# Patient Record
Sex: Male | Born: 1935
Health system: Southern US, Community
[De-identification: ages and names within clinical notes are randomized; demographics above are authoritative.]

## PROBLEM LIST (undated history)

## (undated) ENCOUNTER — Emergency Department (HOSPITAL_COMMUNITY): Payer: Medicare Other

## (undated) DIAGNOSIS — S0990XA Unspecified injury of head, initial encounter: Secondary | ICD-10-CM

## (undated) DIAGNOSIS — F32A Depression, unspecified: Secondary | ICD-10-CM

## (undated) DIAGNOSIS — J301 Allergic rhinitis due to pollen: Secondary | ICD-10-CM

## (undated) DIAGNOSIS — I639 Cerebral infarction, unspecified: Secondary | ICD-10-CM

## (undated) DIAGNOSIS — M8580 Other specified disorders of bone density and structure, unspecified site: Secondary | ICD-10-CM

## (undated) DIAGNOSIS — L309 Dermatitis, unspecified: Secondary | ICD-10-CM

## (undated) DIAGNOSIS — R040 Epistaxis: Secondary | ICD-10-CM

## (undated) DIAGNOSIS — E039 Hypothyroidism, unspecified: Secondary | ICD-10-CM

## (undated) DIAGNOSIS — N529 Male erectile dysfunction, unspecified: Secondary | ICD-10-CM

## (undated) DIAGNOSIS — C801 Malignant (primary) neoplasm, unspecified: Secondary | ICD-10-CM

## (undated) DIAGNOSIS — G4700 Insomnia, unspecified: Secondary | ICD-10-CM

## (undated) DIAGNOSIS — M069 Rheumatoid arthritis, unspecified: Secondary | ICD-10-CM

## (undated) DIAGNOSIS — I10 Essential (primary) hypertension: Secondary | ICD-10-CM

## (undated) DIAGNOSIS — F329 Major depressive disorder, single episode, unspecified: Secondary | ICD-10-CM

## (undated) DIAGNOSIS — E785 Hyperlipidemia, unspecified: Secondary | ICD-10-CM

## (undated) HISTORY — PX: INGUINAL HERNIA REPAIR: SHX194

## (undated) HISTORY — DX: Essential (primary) hypertension: I10

## (undated) HISTORY — PX: COLONOSCOPY: SHX174

## (undated) HISTORY — DX: Epistaxis: R04.0

## (undated) HISTORY — DX: Allergic rhinitis due to pollen: J30.1

## (undated) HISTORY — DX: Hypothyroidism, unspecified: E03.9

## (undated) HISTORY — DX: Insomnia, unspecified: G47.00

## (undated) HISTORY — PX: COLON SURGERY: SHX602

## (undated) HISTORY — DX: Other specified disorders of bone density and structure, unspecified site: M85.80

## (undated) HISTORY — DX: Rheumatoid arthritis, unspecified: M06.9

## (undated) HISTORY — DX: Male erectile dysfunction, unspecified: N52.9

## (undated) HISTORY — DX: Malignant (primary) neoplasm, unspecified: C80.1

## (undated) HISTORY — DX: Dermatitis, unspecified: L30.9

---

## 1983-05-23 DIAGNOSIS — C801 Malignant (primary) neoplasm, unspecified: Secondary | ICD-10-CM

## 1983-05-23 HISTORY — DX: Malignant (primary) neoplasm, unspecified: C80.1

## 2001-10-20 ENCOUNTER — Encounter (INDEPENDENT_AMBULATORY_CARE_PROVIDER_SITE_OTHER): Payer: Self-pay | Admitting: *Deleted

## 2002-05-27 ENCOUNTER — Ambulatory Visit (HOSPITAL_COMMUNITY): Admission: RE | Admit: 2002-05-27 | Discharge: 2002-05-27 | Payer: Self-pay | Admitting: Surgery

## 2002-05-28 ENCOUNTER — Encounter: Payer: Self-pay | Admitting: Surgery

## 2002-05-28 ENCOUNTER — Ambulatory Visit (HOSPITAL_COMMUNITY): Admission: RE | Admit: 2002-05-28 | Discharge: 2002-05-28 | Payer: Self-pay | Admitting: Surgery

## 2002-06-05 ENCOUNTER — Encounter (INDEPENDENT_AMBULATORY_CARE_PROVIDER_SITE_OTHER): Payer: Self-pay | Admitting: *Deleted

## 2002-06-05 ENCOUNTER — Encounter (INDEPENDENT_AMBULATORY_CARE_PROVIDER_SITE_OTHER): Payer: Self-pay | Admitting: Specialist

## 2002-06-05 ENCOUNTER — Inpatient Hospital Stay (HOSPITAL_COMMUNITY): Admission: RE | Admit: 2002-06-05 | Discharge: 2002-06-11 | Payer: Self-pay | Admitting: Surgery

## 2002-06-08 ENCOUNTER — Encounter: Payer: Self-pay | Admitting: Surgery

## 2002-06-11 ENCOUNTER — Encounter (INDEPENDENT_AMBULATORY_CARE_PROVIDER_SITE_OTHER): Payer: Self-pay | Admitting: *Deleted

## 2002-07-21 ENCOUNTER — Encounter: Payer: Self-pay | Admitting: Surgery

## 2002-07-21 ENCOUNTER — Encounter (INDEPENDENT_AMBULATORY_CARE_PROVIDER_SITE_OTHER): Payer: Self-pay | Admitting: *Deleted

## 2002-07-21 ENCOUNTER — Ambulatory Visit (HOSPITAL_BASED_OUTPATIENT_CLINIC_OR_DEPARTMENT_OTHER): Admission: RE | Admit: 2002-07-21 | Discharge: 2002-07-21 | Payer: Self-pay | Admitting: Surgery

## 2002-08-18 ENCOUNTER — Encounter (INDEPENDENT_AMBULATORY_CARE_PROVIDER_SITE_OTHER): Payer: Self-pay | Admitting: *Deleted

## 2002-08-18 ENCOUNTER — Observation Stay (HOSPITAL_COMMUNITY): Admission: RE | Admit: 2002-08-18 | Discharge: 2002-08-19 | Payer: Self-pay | Admitting: Surgery

## 2003-02-17 ENCOUNTER — Ambulatory Visit (HOSPITAL_COMMUNITY): Admission: RE | Admit: 2003-02-17 | Discharge: 2003-02-17 | Payer: Self-pay | Admitting: Oncology

## 2003-02-17 ENCOUNTER — Encounter: Payer: Self-pay | Admitting: Oncology

## 2003-05-29 ENCOUNTER — Ambulatory Visit (HOSPITAL_COMMUNITY): Admission: RE | Admit: 2003-05-29 | Discharge: 2003-05-29 | Payer: Self-pay | Admitting: Oncology

## 2003-07-17 ENCOUNTER — Ambulatory Visit (HOSPITAL_COMMUNITY): Admission: RE | Admit: 2003-07-17 | Discharge: 2003-07-17 | Payer: Self-pay | Admitting: Surgery

## 2003-07-17 ENCOUNTER — Encounter (INDEPENDENT_AMBULATORY_CARE_PROVIDER_SITE_OTHER): Payer: Self-pay | Admitting: *Deleted

## 2003-11-19 ENCOUNTER — Ambulatory Visit (HOSPITAL_COMMUNITY): Admission: RE | Admit: 2003-11-19 | Discharge: 2003-11-19 | Payer: Self-pay | Admitting: Oncology

## 2003-12-22 ENCOUNTER — Ambulatory Visit (HOSPITAL_BASED_OUTPATIENT_CLINIC_OR_DEPARTMENT_OTHER): Admission: RE | Admit: 2003-12-22 | Discharge: 2003-12-22 | Payer: Self-pay | Admitting: Surgery

## 2004-01-09 ENCOUNTER — Encounter (INDEPENDENT_AMBULATORY_CARE_PROVIDER_SITE_OTHER): Payer: Self-pay | Admitting: *Deleted

## 2004-05-18 ENCOUNTER — Ambulatory Visit: Payer: Self-pay | Admitting: Oncology

## 2004-05-19 ENCOUNTER — Ambulatory Visit (HOSPITAL_COMMUNITY): Admission: RE | Admit: 2004-05-19 | Discharge: 2004-05-19 | Payer: Self-pay | Admitting: Oncology

## 2004-11-28 ENCOUNTER — Ambulatory Visit: Payer: Self-pay | Admitting: Oncology

## 2005-05-26 ENCOUNTER — Ambulatory Visit: Payer: Self-pay | Admitting: Oncology

## 2005-08-01 ENCOUNTER — Ambulatory Visit: Payer: Self-pay | Admitting: Internal Medicine

## 2005-09-08 ENCOUNTER — Ambulatory Visit: Payer: Self-pay | Admitting: Internal Medicine

## 2005-11-06 ENCOUNTER — Ambulatory Visit: Payer: Self-pay | Admitting: Internal Medicine

## 2005-11-10 ENCOUNTER — Ambulatory Visit: Payer: Self-pay | Admitting: Internal Medicine

## 2005-11-15 ENCOUNTER — Ambulatory Visit: Payer: Self-pay

## 2005-11-20 ENCOUNTER — Ambulatory Visit: Payer: Self-pay | Admitting: Oncology

## 2005-11-27 LAB — CEA: CEA: 2 ng/mL (ref 0.0–5.0)

## 2005-12-29 ENCOUNTER — Ambulatory Visit: Payer: Self-pay | Admitting: Internal Medicine

## 2006-01-08 ENCOUNTER — Ambulatory Visit: Payer: Self-pay

## 2006-01-08 ENCOUNTER — Encounter: Payer: Self-pay | Admitting: Cardiovascular Disease

## 2006-05-23 ENCOUNTER — Ambulatory Visit: Payer: Self-pay | Admitting: Oncology

## 2006-05-28 LAB — CEA: CEA: 1.9 ng/mL (ref 0.0–5.0)

## 2006-06-06 ENCOUNTER — Ambulatory Visit: Payer: Self-pay | Admitting: Internal Medicine

## 2006-06-08 ENCOUNTER — Ambulatory Visit: Payer: Self-pay | Admitting: Cardiovascular Disease

## 2006-06-21 ENCOUNTER — Ambulatory Visit: Payer: Self-pay | Admitting: Internal Medicine

## 2006-08-28 ENCOUNTER — Encounter: Payer: Self-pay | Admitting: Internal Medicine

## 2006-08-28 ENCOUNTER — Ambulatory Visit: Payer: Self-pay | Admitting: Internal Medicine

## 2006-08-28 DIAGNOSIS — J301 Allergic rhinitis due to pollen: Secondary | ICD-10-CM | POA: Insufficient documentation

## 2006-08-28 DIAGNOSIS — E039 Hypothyroidism, unspecified: Secondary | ICD-10-CM | POA: Insufficient documentation

## 2006-08-28 DIAGNOSIS — G4700 Insomnia, unspecified: Secondary | ICD-10-CM | POA: Insufficient documentation

## 2006-08-28 DIAGNOSIS — I10 Essential (primary) hypertension: Secondary | ICD-10-CM | POA: Insufficient documentation

## 2006-08-28 LAB — CONVERTED CEMR LAB
Cholesterol: 180 mg/dL (ref 0–200)
Direct LDL: 67.8 mg/dL
HDL: 36.2 mg/dL — ABNORMAL LOW (ref >39.0)
PSA: 2.7 ng/mL (ref 0.10–4.00)
TSH: 1.15 microintl units/mL (ref 0.35–5.50)
Total CHOL/HDL Ratio: 5
Triglycerides: 437 mg/dL (ref 0–149)
VLDL: 87 mg/dL — ABNORMAL HIGH (ref 0–40)

## 2006-10-08 ENCOUNTER — Ambulatory Visit (HOSPITAL_COMMUNITY): Admission: RE | Admit: 2006-10-08 | Discharge: 2006-10-08 | Payer: Self-pay | Admitting: Surgery

## 2006-10-08 ENCOUNTER — Encounter (INDEPENDENT_AMBULATORY_CARE_PROVIDER_SITE_OTHER): Payer: Self-pay | Admitting: *Deleted

## 2006-10-08 LAB — HM COLONOSCOPY: HM Colonoscopy: NORMAL

## 2006-10-09 ENCOUNTER — Encounter: Payer: Self-pay | Admitting: Internal Medicine

## 2006-10-09 ENCOUNTER — Ambulatory Visit: Payer: Self-pay | Admitting: Internal Medicine

## 2006-10-09 DIAGNOSIS — R252 Cramp and spasm: Secondary | ICD-10-CM | POA: Insufficient documentation

## 2006-10-18 ENCOUNTER — Telehealth (INDEPENDENT_AMBULATORY_CARE_PROVIDER_SITE_OTHER): Payer: Self-pay | Admitting: *Deleted

## 2006-11-21 ENCOUNTER — Ambulatory Visit: Payer: Self-pay | Admitting: Oncology

## 2006-11-26 LAB — CEA: CEA: 1.6 ng/mL (ref 0.0–5.0)

## 2006-11-27 ENCOUNTER — Encounter: Payer: Self-pay | Admitting: Internal Medicine

## 2006-12-07 ENCOUNTER — Ambulatory Visit: Payer: Self-pay | Admitting: Internal Medicine

## 2006-12-07 DIAGNOSIS — L259 Unspecified contact dermatitis, unspecified cause: Secondary | ICD-10-CM | POA: Insufficient documentation

## 2007-01-02 ENCOUNTER — Telehealth (INDEPENDENT_AMBULATORY_CARE_PROVIDER_SITE_OTHER): Payer: Self-pay | Admitting: *Deleted

## 2007-01-07 ENCOUNTER — Telehealth: Payer: Self-pay | Admitting: Internal Medicine

## 2007-01-08 ENCOUNTER — Telehealth (INDEPENDENT_AMBULATORY_CARE_PROVIDER_SITE_OTHER): Payer: Self-pay | Admitting: *Deleted

## 2007-02-12 ENCOUNTER — Encounter: Payer: Self-pay | Admitting: Internal Medicine

## 2007-02-19 ENCOUNTER — Encounter: Payer: Self-pay | Admitting: Internal Medicine

## 2007-02-22 ENCOUNTER — Ambulatory Visit: Payer: Self-pay | Admitting: Internal Medicine

## 2007-02-22 DIAGNOSIS — F528 Other sexual dysfunction not due to a substance or known physiological condition: Secondary | ICD-10-CM | POA: Insufficient documentation

## 2007-04-03 ENCOUNTER — Ambulatory Visit: Payer: Self-pay | Admitting: Internal Medicine

## 2007-04-08 ENCOUNTER — Ambulatory Visit: Payer: Self-pay | Admitting: Internal Medicine

## 2007-04-15 ENCOUNTER — Encounter: Payer: Self-pay | Admitting: Family Medicine

## 2007-04-23 ENCOUNTER — Ambulatory Visit: Payer: Self-pay | Admitting: Internal Medicine

## 2007-04-25 LAB — CONVERTED CEMR LAB
BUN: 9 mg/dL (ref 6–23)
CO2: 29 meq/L (ref 19–32)
Calcium: 9.4 mg/dL (ref 8.4–10.5)
Chloride: 93 meq/L — ABNORMAL LOW (ref 96–112)
Creatinine, Ser: 1.3 mg/dL (ref 0.4–1.5)
GFR calc Af Amer: 69 mL/min
GFR calc non Af Amer: 57 mL/min
Glucose, Bld: 133 mg/dL — ABNORMAL HIGH (ref 70–99)
Potassium: 4 meq/L (ref 3.5–5.1)
Sodium: 133 meq/L — ABNORMAL LOW (ref 135–145)

## 2007-05-28 ENCOUNTER — Ambulatory Visit: Payer: Self-pay | Admitting: Oncology

## 2007-05-30 LAB — CEA: CEA: 1.9 ng/mL (ref 0.0–5.0)

## 2007-06-05 ENCOUNTER — Encounter: Payer: Self-pay | Admitting: Internal Medicine

## 2007-06-27 ENCOUNTER — Ambulatory Visit: Payer: Self-pay | Admitting: Internal Medicine

## 2007-06-28 LAB — CONVERTED CEMR LAB
Cholesterol: 181 mg/dL (ref 0–200)
Direct LDL: 74 mg/dL
HDL: 39 mg/dL (ref >39.0)
TSH: 3.53 microintl units/mL (ref 0.35–5.50)
Total CHOL/HDL Ratio: 4.6
Triglycerides: 294 mg/dL (ref 0–149)
VLDL: 59 mg/dL — ABNORMAL HIGH (ref 0–40)

## 2007-07-26 ENCOUNTER — Telehealth (INDEPENDENT_AMBULATORY_CARE_PROVIDER_SITE_OTHER): Payer: Self-pay | Admitting: *Deleted

## 2007-07-26 ENCOUNTER — Ambulatory Visit: Payer: Self-pay | Admitting: Family Medicine

## 2007-07-26 DIAGNOSIS — R040 Epistaxis: Secondary | ICD-10-CM | POA: Insufficient documentation

## 2007-07-30 ENCOUNTER — Encounter: Payer: Self-pay | Admitting: Internal Medicine

## 2007-08-15 ENCOUNTER — Encounter: Payer: Self-pay | Admitting: Internal Medicine

## 2007-09-10 ENCOUNTER — Encounter: Payer: Self-pay | Admitting: Internal Medicine

## 2007-09-26 ENCOUNTER — Encounter: Payer: Self-pay | Admitting: Internal Medicine

## 2007-10-23 ENCOUNTER — Telehealth (INDEPENDENT_AMBULATORY_CARE_PROVIDER_SITE_OTHER): Payer: Self-pay | Admitting: *Deleted

## 2007-10-24 ENCOUNTER — Encounter (INDEPENDENT_AMBULATORY_CARE_PROVIDER_SITE_OTHER): Payer: Self-pay | Admitting: *Deleted

## 2007-10-30 ENCOUNTER — Ambulatory Visit: Payer: Self-pay | Admitting: Internal Medicine

## 2007-10-31 LAB — CONVERTED CEMR LAB
BUN: 13 mg/dL (ref 6–23)
CO2: 31 meq/L (ref 19–32)
Calcium: 9.5 mg/dL (ref 8.4–10.5)
Chloride: 101 meq/L (ref 96–112)
Creatinine, Ser: 1.1 mg/dL (ref 0.4–1.5)
GFR calc Af Amer: 83 mL/min
GFR calc non Af Amer: 69 mL/min
Glucose, Bld: 89 mg/dL (ref 70–99)
Potassium: 4.5 meq/L (ref 3.5–5.1)
Sodium: 139 meq/L (ref 135–145)
TSH: 0.95 microintl units/mL (ref 0.35–5.50)

## 2007-11-15 ENCOUNTER — Telehealth: Payer: Self-pay | Admitting: Internal Medicine

## 2007-11-25 ENCOUNTER — Ambulatory Visit: Payer: Self-pay | Admitting: Oncology

## 2007-12-05 LAB — CEA: CEA: 1.7 ng/mL (ref 0.0–5.0)

## 2008-01-07 ENCOUNTER — Telehealth (INDEPENDENT_AMBULATORY_CARE_PROVIDER_SITE_OTHER): Payer: Self-pay | Admitting: *Deleted

## 2008-01-10 ENCOUNTER — Ambulatory Visit: Payer: Self-pay | Admitting: Internal Medicine

## 2008-02-26 ENCOUNTER — Ambulatory Visit: Payer: Self-pay | Admitting: Internal Medicine

## 2008-02-28 ENCOUNTER — Ambulatory Visit: Payer: Self-pay | Admitting: Internal Medicine

## 2008-03-02 ENCOUNTER — Ambulatory Visit: Payer: Self-pay | Admitting: Internal Medicine

## 2008-03-03 ENCOUNTER — Telehealth (INDEPENDENT_AMBULATORY_CARE_PROVIDER_SITE_OTHER): Payer: Self-pay | Admitting: *Deleted

## 2008-05-20 ENCOUNTER — Ambulatory Visit: Payer: Self-pay | Admitting: Internal Medicine

## 2008-05-27 ENCOUNTER — Ambulatory Visit: Payer: Self-pay | Admitting: Oncology

## 2008-05-29 ENCOUNTER — Encounter: Payer: Self-pay | Admitting: Internal Medicine

## 2008-05-29 LAB — CEA: CEA: 2 ng/mL (ref 0.0–5.0)

## 2008-07-17 ENCOUNTER — Ambulatory Visit: Payer: Self-pay | Admitting: Internal Medicine

## 2008-07-23 ENCOUNTER — Ambulatory Visit: Payer: Self-pay | Admitting: Internal Medicine

## 2008-07-23 DIAGNOSIS — E781 Pure hyperglyceridemia: Secondary | ICD-10-CM | POA: Insufficient documentation

## 2008-07-28 ENCOUNTER — Telehealth (INDEPENDENT_AMBULATORY_CARE_PROVIDER_SITE_OTHER): Payer: Self-pay | Admitting: *Deleted

## 2008-07-28 ENCOUNTER — Ambulatory Visit: Payer: Self-pay | Admitting: Internal Medicine

## 2008-07-28 ENCOUNTER — Encounter: Payer: Self-pay | Admitting: Internal Medicine

## 2008-07-28 LAB — CONVERTED CEMR LAB
ALT: 68 units/L — ABNORMAL HIGH (ref 0–53)
AST: 46 units/L — ABNORMAL HIGH (ref 0–37)
BUN: 16 mg/dL (ref 6–23)
Basophils Absolute: 0.1 10*3/uL (ref 0.0–0.1)
Basophils Relative: 0.7 % (ref 0.0–3.0)
CO2: 32 meq/L (ref 19–32)
Calcium: 9.5 mg/dL (ref 8.4–10.5)
Chloride: 103 meq/L (ref 96–112)
Cholesterol: 210 mg/dL (ref 0–200)
Creatinine, Ser: 1.2 mg/dL (ref 0.4–1.5)
Direct LDL: 96.6 mg/dL
Eosinophils Absolute: 0.2 10*3/uL (ref 0.0–0.7)
Eosinophils Relative: 2.3 % (ref 0.0–5.0)
GFR calc Af Amer: 75 mL/min
GFR calc non Af Amer: 62 mL/min
Glucose, Bld: 89 mg/dL (ref 70–99)
HCT: 44.8 % (ref 39.0–52.0)
HDL: 36.7 mg/dL — ABNORMAL LOW (ref >39.0)
Hemoglobin: 15.5 g/dL (ref 13.0–17.0)
Lymphocytes Relative: 23.9 % (ref 12.0–46.0)
MCHC: 34.6 g/dL (ref 30.0–36.0)
MCV: 89.3 fL (ref 78.0–100.0)
Monocytes Absolute: 0.8 10*3/uL (ref 0.1–1.0)
Monocytes Relative: 8.8 % (ref 3.0–12.0)
Neutro Abs: 5.7 10*3/uL (ref 1.4–7.7)
Neutrophils Relative %: 64.3 % (ref 43.0–77.0)
PSA: 2.61 ng/mL (ref 0.10–4.00)
Platelets: 217 10*3/uL (ref 150–400)
Potassium: 4.4 meq/L (ref 3.5–5.1)
RBC: 5.02 M/uL (ref 4.22–5.81)
RDW: 11.9 % (ref 11.5–14.6)
Sodium: 141 meq/L (ref 135–145)
TSH: 0.9 microintl units/mL (ref 0.35–5.50)
Total CHOL/HDL Ratio: 5.7
Triglycerides: 334 mg/dL (ref 0–149)
VLDL: 67 mg/dL — ABNORMAL HIGH (ref 0–40)
WBC: 8.9 10*3/uL (ref 4.5–10.5)

## 2008-07-29 ENCOUNTER — Ambulatory Visit: Payer: Self-pay

## 2008-07-29 ENCOUNTER — Encounter: Payer: Self-pay | Admitting: Internal Medicine

## 2008-08-19 ENCOUNTER — Telehealth: Payer: Self-pay | Admitting: Internal Medicine

## 2008-08-19 ENCOUNTER — Ambulatory Visit: Payer: Self-pay | Admitting: Internal Medicine

## 2008-08-25 LAB — CONVERTED CEMR LAB
ALT: 21 units/L (ref 0–53)
AST: 22 units/L (ref 0–37)
Albumin: 3.6 g/dL (ref 3.5–5.2)
Alkaline Phosphatase: 68 units/L (ref 39–117)
Bilirubin, Direct: 0.1 mg/dL (ref 0.0–0.3)
Total Bilirubin: 0.7 mg/dL (ref 0.3–1.2)
Total Protein: 6.9 g/dL (ref 6.0–8.3)

## 2008-08-26 ENCOUNTER — Telehealth (INDEPENDENT_AMBULATORY_CARE_PROVIDER_SITE_OTHER): Payer: Self-pay | Admitting: *Deleted

## 2008-08-26 ENCOUNTER — Ambulatory Visit: Payer: Self-pay | Admitting: Internal Medicine

## 2008-08-26 DIAGNOSIS — M949 Disorder of cartilage, unspecified: Secondary | ICD-10-CM

## 2008-08-26 DIAGNOSIS — M899 Disorder of bone, unspecified: Secondary | ICD-10-CM | POA: Insufficient documentation

## 2008-11-19 ENCOUNTER — Ambulatory Visit (HOSPITAL_COMMUNITY): Admission: RE | Admit: 2008-11-19 | Discharge: 2008-11-19 | Payer: Self-pay | Admitting: Orthopedic Surgery

## 2008-12-04 ENCOUNTER — Ambulatory Visit: Payer: Self-pay | Admitting: Internal Medicine

## 2008-12-04 DIAGNOSIS — R498 Other voice and resonance disorders: Secondary | ICD-10-CM | POA: Insufficient documentation

## 2009-01-04 ENCOUNTER — Ambulatory Visit: Payer: Self-pay | Admitting: Internal Medicine

## 2009-01-04 DIAGNOSIS — R5381 Other malaise: Secondary | ICD-10-CM | POA: Insufficient documentation

## 2009-01-04 DIAGNOSIS — R221 Localized swelling, mass and lump, neck: Secondary | ICD-10-CM

## 2009-01-04 DIAGNOSIS — R220 Localized swelling, mass and lump, head: Secondary | ICD-10-CM | POA: Insufficient documentation

## 2009-01-04 DIAGNOSIS — R5383 Other fatigue: Secondary | ICD-10-CM

## 2009-01-06 ENCOUNTER — Encounter: Payer: Self-pay | Admitting: Internal Medicine

## 2009-01-07 LAB — CONVERTED CEMR LAB
BUN: 13 mg/dL (ref 6–23)
Basophils Absolute: 0 10*3/uL (ref 0.0–0.1)
Basophils Relative: 0.1 % (ref 0.0–3.0)
CO2: 28 meq/L (ref 19–32)
Calcium: 9 mg/dL (ref 8.4–10.5)
Chloride: 105 meq/L (ref 96–112)
Creatinine, Ser: 0.9 mg/dL (ref 0.4–1.5)
Eosinophils Absolute: 0.1 10*3/uL (ref 0.0–0.7)
Eosinophils Relative: 0.9 % (ref 0.0–5.0)
Folate: 14.9 ng/mL
GFR calc non Af Amer: 86.61 mL/min (ref >60)
Glucose, Bld: 79 mg/dL (ref 70–99)
HCT: 42.9 % (ref 39.0–52.0)
Hemoglobin: 14.1 g/dL (ref 13.0–17.0)
Lymphocytes Relative: 25.5 % (ref 12.0–46.0)
Lymphs Abs: 2.2 10*3/uL (ref 0.7–4.0)
MCHC: 32.9 g/dL (ref 30.0–36.0)
MCV: 90.4 fL (ref 78.0–100.0)
Monocytes Absolute: 0.8 10*3/uL (ref 0.1–1.0)
Monocytes Relative: 8.6 % (ref 3.0–12.0)
Neutro Abs: 5.7 10*3/uL (ref 1.4–7.7)
Neutrophils Relative %: 64.9 % (ref 43.0–77.0)
Platelets: 192 10*3/uL (ref 150.0–400.0)
Potassium: 3.9 meq/L (ref 3.5–5.1)
RBC: 4.75 M/uL (ref 4.22–5.81)
RDW: 12.3 % (ref 11.5–14.6)
Sodium: 139 meq/L (ref 135–145)
Vitamin B-12: 364 pg/mL (ref 211–911)
WBC: 8.8 10*3/uL (ref 4.5–10.5)

## 2009-01-18 ENCOUNTER — Telehealth (INDEPENDENT_AMBULATORY_CARE_PROVIDER_SITE_OTHER): Payer: Self-pay | Admitting: *Deleted

## 2009-01-19 ENCOUNTER — Telehealth (INDEPENDENT_AMBULATORY_CARE_PROVIDER_SITE_OTHER): Payer: Self-pay | Admitting: *Deleted

## 2009-01-26 ENCOUNTER — Telehealth (INDEPENDENT_AMBULATORY_CARE_PROVIDER_SITE_OTHER): Payer: Self-pay | Admitting: *Deleted

## 2009-02-03 ENCOUNTER — Encounter (INDEPENDENT_AMBULATORY_CARE_PROVIDER_SITE_OTHER): Payer: Self-pay | Admitting: *Deleted

## 2009-02-15 ENCOUNTER — Ambulatory Visit: Payer: Self-pay | Admitting: Internal Medicine

## 2009-04-05 ENCOUNTER — Ambulatory Visit: Payer: Self-pay | Admitting: Internal Medicine

## 2009-04-05 DIAGNOSIS — I059 Rheumatic mitral valve disease, unspecified: Secondary | ICD-10-CM | POA: Insufficient documentation

## 2009-05-28 ENCOUNTER — Ambulatory Visit: Payer: Self-pay | Admitting: Oncology

## 2009-05-28 ENCOUNTER — Encounter: Payer: Self-pay | Admitting: Internal Medicine

## 2009-05-28 LAB — CEA: CEA: 2.3 ng/mL (ref 0.0–5.0)

## 2009-08-19 ENCOUNTER — Telehealth (INDEPENDENT_AMBULATORY_CARE_PROVIDER_SITE_OTHER): Payer: Self-pay | Admitting: *Deleted

## 2009-09-01 ENCOUNTER — Encounter: Payer: Self-pay | Admitting: Internal Medicine

## 2009-09-13 ENCOUNTER — Ambulatory Visit (HOSPITAL_COMMUNITY): Admission: RE | Admit: 2009-09-13 | Discharge: 2009-09-13 | Payer: Self-pay | Admitting: Surgery

## 2009-10-22 ENCOUNTER — Telehealth (INDEPENDENT_AMBULATORY_CARE_PROVIDER_SITE_OTHER): Payer: Self-pay | Admitting: *Deleted

## 2009-11-15 ENCOUNTER — Ambulatory Visit: Payer: Self-pay | Admitting: Internal Medicine

## 2009-11-15 DIAGNOSIS — R05 Cough: Secondary | ICD-10-CM | POA: Insufficient documentation

## 2009-11-15 DIAGNOSIS — R059 Cough, unspecified: Secondary | ICD-10-CM | POA: Insufficient documentation

## 2009-11-15 LAB — CONVERTED CEMR LAB: Vit D, 25-Hydroxy: 43 ng/mL (ref 30–89)

## 2009-11-19 LAB — CONVERTED CEMR LAB
Folate: 12.6 ng/mL
PSA: 2.59 ng/mL (ref 0.10–4.00)
TSH: 1 microintl units/mL (ref 0.35–5.50)
Vitamin B-12: 273 pg/mL (ref 211–911)

## 2009-12-06 ENCOUNTER — Ambulatory Visit: Payer: Self-pay | Admitting: Internal Medicine

## 2009-12-14 LAB — CONVERTED CEMR LAB
BUN: 17 mg/dL (ref 6–23)
CO2: 31 meq/L (ref 19–32)
Calcium: 9.2 mg/dL (ref 8.4–10.5)
Chloride: 107 meq/L (ref 96–112)
Creatinine, Ser: 1 mg/dL (ref 0.4–1.5)
GFR calc non Af Amer: 76.51 mL/min (ref >60)
Glucose, Bld: 104 mg/dL — ABNORMAL HIGH (ref 70–99)
Potassium: 4.2 meq/L (ref 3.5–5.1)
Sodium: 140 meq/L (ref 135–145)

## 2010-02-01 ENCOUNTER — Ambulatory Visit: Payer: Self-pay | Admitting: Family Medicine

## 2010-02-14 ENCOUNTER — Telehealth (INDEPENDENT_AMBULATORY_CARE_PROVIDER_SITE_OTHER): Payer: Self-pay | Admitting: *Deleted

## 2010-02-17 ENCOUNTER — Telehealth: Payer: Self-pay | Admitting: Internal Medicine

## 2010-03-17 ENCOUNTER — Ambulatory Visit: Payer: Self-pay | Admitting: Internal Medicine

## 2010-03-17 LAB — CONVERTED CEMR LAB
Bilirubin Urine: NEGATIVE
Blood in Urine, dipstick: NEGATIVE
Glucose, Urine, Semiquant: NEGATIVE
Ketones, urine, test strip: NEGATIVE
Nitrite: NEGATIVE
Protein, U semiquant: NEGATIVE
Specific Gravity, Urine: 1.01
Urobilinogen, UA: 0.2
WBC Urine, dipstick: NEGATIVE
pH: 7.5

## 2010-03-23 LAB — CONVERTED CEMR LAB
ALT: 20 units/L (ref 0–53)
AST: 26 units/L (ref 0–37)
Albumin: 3.9 g/dL (ref 3.5–5.2)
Alkaline Phosphatase: 68 units/L (ref 39–117)
Amylase: 168 units/L — ABNORMAL HIGH (ref 27–131)
Basophils Absolute: 0 10*3/uL (ref 0.0–0.1)
Basophils Relative: 0.4 % (ref 0.0–3.0)
Bilirubin, Direct: 0.1 mg/dL (ref 0.0–0.3)
Eosinophils Absolute: 0.1 10*3/uL (ref 0.0–0.7)
Eosinophils Relative: 1.4 % (ref 0.0–5.0)
HCT: 43.5 % (ref 39.0–52.0)
Hemoglobin: 14.9 g/dL (ref 13.0–17.0)
Lipase: 38 units/L (ref 11.0–59.0)
Lymphocytes Relative: 23.9 % (ref 12.0–46.0)
Lymphs Abs: 2 10*3/uL (ref 0.7–4.0)
MCHC: 34.3 g/dL (ref 30.0–36.0)
MCV: 91 fL (ref 78.0–100.0)
Monocytes Absolute: 0.6 10*3/uL (ref 0.1–1.0)
Monocytes Relative: 7.5 % (ref 3.0–12.0)
Neutro Abs: 5.7 10*3/uL (ref 1.4–7.7)
Neutrophils Relative %: 66.8 % (ref 43.0–77.0)
Platelets: 210 10*3/uL (ref 150.0–400.0)
RBC: 4.78 M/uL (ref 4.22–5.81)
RDW: 12.6 % (ref 11.5–14.6)
TSH: 0.68 microintl units/mL (ref 0.35–5.50)
Total Bilirubin: 0.5 mg/dL (ref 0.3–1.2)
Total Protein: 6.8 g/dL (ref 6.0–8.3)
WBC: 8.5 10*3/uL (ref 4.5–10.5)

## 2010-04-01 ENCOUNTER — Encounter: Payer: Self-pay | Admitting: Internal Medicine

## 2010-04-01 ENCOUNTER — Telehealth: Payer: Self-pay | Admitting: Cardiovascular Disease

## 2010-04-04 ENCOUNTER — Ambulatory Visit: Payer: Self-pay

## 2010-04-04 ENCOUNTER — Encounter: Payer: Self-pay | Admitting: Internal Medicine

## 2010-04-08 ENCOUNTER — Ambulatory Visit: Payer: Self-pay | Admitting: Internal Medicine

## 2010-04-11 ENCOUNTER — Telehealth (INDEPENDENT_AMBULATORY_CARE_PROVIDER_SITE_OTHER): Payer: Self-pay | Admitting: *Deleted

## 2010-04-21 ENCOUNTER — Encounter: Payer: Self-pay | Admitting: Internal Medicine

## 2010-04-21 ENCOUNTER — Encounter (INDEPENDENT_AMBULATORY_CARE_PROVIDER_SITE_OTHER): Payer: Self-pay | Admitting: *Deleted

## 2010-05-18 ENCOUNTER — Ambulatory Visit: Payer: Self-pay | Admitting: Internal Medicine

## 2010-05-18 DIAGNOSIS — Z85038 Personal history of other malignant neoplasm of large intestine: Secondary | ICD-10-CM | POA: Insufficient documentation

## 2010-05-19 ENCOUNTER — Ambulatory Visit
Admission: RE | Admit: 2010-05-19 | Discharge: 2010-05-19 | Payer: Self-pay | Source: Home / Self Care | Attending: Internal Medicine | Admitting: Internal Medicine

## 2010-05-19 ENCOUNTER — Encounter: Payer: Self-pay | Admitting: Internal Medicine

## 2010-05-20 ENCOUNTER — Ambulatory Visit: Payer: Self-pay | Admitting: Oncology

## 2010-05-20 ENCOUNTER — Telehealth: Payer: Self-pay | Admitting: Internal Medicine

## 2010-05-24 ENCOUNTER — Telehealth: Payer: Self-pay | Admitting: Gastroenterology

## 2010-05-25 ENCOUNTER — Telehealth: Payer: Self-pay | Admitting: Internal Medicine

## 2010-06-11 ENCOUNTER — Encounter: Payer: Self-pay | Admitting: Oncology

## 2010-06-12 ENCOUNTER — Encounter: Payer: Self-pay | Admitting: Internal Medicine

## 2010-06-13 ENCOUNTER — Encounter: Payer: Self-pay | Admitting: Orthopedic Surgery

## 2010-06-19 LAB — CONVERTED CEMR LAB
ALT: 17 units/L (ref 0–40)
AST: 27 units/L (ref 0–37)
AST: 31 units/L (ref 0–37)
Albumin: 3.8 g/dL (ref 3.5–5.2)
Alkaline Phosphatase: 73 units/L (ref 39–117)
BUN: 11 mg/dL (ref 6–23)
BUN: 14 mg/dL (ref 6–23)
Basophils Absolute: 0.1 10*3/uL (ref 0.0–0.1)
Basophils Relative: 0.7 % (ref 0.0–3.0)
CO2: 29 meq/L (ref 19–32)
CO2: 31 meq/L (ref 19–32)
Calcium: 8.5 mg/dL (ref 8.4–10.5)
Calcium: 9.7 mg/dL (ref 8.4–10.5)
Chloride: 101 meq/L (ref 96–112)
Chloride: 102 meq/L (ref 96–112)
Cholesterol: 204 mg/dL — ABNORMAL HIGH (ref 0–200)
Creatinine, Ser: 1 mg/dL (ref 0.4–1.5)
Creatinine, Ser: 1.1 mg/dL (ref 0.4–1.5)
Direct LDL: 78.4 mg/dL
Eosinophils Absolute: 0.2 10*3/uL (ref 0.0–0.7)
Eosinophils Relative: 2.3 % (ref 0.0–5.0)
GFR calc Af Amer: 84 mL/min
GFR calc non Af Amer: 69 mL/min
GFR calc non Af Amer: 76.64 mL/min (ref >60)
Glucose, Bld: 91 mg/dL (ref 70–99)
Glucose, Bld: 95 mg/dL (ref 70–99)
HCT: 44.3 % (ref 39.0–52.0)
HCT: 46 % (ref 39.0–52.0)
HDL: 38.9 mg/dL — ABNORMAL LOW (ref >39.00)
Hemoglobin: 15.1 g/dL (ref 13.0–17.0)
Hemoglobin: 15.3 g/dL (ref 13.0–17.0)
Lymphocytes Relative: 21.8 % (ref 12.0–46.0)
Lymphs Abs: 1.8 10*3/uL (ref 0.7–4.0)
MCHC: 33.3 g/dL (ref 30.0–36.0)
MCHC: 34.1 g/dL (ref 30.0–36.0)
MCV: 89.4 fL (ref 78.0–100.0)
MCV: 91.4 fL (ref 78.0–100.0)
Monocytes Absolute: 0.6 10*3/uL (ref 0.1–1.0)
Monocytes Relative: 7.5 % (ref 3.0–12.0)
Neutro Abs: 5.4 10*3/uL (ref 1.4–7.7)
Neutrophils Relative %: 67.7 % (ref 43.0–77.0)
Platelets: 191 10*3/uL (ref 150.0–400.0)
Platelets: 252 10*3/uL (ref 150–400)
Potassium: 4 meq/L (ref 3.5–5.1)
Potassium: 4 meq/L (ref 3.5–5.1)
RBC: 4.85 M/uL (ref 4.22–5.81)
RBC: 5.15 M/uL (ref 4.22–5.81)
RDW: 12.2 % (ref 11.5–14.6)
RDW: 12.4 % (ref 11.5–14.6)
Sodium: 138 meq/L (ref 135–145)
Sodium: 140 meq/L (ref 135–145)
TSH: 0.89 microintl units/mL (ref 0.35–5.50)
TSH: 6.81 microintl units/mL — ABNORMAL HIGH (ref 0.35–5.50)
Total Bilirubin: 0.5 mg/dL (ref 0.3–1.2)
Total CHOL/HDL Ratio: 5
Total Protein: 6.7 g/dL (ref 6.0–8.3)
Triglycerides: 461 mg/dL — ABNORMAL HIGH (ref 0.0–149.0)
VLDL: 92.2 mg/dL — ABNORMAL HIGH (ref 0.0–40.0)
WBC: 8.1 10*3/uL (ref 4.5–10.5)
WBC: 9.7 10*3/uL (ref 4.5–10.5)

## 2010-06-21 NOTE — Assessment & Plan Note (Signed)
Summary: paperwork//tl   Vital Signs:  Patient Profile:   77 Years Old Male Height:     62 inches Weight:      13.6 pounds Pulse rate:   80 / minute BP sitting:   142 / 74  Vitals Entered By: Shary Decamp (February 22, 2007 9:15 AM)                 Chief Complaint:  ED; rf on mometasone cream.  History of Present Illness: Here for eval of ED long h/o ED Used Viagra: didn't like s/e Contacted by a company, offered a Vaccum device, he is willing to try  Current Allergies: No known allergies  Updated/Current Medications (including changes made in today's visit):  DIOVAN 160 MG TABS (VALSARTAN) Take 1 tablet by mouth once a day SYNTHROID 100 MCG TABS (LEVOTHYROXINE SODIUM) Take 1 tablet by mouth once a day NEXIUM 40 MG  CPDR (ESOMEPRAZOLE MAGNESIUM) per ent ZYRTEC ALLERGY 10 MG  TABS (CETIRIZINE HCL) per ent MULTIVITAMINS   TABS (MULTIPLE VITAMIN)  * ALLERCLEAR  * GLUCOSAMINE  * OSTO-BI-FLEX  * OSCAL/D  FLONASE 50 MCG/ACT  SUSP (FLUTICASONE PROPIONATE) 2 sprays each nostil once daily prn CLOBETASOL PROPIONATE 0.05 %  CREA (CLOBETASOL PROPIONATE) apply for 10 days to both hands      Review of Systems       no cp no claudication   Physical Exam  General:     alert and well-developed.   Abdomen:     no bruit Pulses:     nl. femoral pulses Extremities:     no edema Skin:     hand eczema improved    Impression & Recommendations:  Problem # 1:  ERECTILE DYSFUNCTION (ICD-302.72) ok to Rx device  Problem # 2:  ECZEMA (ICD-692.9) rf His updated medication list for this problem includes:    Zyrtec Allergy 10 Mg Tabs (Cetirizine hcl) .Marland Kitchen... Per ent    Clobetasol Propionate 0.05 % Crea (Clobetasol propionate) .Marland Kitchen... Apply for 10 days to both hands   Problem # 3:  HYPERTENSION (ICD-401.9)  His updated medication list for this problem includes:    Diovan 160 Mg Tabs (Valsartan) .Marland Kitchen... Take 1 tablet by mouth once a day  BP today: 142/74 Prior BP: 150/90  (12/07/2006)  Labs Reviewed: Creat: 1.1 (06/06/2006) Chol: 180 (08/28/2006)   HDL: 36.2 (08/28/2006)   LDL: DEL (08/28/2006)   TG: 437 (08/28/2006)   Problem # 4:  HYPOTHYROIDISM (ICD-244.9)  His updated medication list for this problem includes:    Synthroid 100 Mcg Tabs (Levothyroxine sodium) .Marland Kitchen... Take 1 tablet by mouth once a day  Labs Reviewed: TSH: 1.15 (08/28/2006)    Chol: 180 (08/28/2006)   HDL: 36.2 (08/28/2006)   LDL: DEL (08/28/2006)   TG: 437 (08/28/2006)   Complete Medication List: 1)  Diovan 160 Mg Tabs (Valsartan) .... Take 1 tablet by mouth once a day 2)  Synthroid 100 Mcg Tabs (Levothyroxine sodium) .... Take 1 tablet by mouth once a day 3)  Nexium 40 Mg Cpdr (Esomeprazole magnesium) .... Per ent 4)  Zyrtec Allergy 10 Mg Tabs (Cetirizine hcl) .... Per ent 5)  Multivitamins Tabs (Multiple vitamin) 6)  Allerclear  7)  Glucosamine  8)  Osto-bi-flex  9)  Oscal/d  10)  Flonase 50 Mcg/act Susp (Fluticasone propionate) .... 2 sprays each nostil once daily prn 11)  Clobetasol Propionate 0.05 % Crea (Clobetasol propionate) .... Apply for 10 days to both hands   Patient Instructions: 1)  ask the company to fax another form for the vaccum 2)  use the cream for 2 more weeks, if the eczema came back we will send you to a dermatologist 3)  you are due for a routine office visit    Prescriptions: CLOBETASOL PROPIONATE 0.05 %  CREA (CLOBETASOL PROPIONATE) apply for 10 days to both hands  #1 x 0   Entered and Authorized by:   Nolon Rod. Tyee Vandevoorde MD   Signed by:   Nolon Rod. Benney Sommerville MD on 02/22/2007   Method used:   Print then Give to Patient   RxID:   [id number]  ]

## 2010-06-21 NOTE — Assessment & Plan Note (Signed)
Summary: a crack in both hands//tl  Medications Added DIOVAN 160 MG TABS (VALSARTAN) Take 1 tablet by mouth once a day SYNTHROID 100 MCG TABS (LEVOTHYROXINE SODIUM) Take 1 tablet by mouth once a day NEXIUM 40 MG  CPDR (ESOMEPRAZOLE MAGNESIUM) per ent ZYRTEC ALLERGY 10 MG  TABS (CETIRIZINE HCL) per ent MULTIVITAMINS   TABS (MULTIPLE VITAMIN)  * ALLERCLEAR  * GLUCOSAMINE  * OSTO-BI-FLEX  * OSCAL/D  FLONASE 50 MCG/ACT  SUSP (FLUTICASONE PROPIONATE) 2 sprays each nostil once daily prn CLOBETASOL PROPIONATE 0.05 %  CREA (CLOBETASOL PROPIONATE) apply for 10 days to both hands      Allergies Added: NKDA  Vital Signs:  Patient Profile:   77 Years Old Male Height:     62 inches Weight:      130 pounds Temp:     98.4 degrees F BP sitting:   150 / 90  (left arm) Cuff size:   regular  Vitals Entered By: Shary Decamp (December 07, 2006 11:52 AM)               Chief Complaint:  hands peeling.  History of Present Illness: rash on both hands for four weeks.  Denies any itching, some pain.  also complained all left groin and suprapubic pain, pain this started after he was examined at the cancer Center and they "push " that area  Current Allergies (reviewed today): No known allergies  Updated/Current Medications (including changes made in today's visit):  DIOVAN 160 MG TABS (VALSARTAN) Take 1 tablet by mouth once a day SYNTHROID 100 MCG TABS (LEVOTHYROXINE SODIUM) Take 1 tablet by mouth once a day NEXIUM 40 MG  CPDR (ESOMEPRAZOLE MAGNESIUM) per ent ZYRTEC ALLERGY 10 MG  TABS (CETIRIZINE HCL) per ent MULTIVITAMINS   TABS (MULTIPLE VITAMIN)  * ALLERCLEAR  * GLUCOSAMINE  * OSTO-BI-FLEX  * OSCAL/D  FLONASE 50 MCG/ACT  SUSP (FLUTICASONE PROPIONATE) 2 sprays each nostil once daily prn CLOBETASOL PROPIONATE 0.05 %  CREA (CLOBETASOL PROPIONATE) apply for 10 days to both hands      Review of Systems       do a lot of yard work sometimes uses gloves   Physical Exam  General:  alert and well-developed.   Abdomen:     groins are free of masses, no hernias detected.  No tenderness. Skin:     symmetric rash both hands affecting mostly the thumb and the third and fourth digit.  The skin is dry, peeling.  No redness no swelling no blisters.    Impression & Recommendations:  Problem # 1:  ECZEMA (ICD-692.9) on no new medicines, he did use Singulair several weeks ago.  he is not currently taking Singulair. Trial with topical creams. His updated medication list for this problem includes:    Zyrtec Allergy 10 Mg Tabs (Cetirizine hcl) .Marland Kitchen... Per ent    Clobetasol Propionate 0.05 % Crea (Clobetasol propionate) .Marland Kitchen... Apply for 10 days to both hands   Medications Added to Medication List This Visit: 1)  Diovan 160 Mg Tabs (Valsartan) .... Take 1 tablet by mouth once a day 2)  Synthroid 100 Mcg Tabs (Levothyroxine sodium) .... Take 1 tablet by mouth once a day 3)  Nexium 40 Mg Cpdr (Esomeprazole magnesium) .... Per ent 4)  Zyrtec Allergy 10 Mg Tabs (Cetirizine hcl) .... Per ent 5)  Multivitamins Tabs (Multiple vitamin) 6)  Allerclear  7)  Glucosamine  8)  Osto-bi-flex  9)  Oscal/d  10)  Flonase 50 Mcg/act Susp (Fluticasone propionate) .Marland KitchenMarland KitchenMarland Kitchen  2 sprays each nostil once daily prn 11)  Clobetasol Propionate 0.05 % Crea (Clobetasol propionate) .... Apply for 10 days to both hands   Patient Instructions: 1)  use the cream as prescribed for 10 days. 2)  If you are not getting better, please call us for dermatology referral.    Prescriptions: CLOBETASOL PROPIONATE 0.05 %  CREA (CLOBETASOL PROPIONATE) apply for 10 days to both hands  #1 x 0   Entered and Authorized by:   Nolon Rod. Makynlee Kressin MD   Signed by:   Nolon Rod. Devann Cribb MD on 12/07/2006   Method used:   Print then Give to Patient   RxID:   [id number]

## 2010-06-21 NOTE — Letter (Signed)
Summary: [facility]   Imported By: Lanelle Bal 12/21/2009 12:43:15  _____________________________________________________________________  External Attachment:    Type:   Image     Comment:   External Document

## 2010-06-21 NOTE — Assessment & Plan Note (Signed)
   Vital Signs:  Patient Profile:   77 Years Old Male Height:     62 inches Weight:      129.8 pounds Pulse rate:   58 / minute BP sitting:   120 / 80  (left arm) Cuff size:   regular  Vitals Entered By: Shary Decamp (Oct 09, 2006 8:12 AM)               Chief Complaint:  ROV; FASTING; C/O MUSCLE CRAMPS IN THE MORNING AND EVENING.  History of Present Illness: here to follow-up on his blood pressure. He also complained of allergies despite the use of Flonase and Zyrtec. He also complains of leg cramps mostly at night, at both calves       Review of Systems        no fever or green nasal discharge. He has tried stretching to help the cramps: no help he saw allergies are caractherize by sneezing and severe nasal itching   Physical Exam  General:     alert, well-developed, and well-nourished.   Head:     no pain to palpation of the sinuses Nose:     slightly congested Mouth:     no red or discharge    Impression & Recommendations:  Problem # 1:  HYPERTENSION (ICD-401.9)  well controlled, no change  Problem # 2:  HAY FEVER (ICD-477.0) see below  Problem # 3:  LEG CRAMPS (ICD-729.82) check a G6PD level if he is okay, will prescribe quinine as needed  Problem # 4:  follow-up in 6 months   Patient Instructions: 1)  use your Flonase every day. 2)  Continue with Zyrtec over-the-counter everyday. 3)  Please start Singulair  1 tablet a day.  This is also an allergy medication that should help you a lot.   4)  If you feel that Singulair helped, please fill the  prescription 5)  we are checking the  blood today to see if you can take  a leg cramp medicine.  Will call you soon with the results 6)  follow-up here in 6 months  Appended Document:   G6PD level is normal. Will call quinine 325 mg nightly p.r.n. #30 and 3 RF

## 2010-06-21 NOTE — Assessment & Plan Note (Signed)
Summary: FOLLOWUP ON MED/KN   Vital Signs:  Patient profile:   77 year old male Height:      62 inches Weight:      125.25 pounds BMI:     22.99 Pulse rate:   65 / minute Pulse rhythm:   regular BP sitting:   128 / 80  (left arm) Cuff size:   regular  Vitals Entered By: Army Fossa CMA (March 17, 2010 10:43 AM) CC: follow up visit-not fsating  Comments Pain in LLQ of abdomen x 2 months  Uses mail order  samples of nexium.     History of Present Illness:  follow up visit-not fasting  2 months history of left-sided abdominal pain, he points left from the umbilicus. The pain has been consistently postprandial, last about 30 minutes. No associated symptoms.  As far as his high blood pressure, his BP has  been below 100 sometimes, he has been taking only half of his medication for few days. BP today is within normal. Cough is resolved   Needs samples  of  Nexium  ROS Denies nausea, vomiting, or heartburn. bowel movements are regular, no blood in the stools Appetite is normal, some weight loss noticed  today no dysuria or gross hematuria       Allergies: No Known Drug Allergies  Past History:  Past Medical History: Hypertension Hypothyroidism EPISTAXIS-- f/u per ENT h/o ADENOCARCINOMA, COLON status post right hemicolectomy and  chemotherapy.  F/U with the cancer center and Dr. Ezzard Standing routinely. Had a Cscope Ezzard Standing) 2005 and 2008  INSOMNIA  ECZEMA  LEG CRAMPS  HAY FEVER   ERECTILE DYSFUNCTION  Osteopenia-- per DEXA 07-2008 (Rx fosamax)  Past Surgical History: Reviewed history from 08/28/2006 and no changes required. Inguinal herniorrhaphy  Social History: Reviewed history from 11/15/2009 and no changes required. original from [place] Married veggetarian two children tobacco--never ETOH--no  Review of Systems      See HPI  Physical Exam  General:  alert, well-developed, and well-nourished.   Neck:  normal bilateral carotid pulses Lungs:   normal respiratory effort, no intercostal retractions, no accessory muscle use, and normal breath sounds.   Heart:  normal rate, regular rhythm, no murmur, and no gallop.   Abdomen:  soft, normal bowel sounds, and no distention. he has a palpable aorta in the upper abdomen, no bruit; specifically the left side of the abdomen is soft and nontender.  No organomegaly.  Pulses:  normal femur pulses   Impression & Recommendations:  Problem # 1:  ABDOMINAL PAIN (ICD-789.00) Assessment New 2 months history of postprandial abdominal pain on exam he has a palpable aorta, he also has a history of colon cancer We'll start her workup with an aorta and a Mesenteric duplex ultrasonography  intestinal angina? if workup negative, will proceed with a CT or GI consult. reassess in  3 weeks, patient will call if symptoms rsevere  Orders: Venipuncture (04540) TLB-Hepatic/Liver Function Pnl (80076-HEPATIC) TLB-CBC Platelet - w/Differential (85025-CBCD) TLB-Amylase (82150-AMYL) TLB-Lipase (83690-LIPASE) Radiology Referral (Radiology)  Problem # 2:  HYPERTENSION (ICD-401.9) patient reports low blood pressure with 1 losartan, he self decreased to half tablet and apparently doing well also ,cough is resolved , likely was related to ACE inhibitors plan: Decrease losartan to half tablet  His updated medication list for this problem includes:    Losartan Potassium-hctz 50-12.5 Mg Tabs (Losartan potassium-hctz) ..... Half tablet daily  Problem # 3:  HYPOTHYROIDISM (ICD-244.9) labs  His updated medication list for this problem includes:  Synthroid 100 Mcg Tabs (Levothyroxine sodium) .Marland Kitchen... Take 1 tablet by mouth once a day  Orders: TLB-TSH (Thyroid Stimulating Hormone) (84443-TSH)  Labs Reviewed: TSH: 1.00 (11/15/2009)    Chol: 204 (04/05/2009)   HDL: 38.90 (04/05/2009)   LDL: DEL (07/23/2008)   TG: 461.0 (04/05/2009)  Complete Medication List: 1)  Losartan Potassium-hctz 50-12.5 Mg Tabs (Losartan  potassium-hctz) .... Half tablet daily 2)  Synthroid 100 Mcg Tabs (Levothyroxine sodium) .... Take 1 tablet by mouth once a day 3)  Nexium 40 Mg Cpdr (Esomeprazole magnesium) .... Per ent 4)  Kls Aller-tec 10 Mg Tabs (Cetirizine hcl) .... As needed 5)  Flonase 50 Mcg/act Susp (Fluticasone propionate) .... 2 sprays each nostil once daily prn 6)  Fosamax 70 Mg Tabs (Alendronate sodium) .Marland Kitchen.. 1 tablet a week 7)  Multivitamins Tabs (Multiple vitamin) 8)  Oscal 500/200 D-3 500-200 Mg-unit Tabs (Calcium-vitamin d) .Marland Kitchen.. 1 by mouth daily 9)  Omega-3  .... Take once daily 10)  Viagra 100 Mg Tabs (Sildenafil citrate) .... 1/2 or 1 tab no more often than once a day  Patient Instructions: 1)  the only half tablet of losartan daily, call  if your BP is consistently more than 140-85 2)  Please schedule a follow-up appointment in 3  weeks.    Orders Added: 1)  Venipuncture [36415] 2)  TLB-Hepatic/Liver Function Pnl [80076-HEPATIC] 3)  TLB-CBC Platelet - w/Differential [85025-CBCD] 4)  TLB-Amylase [82150-AMYL] 5)  TLB-Lipase [83690-LIPASE] 6)  TLB-TSH (Thyroid Stimulating Hormone) [84443-TSH] 7)  Radiology Referral [Radiology] 8)  Est. Patient Level IV [41324]    Laboratory Results   Urine Tests    Routine Urinalysis   Color: yellow Appearance: Clear Glucose: negative   (Normal Range: Negative) Bilirubin: negative   (Normal Range: Negative) Ketone: negative   (Normal Range: Negative) Spec. Gravity: 1.010   (Normal Range: 1.003-1.035) Blood: negative   (Normal Range: Negative) pH: 7.5   (Normal Range: 5.0-8.0) Protein: negative   (Normal Range: Negative) Urobilinogen: 0.2   (Normal Range: 0-1) Nitrite: negative   (Normal Range: Negative) Leukocyte Esterace: negative   (Normal Range: Negative)    Comments: Army Fossa CMA  March 17, 2010 10:56 AM

## 2010-06-21 NOTE — Progress Notes (Signed)
Summary: chest pain - dr Drue Novel   Phone Note Call from Patient Call back at [facility] Phone [phone] Call back at [phone]   Caller: Patient Complaint: Chest Pain Summary of Call: patient is having chest pain x's 5 days - little pain right arm  Initial call taken by: Okey Regal [place],  November 15, 2007 10:53 AM  Follow-up for Phone Call        Spoke with patient -- he is having chest pain, SOB, sweating.  Advised patient to go to the ED.  Patient states he wants to come here to see Dr. Drue Novel.  Advised patient that he really needs to go to ED.  Patient hung up on me.............Marland KitchenShary Decamp  November 15, 2007 11:01 AM  called [phone] -- spoke with translator [phone] -- she contacted patient and advised him that he needs to go to ED.  Patient was upset that we contacted translator -- he states he is not having chest pain but is having chest pressure.....advised patient (via translator) that it could still be cardiac....needs eval at ED..........Marland Kitchenpatient states he will not go to the ED and is upset because we refuse to see him...Marland KitchenMarland KitchenMarland Kitchenhe is upset that we contacted translator...Marland KitchenMarland KitchenMarland Kitchenhe states "I am not having a heart attack".....advised patient that we are not refusing to see him but if he is having chest pain/pressure our protocool is that he needs eval @ ED.  Pt said thank you & hung up .......................Marland KitchenShary Decamp  November 15, 2007 11:20 AM agree w/ the advise, the right place to eval him is , unfortunately, the ED. We called the transalator to be sure he got our recommendation correctly.Marland KitchenMarland KitchenJose E. Daquane Aguilar MD  November 15, 2007 11:29 AM

## 2010-06-21 NOTE — Assessment & Plan Note (Signed)
Summary: FLU SHOT/KN  Flu Vaccine Consent Questions     Do you have a history of severe allergic reactions to this vaccine? no    Any prior history of allergic reactions to egg and/or gelatin? no    Do you have a sensitivity to the preservative Thimersol? no    Do you have a past history of Guillan-Barre Syndrome? no    Do you currently have an acute febrile illness? no    Have you ever had a severe reaction to latex? no    Vaccine information given and explained to patient? yes    Are you currently pregnant? no    Lot Number:[id number]   Exp Date:11/19/2010   Site Given  Left Deltoid IM  Nurse Visit   Allergies: No Known Drug Allergies  Orders Added: 1)  Flu Vaccine 21yrs + MEDICARE PATIENTS [Q2039] 2)  Administration Flu vaccine - MCR [G0008]

## 2010-06-21 NOTE — Miscellaneous (Signed)
Summary: Orders Update  Clinical Lists Changes  Orders: Added new Test order of Mesenteric (Mesenteric) - Signed 

## 2010-06-21 NOTE — Assessment & Plan Note (Signed)
Summary: yearly check/lab/cbs   Vital Signs:  Patient profile:   77 year old male Height:      62 inches Weight:      127 pounds Temp:     98.2 degrees F oral Pulse rate:   72 / minute Resp:     20 per minute BP sitting:   110 / 52  (left arm)  Vitals Entered By: Jeremy Johann CMA (November 15, 2009 9:00 AM) CC: yearly  Comments --discuss changing med --fasting REVIEWED MED LIST, PATIENT AGREED DOSE AND INSTRUCTION CORRECT    History of Present Illness: Here for Medicare Physical exam  1 Risk factors based on Past M, S, F history: REVIEWED 2.Physical Activities: remains active, able to go upstairs w/o problems  3. Depression/mood: admits to some depression mild, misses his children , they live in [place] and [place] 4.Hearing: normal to whispered voice, no complaints form patient  5.ADL's: totally independent  6.Fall Risk:  low risk, independent, no recent falls  7. Home Safety:  discussed need to check his smoke detectors, med-alert? 8.Height, weight, &visual acuity: see VS, vision corrected  9. Counseling:  counseling about depression 1 Labs ordered based on risk factors: yes  11  Referral Coordination:  referred her for a chest x-ray 12. Care Plan : See below...  assessment and plan 13. Cognitive Assessment: memory normal, Alertness and  attention span normal on exam , Motor skill appropiate for age    in addition to his physical, we also assess the following: Hypertension-- ambulatory SBPs 110-130;  c/o noturnal , mostly dry cough x months, not able to sleep complained of fatigue,ill-defined, denies orthopnea, able to do all his activities of daily living without disruption. See review of systems Hypothyroidism-- good medication compliance  Osteopenia-- - good medication compliance w/ fosamax  w/o apparent problems.   Allergies (verified): No Known Drug Allergies  Past History:  Past Medical History: Hypertension Hypothyroidism EPISTAXIS-- f/u per ENT h/o  ADENOCARCINOMA, COLON status post right hemicolectomy and  chemotherapy.  F/U with the cancer center and Dr. Ezzard Standing routinely. Had a Cscope Ezzard Standing) 2005 and 2008  INSOMNIA  ECZEMA  LEG CRAMPS  HAY FEVER   ERECTILE DYSFUNCTION  Osteopenia-- per DEXA 07-2008 (Rx fosamax)  Past Surgical History: Reviewed history from 08/28/2006 and no changes required. Inguinal herniorrhaphy  Family History: Reviewed history from 07/23/2008 and no changes required. DM--no MI--no Colon ca--no Prostate ca--no  Social History: original from [place] Married veggetarian two children tobacco--never ETOH--no  Review of Systems CV:  Denies chest pain or discomfort, palpitations, and swelling of feet. GI:  Denies diarrhea, nausea, and vomiting. GU:  Denies hematuria, urinary frequency, and urinary hesitancy.  Physical Exam  General:  alert, well-developed, and well-nourished.   Neck:  no masses, no thyromegaly, and normal carotid upstroke.   Lungs:  normal respiratory effort, no intercostal retractions, no accessory muscle use, and normal breath sounds.   Heart:  normal rate, regular rhythm, no murmur, and no gallop.   Abdomen:  soft, normal bowel sounds, no distention, no masses, no guarding, and no rigidity.   Rectal:  No external abnormalities noted. Normal sphincter tone. No rectal masses or tenderness. Prostate:  Prostate gland firm and smooth, no enlargement, nodularity, tenderness, mass, asymmetry or induration. Extremities:  no edema Psych:  normally interactive, good eye contact, not anxious appearing, and not depressed appearing.     Impression & Recommendations:  Problem # 1:  HEALTH SCREENING (ICD-V70.0)  had TD patient not sure when---  gave him one today  pneumonia shot, 2010 reports had a shingles shot @ [facility]  parking permit signed Colonoscopy:   Date:  10/08/2006   Next Due:  10/2011   Results:  normal   check a PSA  Orders: First annual wellness visit with prevention plan   (H4742)  Problem # 2:  FATIGUE (ICD-780.79)  review of systems essentially negative He seems to be doing well Mild depression?. Counseled  Orders: TLB-B12 + Folate Pnl (59563_87564-P32/RJJ)  Problem # 3:  COUGH (ICD-786.2)  cough for several months, mostly nocturnal and dry Mild GERD symptoms Plan: Chest x-ray Change ACE inhibitors  Orders: T-2 View CXR (71020TC)  Problem # 4:  OSTEOPENIA (ICD-733.90)  on Fosamax and vitamin D Labs His updated medication list for this problem includes:    Fosamax 70 Mg Tabs (Alendronate sodium) .Marland Kitchen... 1 tablet a week    Oscal 500/200 D-3 500-200 Mg-unit Tabs (Calcium-vitamin d) .Marland Kitchen... 1 by mouth daily  Orders: Venipuncture (88416) T-Vitamin D (25-Hydroxy) (60630-16010)  Problem # 5:  HYPERTRIGLYCERIDEMIA (ICD-272.1) on diet only, reassess and return to the office Labs Reviewed: SGOT: 31 (04/05/2009)   SGPT: 21 (08/19/2008)   HDL:38.90 (04/05/2009), 36.7 (07/23/2008)  LDL:DEL (07/23/2008), DEL (06/27/2007)  Chol:204 (04/05/2009), 210 (07/23/2008)  Trig:461.0 (04/05/2009), 334 (07/23/2008)  Problem # 6:  HYPOTHYROIDISM (ICD-244.9)  last TSH elevated, today states he has good compliance Labs His updated medication list for this problem includes:    Synthroid 100 Mcg Tabs (Levothyroxine sodium) .Marland Kitchen... Take 1 tablet by mouth once a day  Labs Reviewed: TSH: 6.81 (04/05/2009)    Chol: 204 (04/05/2009)   HDL: 38.90 (04/05/2009)   LDL: DEL (07/23/2008)   TG: 461.0 (04/05/2009)  Orders: TLB-TSH (Thyroid Stimulating Hormone) (84443-TSH)  Problem # 7:  HYPERTENSION (ICD-401.9) chronic dry cough Change ACE inhibitors to losartan His updated medication list for this problem includes:    Losartan Potassium-hctz 50-12.5 Mg Tabs (Losartan potassium-hctz) ..... One by mouth daily  Complete Medication List: 1)  Losartan Potassium-hctz 50-12.5 Mg Tabs (Losartan potassium-hctz) .... One by mouth daily 2)  Synthroid 100 Mcg Tabs (Levothyroxine  sodium) .... Take 1 tablet by mouth once a day 3)  Nexium 40 Mg Cpdr (Esomeprazole magnesium) .... Per ent 4)  Kls Aller-tec 10 Mg Tabs (Cetirizine hcl) .... As needed 5)  Flonase 50 Mcg/act Susp (Fluticasone propionate) .... 2 sprays each nostil once daily prn 6)  Fosamax 70 Mg Tabs (Alendronate sodium) .Marland Kitchen.. 1 tablet a week 7)  Multivitamins Tabs (Multiple vitamin) 8)  Oscal 500/200 D-3 500-200 Mg-unit Tabs (Calcium-vitamin d) .Marland Kitchen.. 1 by mouth daily 9)  Omega-3  .... Take once daily  Other Orders: TLB-PSA (Prostate Specific Antigen) (84153-PSA) Tdap => 23yrs IM (93235) Admin 1st Vaccine (57322) Admin 1st Vaccine [facility]) [phone])  Patient Instructions: 1)  stop benazepril , start losartan  2)  get a chest x-ray 3)  Please schedule a follow-up appointment in 3 weeks.  Will do all your 90 day refills when you come back Prescriptions: LOSARTAN POTASSIUM-HCTZ 50-12.5 MG TABS (LOSARTAN POTASSIUM-HCTZ) one by mouth daily  #30 x 6   Entered and Authorized by:   Elita Quick E. Purl Claytor MD   Signed by:   Nolon Rod. Sumiya Mamaril MD on 11/15/2009   Method used:   Print then Mail to Patient   RxID:   [id number]    Tetanus/Td Vaccine    Vaccine Type: Tdap    Site: right deltoid    Mfr: GlaxoSmithKline    Dose: 0.5 ml  Route: IM    Given by: Jeremy Johann CMA    Exp. Date: 08/14/2011    Lot #: [id number]    VIS given: 04/09/07 version given November 15, 2009.

## 2010-06-21 NOTE — Progress Notes (Signed)
Summary: 90 DAY PRESCRIPTION FOR LEVOXYL  Phone Note Call from Patient Call back at Home Phone [phone]   Caller: Patient Summary of Call: PATIENT BROUGHT IN PRESCRIPTIION FORM FOR "[facility]" FOR HIS MEDICATION = LEVOXYL TAB 0.1 MG---REQUESTS THAT WE FAX THIS FOR HIM SINCE HE ONLY HAS SEVEN DAYS LEFT OF THIS MEDICATION  NEEDS 90 DAYS PRESCRIPTION PLUS REFILLS  WILL GIVE PAPERWORK TO ALIDA Initial call taken by: Jerolyn Shin,  January 18, 2009 12:02 PM    Prescriptions: SYNTHROID 100 MCG TABS (LEVOTHYROXINE SODIUM) Take 1 tablet by mouth once a day  #90 x 2   Entered by:   Kandice Hams   Authorized by:   Nolon Rod. Paz MD   Signed by:   Kandice Hams on 01/18/2009   Method used:   Printed then faxed to ...       [facility]order[facility]             , [place]         Ph:        Fax: [phone]   RxID:   [id number]   Appended Document: 31 DAY PRESCRIPTION FOR LEVOXYL RX FAXED  WITH FORM TO [phone]

## 2010-06-21 NOTE — Progress Notes (Signed)
Summary: RX  Phone Note Call from Patient Call back at Home Phone [phone]   Caller: Patient Reason for Call: Refill Medication Summary of Call: REFILL ON LEVOTHRYOXINE SODIUM,,ALENDRONATE SODIUM FOR 90 DAY  SUPPLY X3 WILL PICK UP SCRIPT WHEN READY Initial call taken by: Freddy Jaksch,  February 14, 2010 3:30 PM  Follow-up for Phone Call        I spoke with pt he is switching pharmacy and needs a new rx for his levothyroxine. Army Fossa CMA  February 14, 2010 3:40 PM     Prescriptions: FOSAMAX 70 MG TABS (ALENDRONATE SODIUM) 1 tablet a week  #12 Tablet x 3   Entered by:   Army Fossa CMA   Authorized by:   Nolon Rod. Paz MD   Signed by:   Army Fossa CMA on 02/14/2010   Method used:   Print then Give to Patient   RxID:   [id number] SYNTHROID 100 MCG TABS (LEVOTHYROXINE SODIUM) Take 1 tablet by mouth once a day  #90 x 3   Entered by:   Army Fossa CMA   Authorized by:   Nolon Rod. Paz MD   Signed by:   Army Fossa CMA on 02/14/2010   Method used:   Print then Give to Patient   RxID:   [id number]

## 2010-06-21 NOTE — Assessment & Plan Note (Signed)
Summary: yearly.sl  Medications Added * ECASA 81 mg 1x per day.      Allergies Added: NKDA  Visit Type:  1 year follow up  CC:  Chest tightness.  History of Present Illness: Patient is a 77 year old with a history of hypertension, hypothyroidism, dyslipidemia and chest pains  Normal myoview in 2010t.  I last saw him in 2010. Since seen he continues to have intermiet patin.  Tightness.  Occurs without activity.  He notes no change in quality or frequency since 2010.  His breathing is OK.  He remains agtive.  Current Medications (verified): 1)  Losartan Potassium-Hctz 50-12.5 Mg Tabs (Losartan Potassium-Hctz) .... Half Tablet Daily 2)  Synthroid 100 Mcg Tabs (Levothyroxine Sodium) .... Take 1 Tablet By Mouth Once A Day 3)  Nexium 40 Mg  Cpdr (Esomeprazole Magnesium) .... Per Ent 4)  Kls Aller-Tec 10 Mg Tabs (Cetirizine Hcl) .... As Needed 5)  Flonase 50 Mcg/act  Susp (Fluticasone Propionate) .... 2 Sprays Each Nostil Once Daily Prn 6)  Fosamax 70 Mg Tabs (Alendronate Sodium) .Marland Kitchen.. 1 Tablet A Week 7)  Multivitamins   Tabs (Multiple Vitamin) 8)  Oscal 500/200 D-3 500-200 Mg-Unit Tabs (Calcium-Vitamin D) .Marland Kitchen.. 1 By Mouth Daily 9)  Omega-3 .... Take Once Daily 10)  Viagra 100 Mg Tabs (Sildenafil Citrate) .... 1/2 or 1 Tab No More Often Than Once A Day  Allergies (verified): No Known Drug Allergies  Past History:  Past medical, surgical, family and social histories (including risk factors) reviewed, and no changes noted (except as noted below).  Past Medical History: Reviewed history from 04/07/2010 and no changes required.  Aortic insufficiency Hypertension Hypothyroidism EPISTAXIS-- f/u per ENT h/o ADENOCARCINOMA, COLON status post right hemicolectomy and  chemotherapy.  F/U with the cancer center and Dr. Ezzard Standing routinely. Had a Cscope Ezzard Standing) 2005 and 2008  INSOMNIA  ECZEMA  LEG CRAMPS  HAY FEVER   ERECTILE DYSFUNCTION  Osteopenia-- per DEXA 07-2008 (Rx fosamax)  Past  Surgical History: Reviewed history from 04/07/2010 and no changes required. Inguinal herniorrhaphy  Family History: Reviewed history from 04/07/2010 and no changes required. DM--no MI--no Colon ca--no Prostate ca--no  Social History: Reviewed history from 04/07/2010 and no changes required. original from [place] Married veggetarian two children tobacco--never ETOH--no  Review of Systems       ALl systems reivewed.  Neg to the above problem.  Vital Signs:  Patient profile:   77 year old male Height:      62 inches Weight:      127 pounds BMI:     23.31 Pulse rate:   67 / minute Pulse rhythm:   regular Resp:     18 per minute BP sitting:   140 / 64  (left arm) Cuff size:   large  Vitals Entered By: Vikki Ports (April 08, 2010 11:52 AM)  Physical Exam  Additional Exam:  patient is in NAD HEENT:  Normocephalic, atraumatic. EOMI, PERRLA.  Neck: JVP is normal. No thyromegaly. No bruits.  Lungs: clear to auscultation. No rales no wheezes.  Heart: Regular rate and rhythm. Normal S1, S2. No S3.   No significant murmurs. PMI not displaced.  Abdomen:  Supple, nontender. Normal bowel sounds. No masses. No hepatomegaly.  Extremities:   Good distal pulses throughout. No lower extremity edema.  Musculoskeletal :moving all extremities.  Neuro:   alert and oriented x3.    EKG  Procedure date:  04/08/2010  Findings:      NSR.  67 bpm.  Impression & Recommendations:  Problem # 1:  HYPERTRIGLYCERIDEMIA (ICD-272.1) Will need to be followed.  Last trig was 461.  LDL was 78  Problem # 2:  HYPERTENSION (ICD-401.9) Adequate control. His updated medication list for this problem includes:    Losartan Potassium-hctz 50-12.5 Mg Tabs (Losartan potassium-hctz) ..... Half tablet daily  Problem # 3:  CHEST PAIN-UNSPECIFIED (ICD-786.50) I am not conviinced the patient's symptoms are angina.  Atyp  Have not changed in 1 year.  Nl myoview in past.  Patient Instructions: 1)   Your physician wants you to follow-up in:1 year 2)     You will receive a reminder letter in the mail two months in advance. If you don't receive a letter, please call our office to schedule the follow-up appointment.

## 2010-06-21 NOTE — Progress Notes (Signed)
Summary: copy of report from 11/14  Phone Note Call from Patient   Caller: Patient Summary of Call: Pt came in an requested a copy of his report for 11/14. Report printed and given to pt Initial call taken by: Lavell Islam,  April 11, 2010 11:47 AM

## 2010-06-21 NOTE — Letter (Signed)
Summary: External Correspondence  External Correspondence   Imported By: Vanessa Swaziland 02/27/2007 11:37:49  _____________________________________________________________________  External Attachment:    Type:   Image     Comment:   External Document

## 2010-06-21 NOTE — Assessment & Plan Note (Signed)
Summary: [name]   Vital Signs:  Patient Profile:   77 Years Old Male Height:     62 inches Weight:      131.8 pounds Pulse rate:   78 / minute BP sitting:   124 / 70  Vitals Entered By: Shary Decamp (June 27, 2007 8:12 AM)                 Chief Complaint:  rov - fasting; rf on bp med.  History of Present Illness: ROV  Current Allergies (reviewed today): No known allergies      Review of Systems       doing well c/o RN "all the time" no pruritus at nose or eyes no cough Amb BP "ok" but brought no readings needs RFs   Physical Exam  General:     alert, well-developed, and well-nourished.   Ears:     R ear normal and L ear normal.   Nose:     not congested Mouth:     pharynx pink and moist.   Lungs:     normal respiratory effort, no intercostal retractions, no accessory muscle use, and normal breath sounds.   Heart:     normal rate, regular rhythm, and no murmur.   Extremities:     no edema    Impression & Recommendations:  Problem # 1:  HYPOTHYROIDISM (ICD-244.9)  His updated medication list for this problem includes:    Synthroid 100 Mcg Tabs (Levothyroxine sodium) .Marland Kitchen... Take 1 tablet by mouth once a day  Labs Reviewed: TSH: 1.15 (08/28/2006)    Chol: 180 (08/28/2006)   HDL: 36.2 (08/28/2006)   LDL: DEL (08/28/2006)   TG: 437 (08/28/2006)  Orders: Venipuncture ([address]) TLB-TSH (Thyroid Stimulating Hormone) (84443-TSH)   Problem # 2:  HYPERTENSION (ICD-401.9)  His updated medication list for this problem includes:    Benazepril-hydrochlorothiazide 20-12.5 Mg Tabs (Benazepril-hydrochlorothiazide) .Marland Kitchen... 2 by mouth once daily in themorning  BP today: 124/70 Prior BP: 140/80 (04/23/2007)  Labs Reviewed: Creat: 1.3 (04/23/2007) Chol: 180 (08/28/2006)   HDL: 36.2 (08/28/2006)   LDL: DEL (08/28/2006)   TG: 437 (08/28/2006)   Problem # 3:  HEALTH SCREENING (ICD-V70.0) last yearly aprox 4-08 PSA  4-07 1.3, 4-08  2.7 UTD in Cscopes  will check a FLP RTC in 3 or 4 months for yearly  Orders: TLB-Lipid Panel (80061-LIPID)   Complete Medication List: 1)  Synthroid 100 Mcg Tabs (Levothyroxine sodium) .... Take 1 tablet by mouth once a day 2)  Nexium 40 Mg Cpdr (Esomeprazole magnesium) .... Per ent 3)  Zyrtec Allergy 10 Mg Tabs (Cetirizine hcl) .... Per ent 4)  Multivitamins Tabs (Multiple vitamin) 5)  Allerclear  6)  Glucosamine  7)  Osto-bi-flex  8)  Oscal/d  9)  Flonase 50 Mcg/act Susp (Fluticasone propionate) .... 2 sprays each nostil once daily prn 10)  Clobetasol Propionate 0.05 % Crea (Clobetasol propionate) .... Apply for 10 days to both hands 11)  Benazepril-hydrochlorothiazide 20-12.5 Mg Tabs (Benazepril-hydrochlorothiazide) .... 2 by mouth once daily in themorning   Patient Instructions: 1)  Please schedule a follow-up appointment in 3 to 4  months.    Prescriptions: SYNTHROID 100 MCG TABS (LEVOTHYROXINE SODIUM) Take 1 tablet by mouth once a day  #90 x 1   Entered by:   Shary Decamp   Authorized by:   Nolon Rod. Bernon Arviso MD   Signed by:   Shary Decamp on 06/27/2007   Method used:   Reprint   RxID:  [id number] BENAZEPRIL-HYDROCHLOROTHIAZIDE 20-12.5 MG  TABS (BENAZEPRIL-HYDROCHLOROTHIAZIDE) 2 by mouth once daily in themorning  #90 x 0   Entered by:   Shary Decamp   Authorized by:   Nolon Rod. Honest Safranek MD   Signed by:   Shary Decamp on 06/27/2007   Method used:   Reprint   RxID:   [id number] SYNTHROID 100 MCG TABS (LEVOTHYROXINE SODIUM) Take 1 tablet by mouth once a day  #90 x 1   Entered by:   Shary Decamp   Authorized by:   Nolon Rod. Tharun Cappella MD   Signed by:   Shary Decamp on 06/27/2007   Method used:   Reprint   RxID:   [id number]  ]

## 2010-06-21 NOTE — Progress Notes (Signed)
Summary: REFILL ON MED  Phone Note Call from Patient Call back at Home Phone [phone]   Caller: Patient Reason for Call: Refill Medication Summary of Call: PT WALKED IN AND HE NEEDS A REFIIL ON MOMETASONE FUROATE 0.1% CREPER.  PT  WANT TO PICK THIS RX UP.   CALL BACK NUMBER IS [phone]. Initial call taken by: Vanessa Swaziland,  January 08, 2007 11:43 AM  Follow-up for Phone Call        pt aware needs to get from derm ..................................................................Marland KitchenShary Decamp  January 08, 2007 12:48 PM

## 2010-06-21 NOTE — Letter (Signed)
Summary: [facility]  [facility]   Imported By: Freddy Jaksch 10/11/2007 15:35:45  _____________________________________________________________________  External Attachment:    Type:   Image     Comment:   External Document

## 2010-06-21 NOTE — Assessment & Plan Note (Signed)
   Vital Signs:  Patient Profile:   77 Years Old Male Height:     62 inches Weight:      133 pounds Temp:     97.8 degrees F oral Pulse rate:   60 / minute Pulse rhythm:   regular Resp:     12 per minute BP sitting:   170 / 82  (left arm)  Vitals Entered By: Shary Decamp (August 28, 2006 8:29 AM)               Chief Complaint:  yearly; fasting; c/o allergy sxs - ithchy eyes; needs rf on dovonex/clobestasol.  History of Present Illness: here for a checkup. He has noted his blood pressure to be elevated lately. Need refill on a cream for a rash his legs. He was seen by dermatology last year and prescribed Dovonex and clobetasol. occasional bilateral knee pain without any swelling or redness    Past Medical History:    Hypertension    Hypothyroidism  Past Surgical History:    Inguinal herniorrhaphy   Social History:    Married    two children   Risk Factors:  Tobacco use:  never Alcohol use:  no   Review of Systems  CV      had chest pain a few weeks ago but this is largely resolve.  Resp      Denies cough and shortness of breath.  GI      Denies bloody stools, diarrhea, and nausea.  Neuro      Denies headaches.   Physical Exam  General:     alert and well-developed.   Neck:     supple, no thyromegaly, no thyroid nodules or tenderness, and normal carotid upstroke.   Lungs:     normal respiratory effort, no intercostal retractions, no accessory muscle use, and normal breath sounds.   Heart:     normal rate, regular rhythm, and no gallop. ?soft systolic murmur   Abdomen:     soft, non-tender, no hepatomegaly, and no splenomegaly.   Rectal:     known stools were found Prostate:     no gland enlargement, no nodules, and no asymmetry.   Skin:     patches of  erythematous and scaly rash mostly of the legs.    Impression & Recommendations:  Problem # 1:  HYPERTENSION (ICD-401.9) blood pressure to the gaze is slightly unabated. In  previously OV his blood pressure has always been stable. Will advise on low salt diet and recheck in 6 weeks.  Problem # 2:  HYPOTHYROIDISM (ICD-244.9) his last TSH was in January and it was normal.  No change.check TSH  Problem # 3:  HAY FEVER (ICD-477.0) Zyrtec over-the-counter. Also prescribed alocril eyedrops for p.r.n. use  Problem # 4:  DERMATITIS (ICD-692.9) RFclobetasol . see them apology if the rash continue.  Problem # 5:  primary care issues up-to-date on his shots. Colon cancer follow-up by oncology and surgery. Check PSA today. EKG on January was normal. last CBC and LFTs were in January as well and they were normal.  Problem # 6:  office visit in 6 weeks to follow-up on his blood pressure.  Problem # 7:  INSOMNIA (ICD-780.52) at the end of ithe OV he said he also complained of trouble sleeping and would like a mild medication to help his sleep. Prescribed Xanax 0.25 one or two p.o. nightly p.r.n.Marland Kitchen  Prescription for 60 no refills

## 2010-06-21 NOTE — Letter (Signed)
Summary: Generic Letter  [facility] [place]  [address] [place], [place] [address]   Phone: [phone]  Fax: [phone]    10/24/2007  [facility] [address] [place], [place]  [address]  Dear Mr. Gatchel,   Please have the shingles shot at the local health department.  This may be used as a referral for the shingles (zostavax) vaccine.        Sincerely,   Loreen Freud, DO [place]

## 2010-06-21 NOTE — Assessment & Plan Note (Signed)
Summary: flu  Nurse Visit    Prior Medications: BENAZEPRIL-HYDROCHLOROTHIAZIDE 20-12.5 MG  TABS (BENAZEPRIL-HYDROCHLOROTHIAZIDE) 2 by mouth once daily in themorning SYNTHROID 100 MCG TABS (LEVOTHYROXINE SODIUM) Take 1 tablet by mouth once a day NEXIUM 40 MG  CPDR (ESOMEPRAZOLE MAGNESIUM) per ent ZYRTEC ALLERGY 10 MG  TABS (CETIRIZINE HCL) per ent FLONASE 50 MCG/ACT  SUSP (FLUTICASONE PROPIONATE) 2 sprays each nostil once daily prn CLOBETASOL PROPIONATE 0.05 %  CREA (CLOBETASOL PROPIONATE) apply for 10 days to both hands MULTIVITAMINS   TABS (MULTIPLE VITAMIN)  ALLERCLEAR ()  GLUCOSAMINE ()  OSTO-BI-FLEX ()  OSCAL/D ()  Current Allergies: No known allergies   Flu Vaccine Consent Questions     Do you have a history of severe allergic reactions to this vaccine? no    Any prior history of allergic reactions to egg and/or gelatin? no    Do you have a sensitivity to the preservative Thimersol? no    Do you have a past history of Guillan-Barre Syndrome? no    Do you currently have an acute febrile illness? no    Have you ever had a severe reaction to latex? no    Vaccine information given and explained to patient? yes    Are you currently pregnant? no    Lot Number:[id number]   Site Given  Left Deltoid IM .medflu   Orders Added: 1)  Flu Vaccine 84yrs + [[address]] 2)  Administration Flu vaccine [G0008]    ]

## 2010-06-21 NOTE — Progress Notes (Signed)
Summary: rf request for cream  Phone Note From Pharmacy   [facility] [address] Reason for Call: Needs renewal Summary of Call: refill request for: Clobetasol Cream 0.05% apply to both had s for 10days Disp 30gms last filled 12/07/06 Initial call taken by: Shary Decamp,  January 07, 2007 12:12 PM  Follow-up for Phone Call        he will se the dermatologist soon. Ask pt. to wait until then, if we RX more steroids, the dermatologist won't be able to figure out what is the problem....................................................................Miyah Hampshire E. Siona Coulston MD  January 07, 2007 8:34 PM  pt aware ..................................................................Marland KitchenShary Decamp  January 08, 2007 12:49 PM

## 2010-06-21 NOTE — Letter (Signed)
Summary: nosebleed eval  [facility]  [facility]   Imported By: Freddy Jaksch 10/11/2007 15:44:31  _____________________________________________________________________  External Attachment:    Type:   Image     Comment:   External Document

## 2010-06-21 NOTE — Progress Notes (Signed)
Summary: xray results  Phone Note Outgoing Call   Details for Reason: XR RESULTS: advise patient , XR neg, Tylenol call if no better in 2 weeks Signed by [facility]: Lambert Rhonda W E. Paz MD on 03/02/2008 at 3:22 PM Summary of Call: discussed with patient .Marland KitchenMarland KitchenShary Lindsey  March 03, 2008 11:21 AM

## 2010-06-21 NOTE — Progress Notes (Signed)
Summary: SYNTHROID   Phone Note Call from Patient   Caller: Patient Summary of Call: PT WALKED IN [facility] NEVER GOT RX FOR HIS SYNTHROID NOW NEEDS 30 DAY RX AS WELL AS 90 DAY RX WILL TAKE WITH HIM. DR Laury Axon SIGNED RX IN THE ABSENCE OF DR PAZ Initial call taken by: Kandice Hams,  January 26, 2009 12:03 PM    Prescriptions: SYNTHROID 100 MCG TABS (LEVOTHYROXINE SODIUM) Take 1 tablet by mouth once a day  #30 x 0   Entered by:   Kandice Hams   Authorized by:   Loreen Freud DO   Signed by:   Kandice Hams on 01/26/2009   Method used:   Print then Give to Patient   RxID:   [id number] SYNTHROID 100 MCG TABS (LEVOTHYROXINE SODIUM) Take 1 tablet by mouth once a day  #90 x 2   Entered by:   Kandice Hams   Authorized by:   Loreen Freud DO   Signed by:   Kandice Hams on 01/26/2009   Method used:   Print then Give to Patient   RxID:   [id number]

## 2010-06-21 NOTE — Progress Notes (Signed)
Summary: rx  Phone Note Outgoing Call Call back at Home Phone [phone]   Call placed by: Shary Decamp,  Oct 18, 2006 1:38 PM Call placed to: Patient Details for Reason: calling re:rx Summary of Call: need to call in qualaquin per last office visit - called pt Left message on machine to return call need pharmacy information Initial call taken by: Shary Decamp,  Oct 18, 2006 1:39 PM  Follow-up for Phone Call        [facility] [address] [facility] - pt aware Follow-up by: Shary Decamp,  Oct 18, 2006 2:52 PM

## 2010-06-21 NOTE — Progress Notes (Signed)
Summary: NEEDS REFERRAL FOR SHINGLES SHOT  Phone Note Call from Patient Call back at Home Phone [phone]   Caller: Patient Summary of Call: PATIENT AND HIS WIFE BOTH  [facility]OT--MR Dahlen SAYS PAYMENT  IS BEING DENIED BECAUSE THEY SHOULD HAVE HAD A REFERRAL FROM DR LOWNE TO GET THIS SHOT--HE SAYS THE [employer] COMPANY SAYS TO GET A REFERRAL FROM DR LOWNE EVEN IF IT IS AFTER GETTING THE SHOT  PAPERWORK REGARDING MR AND MRS Burges IS IN A PLASTIC SLEEVE AND WAS TAKEN TO [facility] DESK Initial call taken by: Jerolyn Shin,  October 23, 2007 4:07 PM  Follow-up for Phone Call        Dr. Laury Axon, Please see Ardyth Man  October 24, 2007 8:19 AM  Follow-up by: Ardyth Man,  October 24, 2007 8:19 AM  Additional Follow-up for Phone Call Additional follow up Details #1::        OK to give order for shingles Additional Follow-up by: Loreen Freud DO,  October 24, 2007 1:42 PM

## 2010-06-21 NOTE — Progress Notes (Signed)
Summary: refill  Phone Note Refill Request Message from:  Fax from Pharmacy on August 19, 2009 2:41 PM  Refills Requested: Medication #1:  BENAZEPRIL-HYDROCHLOROTHIAZIDE 20-12.5 MG  TABS 2 by mouth once daily in [facility] fax [phone]   Method Requested: Fax to Local Pharmacy Next Appointment Scheduled: no appt Initial call taken by: Barb Merino,  August 19, 2009 2:42 PM    Prescriptions: BENAZEPRIL-HYDROCHLOROTHIAZIDE 20-12.5 MG  TABS (BENAZEPRIL-HYDROCHLOROTHIAZIDE) 2 by mouth once daily in themorning  #90 x 0   Entered by:   Kandice Hams   Authorized by:   Nolon Rod. Paz MD   Signed by:   Kandice Hams on 08/19/2009   Method used:   Faxed to ...       [facility] (mail-order)       [address]       [place], [place]  [address]       Ph: [phone]       Fax: [phone]   RxID:   [id number]

## 2010-06-21 NOTE — Letter (Signed)
Summary: Appointment - Reminder 2  [facility], Main Office  [address]   [place], [place] [address]   Phone: [phone]  Fax: [phone]     February 03, 2009 MRN: [id number]   [facility] [address] [place], [place]  [address]   Dear Mr. Touch,  Our records indicate that it is time to schedule a follow-up appointment with Dr. Tenny Craw. It is very important that we reach you to schedule this appointment. We look forward to participating in your health care needs.   Please contact us at the number listed above at your earliest convenience to schedule your appointment.  If you are unable to make an appointment at this time, give [place] a call so we can update our records.  Sincerely,   [facility] Scheduling Team

## 2010-06-21 NOTE — Assessment & Plan Note (Signed)
Summary: 3 week f/u//fd   Vital Signs:  Patient profile:   77 year old male Height:      62 inches Weight:      123.4 pounds BMI:     22.65 Temp:     97.4 degrees F BP sitting:   110 / 60  (left arm) Cuff size:   regular  Vitals Entered By: Shary Decamp (January 04, 2009 11:52 AM) CC: f/u from 7/16 visit Comments  - pt still not feeling well -- c/o of problems with his voice Shary Decamp  January 04, 2009 11:53 AM    History of Present Illness: follow-up her last office visit. continual wit hoarseness. Continue with a  ill-defined fatigue  Allergies: No Known Drug Allergies  Review of Systems       denies chest pain or shortness of breath. No cough. No nausea, vomiting, or diarrhea.  No blood  in the stools  Physical Exam  General:  alert and well-developed.   Mouth:  no obvious  lesions in the gums or tongue.  He has dentures Neck:  no thyromegaly and no thyroid nodules or tenderness.   persistent LAD @ lateral L neck ; the area of concern is about 1.5 times 1 cm, not very mobile supraclavicular area w/o mass  Lungs:  normal respiratory effort, no intercostal retractions, no accessory muscle use, and normal breath sounds.   Heart:  normal rate, regular rhythm, no murmur, and no gallop.     Impression & Recommendations:  Problem # 1:  HOARSENESS (ICD-784.49) Assessment Unchanged refer to ENT Orders: T-2 View CXR (71020TC) ENT Referral (ENT)  Problem # 2:  SWELLING MASS OR LUMP IN HEAD AND NECK (ICD-784.2)  question of a mass in the left lateral neck. Refer to ENT  Orders: ENT Referral (ENT)  Problem # 3:  FATIGUE (ICD-780.79)  labs   Orders: TLB-B12 + Folate Pnl (04540_98119-J47/WGN) TLB-CBC Platelet - w/Differential (85025-CBCD) TLB-BMP (Basic Metabolic Panel-BMET) (80048-METABOL)  Complete Medication List: 1)  Benazepril-hydrochlorothiazide 20-12.5 Mg Tabs (Benazepril-hydrochlorothiazide) .... 2 by mouth once daily in [place]orning 2)  Synthroid  100 Mcg Tabs (Levothyroxine sodium) .... Take 1 tablet by mouth once a day 3)  Nexium 40 Mg Cpdr (Esomeprazole magnesium) .... Per ent 4)  Zyrtec Allergy 10 Mg Tabs (Cetirizine hcl) .... Per ent 5)  Flonase 50 Mcg/act Susp (Fluticasone propionate) .... 2 sprays each nostil once daily prn 6)  Clobetasol Propionate 0.05 % Crea (Clobetasol propionate) .... Apply for 10 days to both hands 7)  Fosamax 70 Mg Tabs (Alendronate sodium) .Marland Kitchen.. 1 tablet a week 8)  Oscal/d  9)  Multivitamins Tabs (Multiple vitamin) 10)  Cialis 10 Mg Tabs (Tadalafil) .Marland Kitchen.. 1 by mouth every other day as needed 11)  Temazepam 7.5 Mg Caps (Temazepam) .Marland Kitchen.. 1 or 2 by mouth at bedtime as needed

## 2010-06-21 NOTE — Assessment & Plan Note (Signed)
Summary: 3 WEEK FOLLOWUP; 90 DAY MED REFILL///SPH   Vital Signs:  Patient profile:   77 year old male Weight:      128.50 pounds Pulse rate:   74 / minute Pulse rhythm:   regular BP sitting:   128 / 70  (left arm) Cuff size:   regular  Vitals Entered By: Army Fossa CMA (December 06, 2009 2:55 PM) CC: Pt here for 3 week follow up   History of Present Illness: followup from last office visit ACE inhibitors were changed to ARB's Cough has decreased, not completely gone.  when he has the cough is usually after eating. Denies any "choking feeling"  Current Medications (verified): 1)  Losartan Potassium-Hctz 50-12.5 Mg Tabs (Losartan Potassium-Hctz) .... One By Mouth Daily 2)  Synthroid 100 Mcg Tabs (Levothyroxine Sodium) .... Take 1 Tablet By Mouth Once A Day 3)  Nexium 40 Mg  Cpdr (Esomeprazole Magnesium) .... Per Ent 4)  Kls Aller-Tec 10 Mg Tabs (Cetirizine Hcl) .... As Needed 5)  Flonase 50 Mcg/act  Susp (Fluticasone Propionate) .... 2 Sprays Each Nostil Once Daily Prn 6)  Fosamax 70 Mg Tabs (Alendronate Sodium) .Marland Kitchen.. 1 Tablet A Week 7)  Multivitamins   Tabs (Multiple Vitamin) 8)  Oscal 500/200 D-3 500-200 Mg-Unit Tabs (Calcium-Vitamin D) .Marland Kitchen.. 1 By Mouth Daily 9)  Omega-3 .... Take Once Daily  Allergies (verified): No Known Drug Allergies  Past History:  Past Medical History: Reviewed history from 11/15/2009 and no changes required. Hypertension Hypothyroidism EPISTAXIS-- f/u per ENT h/o ADENOCARCINOMA, COLON status post right hemicolectomy and  chemotherapy.  F/U with the cancer center and Dr. Ezzard Standing routinely. Had a Cscope Ezzard Standing) 2005 and 2008  INSOMNIA  ECZEMA  LEG CRAMPS  HAY FEVER   ERECTILE DYSFUNCTION  Osteopenia-- per DEXA 07-2008 (Rx fosamax)  Past Surgical History: Reviewed history from 08/28/2006 and no changes required. Inguinal herniorrhaphy  Social History: Reviewed history from 11/15/2009 and no changes required. original from  [place] Married veggetarian two children tobacco--never ETOH--no  Review of Systems       no chest pain, shortness of breath No fevers Ambulatory BPs around 120/75  Physical Exam  General:  alert, well-developed, and well-nourished.   Lungs:  normal respiratory effort, no intercostal retractions, no accessory muscle use, and normal breath sounds.   Heart:  normal rate, regular rhythm, no murmur, and no gallop.     Impression & Recommendations:  Problem # 1:  COUGH (ICD-786.2) slightly better will let me know if not completely gone when he comes back  Problem # 2:  HYPERTENSION (ICD-401.9)  doing very well on losartan, cough decreasing  labs His updated medication list for this problem includes:    Losartan Potassium-hctz 50-12.5 Mg Tabs (Losartan potassium-hctz) ..... One by mouth daily  BP today: 128/70 Prior BP: 110/52 (11/15/2009)  Labs Reviewed: K+: 4.0 (04/05/2009) Creat: : 1.0 (04/05/2009)   Chol: 204 (04/05/2009)   HDL: 38.90 (04/05/2009)   LDL: DEL (07/23/2008)   TG: 461.0 (04/05/2009)  Orders: Venipuncture (27062) TLB-BMP (Basic Metabolic Panel-BMET) (80048-METABOL) Specimen Handling (37628)  Problem # 3:  HYPOTHYROIDISM (ICD-244.9) last TSH normal, needs a refill His updated medication list for this problem includes:    Synthroid 100 Mcg Tabs (Levothyroxine sodium) .Marland Kitchen... Take 1 tablet by mouth once a day  Labs Reviewed: TSH: 1.00 (11/15/2009)    Chol: 204 (04/05/2009)   HDL: 38.90 (04/05/2009)   LDL: DEL (07/23/2008)   TG: 461.0 (04/05/2009)  Complete Medication List: 1)  Losartan Potassium-hctz 50-12.5 Mg  Tabs (Losartan potassium-hctz) .... One by mouth daily 2)  Synthroid 100 Mcg Tabs (Levothyroxine sodium) .... Take 1 tablet by mouth once a day 3)  Nexium 40 Mg Cpdr (Esomeprazole magnesium) .... Per ent 4)  Kls Aller-tec 10 Mg Tabs (Cetirizine hcl) .... As needed 5)  Flonase 50 Mcg/act Susp (Fluticasone propionate) .... 2 sprays each nostil once  daily prn 6)  Fosamax 70 Mg Tabs (Alendronate sodium) .Marland Kitchen.. 1 tablet a week 7)  Multivitamins Tabs (Multiple vitamin) 8)  Oscal 500/200 D-3 500-200 Mg-unit Tabs (Calcium-vitamin d) .Marland Kitchen.. 1 by mouth daily 9)  Omega-3  .... Take once daily 10)  Viagra 100 Mg Tabs (Sildenafil citrate) .... 1/2 or 1 tab no more often than once a day  Patient Instructions: 1)  Please schedule a follow-up appointment in 4 to 6 months .  Prescriptions: VIAGRA 100 MG TABS (SILDENAFIL CITRATE) 1/2 or 1 tab no more often than once a day  #10 x 3   Entered and Authorized by:   Elita Quick E. Briceida Rasberry MD   Signed by:   Nolon Rod. Biran Mayberry MD on 12/06/2009   Method used:   Print then Give to Patient   RxID:   [id number] SYNTHROID 100 MCG TABS (LEVOTHYROXINE SODIUM) Take 1 tablet by mouth once a day  #90 x 3   Entered and Authorized by:   Nolon Rod. Kylan Veach MD   Signed by:   Nolon Rod. Alize Acy MD on 12/06/2009   Method used:   Print then Mail to Patient   RxID:   [id number] LOSARTAN POTASSIUM-HCTZ 50-12.5 MG TABS (LOSARTAN POTASSIUM-HCTZ) one by mouth daily  #90 x 3   Entered and Authorized by:   Nolon Rod. Yocelin Vanlue MD   Signed by:   Nolon Rod. Romero Letizia MD on 12/06/2009   Method used:   Print then Mail to Patient   RxID:   [id number]   Laboratory Results

## 2010-06-21 NOTE — Assessment & Plan Note (Signed)
Summary: 8 mo f/u/cy  Medications Added KLS ALLER-TEC 10 MG TABS (CETIRIZINE HCL) as needed OSCAL 500/200 D-3 500-200 MG-UNIT TABS (CALCIUM-VITAMIN D) 1 by mouth daily      Allergies Added: NKDA  History of Present Illness: Patient is a 77 year old with a history of hypertension, hypothyroidism.  I last saw him in March.  At that time he had some complaints of chest pain.  They were atypical but with his age and language issues I ordered a myoview.  It was normal. Since seen, he has done well.  He denies chest pains. He is active with walking.  He uses a treadmill daily for 15 min.  He denies shortness of breath.  Current Medications (verified): 1)  Benazepril-Hydrochlorothiazide 20-12.5 Mg  Tabs (Benazepril-Hydrochlorothiazide) .... 2 By Mouth Once Daily in Themorning 2)  Synthroid 100 Mcg Tabs (Levothyroxine Sodium) .... Take 1 Tablet By Mouth Once A Day 3)  Nexium 40 Mg  Cpdr (Esomeprazole Magnesium) .... Per Ent 4)  Kls Aller-Tec 10 Mg Tabs (Cetirizine Hcl) .... As Needed 5)  Flonase 50 Mcg/act  Susp (Fluticasone Propionate) .... 2 Sprays Each Nostil Once Daily Prn 6)  Fosamax 70 Mg Tabs (Alendronate Sodium) .Marland Kitchen.. 1 Tablet A Week 7)  Multivitamins   Tabs (Multiple Vitamin) 8)  Cialis 10 Mg Tabs (Tadalafil) .Marland Kitchen.. 1 By Mouth Every Other Day As Needed 9)  Temazepam 7.5 Mg Caps (Temazepam) .Marland Kitchen.. 1 or 2 By Mouth At Bedtime As Needed 10)  Oscal 500/200 D-3 500-200 Mg-Unit Tabs (Calcium-Vitamin D) .Marland Kitchen.. 1 By Mouth Daily  Allergies (verified): No Known Drug Allergies  Past History:  Past Medical History: Last updated: 12/04/2008 Hypertension Hypothyroidism EPISTAXIS-- f/u per ENT h/o ADENOCARCINOMA, COLON status post right hemicolectomy and  chemotherapy.  F/U with the cancer center and Dr. Ezzard Standing routinely. Had a Cscope Ezzard Standing) 2005 and 2008) INSOMNIA  ECZEMA  LEG CRAMPS (ICD-729.82) HAY FEVER (ICD-477.0) ERECTILE DYSFUNCTION (ICD-302.72) Osteopenia-- per DEXA 07-2008 (Rx fosamax)   Past Surgical History: Last updated: 08/28/2006 Inguinal herniorrhaphy  Family History: Last updated: 07/23/2008 DM--no MI--no Colon ca--no Prostate ca--no  Social History: Last updated: 07/23/2008 Married two children  Review of Systems       All systems reviewed.  Negative to the above problem except as noted above.  Vital Signs:  Patient profile:   77 year old male Height:      62 inches Weight:      127 pounds BMI:     23.31 Pulse rate:   58 / minute Resp:     16 per minute BP sitting:   120 / 66  (left arm)  Vitals Entered By: Marrion Coy, CNA (April 05, 2009 8:43 AM)  Physical Exam  Additional Exam:  HEENT:  Normocephalic, atraumatic. EOMI, PERRLA.  Neck: JVP is normal. No thyromegaly. No bruits.  Lungs: clear to auscultation. No rales no wheezes.  Heart: Regular rate and rhythm. Normal S1, S2. No S3.  Gr 1/6 systolic murmur. PMI not displaced.  Abdomen:  Supple, nontender. Normal bowel sounds. No masses. No hepatomegaly.  Extremities:   Good distal pulses throughout. No lower extremity edema.  Musculoskeletal :moving all extremities.  Neuro:   alert and oriented x3.    EKG  Procedure date:  04/05/2009  Findings:      Sinus bradycardia.  58 bpm.  Impression & Recommendations:  Problem # 1:  MITRAL VALVE DISORDERS (ICD-424.0)  Mild by echo.  Will follow clinically.  His updated medication list for this problem includes:  Benazepril-hydrochlorothiazide 20-12.5 Mg Tabs (Benazepril-hydrochlorothiazide) .Marland Kitchen... 2 by mouth once daily in [place]orning  Orders: EKG w/ Interpretation (93000) TLB-BMP (Basic Metabolic Panel-BMET) (80048-METABOL)  Problem # 2:  HYPERTRIGLYCERIDEMIA (ICD-272.1) Will repeat lipid panel Orders: TLB-Lipid Panel (80061-LIPID) TLB-AST (SGOT) (84450-SGOT)  Problem # 3:  HYPERTENSION (ICD-401.9) BP is under good control. His updated medication list for this problem includes:    Benazepril-hydrochlorothiazide 20-12.5 Mg  Tabs (Benazepril-hydrochlorothiazide) .Marland Kitchen... 2 by mouth once daily in themorning  Other Orders: TLB-CBC Platelet - w/Differential (85025-CBCD) TLB-TSH (Thyroid Stimulating Hormone) (45409-WJX)  Patient Instructions: 1)  Your physician recommends that you return for lab work in: lab work today .Marland Kitchenwe will call you with results 2)  Your physician wants you to follow-up in: 12 months  You will receive a reminder letter in the mail two months in advance. If you don't receive a letter, please call our office to schedule the follow-up appointment.

## 2010-06-21 NOTE — Assessment & Plan Note (Signed)
Summary: acute only loss voice//fd   Vital Signs:  Patient profile:   77 year old male Height:      62 inches Weight:      123.8 pounds Temp:     97.8 degrees F oral BP sitting:   102 / 60  Vitals Entered By: Shary Decamp (December 04, 2008 8:03 AM) CC: acute only Comments  - hoarse x 1 week Shary Decamp  December 04, 2008 8:15 AM    History of Present Illness: hoarseness x 1 week denies URI type of symptoms, fever, GERD symptoms  slightly  tired x 2 weeks denies CP-SOB, no LE edema  likes thyroid check   Allergies: No Known Drug Allergies  Past History:  Past Medical History: Hypertension Hypothyroidism EPISTAXIS-- f/u per ENT h/o ADENOCARCINOMA, COLON status post right hemicolectomy and  chemotherapy.  F/U with the cancer center and Dr. Ezzard Standing routinely. Had a Cscope Ezzard Standing) 2005 and 2008) INSOMNIA  ECZEMA  LEG CRAMPS (ICD-729.82) HAY FEVER (ICD-477.0) ERECTILE DYSFUNCTION (ICD-302.72) Osteopenia-- per DEXA 07-2008 (Rx fosamax)  Past Surgical History: Reviewed history from 08/28/2006 and no changes required. Inguinal herniorrhaphy  Review of Systems      See HPI  Physical Exam  General:  alert and well-developed.   Head:  normocephalic and atraumatic.   Ears:  R ear normal and L ear normal.   Nose:  no nasal discharge.   Mouth:  no gingival abnormalities and pharynx pink and moist.   Neck:  no thyromegaly and no thyroid nodules or tenderness.   question of LAD @ lateral L neck  supraclavicular area w/o mass  Lungs:  normal respiratory effort, no intercostal retractions, no accessory muscle use, and normal breath sounds.   Heart:  normal rate, regular rhythm, no murmur, and no gallop.     Impression & Recommendations:  Problem # 1:  HOARSENESS (ICD-784.49) new, x 1 week ROS neg question of LAD L neck plan : observe, re asses in 2 -3 weeks ?CXR, ?LAB  ENT referal if question of LAD persists  Problem # 2:  INSOMNIA (ICD-780.52) also request temazepam  for sleep,states it helped in the past, request 15 mg but I'll Rx 7.5 and encouraged to use as little as poss. His updated medication list for this problem includes:    Temazepam 7.5 Mg Caps (Temazepam) .Marland Kitchen... 1 or 2 by mouth at bedtime as needed  Complete Medication List: 1)  Benazepril-hydrochlorothiazide 20-12.5 Mg Tabs (Benazepril-hydrochlorothiazide) .... 2 by mouth once daily in themorning 2)  Synthroid 100 Mcg Tabs (Levothyroxine sodium) .... Take 1 tablet by mouth once a day 3)  Nexium 40 Mg Cpdr (Esomeprazole magnesium) .... Per ent 4)  Zyrtec Allergy 10 Mg Tabs (Cetirizine hcl) .... Per ent 5)  Flonase 50 Mcg/act Susp (Fluticasone propionate) .... 2 sprays each nostil once daily prn 6)  Clobetasol Propionate 0.05 % Crea (Clobetasol propionate) .... Apply for 10 days to both hands 7)  Fosamax 70 Mg Tabs (Alendronate sodium) .Marland Kitchen.. 1 tablet a week 8)  Oscal/d  9)  Multivitamins Tabs (Multiple vitamin) 10)  Cialis 10 Mg Tabs (Tadalafil) .Marland Kitchen.. 1 by mouth every other day as needed 11)  Temazepam 7.5 Mg Caps (Temazepam) .Marland Kitchen.. 1 or 2 by mouth at bedtime as needed  Patient Instructions: 1)  Please schedule a follow-up appointment in 2 to 3  weeks.  Prescriptions: TEMAZEPAM 7.5 MG CAPS (TEMAZEPAM) 1 or 2 by mouth at bedtime as needed  #45 x 0   Entered and Authorized by:  Juliocesar Blasius E. Anival Pasha MD   Signed by:   Nolon Rod. Tekeyah Santiago MD on 12/04/2008   Method used:   Print then Give to Patient   RxID:   [id number]

## 2010-06-21 NOTE — Letter (Signed)
Summary: [facility]  [facility]   Imported By: Charolette Child 09/06/2007 16:11:58  _____________________________________________________________________  External Attachment:    Type:   Image     Comment:   External Document

## 2010-06-21 NOTE — Assessment & Plan Note (Signed)
Summary: GET THYROID CHECK//VGJ   Vital Signs:  Patient Profile:   77 Years Old Male Height:     62 inches Weight:      128 pounds Pulse rate:   76 / minute BP sitting:   132 / 80  Vitals Entered By: Shary Decamp (October 30, 2007 11:23 AM)                 Chief Complaint:  rov - due tsh.  History of Present Illness: TSH check for few weeks he noted something in his penis no pain or d/c    Prior Medications Reviewed Using: Patient Recall  Prior Medication List:  BENAZEPRIL-HYDROCHLOROTHIAZIDE 20-12.5 MG  TABS (BENAZEPRIL-HYDROCHLOROTHIAZIDE) 2 by mouth once daily in themorning SYNTHROID 100 MCG TABS (LEVOTHYROXINE SODIUM) Take 1 tablet by mouth once a day NEXIUM 40 MG  CPDR (ESOMEPRAZOLE MAGNESIUM) per ent ZYRTEC ALLERGY 10 MG  TABS (CETIRIZINE HCL) per ent FLONASE 50 MCG/ACT  SUSP (FLUTICASONE PROPIONATE) 2 sprays each nostil once daily prn CLOBETASOL PROPIONATE 0.05 %  CREA (CLOBETASOL PROPIONATE) apply for 10 days to both hands MULTIVITAMINS   TABS (MULTIPLE VITAMIN)  * ALLERCLEAR  * GLUCOSAMINE  * OSTO-BI-FLEX  * OSCAL/D    Current Allergies (reviewed today): No known allergies   Past Medical History:    Reviewed history from 08/28/2006 and no changes required:       Hypertension       Hypothyroidism       EPISTAXIS-- f/u per ENT       h/o ADENOCARCINOMA, COLON status post right hemicolectomy.  Status post chemotherapy.  F/U with the cancer center and Dr. Ezzard Standing routinely.       INSOMNIA        ECZEMA (ICD-692.9)       LEG CRAMPS (ICD-729.82)       HAY FEVER (ICD-477.0)       ERECTILE DYSFUNCTION (ICD-302.72)            Review of Systems  CV      Denies chest pain or discomfort and swelling of feet.  Resp      Denies shortness of breath.   Physical Exam  General:     alert and well-developed.   Lungs:     normal respiratory effort, no intercostal retractions, no accessory muscle use, and normal breath sounds.   Heart:     normal rate,  regular rhythm, and no murmur.   Genitalia:     at glans has a 3 mm round , dry hard structure, it was pulled. Skin at that area was normal. Under the microscope we realized that the thing is a  dry tick    Impression & Recommendations:  Problem # 1:  HYPOTHYROIDISM (ICD-244.9) Assessment: Unchanged  His updated medication list for this problem includes:    Synthroid 100 Mcg Tabs (Levothyroxine sodium) .Marland Kitchen... Take 1 tablet by mouth once a day  Orders: TLB-TSH (Thyroid Stimulating Hormone) (84443-TSH) Venipuncture (95621)   Problem # 2:  HYPERTENSION (ICD-401.9) Assessment: Unchanged  His updated medication list for this problem includes:    Benazepril-hydrochlorothiazide 20-12.5 Mg Tabs (Benazepril-hydrochlorothiazide) .Marland Kitchen... 2 by mouth once daily in themorning  Orders: TLB-BMP (Basic Metabolic Panel-BMET) (80048-METABOL)   Problem # 3:  tick bite no rash advised to call if he develops a rash at penis  Complete Medication List: 1)  Benazepril-hydrochlorothiazide 20-12.5 Mg Tabs (Benazepril-hydrochlorothiazide) .... 2 by mouth once daily in themorning 2)  Synthroid 100 Mcg Tabs (Levothyroxine sodium) .... Take 1  tablet by mouth once a day 3)  Nexium 40 Mg Cpdr (Esomeprazole magnesium) .... Per ent 4)  Zyrtec Allergy 10 Mg Tabs (Cetirizine hcl) .... Per ent 5)  Flonase 50 Mcg/act Susp (Fluticasone propionate) .... 2 sprays each nostil once daily prn 6)  Clobetasol Propionate 0.05 % Crea (Clobetasol propionate) .... Apply for 10 days to both hands 7)  Multivitamins Tabs (Multiple vitamin) 8)  Allerclear  9)  Glucosamine  10)  Osto-bi-flex  11)  Oscal/d    Patient Instructions: 1)  Please schedule a follow-up appointment in 4 to 6 months.   ]

## 2010-06-21 NOTE — Progress Notes (Signed)
Summary: Refill Request  Phone Note Refill Request Call back at [phone] Message from:  Pharmacy on October 22, 2009 10:25 AM  Refills Requested: Medication #1:  BENAZEPRIL-HYDROCHLOROTHIAZIDE 20-12.5 MG  TABS 2 by mouth once daily in themorning   Dosage confirmed as above?Dosage Confirmed   Supply Requested: 3 months [facility]  Next Appointment Scheduled: 6.27.11 Initial call taken by: Harold Barban,  October 22, 2009 10:25 AM    New/Updated Medications: BENAZEPRIL-HYDROCHLOROTHIAZIDE 20-12.5 MG  TABS (BENAZEPRIL-HYDROCHLOROTHIAZIDE) 2 by mouth once daily in themorning*OFFICE VISIT DUE NOW 88 Prescriptions: BENAZEPRIL-HYDROCHLOROTHIAZIDE 20-12.5 MG  TABS (BENAZEPRIL-HYDROCHLOROTHIAZIDE) 2 by mouth once daily in themorning*OFFICE VISIT DUE NOW 88  #90 x 0   Entered by:   Jeremy Johann CMA   Authorized by:   Nolon Rod. Paz MD   Signed by:   Jeremy Johann CMA on 10/22/2009   Method used:   Faxed to ...       [facility]* (mail-order)       [address]       [place], [place]  [address]       Ph: [phone]       Fax: [phone]   RxID:   [id number]

## 2010-06-21 NOTE — Progress Notes (Signed)
Summary: lab/cxr results  Phone Note Outgoing Call Call back at [facility] Phone [phone]   Summary of Call: Discussed with pt the following lab results:  - LFTs elevated -- advised pt to stop all non-rx medications/supplements  - recheck LFTs in 3 weeks  - CXR - ok .Shary Decamp  July 28, 2008 2:38 PM

## 2010-06-21 NOTE — Assessment & Plan Note (Signed)
Summary: ro4--back pain//ph   Vital Signs:  Patient Profile:   77 Years Old Male Height:     62 inches Weight:      127.6 pounds Temp:     98.1 degrees F BP sitting:   140 / 80  Vitals Entered By: Shary Decamp (May 20, 2008 11:07 AM)                 Chief Complaint:  still having lower back pain; increased pain after sitting for an extended time.  History of Present Illness: still having lower back pain; increased pain after sitting for an extended time no radiation    Prior Medication List:  BENAZEPRIL-HYDROCHLOROTHIAZIDE 20-12.5 MG  TABS (BENAZEPRIL-HYDROCHLOROTHIAZIDE) 2 by mouth once daily in themorning SYNTHROID 100 MCG TABS (LEVOTHYROXINE SODIUM) Take 1 tablet by mouth once a day NEXIUM 40 MG  CPDR (ESOMEPRAZOLE MAGNESIUM) per ent ZYRTEC ALLERGY 10 MG  TABS (CETIRIZINE HCL) per ent FLONASE 50 MCG/ACT  SUSP (FLUTICASONE PROPIONATE) 2 sprays each nostil once daily prn CLOBETASOL PROPIONATE 0.05 %  CREA (CLOBETASOL PROPIONATE) apply for 10 days to both hands MULTIVITAMINS   TABS (MULTIPLE VITAMIN)  * ALLERCLEAR  * GLUCOSAMINE  * OSTO-BI-FLEX  * OSCAL/D    Current Allergies (reviewed today): No known allergies   Past Medical History:    Reviewed history from 10/30/2007 and no changes required:       Hypertension       Hypothyroidism       EPISTAXIS-- f/u per ENT       h/o ADENOCARCINOMA, COLON status post right hemicolectomy.  Status post chemotherapy.  F/U with the cancer center and Dr. Ezzard Standing routinely.       INSOMNIA        ECZEMA (ICD-692.9)       LEG CRAMPS (ICD-729.82)       HAY FEVER (ICD-477.0)       ERECTILE DYSFUNCTION (ICD-302.72)         Past Surgical History:    Reviewed history from 08/28/2006 and no changes required:       Inguinal herniorrhaphy     Review of Systems       BMs normal, no blood in stools no urinary symptoms, dysuria or diff. urinating   Physical Exam  General:     alert and well-developed.   Rectal:  No external abnormalities noted. Normal sphincter tone. No rectal masses or tenderness. Prostate:     no gland enlargement, no nodules, and no asymmetry.   Neurologic:     alert & oriented X3, strength normal in all extremities, gait normal, and DTRs symmetrical and normal.   Skin:     skin around the coccyx normal    Impression & Recommendations:  Problem # 1:  COCCYGEAL PAIN (ICD-724.79) aleve 220mg s  OTC , see instructions  refer to ortho Orders: Orthopedic Referral (Ortho)   Complete Medication List: 1)  Benazepril-hydrochlorothiazide 20-12.5 Mg Tabs (Benazepril-hydrochlorothiazide) .... 2 by mouth once daily in themorning 2)  Synthroid 100 Mcg Tabs (Levothyroxine sodium) .... Take 1 tablet by mouth once a day 3)  Nexium 40 Mg Cpdr (Esomeprazole magnesium) .... Per ent 4)  Zyrtec Allergy 10 Mg Tabs (Cetirizine hcl) .... Per ent 5)  Flonase 50 Mcg/act Susp (Fluticasone propionate) .... 2 sprays each nostil once daily prn 6)  Clobetasol Propionate 0.05 % Crea (Clobetasol propionate) .... Apply for 10 days to both hands 7)  Multivitamins Tabs (Multiple vitamin) 8)  Allerclear  9)  Glucosamine  10)  Osto-bi-flex  11)  Oscal/d    Patient Instructions: 1)  Aleve 220 OTC one tablet two times a day , stop if you have stomach pain, nausea  2)  Please schedule a  YEARLY in 2 months.Came back fasting    ]

## 2010-06-21 NOTE — Progress Notes (Signed)
Summary: fosamax rx ready at [facility] [address]  Phone Note Call from Patient Call back at [facility] Phone [phone]   Caller: Patient Summary of Call: PATIENT BROUGHT IN PRESCRIPTION FOR FOSAMAX GIVEN TO HIM TODAY----HE SAYS HE WANTS A NEW PRESCRIPTION WRITTEN FOR SAME MEDICATION THAT HIS WIFE IS TAKING---GENERIC FOR FOSAMAX 70 MG = ALENDRONATE SODIUM 70MG   PLEASE CALL HIM WHEN NEW PRESCRIPTION IS READY FOR PICKUP  WILL SEND PAPERWORK TO [facility] Initial call taken by: Jerolyn Shin,  August 26, 2008 12:50 PM  Follow-up for Phone Call        script that was written could have been for generic Dr Drue Novel wrote in product selection permitted which mean generic, pt left script her, Rx called into pharmacypharmacist says pt gets 3 mos supply so will fill for #12 with 1 refill.  Left msg for pt rx ready at [facility] on [address] Follow-up by: Kandice Hams,  August 26, 2008 2:44 PM

## 2010-06-21 NOTE — Assessment & Plan Note (Signed)
Summary: flu like symptoms,cbs   Vital Signs:  Patient Profile:   77 Years Old Male Height:     62 inches Weight:      132.8 pounds Temp:     97.8 degrees F oral BP sitting:   140 / 80  Vitals Entered By: Shary Decamp (April 23, 2007 1:55 PM)                 Chief Complaint:  PRODUCTIVE OCUGH (YELLOW); FEVER @ NIGHT X 4-5 DAYS.  History of Present Illness: as above , concer about pneumonia  Current Allergies (reviewed today): No known allergies      Review of Systems  Resp      no CP, just "tight" + chest congestion  GI      Denies diarrhea and vomiting.      some nausea   Physical Exam  General:     alert and well-developed.   Ears:     nl Nose:     nl Mouth:     no red Lungs:     normal respiratory effort, no intercostal retractions, no accessory muscle use, no crackles, and no wheezes.   Heart:     normal rate, regular rhythm, and no murmur.      Impression & Recommendations:  Problem # 1:  FEVER (ICD-780.6) likely viral illness check CXR OTCs , see instructions Orders: T-2 View CXR (71020TC)   Problem # 2:  HYPERTENSION (ICD-401.9) recently switch to ACEi due to cost now w/ cough, lilkely related to febrile illnes  His updated medication list for this problem includes:    Benazepril-hydrochlorothiazide 20-12.5 Mg Tabs (Benazepril-hydrochlorothiazide) .Marland Kitchen... 2 by mouth once daily in themorning  Orders: TLB-BMP (Basic Metabolic Panel-BMET) (80048-METABOL) Venipuncture (98119)   Complete Medication List: 1)  Synthroid 100 Mcg Tabs (Levothyroxine sodium) .... Take 1 tablet by mouth once a day 2)  Nexium 40 Mg Cpdr (Esomeprazole magnesium) .... Per ent 3)  Zyrtec Allergy 10 Mg Tabs (Cetirizine hcl) .... Per ent 4)  Multivitamins Tabs (Multiple vitamin) 5)  Allerclear  6)  Glucosamine  7)  Osto-bi-flex  8)  Oscal/d  9)  Flonase 50 Mcg/act Susp (Fluticasone propionate) .... 2 sprays each nostil once daily prn 10)  Clobetasol  Propionate 0.05 % Crea (Clobetasol propionate) .... Apply for 10 days to both hands 11)  Benazepril-hydrochlorothiazide 20-12.5 Mg Tabs (Benazepril-hydrochlorothiazide) .... 2 by mouth once daily in themorning   Patient Instructions: 1)  Tylenol as needed for fever 2)  lots of fluids 3)  for cough: robitussin DM over the counter 4)  get your chext XR 5)  Please schedule a follow-up appointment in 3 months. 6)  call if no better next week or if you get worse    Prescriptions: BENAZEPRIL-HYDROCHLOROTHIAZIDE 20-12.5 MG  TABS (BENAZEPRIL-HYDROCHLOROTHIAZIDE) 2 by mouth once daily in themorning  #90 x 3   Entered and Authorized by:   Elita Quick E. Merril Isakson MD   Signed by:   Nolon Rod. Marzell Allemand MD on 04/23/2007   Method used:   Print then Give to Patient   RxID:   [id number]  ]

## 2010-06-21 NOTE — Progress Notes (Signed)
Summary: talk to nurse   Phone Note Call from Patient Call back at Home Phone [phone]   Caller: Patient Reason for Call: Talk to Nurse Summary of Call: pt states that she had bloodwork done and is wondering due to the labs if she still needed to have the stress test done on wednesday.  Initial call taken by: Edman Circle,  April 01, 2010 2:55 PM  Follow-up for Phone Call        04/01/10--1530--called number listed and spoke to mr Cozzens, who stated he did not call our office--when asked if his wife was home he stated "no" --his english is not understandable at this time so i could not continue as he seemed upset--nt Follow-up by: Ledon Snare, RN,  April 01, 2010 3:37 PM

## 2010-06-21 NOTE — Letter (Signed)
Summary: colon ca, f/u 1 year  [facility]   Imported By: Lanelle Bal 06/23/2009 09:49:44  _____________________________________________________________________  External Attachment:    Type:   Image     Comment:   External Document

## 2010-06-21 NOTE — Miscellaneous (Signed)
Summary: Orders Update   Clinical Lists Changes  Orders: Added new Referral order of Gastroenterology Referral (GI) - Signed 

## 2010-06-21 NOTE — Progress Notes (Signed)
Summary: REFILL  Phone Note Call from Patient Call back at Home Phone [phone]   Caller: Patient Summary of Call: PT WALKS IN FOR A REFILL ON BENAZAPRIL. SPOKE WITH DANIELLE AND PT IS NOT SUPPOSED TO BE TAKING THIS MEDICATION. PLEASE ADVISE. PT STATES THIS MEDICATION IS WORKING FOR HIM AND HE HASNT STARTED TAKING THE LOSSARTAN. [phone]. Initial call taken by: Lavell Islam,  February 17, 2010 11:50 AM  Follow-up for Phone Call        according to our notes, he was switched to losartan. If he didn't, then go ahead and switch him. Office visit in 2 weeks Follow-up by: Raea Magallon E. Avabella Wailes MD,  February 17, 2010 3:08 PM  Additional Follow-up for Phone Call Additional follow up Details #1::        Pt is aware, will pick up and make appt. Army Fossa CMA  February 17, 2010 3:25 PM     Prescriptions: LOSARTAN POTASSIUM-HCTZ 50-12.5 MG TABS (LOSARTAN POTASSIUM-HCTZ) one by mouth daily  #90 x 3   Entered by:   Army Fossa CMA   Authorized by:   Nolon Rod. Daivon Rayos MD   Signed by:   Army Fossa CMA on 02/17/2010   Method used:   Print then Give to Patient   RxID:   [id number]

## 2010-06-21 NOTE — Letter (Signed)
Summary: [facility]   Imported By: Freddy Jaksch 11/29/2006 10:38:28  _____________________________________________________________________  External Attachment:    Type:   Image     Comment:   [facility]

## 2010-06-21 NOTE — Assessment & Plan Note (Signed)
Summary: 6MONTHS///TL   Vital Signs:  Patient Profile:   77 Years Old Male Height:     62 inches Weight:      132.2 pounds Pulse rate:   64 / minute Pulse rhythm:   regular BP sitting:   160 / 92  (left arm) Cuff size:   regular  Vitals Entered By: Shary Decamp (April 08, 2007 8:04 AM)                 Chief Complaint:  rov.  History of Present Illness: occ high BP callous at the  L 5th toe  Current Allergies (reviewed today): No known allergies      Review of Systems       + low salt diet  CV      Denies chest pain or discomfort and swelling of feet.  Resp      Denies chest pain with inspiration and cough.   Physical Exam  General:     alert and well-developed.   Lungs:     normal respiratory effort, no intercostal retractions, and no accessory muscle use.   Heart:     normal rate, regular rhythm, and no murmur.   Extremities:     5th Left toe: tip has a hard callous    Impression & Recommendations:  Problem # 1:  HYPERTENSION (ICD-401.9) BP well control? change to benazepril HCT (for better control and cost)  The following medications were removed from the medication list:    Diovan 160 Mg Tabs (Valsartan) .Marland Kitchen... Take 1 tablet by mouth once a day  His updated medication list for this problem includes:    Benazepril-hydrochlorothiazide 20-12.5 Mg Tabs (Benazepril-hydrochlorothiazide) .Marland Kitchen... 2 by mouth once daily in themorning  BP today: 160/92 Prior BP: 142/74 (02/22/2007)  Labs Reviewed: Creat: 1.1 (06/06/2006) Chol: 180 (08/28/2006)   HDL: 36.2 (08/28/2006)   LDL: DEL (08/28/2006)   TG: 437 (08/28/2006)   Problem # 2:  HYPOTHYROIDISM (ICD-244.9)  His updated medication list for this problem includes:    Synthroid 100 Mcg Tabs (Levothyroxine sodium) .Marland Kitchen... Take 1 tablet by mouth once a day  Labs Reviewed: TSH: 1.15 (08/28/2006)    Chol: 180 (08/28/2006)   HDL: 36.2 (08/28/2006)   LDL: DEL (08/28/2006)   TG: 437 (08/28/2006)    Problem # 3:  contact person Pt's son is Toni Amend MD Chief, Division of Hematology [facility] [place] [facility] [phone] Fax [phone]  Problem # 4:  callous: advice to see a podiatrist  Complete Medication List: 1)  Synthroid 100 Mcg Tabs (Levothyroxine sodium) .... Take 1 tablet by mouth once a day 2)  Nexium 40 Mg Cpdr (Esomeprazole magnesium) .... Per ent 3)  Zyrtec Allergy 10 Mg Tabs (Cetirizine hcl) .... Per ent 4)  Multivitamins Tabs (Multiple vitamin) 5)  Allerclear  6)  Glucosamine  7)  Osto-bi-flex  8)  Oscal/d  9)  Flonase 50 Mcg/act Susp (Fluticasone propionate) .... 2 sprays each nostil once daily prn 10)  Clobetasol Propionate 0.05 % Crea (Clobetasol propionate) .... Apply for 10 days to both hands 11)  Benazepril-hydrochlorothiazide 20-12.5 Mg Tabs (Benazepril-hydrochlorothiazide) .... 2 by mouth once daily in themorning   Patient Instructions: 1)  stop Diovan 2)  Start benazepril 2 tablets  a day  in the morning 3)  came back in 3 weeks for a nurse visit: bloop pressure check and blood work (non fasting) 4)  BMP TSH  Dx HTN hypothyroid    Prescriptions: BENAZEPRIL-HYDROCHLOROTHIAZIDE 20-12.5  MG  TABS (BENAZEPRIL-HYDROCHLOROTHIAZIDE) 2 by mouth once daily in themorning  #60 x 1   Entered and Authorized by:   Elita Quick E. Jamond Neels MD   Signed by:   Nolon Rod. Yazmyne Sara MD on 04/08/2007   Method used:   Print then Give to Patient   RxID:   [id number]  ]

## 2010-06-21 NOTE — Assessment & Plan Note (Signed)
Summary: flu shot//tl  Nurse Visit    Prior Medications: DIOVAN 160 MG TABS (VALSARTAN) Take 1 tablet by mouth once a day SYNTHROID 100 MCG TABS (LEVOTHYROXINE SODIUM) Take 1 tablet by mouth once a day NEXIUM 40 MG  CPDR (ESOMEPRAZOLE MAGNESIUM) per ent ZYRTEC ALLERGY 10 MG  TABS (CETIRIZINE HCL) per ent MULTIVITAMINS   TABS (MULTIPLE VITAMIN)  ALLERCLEAR ()  GLUCOSAMINE ()  OSTO-BI-FLEX ()  OSCAL/D ()  FLONASE 50 MCG/ACT  SUSP (FLUTICASONE PROPIONATE) 2 sprays each nostil once daily prn CLOBETASOL PROPIONATE 0.05 %  CREA (CLOBETASOL PROPIONATE) apply for 10 days to both hands Current Allergies: No known allergies    Influenza Vaccine    Vaccine Type: Fluvax MCR    Site: right deltoid    Mfr: Sanofi Pasteur    Dose: 0.5 ml    Route: IM    Given by: Doristine Devoid    Exp. Date: 11/19/2007    Lot #: [id number]  Flu Vaccine Consent Questions    Do you have a history of severe allergic reactions to this vaccine? no    Any prior history of allergic reactions to egg and/or gelatin? no    Do you have a sensitivity to the preservative Thimersol? no    Do you have a past history of Guillan-Barre Syndrome? no    Do you currently have an acute febrile illness? no    Have you ever had a severe reaction to latex? no    Vaccine information given and explained to patient? yes   Orders Added: 1)  Influenza Vaccine MCR [00025]    ]

## 2010-06-21 NOTE — Letter (Signed)
Summary: [facility]   Imported By: Freddy Jaksch 04/25/2007 10:04:47  _____________________________________________________________________  External Attachment:    Type:   Image     Comment:   External Document

## 2010-06-21 NOTE — Letter (Signed)
Summary: New Patient letter  [facility]  [address] [place], [place] [address]   Phone: [phone]  Fax: [phone]       04/21/2010 MRN: [id number]  [facility] [address] [place], [place]  [address]  Dear [name],  Welcome to the Gastroenterology Division at [facility].    You are scheduled to see Dr. Leone Payor on 05/18/2010 at 10:00AM  on the 3rd floor at [facility], [address]  We ask that you try to arrive at our office 15 minutes prior to your appointment time to allow for check-in.  We would like you to complete the enclosed self-administered evaluation form prior to your visit and bring it with you on the day of your appointment.  We will review it with you.  Also, please bring a complete list of all your medications or, if you prefer, bring the medication bottles and we will list them.  Please bring your insurance card so that we may make a copy of it.  If your insurance requires a referral to see a specialist, please bring your referral form from your primary care physician.  Co-payments are due at the time of your visit and may be paid by cash, check or credit card.     Your office visit will consist of a consult with your physician (includes a physical exam), any laboratory testing he/she may order, scheduling of any necessary diagnostic testing (e.g. x-ray, ultrasound, CT-scan), and scheduling of a procedure (e.g. Endoscopy, Colonoscopy) if required.  Please allow enough time on your schedule to allow for any/all of these possibilities.    If you cannot keep your appointment, please call [phone] to cancel or reschedule prior to your appointment date.  This allows [place] the opportunity to schedule an appointment for another patient in need of care.  If you do not cancel or reschedule by 5 p.m. the business day prior to your appointment date, you will be charged a $50.00 late cancellation/no-show fee.    Thank you for choosing  [facility] for your medical needs.  We appreciate the opportunity to care for you.  Please visit [place] at our website  to learn more about our practice.                     Sincerely,                                                             The Gastroenterology Division

## 2010-06-21 NOTE — Progress Notes (Signed)
Summary: paz--refill  Phone Note Refill Request   Refills Requested: Medication #1:  SYNTHROID 100 MCG TABS Take 1 tablet by mouth once a day  Medication #2:  BENAZEPRIL-HYDROCHLOROTHIAZIDE 20-12.5 MG  TABS 2 by mouth once daily in themorning [facility]--[phone]  Initial call taken by: Freddy Jaksch,  January 07, 2008 8:33 AM      Prescriptions: SYNTHROID 100 MCG TABS (LEVOTHYROXINE SODIUM) Take 1 tablet by mouth once a day  #90 x 3   Entered by:   Ardyth Man   Authorized by:   Nolon Rod. Paz MD   Signed by:   Ardyth Man on 01/07/2008   Method used:   Faxed to ...       [facility]             , [place]         Ph: [phone]       Fax: [phone]   RxID:   [id number] BENAZEPRIL-HYDROCHLOROTHIAZIDE 20-12.5 MG  TABS (BENAZEPRIL-HYDROCHLOROTHIAZIDE) 2 by mouth once daily in themorning  #90 x 3   Entered by:   Ardyth Man   Authorized by:   Nolon Rod. Paz MD   Signed by:   Ardyth Man on 01/07/2008   Method used:   Faxed to ...       [facility]             ,          Ph: [phone]       Fax: [phone]   RxID:   [id number]

## 2010-06-23 NOTE — Assessment & Plan Note (Signed)
Summary: abd pain   History of Present Illness Visit Type: Initial Consult Primary GI MD: Stan Head MD [facility] Primary Provider: Willow Ora, MD Requesting Provider: Willow Ora, MD Chief Complaint: Left Side abdominal pain History of Present Illness:   77 yo [place] man with LUQ pain after eating  New problem. No nausea or vomiting. Has been using Nexium some, at times. It is costl and working off of samples..  Had Doppler US of mesenteric vessels that ws negative. No weight loss. Review of chart shows that he told Dr. Drue Novel he had x 2 months in October. He took Nexium x 1 month and improved or resolved then has had it again for 4 weeks. It is a daily occurrence and multiple times/day. Lasts mins-hours it seems.    GI Review of Systems    Reports abdominal pain, acid reflux, and  bloating.     Location of  Abdominal pain: left side.    Denies belching, chest pain, dysphagia with liquids, dysphagia with solids, heartburn, loss of appetite, nausea, vomiting, vomiting blood, weight loss, and  weight gain.        Denies anal fissure, black tarry stools, change in bowel habit, constipation, diarrhea, diverticulosis, fecal incontinence, heme positive stool, hemorrhoids, irritable bowel syndrome, jaundice, light color stool, liver problems, rectal bleeding, and  rectal pain.    Current Medications (verified): 1)  Losartan Potassium-Hctz 50-12.5 Mg Tabs (Losartan Potassium-Hctz) .... Half Tablet Daily 2)  Synthroid 100 Mcg Tabs (Levothyroxine Sodium) .... Take 1 Tablet By Mouth Once A Day 3)  Nexium 40 Mg  Cpdr (Esomeprazole Magnesium) .... Take One By Mouth As Needed 4)  Kls Aller-Tec 10 Mg Tabs (Cetirizine Hcl) .... As Needed 5)  Flonase 50 Mcg/act  Susp (Fluticasone Propionate) .... 2 Sprays Each Nostil Once Daily Prn 6)  Fosamax 70 Mg Tabs (Alendronate Sodium) .Marland Kitchen.. 1 Tablet A Week 7)  Multivitamins   Tabs (Multiple Vitamin) 8)  Oscal 500/200 D-3 500-200 Mg-Unit Tabs (Calcium-Vitamin D)  .Marland Kitchen.. 1 By Mouth Daily 9)  Omega-3 .... Take Once Daily 10)  Viagra 100 Mg Tabs (Sildenafil Citrate) .... 1/2 or 1 Tab No More Often Than Once A Day 11)  Ecasa .Marland Kitchen.. 81 Mg 1x Per Day.  Allergies (verified): No Known Drug Allergies  Past History:  Past Medical History: Reviewed history from 04/07/2010 and no changes required.  Aortic insufficiency Hypertension Hypothyroidism EPISTAXIS-- f/u per ENT h/o ADENOCARCINOMA, COLON status post right hemicolectomy and  chemotherapy.  F/U with the cancer center and Dr. Ezzard Standing routinely. Had a Cscope Ezzard Standing) 2005 and 2008  INSOMNIA  ECZEMA  LEG CRAMPS  HAY FEVER   ERECTILE DYSFUNCTION  Osteopenia-- per DEXA 07-2008 (Rx fosamax)  Past Surgical History: Inguinal herniorrhaphy Right hemicolectomy with side-to-side terminal ileum to left transverse colon anastomosis.  Family History: Reviewed history from 04/07/2010 and no changes required. DM--no MI--no Colon ca--no Prostate ca--no  Social History: original from [place] Married vegetarian two children tobacco--never ETOH--no Daily Caffeine Use one per day  Review of Systems       The patient complains of back pain, cough, and sleeping problems.         All other ROS negative except as per HPI.   Vital Signs:  Patient profile:   77 year old male Height:      62 inches Weight:      131.8 pounds BMI:     24.19 Pulse rate:   68 / minute Pulse rhythm:   regular BP sitting:  138 / 64  (left arm) Cuff size:   regular  Vitals Entered By: Harlow Mares CMA Duncan Dull) (May 18, 2010 10:06 AM)  Physical Exam  General:  Well developed, well nourished, no acute distress. Eyes:  anicteric Mouth:  No deformity or lesions,  Neck:  Supple; no masses or thyromegaly. Lungs:  Clear throughout to auscultation. Heart:  Regular rate and rhythm; no murmurs, rubs,  or bruits. Abdomen:  Soft, nontender and nondistended. No masses, hepatosplenomegaly or hernias noted. Normal bowel  sounds. Extremities:  No clubbing, cyanosis, edema or deformities noted. Cervical Nodes:  No significant cervical or supraclavicular adenopathy.  Psych:  Alert and cooperative. Normal mood and affect.   Impression & Recommendations:  Problem # 1:  LUQ PAIN (ICD-789.02) Assessment New He has had for several months with period of improvement or resolution with Nexium. Given his age and the severity of the symptoms (he says intense and frequent) will investigate with an EGD. ? if Fosamax implicated, PUD, other. Risks, benefits,and indications of endoscopic procedure(s) were reviewed with the patient and all questions answered.  Orders: EGD (EGD)  Problem # 2:  SERUM ENZYME LEVELS, ABNORMAL (ICD-790.5) Assessment: New Amylase mildly elevated - lipase was ok could indicate an ulcer await EGD  Patient Instructions: 1)  You have been scheduled for an endoscopy. Please follow written prep instructions that were given to you today at your visit.  2)  Copy sent to : Dr Willow Ora 3)  The medication list was reviewed and reconciled.  All changed / newly prescribed medications were explained.  A complete medication list was provided to the patient / caregiver.

## 2010-06-23 NOTE — Procedures (Signed)
Summary: Flexible colonoscopy/Dr. Ezzard Standing   Flexible Sigmoidoscopy  Procedure date:  10/08/2006  Comments:      Location: [facility].  [name], [name] NO.:  [id number]      MEDICAL RECORD NO.:  [id number]          PATIENT TYPE:  AMB      LOCATION:  ENDO                         FACILITY:  [facility]      PHYSICIAN:  Sandria Bales. Ezzard Standing, M.D.  DATE OF BIRTH:  1928-10-11      DATE OF PROCEDURE:  10/08/2006   DATE OF DISCHARGE:                                  OPERATIVE REPORT      PREOPERATIVE DIAGNOSIS:  History of colon cancer.      POSTOPERATIVE DIAGNOSIS:  History of right colon cancer.      PROCEDURE:  Flexible colonoscopy.      SURGEON:  Sandria Bales. Ezzard Standing, M.D.      ANESTHESIA:  50 mg of Demerol, 3 mg of Versed.      INDICATIONS FOR PROCEDURE:  [name] is a 77 year old [name] who   had a right hemicolectomy January 2004 by Dr. Ovidio Kin.  He had a   T3, N1 carcinoma with 2 of 9 nodes positive.  He underwent adjuvant   chemotherapy supervised by Dr. Truett Perna and has done well since that   time.      He now comes for interval colonoscopy for followup of his colon cancer.   The indications and potential complications were explained to the   patient and the patient had some prior colonoscopies and understands the   procedure.      PROCEDURAL NOTE:  The patient was in a left lateral decubitus position.   He completed a mechanical bowel prep at home using HalfLytely.  He was   given 50 mg of Demerol, 3 mg of Versed at the initiation of the   procedure.      A flexible adult Pentax colonoscope was passed transanally without   difficulty.  The scope was advanced to 110 cm to his tranverse colon.   The ileocolonic anastomosis was visualized.  The transverse colon, right   colon, sigmoid colon and rectum were all within normal limits.  I saw no   polyps, no growths, no suspicious masses at all.  He did have some stool   stuck to the colon  wall, but I was able to irrigate these areas out   without any difficulty.      This is now his second colonoscopy since his surgery.  I would think his   next colonoscopy can be in 5 years at this time unless he should have   some interval problems or symptoms.               Sandria Bales. Ezzard Standing, M.D.   Electronically Signed         DHN/MEDQ  D:  10/08/2006  T:  10/08/2006  Job:  [id number]      cc:   Leighton Roach. Truett Perna, M.D.   Willow Ora, MD

## 2010-06-23 NOTE — Op Note (Signed)
Summary:  Laparoscopic bilateral inguinal herniorrhaphy/Dr. Ezzard Standing    NAMENAAMAN, CURRO NO.:  [id number]   MEDICAL RECORD NO.:  [id number]                   PATIENT TYPE:  OBV   LOCATION:  0478                                 [facility]AC[facility]   PHYSICIAN:  Sandria Bales. Ezzard Standing, M.D.               DATE OF BIRTH:  1928-08-26   DATE OF PROCEDURE:  08/18/2002  DATE OF DISCHARGE:                                 OPERATIVE REPORT   PREOPERATIVE DIAGNOSIS:  Bilateral inguinal hernias.   POSTOPERATIVE DIAGNOSIS:  Bilateral medium to large indirect inguinal  hernias.   PROCEDURE:  Laparoscopic bilateral inguinal herniorrhaphy with precut Atrium  mesh.   SURGEON:  Sandria Bales. Ezzard Standing, MD   FIRST ASSISTANT:  None.   ANESTHESIA:  General endotracheal.   ESTIMATED BLOOD LOSS:  Minimal.   INDICATIONS FOR PROCEDURE:  Mr. Mcnelly is a 77 year old [place] male, who had a  colon cancer resected about two months ago.  He had known bilateral inguinal  hernias.  Unfortunately, in the office one day, he came in with a hernia  incarcerated, but I was able to reduce the hernia on his left side and  despite getting chemotherapy, I thought he would be best served by  proceeding with repair of both of these hernias.  I talked with him about  both laparoscopic and open hernias,  discussed both the indications and  potential complications including but not limited to infection, bleeding,  nerve injury, and recurrent hernias.   DESCRIPTION OF OPERATION:  The patient placed in a supine position, given a  general anesthetic.  He had 1 g of Ancef at the initiation of the procedure,  a Foley catheter in place.  His lower abdomen was shave and prepped with  Betadine solution and sterilely draped.   An infraumbilical incision was made with sharp dissection carried to the  left of midline since this was the side where the hernia tried on  incarcerate.  I went through his anterior  rectus fascia, retracted his  muscle anteriorly but did not go through his posterior fascia and passed the  PDB balloon into the preperitoneal space.   I insufflated a ballon under direct laparoscopic visualization,  demonstrating the muscle anteriorly, the fat posteriorly.   I then placed two additional 5 mm trocars in both lower quadrants and  visualized his preperitoneal space.  On the right side, I was able to find  and isolate an indirect inguinal hernia which went up into the ring.  I  actually pulled the hernia down, placed an Endoloop around the hernia sac.  On the left side, I did the same thing, although this hernia sac was more  scarred in, I think in part because the incarcerating part was somewhat  larger.  Again, I pulled this down from the internal ring and put an  Endoloop around  this.   At this time, I started my inguinal hernia repairs using the precut Atrium  mesh, placed this into the preperitoneal space, tacked it medially to the  pubic symphysis inferiorly to Cooper's ligament, superiorly to transversalis  fascia.  I wrapped this mesh around the cord structures and tacked the tail  down to the inguinal floor, and then I put some tacks out laterally.   I then repeat measured on the right side, began tacking to the pubic  symphysis medially, Cooper's ligament inferiorly, transversalis fascia  superiorly.  On both sides, I avoided placing tacks lateral to the cord  structures and inferior to the ileopubic tract.  I used a total of 12 tacks  on the left side, put 15 tacks on the right side.   At this point, the mesh lay flat.  There was no bleeding of any consequence.  I removed both trocars without trouble.  I then removed the Hasson trocar,  closed the defect in the fascia with 0 Vicryl suture and closed the skin at  each site with a 5-0 Monocryl followed by Vicryl, painted the wounds with  tincture of Benzoin and Steri-Strips.   The patient tolerated the  procedure well and was transported to the recovery  room in good condition.  Because he has bilateral inguinal hernias and was a  recent colon cancer patient, a little trouble with the [place] language, I  will plan to keep him overnight for observation with plans to discharge  tomorrow.                                               Sandria Bales. Ezzard Standing, M.D.    DHN/MEDQ  D:  08/18/2002  T:  08/18/2002  Job:  [id number]   cc:   Coty P. Garlock, M.D.  [facility]   Leighton Roach. Truett Perna, M.D.  [address] [facility]  [place]  [place]  [address]  Fax: [phone]

## 2010-06-23 NOTE — Progress Notes (Signed)
Summary: Diffrent Rx  Phone Note Call from Patient Call back at Home Phone [phone]   Call For: Dr Leone Payor Summary of Call: Patient calling to advice [place] [facility] [address] [phone]  and since it has been two days and no medicine is ready for him to pick up we must obviously have the wrong pharmacy. Initial call taken by: Leanor Kail [facility],  May 25, 2010 1:42 PM    New/Updated Medications: BIAXIN 500 MG TABS (CLARITHROMYCIN) one by mouth two times a day AMOXICILLIN-POT CLAVULANATE 1000-62.5 MG XR12H-TAB (AMOXICILLIN-POT CLAVULANATE) one tablet by mouth two times a day Prescriptions: AMOXICILLIN-POT CLAVULANATE 1000-62.5 MG XR12H-TAB (AMOXICILLIN-POT CLAVULANATE) one tablet by mouth two times a day  #20 x 0   Entered by:   Ok Anis CMA   Authorized by:   Iva Boop MD, [facility]   Signed by:   Ok Anis CMA on 05/25/2010   Method used:   [facility]        [facility] [phone]* (retail)       [address]       [place], [place]  [address]       Ph: [phone]       Fax: [phone]   RxID:   [id number] BIAXIN 500 MG TABS (CLARITHROMYCIN) one by mouth two times a day  #20 x 0   Entered by:   Ok Anis CMA   Authorized by:   Iva Boop MD, [facility]   Signed by:   Ok Anis CMA on 05/25/2010   Method used:   Electronically to        [facility] [phone]* (retail)       [address]       [place], [place]  [address]       Ph: [phone]       Fax: [phone]   RxID:   [id number] NEXIUM 40 MG  CPDR (ESOMEPRAZOLE MAGNESIUM) take one by mouth once daily  #30 x 3   Entered by:   Ok Anis CMA   Authorized by:   Iva Boop MD, [facility]   Signed by:   Ok Anis CMA on 05/25/2010   Method used:   Electronically to        [facility] [phone]* (retail)       [address]       [place], [place]  [address]       Ph: [phone]       Fax: [phone]   RxID:   [id number]

## 2010-06-23 NOTE — Discharge Summary (Signed)
NAME:  [name], [name]                              ACCOUNT NO.:  [id number]   MEDICAL RECORD NO.:  [id number]                   PATIENT TYPE:  INP   LOCATION:  0354                                 FACILITY:  [facility]   PHYSICIAN:  Sandria Bales. Ezzard Standing, M.D.               DATE OF BIRTH:  [birth year]   DATE OF ADMISSION:  06/05/2002  DATE OF DISCHARGE:  06/11/2002                                 DISCHARGE SUMMARY   DISCHARGE DIAGNOSES:  1. 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  2. Bilateral inguinal hernias.  3. Hypothyroidism corrected on Synthroid.  4. Hypertension on Diovan.   OPERATION PERFORMED:  Right hemicolectomy on June 05, 2002.   HISTORY OF PRESENT ILLNESS:  Mr. Sadowsky is a pleasant 77 year old [place] male  who has bilateral inguinal hernias. He is a patient of Dr. Paulino Door. On rectal  exam, he had guaiac positive stools. I did a colonoscopy and identified a  tumor of his right transverse colon. He completed a workup including a CT  scan of his chest which showed a nodular infiltrate in the posterior aspect  of his lingula. CT of his abdomen which showed a lucency with an L3 and CT  of his pelvis which showed a left inguinal hernia but all of these were felt  to be probably not related to his tumor.   He completed an antibiotic and mechanical bowel prep and then came to the  hospital on the 15th of January 2004 where he underwent a right  hemicolectomy. At the time of laparotomy, I saw no evidence of metastatic  disease.   Postoperatively, he did very well. His first postoperative day his  hemoglobin was 11, hematocrit 35, white blood count 13,200. His sodium was  131, potassium 3.8, chloride 104, CO2 of 24, glucose of 137 and BUN of 6. He  was kept n.p.o. for about 4 days and then started on clear liquids. He had  been rapidly advanced to a full liquid diet. We have not gone any further  but he is actually  vegetarian and will let him kind of advance his own diets  at home.   His final pathology did show an invasive adenocarcinoma which was a T3 tumor  with 2/9 nodes N1. I made copies of his path report that he could present to  his son.   DISCHARGE INSTRUCTIONS:  1. Vicodin for pain.  2. He should take an over the counter vitamin.  3. He is to do no heavy lifting or driving for about a week.  4. His diet has no restrictions.  5. He can shower and will take his staples out on admission.  6. His follow up is to see me in 7-10 days.  Sandria Bales. Ezzard Standing, M.D.    DHN/MEDQ  D:  06/11/2002  T:  06/11/2002  Job:  [id number]   cc:   Dr. A_______________  Audley Hose, M.D.  [facility]  [facility] [place], [place] [address]

## 2010-06-23 NOTE — Miscellaneous (Signed)
Summary: Samples Of Nexium  Clinical Lists Changes  Medications: Changed medication from NEXIUM 40 MG  CPDR (ESOMEPRAZOLE MAGNESIUM) take one by mouth as needed to NEXIUM 40 MG  CPDR (ESOMEPRAZOLE MAGNESIUM) take one by mouth once daily

## 2010-06-23 NOTE — Op Note (Signed)
Summary: Removal of Port-A-Cath/Dr. Ezzard Standing  [name], LEGE NO.:  [id number]   MEDICAL RECORD NO.:  [id number]                   PATIENT TYPE:  AMB   LOCATION:  DSC                                  FACILITY:  [facility]   PHYSICIAN:  Sandria Bales. Ezzard Standing, M.D.               DATE OF BIRTH:  07-May-1929   DATE OF PROCEDURE:  12/22/2003  DATE OF DISCHARGE:                                 OPERATIVE REPORT   PREOPERATIVE DIAGNOSIS:  History of colon cancer with a Port-A-Cath  placement.   POSTOPERATIVE DIAGNOSIS:  History of colon cancer with a Port-A-Cath  placement.   PROCEDURE:  Removal of Port-A-Cath.   SURGEON:  Sandria Bales. Ezzard Standing, M.D.   ANESTHESIA:  Xylocaine 1%, 8 mL.   COMPLICATIONS:  None at the end of the procedure.   INDICATIONS FOR PROCEDURE:  The patient is a 77 year old [name] who  had a right colon cancer resected by me in January  2004.  He has undergone  chemotherapy by Dr. Jillyn Hidden B. Sherrill and has done well with that.  He now  comes for a Port-A-Cath removal.   DESCRIPTION OF PROCEDURE:  The patient is placed in a supine position.  His  left shoulder is prepped with Betadine solution and sterilely draped.  I  used about 8 mL of 1% Xylocaine to infiltrate the skin.  I then cut down on  the Port-A-Cath and removed it intact.  The patient did not want to take  this home, so we threw it in the trash.  The wound was then closed with  running #5-0 Vicryl suture and painted with tincture of Benzoin and Steri-  Strips.   DISPOSITION:  I will see him back in two weeks for a wound check.                                               Sandria Bales. Ezzard Standing, M.D.    DHN/MEDQ  D:  12/22/2003  T:  12/22/2003  Job:  [id number]   cc:   Jillyn Hidden B. Truett Perna, M.D.  [address] [facility]  [place]  [place] [address]  Fax: [phone]   Gates Rigg, M.D.

## 2010-06-23 NOTE — Op Note (Signed)
Summary:  Left subclavian ex-port, (Bard)/Dr. Ezzard Standing    NAMEREA, RESER NO.:  [id number]   MEDICAL RECORD NO.:  [id number]                   PATIENT TYPE:  AMB   LOCATION:  DSC                                  FACILITY:  [facility]   PHYSICIAN:  Sandria Bales. Ezzard Standing, M.D.               DATE OF BIRTH:  10-Dec-1928   DATE OF PROCEDURE:  07/21/2002  DATE OF DISCHARGE:                                 OPERATIVE REPORT   PREOPERATIVE DIAGNOSIS:  Colon cancer receiving chemotherapy.   POSTOPERATIVE DIAGNOSIS:  Colon cancer receiving chemotherapy, need of IV  access.   OPERATION/PROCEDURE:  Left subclavian ex-port, (Bard).   ASSISTANT:  None.   ANESTHESIA:  MAC with 15 cc of 1% Xylocaine.   COMPLICATIONS:  None.   INDICATIONS FOR PROCEDURE:  The patient is a 76 year old white male who has  T3 N1 colon carcinoma receiving chemotherapy by Dr. Mancel Bale.  He now  comes for placement of a Port-A-Cath.  The indications, potential  complications were explained to the patient.  The potential complications  including but not limited to bleeding, infection, pneumothorax and venous  thrombosis.   DESCRIPTION OF PROCEDURE:  The patient was placed in a supine position with  a roll under his back.  His upper chest was prepped and draped with Betadine  solution and given 1 g of Ancef initially at the procedure.  I used the Bard  ex-port for placement.  I accessed the left subclavian vein with the 14-  gauge needle, threaded the guide wire into the superior vena cava.  I  checked this with fluoroscopy.   I then developed a pocket in the upper inner aspect of his left chest,  threaded the silastic tubing from the pocket to the left subclavian sixth  site and then inserted into the subclavian vein using an 8.5 French  introducer and checked this with fluoroscopy, placing the position of the  tip at the junction of the superior vena cava, right atrium.   I then  attached the reservoir, flushed the entire unit with dilute and  concentrated heparin and the concentrated heparin being 100 units per cc.  I  then checked again the position of the reservoir, the silastic tubing.  All  were in good position.  The reservoir was sewn in place with a 3-0 Vicryl  suture.  The subcutaneous tissues were closed with 3-0 Vicryl suture and the  skin was closed with a 5-0 Vicryl suture, painted with Benzoin and Steri-  Strips.   The patient tolerated the procedure well.  Chest x-ray is pending at the  time of this dictation.  He is actually scheduled for his chemotherapy  through Dr. Mancel Bale.  Sandria Bales. Ezzard Standing, M.D.    DHN/MEDQ  D:  07/21/2002  T:  07/21/2002  Job:  [id number]   cc:   Riley Nearing, M.D.  [place], [place]   Jillyn Hidden B. Truett Perna, M.D.  [address] [facility]  [place]  [place]  [address]  Fax: [phone]   Toni Amend, M.D.  [facility], [place], [place]

## 2010-06-23 NOTE — Progress Notes (Signed)
Summary: RX for Biaxin   Phone Note Other Incoming   [facility]  Summary of Call: Per Morrie Sheldon at [facility] they are out of Biaxin and Clarithromycin but  they do have extended release Biaxin.  Please respond  Initial call taken by: Ok Anis CMA,  May 25, 2010 3:26 PM  Follow-up for Phone Call        Due to cost and that extended release is not part of the standard tx regimen I recommend he wait til they get the standard clarithromycin or we try another pharmacy the treatment of H. pylori is not urgent Follow-up by: Iva Boop MD, Clementeen Graham,  May 26, 2010 9:26 AM  Additional Follow-up for Phone Call Additional follow up Details #1::        [facility] and per Tresa Endo the pharmacist they do have Biaxin and she did take RX over the phone.  Called the patient, no answer left message for him to call me back  Additional Follow-up by: Ok Anis CMA,  May 26, 2010 11:28 AM    New/Updated Medications: BIAXIN 500 MG TABS (CLARITHROMYCIN) one by mouth two times a day

## 2010-06-23 NOTE — Letter (Signed)
Summary: EGD Instructions  [facility]  [address] [place], [place] [address]   Phone: [phone]  Fax: [phone]       [name]    1928/08/01    MRN: [id number]       Procedure Day /Date: 05/19/10 Thursday     Arrival Time: 9:00 am     Procedure Time: 10:00 am     Location of Procedure:                    _ x _ [facility] (4th Floor)  PREPARATION FOR ENDOSCOPY   On 05/19/10 THE DAY OF THE PROCEDURE:  1.   No solid foods, milk or milk products are allowed after midnight the night before your procedure.  2.   Do not drink anything colored red or purple.  Avoid juices with pulp.  No orange juice.  3.  You may drink clear liquids until 8:00 am, which is 2 hours before your procedure.                                                                                                CLEAR LIQUIDS INCLUDE: Water Jello Ice Popsicles Tea (sugar ok, no milk/cream) Powdered fruit flavored drinks Coffee (sugar ok, no milk/cream) Gatorade Juice: apple, white grape, white cranberry  Lemonade Clear bullion, consomm, broth Carbonated beverages (any kind) Strained chicken noodle soup Hard Candy   MEDICATION INSTRUCTIONS  Unless otherwise instructed, you should take regular prescription medications with a small sip of water as early as possible the morning of your procedure.  ]                  OTHER INSTRUCTIONS  You will need a responsible adult at least 75 years of age to accompany you and drive you home.   This person must remain in the waiting room during your procedure.  Wear loose fitting clothing that is easily removed.  Leave jewelry and other valuables at home.  However, you may wish to bring a book to read or an iPod/MP3 player to listen to music as you wait for your procedure to start.  Remove all body piercing jewelry and leave at home.  Total time from sign-in until discharge is approximately 2-3 hours.  You  should go home directly after your procedure and rest.  You can resume normal activities the day after your procedure.  The day of your procedure you should not:   Drive   Make legal decisions   Operate machinery   Drink alcohol   Return to work  You will receive specific instructions about eating, activities and medications before you leave.    The above instructions have been reviewed and explained to me by   Lamona Curl CMA Duncan Dull)  May 18, 2010 10:27 AM    I fully understand and can verbalize these instructions _____________________________ Date 05/18/10

## 2010-06-23 NOTE — Progress Notes (Signed)
Summary: ulcer bx results and plans  Phone Note Outgoing Call   Summary of Call: Let him know that 1) Ulcer is benign 2) Has H. pylori infection 3) needs amoxicillin 100 mg two times a day and Biaxin 500 mg two times a day x 10 days along with Nexium 40 mg once daily 4) Nexium is costly for him and he is on samples, so Rx with generic PPI (omeprazole 40 or lansoprazole 30) every day when he is out 5) F/u me in 6-8 weeks 6) restart ASA Feb 1 7) Stay off Fosamax  if this is too difficult to handle over phone due to communication barrier then do an RN visit or extender visit to carry this out week of 1/3 Iva Boop MD, [facility]  May 20, 2010 6:36 PM   Follow-up for Phone Call        REV scheduled for 07/12/10 8:30.  Patient verblaized understanding of all instructions.  He asked that I mail him a copy of this and his pathology report.  I have done so. Follow-up by: Darcey Nora RN, CGRN,  May 24, 2010 9:27 AM    New/Updated Medications: * ECASA 81 mg 1x per day. AMOXICILLIN 500 MG CAPS (AMOXICILLIN) 2 by mouth two times a day for 10 days CLARITHROMYCIN 500 MG TABS (CLARITHROMYCIN) 1 by mouth two times a day for 10 days OMEPRAZOLE 40 MG CPDR (OMEPRAZOLE) 1 by mouth once daily .  Start once the Nexium samples are gone Prescriptions: OMEPRAZOLE 40 MG CPDR (OMEPRAZOLE) 1 by mouth once daily .  Start once the Nexium samples are gone  #30 x 3   Entered by:   Darcey Nora RN, CGRN   Authorized by:   Iva Boop MD, [facility]   Signed by:   Darcey Nora RN, CGRN on 05/24/2010   Method used:   Electronically to        [facility] [phone]* (retail)       [address] [place], [place]  [address]       Ph: [phone]       Fax: [phone]   RxID:   [id number] CLARITHROMYCIN 500 MG TABS (CLARITHROMYCIN) 1 by mouth two times a day for 10 days  #20 x 0   Entered by:   Darcey Nora RN, CGRN   Authorized by:   Iva Boop MD, [facility]   Signed by:   Darcey Nora RN, CGRN  on 05/24/2010   Method used:   Electronically to        [facility] [phone]* (retail)       [address] [place], [place]  [address]       Ph: [phone]       Fax: [phone]   RxID:   [id number] AMOXICILLIN 500 MG CAPS (AMOXICILLIN) 2 by mouth two times a day for 10 days  #40 x 0   Entered by:   Darcey Nora RN, CGRN   Authorized by:   Iva Boop MD, [facility]   Signed by:   Darcey Nora RN, CGRN on 05/24/2010   Method used:   Electronically to        [facility] # [phone]* (retail)       [address] [place], [place]  [address]       Ph: [phone]  Fax: [phone]   RxID:   [id number]

## 2010-06-23 NOTE — Progress Notes (Signed)
  Phone Note Other Incoming   Caller: pharmacy Summary of Call: there is a national back order for biaxin.  I asked them to remove the amoxicillin order and will call in pylera pack. Initial call taken by: Rachael Fee MD,  May 24, 2010 5:57 PM    New/Updated Medications: PYLERA 140-125-125 MG  CAPS (BIS SUBCIT-METRONID-TETRACYC) As directed Prescriptions: PYLERA 140-125-125 MG  CAPS (BIS SUBCIT-METRONID-TETRACYC) As directed  #1 pack x 0   Entered and Authorized by:   Rachael Fee MD   Signed by:   Rachael Fee MD on 05/24/2010   Method used:   Electronically to        [facility] [phone]* (retail)       [address] [place], [place]  [address]       Ph: [phone]       Fax: [phone]   RxID:   [id number]

## 2010-06-23 NOTE — Op Note (Signed)
Summary: Right hemicolectomy & Pathology/Dr. Ezzard Standing    NAMETHURMAN, [name] NO.:  [id number]   MEDICAL RECORD NO.:  [id number]                   PATIENT TYPE:  INP   LOCATION:  0002                                 FACILITY:  [facility]   PHYSICIAN:  Sandria Bales. Ezzard Standing, M.D.               DATE OF BIRTH:  Jun 05, 1928   DATE OF PROCEDURE:  06/05/2002  DATE OF DISCHARGE:                                 OPERATIVE REPORT   SYSTEM NUMBER:  [id number]   PREOPERATIVE DIAGNOSIS:  Right transverse colon carcinoma.   POSTOPERATIVE DIAGNOSIS:  Colon cancer, located right at the hepatic  flexure/right transverse colon.  Approximately 3.5 cm in size.   DESCRIPTION OF PROCEDURE:  Right hemicolectomy with side-to-side terminal  ileum to left transverse colon anastomosis.   SURGEON:  Sandria Bales. Ezzard Standing, M.D.   ASSISTANT:  Rose Phi. Maple Hudson, M.D.   ANESTHESIA:  General endotracheal.   ESTIMATED BLOOD LOSS:  Minimal.   INDICATIONS FOR PROCEDURE:  The patient is a 77 year old male who I  originally saw in December for bilateral inguinal hernias.  He had guaiac-  positive stool on my examination, and therefore I did a colonoscopy on him.  This revealed a tumor of his right transverse colon.  A CT scan showed no  obvious metastatic disease, though it does suggest a change in the lingula  of his left lung.  His CEA is 1.1.  I discussed with him proceeding with  surgery.  I discussed the indications, potential complications included, but  not limited to:  infection, bleeding, bowel leak.  The patient now comes in  after completing a mechanical and antibiotic bowl prep for colon resection.   DESCRIPTION OF PROCEDURE:  He is placed in the supine position.  Anesthesia  is supervised by Dr. Okey Dupre.  His abdomen was prepped with Betadine solution  and sterilely draped.  He had an orogastric tube in place and a Foley  catheter in place.  PA stockings were in place.  He is given 1 gm of  Cefotetan at the initiation of the procedure.   A midline incision was made with sharp dissection and carried down to  abdominal cavity.  The abdomen was then explored.  His right and left lobes  of liver were palpated and unremarkable.  His gallbladder was palpated, had  no palpable stones.  The stomach was unremarkable with an NG tube in place.  His retroperitoneum was unremarkable.  His small bowel was run from the  ligament of Treitz to the terminal ileum.  He did have some scarring down  toward the end of the terminal ileum, but this appeared to be benign.  He  has probably had this long term and certainly we saw no evidence of  metastatic disease; saw no evidence of Meckel's diverticula.  His tumor was  palpable right beyond the hepatic flexure  in his right transverse colon; it  did not grossly go through the wall of the bowel.  I felt no  lymphadenopathy; he had no retroperitoneal tumors or masses.  At this time I  did dissection of right colon, first identifying about 5-8 cm beyond the  tumor in the right transverse colon (about mid transverse colon).  Divided  this with a GIA-75 stapler.  I then went into the terminal ileum and divided  this with a GIA-75 stapler.  I entered the mesentery down.  I found the main  branch of the middle colic, which fed this right colon tumor, and took this  down to the base of the mesentery.  I identified the duodenum and reflected  this laterally and posteriorly.  I identified the ureter, and this was kept  more posterior to the dissection.  The mesentery was divided and ligated  using 2-0 silk sutures.  After the specimen was completely removed, I opened  it on the side table just to evaluate and make sure I had adequate margins  (which it appeared that I did).  This specimen was sent for permanent  pathology.   I then closed the mesentery with a running 2-0 chromic suture.  I then lined  up the two ends of stapled bowel and did a stapled  side-to-side jejunal to  transverse colon anastomosis, with a GIA-75 stapler.  I inspected the staple  line; there was minimal bleeding.  I did suture one spot.  I then closed the  enterotomy with interrupted 3-0 silk sutures.   I then laid the anastomosis to the right side of the bowel.  I then  irrigated out the abdomen.  I found no evidence of any bleeding at any site  along where we had taken the bowel down.  I used about 3 L of irrigation.   I then closed the abdomen with two running #1 PDS sutures, stapled the skin.  Because of his right colon anastomosis and it went fairly smoothly, I had NG  tube removed at the end of the procedure.  Will remove the NG tube out and  will leave the Foley catheter in place for at least overnight.   Final pathology is pending at time of this dictation.  Sponge and needle  count were correct.                                               Sandria Bales. Ezzard Standing, M.D.    DHN/MEDQ  D:  06/05/2002  T:  06/05/2002  Job:  [id number]   cc:   Dr. Riley Nearing, M.D.  [place], [place]   Dr. Kathleene Hazel, M.D.  [facility]  [place], [place]    [facility] - STATUS: Final             By: Threasa Beards  ,        Perform Date: [id number] 00:00  Ordered By: Ernst Spell,          Ordered Date:  Facility: [facility]                              Department: CPATH  Service Report Text  [facility]   [address] [place], [place] [address]   [phone]    REPORT OF SURGICAL PATHOLOGY    Case #: [id number]   Patient Name: [name], [name]   PID: [id number]   Pathologist: Havery Moros, MD   DOB/Age [birth year] (Age: 39) Gender: M   Date Taken: 06/05/2002   Date Received: 06/05/2002    FINAL DIAGNOSIS    ***MICROSCOPIC EXAMINATION AND DIAGNOSIS***    RIGHT COLON AND A PORTION OF TERMINAL ILEUM, RESECTION:   - INVASIVE COLONIC ADENOCARCINOMA WITH MUCINOUS FEATURES.   - NEGATIVE SURGICAL MARGINS OF RESECTION   -  METASTATIC CARCINOMA IN TWO OF NINE PERICOLONIC LYMPH   NODES.   - SMALL ADENOMATOUS POLYPS   - BENIGN APPENDIX.    COMMENT   ONCOLOGY TABLE- COLON AND RECTUM    1. Maximum tumor size (cm): 4cm   2. Histology: Invasive adenocarcinoma with mucinous   features.   3. Grade: Moderately differentiated.   4. Margins: proximal- negative; distal- negative;   radial- negative   Distance of invasive carcinoma from nearest margin: 4cm   (distal)   5. Perforation of visceral peritoneum: No   6. Depth of invasion: The tumor invades through the   muscularis propria into the pericolonic fat.   7. Vascular/lymphatic invasion: Yes   8. Lymph nodes: # examined - 9; # positive- 2   9. TNM code: pT3, pN1, pMX    gdt   Date Reported: 06/09/2002 Havery Moros, MD   *** Electronically Signed Out By BNS ***    Clinical information   Colon cancer. (ac)    specimen(s) obtained   Colon, segmental resection, right    Gross Description   Specimen: Right colon, received in formalin.   Length: Specimen includes 4 cm of terminal ileum and 25 cm of   colon.   Serosa: Smooth tan-pink.   Tumor location: The tumor is located along the mesenteric border   in the ascending colon.   Tumor size (cm): The tumor consists of a 4 x 3.8 x 1.7 cm   sessile, polypoid tan-red mass which is focally ulcerated.   % Circumference involved by tumor: 70%.   Depth of invasion: The tumor focally extends into the underlying   fat to a maximum depth of approximately 0.2 cm.   Margins: proximal 18 cm; distal 4 cm; radial 5 cm.   Other mucosal lesions: The uninvolved mucosa is glistening and   tan. Proximal to the tumor there are three sessile tan mucosal   polyps measuring 0.5 to 1.2 cm in greatest dimension. The   appendix is present and measures 4.5 cm in length x 0.7 cm in   diameter.   Lymph nodes: There are nine firm tan-red ovoid nodules   tentatively identified as lymph nodes measuring 0.3 to 0.8 cm in   greatest  dimension.   Block summary: Eight blocks submitted   A = proximal margin   B = distal margin   C, D, E = tumor   F = separate polyps   G, H = lymph nodes   I = appendix (GP:caf 06/05/02)    cf/

## 2010-06-23 NOTE — Procedures (Signed)
Summary: Upper Endoscopy  Patient: [name] Note: All result statuses are Final unless otherwise noted.  Tests: (1) Upper Endoscopy (EGD)   EGD Upper Endoscopy       DONE     [facility]     [address]     [place], [place]  [address]           ENDOSCOPY PROCEDURE REPORT           PATIENT:  [name], [name]  MR#:  [id number]     BIRTHDATE:  Feb 14, 1929, 81 yrs. old  GENDER:  male           ENDOSCOPIST:  Iva Boop, MD, [facility]     Referred by:  [name], M.D.           PROCEDURE DATE:  05/19/2010     PROCEDURE:  EGD with biopsy, [id number]     ASA CLASS:  Class II     INDICATIONS:  abdominal pain, left upper quadrant           MEDICATIONS:   Fentanyl 50 mcg IV, Versed 5 mg     TOPICAL ANESTHETIC:  Exactacain Spray           DESCRIPTION OF PROCEDURE:   After the risks benefits and     alternatives of the procedure were thoroughly explained, informed     consent was obtained.  [facility] GIF-H180 [id number] endoscope was     introduced through the mouth and advanced to the second portion of     the duodenum, without limitations.  The instrument was slowly     withdrawn as the mucosa was fully examined.     <<PROCEDUREIMAGES>>           An ulcer was found in the antrum. Clean-based, 1 cm ulcer in     pre-pyloric antrum. Surrounded by mild erythema and edema and a     few erosions. Multiple biopsies were obtained and sent to     pathology.  A hiatal hernia was found. It was 10 cm in size. 35-45     cm.    Retroflexed views revealed a hiatal hernia.    The scope     was then withdrawn from the patient and the procedure completed.           COMPLICATIONS:  None           ENDOSCOPIC IMPRESSION:     1) Ulcer in the antrum - 1 cm and clean-based. Surrounded by     some edema, erythema and a few erosions.     2) 10 cm hiatal hernia     RECOMMENDATIONS:     1) take Nexium 40 mg daily - samples provided through office     2) Stop Fosamax and hold aspirin at this time.     3) Await  pathology for further plans.           REPEAT EXAM:  In for EGD, pending biopsy results.           Iva Boop, MD, [facility]           CC:  Christian Ora, MD     Christian Kin, MD     The Patient           n.     eSIGNED:   Iva Boop at 05/19/2010 11:10 AM           Wylene Simmer, [id number]  Note: An  exclamation mark (!) indicates a result that was not dispersed into the flowsheet. Document Creation Date: 05/19/2010 11:11 AM _______________________________________________________________________  (1) Order result status: Final Collection or observation date-time: 05/19/2010 11:01 Requested date-time:  Receipt date-time:  Reported date-time:  Referring Physician:   Ordering Physician: Stan Head [phone]) Specimen Source:  Source: Launa Grill Order Number: [id number] Lab site:   Appended Document: Upper Endoscopy patient called and would like [place] to dc nexium and make sure omeprazole is called in. I have done this and advised his Biaxin is at [place].   Clinical Lists Changes  Medications: Removed medication of NEXIUM 40 MG  CPDR (ESOMEPRAZOLE MAGNESIUM) take one by mouth once daily

## 2010-06-23 NOTE — Progress Notes (Signed)
Summary: Medication  Phone Note From Pharmacy   Caller: Matt @ [facility] [phone] Call For: Dr. Christella Hartigan  Summary of Call: Pylera is not covered by Ins. Would like to discuss some options Initial call taken by: Karna Christmas,  May 25, 2010 10:07 AM  Follow-up for Phone Call        Dr Christella Hartigan what else would you like to send? Chales Abrahams CMA Duncan Dull)  May 25, 2010 10:12 AM   Additional Follow-up for Phone Call Additional follow up Details #1::        I will forward to Dr. Leone Payor (pylera not covered, clarithromycin not available) Additional Follow-up by: Rachael Fee MD,  May 25, 2010 11:06 AM    New/Updated Medications: AMOXICILLIN 500 MG TABS (AMOXICILLIN) two tablets by mouth two times a day for 10 days Prescriptions: AMOXICILLIN 500 MG TABS (AMOXICILLIN) two tablets by mouth two times a day for 10 days  #40 x 0   Entered by:   Ok Anis CMA   Authorized by:   Iva Boop MD, [facility]   Signed by:   Ok Anis CMA on 05/25/2010   Method used:   [facility]ron[facility] to        [facility] [phone]* (retail)       [address]       [place], [place]  [address]       Ph: [phone]       Fax: [phone]   RxID:   [id number]

## 2010-06-23 NOTE — Procedures (Signed)
Summary: EGD and flexible colonoscopy/Dr. Ezzard Standing   Colonoscopy  Procedure date:  10/20/2001  Findings:      Location:  [facility].     NAMEJAVORIS, [name] NO.:  [id number]   MEDICAL RECORD NO.:  [id number]                   PATIENT TYPE:  AMB   LOCATION:  ENDO                                 FACILITY:  [facility]   PHYSICIAN:  Sandria Bales. Ezzard Standing, M.D.               DATE OF BIRTH:  09-May-1929   DATE OF PROCEDURE:  10/20/2001  DATE OF DISCHARGE:                                 OPERATIVE REPORT   PREOPERATIVE DIAGNOSIS:  Guaiac positive stools and change in bowel habits.   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   PROCEDURE:  Upper esophagogastroduodenoscopy and flexible colonoscopy.   SURGEON:  Sandria Bales. Ezzard Standing, M.D.   ANESTHESIA:  50 mg Demerol, 3 mg Versed.   COMPLICATIONS:  None.   INDICATIONS FOR PROCEDURE:  The patient is a 77 year old [name] who  is a patient of Dr. Chana Bode who came to me for bilateral inguinal hernias.  On my physical examination he had grossly Guaiac positive stool. I suggested  the patient have an upper and lower endoscopy to evaluate the cause of this.  He has completed a GoLYTELY bowel prep at home and now comes for attempted  upper endoscopy and colonoscopy.   DESCRIPTION OF PROCEDURE:  He is on nasal oxygen with an IV in his right  wrist. He is on telemetry, pulse oxymetry and has a blood pressure cuff on.  He is given 50 mg of Demerol and 3 mg of Versed at the initiation of the  procedure and the back of his throat is anesthetized with Cetacaine.   I did the upper endoscopy first, passing an Olympus upper endoscope into his  stomach without difficulty. I cannulated the second and third portions of  the duodenum, identified the pylorus, identified the stomach and  retroflexing I identified the cardia. His esophagogastric junction was at  about 36  cm from his incisors. His esophagus was normal and his upper  endoscopy examination was entirely normal with no evidence of ulcer, lesion  or mass.   We then turned the bed around. I then started the colonoscopy with the  flexible Olympus colonoscope. This was passed around to the cecum.   The ileocecal valve was identified as was the cecum. The patient had 1 or 2  polyps in his proximal right colon, however, the most remarkable lesion was  right at the hepatic flexure, probably a little bit into the transverse  colon area of the hepatic flexure. The patient had a large irregular lesion  consistent with a primary colon carcinoma that occupied 30% to 40% of the  lumina of the bowel. Because it was this was obviously an abnormal lesion  that had to be  resected, I did not biopsy it.   The remainder of his transverse colon, left colon and sigmoid colon were  unremarkable except for some scattered diverticula of the sigmoid colon. The  scope was withdrawn into his rectum. The scope was retroflexed within the  rectum and unremarkable. There was no evidence of any hemorrhoids or rectal  mass.   The patient tolerated the procedure well. His wife was at his bedside at the  procedure. I discussed with him the findings of my colonoscopy. I also drew  a diagram of the location of the tumor and gave them photographs of the  tumor.   I also spoke to his son, [name], who is an Geophysicist/field seismologist  professor  of  hematology/oncology at [employer] about the findings, and my suggesting for  further diagnostic workup and surgery.  Dr. Anselm Jungling is going to talk with his  father, [name], whether he wants to have this done in [place] or  wants to travel to [place] for further evaluation. I told him I would be  happy to help in either way.                                               Sandria Bales. Ezzard Standing, M.D.    DHN/MEDQ  D:  05/27/2002  T:  05/27/2002  Job:  [id number]   cc:   Dr. Estill Batten, M.D.  [facility] [address] [place], [place] [address]

## 2010-06-23 NOTE — Procedures (Signed)
Summary: Colonoscopy/Dr. Ezzard Standing   Colonoscopy  Procedure date:  07/17/2003  Findings:      Location:  [facility].   NAMEJHOVANNY, [name] NO.:  [id number]   MEDICAL RECORD NO.:  [id number]                   PATIENT TYPE:  AMB   LOCATION:  ENDO                                 FACILITY:  [facility]   PHYSICIAN:  Sandria Bales. Ezzard Standing, M.D.               DATE OF BIRTH:  01-29-1929   DATE OF PROCEDURE:  07/17/2003  DATE OF DISCHARGE:                                 OPERATIVE REPORT   PREOPERATIVE DIAGNOSES:  History of 3B carcinoma of the right colon.   POSTOPERATIVE DIAGNOSES:  Normal anastomosis with rare sigmoid colon  diverticula.   PROCEDURE:  Colonoscopy.   SURGEON:  Sandria Bales. Ezzard Standing, M.D.   ANESTHESIA:  50 mg Demerol, 3 mg Versed.   COMPLICATIONS:  None.   INDICATIONS FOR PROCEDURE:  [name] is a 77 year old male who had a right  hemicolectomy in January of 2004.  He had a T3 carcinoma with 2 of 9 nodes  positive.  He underwent chemotherapy by Dr. Mancel Bale which he has done  very well with.  He had a recent CT scan by Dr. Truett Perna dated May 29, 2003 which shows no evidence of residual disease. He now comes for followup  colonoscopy.  The patient completed a bowel prep at home. He had a pulse  oximeter and EKG, blood pressure cuff and was placed on nasal O2 and had his  Port-A-Cath accessed.   He was given 50 mg Demerol and 3 mg of Versed and a flexible Olympus  colonoscope was passed around to his right transverse colon.  The  anastomosis was visualized, there was no polyp or mucosal lesion of the  transverse colon or left colon.  He did have some scattered sigmoid  diverticula but had no mucosal lesion and within the rectum, there was no  mucosal lesion, the scope was retroflexed.   This was felt to be a normal exam except for the few sigmoid diverticula.  His next colonoscopy should be probably in three years unless he  should have  any symptoms or other intervening problems.                                               Sandria Bales. Ezzard Standing, M.D.    DHN/MEDQ  D:  07/17/2003  T:  07/17/2003  Job:  [id number]   cc:   Dr. Salli Quarry B. Truett Perna, M.D.  [address]  [place]  [place] [address]  Fax: [phone]   Dr. Minette Headland [employer]

## 2010-07-12 ENCOUNTER — Encounter (INDEPENDENT_AMBULATORY_CARE_PROVIDER_SITE_OTHER): Payer: Self-pay | Admitting: *Deleted

## 2010-07-12 ENCOUNTER — Encounter: Payer: Self-pay | Admitting: Internal Medicine

## 2010-07-12 ENCOUNTER — Ambulatory Visit (INDEPENDENT_AMBULATORY_CARE_PROVIDER_SITE_OTHER): Payer: Medicare Other | Admitting: Internal Medicine

## 2010-07-12 DIAGNOSIS — K279 Peptic ulcer, site unspecified, unspecified as acute or chronic, without hemorrhage or perforation: Secondary | ICD-10-CM

## 2010-07-12 DIAGNOSIS — K253 Acute gastric ulcer without hemorrhage or perforation: Secondary | ICD-10-CM | POA: Insufficient documentation

## 2010-07-19 NOTE — Letter (Signed)
Summary: EGD Instructions  [facility]  [address] [place], [place] [address]   Phone: [phone]  Fax: [phone]       [name]    10-30-1928    MRN: [id number]       Procedure Day /Date:Thurs 08/11/10     Arrival Time:130 pm     Procedure Time: 230 pm    Location of Procedure:                    [facility] (4th Floor)  PREPARATION FOR ENDOSCOPY   On 08/11/10  THE DAY OF THE PROCEDURE:  1.   No solid foods, milk or milk products are allowed after midnight the night before your procedure.  2.   Do not drink anything colored red or purple.  Avoid juices with pulp.  No orange juice.  3.  You may drink clear liquids until 1230 pm, which is 2 hours before your procedure.                                                                                                CLEAR LIQUIDS INCLUDE: Water Jello Ice Popsicles Tea (sugar ok, no milk/cream) Powdered fruit flavored drinks Coffee (sugar ok, no milk/cream) Gatorade Juice: apple, white grape, white cranberry  Lemonade Clear bullion, consomm, broth Carbonated beverages (any kind) Strained chicken noodle soup Hard Candy   MEDICATION INSTRUCTIONS  Unless otherwise instructed, you should take regular prescription medications with a small sip of water as early as possible the morning of your procedure.               OTHER INSTRUCTIONS  You will need a responsible adult at least 75 years of age to accompany you and drive you home.   This person must remain in the waiting room during your procedure.  Wear loose fitting clothing that is easily removed.  Leave jewelry and other valuables at home.  However, you may wish to bring a book to read or an iPod/MP3 player to listen to music as you wait for your procedure to start.  Remove all body piercing jewelry and leave at home.  Total time from sign-in until discharge is approximately 2-3 hours.  You should go home directly after  your procedure and rest.  You can resume normal activities the day after your procedure.  The day of your procedure you should not:   Drive   Make legal decisions   Operate machinery   Drink alcohol   Return to work  You will receive specific instructions about eating, activities and medications before you leave.    The above instructions have been reviewed and explained to me by   _______________________    I fully understand and can verbalize these instructions _____________________________ Date _________

## 2010-07-19 NOTE — Assessment & Plan Note (Signed)
Summary: EGD follow up    History of Present Illness Visit Type: Follow-up Visit Primary GI MD: Stan Head MD [facility] Primary Provider: Willow Ora, MD Requesting Provider: na Chief Complaint: abd pain  History of Present Illness:   77 yo [place] man  with gastric ulcer diagnosed 1229. He was H. pylori positive and on Fosamax. The patient had been using intermittent Nexium at that time. He was complaining of left upper quadrant pain and some chest discomfort.    he was prescribed Biaxin and amoxicillin along with PPI for his H. pylori. review today shows that he took about half of the amoxicillin. He had 18 500 mg capsules remaining in his bottle. He feels much better with only mild very self-limited postprandial tightness in the left upper quadrant at times. There is no weight loss. There is no sign of bleeding.      GI Review of Systems      Denies abdominal pain, acid reflux, belching, bloating, chest pain, dysphagia with liquids, dysphagia with solids, heartburn, loss of appetite, nausea, vomiting, vomiting blood, weight loss, and  weight gain.        Denies anal fissure, black tarry stools, change in bowel habit, constipation, diarrhea, diverticulosis, fecal incontinence, heme positive stool, hemorrhoids, irritable bowel syndrome, jaundice, light color stool, liver problems, rectal bleeding, and  rectal pain.    Current Medications (verified): 1)  Losartan Potassium-Hctz 50-12.5 Mg Tabs (Losartan Potassium-Hctz) .... 1/2 Tablet By Mouth in The Morning and 1/2  Tablet By Mouth At Night 2)  Synthroid 100 Mcg Tabs (Levothyroxine Sodium) .... Take 1 Tablet By Mouth Once A Day 3)  Kls Aller-Tec 10 Mg Tabs (Cetirizine Hcl) .... As Needed 4)  Flonase 50 Mcg/act  Susp (Fluticasone Propionate) .... 2 Sprays Each Nostil Once Daily Prn 5)  Multivitamins   Tabs (Multiple Vitamin) 6)  Oscal 500/200 D-3 500-200 Mg-Unit Tabs (Calcium-Vitamin D) .Marland Kitchen.. 1 By Mouth Daily 7)  Omega-3 .... Take Once  Daily 8)  Viagra 100 Mg Tabs (Sildenafil Citrate) .... 1/2 or 1 Tab No More Often Than Once A Day 9)  Ecasa .Marland Kitchen.. 81 Mg 1x Per Day. 10)  Omeprazole 40 Mg Cpdr (Omeprazole) .Marland Kitchen.. 1 By Mouth Once Daily .  Start Once The Nexium Samples Are Gone  Allergies (verified): No Known Drug Allergies  Past History:  Past Medical History:  Aortic insufficincy Hypertension Hypothyroidism EPISTAXIS-- f/u per ENT h/o ADENOCARCINOMA, COLON status post right hemicolectomy and  chemotherapy.  F/U with the cancer center and Dr. Ezzard Standing routinely. Had a Cscope Ezzard Standing) 2005 and 2008  INSOMNIA  ECZEMA  LEG CRAMPS  HAY FEVER   ERECTILE DYSFUNCTION  Osteopenia-- per DEXA 07-2008 (Rx fosamax) Hiatal Hernia  Gastric Ulcer 12/11  Past Surgical History: Reviewed history from 05/18/2010 and no changes required. Inguinal herniorrhaphy Right hemicolectomy with side-to-side terminal ileum to left transverse colon anastomosis.  Family History: Reviewed history from 04/07/2010 and no changes required. DM--no MI--no Colon ca--no Prostate ca--no  Social History: Reviewed history from 05/18/2010 and no changes required. original from [place] Married vegetarian two children tobacco--never ETOH--no Daily Caffeine Use one per day  Vital Signs:  Patient profile:   77 year old male Height:      62 inches Weight:      132 pounds BMI:     24.23 BSA:     1.60 Pulse rate:   64 / minute Pulse rhythm:   regular BP sitting:   136 / 80  (left arm) Cuff size:  regular  Vitals Entered By: Ok Anis CMA (July 12, 2010 8:23 AM)  Physical Exam  General:  Well developed, well nourished, no acute distress. Eyes:  anicteric Lungs:  Clear throughout to auscultation. Heart:  Regular rate and rhythm; no murmurs, rubs,  or bruits. Abdomen:  Soft, nontender and nondistended. No masses, hepatosplenomegaly or hernias noted. Normal bowel sounds.   Impression & Recommendations:  Problem # 1:  GASTRIC ULCER,  ACUTE (ICD-531.30) Assessment Improved His symptoms that go along with this are improved.The etiology is likely a combination of the Fosamax and H. pylori. He is under treated for the H. pylori but it's possible it is eradicated. plan to repeat endoscopy with having him hold omeprazole 14 days before to check for eradication of the H. pylori. he is still off the Fosamax which was for ostial osteopenia. At some point can followup with Dr. Windle Guard about that the risk benefit ratio may not favor continuation in the overall situation. Orders: EGD (EGD)  Problem # 2:  PEPTIC ULCER DISEASE, HELICOBACTER PYLORI POSITIVE (ICD-533.90) Assessment: New He has taken Biaxin, amoxicillin and omeprazole for this but only took half of the amoxicillin. Will reassess for eradication at upper GI endoscopy in March.  Orders: EGD (EGD)  Patient Instructions: 1)  You have been scheduled for a repeat Endoscopy on 08/11/10. 2)  We have given you written instructions. 3)  Please stop your omeprazole 14 days prior to your procedure. 4)  Copy sent to : Willow Ora MD 5)  Upper Endoscopy brochure given.  6)  The medication list was reviewed and reconciled.  All changed / newly prescribed medications were explained.  A complete medication list was provided to the patient / caregiver.

## 2010-08-11 ENCOUNTER — Other Ambulatory Visit: Payer: Medicare Other | Admitting: Internal Medicine

## 2010-08-11 ENCOUNTER — Telehealth: Payer: Self-pay | Admitting: Internal Medicine

## 2010-08-11 NOTE — Telephone Encounter (Signed)
Patient has been rescheduled with Dr Leone Payor for 08/19/10

## 2010-08-12 ENCOUNTER — Encounter (HOSPITAL_BASED_OUTPATIENT_CLINIC_OR_DEPARTMENT_OTHER): Payer: Medicare Other | Admitting: Oncology

## 2010-08-12 ENCOUNTER — Other Ambulatory Visit: Payer: Self-pay | Admitting: Oncology

## 2010-08-12 DIAGNOSIS — C184 Malignant neoplasm of transverse colon: Secondary | ICD-10-CM

## 2010-08-12 LAB — CEA: CEA: 2.3 ng/mL (ref 0.0–5.0)

## 2010-08-18 ENCOUNTER — Encounter: Payer: Self-pay | Admitting: Internal Medicine

## 2010-08-19 ENCOUNTER — Encounter: Payer: Self-pay | Admitting: Internal Medicine

## 2010-08-19 ENCOUNTER — Ambulatory Visit (AMBULATORY_SURGERY_CENTER): Payer: Medicare Other | Admitting: Internal Medicine

## 2010-08-19 VITALS — BP 124/68 | HR 63 | Temp 97.2°F | Resp 19 | Ht 63.0 in | Wt 130.0 lb

## 2010-08-19 DIAGNOSIS — K2990 Gastroduodenitis, unspecified, without bleeding: Secondary | ICD-10-CM

## 2010-08-19 DIAGNOSIS — K449 Diaphragmatic hernia without obstruction or gangrene: Secondary | ICD-10-CM

## 2010-08-19 DIAGNOSIS — Z8711 Personal history of peptic ulcer disease: Secondary | ICD-10-CM

## 2010-08-19 DIAGNOSIS — K2970 Gastritis, unspecified, without bleeding: Secondary | ICD-10-CM

## 2010-08-19 NOTE — Patient Instructions (Signed)
The ulcer is gone. We checked to see if the Helicobacter pylori infection is gone also and will let you know with a letter or phone call. Please start taking your omeprazole again, and your aspirin along with the other medications on the list.

## 2010-08-22 ENCOUNTER — Telehealth: Payer: Self-pay | Admitting: *Deleted

## 2010-08-22 ENCOUNTER — Encounter: Payer: Self-pay | Admitting: Internal Medicine

## 2010-08-22 DIAGNOSIS — K2970 Gastritis, unspecified, without bleeding: Secondary | ICD-10-CM

## 2010-08-22 DIAGNOSIS — K2990 Gastroduodenitis, unspecified, without bleeding: Secondary | ICD-10-CM

## 2010-08-22 LAB — HELICOBACTER PYLORI SCREEN-BIOPSY: UREASE: NEGATIVE

## 2010-08-22 NOTE — Progress Notes (Signed)
Quick Note:  I will create a letter - H. Pylori is gone. ______

## 2010-08-22 NOTE — Telephone Encounter (Signed)
Follow up Call- Patient questions:  Do you have a fever, pain , or abdominal swelling? no Pain Score  0 *  Have you tolerated food without any problems? yes  Have you been able to return to your normal activities? yes  Do you have any questions about your discharge instructions: Diet   no Medications  no Follow up visit  no  Do you have questions or concerns about your Care? yes  Actions: * If pain score is 4 or above: No action needed, pain <4.  Pt. Had questions about findings,explained to pt. What physician findings were and that when he get results of tissue back that he  will be contacted with results. Pt. Stated he understood.

## 2010-08-22 NOTE — Progress Notes (Signed)
Quick Note:  No egd recall needed ______

## 2010-08-23 NOTE — Procedures (Signed)
Summary: Upper Endoscopy  Patient: [name] Note: All result statuses are Final unless otherwise noted.  Tests: (1) Upper Endoscopy (EGD)   EGD Upper Endoscopy       DONE     [facility]     [address]     [address], [place]  [address]          ENDOSCOPY PROCEDURE REPORT          PATIENT:  [name], [name]  MR#:  [id number]     BIRTHDATE:  07-30-1928, 82 yrs. old  GENDER:  male          ENDOSCOPIST:  Iva Boop, MD, [facility]          PROCEDURE DATE:  08/19/2010     PROCEDURE:  EGD with biopsy for H. pylori 24401     ASA CLASS:  Class II     INDICATIONS:  follow-up of gastric ulcer - prior gastric ulcer,     s/p partial H. pylori Tx (did not take all amoxicillin)          MEDICATIONS:   Fentanyl 25 mcg IV, Versed 2 mg     TOPICAL ANESTHETIC:  Exactacain Spray          DESCRIPTION OF PROCEDURE:   After the risks benefits and     alternatives of the procedure were thoroughly explained, informed     consent was obtained.  The LB GIF-H180 [id number] endoscope was     introduced through the mouth and advanced to the second portion of     the duodenum, without limitations.  The instrument was slowly     withdrawn as the mucosa was fully examined.     <<PROCEDUREIMAGES>>          Small area of scarring in pre-pyloric antrum where ulcer was seen     in December.  Mild gastritis was found in the antrum. Mild     erythema and mottling. A biopsy for H. pylori was taken. (CLO)  A     hiatal hernia was found. It was 2 cm in size.  Otherwise the     examination was normal.    Retroflexed views revealed a hiatal     hernia.    The scope was then withdrawn from the patient and the     procedure completed.          COMPLICATIONS:  None          ENDOSCOPIC IMPRESSION:     1) Scar in the antrum - ulcer is healed     2) Mild gastritis in the antrum - CLO test for H. pylori taken     3) 2 cm hiatal hernia     4) Otherwise normal examination     RECOMMENDATIONS:     Resume  all medications on his list, including omeprazole - will     notify re: H. pylori status when biopsies return.          REPEAT EXAM:  In for as needed.          Iva Boop, MD, [facility]          CC:  The Patient          n.     eSIGNED:   Iva Boop at 08/19/2010 08:33 AM          Wylene Simmer, [id number]  Note: An exclamation mark (!) indicates a result that was not dispersed into the  flowsheet. Document Creation Date: 08/19/2010 8:34 AM _______________________________________________________________________  (1) Order result status: Final Collection or observation date-time: 08/19/2010 08:18 Requested date-time:  Receipt date-time:  Reported date-time:  Referring Physician:   Ordering Physician: Stan Head [id number]) Specimen Source:  Source: Launa Grill Order Number: [id number] Lab site:

## 2010-10-04 NOTE — Assessment & Plan Note (Signed)
[facility]                            CARDIOLOGY OFFICE NOTE   NAME:[name], [name]                           MRN:          [id number]  DATE:07/17/2008                            DOB:          1928-06-30    IDENTIFICATION:  [name] is a 77 year old gentleman I last saw him in  August.   He comes in today earlier than expected complaining of chest tightness.  He says for the past 5 weeks, he has had tightness.  Before that he was  doing okay.  It is very difficult to get a history of what this is.  It  sounds like it is almost continuous, though question of activity makes  it some worse, so he has cut back on activity.  He denies any episodes  of waking from sleep.  Denies dizziness.   CURRENT MEDICINES:  1. Benazepril HCTZ 20/12.5 daily, question 2 tablets.  2. Levoxyl 0.1.  3. Aspirin 81.  4. Glucosamine daily.  5. Multivitamin.  6. Nexium.   PHYSICAL EXAMINATION:  GENERAL:  The patient is in no distress.  VITAL SIGNS:  Blood pressure is 122/62, pulse is 69 and regular, weight  129, up 6 pounds from August.  Lungs are clear.  No rales or wheezes.  CARDIAC:  Regular rate and rhythm, grade 2-3/6 systolic murmur heard  best at the left sternal border radiating to the base.  ABDOMEN:  Supple, nontender.  EXTREMITIES:  Good distal pulses.  No edema.   A 12-lead EKG shows normal sinus rhythm 70 beats per minute.   IMPRESSION:  Chest pressure.  I had a hard time getting a history from  him with the language differences.  Based on this and his age, he is at  risk for coronary artery disease, set him up for an adenosine Myoview.  On the day this was done, we will also get an echocardiogram to evaluate  his aortic insufficiency.   I would not make any medicine changes for now.  I would keep him on the  same and tell him to take activities as tolerated.   Tentatively, I will set followup for the fall, but will be in touch with  him once I have seen the  tests.  I will also contact the son in [place].    Pricilla Riffle, MD, [facility]  Electronically Signed   PVR/MedQ  DD: 07/17/2008  DT: 07/18/2008  Job #: [id number]

## 2010-10-04 NOTE — Assessment & Plan Note (Signed)
[facility]                            CARDIOLOGY OFFICE NOTE   NAME:HOQuenton, [name]                           MRN:          [id number]  DATE:01/10/2008                            DOB:          August 12, 1928    IDENTIFICATION:  [name] is a 77 year old gentleman, I saw him last 2  years ago in 2007.  I saw him initially for chest pain.  He had normal  perfusion on Myoview, some EKG changes and 4 minutes of recovery.  He  also had an echocardiogram that showed mild-to-moderate aortic valve  insufficiency.  I recommended to follow up in 1 year.   In the interval, he has done okay.  He is followed by Dr. Drue Novel.  He said  a couple months ago, he has had some chest pressure, but since then it  has gone, he has not had any.   He is not the most active, walks on but not on a regular basis.  Denies  lightheadedness.  Breathing is okay.   CURRENT MEDICINES:  1. Benazepril and hydrochlorothiazide 20/12.5.  2. Levoxyl 0.1.  3. Aspirin 81.  4. Glucosamine.  5. Multivitamin.  6. Nexium.   PHYSICAL EXAMINATION:  GENERAL:  On exam, the patient is in no distress.  VITAL SIGNS:  Blood pressure is 111/60, pulse is 63, and weight 123  pounds.  NECK:  Neck veins are flat.  JVP is normal.  No thyromegaly.  LUNGS:  Clear to auscultation.  CARDIAC:  Regular rate rhythm, S1and S2, grade 1/6 systolic murmur heard  best at the left sternal border.  No audible diastolic murmurs.  The PMI  not displaced.  ABDOMEN:  Benign.  No hepatomegaly.  EXTREMITIES:  No edema.   A 12-lead EKG, normal sinus bradycardia, 58 beats per minute.   IMPRESSION:  1. Aortic insufficiency.  I am not impressed by diastolic murmur even      having him lean and exhale.  His insufficiency must be mild.  I      will review the echocardiogram, but I would not repeat at this time      and continue activities as tolerated.  2. Hypertension, good control.  3. Chest pressure.  An episode a couple months  ago.  He has not had      recurrence.  I am not convinced there is any evidence for active      ischemia, I would continue to follow.   We will review the patient's health care maintenance in regards to his  lipid panel.  I will be in touch with the patient's son who is a  Environmental health practitioner in [place] to relate is his father's  progress, otherwise I will set followup in 1 year, which the patient  would like.  I reassured him and again encouraged him to remain active.     Pricilla Riffle, MD, [facility]  Electronically Signed    PVR/MedQ  DD: 01/10/2008  DT: 01/10/2008  Job #: [id number]   cc:   Willow Ora, MD

## 2010-10-04 NOTE — Op Note (Signed)
NAME:  [name], [name]                  ACCOUNT NO.:  [id number]   MEDICAL RECORD NO.:  [id number]          PATIENT TYPE:  AMB   LOCATION:  ENDO                         FACILITY:  [facility]   PHYSICIAN:  Sandria Bales. Ezzard Standing, M.D.  DATE OF BIRTH:  Sep 25, 1928   DATE OF PROCEDURE:  10/08/2006  DATE OF DISCHARGE:                               OPERATIVE REPORT   PREOPERATIVE DIAGNOSIS:  History of colon cancer.   POSTOPERATIVE DIAGNOSIS:  History of right colon cancer.   PROCEDURE:  Flexible colonoscopy.   SURGEON:  Sandria Bales. Ezzard Standing, M.D.   ANESTHESIA:  50 mg of Demerol, 3 mg of Versed.   INDICATIONS FOR PROCEDURE:  [name] is a 77 year old [name] who  had a right hemicolectomy January 2004 by Dr. Ovidio Kin.  He had a  T3, N1 carcinoma with 2 of 9 nodes positive.  He underwent adjuvant  chemotherapy supervised by Dr. Truett Perna and has done well since that  time.   He now comes for interval colonoscopy for followup of his colon cancer.  The indications and potential complications were explained to the  patient and the patient had some prior colonoscopies and understands the  procedure.   PROCEDURAL NOTE:  The patient was in a left lateral decubitus position.  He completed a mechanical bowel prep at home using HalfLytely.  He was  given 50 mg of Demerol, 3 mg of Versed at the initiation of the  procedure.   A flexible adult Pentax colonoscope was passed transanally without  difficulty.  The scope was advanced to 110 cm to his tranverse colon.  The ileocolonic anastomosis was visualized.  The transverse colon, right  colon, sigmoid colon and rectum were all within normal limits.  I saw no  polyps, no growths, no suspicious masses at all.  He did have some stool  stuck to the colon wall, but I was able to irrigate these areas out  without any difficulty.   This is now his second colonoscopy since his surgery.  I would think his  next colonoscopy can be in 5 years at this time unless  he should have  some interval problems or symptoms.      Sandria Bales. Ezzard Standing, M.D.  Electronically Signed    DHN/MEDQ  D:  10/08/2006  T:  10/08/2006  Job:  [id number]   cc:   Leighton Roach. Truett Perna, M.D.  [place]ow Ora, MD

## 2010-10-07 NOTE — Discharge Summary (Signed)
  NAME:  [name], [name]                              ACCOUNT NO.:  [id number]   MEDICAL RECORD NO.:  [id number]                   PATIENT TYPE:  INP   LOCATION:  0354                                 FACILITY:  [facility]   PHYSICIAN:  David H. Newman, M.D.               DATE OF BIRTH:  09/27/1928   DATE OF ADMISSION:  06/05/2002  DATE OF DISCHARGE:  06/11/2002                                 DISCHARGE SUMMARY   DISCHARGE DIAGNOSES:  1. 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  2. Bilateral inguinal hernias.  3. Hypothyroidism corrected on Synthroid.  4. Hypertension on Diovan.   OPERATION PERFORMED:  Right hemicolectomy on June 05, 2002.   HISTORY OF PRESENT ILLNESS:  [name] is a pleasant 77-year-old Oriental male  who has bilateral inguinal hernias. He is a patient of Dr. Agiar. On rectal  exam, he had guaiac positive stools. I did a colonoscopy and identified a  tumor of his right transverse colon. He completed a workup including a CT  scan of his chest which showed a nodular infiltrate in the posterior aspect  of his lingula. CT of his abdomen which showed a lucency with an L3 and CT  of his pelvis which showed a left inguinal hernia but all of these were felt  to be probably not related to his tumor.   He completed an antibiotic and mechanical bowel prep and then came to the  hospital on the 15th of January 2004 where he underwent a right  hemicolectomy. At the time of laparotomy, I saw no evidence of metastatic  disease.   Postoperatively, he did very well. His first postoperative day his  hemoglobin was 11, hematocrit 35, white blood count 13,200. His sodium was  131, potassium 3.8, chloride 104, CO2 of 24, glucose of 137 and BUN of 6. He  was kept n.p.o. for about 4 days and then started on clear liquids. He had  been rapidly advanced to a full liquid diet. We have not gone any further  but he is actually  vegetarian and will let him kind of advance his own diets  at home.   His final pathology did show an invasive adenocarcinoma which was a T3 tumor  with 2/9 nodes N1. I made copies of his path report that he could present to  his son.   DISCHARGE INSTRUCTIONS:  1. Vicodin for pain.  2. He should take an over the counter vitamin.  3. He is to do no heavy lifting or driving for about a week.  4. His diet has no restrictions.  5. He can shower and will take his staples out on admission.  6. His follow up is to see me in 7-10 days.                                                 David H. Newman, M.D.    DHN/MEDQ  D:  06/11/2002  T:  06/11/2002  Job:  [id number]   cc:   Dr. A_______________  [place]   Coty Rettke, M.D.  [employer]  [address]  [place], [place] [address] 

## 2010-10-07 NOTE — Op Note (Signed)
NAME:  Cardella, Gaylan                              ACCOUNT NO.:  [id number]   MEDICAL RECORD NO.:  [id number]                   PATIENT TYPE:  AMB   LOCATION:  ENDO                                 FACILITY:  [facility]   PHYSICIAN:  Sandria Bales. Ezzard Standing, M.D.               DATE OF BIRTH:  05/18/1929   DATE OF PROCEDURE:  10/20/2001  DATE OF DISCHARGE:                                 OPERATIVE REPORT   PREOPERATIVE DIAGNOSIS:  Guaiac positive stools and change in bowel habits.   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   PROCEDURE:  Upper esophagogastroduodenoscopy and flexible colonoscopy.   SURGEON:  Sandria Bales. Ezzard Standing, M.D.   ANESTHESIA:  50 mg Demerol, 3 mg Versed.   COMPLICATIONS:  None.   INDICATIONS FOR PROCEDURE:  The patient is a 77 year old [name] who  is a patient of Dr. Chana Bode who came to me for bilateral inguinal hernias.  On my physical examination he had grossly Guaiac positive stool. I suggested  the patient have an upper and lower endoscopy to evaluate the cause of this.  He has completed a GoLYTELY bowel prep at home and now comes for attempted  upper endoscopy and colonoscopy.   DESCRIPTION OF PROCEDURE:  He is on nasal oxygen with an IV in his right  wrist. He is on telemetry, pulse oxymetry and has a blood pressure cuff on.  He is given 50 mg of Demerol and 3 mg of Versed at the initiation of the  procedure and the back of his throat is anesthetized with Cetacaine.   I did the upper endoscopy first, passing an Olympus upper endoscope into his  stomach without difficulty. I cannulated the second and third portions of  the duodenum, identified the pylorus, identified the stomach and  retroflexing I identified the cardia. His esophagogastric junction was at  about 36 cm from his incisors. His esophagus was normal and his upper  endoscopy examination was entirely normal with no evidence of ulcer, lesion  or  mass.   We then turned the bed around. I then started the colonoscopy with the  flexible Olympus colonoscope. This was passed around to the cecum.   The ileocecal valve was identified as was the cecum. The patient had 1 or 2  polyps in his proximal right colon, however, the most remarkable lesion was  right at the hepatic flexure, probably a little bit into the transverse  colon area of the hepatic flexure. The patient had a large irregular lesion  consistent with a primary colon carcinoma that occupied 30% to 40% of the  lumina of the bowel. Because it was this was obviously an abnormal lesion  that had to be resected, I did not biopsy it.   The remainder of his transverse colon, left colon and sigmoid colon were  unremarkable except for some scattered diverticula of the  sigmoid colon. The  scope was withdrawn into his rectum. The scope was retroflexed within the  rectum and unremarkable. There was no evidence of any hemorrhoids or rectal  mass.   The patient tolerated the procedure well. His wife was at his bedside at the  procedure. I discussed with him the findings of my colonoscopy. I also drew  a diagram of the location of the tumor and gave them photographs of the  tumor.   I also spoke to his son, Larico Dimock, who is an Geophysicist/field seismologist  professor  of  hematology/oncology at [employer] about the findings, and my suggesting for  further diagnostic workup and surgery.  Dr. Anselm Jungling is going to talk with his  father, Kaushal Vannice, whether he wants to have this done in [place] or  wants to travel to [place] for further evaluation. I told him I would be  happy to help in either way.                                               Sandria Bales. Ezzard Standing, M.D.    DHN/MEDQ  D:  05/27/2002  T:  05/27/2002  Job:  [id number]   cc:   Dr. Estill Batten, M.D.  [facility] [place], [place] [address]

## 2010-10-07 NOTE — Op Note (Signed)
NAME:  [name], [name]                              ACCOUNT NO.:  [id number]   MEDICAL RECORD NO.:  [id number]                   PATIENT TYPE:  INP   LOCATION:  0002                                 FACILITY:  [facility]   PHYSICIAN:  Sandria Bales. Ezzard Standing, M.D.               DATE OF BIRTH:  Dec 22, 1928   DATE OF PROCEDURE:  06/05/2002  DATE OF DISCHARGE:                                 OPERATIVE REPORT   SYSTEM NUMBER:  [id number]   PREOPERATIVE DIAGNOSIS:  Right transverse colon carcinoma.   POSTOPERATIVE DIAGNOSIS:  Colon cancer, located right at the hepatic  flexure/right transverse colon.  Approximately 3.5 cm in size.   DESCRIPTION OF PROCEDURE:  Right hemicolectomy with side-to-side terminal  ileum to left transverse colon anastomosis.   SURGEON:  Sandria Bales. Ezzard Standing, M.D.   ASSISTANT:  Rose Phi. Maple Hudson, M.D.   ANESTHESIA:  General endotracheal.   ESTIMATED BLOOD LOSS:  Minimal.   INDICATIONS FOR PROCEDURE:  The patient is a 77 year old male who I  originally saw in December for bilateral inguinal hernias.  He had guaiac-  positive stool on my examination, and therefore I did a colonoscopy on him.  This revealed a tumor of his right transverse colon.  A CT scan showed no  obvious metastatic disease, though it does suggest a change in the lingula  of his left lung.  His CEA is 1.1.  I discussed with him proceeding with  surgery.  I discussed the indications, potential complications included, but  not limited to:  infection, bleeding, bowel leak.  The patient now comes in  after completing a mechanical and antibiotic bowl prep for colon resection.   DESCRIPTION OF PROCEDURE:  He is placed in the supine position.  Anesthesia  is supervised by Dr. Okey Dupre.  His abdomen was prepped with Betadine solution  and sterilely draped.  He had an orogastric tube in place and a Foley  catheter in place.  PA stockings were in place.  He is given 1 gm of  Cefotetan at the initiation of the procedure.   A  midline incision was made with sharp dissection and carried down to  abdominal cavity.  The abdomen was then explored.  His right and left lobes  of liver were palpated and unremarkable.  His gallbladder was palpated, had  no palpable stones.  The stomach was unremarkable with an NG tube in place.  His retroperitoneum was unremarkable.  His small bowel was run from the  ligament of Treitz to the terminal ileum.  He did have some scarring down  toward the end of the terminal ileum, but this appeared to be benign.  He  has probably had this long term and certainly we saw no evidence of  metastatic disease; saw no evidence of Meckel's diverticula.  His tumor was  palpable right beyond the hepatic flexure in his right transverse colon; it  did not grossly go through the wall of the bowel.  I felt no  lymphadenopathy; he had no retroperitoneal tumors or masses.  At this time I  did dissection of right colon, first identifying about 5-8 cm beyond the  tumor in the right transverse colon (about mid transverse colon).  Divided  this with a GIA-75 stapler.  I then went into the terminal ileum and divided  this with a GIA-75 stapler.  I entered the mesentery down.  I found the main  branch of the middle colic, which fed this right colon tumor, and took this  down to the base of the mesentery.  I identified the duodenum and reflected  this laterally and posteriorly.  I identified the ureter, and this was kept  more posterior to the dissection.  The mesentery was divided and ligated  using 2-0 silk sutures.  After the specimen was completely removed, I opened  it on the side table just to evaluate and make sure I had adequate margins  (which it appeared that I did).  This specimen was sent for permanent  pathology.   I then closed the mesentery with a running 2-0 chromic suture.  I then lined  up the two ends of stapled bowel and did a stapled side-to-side jejunal to  transverse colon anastomosis,  with a GIA-75 stapler.  I inspected the staple  line; there was minimal bleeding.  I did suture one spot.  I then closed the  enterotomy with interrupted 3-0 silk sutures.   I then laid the anastomosis to the right side of the bowel.  I then  irrigated out the abdomen.  I found no evidence of any bleeding at any site  along where we had taken the bowel down.  I used about 3 L of irrigation.   I then closed the abdomen with two running #1 PDS sutures, stapled the skin.  Because of his right colon anastomosis and it went fairly smoothly, I had NG  tube removed at the end of the procedure.  Will remove the NG tube out and  will leave the Foley catheter in place for at least overnight.   Final pathology is pending at time of this dictation.  Sponge and needle  count were correct.                                               Sandria Bales. Ezzard Standing, M.D.    DHN/MEDQ  D:  06/05/2002  T:  06/05/2002  Job:  [id number]   cc:   Dr. Riley Nearing, M.D.  [place], [place]   Dr. Kathleene Hazel, M.D.  [facility]  [place], [place]

## 2010-10-07 NOTE — Assessment & Plan Note (Signed)
[facility]                              CARDIOLOGY OFFICE NOTE   NAME:HOLannie, Yusuf                           MRN:          [id number]  DATE:12/29/2005                            DOB:          03/12/1929    IDENTIFICATION:  Mr. Maffett is a 77 year old gentleman who I saw for the first  time back on November 10, 2005, refer to this for full details.  He was  evaluated for chest pain.   History was difficult given language issues.  I went ahead and set him up  for a Myoview scan.  This was done on November 15, 2005.  There was slight ST  depression in recovery 1-mm, that improved by 6 minutes of recovery, normal  perfusion overall.  I felt it to be a low risk scan.  EF was 64%.   In the interval, I also started the patient on Nexium and he says he has  felt better with this, less chest pressure is active, notes only occasional  shortness of breath.   CURRENT MEDICATIONS:  1. Diovan 160 every day.  2. Synthroid every day.  3. Glucosamine every day.  4. Allerclear every day.  5. Bayer aspirin.  6. Flonase p.r.n.  7. Nexium 40 every day.   PHYSICAL EXAMINATION:  GENERAL:  The patient is in no distress.  VITAL SIGNS:  Blood pressure is 122/62, pulse is 73, weight 130.  LUNGS:  Clear.  CARDIAC:  Regular rate and rhythm.  Grade 2/6 systolic murmur heard best at  the left sternal border that was sitting, goes to the base.  ABDOMEN:  Benign.  EXTREMITIES:  Good pulses.  No edema.   ASSESSMENT:  1. Chest pain.  Appears to be more gastrointestinal.  A Myoview showed      normal perfusion.  I have given him refills for Nexium.  2. Murmur.  We will set him up for an echocardiogram to evaluate the      aortic valve, may just indeed be sclerosis.  3. Dyslipidemia.  I reviewed his lipids from April.  Try to cut down on      his carbohydrates as triglycerides were 323.  LDL was good at 80.  HDL      should improve with a drop in triglycerides.  It was now 7.  Would      follow up periodically. It sounds like the patient's diet has some      carbohydrates that he can work on.   I have set no definite followup unless his symptoms worsen/change.  Note,  chest x-ray done on last visit showed mild cardiomegaly.  Again, there was  mild wedging of the vertebrae near the thoracolumbar junction.  Echocardiogram will fully define size better.                                Pricilla Riffle, MD, [facility]    PVR/MedQ  DD:  12/29/2005  DT:  12/29/2005  Job #:  [id number]   cc:  Kathlene November, MD

## 2010-10-07 NOTE — Op Note (Signed)
NAME:  [name], [name]                              ACCOUNT NO.:  [id number]   MEDICAL RECORD NO.:  [id number]                   PATIENT TYPE:  AMB   LOCATION:  DSC                                  FACILITY:  [facility]   PHYSICIAN:  Sandria Bales. Ezzard Standing, M.D.               DATE OF BIRTH:  07-Feb-1929   DATE OF PROCEDURE:  07/21/2002  DATE OF DISCHARGE:                                 OPERATIVE REPORT   PREOPERATIVE DIAGNOSIS:  Colon cancer receiving chemotherapy.   POSTOPERATIVE DIAGNOSIS:  Colon cancer receiving chemotherapy, need of IV  access.   OPERATION/PROCEDURE:  Left subclavian ex-port, (Bard).   ASSISTANT:  None.   ANESTHESIA:  MAC with 15 cc of 1% Xylocaine.   COMPLICATIONS:  None.   INDICATIONS FOR PROCEDURE:  The patient is a 77 year old white male who has  T3 N1 colon carcinoma receiving chemotherapy by Dr. Mancel Bale.  He now  comes for placement of a Port-A-Cath.  The indications, potential  complications were explained to the patient.  The potential complications  including but not limited to bleeding, infection, pneumothorax and venous  thrombosis.   DESCRIPTION OF PROCEDURE:  The patient was placed in a supine position with  a roll under his back.  His upper chest was prepped and draped with Betadine  solution and given 1 g of Ancef initially at the procedure.  I used the Bard  ex-port for placement.  I accessed the left subclavian vein with the 14-  gauge needle, threaded the guide wire into the superior vena cava.  I  checked this with fluoroscopy.   I then developed a pocket in the upper inner aspect of his left chest,  threaded the silastic tubing from the pocket to the left subclavian sixth  site and then inserted into the subclavian vein using an 8.5 French  introducer and checked this with fluoroscopy, placing the position of the  tip at the junction of the superior vena cava, right atrium.   I then attached the reservoir, flushed the entire unit with dilute  and  concentrated heparin and the concentrated heparin being 100 units per cc.  I  then checked again the position of the reservoir, the silastic tubing.  All  were in good position.  The reservoir was sewn in place with a 3-0 Vicryl  suture.  The subcutaneous tissues were closed with 3-0 Vicryl suture and the  skin was closed with a 5-0 Vicryl suture, painted with Benzoin and Steri-  Strips.   The patient tolerated the procedure well.  Chest x-ray is pending at the  time of this dictation.  He is actually scheduled for his chemotherapy  through Dr. Mancel Bale.  Sandria Bales. Ezzard Standing, M.D.    DHN/MEDQ  D:  07/21/2002  T:  07/21/2002  Job:  [id number]   cc:   Riley Nearing, M.D.  [place], [place]   Jillyn Hidden B. Truett Perna, M.D.  [address]- [facility]  [place]  [place]  [address]  Fax: [phone]   Toni Amend, M.D.  [facility] [employer] [facility], [place], [place]

## 2010-10-07 NOTE — Op Note (Signed)
NAME:  [name], [name]                              ACCOUNT NO.:  [id number]   MEDICAL RECORD NO.:  [id number]                   PATIENT TYPE:  AMB   LOCATION:  ENDO                                 FACILITY:  [facility]   PHYSICIAN:  Sandria Bales. Ezzard Standing, M.D.               DATE OF BIRTH:  July 01, 1928   DATE OF PROCEDURE:  07/17/2003  DATE OF DISCHARGE:                                 OPERATIVE REPORT   PREOPERATIVE DIAGNOSES:  History of 3B carcinoma of the right colon.   POSTOPERATIVE DIAGNOSES:  Normal anastomosis with rare sigmoid colon  diverticula.   PROCEDURE:  Colonoscopy.   SURGEON:  Sandria Bales. Ezzard Standing, M.D.   ANESTHESIA:  50 mg Demerol, 3 mg Versed.   COMPLICATIONS:  None.   INDICATIONS FOR PROCEDURE:  [name] is a 78 year old male who had a right  hemicolectomy in January of 2004.  He had a T3 carcinoma with 2 of 9 nodes  positive.  He underwent chemotherapy by Dr. Mancel Bale which he has done  very well with.  He had a recent CT scan by Dr. Truett Perna dated May 29, 2003 which shows no evidence of residual disease. He now comes for followup  colonoscopy.  The patient completed a bowel prep at home. He had a pulse  oximeter and EKG, blood pressure cuff and was placed on nasal O2 and had his  Port-A-Cath accessed.   He was given 50 mg Demerol and 3 mg of Versed and a flexible Olympus  colonoscope was passed around to his right transverse colon.  The  anastomosis was visualized, there was no polyp or mucosal lesion of the  transverse colon or left colon.  He did have some scattered sigmoid  diverticula but had no mucosal lesion and within the rectum, there was no  mucosal lesion, the scope was retroflexed.   This was felt to be a normal exam except for the few sigmoid diverticula.  His next colonoscopy should be probably in three years unless he should have  any symptoms or other intervening problems.                                               Sandria Bales. Ezzard Standing, M.D.    DHN/MEDQ  D:  07/17/2003  T:  07/17/2003  Job:  [id number]   cc:   Dr. Salli Quarry B. Truett Perna, M.D.  [address]  [place]  [place] [address]  Fax: [phone]   Dr. Minette Headland [employer]

## 2010-10-07 NOTE — Op Note (Signed)
NAME:  [name], [name]                              ACCOUNT NO.:  [id number]   MEDICAL RECORD NO.:  [id number]                   PATIENT TYPE:  OBV   LOCATION:  0478                                 FACILITY:  [facility]   PHYSICIAN:  Sandria Bales. Ezzard Standing, M.D.               DATE OF BIRTH:  July 26, 1928   DATE OF PROCEDURE:  08/18/2002  DATE OF DISCHARGE:                                 OPERATIVE REPORT   PREOPERATIVE DIAGNOSIS:  Bilateral inguinal hernias.   POSTOPERATIVE DIAGNOSIS:  Bilateral medium to large indirect inguinal  hernias.   PROCEDURE:  Laparoscopic bilateral inguinal herniorrhaphy with precut Atrium  mesh.   SURGEON:  Sandria Bales. Ezzard Standing, MD   FIRST ASSISTANT:  None.   ANESTHESIA:  General endotracheal.   ESTIMATED BLOOD LOSS:  Minimal.   INDICATIONS FOR PROCEDURE:  [name] is a 77 year old [place] male, who had a  colon cancer resected about two months ago.  He had known bilateral inguinal  hernias.  Unfortunately, in the office one day, he came in with a hernia  incarcerated, but I was able to reduce the hernia on his left side and  despite getting chemotherapy, I thought he would be best served by  proceeding with repair of both of these hernias.  I talked with him about  both laparoscopic and open hernias,  discussed both the indications and  potential complications including but not limited to infection, bleeding,  nerve injury, and recurrent hernias.   DESCRIPTION OF OPERATION:  The patient placed in a supine position, given a  general anesthetic.  He had 1 g of Ancef at the initiation of the procedure,  a Foley catheter in place.  His lower abdomen was shave and prepped with  Betadine solution and sterilely draped.   An infraumbilical incision was made with sharp dissection carried to the  left of midline since this was the side where the hernia tried on  incarcerate.  I went through his anterior rectus fascia, retracted his  muscle anteriorly but did not go through  his posterior fascia and passed the  PDB balloon into the preperitoneal space.   I insufflated a ballon under direct laparoscopic visualization,  demonstrating the muscle anteriorly, the fat posteriorly.   I then placed two additional 5 mm trocars in both lower quadrants and  visualized his preperitoneal space.  On the right side, I was able to find  and isolate an indirect inguinal hernia which went up into the ring.  I  actually pulled the hernia down, placed an Endoloop around the hernia sac.  On the left side, I did the same thing, although this hernia sac was more  scarred in, I think in part because the incarcerating part was somewhat  larger.  Again, I pulled this down from the internal ring and put an  Endoloop around this.   At this time, I started my  inguinal hernia repairs using the precut Atrium  mesh, placed this into the preperitoneal space, tacked it medially to the  pubic symphysis inferiorly to Cooper's ligament, superiorly to transversalis  fascia.  I wrapped this mesh around the cord structures and tacked the tail  down to the inguinal floor, and then I put some tacks out laterally.   I then repeat measured on the right side, began tacking to the pubic  symphysis medially, Cooper's ligament inferiorly, transversalis fascia  superiorly.  On both sides, I avoided placing tacks lateral to the cord  structures and inferior to the ileopubic tract.  I used a total of 12 tacks  on the left side, put 15 tacks on the right side.   At this point, the mesh lay flat.  There was no bleeding of any consequence.  I removed both trocars without trouble.  I then removed the Hasson trocar,  closed the defect in the fascia with 0 Vicryl suture and closed the skin at  each site with a 5-0 Monocryl followed by Vicryl, painted the wounds with  tincture of Benzoin and Steri-Strips.   The patient tolerated the procedure well and was transported to the recovery  room in good condition.   Because he has bilateral inguinal hernias and was a  recent colon cancer patient, a little trouble with the [place] language, I  will plan to keep him overnight for observation with plans to discharge  tomorrow.                                               Sandria Bales. Ezzard Standing, M.D.    DHN/MEDQ  D:  08/18/2002  T:  08/18/2002  Job:  [id number]   cc:   [name], M.D.  [facility]   [name], M.D.  [address]- [facility]  [place]  [place]  [address]  Fax: [phone]

## 2010-10-07 NOTE — Op Note (Signed)
NAME:  [name], [name]                              ACCOUNT NO.:  [id number]   MEDICAL RECORD NO.:  [id number]                   PATIENT TYPE:  AMB   LOCATION:  DSC                                  FACILITY:  [facility]   PHYSICIAN:  Sandria Bales. Ezzard Standing, M.D.               DATE OF BIRTH:  21-Mar-1929   DATE OF PROCEDURE:  12/22/2003  DATE OF DISCHARGE:                                 OPERATIVE REPORT   PREOPERATIVE DIAGNOSIS:  History of colon cancer with a Port-A-Cath  placement.   POSTOPERATIVE DIAGNOSIS:  History of colon cancer with a Port-A-Cath  placement.   PROCEDURE:  Removal of Port-A-Cath.   SURGEON:  Sandria Bales. Ezzard Standing, M.D.   ANESTHESIA:  Xylocaine 1%, 8 mL.   COMPLICATIONS:  None at the end of the procedure.   INDICATIONS FOR PROCEDURE:  The patient is a 77 year old [name] who  had a right colon cancer resected by me in January  2004.  He has undergone  chemotherapy by Dr. Jillyn Hidden B. Sherrill and has done well with that.  He now  comes for a Port-A-Cath removal.   DESCRIPTION OF PROCEDURE:  The patient is placed in a supine position.  His  left shoulder is prepped with Betadine solution and sterilely draped.  I  used about 8 mL of 1% Xylocaine to infiltrate the skin.  I then cut down on  the Port-A-Cath and removed it intact.  The patient did not want to take  this home, so we threw it in the trash.  The wound was then closed with  running #5-0 Vicryl suture and painted with tincture of Benzoin and Steri-  Strips.   DISPOSITION:  I will see him back in two weeks for a wound check.                                               Sandria Bales. Ezzard Standing, M.D.    DHN/MEDQ  D:  12/22/2003  T:  12/22/2003  Job:  [id number]   cc:   Jillyn Hidden B. Truett Perna, M.D.  [address]- [facility]  [place]  [place] [address]  Fax: [phone]   Gates Rigg, M.D.

## 2010-11-08 ENCOUNTER — Telehealth: Payer: Self-pay | Admitting: Internal Medicine

## 2010-11-08 DIAGNOSIS — M8580 Other specified disorders of bone density and structure, unspecified site: Secondary | ICD-10-CM

## 2010-11-08 NOTE — Telephone Encounter (Signed)
Bone density test 07-28-08 show osteopenia. Plan: discontinue Fosamax, he will need a bone density test ordered that he come back for his routine visit; will decide then what is next

## 2010-11-08 NOTE — Telephone Encounter (Signed)
Pt is aware.  

## 2010-11-08 NOTE — Telephone Encounter (Signed)
Pt came in with his wife and said that he seen Dr. Leone Payor and he took him off of his fosmax. And he was told to ask Dr. Drue Novel since he doesn't take the fomax anymore what should he take now.

## 2010-11-09 ENCOUNTER — Ambulatory Visit (INDEPENDENT_AMBULATORY_CARE_PROVIDER_SITE_OTHER)
Admission: RE | Admit: 2010-11-09 | Discharge: 2010-11-09 | Disposition: A | Discharge status: Home / Self Care | Payer: Medicare Other | Source: Ambulatory Visit | Attending: Internal Medicine | Admitting: Internal Medicine

## 2010-11-09 DIAGNOSIS — M8580 Other specified disorders of bone density and structure, unspecified site: Secondary | ICD-10-CM

## 2010-11-09 DIAGNOSIS — M899 Disorder of bone, unspecified: Secondary | ICD-10-CM

## 2010-11-15 ENCOUNTER — Encounter: Payer: Self-pay | Admitting: Internal Medicine

## 2010-11-16 ENCOUNTER — Other Ambulatory Visit: Payer: Self-pay | Admitting: Internal Medicine

## 2010-11-16 NOTE — Telephone Encounter (Signed)
Medication refilled #30 with 3 refills.

## 2010-11-29 ENCOUNTER — Telehealth: Payer: Self-pay | Admitting: Internal Medicine

## 2010-11-29 NOTE — Telephone Encounter (Signed)
Pt has appt tomorrow

## 2010-11-29 NOTE — Telephone Encounter (Signed)
He is due for a routine office visit, please a schedule, we can discuss that at the time of the visit

## 2010-11-29 NOTE — Telephone Encounter (Signed)
Dr.Paz please advise  

## 2010-11-30 ENCOUNTER — Telehealth: Payer: Self-pay | Admitting: Internal Medicine

## 2010-11-30 ENCOUNTER — Ambulatory Visit (INDEPENDENT_AMBULATORY_CARE_PROVIDER_SITE_OTHER): Payer: Medicare Other | Admitting: Internal Medicine

## 2010-11-30 ENCOUNTER — Encounter: Payer: Self-pay | Admitting: Internal Medicine

## 2010-11-30 DIAGNOSIS — M899 Disorder of bone, unspecified: Secondary | ICD-10-CM

## 2010-11-30 DIAGNOSIS — M949 Disorder of cartilage, unspecified: Secondary | ICD-10-CM

## 2010-11-30 DIAGNOSIS — I10 Essential (primary) hypertension: Secondary | ICD-10-CM

## 2010-11-30 DIAGNOSIS — E039 Hypothyroidism, unspecified: Secondary | ICD-10-CM

## 2010-11-30 MED ORDER — SILDENAFIL CITRATE 100 MG PO TABS: 100.0000 mg | ORAL_TABLET | Freq: Every day | ORAL | Status: DC | PRN

## 2010-11-30 NOTE — Assessment & Plan Note (Signed)
Well controlled, continue same medicines. Labs

## 2010-11-30 NOTE — Assessment & Plan Note (Addendum)
Bone density test 07/2008, took  Fosamax until 07/2010, medication discontinued due to gastritis found on EGD. DEXA repeated 10-2010, Tscore - 1.8, slt better than before rec Reclast as biphosphonates are working

## 2010-11-30 NOTE — Assessment & Plan Note (Signed)
Good medication compliance, due for labs 

## 2010-11-30 NOTE — Progress Notes (Signed)
  Subjective:    Patient ID: [name], male    DOB: 1929-02-04, 77 y.o.   MRN: [id number]  HPI Routine office visit, last  office visit October 2011.  Osteopenia- GI recommended to discontinue Fosamax after they found gastritis on EGD. Patient wonders what is the next therapy available. GI issues, chart reviewed, had EGD last March that showed gastritis, H. pylori was negative. Hypertension-good medication compliance, good ambulatory blood pressure numbers. Hypothyroidism, good medication compliance. saw cardiology November 2011, was found to be stable   Past Medical History  Diagnosis Date  . Hypertension   . Hypothyroidism     Hypothyroid  . Erectile dysfunction   . Osteopenia   . Insomnia   . Eczema   . Gastric ulcer 12/11    H-Pylori Tx, EGD 3-12: gastritis  . Epistaxis     f/u per ENT  . Adenocarcinoma     colon s/p right hemicolectomy and chemotherapy. F/U with cancer center and Dr. Ezzard Standing rotinely  . Hay fever    Past Surgical History  Procedure Date  . Colonoscopy 2005, 2008    Dr. Ezzard Standing  . Inguinal hernia repair     Right  . Colon surgery     Right / Adenocarcinoma / Chemo    Review of Systems No chest pain or shortness of breath. No lower extremity edema At this point denies abdominal pain, blood in the stools, nausea or vomiting.     Objective:   Physical Exam  Constitutional: He appears well-developed and well-nourished. No distress.  HENT:  Head: Normocephalic and atraumatic.  Cardiovascular: Normal rate, regular rhythm and normal heart sounds.   No murmur heard. Pulmonary/Chest: Effort normal and breath sounds normal. No respiratory distress. He has no wheezes. He has no rales.  Abdominal: Soft. Bowel sounds are normal. He exhibits no distension. There is no tenderness. There is no rebound and no guarding.  Musculoskeletal: He exhibits no edema.  Skin: He is not diaphoretic.          Assessment & Plan:

## 2010-11-30 NOTE — Telephone Encounter (Signed)
Please  arranged a Reclast infusion. Labs pending.  DX---- osteopenia

## 2010-12-01 LAB — BASIC METABOLIC PANEL
BUN: 16 mg/dL (ref 6–23)
CO2: 26 mEq/L (ref 19–32)
Calcium: 9.5 mg/dL (ref 8.4–10.5)
Chloride: 100 mEq/L (ref 96–112)
Creatinine, Ser: 1.2 mg/dL (ref 0.4–1.5)
GFR: 60.1 mL/min (ref >60.00)
Glucose, Bld: 129 mg/dL — ABNORMAL HIGH (ref 70–99)
Potassium: 4 mEq/L (ref 3.5–5.1)
Sodium: 140 mEq/L (ref 135–145)

## 2010-12-01 LAB — TSH: TSH: 11.65 u[IU]/mL — ABNORMAL HIGH (ref 0.35–5.50)

## 2010-12-01 NOTE — Telephone Encounter (Signed)
Sent over forms for benefit verfication.

## 2010-12-02 ENCOUNTER — Telehealth: Payer: Self-pay | Admitting: *Deleted

## 2010-12-02 MED ORDER — LEVOTHYROXINE SODIUM 150 MCG PO TABS: 150.0000 ug | ORAL_TABLET | Freq: Every day | ORAL | Status: DC

## 2010-12-02 NOTE — Telephone Encounter (Signed)
Message copied by Leanne Lovely on Fri Dec 02, 2010  2:34 PM ------      Message from: Willow Ora E      Created: Fri Dec 02, 2010  1:58 PM       Advise patient,       Thyroid not well controlled.      Ask again about compliance. He has been taking Synthroid 100 mcg every day as prescribed, then increase the Synthroid to 150 mcg daily.      If he has not been taking Synthroid regularly, then advised to take 100 mcg every day.      TSH in 6 weeks

## 2010-12-02 NOTE — Telephone Encounter (Signed)
Pt states he is taking synthroid 100 mcg daily. Advised pt we would increase his rx.

## 2010-12-08 ENCOUNTER — Telehealth: Payer: Self-pay | Admitting: *Deleted

## 2010-12-08 NOTE — Telephone Encounter (Signed)
Spoke w/ Reclast company pts insurance does not cover Reclast. For the drug itself it cost roughly 1750, does not include admin fee. Please advise if there is another option for this pt.

## 2010-12-09 NOTE — Telephone Encounter (Signed)
Let me discuss w/ GI, will call him back in few days

## 2010-12-09 NOTE — Telephone Encounter (Signed)
Noted  

## 2010-12-14 MED ORDER — ALENDRONATE SODIUM 70 MG PO TABS: 70.0000 mg | ORAL_TABLET | ORAL | Status: DC

## 2010-12-14 NOTE — Telephone Encounter (Signed)
Case discussed with GI, would be okay to restart Fosamax cautiously. Plan:  Fosamax 70 mg 1 by mouth weekly, call #4, 1 RF. Fosamax precautions as usual F/u  here in 6 weeks to be sure he's not having problems

## 2010-12-14 NOTE — Telephone Encounter (Signed)
Addended by: Arnette Norris on: 12/14/2010 02:00 PM   Modules accepted: Orders

## 2010-12-14 NOTE — Telephone Encounter (Signed)
Discussed with patient and he voiced understanding-- Rx printed and given to him in the office     KP

## 2011-01-13 ENCOUNTER — Other Ambulatory Visit: Payer: Self-pay | Admitting: Internal Medicine

## 2011-01-13 NOTE — Telephone Encounter (Signed)
Rx given verbal via telephone after verifying that pharmacy did not have refill on file from 12/14/10 prescription.

## 2011-01-25 ENCOUNTER — Ambulatory Visit (INDEPENDENT_AMBULATORY_CARE_PROVIDER_SITE_OTHER): Payer: Medicare Other | Admitting: Internal Medicine

## 2011-01-25 ENCOUNTER — Encounter: Payer: Self-pay | Admitting: Internal Medicine

## 2011-01-25 DIAGNOSIS — M949 Disorder of cartilage, unspecified: Secondary | ICD-10-CM

## 2011-01-25 DIAGNOSIS — M8580 Other specified disorders of bone density and structure, unspecified site: Secondary | ICD-10-CM

## 2011-01-25 DIAGNOSIS — E039 Hypothyroidism, unspecified: Secondary | ICD-10-CM

## 2011-01-25 DIAGNOSIS — M899 Disorder of bone, unspecified: Secondary | ICD-10-CM

## 2011-01-25 LAB — TSH: TSH: 0.1 u[IU]/mL — ABNORMAL LOW (ref 0.35–5.50)

## 2011-01-25 MED ORDER — RISEDRONATE SODIUM 35 MG PO TBEC: 35.0000 mg | DELAYED_RELEASE_TABLET | ORAL | Status: DC

## 2011-01-25 MED ORDER — AMLEXANOX 5 % MT PSTE: PASTE | OROMUCOSAL | Status: DC

## 2011-01-25 NOTE — Patient Instructions (Addendum)
Stop Fosamax Atelvia once a week with food, if it causes stomach pain then stop it  And call me  otherwise fill the prescription aphthasol to tongue twice a day, call if no better

## 2011-01-25 NOTE — Assessment & Plan Note (Signed)
Continue with GI side effects from Fosamax. I gave the patient 2 samples of Atelvia 35 mg one by mouth every week, okay to take with food. If he cannot tolerate he will let me know otherwise will fill a prescription

## 2011-01-25 NOTE — Assessment & Plan Note (Signed)
Last TSH elevated while taking Synthroid 100 mcg, now on 150 mcg. Labs

## 2011-01-25 NOTE — Progress Notes (Signed)
  Subjective:    Patient ID: [name], male    DOB: 1928/08/15, 77 y.o.   MRN: [id number]  HPI  Followup  Past Medical History  Diagnosis Date  . Hypertension   . Hypothyroidism     Hypothyroid  . Erectile dysfunction   . Osteopenia   . Insomnia   . Eczema   . Gastric ulcer 12/11    H-Pylori Tx, EGD 3-12: gastritis  . Epistaxis     f/u per ENT  . Adenocarcinoma     colon s/p right hemicolectomy and chemotherapy. F/U with cancer center and Dr. Ezzard Standing rotinely  . Hay fever    Past Surgical History  Procedure Date  . Colonoscopy 2005, 2008    Dr. Ezzard Standing  . Inguinal hernia repair     Right  . Colon surgery     Right / Adenocarcinoma / Chemo    Review of Systems In general doing well, he increased his dose of Synthroid to 150 mcg, good compliance according to the patient. He has tried Fosamax since the last office visit, he has consistently develop an abdominal-upper-left side pain for few hours after Fosamax. Denies any nausea, vomiting, diarrhea blood in the stools. Developed a lesion on the tongue  a week ago, it is hurting. Patient wonders if his medication related.    Objective:   Physical Exam  Constitutional: He appears well-developed and well-nourished.  HENT:       At the inferior aspect of the tongue, left side he has a 2 mm skin tag-like, white lesion with redness around it. Tender to touch. Palpation of the rest of the mouth seems normal  Neck:       No lymphadenopathy   Cardiovascular: Normal rate, regular rhythm and normal heart sounds.   No murmur heard. Pulmonary/Chest: Effort normal and breath sounds normal. No respiratory distress. He has no wheezes. He has no rales.          Assessment & Plan:  Mouth lesion: Acute mouth lesion, trial w/ Aphthasol, if not better he will let me know

## 2011-01-26 ENCOUNTER — Telehealth: Payer: Self-pay

## 2011-01-26 NOTE — Telephone Encounter (Signed)
No alternative that I know of,  do they have any suggestions ?

## 2011-01-26 NOTE — Telephone Encounter (Signed)
Pharmacy called triage line and indicated patient needs an alternative medication, Dr.Paz please advise and forward to [facility]

## 2011-01-30 ENCOUNTER — Telehealth: Payer: Self-pay | Admitting: *Deleted

## 2011-01-30 DIAGNOSIS — R7989 Other specified abnormal findings of blood chemistry: Secondary | ICD-10-CM

## 2011-01-30 DIAGNOSIS — E039 Hypothyroidism, unspecified: Secondary | ICD-10-CM

## 2011-01-30 MED ORDER — LEVOTHYROXINE SODIUM 137 MCG PO TABS: 137.0000 ug | ORAL_TABLET | Freq: Every day | ORAL | Status: DC

## 2011-01-30 NOTE — Telephone Encounter (Signed)
Patient Informed. New Rx to [facility] pharmacy. Order for future TSH placed. Will mail results.

## 2011-01-31 NOTE — Telephone Encounter (Signed)
No alternative that I know of, do the pharmacist have any suggestions ?

## 2011-02-02 MED ORDER — TRIAMCINOLONE ACETONIDE 0.1 % MT PSTE: PASTE | Freq: Two times a day (BID) | OROMUCOSAL | Status: AC

## 2011-02-02 NOTE — Telephone Encounter (Signed)
Done

## 2011-02-02 NOTE — Telephone Encounter (Signed)
Called and spoke with the pharmacy and was informed the only other Oral paste is Kenalog Ora base. Dr.Paz please advise if you would like for patient to have this as an alternative and forward to [employer]

## 2011-02-02 NOTE — Telephone Encounter (Signed)
Yes , use the  alternative, twice a day for one week. Call 1 tube, no refills

## 2011-02-21 ENCOUNTER — Ambulatory Visit (INDEPENDENT_AMBULATORY_CARE_PROVIDER_SITE_OTHER): Payer: Medicare Other | Admitting: *Deleted

## 2011-02-21 VITALS — Temp 98.3°F

## 2011-02-21 DIAGNOSIS — Z23 Encounter for immunization: Secondary | ICD-10-CM

## 2011-02-24 ENCOUNTER — Other Ambulatory Visit: Payer: Self-pay | Admitting: Internal Medicine

## 2011-03-01 ENCOUNTER — Telehealth: Payer: Self-pay | Admitting: Internal Medicine

## 2011-03-02 NOTE — Telephone Encounter (Signed)
Patient Information forwarded to Selena Batten to initiate PA for Prolia.

## 2011-03-02 NOTE — Telephone Encounter (Signed)
Intolerant to oral biphosphonate, insurance has denied reclast. One good option would be prolia 1 shot every 6 months. Initiate the request or prior authorization Selena Batten, Dr Laury Axon nurse, knows how to do it). We'll see how much it cost out of pocket for him and if he can afford it

## 2011-03-07 NOTE — Telephone Encounter (Signed)
Prolia PA sent & confirmation received. Awaiting approval & out-of-pocket cost.

## 2011-03-15 NOTE — Telephone Encounter (Signed)
Approval received. $205.00 out of pocket cost. [$40 co-pay and $165 co-insurance] Patient informed and also that Prolia will have to be pre-approved prior to each injection. Selena Batten will add [1] for patient to Prolia order forwarded to [facility]. Will call Pt to schedule appt when product arrives.

## 2011-03-19 ENCOUNTER — Other Ambulatory Visit: Payer: Self-pay | Admitting: Internal Medicine

## 2011-04-11 ENCOUNTER — Ambulatory Visit (INDEPENDENT_AMBULATORY_CARE_PROVIDER_SITE_OTHER): Payer: Medicare Other | Admitting: Internal Medicine

## 2011-04-11 ENCOUNTER — Encounter: Payer: Self-pay | Admitting: Internal Medicine

## 2011-04-11 DIAGNOSIS — Z125 Encounter for screening for malignant neoplasm of prostate: Secondary | ICD-10-CM

## 2011-04-11 DIAGNOSIS — M949 Disorder of cartilage, unspecified: Secondary | ICD-10-CM

## 2011-04-11 DIAGNOSIS — Z0000 Encounter for general adult medical examination without abnormal findings: Secondary | ICD-10-CM | POA: Insufficient documentation

## 2011-04-11 DIAGNOSIS — E781 Pure hyperglyceridemia: Secondary | ICD-10-CM

## 2011-04-11 DIAGNOSIS — I10 Essential (primary) hypertension: Secondary | ICD-10-CM

## 2011-04-11 DIAGNOSIS — R748 Abnormal levels of other serum enzymes: Secondary | ICD-10-CM

## 2011-04-11 DIAGNOSIS — R498 Other voice and resonance disorders: Secondary | ICD-10-CM

## 2011-04-11 DIAGNOSIS — M899 Disorder of bone, unspecified: Secondary | ICD-10-CM

## 2011-04-11 DIAGNOSIS — E039 Hypothyroidism, unspecified: Secondary | ICD-10-CM

## 2011-04-11 LAB — CBC WITH DIFFERENTIAL/PLATELET
Basophils Absolute: 0 10*3/uL (ref 0.0–0.1)
Basophils Relative: 0.4 % (ref 0.0–3.0)
Eosinophils Absolute: 0.1 10*3/uL (ref 0.0–0.7)
Eosinophils Relative: 1 % (ref 0.0–5.0)
HCT: 39.3 % (ref 39.0–52.0)
Hemoglobin: 13.2 g/dL (ref 13.0–17.0)
Lymphocytes Relative: 23.8 % (ref 12.0–46.0)
Lymphs Abs: 1.7 10*3/uL (ref 0.7–4.0)
MCHC: 33.5 g/dL (ref 30.0–36.0)
MCV: 87.6 fl (ref 78.0–100.0)
Monocytes Absolute: 0.7 10*3/uL (ref 0.1–1.0)
Monocytes Relative: 9.8 % (ref 3.0–12.0)
Neutro Abs: 4.6 10*3/uL (ref 1.4–7.7)
Neutrophils Relative %: 65 % (ref 43.0–77.0)
Platelets: 216 10*3/uL (ref 150.0–400.0)
RBC: 4.49 Mil/uL (ref 4.22–5.81)
RDW: 12.9 % (ref 11.5–14.6)
WBC: 7 10*3/uL (ref 4.5–10.5)

## 2011-04-11 LAB — LIPID PANEL
Cholesterol: 140 mg/dL (ref 0–200)
HDL: 31.3 mg/dL — ABNORMAL LOW (ref >39.00)
Total CHOL/HDL Ratio: 4
Triglycerides: 209 mg/dL — ABNORMAL HIGH (ref 0.0–149.0)
VLDL: 41.8 mg/dL — ABNORMAL HIGH (ref 0.0–40.0)

## 2011-04-11 LAB — BASIC METABOLIC PANEL
BUN: 14 mg/dL (ref 6–23)
CO2: 29 mEq/L (ref 19–32)
Calcium: 9.1 mg/dL (ref 8.4–10.5)
Chloride: 105 mEq/L (ref 96–112)
Creatinine, Ser: 1 mg/dL (ref 0.4–1.5)
GFR: 78.98 mL/min (ref >60.00)
Glucose, Bld: 88 mg/dL (ref 70–99)
Potassium: 4.4 mEq/L (ref 3.5–5.1)
Sodium: 141 mEq/L (ref 135–145)

## 2011-04-11 LAB — LIPASE: Lipase: 21 U/L (ref 11.0–59.0)

## 2011-04-11 LAB — PSA: PSA: 3.76 ng/mL (ref 0.10–4.00)

## 2011-04-11 LAB — AMYLASE: Amylase: 111 U/L (ref 27–131)

## 2011-04-11 LAB — TSH: TSH: 0.04 u[IU]/mL — ABNORMAL LOW (ref 0.35–5.50)

## 2011-04-11 NOTE — Patient Instructions (Signed)
Please make an appointment to see Dr. Ezzard Standing for colon cancer followup Please do every effort to take Atelvia Please bring a translator with you for [facility] appointment

## 2011-04-11 NOTE — Assessment & Plan Note (Signed)
Well controlled, labs  

## 2011-04-11 NOTE — Assessment & Plan Note (Addendum)
Bone density test 07/2008 T score -1.9 Took Fosamax until 07/2010, medication discontinued due to gastritis found on EGD. DEXA 6-12: Tscore -1.8 Insurance denied reclast ~ 11-2010 Try Fosamax again a-2012, had abdominal pain, Fosamax discontinued Has not use atelvia due to cost, his options are limited, recommended  to look into atelvia again

## 2011-04-11 NOTE — Assessment & Plan Note (Signed)
Labs

## 2011-04-11 NOTE — Progress Notes (Signed)
  Subjective:    Patient ID: [name], male    DOB: 1928/08/29, 77 y.o.   MRN: [id number]  HPI Here for Medicare AWV: 1. Risk factors based on Past M, S, F history: reviewed 2. Physical Activities: active, no routine  3. Depression/mood:  No problems noted or reported 4. Hearing: no problems reported   5. ADL's: independent , still drives    6. Fall Risk: no h/o falls , counseled  7. home Safety: does feel safe at home  8. Height, weight, &visual acuity: see VS, glasses needs a new Rx, plans to see the optometrist soon 9. Counseling: provided 10. Labs ordered based on risk factors: if needed  11. Referral Coordination: if needed 12.  Care Plan, see assessment and plan  13.   Cognitive Assessment: motor skills and cognition seem appropriate   In addition, today we discussed the following: Hypertension--good medication compliance, ambulatory BP is 130/70 Osteopenia-- has not tried atelvia, would like a generic Thyroid disease--good medication compliance History of colon cancer//note from surgery/2011 reviewed.  Past Medical History  Diagnosis Date  . Hypertension   . Hypothyroidism     Hypothyroid  . Erectile dysfunction   . Osteopenia      per DEXA 07-2008 (Rx fosamax)  . Insomnia   . Eczema   . Gastric ulcer 12/11    H-Pylori Tx, EGD 3-12: gastritis  . Epistaxis     f/u per ENT  . Adenocarcinoma     colon s/p right hemicolectomy and chemotherapy. F/U with cancer center and Dr. Ezzard Standing rotinely  . Hay fever    Past Surgical History  Procedure Date  . Colonoscopy 2005, 2008    Dr. Ezzard Standing  . Inguinal hernia repair     Right  . Colon surgery     Right / Adenocarcinoma / Chemo   Family History  Problem Relation Age of Onset  . Diabetes Neg Hx   . Heart attack Neg Hx   . Colon cancer Neg Hx   . Prostate cancer Neg Hx     Social History: original from [place] veggetarian Married, two children tobacco--never ETOH--no   Review of Systems No chest pain or  shortness of breath No nausea, vomiting, diarrhea blood in the stools No difficulty urinating or blood in the urine.     Objective:   Physical Exam  Constitutional: He appears well-developed and well-nourished.  HENT:  Head: Normocephalic and atraumatic.  Neck: No thyromegaly present.  Cardiovascular: Normal rate, regular rhythm and normal heart sounds.   No murmur heard. Pulmonary/Chest: Effort normal and breath sounds normal. No respiratory distress. He has no wheezes. He has no rales.  Abdominal: Soft. Bowel sounds are normal. He exhibits no distension. There is no tenderness. There is no rebound.  Genitourinary: Rectum normal. Guaiac negative stool.       Prostate is slightly enlarged, no nodular or tender  Neurological: He is alert.  Psychiatric: He has a normal mood and affect. His behavior is normal.       Hard to communicate with the patient due to a language barrier      Assessment & Plan:

## 2011-04-11 NOTE — Assessment & Plan Note (Addendum)
Td 2011 Pneumonia shot 2010 Had a flu shot zostavax , information provided  Cscope---- Dr Ezzard Standing, surgery:  2005 and 2008 Diet and exercise discussed Slight increase prostate size, r/o elevated PSA Also, there is a language barrier, I asked the pt to bring a translator with him (in the past he has been reluctant)

## 2011-04-11 NOTE — Assessment & Plan Note (Signed)
On diet only, labs 

## 2011-04-12 LAB — LDL CHOLESTEROL, DIRECT: Direct LDL: 74.9 mg/dL

## 2011-04-13 NOTE — Assessment & Plan Note (Signed)
ENT eval 2010, R VC paresis? Pt was reassured

## 2011-04-24 ENCOUNTER — Telehealth: Payer: Self-pay

## 2011-04-24 ENCOUNTER — Other Ambulatory Visit: Payer: Self-pay

## 2011-04-24 DIAGNOSIS — M899 Disorder of bone, unspecified: Secondary | ICD-10-CM

## 2011-04-24 DIAGNOSIS — E039 Hypothyroidism, unspecified: Secondary | ICD-10-CM

## 2011-04-24 MED ORDER — RISEDRONATE SODIUM 35 MG PO TBEC: 35.0000 mg | DELAYED_RELEASE_TABLET | ORAL | Status: DC

## 2011-04-24 MED ORDER — LEVOTHYROXINE SODIUM 112 MCG PO TABS: 112.0000 ug | ORAL_TABLET | Freq: Every day | ORAL | Status: DC

## 2011-04-24 NOTE — Telephone Encounter (Signed)
Future lab order placed.  

## 2011-04-24 NOTE — Telephone Encounter (Signed)
Message copied by Beverely Low on Mon Apr 24, 2011  8:58 AM ------      Message from: [name]      Created: Fri Apr 14, 2011 12:02 PM       Advise patient:      Needs less synthroid, decrease to synthroid 112 mcg 1 po qd, call new Rx      Arrange TSH in 6 weeks      Other labs wnl, cholesterol better than before

## 2011-05-12 ENCOUNTER — Other Ambulatory Visit: Payer: Self-pay | Admitting: Internal Medicine

## 2011-06-02 ENCOUNTER — Telehealth: Payer: Self-pay | Admitting: Internal Medicine

## 2011-06-02 ENCOUNTER — Ambulatory Visit (INDEPENDENT_AMBULATORY_CARE_PROVIDER_SITE_OTHER): Payer: Medicare Other | Admitting: Internal Medicine

## 2011-06-02 ENCOUNTER — Encounter: Payer: Self-pay | Admitting: Internal Medicine

## 2011-06-02 VITALS — BP 145/68 | HR 61 | Ht 63.0 in | Wt 124.0 lb

## 2011-06-02 DIAGNOSIS — R079 Chest pain, unspecified: Secondary | ICD-10-CM | POA: Insufficient documentation

## 2011-06-02 DIAGNOSIS — I059 Rheumatic mitral valve disease, unspecified: Secondary | ICD-10-CM

## 2011-06-02 NOTE — Assessment & Plan Note (Signed)
Improved.  LDL is excellent  Pt is a vegetarian.

## 2011-06-02 NOTE — Progress Notes (Signed)
HPI Patient is an 70 yar old with a nistory of HTN, dyslipidemia, hypothyroidism and CP  Normal myoview in 2010.  I saw him last in clinic in NOvember of 2011.  Last LDL in November was 75. Since seen he has had a few episodes of L sided CP.  Not effected by activity Staying active.     No Known Allergies  Current Outpatient Prescriptions  Medication Sig Dispense Refill  . amlexanox (APHTHASOL) 5 % paste Applied twice a day to the base of the tongue  for five-day  5 g  0  . aspirin 81 MG tablet Take 81 mg by mouth daily.        . calcium-vitamin D (OSCAL WITH D) 500-200 MG-UNIT per tablet Take 1 tablet by mouth daily.        . cetirizine (ZYRTEC) 10 MG tablet Take 10 mg by mouth as needed.        . fluticasone (FLONASE) 50 MCG/ACT nasal spray 2 sprays by Nasal route daily.        Marland Kitchen levothyroxine (SYNTHROID) 112 MCG tablet Take 1 tablet (112 mcg total) by mouth daily.  30 tablet  3  . levothyroxine (SYNTHROID, LEVOTHROID) 112 MCG tablet Take 112 mcg by mouth daily.      Marland Kitchen losartan-hydrochlorothiazide (HYZAAR) 50-12.5 MG per tablet TAKE ONE TABLET BY MOUTH ONE TIME DAILY  30 tablet  5  . multivitamin (THERAGRAN) per tablet Take 1 tablet by mouth daily.        . Omega-3 Fatty Acids (OMEGA 3 PO) Take 1 tablet by mouth daily.        Marland Kitchen omeprazole (PRILOSEC) 40 MG capsule TAKE ONE CAPSULE BY MOUTH EVERY DAY  30 capsule  5  . sildenafil (VIAGRA) 100 MG tablet Take 1 tablet (100 mg total) by mouth daily as needed.  20 tablet  0  . triamcinolone (KENALOG) 0.1 % paste Place onto teeth 2 (two) times daily.  5 g  0    Past Medical History  Diagnosis Date  . Hypertension   . Hypothyroidism     Hypothyroid  . Erectile dysfunction   . Osteopenia      per DEXA 07-2008 (Rx fosamax)  . Insomnia   . Eczema   . Gastric ulcer 12/11    H-Pylori Tx, EGD 3-12: gastritis  . Epistaxis     f/u per ENT  . Adenocarcinoma     colon s/p right hemicolectomy and chemotherapy. F/U with cancer center and Dr.  Ezzard Standing rotinely  . Hay fever     Past Surgical History  Procedure Date  . Colonoscopy 2005, 2008    Dr. Ezzard Standing  . Inguinal hernia repair     Right  . Colon surgery     Right / Adenocarcinoma / Chemo    Family History  Problem Relation Age of Onset  . Diabetes Neg Hx   . Heart attack Neg Hx   . Colon cancer Neg Hx   . Prostate cancer Neg Hx     History   Social History  . Marital Status: Married    Spouse Name: N/A    Number of Children: 2  . Years of Education: N/A   Occupational History  .     Social History Main Topics  . Smoking status: Never Smoker   . Smokeless tobacco: Not on file  . Alcohol Use: No  . Drug Use: No  . Sexually Active: Not on file   Other Topics Concern  .  Not on file   Social History Narrative   [place]    Review of Systems:  All systems reviewed.  They are negative to the above problem except as previously stated.  Vital Signs: BP 145/68  Pulse 61  Ht 5\' 3"  (1.6 m)  Wt 124 lb (56.246 kg)  BMI 21.97 kg/m2  Physical Exam Patient is in NAD  HEENT:  Normocephalic, atraumatic. EOMI, PERRLA.  Neck: JVP is normal. No thyromegaly. No bruits.  Lungs: clear to auscultation. No rales no wheezes.  Heart: Regular rate and rhythm. Normal S1, S2. No S3.   No significant murmurs. PMI not displaced.  Abdomen:  Supple, nontender. Normal bowel sounds. No masses. No hepatomegaly.  Extremities:   Good distal pulses throughout. No lower extremity edema.  Musculoskeletal :moving all extremities.  Neuro:   alert and oriented x3.  CN II-XII grossly intact.  EKG:  Sinus bradycardia  58 bpm.   Assessment and Plan:

## 2011-06-02 NOTE — Assessment & Plan Note (Signed)
Slightly increased today.  Better at Dr. Drue Novel visit.  I will not change.

## 2011-06-02 NOTE — Assessment & Plan Note (Signed)
Thyroid supplement being adjusted.

## 2011-06-02 NOTE — Assessment & Plan Note (Signed)
Infrequent.  Atypical.  Follow.

## 2011-06-02 NOTE — Patient Instructions (Signed)
Your physician wants you to follow-up in:  12 months.  You will receive a reminder letter in the mail two months in advance. If you don't receive a letter, please call our office to schedule the follow-up appointment.   

## 2011-06-05 ENCOUNTER — Other Ambulatory Visit: Payer: Medicare Other

## 2011-06-09 ENCOUNTER — Other Ambulatory Visit: Payer: Self-pay | Admitting: Internal Medicine

## 2011-06-09 DIAGNOSIS — E039 Hypothyroidism, unspecified: Secondary | ICD-10-CM

## 2011-06-09 NOTE — Telephone Encounter (Signed)
Spoke with Pt and advise him that we are unable to find letter and would like for him to provide another copy. Pt will bring letter by the office at his convenience.

## 2011-06-09 NOTE — Telephone Encounter (Signed)
Please f/u on this , I have not seen a paper related to fosamax

## 2011-06-12 ENCOUNTER — Telehealth: Payer: Self-pay | Admitting: Internal Medicine

## 2011-06-12 ENCOUNTER — Other Ambulatory Visit: Payer: Medicare Other

## 2011-06-12 DIAGNOSIS — E039 Hypothyroidism, unspecified: Secondary | ICD-10-CM

## 2011-06-12 DIAGNOSIS — R7989 Other specified abnormal findings of blood chemistry: Secondary | ICD-10-CM

## 2011-06-12 LAB — TSH: TSH: 0.07 u[IU]/mL — ABNORMAL LOW (ref 0.35–5.50)

## 2011-06-12 MED ORDER — OMEPRAZOLE 40 MG PO CPDR: DELAYED_RELEASE_CAPSULE | ORAL | Status: DC

## 2011-06-12 NOTE — Telephone Encounter (Signed)
1.  as far as atelvia : recommend observation for now, his options to treat osteopenia are limited. 2. Okay to refill   Prilosec if needed

## 2011-06-12 NOTE — Telephone Encounter (Signed)
Rx for Omeprazole to pharmacy. Patient informed; told we will call back when MD looks at paperwork left at office today.

## 2011-06-16 ENCOUNTER — Other Ambulatory Visit: Payer: Self-pay | Admitting: *Deleted

## 2011-06-16 MED ORDER — LEVOTHYROXINE SODIUM 100 MCG PO TABS: 100.0000 ug | ORAL_TABLET | Freq: Every day | ORAL | Status: DC

## 2011-07-21 ENCOUNTER — Other Ambulatory Visit: Payer: Self-pay | Admitting: Internal Medicine

## 2011-07-21 DIAGNOSIS — R7989 Other specified abnormal findings of blood chemistry: Secondary | ICD-10-CM

## 2011-07-21 DIAGNOSIS — E039 Hypothyroidism, unspecified: Secondary | ICD-10-CM

## 2011-07-24 ENCOUNTER — Telehealth: Payer: Self-pay | Admitting: Oncology

## 2011-07-24 ENCOUNTER — Other Ambulatory Visit: Payer: Medicare Other

## 2011-07-24 NOTE — Telephone Encounter (Signed)
gv pt appt for 4/1  °

## 2011-07-27 ENCOUNTER — Other Ambulatory Visit (INDEPENDENT_AMBULATORY_CARE_PROVIDER_SITE_OTHER): Payer: Medicare Other

## 2011-07-27 DIAGNOSIS — R7989 Other specified abnormal findings of blood chemistry: Secondary | ICD-10-CM

## 2011-07-27 DIAGNOSIS — E039 Hypothyroidism, unspecified: Secondary | ICD-10-CM

## 2011-07-27 DIAGNOSIS — R6889 Other general symptoms and signs: Secondary | ICD-10-CM

## 2011-07-27 LAB — TSH: TSH: 0.58 u[IU]/mL (ref 0.35–5.50)

## 2011-08-21 ENCOUNTER — Ambulatory Visit (HOSPITAL_BASED_OUTPATIENT_CLINIC_OR_DEPARTMENT_OTHER): Payer: Medicare Other | Admitting: Oncology

## 2011-08-21 ENCOUNTER — Other Ambulatory Visit: Payer: Medicare Other | Admitting: Lab

## 2011-08-21 ENCOUNTER — Telehealth: Payer: Self-pay | Admitting: Oncology

## 2011-08-21 VITALS — BP 145/65 | HR 75 | Temp 97.4°F | Ht 63.0 in | Wt 128.8 lb

## 2011-08-21 DIAGNOSIS — C189 Malignant neoplasm of colon, unspecified: Secondary | ICD-10-CM

## 2011-08-21 LAB — CEA: CEA: 1.4 ng/mL (ref 0.0–5.0)

## 2011-08-21 NOTE — Telephone Encounter (Signed)
appts made and printed for pt  °

## 2011-08-21 NOTE — Progress Notes (Signed)
OFFICE PROGRESS NOTE   INTERVAL HISTORY:   He returns as scheduled. He feels well. He reports a good appetite.  Objective:  Vital signs in last 24 hours:  Blood pressure 145/65, pulse 75, temperature 97.4 F (36.3 C), temperature source Oral, height 5\' 3"  (1.6 m), weight 128 lb 12.8 oz (58.423 kg).    HEENT: Neck without mass Lymphatics: No cervical, supraclavicular, axillary, or inguinal nodes Resp: Lungs clear bilaterally Cardio: Regular rate and rhythm GI: No hepatosplenomegaly, no mass Vascular: No leg edema    Medications: I have reviewed the patient's current medications.  Assessment/Plan:   [name] has a history of stage III colon cancer diagnosed in January 2004.  He remains in clinical remission.  He will return for an office visit and CEA in 1 year.  He last had a colonoscopy in April of 2011.     Lucile Shutters, MD  08/21/2011  3:08 PM

## 2011-08-24 ENCOUNTER — Telehealth: Payer: Self-pay | Admitting: *Deleted

## 2011-08-24 NOTE — Telephone Encounter (Signed)
Message copied by Wandalee Ferdinand on Thu Aug 24, 2011  3:32 PM ------      Message from: Thornton Papas B      Created: Mon Aug 21, 2011  8:54 PM       Please call patient, cea is normal, f/u as scheduled

## 2011-08-24 NOTE — Telephone Encounter (Signed)
Left VM to call office for results.

## 2011-08-25 ENCOUNTER — Telehealth: Payer: Self-pay | Admitting: *Deleted

## 2011-08-25 NOTE — Telephone Encounter (Signed)
Patient notified of CEA results 

## 2011-08-25 NOTE — Telephone Encounter (Signed)
Message copied by Wandalee Ferdinand on Fri Aug 25, 2011 10:48 AM ------      Message from: Thornton Papas B      Created: Mon Aug 21, 2011  8:54 PM       Please call patient, cea is normal, f/u as scheduled

## 2011-08-30 ENCOUNTER — Other Ambulatory Visit: Payer: Self-pay | Admitting: Internal Medicine

## 2011-08-30 NOTE — Telephone Encounter (Signed)
Refill done.  

## 2011-10-25 ENCOUNTER — Ambulatory Visit: Payer: Medicare Other | Admitting: Internal Medicine

## 2011-11-30 ENCOUNTER — Other Ambulatory Visit: Payer: Self-pay | Admitting: Internal Medicine

## 2011-11-30 NOTE — Telephone Encounter (Signed)
Refill done.  

## 2012-01-11 ENCOUNTER — Other Ambulatory Visit: Payer: Self-pay | Admitting: Internal Medicine

## 2012-01-11 NOTE — Telephone Encounter (Signed)
Refill done.  

## 2012-02-23 ENCOUNTER — Other Ambulatory Visit: Payer: Self-pay | Admitting: Internal Medicine

## 2012-02-23 NOTE — Telephone Encounter (Signed)
Refill done.  

## 2012-02-27 ENCOUNTER — Ambulatory Visit (INDEPENDENT_AMBULATORY_CARE_PROVIDER_SITE_OTHER): Payer: Medicare Other

## 2012-02-27 DIAGNOSIS — Z23 Encounter for immunization: Secondary | ICD-10-CM

## 2012-05-01 ENCOUNTER — Other Ambulatory Visit: Payer: Self-pay | Admitting: Internal Medicine

## 2012-05-01 NOTE — Telephone Encounter (Signed)
TSH 244.9 

## 2012-08-20 ENCOUNTER — Telehealth: Payer: Self-pay | Admitting: Oncology

## 2012-08-20 ENCOUNTER — Ambulatory Visit (HOSPITAL_BASED_OUTPATIENT_CLINIC_OR_DEPARTMENT_OTHER): Payer: Medicare Other | Admitting: Oncology

## 2012-08-20 ENCOUNTER — Other Ambulatory Visit (HOSPITAL_BASED_OUTPATIENT_CLINIC_OR_DEPARTMENT_OTHER): Payer: Medicare Other

## 2012-08-20 VITALS — BP 121/59 | HR 61 | Temp 97.1°F | Resp 18 | Ht 63.0 in | Wt 124.8 lb

## 2012-08-20 DIAGNOSIS — Z85038 Personal history of other malignant neoplasm of large intestine: Secondary | ICD-10-CM

## 2012-08-20 DIAGNOSIS — C189 Malignant neoplasm of colon, unspecified: Secondary | ICD-10-CM

## 2012-08-20 DIAGNOSIS — L299 Pruritus, unspecified: Secondary | ICD-10-CM

## 2012-08-20 DIAGNOSIS — R21 Rash and other nonspecific skin eruption: Secondary | ICD-10-CM

## 2012-08-20 LAB — CEA: CEA: 2 ng/mL (ref 0.0–5.0)

## 2012-08-20 NOTE — Progress Notes (Signed)
   [facility]    OFFICE PROGRESS NOTE   INTERVAL HISTORY:   He returns as scheduled. He feels well. He has occasional discomfort in the left lateral abdomen after eating. No consistent pain. No difficulty with bowel function. No bleeding. He has a "rash "and pruritus at the shoulders. The rash has not improved with hydrocortisone cream.  Objective:  Vital signs in last 24 hours:  Blood pressure 121/59, pulse 61, temperature 97.1 F (36.2 C), temperature source Oral, resp. rate 18, height 5\' 3"  (1.6 m), weight 124 lb 12.8 oz (56.609 kg).    HEENT: Neck without mass Lymphatics: No cervical, supra-clavicular, axillary, or inguinal nodes Resp: Lungs clear bilaterally Cardio: Regular rate and rhythm GI: No hepatosplenomegaly, no mass, mild tenderness in the left lower abdomen Vascular: No leg edema  Skin: There are areas of plaque-like hyperpigmentation at the upper back and low neck.    Lab Results:  CEA pending   Medications: I have reviewed the patient's current medications.  Assessment/Plan: Mr Seymore has a history of stage III colon cancer diagnosed in January 2004. He remains in clinical remission. He will return for an office visit and CEA in 1 year. He last had a colonoscopy in April of 2011. He will continue colonoscopy followup with Dr. Ezzard Standing or Dr. Leone Payor.  He appears to have an eczema type rash at the upper back/low neck. I recommended he followup with a dermatologist.     Thornton Papas, MD  08/20/2012  12:18 PM

## 2012-08-20 NOTE — Telephone Encounter (Signed)
GAve pt appt for next year April 2015 lab and MD

## 2012-08-21 ENCOUNTER — Telehealth: Payer: Self-pay | Admitting: *Deleted

## 2012-08-21 NOTE — Telephone Encounter (Signed)
Patient notified via home VM that CEA normal and mailed hardcopy to home as he had requested.

## 2012-08-21 NOTE — Telephone Encounter (Signed)
Message copied by Wandalee Ferdinand on Wed Aug 21, 2012  2:09 PM ------      Message from: Ladene Artist      Created: Tue Aug 20, 2012  9:54 PM       Please call patient, cea is normal, f/u as scheduled ------

## 2012-12-05 ENCOUNTER — Ambulatory Visit: Payer: Medicare Other | Admitting: Family Medicine

## 2013-08-15 ENCOUNTER — Other Ambulatory Visit: Payer: Medicare Other

## 2013-08-21 ENCOUNTER — Ambulatory Visit (HOSPITAL_BASED_OUTPATIENT_CLINIC_OR_DEPARTMENT_OTHER): Payer: Medicare Other | Admitting: Oncology

## 2013-08-21 ENCOUNTER — Telehealth: Payer: Self-pay | Admitting: Oncology

## 2013-08-21 VITALS — BP 129/64 | HR 64 | Temp 97.5°F | Resp 18 | Ht 63.0 in | Wt 130.4 lb

## 2013-08-21 DIAGNOSIS — Z85038 Personal history of other malignant neoplasm of large intestine: Secondary | ICD-10-CM

## 2013-08-21 NOTE — Telephone Encounter (Signed)
gv adn printed appt sched and avs for pt for April 2016 °

## 2013-08-21 NOTE — Progress Notes (Signed)
  [place] OFFICE PROGRESS NOTE   Diagnosis: Colon cancer  INTERVAL HISTORY:   Mr. Hellard returns as scheduled. He feels well. He reports an episode of rectal bleeding last year. He reports undergoing a colonoscopy in [place] that was negative. He will be scheduled for a colonoscopy at a one-year interval. No recurrent bleeding.  Objective:  Vital signs in last 24 hours:  Blood pressure 129/64, pulse 64, temperature 97.5 F (36.4 C), temperature source Oral, resp. rate 18, height 5\' 3"  (1.6 m), weight 130 lb 6.4 oz (59.149 kg), SpO2 100.00%.    HEENT: Neck without mass Lymphatics: No cervical, supra-clavicular, axillary, or inguinal nodes Resp: Lungs clear bilaterally Cardio: Regular rate and rhythm GI: No hepatosplenomegaly, nontender, no mass Vascular: No leg edema   Lab Results:  Lab Results  Component Value Date   CEA 2.0 08/20/2012     Medications: I have reviewed the patient's current medications.  Assessment/Plan:  Mr. Eisenhour remains in clinical remission from colon cancer. He was diagnosed with stage III colon cancer in January 2004. He would like to continue followup at the [facility]. We decided against further CEA surveillance. He will continue colonoscopy followup with [facility] in [facility].  Disposition:  He will return for an office visit in one year.  Betsy Coder, MD  08/21/2013  3:00 PM

## 2014-02-20 ENCOUNTER — Ambulatory Visit (INDEPENDENT_AMBULATORY_CARE_PROVIDER_SITE_OTHER): Payer: Medicare Other

## 2014-02-20 DIAGNOSIS — Z23 Encounter for immunization: Secondary | ICD-10-CM

## 2014-08-27 ENCOUNTER — Telehealth: Payer: Self-pay | Admitting: Oncology

## 2014-08-27 ENCOUNTER — Ambulatory Visit: Payer: Self-pay | Admitting: Oncology

## 2014-08-27 NOTE — Telephone Encounter (Signed)
pt came in appt cx due to MD out of the office....r/s appt per pt request....printed new sched

## 2014-09-29 ENCOUNTER — Ambulatory Visit (HOSPITAL_BASED_OUTPATIENT_CLINIC_OR_DEPARTMENT_OTHER): Payer: Medicare Other | Admitting: Oncology

## 2014-09-29 ENCOUNTER — Ambulatory Visit: Payer: Self-pay | Admitting: Oncology

## 2014-09-29 ENCOUNTER — Telehealth: Payer: Self-pay | Admitting: Oncology

## 2014-09-29 VITALS — BP 143/58 | HR 86 | Temp 97.9°F | Resp 18 | Ht 63.0 in | Wt 129.7 lb

## 2014-09-29 DIAGNOSIS — C189 Malignant neoplasm of colon, unspecified: Secondary | ICD-10-CM

## 2014-09-29 DIAGNOSIS — Z85038 Personal history of other malignant neoplasm of large intestine: Secondary | ICD-10-CM

## 2014-09-29 NOTE — Progress Notes (Signed)
  [place] OFFICE PROGRESS NOTE   Diagnosis: Colon cancer  INTERVAL HISTORY:   Mr. Lohse returns as scheduled. He feels well. His only complaint is he has noted his neck returns to the left most of the time. No vision change or weakness. He reports undergoing a colonoscopy last year.  Objective:  Vital signs in last 24 hours:  Blood pressure 143/58, pulse 86, temperature 97.9 F (36.6 C), temperature source Oral, resp. rate 18, height 5\' 3"  (1.6 m), weight 129 lb 11.2 oz (58.832 kg), SpO2 97 %.    HEENT: Neck without mass Lymphatics: No cervical, supra-clavicular, axillary, or inguinal nodes Resp: Lungs are bilaterally Cardio: Regular rate and rhythm, 2/6 systolic murmur GI: No hepatosplenomegaly, nontender, no mass Vascular: No leg edema Neuro: Good strength with right and left turn of the neck, good arm strength bilaterally   Medications: I have reviewed the patient's current medications.  Assessment/Plan: Mr. Banner remains in clinical remission from colon cancer. He was diagnosed with stage III colon cancer in January 2004. He would like to continue followup at the [facility]. He will return for an office visit in one year.Marland Kitchen He will continue colonoscopy followup with [facility] in [facility].  I recommended he follow-up with his primary physician to evaluate the neck symptoms.  Betsy Coder, MD  09/29/2014  9:21 AM

## 2014-09-29 NOTE — Telephone Encounter (Signed)
Gave and printed appt sched and avs fo rpt for may 2017

## 2015-04-14 ENCOUNTER — Telehealth: Payer: Self-pay | Admitting: Internal Medicine

## 2015-04-14 NOTE — Telephone Encounter (Signed)
Pt states he has been having problems with abdominal pain. Requesting to be seen sooner than 1st available. Pt scheduled to see Lori Hvozdovic, PA-C 04/28/15@1 :30pm. Pt aware of appt.

## 2015-04-28 ENCOUNTER — Encounter: Payer: Self-pay | Admitting: Physician Assistant

## 2015-04-28 ENCOUNTER — Other Ambulatory Visit (INDEPENDENT_AMBULATORY_CARE_PROVIDER_SITE_OTHER): Payer: Medicare Other

## 2015-04-28 ENCOUNTER — Ambulatory Visit (INDEPENDENT_AMBULATORY_CARE_PROVIDER_SITE_OTHER): Payer: Medicare Other | Admitting: Physician Assistant

## 2015-04-28 VITALS — BP 128/60 | HR 72 | Ht 61.0 in | Wt 128.6 lb

## 2015-04-28 DIAGNOSIS — Z85038 Personal history of other malignant neoplasm of large intestine: Secondary | ICD-10-CM

## 2015-04-28 DIAGNOSIS — R1012 Left upper quadrant pain: Secondary | ICD-10-CM

## 2015-04-28 DIAGNOSIS — R110 Nausea: Secondary | ICD-10-CM

## 2015-04-28 LAB — CBC WITH DIFFERENTIAL/PLATELET
Basophils Absolute: 0 10*3/uL (ref 0.0–0.1)
Basophils Relative: 0.4 % (ref 0.0–3.0)
Eosinophils Absolute: 0.1 10*3/uL (ref 0.0–0.7)
Eosinophils Relative: 1.3 % (ref 0.0–5.0)
HCT: 40.8 % (ref 39.0–52.0)
Hemoglobin: 13.6 g/dL (ref 13.0–17.0)
Lymphocytes Relative: 42.9 % (ref 12.0–46.0)
Lymphs Abs: 3.3 10*3/uL (ref 0.7–4.0)
MCHC: 33.4 g/dL (ref 30.0–36.0)
MCV: 91.2 fl (ref 78.0–100.0)
Monocytes Absolute: 0.7 10*3/uL (ref 0.1–1.0)
Monocytes Relative: 8.7 % (ref 3.0–12.0)
Neutro Abs: 3.6 10*3/uL (ref 1.4–7.7)
Neutrophils Relative %: 46.7 % (ref 43.0–77.0)
Platelets: 215 10*3/uL (ref 150.0–400.0)
RBC: 4.47 Mil/uL (ref 4.22–5.81)
RDW: 15.3 % (ref 11.5–15.5)
WBC: 7.7 10*3/uL (ref 4.0–10.5)

## 2015-04-28 LAB — BASIC METABOLIC PANEL
BUN: 14 mg/dL (ref 6–23)
CO2: 32 mEq/L (ref 19–32)
Calcium: 9.2 mg/dL (ref 8.4–10.5)
Chloride: 103 mEq/L (ref 96–112)
Creatinine, Ser: 0.99 mg/dL (ref 0.40–1.50)
GFR: 76.38 mL/min (ref >60.00)
Glucose, Bld: 102 mg/dL — ABNORMAL HIGH (ref 70–99)
Potassium: 4.3 mEq/L (ref 3.5–5.1)
Sodium: 141 mEq/L (ref 135–145)

## 2015-04-28 LAB — HEPATIC FUNCTION PANEL
ALT: 21 U/L (ref 0–53)
AST: 24 U/L (ref 0–37)
Albumin: 3.6 g/dL (ref 3.5–5.2)
Alkaline Phosphatase: 54 U/L (ref 39–117)
Bilirubin, Direct: 0.1 mg/dL (ref 0.0–0.3)
Total Bilirubin: 0.3 mg/dL (ref 0.2–1.2)
Total Protein: 6.7 g/dL (ref 6.0–8.3)

## 2015-04-28 LAB — LIPASE: Lipase: 35 U/L (ref 11.0–59.0)

## 2015-04-28 NOTE — Patient Instructions (Signed)
You have been scheduled for a CT scan of the abdomen and pelvis at [facility] CT/ Radiology.   You are scheduled on 05-04-2015 at 1:30pm. You should arrive 15 minutes prior to your appointment time for registration. Please follow the written instructions below on the day of your exam:  WARNING: IF YOU ARE ALLERGIC TO IODINE/X-RAY DYE, PLEASE NOTIFY RADIOLOGY IMMEDIATELY AT [phone]! YOU WILL BE GIVEN A 13 HOUR PREMEDICATION PREP.  1) Do not eat or drink anything after 9:30am (4 hours prior to your test) 2) You have been given 2 bottles of oral contrast to drink. The solution may taste better if refrigerated, but do NOT add ice or any other liquid to this solution. Shake well before drinking.    Drink 1 bottle of contrast @ 11:30am (2 hours prior to your exam)  Drink 1 bottle of contrast @ 12:30pm (1 hour prior to your exam)  You may take any medications as prescribed with a small amount of water except for the following: Metformin, Glucophage, Glucovance, Avandamet, Riomet, Fortamet, Actoplus Met, Janumet, Glumetza or Metaglip. The above medications must be held the day of the exam AND 48 hours after the exam.  The purpose of you drinking the oral contrast is to aid in the visualization of your intestinal tract. The contrast solution may cause some diarrhea. Before your exam is started, you will be given a small amount of fluid to drink. Depending on your individual set of symptoms, you may also receive an intravenous injection of x-ray contrast/dye. Plan on being at [facility] for 30 minutes or longer, depending on the type of exam you are having performed.  This test typically takes 30-45 minutes to complete.  If you have any questions regarding your exam or if you need to reschedule, you may call the CT department at [phone] between the hours of 8:00 am and 5:00 pm, Monday-Friday.  ________________________________________________________________________  Your physician has  requested that you go to the basement for lab work before leaving today.

## 2015-04-28 NOTE — Progress Notes (Addendum)
Patient ID: [name], male   DOB: 04-07-1929, 81 y.o.   MRN: [id number]    HPI:  [name] is a 81 y.o.   male  referred by Christian Branch, MD for evaluation of abdominal pain. Mr. held has past medical history of hypertension, hypothyroidism, eczema, peptic ulcer disease with H. pylori positivity, erectile dysfunction, hypertriglyceridemia. 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 He is known to [name] with a history of GERD. His last endoscopy was on 08/19/2010 at which time he was noted to have mild gastritis and a healed ulcer. He states he had a colonoscopy last year with [name] of [facility] in [facility]. He is not sure what the findings were.  [name] says he has been experiencing intermittent left upper quadrant pain for several months. Pain tends to occur postprandially. It is sometimes alleviated with passage of gas or defecation. He eats into the left lower quadrant. He has intermittent waves of nausea but has not vomited. His appetite has been good but he thinks he has lost a few pounds though he is not sure because he says his weight fluctuates a few pounds either way. He denies use of nonsteroidal anti-inflammatories. He denies use of alcohol. He denies heartburn, early satiety, dysphagia, or odynophagia. He has had no associated fever, chills, or night sweats. He has been moving his bowels regularly and has had no bright red blood per rectum or melena.   Past Medical History  Diagnosis Date  . Hypertension   . Hypothyroidism     Hypothyroid  . Erectile dysfunction   . Osteopenia      per DEXA 07-2008 (Rx fosamax)  . Insomnia   . Eczema   . Gastric ulcer 12/11    H-Pylori Tx, EGD 3-12: gastritis  . Epistaxis     f/u per ENT  . Adenocarcinoma [facility])     Christian s/p right hemicolectomy and chemotherapy. F/U with cancer center and [name] rotinely  . Hay fever     Past Surgical History    Procedure Laterality Date  . Colonoscopy  2005, 2008    [name]  . Inguinal hernia repair      Right  . Christian surgery      Right / Adenocarcinoma / Chemo   Family History  Problem Relation Age of Onset  . Diabetes Neg Hx   . Heart attack Neg Hx   . Christian cancer Neg Hx   . Prostate cancer Neg Hx    Social History  Substance Use Topics  . Smoking status: Never Smoker   . Smokeless tobacco: Never Used  . Alcohol Use: No   Current Outpatient Prescriptions  Medication Sig Dispense Refill  . aspirin 81 MG tablet Take 81 mg by mouth daily.      . calcium-vitamin D (OSCAL WITH D) 500-200 MG-UNIT per tablet Take 1 tablet by mouth daily.      . fluocinonide ointment (LIDEX) 0.05 %     . fluticasone (FLONASE) 50 MCG/ACT nasal spray 2 sprays by Nasal route daily.      [name] (GLUCOSAMINE MSM COMPLEX PO) Take 1,500 mg by mouth daily.    Marland Kitchen levothyroxine (SYNTHROID, LEVOTHROID) 100 MCG tablet 1 by mouth daily, LABS DUE 30 tablet 0  . loratadine (CLARITIN) 10 MG tablet Take 10 mg by mouth daily.    Marland Kitchen losartan-hydrochlorothiazide (HYZAAR) 50-12.5 MG per tablet TAKE ONE TABLET BY  MOUTH ONE TIME DAILY 30 tablet 0  . multivitamin (THERAGRAN) per tablet Take 1 tablet by mouth daily.      . Omega-3 Fatty Acids (OMEGA 3 PO) Take 1 tablet by mouth daily.      Marland Kitchen omeprazole (PRILOSEC) 40 MG capsule TAKE ONE CAPSULE BY MOUTH ONE TIME DAILY 30 capsule 4  . REMICADE 100 MG injection   6  . sildenafil (VIAGRA) 100 MG tablet Take 1 tablet (100 mg total) by mouth daily as needed. 20 tablet 0   No current facility-administered medications for this visit.   No Known Allergies   Review of Systems: Gen: Denies any fever, chills, sweats, anorexia, fatigue, weakness, malaise, weight loss, and sleep disorder CV: Denies chest pain, angina, palpitations, syncope, orthopnea, PND, peripheral edema, and claudication. Resp: Denies dyspnea at rest, dyspnea with exercise, cough, sputum,  wheezing, coughing up blood, and pleurisy. GI: Denies vomiting blood, jaundice, and fecal incontinence.   Denies dysphagia or odynophagia. GU : Denies urinary burning, blood in urine, urinary frequency, urinary hesitancy, nocturnal urination, and urinary incontinence. MS: Denies joint pain, limitation of movement, and swelling, stiffness, low back pain, extremity pain. Denies muscle weakness, cramps, atrophy.  Derm: Denies rash, itching, dry skin, hives, moles, warts, or unhealing ulcers.  Psych: Denies depression, anxiety, memory loss, suicidal ideation, hallucinations, paranoia, and confusion. Heme: Denies bruising, bleeding, and enlarged lymph nodes. Neuro:  Denies any headaches, dizziness, paresthesias. Endo:  Denies any problems with DM, thyroid, adrenal function    Prior Endoscopies:  See history of present illness  Physical Exam: BP 128/60 mmHg  Pulse 72  Ht 5\' 1"  (1.549 m)  Wt 128 lb 9.6 oz (58.333 kg)  BMI 24.31 kg/m2 Constitutional: Pleasant,well-developed, Asian male in no acute distress. HEENT: Normocephalic and atraumatic. Conjunctivae are normal. No scleral icterus. Neck supple. No JVD Cardiovascular: Normal rate, regular rhythm.  Pulmonary/chest: Effort normal and breath sounds normal. No wheezing, rales or rhonchi. Abdominal: Soft, nondistended, mild diffuse left-sided abdominal pain with no rebound or guarding Bowel sounds active throughout. There are no masses palpable. No hepatomegaly. Extremities: no edema Lymphadenopathy: No cervical adenopathy noted. Neurological: Alert and oriented to person place and time. Skin: Skin is warm and dry. No rashes noted. Psychiatric: Normal mood and affect. Behavior is normal.  ASSESSMENT AND PLAN: 81 year old male with a personal history of Christian cancer and peptic ulcer disease, presenting with a several month history of intermittent left-sided abdominal pain associated with nausea. Patient has been instructed to continue his  omeprazole. He has signed a medical release to obtain a copy of his colonoscopy from [facility] and his recent offer notes from Dr. Karle Starch in [place].(Patient states he sees both Dr. Karle Starch and Dr. Larose Kells for his primary care provider.) A CBC, basic metabolic panel, lipase, hepatic function panel will be obtained along with an H. pylori stool antigen. He will be scheduled for a CT of the abdomen and pelvis to further help evaluate an etiology for his pain. Further recommendations will be made pending the findings of the above.    Rajinder Mesick, Vita Barley PA-C 04/28/2015, 2:20 PM  CC: Christian Branch, MD  05/03/2015: We received colonoscopy report dated 07/22/2013. Colonoscopy was performed by [name] in [facility]. The anastomosis was noted to be normal. A diminutive polyp was removed with cold forceps from the proximal Christian. Grade 1 internal hemorrhoids were noted. Patient had an excellent prep. Pathology revealed colonic mucosa with focal adenomatous type changes. Benign but precancerous polyp repeat colonoscopy  1 year.

## 2015-04-30 ENCOUNTER — Encounter: Payer: Self-pay | Admitting: *Deleted

## 2015-05-03 NOTE — Progress Notes (Signed)
Agree w/ Ms. Hvozdovic's note and mangement.  

## 2015-05-04 ENCOUNTER — Ambulatory Visit (HOSPITAL_COMMUNITY)
Admission: RE | Admit: 2015-05-04 | Discharge: 2015-05-04 | Disposition: A | Discharge status: Home / Self Care | Payer: Medicare Other | Source: Ambulatory Visit | Attending: Physician Assistant | Admitting: Physician Assistant

## 2015-05-04 DIAGNOSIS — Z85038 Personal history of other malignant neoplasm of large intestine: Secondary | ICD-10-CM | POA: Insufficient documentation

## 2015-05-04 DIAGNOSIS — R110 Nausea: Secondary | ICD-10-CM | POA: Diagnosis present

## 2015-05-04 DIAGNOSIS — I700 Atherosclerosis of aorta: Secondary | ICD-10-CM | POA: Diagnosis not present

## 2015-05-04 DIAGNOSIS — R1012 Left upper quadrant pain: Secondary | ICD-10-CM

## 2015-05-04 MED ORDER — IOHEXOL 300 MG/ML  SOLN
100.0000 mL | Freq: Once | INTRAMUSCULAR | Status: AC | PRN
  Administered 2015-05-04: 100 mL via INTRAVENOUS

## 2015-05-07 ENCOUNTER — Telehealth: Payer: Self-pay | Admitting: Internal Medicine

## 2015-05-07 NOTE — Telephone Encounter (Signed)
Call placed to pt in regards to the appt in Feb.  Per the CT report pt needs to discuss repeat colonoscopy.  Left message on machine to call back

## 2015-05-10 ENCOUNTER — Ambulatory Visit: Payer: Medicare Other | Admitting: Internal Medicine

## 2015-05-13 NOTE — Telephone Encounter (Signed)
Pt has been notified of the reason for his follow up appt with Dr Carlean Purl, he will keep the appt as scheduled to discuss colonoscopy and CT results

## 2015-07-01 ENCOUNTER — Ambulatory Visit (INDEPENDENT_AMBULATORY_CARE_PROVIDER_SITE_OTHER): Payer: Medicare Other | Admitting: Internal Medicine

## 2015-07-01 ENCOUNTER — Encounter: Payer: Self-pay | Admitting: Internal Medicine

## 2015-07-01 VITALS — BP 128/60 | HR 66 | Ht 61.0 in | Wt 130.6 lb

## 2015-07-01 DIAGNOSIS — R1012 Left upper quadrant pain: Secondary | ICD-10-CM

## 2015-07-01 NOTE — Progress Notes (Signed)
   Subjective:    Patient ID: [name], male    DOB: 04-Dec-1928, 82 y.o.   MRN: [id number] Chief complaint: Follow-up left upper quadrant pain HPI [name] was here a month or so ago and saw one of our physician assistance. He was having intermittent left upper quadrant pain sometimes related to meals. CT scanning was unremarkable and the pain is gone. Medications, allergies, past medical history, past surgical history, family history and social history are reviewed and updated in the EMR.   Review of Systems As above    Objective:   Physical Exam BP 128/60 mmHg  Pulse 66  Ht 5\' 1"  (1.549 m)  Wt 130 lb 9.6 oz (59.24 kg)  BMI 24.69 kg/m2 No acute distress    Assessment & Plan:   1. LUQ pain    This seems resolved. Cause not clear probably a functional disturbance. See me as needed.

## 2015-07-01 NOTE — Patient Instructions (Signed)
   Glad to hear you are better.  Please see me as needed.  I appreciate the opportunity to care for you. Gatha Mayer, MD, [facility]

## 2015-09-28 ENCOUNTER — Ambulatory Visit (HOSPITAL_BASED_OUTPATIENT_CLINIC_OR_DEPARTMENT_OTHER): Payer: Medicare Other | Admitting: Oncology

## 2015-09-28 ENCOUNTER — Telehealth: Payer: Self-pay | Admitting: Oncology

## 2015-09-28 VITALS — BP 146/47 | HR 65 | Temp 97.9°F | Resp 18 | Ht 61.0 in | Wt 128.8 lb

## 2015-09-28 DIAGNOSIS — Z85038 Personal history of other malignant neoplasm of large intestine: Secondary | ICD-10-CM | POA: Diagnosis not present

## 2015-09-28 DIAGNOSIS — M069 Rheumatoid arthritis, unspecified: Secondary | ICD-10-CM

## 2015-09-28 DIAGNOSIS — C182 Malignant neoplasm of ascending colon: Secondary | ICD-10-CM

## 2015-09-28 NOTE — Progress Notes (Signed)
  [place] OFFICE PROGRESS NOTE   Diagnosis:  Colon cancer  INTERVAL HISTORY:    Mr. Hendel returns as scheduled. He reports being diagnosed with rheumatoid arthritis. He was treated with Remicade and is now on methotrexate/prednisone. He otherwise feels well. Arthritis symptoms in his hands have improved. No other complaint.  Objective:  Vital signs in last 24 hours:  Blood pressure 146/47, pulse 65, temperature 97.9 F (36.6 C), temperature source Oral, resp. rate 18, height 5\' 1"  (1.549 m), weight 128 lb 12.8 oz (58.423 kg), SpO2 100 %.    HEENT:  No thrush or ulcers Lymphatics:  No cervical, supra-clavicular, axillary, or inguinal nodes Resp:  Lungs clear bilaterally Cardio:  Regular rate and rhythm GI:  No hepatomegaly, no mass, nontender Vascular:  No leg edema   Medications: I have reviewed the patient's current medications.  Assessment/Plan:   Mr. Berley was diagnosed with stage III colon cancer in January 2004. He remains in clinical remission. He would like to continue follow-up at the [facility]. He will return for an office visit in one year.   He is now followed closely by rheumatology for treatment of rheumatoid arthritis.   Betsy Coder, MD  09/28/2015  10:43 AM

## 2015-09-28 NOTE — Telephone Encounter (Signed)
Gave pt appt & avs °

## 2016-01-28 ENCOUNTER — Ambulatory Visit (INDEPENDENT_AMBULATORY_CARE_PROVIDER_SITE_OTHER): Payer: Medicare Other

## 2016-01-28 DIAGNOSIS — Z23 Encounter for immunization: Secondary | ICD-10-CM

## 2016-09-26 ENCOUNTER — Ambulatory Visit: Payer: Medicare Other | Admitting: Oncology

## 2016-10-06 ENCOUNTER — Ambulatory Visit (HOSPITAL_BASED_OUTPATIENT_CLINIC_OR_DEPARTMENT_OTHER): Payer: Medicare Other | Admitting: Oncology

## 2016-10-06 ENCOUNTER — Telehealth: Payer: Self-pay | Admitting: Oncology

## 2016-10-06 VITALS — BP 148/63 | HR 66 | Temp 98.2°F | Resp 17 | Ht 61.0 in | Wt 127.5 lb

## 2016-10-06 DIAGNOSIS — Z85038 Personal history of other malignant neoplasm of large intestine: Secondary | ICD-10-CM

## 2016-10-06 DIAGNOSIS — C182 Malignant neoplasm of ascending colon: Secondary | ICD-10-CM

## 2016-10-06 NOTE — Progress Notes (Signed)
**Christian Christian**   Christian Christian   Diagnosis: Colon cancer  INTERVAL HISTORY:   Christian Christian returns as scheduled. He continues methotrexate and Remicade for rheumatoid arthritis. He reports the arthritis has improved. Good appetite. No difficulty with bowel function. No other complaint.  Objective:  Vital signs in last 24 hours:  Blood pressure (!) 148/63, pulse 66, temperature 98.2 F (36.8 C), temperature source Oral, resp. rate 17, height 5\' 1"  (1.549 m), weight 127 lb 8 oz (57.8 kg), SpO2 97 %.    HEENT: Neck without mass Lymphatics: No cervical, supraclavicular, axillary, or inguinal nodes Resp: Lungs clear bilaterally Cardio: Regular rate and rhythm GI: No hepatosplenomegaly, no mass, nontender Vascular: No leg edema Musculoskeletal: Sign no B of hypertrophy at the hand joints bilaterally     Lab Results:    Medications: I have reviewed the patient's current medications.  Assessment/Plan:  Christian Christian was diagnosed with stage III colon cancer in January 2004. He remains in clinical remission. He would like to continue follow-up at the [facility]. He will return for an office visit in one year  15 minutes were spent with the patient today. The majority of the time was used for counseling and coordination of care.  Betsy Coder, MD  10/06/2016  8:32 AM

## 2016-10-06 NOTE — Telephone Encounter (Signed)
Gave patient avs report and appointments for May 2019.

## 2017-04-17 ENCOUNTER — Ambulatory Visit: Payer: Medicare Other | Admitting: Family Medicine

## 2017-10-05 ENCOUNTER — Inpatient Hospital Stay: Payer: Medicare Other | Attending: Oncology | Admitting: Oncology

## 2017-10-05 ENCOUNTER — Telehealth: Payer: Self-pay | Admitting: Oncology

## 2017-10-05 VITALS — BP 146/56 | HR 76 | Temp 98.0°F | Resp 18 | Ht 61.0 in | Wt 128.8 lb

## 2017-10-05 DIAGNOSIS — M25542 Pain in joints of left hand: Secondary | ICD-10-CM

## 2017-10-05 DIAGNOSIS — C182 Malignant neoplasm of ascending colon: Secondary | ICD-10-CM

## 2017-10-05 DIAGNOSIS — M25541 Pain in joints of right hand: Secondary | ICD-10-CM | POA: Diagnosis not present

## 2017-10-05 DIAGNOSIS — Z85038 Personal history of other malignant neoplasm of large intestine: Secondary | ICD-10-CM | POA: Insufficient documentation

## 2017-10-05 DIAGNOSIS — M25442 Effusion, left hand: Secondary | ICD-10-CM | POA: Diagnosis not present

## 2017-10-05 NOTE — Telephone Encounter (Signed)
Appointments scheduled AVS/Calendar printed per 5/17 los °

## 2017-10-05 NOTE — Progress Notes (Signed)
  [place] OFFICE PROGRESS NOTE   Diagnosis: Colon cancer  INTERVAL HISTORY:   [name] returns for a scheduled visit.  He complains of pain in the hands, chiefly at night.  He is followed by rheumatology and maintained on methotrexate/prednisone.  Good appetite.  No difficulty with bowel function.  Objective:  Vital signs in last 24 hours:  Blood pressure (!) 146/56, pulse 76, temperature 98 F (36.7 C), temperature source Oral, resp. rate 18, height 5\' 1"  (1.549 m), weight 128 lb 12.8 oz (58.4 kg), SpO2 98 %.    HEENT: Neck without mass Lymphatics: No cervical, supraclavicular, axillary, or inguinal nodes Resp: Lungs clear bilaterally Cardio: Regular rate and rhythm GI: No hepatosplenomegaly, no mass, nontender Vascular: No leg edema Musculoskeletal: Edema and hypertrophy at the hand joints    Medications: I have reviewed the patient's current medications.   Assessment/Plan:  [name] was diagnosed with stage III colon cancer in January 2004.  He remains in clinical remission.  He last underwent a colonoscopy in March 2015.  An adenomatous polyp was removed from the proximal colon.  I recommended he continue colonoscopy follow-up with Dr. Marin Comment.  The hand pain is likely related to rheumatoid arthritis.  He will follow-up with his rheumatologist.  Disposition: [name] would like to continue follow-up at the Cancer center.  He will return for an office visit in 1 year.  15 minutes were spent with the patient today.  The majority of the time was used for counseling and coordination of care.  Betsy Coder, MD  10/05/2017  11:35 AM

## 2017-11-19 DIAGNOSIS — S0990XA Unspecified injury of head, initial encounter: Secondary | ICD-10-CM

## 2017-11-19 HISTORY — DX: Unspecified injury of head, initial encounter: S09.90XA

## 2017-12-10 ENCOUNTER — Encounter (HOSPITAL_COMMUNITY): Payer: Self-pay | Admitting: Emergency Medicine

## 2017-12-10 ENCOUNTER — Inpatient Hospital Stay (HOSPITAL_COMMUNITY)
Admission: EM | Admit: 2017-12-10 | Discharge: 2017-12-14 | DRG: 065 | Disposition: A | Discharge status: Home Health | Payer: Medicare Other | Attending: Internal Medicine | Admitting: Internal Medicine

## 2017-12-10 ENCOUNTER — Other Ambulatory Visit: Payer: Self-pay

## 2017-12-10 ENCOUNTER — Emergency Department (HOSPITAL_COMMUNITY): Payer: Medicare Other

## 2017-12-10 DIAGNOSIS — Z7952 Long term (current) use of systemic steroids: Secondary | ICD-10-CM

## 2017-12-10 DIAGNOSIS — Z7982 Long term (current) use of aspirin: Secondary | ICD-10-CM

## 2017-12-10 DIAGNOSIS — Z85038 Personal history of other malignant neoplasm of large intestine: Secondary | ICD-10-CM

## 2017-12-10 DIAGNOSIS — Z9221 Personal history of antineoplastic chemotherapy: Secondary | ICD-10-CM

## 2017-12-10 DIAGNOSIS — R531 Weakness: Secondary | ICD-10-CM | POA: Diagnosis not present

## 2017-12-10 DIAGNOSIS — I651 Occlusion and stenosis of basilar artery: Secondary | ICD-10-CM

## 2017-12-10 DIAGNOSIS — R296 Repeated falls: Secondary | ICD-10-CM | POA: Diagnosis present

## 2017-12-10 DIAGNOSIS — I6381 Other cerebral infarction due to occlusion or stenosis of small artery: Secondary | ICD-10-CM | POA: Diagnosis not present

## 2017-12-10 DIAGNOSIS — N529 Male erectile dysfunction, unspecified: Secondary | ICD-10-CM | POA: Diagnosis present

## 2017-12-10 DIAGNOSIS — G8194 Hemiplegia, unspecified affecting left nondominant side: Secondary | ICD-10-CM | POA: Diagnosis present

## 2017-12-10 DIAGNOSIS — Z9049 Acquired absence of other specified parts of digestive tract: Secondary | ICD-10-CM

## 2017-12-10 DIAGNOSIS — Z8711 Personal history of peptic ulcer disease: Secondary | ICD-10-CM

## 2017-12-10 DIAGNOSIS — G459 Transient cerebral ischemic attack, unspecified: Secondary | ICD-10-CM

## 2017-12-10 DIAGNOSIS — M069 Rheumatoid arthritis, unspecified: Secondary | ICD-10-CM

## 2017-12-10 DIAGNOSIS — Z7951 Long term (current) use of inhaled steroids: Secondary | ICD-10-CM

## 2017-12-10 DIAGNOSIS — I771 Stricture of artery: Secondary | ICD-10-CM

## 2017-12-10 DIAGNOSIS — E039 Hypothyroidism, unspecified: Secondary | ICD-10-CM | POA: Diagnosis present

## 2017-12-10 DIAGNOSIS — Z79899 Other long term (current) drug therapy: Secondary | ICD-10-CM

## 2017-12-10 DIAGNOSIS — Z880 Allergy status to penicillin: Secondary | ICD-10-CM

## 2017-12-10 DIAGNOSIS — I10 Essential (primary) hypertension: Secondary | ICD-10-CM | POA: Diagnosis present

## 2017-12-10 DIAGNOSIS — G4700 Insomnia, unspecified: Secondary | ICD-10-CM | POA: Diagnosis present

## 2017-12-10 DIAGNOSIS — Z79890 Hormone replacement therapy (postmenopausal): Secondary | ICD-10-CM

## 2017-12-10 DIAGNOSIS — J301 Allergic rhinitis due to pollen: Secondary | ICD-10-CM | POA: Diagnosis present

## 2017-12-10 LAB — DIFFERENTIAL
Abs Immature Granulocytes: 0 10*3/uL (ref 0.0–0.1)
Basophils Absolute: 0 10*3/uL (ref 0.0–0.1)
Basophils Relative: 0 %
Eosinophils Absolute: 0.1 10*3/uL (ref 0.0–0.7)
Eosinophils Relative: 1 %
Immature Granulocytes: 1 %
Lymphocytes Relative: 21 %
Lymphs Abs: 1.6 10*3/uL (ref 0.7–4.0)
Monocytes Absolute: 0.8 10*3/uL (ref 0.1–1.0)
Monocytes Relative: 11 %
Neutro Abs: 5.1 10*3/uL (ref 1.7–7.7)
Neutrophils Relative %: 66 %

## 2017-12-10 LAB — CBC
HCT: 41.4 % (ref 39.0–52.0)
Hemoglobin: 13.2 g/dL (ref 13.0–17.0)
MCH: 29.7 pg (ref 26.0–34.0)
MCHC: 31.9 g/dL (ref 30.0–36.0)
MCV: 93 fL (ref 78.0–100.0)
Platelets: 215 10*3/uL (ref 150–400)
RBC: 4.45 MIL/uL (ref 4.22–5.81)
RDW: 14.4 % (ref 11.5–15.5)
WBC: 7.7 10*3/uL (ref 4.0–10.5)

## 2017-12-10 LAB — I-STAT CHEM 8, ED
BUN: 21 mg/dL (ref 8–23)
Calcium, Ion: 1.18 mmol/L (ref 1.15–1.40)
Chloride: 99 mmol/L (ref 98–111)
Creatinine, Ser: 1.1 mg/dL (ref 0.61–1.24)
Glucose, Bld: 105 mg/dL — ABNORMAL HIGH (ref 70–99)
HCT: 38 % — ABNORMAL LOW (ref 39.0–52.0)
Hemoglobin: 12.9 g/dL — ABNORMAL LOW (ref 13.0–17.0)
Potassium: 3.6 mmol/L (ref 3.5–5.1)
Sodium: 139 mmol/L (ref 135–145)
TCO2: 30 mmol/L (ref 22–32)

## 2017-12-10 LAB — COMPREHENSIVE METABOLIC PANEL
ALT: 18 U/L (ref 0–44)
AST: 26 U/L (ref 15–41)
Albumin: 3.8 g/dL (ref 3.5–5.0)
Alkaline Phosphatase: 67 U/L (ref 38–126)
Anion gap: 12 (ref 5–15)
BUN: 19 mg/dL (ref 8–23)
CO2: 27 mmol/L (ref 22–32)
Calcium: 9.7 mg/dL (ref 8.9–10.3)
Chloride: 99 mmol/L (ref 98–111)
Creatinine, Ser: 1.11 mg/dL (ref 0.61–1.24)
GFR calc Af Amer: >60 mL/min (ref >60)
GFR calc non Af Amer: 58 mL/min — ABNORMAL LOW (ref >60)
Glucose, Bld: 106 mg/dL — ABNORMAL HIGH (ref 70–99)
Potassium: 3.8 mmol/L (ref 3.5–5.1)
Sodium: 138 mmol/L (ref 135–145)
Total Bilirubin: 0.5 mg/dL (ref 0.3–1.2)
Total Protein: 7.2 g/dL (ref 6.5–8.1)

## 2017-12-10 LAB — I-STAT TROPONIN, ED: Troponin i, poc: 0.01 ng/mL (ref 0.00–0.08)

## 2017-12-10 LAB — PROTIME-INR
INR: 1.01
Prothrombin Time: 13.2 seconds (ref 11.4–15.2)

## 2017-12-10 LAB — APTT: aPTT: 32 seconds (ref 24–36)

## 2017-12-10 MED ORDER — STROKE: EARLY STAGES OF RECOVERY BOOK
Freq: Once | Status: DC
  Filled 2017-12-10: qty 1

## 2017-12-10 MED ORDER — ACETAMINOPHEN 160 MG/5ML PO SOLN: 650.0000 mg | ORAL | Status: DC | PRN

## 2017-12-10 MED ORDER — ACETAMINOPHEN 650 MG RE SUPP: 650.0000 mg | RECTAL | Status: DC | PRN

## 2017-12-10 MED ORDER — ACETAMINOPHEN 325 MG PO TABS: 650.0000 mg | ORAL_TABLET | ORAL | Status: DC | PRN

## 2017-12-10 MED ORDER — ASPIRIN 325 MG PO TABS
325.0000 mg | ORAL_TABLET | Freq: Every day | ORAL | Status: DC
  Filled 2017-12-10: qty 1

## 2017-12-10 MED ORDER — ASPIRIN 300 MG RE SUPP: 300.0000 mg | Freq: Every day | RECTAL | Status: DC

## 2017-12-10 NOTE — ED Notes (Signed)
Pt's [name] contacted [facility] to give further information on what happened to pt. Family member states that pt has had an increase in falls over the last 3 weeks, pt baseline ambulatory with cane. Yesterday morning pt had two falls and at 0500 pt hit his head on night stand. Pt is not on blood thinners per family.

## 2017-12-10 NOTE — ED Notes (Signed)
Patient's daughter, [phone]

## 2017-12-10 NOTE — H&P (Signed)
History and Physical    [name] DGU:[id number] DOB: 1928/08/07 DOA: 12/10/2017  PCP: Colon Branch, MD  Patient coming from: Home  Chief Complaint: Left-sided arm numbness and leg weakness numbness  HPI: [name] is a 84 y.o. male with medical history significant of hypertension, rheumatoid arthritis comes in with 3 weeks of waxing and waning left-sided numbness and weakness that has been occurring on and off.  He reports that he gets very numb the whole entire left side he cannot move it which is been causing frequent falls.  He currently denies any symptoms.  He denies any fevers.  He denies any headaches.  He does not have a history of previous stroke.  Patient take a daily aspirin a day.  Patient is being referred for admission for TIA work-up.   Review of Systems: As per HPI otherwise 10 point review of systems negative.   Past Medical History:  Diagnosis Date  . Adenocarcinoma [facility])    colon s/p right hemicolectomy and chemotherapy. F/U with cancer center and Dr. Lucia Gaskins rotinely  . Eczema   . Epistaxis    f/u per ENT  . Erectile dysfunction   . Gastric ulcer 12/11   H-Pylori Tx, EGD 3-12: gastritis  . Hay fever   . Hypertension   . Hypothyroidism    Hypothyroid  . Insomnia   . Osteopenia     per DEXA 07-2008 (Rx fosamax)  . Rheumatoid arthritis [facility])     Past Surgical History:  Procedure Laterality Date  . COLON SURGERY     Right / Adenocarcinoma / Chemo  . COLONOSCOPY  2005, 2008   Dr. Lucia Gaskins  . INGUINAL HERNIA REPAIR     Right     reports that he has never smoked. He has never used smokeless tobacco. He reports that he does not drink alcohol or use drugs.  Allergies  Allergen Reactions  . Penicillin G Rash    Family History  Problem Relation Age of Onset  . Diabetes Neg Hx   . Heart attack Neg Hx   . Colon cancer Neg Hx   . Prostate cancer Neg Hx     Prior to Admission medications   Medication Sig Start Date End Date Taking? Authorizing Provider    aspirin 81 MG tablet Take 81 mg by mouth daily.      [provider]  calcium-vitamin D (OSCAL WITH D) 500-200 MG-UNIT per tablet Take 1 tablet by mouth daily.      [provider]  Cholecalciferol (VITAMIN D-1000 MAX ST) 1000 units tablet Take 1,000 mg by mouth 2 (two) times daily.    [provider]  fluocinonide ointment (LIDEX) 0.05 %  07/29/13   [provider]  fluticasone (FLONASE) 50 MCG/ACT nasal spray 2 sprays by Nasal route daily.      [provider]  folic acid (FOLVITE) 1 MG tablet Take 1 mg by mouth daily.    [provider]  Glucos-MSM-C-Mn-Ginger-Willow (GLUCOSAMINE MSM COMPLEX PO) Take 1,500 mg by mouth daily.    [provider]  hydrochlorothiazide (MICROZIDE) 12.5 MG capsule Take 12.5 mg by mouth daily. 12/05/17   [provider]  levothyroxine (SYNTHROID, LEVOTHROID) 88 MCG tablet Take 88 mcg by mouth daily. 10/20/16 10/20/17  [provider]  loratadine (CLARITIN) 10 MG tablet Take 10 mg by mouth daily.    [provider]  losartan (COZAAR) 25 MG tablet Take 25 mg by mouth daily. 12/05/17   [provider]  losartan-hydrochlorothiazide (HYZAAR) 50-12.5 MG per tablet TAKE ONE TABLET BY MOUTH ONE TIME DAILY 02/23/12   Colon Branch, MD  methotrexate (RHEUMATREX) 2.5 MG tablet Take 15 mg by mouth once a week. 06/10/15   [provider]  multivitamin [facility]) per tablet Take 1 tablet by mouth daily.      [provider]  Omega-3 Fatty Acids (OMEGA 3 PO) Take 1 tablet by mouth daily.      [provider]  omeprazole (PRILOSEC) 40 MG capsule TAKE ONE CAPSULE BY MOUTH ONE TIME DAILY 11/30/11   Colon Branch, MD  predniSONE (DELTASONE) 5 MG tablet Take 5 mg by mouth daily with breakfast.    [provider]  sildenafil (VIAGRA) 100 MG tablet Take 1 tablet (100 mg total) by mouth daily as needed. 11/30/10   Colon Branch, MD  Vitamin D, Ergocalciferol, (DRISDOL)  50000 units CAPS capsule Take 50,000 Units by mouth once a week. 08/25/16   [provider]    Physical Exam: Vitals:   12/10/17 1954 12/10/17 2108 12/10/17 2246 12/10/17 2253  BP: 140/65 (!) 149/64 (!) 155/72   Pulse: 63 (!) 59 68   Resp: 14 14    Temp: 98.3 F (36.8 C)   98 F (36.7 C)  TempSrc: Oral     SpO2: 98% 98% 97%       Constitutional: NAD, calm, comfortable Vitals:   12/10/17 1954 12/10/17 2108 12/10/17 2246 12/10/17 2253  BP: 140/65 (!) 149/64 (!) 155/72   Pulse: 63 (!) 59 68   Resp: 14 14    Temp: 98.3 F (36.8 C)   98 F (36.7 C)  TempSrc: Oral     SpO2: 98% 98% 97%    Eyes: PERRL, lids and conjunctivae normal ENMT: Mucous membranes are moist. Posterior pharynx clear of any exudate or lesions.Normal dentition.  Neck: normal, supple, no masses, no thyromegaly Respiratory: clear to auscultation bilaterally, no wheezing, no crackles. Normal respiratory effort. No accessory muscle use.  Cardiovascular: Regular rate and rhythm, no murmurs / rubs / gallops. No extremity edema. 2+ pedal pulses. No carotid bruits.  Abdomen: no tenderness, no masses palpated. No hepatosplenomegaly. Bowel sounds positive.  Musculoskeletal: no clubbing / cyanosis. No joint deformity upper and lower extremities. Good ROM, no contractures. Normal muscle tone.  Skin: no rashes, lesions, ulcers. No induration Neurologic: CN 2-12 grossly intact. Sensation intact, DTR normal. Strength 5/5 in all 4.  Psychiatric: Normal judgment and insight. Alert and oriented x 3. Normal mood.    Labs on Admission: I have personally reviewed following labs and imaging studies  CBC: Recent Labs  Lab 12/10/17 2012 12/10/17 2024  WBC 7.7  --   NEUTROABS 5.1  --   HGB 13.2 12.9*  HCT 41.4 38.0*  MCV 93.0  --   PLT 215  --    Basic Metabolic Panel: Recent Labs  Lab 12/10/17 2012 12/10/17 2024  NA 138 139  K 3.8 3.6  CL 99 99  CO2 27  --   GLUCOSE 106* 105*  BUN 19 21  CREATININE 1.11  1.10  CALCIUM 9.7  --    GFR: CrCl cannot be calculated (Unknown ideal weight.). Liver Function Tests: Recent Labs  Lab 12/10/17 2012  AST 26  ALT 18  ALKPHOS 67  BILITOT 0.5  PROT 7.2  ALBUMIN 3.8   No results for input(s): LIPASE, AMYLASE in the last 168 hours. No results for input(s): AMMONIA in the last 168 hours. Coagulation Profile: Recent  Labs  Lab 12/10/17 2012  INR 1.01   Cardiac Enzymes: No results for input(s): CKTOTAL, CKMB, CKMBINDEX, TROPONINI in the last 168 hours. BNP (last 3 results) No results for input(s): PROBNP in the last 8760 hours. HbA1C: No results for input(s): HGBA1C in the last 72 hours. CBG: No results for input(s): GLUCAP in the last 168 hours. Lipid Profile: No results for input(s): CHOL, HDL, LDLCALC, TRIG, CHOLHDL, LDLDIRECT in the last 72 hours. Thyroid Function Tests: No results for input(s): TSH, T4TOTAL, FREET4, T3FREE, THYROIDAB in the last 72 hours. Anemia Panel: No results for input(s): VITAMINB12, FOLATE, FERRITIN, TIBC, IRON, RETICCTPCT in the last 72 hours. Urine analysis:    Component Value Date/Time   COLORURINE yellow 03/17/2010 1010   APPEARANCEUR Clear 03/17/2010 1010   LABSPEC 1.010 03/17/2010 1010   PHURINE 7.5 03/17/2010 1010   HGBUR negative 03/17/2010 1010   BILIRUBINUR negative 03/17/2010 1010   UROBILINOGEN 0.2 03/17/2010 1010   NITRITE negative 03/17/2010 1010   Sepsis Labs: !!!!!!!!!!!!!!!!!!!!!!!!!!!!!!!!!!!!!!!!!!!! @LABRCNTIP (procalcitonin:4,lacticidven:4) )No results found for this or any previous visit (from the past 240 hour(s)).   Radiological Exams on Admission: Ct Head Wo Contrast  Result Date: 12/10/2017 CLINICAL DATA:  Dizziness.  Left arm and leg numbness. EXAM: CT HEAD WITHOUT CONTRAST TECHNIQUE: Contiguous axial images were obtained from the base of the skull through the vertex without intravenous contrast. COMPARISON:  11/27/2017 FINDINGS: Brain: There is atrophy and chronic small vessel  disease changes. No acute intracranial abnormality. Specifically, no hemorrhage, hydrocephalus, mass lesion, acute infarction, or significant intracranial injury. Vascular: No hyperdense vessel or unexpected calcification. Skull: No acute calvarial abnormality. Sinuses/Orbits: No acute findings Other: None IMPRESSION: No acute intracranial abnormality. Atrophy, chronic microvascular disease. Electronically Signed   By: Rolm Baptise M.D.   On: 12/10/2017 20:42    EKG: Independently reviewed.  Normal sinus rhythm no acute changes  Old chart reviewed  Case discussed with Dr. Francia Greaves in the ED  Assessment/Plan 84 year old male with a history of hypertension comes in with TIA Principal Problem:   TIA (transient ischemic attack)-takes daily aspirin and can increase this to a full aspirin.  Obtain MRI of the brain in the morning along with carotid Dopplers and cardiac echo.  Frequent neurological checks.  Have advised patient to let us know if he has any recurrence of his symptoms.  Active Problems:   Essential hypertension-stable allow permissive hypertension at this time    DVT prophylaxis: SCDs Code Status:Full Family Communication: Daughter Disposition Plan: Likely tomorrow Consults called: Neurology Admission status: Observation   Elizardo Chilson A MD [facility]  If 7PM-7AM, please contact night-coverage [email or web address] Password [email or web address]  12/10/2017, 10:57 PM

## 2017-12-10 NOTE — ED Notes (Signed)
MD made aware of pt. Passing stroke swallow screen

## 2017-12-10 NOTE — ED Triage Notes (Signed)
Pt from home. States about 0730 began having dizziness and then noticed numbness on his left arm and left leg. Felt unstable to walk at that time.  Pt states numbness has since resolved. Neuro intact at this time. No nausea or vomiting. Has not fallen.

## 2017-12-10 NOTE — Consult Note (Signed)
Neurology Consultation Reason for Consult: Left sided weakness Referring Physician: Loralee Pacas  CC: Left Sided weakness  History is obtained from:patient, daughter  HPI: [name] is a 84 y.o. male with a history of hypertension, colon cancer who presents with intermittent left-sided weakness that has been occurring over the past few weeks.  He reports that about 3 weeks ago he stood up and became dizzy, and his left side went numb and weak.  This is caused him to fall.  Over the past 2 weeks it is become more frequent now occurring almost daily.   LKW: 3 weeks ago tpa given?: no, outside of window, mild symptoms   ROS: A 14 point ROS was performed and is negative except as noted in the HPI.   Past Medical History:  Diagnosis Date  . Adenocarcinoma [facility])    colon s/p right hemicolectomy and chemotherapy. F/U with cancer center and Dr. Lucia Gaskins rotinely  . Eczema   . Epistaxis    f/u per ENT  . Erectile dysfunction   . Gastric ulcer 12/11   H-Pylori Tx, EGD 3-12: gastritis  . Hay fever   . Hypertension   . Hypothyroidism    Hypothyroid  . Insomnia   . Osteopenia     per DEXA 07-2008 (Rx fosamax)  . Rheumatoid arthritis ([place])      Family History  Problem Relation Age of Onset  . Diabetes Neg Hx   . Heart attack Neg Hx   . Colon cancer Neg Hx   . Prostate cancer Neg Hx      Social History:  reports that he has never smoked. He has never used smokeless tobacco. He reports that he does not drink alcohol or use drugs.   Exam: Current vital signs: BP (!) 148/57   Pulse 64   Temp 98 F (36.7 C)   Resp 14   SpO2 98%  Vital signs in last 24 hours: Temp:  [98 F (36.7 C)-98.3 F (36.8 C)] 98 F (36.7 C) (07/22 2253) Pulse Rate:  [59-68] 64 (07/22 2315) Resp:  [14-20] 14 (07/22 2301) BP: (140-155)/(54-72) 148/57 (07/22 2315) SpO2:  [97 %-98 %] 98 % (07/22 2315)   Physical Exam  Constitutional: Appears well-developed and well-nourished.  Psych: Affect  appropriate to situation Eyes: No scleral injection HENT: No OP obstrucion Head: Normocephalic.  Cardiovascular: Normal rate and regular rhythm.  Respiratory: Effort normal, non-labored breathing GI: Soft.  No distension. There is no tenderness.  Skin: WDI  Neuro: Mental Status: Patient is awake, alert, oriented to person, place, month, year, and situation.  He gives  his age is 45.   Patient is able to give a clear and coherent history. No signs of aphasia or neglect Cranial Nerves: II: Visual Fields are full. Pupils are equal, round, and reactive to light.   III,IV, VI: EOMI without ptosis or diploplia.  V: Facial sensation is symmetric to temperature VII: Facial movement is symmetric.  VIII: hearing is intact to voice X: Uvula elevates symmetrically XI: Shoulder shrug is symmetric. XII: tongue is midline without atrophy or fasciculations.  Motor: Tone is normal. Bulk is normal. 5/5 strength was present in all four extremities.  Sensory: Sensation is symmetric to light touch and temperature in the arms and legs. Cerebellar: FNF and HKS are intact bilaterally  I have reviewed labs in epic and the results pertinent to this consultation are: Normal creatinine  I have reviewed the images obtained: CT head-no acute findings  Impression: 85 year old male with recurrent  episodes of left-sided numbness and weakness.  I suspect that this is recurrent TIA in the setting of hypoperfusion, and I am concerned he may have an area of stenosis.  He will need further evaluation and secondary risk factor modification.  Recommendations: - HgbA1c, fasting lipid panel - MRI   of the brain without contrast - Frequent neuro checks - Echocardiogram -CTA head and neck - Prophylactic therapy-Antiplatelet med: Aspirin - dose 325mg  PO or 300mg  PR - Risk factor modification - Telemetry monitoring - PT consult, OT consult, Speech consult - Stroke team to follow    Roland Rack,  MD [facility] [phone]  If 7pm- 7am, please page neurology on call as listed in [place].

## 2017-12-10 NOTE — ED Provider Notes (Signed)
Patient placed in Quick Look pathway, seen and evaluated   Chief Complaint: left sided weakness,  HPI:   Pt reports he has had some pain on the left side of his chest.  Pt reports he had numbness in left arm and left leg this am.  Pt reports numbness is improving now.   ROS: no fever, no cough   Physical Exam:   Gen: No distress  Neuro: Awake and Alert  Skin: Warm    Focused Exam: from bilat upper extremities , no facial drrop   Initiation of care has begun. The patient has been counseled on the process, plan, and necessity for staying for the completion/evaluation, and the remainder of the medical screening examination   Sidney Ace 12/10/17 2013    Varney Biles, MD 12/12/17 [phone]

## 2017-12-10 NOTE — ED Provider Notes (Signed)
[place] EMERGENCY DEPARTMENT Provider Note   CSN: [id number] Arrival date & time: 12/10/17  1938     History   Chief Complaint Chief Complaint  Patient presents with  . Dizziness    HPI [name] is a 84 y.o. male.  84 year old male with prior history as documented below presents for evaluation of transient episodes of left sided weakness.  Patient reportedly with multiple episodes of for the last 2 to 3 weeks.  Family reports the patient complains of left arm and left leg numbness and weakness.  It is unclear exactly how long the symptoms last when they come on.  His last episode was this morning.  He will fall when he has an episode of weakness.  Upon arrival to the ED he is at his baseline and is without focal weakness.  He denies associated fever, chest pain, shortness of breath, nausea, vomiting, or other specific acute complaint.  He denies prior history of CVA.  Majority of history obtained from daughter (at bedside).   The history is provided by the patient and a relative.  Weakness  Primary symptoms include focal weakness, loss of sensation, loss of balance. This is a new problem. The current episode started more than 1 week ago. The problem has been resolved. There was no focality noted. There has been no fever. Pertinent negatives include no chest pain.    Past Medical History:  Diagnosis Date  . Adenocarcinoma [facility])    colon s/p right hemicolectomy and chemotherapy. F/U with cancer center and Dr. Lucia Gaskins rotinely  . Eczema   . Epistaxis    f/u per ENT  . Erectile dysfunction   . Gastric ulcer 12/11   H-Pylori Tx, EGD 3-12: gastritis  . Hay fever   . Hypertension   . Hypothyroidism    Hypothyroid  . Insomnia   . Osteopenia     per DEXA 07-2008 (Rx fosamax)  . Rheumatoid arthritis [facility])     Patient Active Problem List   Diagnosis Date Noted  . TIA (transient ischemic attack) 12/10/2017  . Rheumatoid arthritis ([place])   . Chest pain  06/02/2011  . General medical examination 04/11/2011  . PEPTIC ULCER DISEASE, HELICOBACTER PYLORI POSITIVE 07/12/2010  . PERSONAL HISTORY MALIG NEOPLASM LARGE INTESTINE 05/18/2010  . MITRAL VALVE DISORDERS 04/05/2009  . HOARSENESS 12/04/2008  . OSTEOPENIA 08/26/2008  . HYPERTRIGLYCERIDEMIA 07/23/2008  . EPISTAXIS 07/26/2007  . ERECTILE DYSFUNCTION 02/22/2007  . ECZEMA 12/07/2006  . LEG CRAMPS 10/09/2006  . HYPOTHYROIDISM 08/28/2006  . Essential hypertension 08/28/2006  . HAY FEVER 08/28/2006  . INSOMNIA 08/28/2006    Past Surgical History:  Procedure Laterality Date  . COLON SURGERY     Right / Adenocarcinoma / Chemo  . COLONOSCOPY  2005, 2008   Dr. Lucia Gaskins  . INGUINAL HERNIA REPAIR     Right        Home Medications    Prior to Admission medications   Medication Sig Start Date End Date Taking? Authorizing Provider  aspirin 81 MG tablet Take 81 mg by mouth daily.      [provider]  calcium-vitamin D (OSCAL WITH D) 500-200 MG-UNIT per tablet Take 1 tablet by mouth daily.      [provider]  Cholecalciferol (VITAMIN D-1000 MAX ST) 1000 units tablet Take 1,000 mg by mouth 2 (two) times daily.    [provider]  fluocinonide ointment (LIDEX) 0.05 %  07/29/13   [provider]  fluticasone (FLONASE) 50 MCG/ACT  nasal spray 2 sprays by Nasal route daily.      [provider]  folic acid (FOLVITE) 1 MG tablet Take 1 mg by mouth daily.    [provider]  Glucos-MSM-C-Mn-Ginger-Willow (GLUCOSAMINE MSM COMPLEX PO) Take 1,500 mg by mouth daily.    [provider]  hydrochlorothiazide (MICROZIDE) 12.5 MG capsule Take 12.5 mg by mouth daily. 12/05/17   [provider]  levothyroxine (SYNTHROID, LEVOTHROID) 88 MCG tablet Take 88 mcg by mouth daily. 10/20/16 10/20/17  [provider]  loratadine (CLARITIN) 10 MG tablet Take 10 mg by mouth daily.    [provider]  losartan (COZAAR) 25 MG tablet Take  25 mg by mouth daily. 12/05/17   [provider]  losartan-hydrochlorothiazide (HYZAAR) 50-12.5 MG per tablet TAKE ONE TABLET BY MOUTH ONE TIME DAILY 02/23/12   Colon Branch, MD  methotrexate (RHEUMATREX) 2.5 MG tablet Take 15 mg by mouth once a week. 06/10/15   [provider]  multivitamin [facility]) per tablet Take 1 tablet by mouth daily.      [provider]  Omega-3 Fatty Acids (OMEGA 3 PO) Take 1 tablet by mouth daily.      [provider]  omeprazole (PRILOSEC) 40 MG capsule TAKE ONE CAPSULE BY MOUTH ONE TIME DAILY 11/30/11   Colon Branch, MD  predniSONE (DELTASONE) 5 MG tablet Take 5 mg by mouth daily with breakfast.    [provider]  sildenafil (VIAGRA) 100 MG tablet Take 1 tablet (100 mg total) by mouth daily as needed. 11/30/10   Colon Branch, MD  Vitamin D, Ergocalciferol, (DRISDOL) 50000 units CAPS capsule Take 50,000 Units by mouth once a week. 08/25/16   [provider]    Family History Family History  Problem Relation Age of Onset  . Diabetes Neg Hx   . Heart attack Neg Hx   . Colon cancer Neg Hx   . Prostate cancer Neg Hx     Social History Social History   Tobacco Use  . Smoking status: Never Smoker  . Smokeless tobacco: Never Used  Substance Use Topics  . Alcohol use: No    Alcohol/week: 0.0 oz  . Drug use: No     Allergies   Penicillin g   Review of Systems Review of Systems  Cardiovascular: Negative for chest pain.  Neurological: Positive for focal weakness, weakness and loss of balance.  All other systems reviewed and are negative.    Physical Exam Updated Vital Signs BP (!) 155/72   Pulse 68   Temp 98 F (36.7 C)   Resp 14   SpO2 97%   Physical Exam  Constitutional: He is oriented to person, place, and time. He appears well-developed and well-nourished. No distress.  HENT:  Head: Normocephalic and atraumatic.  Mouth/Throat: Oropharynx is clear and moist.  Eyes: Pupils are equal, round,  and reactive to light. Conjunctivae and EOM are normal.  Neck: Normal range of motion. Neck supple.  Cardiovascular: Normal rate, regular rhythm and normal heart sounds.  Pulmonary/Chest: Effort normal and breath sounds normal. No respiratory distress.  Abdominal: Soft. He exhibits no distension. There is no tenderness.  Musculoskeletal: Normal range of motion. He exhibits no edema or deformity.  Neurological: He is alert and oriented to person, place, and time. No cranial nerve deficit.  AOX3 Normal speech No facial droop 5/5 strength to BUE/BLE VAN negative   Skin: Skin is warm and dry.  Psychiatric: He has a normal mood and  affect.  Nursing note and vitals reviewed.    ED Treatments / Results  Labs (all labs ordered are listed, but only abnormal results are displayed) Labs Reviewed  COMPREHENSIVE METABOLIC PANEL - Abnormal; Notable for the following components:      Result Value   Glucose, Bld 106 (*)    GFR calc non Af Amer 58 (*)    All other components within normal limits  I-STAT CHEM 8, ED - Abnormal; Notable for the following components:   Glucose, Bld 105 (*)    Hemoglobin 12.9 (*)    HCT 38.0 (*)    All other components within normal limits  PROTIME-INR  APTT  CBC  DIFFERENTIAL  HEMOGLOBIN A1C  LIPID PANEL  I-STAT TROPONIN, ED  CBG MONITORING, ED    EKG EKG Interpretation  Date/Time:  Monday December 10 2017 19:53:12 EDT Ventricular Rate:  64 PR Interval:  166 QRS Duration: 88 QT Interval:  394 QTC Calculation: 406 R Axis:   14 Text Interpretation:  Normal sinus rhythm Normal ECG Confirmed by Dene Gentry [phone]) on 12/10/2017 10:21:37 PM   Radiology Ct Head Wo Contrast  Result Date: 12/10/2017 CLINICAL DATA:  Dizziness.  Left arm and leg numbness. EXAM: CT HEAD WITHOUT CONTRAST TECHNIQUE: Contiguous axial images were obtained from the base of the skull through the vertex without intravenous contrast. COMPARISON:  11/27/2017 FINDINGS: Brain: There is  atrophy and chronic small vessel disease changes. No acute intracranial abnormality. Specifically, no hemorrhage, hydrocephalus, mass lesion, acute infarction, or significant intracranial injury. Vascular: No hyperdense vessel or unexpected calcification. Skull: No acute calvarial abnormality. Sinuses/Orbits: No acute findings Other: None IMPRESSION: No acute intracranial abnormality. Atrophy, chronic microvascular disease. Electronically Signed   By: Rolm Baptise M.D.   On: 12/10/2017 20:42    Procedures Procedures (including critical care time)  Medications Ordered in ED Medications   stroke: mapping our early stages of recovery book (has no administration in time range)  acetaminophen (TYLENOL) tablet 650 mg (has no administration in time range)    Or  acetaminophen (TYLENOL) solution 650 mg (has no administration in time range)    Or  acetaminophen (TYLENOL) suppository 650 mg (has no administration in time range)  aspirin suppository 300 mg (has no administration in time range)    Or  aspirin tablet 325 mg (has no administration in time range)     Initial Impression / Assessment and Plan / ED Course  I have reviewed the triage vital signs and the nursing notes.  Pertinent labs & imaging results that were available during my care of the patient were reviewed by me and considered in my medical decision making (see chart for details).     MDM Screen complete  Patient is presenting for evaluation of transient episodic left-sided weakness.  Patient is at his neurologic baseline upon my evaluation.  CT imaging does not reveal any acute process.  Other screening labs are also without significant abnormality.  Discussed with Dr. Leonel Ramsay of neurology.  He agrees with need for admission for TIA/CVA work-up.  Hospitalist Shanon Brow) service is aware of case and will evaluate for admission.  Final Clinical Impressions(s) / ED Diagnoses   Final diagnoses:  Weakness    ED Discharge  Orders    None       Valarie Merino, MD 12/10/17 [phone]

## 2017-12-11 ENCOUNTER — Encounter (HOSPITAL_COMMUNITY): Payer: Medicare Other

## 2017-12-11 ENCOUNTER — Observation Stay (HOSPITAL_COMMUNITY): Payer: Medicare Other

## 2017-12-11 ENCOUNTER — Other Ambulatory Visit: Payer: Self-pay

## 2017-12-11 ENCOUNTER — Observation Stay (HOSPITAL_BASED_OUTPATIENT_CLINIC_OR_DEPARTMENT_OTHER): Payer: Medicare Other

## 2017-12-11 DIAGNOSIS — I10 Essential (primary) hypertension: Secondary | ICD-10-CM | POA: Diagnosis present

## 2017-12-11 DIAGNOSIS — M069 Rheumatoid arthritis, unspecified: Secondary | ICD-10-CM | POA: Diagnosis present

## 2017-12-11 DIAGNOSIS — Z79890 Hormone replacement therapy (postmenopausal): Secondary | ICD-10-CM | POA: Diagnosis not present

## 2017-12-11 DIAGNOSIS — Z9049 Acquired absence of other specified parts of digestive tract: Secondary | ICD-10-CM | POA: Diagnosis not present

## 2017-12-11 DIAGNOSIS — R531 Weakness: Secondary | ICD-10-CM | POA: Diagnosis present

## 2017-12-11 DIAGNOSIS — J301 Allergic rhinitis due to pollen: Secondary | ICD-10-CM | POA: Diagnosis present

## 2017-12-11 DIAGNOSIS — I351 Nonrheumatic aortic (valve) insufficiency: Secondary | ICD-10-CM | POA: Diagnosis not present

## 2017-12-11 DIAGNOSIS — Z9221 Personal history of antineoplastic chemotherapy: Secondary | ICD-10-CM | POA: Diagnosis not present

## 2017-12-11 DIAGNOSIS — G459 Transient cerebral ischemic attack, unspecified: Secondary | ICD-10-CM | POA: Diagnosis not present

## 2017-12-11 DIAGNOSIS — I6381 Other cerebral infarction due to occlusion or stenosis of small artery: Secondary | ICD-10-CM | POA: Diagnosis present

## 2017-12-11 DIAGNOSIS — Z79899 Other long term (current) drug therapy: Secondary | ICD-10-CM | POA: Diagnosis not present

## 2017-12-11 DIAGNOSIS — Z7982 Long term (current) use of aspirin: Secondary | ICD-10-CM | POA: Diagnosis not present

## 2017-12-11 DIAGNOSIS — Z85038 Personal history of other malignant neoplasm of large intestine: Secondary | ICD-10-CM | POA: Diagnosis not present

## 2017-12-11 DIAGNOSIS — Z7951 Long term (current) use of inhaled steroids: Secondary | ICD-10-CM | POA: Diagnosis not present

## 2017-12-11 DIAGNOSIS — G4700 Insomnia, unspecified: Secondary | ICD-10-CM | POA: Diagnosis present

## 2017-12-11 DIAGNOSIS — G8194 Hemiplegia, unspecified affecting left nondominant side: Secondary | ICD-10-CM | POA: Diagnosis present

## 2017-12-11 DIAGNOSIS — R296 Repeated falls: Secondary | ICD-10-CM | POA: Diagnosis present

## 2017-12-11 DIAGNOSIS — Z8711 Personal history of peptic ulcer disease: Secondary | ICD-10-CM | POA: Diagnosis not present

## 2017-12-11 DIAGNOSIS — N529 Male erectile dysfunction, unspecified: Secondary | ICD-10-CM | POA: Diagnosis present

## 2017-12-11 DIAGNOSIS — E039 Hypothyroidism, unspecified: Secondary | ICD-10-CM | POA: Diagnosis present

## 2017-12-11 DIAGNOSIS — Z880 Allergy status to penicillin: Secondary | ICD-10-CM | POA: Diagnosis not present

## 2017-12-11 DIAGNOSIS — Z7952 Long term (current) use of systemic steroids: Secondary | ICD-10-CM | POA: Diagnosis not present

## 2017-12-11 LAB — ECHOCARDIOGRAM COMPLETE: Weight: 1957.68 oz

## 2017-12-11 LAB — HEMOGLOBIN A1C
Hgb A1c MFr Bld: 6.3 % — ABNORMAL HIGH (ref 4.8–5.6)
Mean Plasma Glucose: 134.11 mg/dL

## 2017-12-11 LAB — LIPID PANEL
Cholesterol: 155 mg/dL (ref 0–200)
HDL: 34 mg/dL — ABNORMAL LOW (ref >40)
LDL Cholesterol: 49 mg/dL (ref 0–99)
Total CHOL/HDL Ratio: 4.6 RATIO
Triglycerides: 359 mg/dL — ABNORMAL HIGH (ref <150)
VLDL: 72 mg/dL — ABNORMAL HIGH (ref 0–40)

## 2017-12-11 MED ORDER — LEVOTHYROXINE SODIUM 88 MCG PO TABS
88.0000 ug | ORAL_TABLET | Freq: Every day | ORAL | Status: DC
  Administered 2017-12-12 – 2017-12-14 (×3): 88 ug via ORAL
  Filled 2017-12-11 (×3): qty 1

## 2017-12-11 MED ORDER — ASPIRIN EC 81 MG PO TBEC
81.0000 mg | DELAYED_RELEASE_TABLET | Freq: Every day | ORAL | Status: DC
  Administered 2017-12-11 – 2017-12-14 (×4): 81 mg via ORAL
  Filled 2017-12-11 (×4): qty 1

## 2017-12-11 MED ORDER — GEMFIBROZIL 600 MG PO TABS
300.0000 mg | ORAL_TABLET | Freq: Two times a day (BID) | ORAL | Status: DC
  Administered 2017-12-11 – 2017-12-14 (×5): 300 mg via ORAL
  Filled 2017-12-11 (×7): qty 0.5

## 2017-12-11 MED ORDER — PREDNISONE 5 MG PO TABS: 5.0000 mg | ORAL_TABLET | Freq: Every day | ORAL | Status: DC

## 2017-12-11 MED ORDER — CLOPIDOGREL BISULFATE 75 MG PO TABS
75.0000 mg | ORAL_TABLET | Freq: Every day | ORAL | Status: DC
  Administered 2017-12-11 – 2017-12-14 (×4): 75 mg via ORAL
  Filled 2017-12-11 (×4): qty 1

## 2017-12-11 MED ORDER — IOPAMIDOL (ISOVUE-370) INJECTION 76%
50.0000 mL | Freq: Once | INTRAVENOUS | Status: AC | PRN
  Administered 2017-12-11: 50 mL via INTRAVENOUS

## 2017-12-11 MED ORDER — MULTIVITAMINS PO TABS: 1.0000 | ORAL_TABLET | Freq: Every day | ORAL | Status: DC

## 2017-12-11 MED ORDER — PREDNISONE 5 MG PO TABS
5.0000 mg | ORAL_TABLET | Freq: Every day | ORAL | Status: DC
  Administered 2017-12-11 – 2017-12-14 (×3): 5 mg via ORAL
  Filled 2017-12-11 (×3): qty 1

## 2017-12-11 MED ORDER — TAB-A-VITE/IRON PO TABS
1.0000 | ORAL_TABLET | Freq: Every day | ORAL | Status: DC
  Administered 2017-12-11 – 2017-12-14 (×3): 1 via ORAL
  Filled 2017-12-11 (×4): qty 1

## 2017-12-11 NOTE — ED Notes (Signed)
Pt. Ambulated to BR using cane. Pt. Had steady gait

## 2017-12-11 NOTE — Progress Notes (Signed)
  Echocardiogram 2D Echocardiogram has been performed.  [name] 12/11/2017, 9:38 AM

## 2017-12-11 NOTE — ED Notes (Signed)
Meal tray ordered 

## 2017-12-11 NOTE — ED Notes (Signed)
Pt. To CT via stretcher. 

## 2017-12-11 NOTE — Evaluation (Addendum)
Physical Therapy Evaluation Patient Details Name: [name] MRN: [id number] DOB: 1929/05/13 Today's Date: 12/11/2017   History of Present Illness  84 year old male with recurrent episodes of left-sided numbness and weakness  Clinical Impression  Orders received for PT evaluation. Patient demonstrates deficits in functional mobility as indicated below. Will benefit from continued skilled PT to address deficits and maximize function. Will see as indicated and progress as tolerated.  Given history of recurrent events and progressive decline in strength and function, feel patient may benefit from HHPT evaluation and use of RW for activity upon fatigue.    Follow Up Recommendations Home health PT;Supervision for mobility/OOB (youth walker)    Equipment Recommendations  Rolling walker with 5" wheels    Recommendations for Other Services       Precautions / Restrictions Precautions Precautions: Fall      Mobility  Bed Mobility Overal bed mobility: Needs Assistance Bed Mobility: Supine to Sit     Supine to sit: Min assist     General bed mobility comments: min assist to elevate trunk to upright. Increased time and effort to perform  Transfers Overall transfer level: Needs assistance Equipment used: Straight cane Transfers: Sit to/from Stand Sit to Stand: Min guard         General transfer comment: min guard for safety, increased time and effort to perform.   Ambulation/Gait Ambulation/Gait assistance: Min guard Gait Distance (Feet): 210 Feet Assistive device: Straight cane Gait Pattern/deviations: Step-through pattern;Decreased stride length;Shuffle;Narrow base of support Gait velocity: decreased   General Gait Details: patient with very slow cadence and modest instability. No overt LOB  Stairs Stairs: Yes Stairs assistance: Min guard Stair Management: Two rails Number of Stairs: 3 General stair comments: increased time and effort  Wheelchair Mobility     Modified Rankin (Stroke Patients Only) Modified Rankin (Stroke Patients Only) Pre-Morbid Rankin Score: No symptoms Modified Rankin: Moderate disability     Balance Overall balance assessment: Needs assistance;History of Falls                           High level balance activites: Side stepping;Direction changes;Turns;Head turns High Level Balance Comments: min guard to min assist with limited ability to alter or increase speed             Pertinent Vitals/Pain Pain Assessment: No/denies pain    Home Living Family/patient expects to be discharged to:: Private residence Living Arrangements: Spouse/significant other;Children Available Help at Discharge: Family Type of Home: House Home Access: Stairs to enter Entrance Stairs-Rails: None Entrance Stairs-Number of Steps: 3 Home Layout: Multi-level;Able to live on main level with bedroom/bathroom Home Equipment: Kasandra Knudsen - single point      Prior Function                 Hand Dominance   Dominant Hand: Right    Extremity/Trunk Assessment   Upper Extremity Assessment Upper Extremity Assessment: Generalized weakness    Lower Extremity Assessment Lower Extremity Assessment: Generalized weakness(no asymterical weakness noted)       Communication   Communication: Prefers language other than English  Cognition Arousal/Alertness: Awake/alert Behavior During Therapy: WFL for tasks assessed/performed Overall Cognitive Status: No family/caregiver present to determine baseline cognitive functioning                                        General Comments  Exercises     Assessment/Plan    PT Assessment Patient needs continued PT services  PT Problem List Decreased strength;Decreased activity tolerance;Decreased balance;Decreased mobility;Decreased safety awareness       PT Treatment Interventions DME instruction;Gait training;Stair training;Functional mobility  training;Therapeutic activities;Therapeutic exercise;Balance training;Patient/family education    PT Goals (Current goals can be found in the Care Plan section)  Acute Rehab PT Goals Patient Stated Goal: per daughter and patient, to get stronger and move better PT Goal Formulation: With patient/family Time For Goal Achievement: 12/25/17 Potential to Achieve Goals: Good    Frequency Min 3X/week   Barriers to discharge        Co-evaluation               AM-PAC PT "6 Clicks" Daily Activity  Outcome Measure Difficulty turning over in bed (including adjusting bedclothes, sheets and blankets)?: A Little Difficulty moving from lying on back to sitting on the side of the bed? : Unable Difficulty sitting down on and standing up from a chair with arms (e.g., wheelchair, bedside commode, etc,.)?: A Lot Help needed moving to and from a bed to chair (including a wheelchair)?: A Little Help needed walking in hospital room?: A Little Help needed climbing 3-5 steps with a railing? : A Little 6 Click Score: 15    End of Session Equipment Utilized During Treatment: Gait belt Activity Tolerance: Patient tolerated treatment well;Patient limited by fatigue Patient left: in chair;with call bell/phone within reach;with chair alarm set;with family/visitor present Nurse Communication: Mobility status PT Visit Diagnosis: Unsteadiness on feet (R26.81);History of falling (Z91.81)    Time: 4270-6237 PT Time Calculation (min) (ACUTE ONLY): 21 min   Charges:   PT Evaluation $PT Eval Moderate Complexity: 1 Mod     PT G Codes:        Alben Deeds, PT DPT  Board Certified Neurologic Specialist [place] 12/11/2017, 9:11 AM

## 2017-12-11 NOTE — Progress Notes (Signed)
SLP Cancellation Note  Patient Details Name: [name] MRN: [id number] DOB: [birth year]   Cancelled treatment:       Reason Eval/Treat Not Completed: SLP screened, no needs identified, will sign off  Daughter present and SLP provided screen. Pt's cognitive linguistic abilities are at baseline/functional. Additionally, daughter doesn't report any acute deficits. ST can be re-consulted should any further deficits arise.    Claryssa Sandner 12/11/2017, 10:17 AM

## 2017-12-11 NOTE — Progress Notes (Addendum)
STROKE TEAM PROGRESS NOTE   INTERVAL HISTORY His wife and daughter are at the bedside.  He reports the sx that brought him to the hospital has happened exactly the same 5 times in the past. The room is spinning around him, and he gets dizzy - this has been occurring over the past few weeks.  Vitals:   12/11/17 0030 12/11/17 0533 12/11/17 0706 12/11/17 0823  BP: (!) 139/57 (!) 160/58 131/67 (!) 140/57  Pulse: (!) 59 61 (!) 58 (!) 56  Resp: 18 16 20 18   Temp:  97.8 F (36.6 C) 98 F (36.7 C) 97.9 F (36.6 C)  TempSrc:  Oral Oral Oral  SpO2: 97% 96% 95% 96%  Weight:  55.5 kg (122 lb 5.7 oz)      CBC:  Recent Labs  Lab 12/10/17 2012 12/10/17 2024  WBC 7.7  --   NEUTROABS 5.1  --   HGB 13.2 12.9*  HCT 41.4 38.0*  MCV 93.0  --   PLT 215  --     Basic Metabolic Panel:  Recent Labs  Lab 12/10/17 2012 12/10/17 2024  NA 138 139  K 3.8 3.6  CL 99 99  CO2 27  --   GLUCOSE 106* 105*  BUN 19 21  CREATININE 1.11 1.10  CALCIUM 9.7  --    Lipid Panel:     Component Value Date/Time   CHOL 155 12/11/2017 0427   TRIG 359 (H) 12/11/2017 0427   HDL 34 (L) 12/11/2017 0427   CHOLHDL 4.6 12/11/2017 0427   VLDL 72 (H) 12/11/2017 0427   LDLCALC 49 12/11/2017 0427   HgbA1c:  Lab Results  Component Value Date   HGBA1C 6.3 (H) 12/11/2017   Urine Drug Screen: No results found for: LABOPIA, COCAINSCRNUR, LABBENZ, AMPHETMU, THCU, LABBARB  Alcohol Level No results found for: ETH  IMAGING Ct Angio Head W Or Wo Contrast  Result Date: 12/11/2017 CLINICAL DATA:  Initial evaluation for intermittent left-sided weakness for several weeks. EXAM: CT ANGIOGRAPHY HEAD AND NECK TECHNIQUE: Multidetector CT imaging of the head and neck was performed using the standard protocol during bolus administration of intravenous contrast. Multiplanar CT image reconstructions and MIPs were obtained to evaluate the vascular anatomy. Carotid stenosis measurements (when applicable) are obtained utilizing NASCET  criteria, using the distal internal carotid diameter as the denominator. CONTRAST:  74mL ISOVUE-370 IOPAMIDOL (ISOVUE-370) INJECTION 76% COMPARISON:  Prior CT from 12/10/2017 FINDINGS: CTA NECK FINDINGS Aortic arch: Visualized aortic arch of normal caliber with normal 3 vessel morphology. Moderate atheromatous plaque within the visualize arch and about the origin of the great vessels without hemodynamically significant stenosis. Visualized subclavian arteries widely patent. Right carotid system: Atheromatous irregularity within the right common carotid artery without hemodynamically significant stenosis. No significant atheromatous narrowing about the right carotid bifurcation. Right ICA widely patent distally to the skull base without stenosis, dissection, or occlusion. Left carotid system: Left common carotid artery patent from its origin to the bifurcation without hemodynamically significant stenosis. No significant atheromatous narrowing about the left bifurcation. Left ICA patent from the bifurcation to the skull base without stenosis, dissection, or occlusion. Vertebral arteries: Both of the vertebral arteries arise from the subclavian arteries. Left vertebral artery dominant. Atheromatous stenoses with up to approximate 50% narrowing at the origin of the left vertebral artery. Moderate to severe tandem stenoses noted within the pre foraminal right V1 segment (series 8, images 259, 245). Vertebral arteries otherwise patent within the neck without high-grade stenosis or occlusion. Skeleton: No acute  osseus abnormality. No discrete lytic or blastic osseous lesions. Mild multilevel cervical spondylolysis and facet arthrosis noted within the cervical spine. Other neck: No acute soft tissue abnormality within the neck. No adenopathy. Salivary glands normal. Thyroid appears to be absent. Upper chest: Visualized upper chest demonstrates no acute finding. Partially visualized lungs are clear. Review of the MIP images  confirms the above findings CTA HEAD FINDINGS Anterior circulation: Petrous segments widely patent bilaterally. Mild atheromatous plaque within the cavernous/para clinoid ICAs without hemodynamically significant stenosis. ICA termini widely patent. A1 segments patent bilaterally. Right A1 slightly hypoplastic. Anterior communicating artery patent and normal. Anterior cerebral arteries widely patent to their distal aspects without stenosis. M1 segments mildly irregular but widely patent bilaterally without stenosis. Normal MCA bifurcations. No proximal M2 occlusion distal MCA branches well opacified and symmetric Posterior circulation: Left vertebral artery dominant. There are severe tandem near occlusive stenoses involving the left V4 segment. The hypoplastic right vertebral artery is markedly irregular and attenuated as it courses into the cranial vault, and occludes just beyond the takeoff of the right posterior inferior cerebral artery. Posterior inferior cerebral arteries themselves are patent bilaterally. Basilar artery is diffusely narrowed and somewhat attenuated with multifocal atheromatous irregularity, but is patent to its distal aspect. Superior cerebral arteries patent bilaterally. Right PCA supplied via the basilar as well as a small right posterior communicating artery. Predominant fetal type origin of the left PCA, although a small hypoplastic left P1 segment is visualized. PCAs patent to their distal aspects, although demonstrate distal small vessel atheromatous irregularity. Venous sinuses: Patent. Anatomic variants: Predominant fetal type origin of the left PCA. Delayed phase: No abnormal enhancement. Review of the MIP images confirms the above findings IMPRESSION: 1. Negative CTA for large vessel occlusion. 2. 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 Hypoplastic right V4 segment occludes prior to the vertebrobasilar  junction. Basilar artery diffusely narrowed with multifocal atheromatous irregularity. PCAs are well perfused, with predominant fetal type origin of the left PCA, with a fairly robust right posterior communicating artery as well. 3. Additional moderate to advanced stenoses involving the proximal V1 segments as above. 4. Additional mild for age atherosclerotic change about the carotid bifurcations and carotid siphons without hemodynamically significant stenosis. Electronically Signed   By: Jeannine Boga M.D.   On: 12/11/2017 03:18   Ct Head Wo Contrast  Result Date: 12/10/2017 CLINICAL DATA:  Dizziness.  Left arm and leg numbness. EXAM: CT HEAD WITHOUT CONTRAST TECHNIQUE: Contiguous axial images were obtained from the base of the skull through the vertex without intravenous contrast. COMPARISON:  11/27/2017 FINDINGS: Brain: There is atrophy and chronic small vessel disease changes. No acute intracranial abnormality. Specifically, no hemorrhage, hydrocephalus, mass lesion, acute infarction, or significant intracranial injury. Vascular: No hyperdense vessel or unexpected calcification. Skull: No acute calvarial abnormality. Sinuses/Orbits: No acute findings Other: None IMPRESSION: No acute intracranial abnormality. Atrophy, chronic microvascular disease. Electronically Signed   By: Rolm Baptise M.D.   On: 12/10/2017 20:42   Ct Angio Neck W Or Wo Contrast  Result Date: 12/11/2017 CLINICAL DATA:  Initial evaluation for intermittent left-sided weakness for several weeks. EXAM: CT ANGIOGRAPHY HEAD AND NECK TECHNIQUE: Multidetector CT imaging of the head and neck was performed using the standard protocol during bolus administration of intravenous contrast. Multiplanar CT image reconstructions and MIPs were obtained to evaluate the vascular anatomy. Carotid stenosis measurements (when applicable) are obtained utilizing NASCET criteria, using the distal internal carotid diameter as the  denominator. CONTRAST:   69mL ISOVUE-370 IOPAMIDOL (ISOVUE-370) INJECTION 76% COMPARISON:  Prior CT from 12/10/2017 FINDINGS: CTA NECK FINDINGS Aortic arch: Visualized aortic arch of normal caliber with normal 3 vessel morphology. Moderate atheromatous plaque within the visualize arch and about the origin of the great vessels without hemodynamically significant stenosis. Visualized subclavian arteries widely patent. Right carotid system: Atheromatous irregularity within the right common carotid artery without hemodynamically significant stenosis. No significant atheromatous narrowing about the right carotid bifurcation. Right ICA widely patent distally to the skull base without stenosis, dissection, or occlusion. Left carotid system: Left common carotid artery patent from its origin to the bifurcation without hemodynamically significant stenosis. No significant atheromatous narrowing about the left bifurcation. Left ICA patent from the bifurcation to the skull base without stenosis, dissection, or occlusion. Vertebral arteries: Both of the vertebral arteries arise from the subclavian arteries. Left vertebral artery dominant. Atheromatous stenoses with up to approximate 50% narrowing at the origin of the left vertebral artery. Moderate to severe tandem stenoses noted within the pre foraminal right V1 segment (series 8, images 259, 245). Vertebral arteries otherwise patent within the neck without high-grade stenosis or occlusion. Skeleton: No acute osseus abnormality. No discrete lytic or blastic osseous lesions. Mild multilevel cervical spondylolysis and facet arthrosis noted within the cervical spine. Other neck: No acute soft tissue abnormality within the neck. No adenopathy. Salivary glands normal. Thyroid appears to be absent. Upper chest: Visualized upper chest demonstrates no acute finding. Partially visualized lungs are clear. Review of the MIP images confirms the above findings CTA HEAD FINDINGS Anterior circulation: Petrous segments  widely patent bilaterally. Mild atheromatous plaque within the cavernous/para clinoid ICAs without hemodynamically significant stenosis. ICA termini widely patent. A1 segments patent bilaterally. Right A1 slightly hypoplastic. Anterior communicating artery patent and normal. Anterior cerebral arteries widely patent to their distal aspects without stenosis. M1 segments mildly irregular but widely patent bilaterally without stenosis. Normal MCA bifurcations. No proximal M2 occlusion distal MCA branches well opacified and symmetric Posterior circulation: Left vertebral artery dominant. There are severe tandem near occlusive stenoses involving the left V4 segment. The hypoplastic right vertebral artery is markedly irregular and attenuated as it courses into the cranial vault, and occludes just beyond the takeoff of the right posterior inferior cerebral artery. Posterior inferior cerebral arteries themselves are patent bilaterally. Basilar artery is diffusely narrowed and somewhat attenuated with multifocal atheromatous irregularity, but is patent to its distal aspect. Superior cerebral arteries patent bilaterally. Right PCA supplied via the basilar as well as a small right posterior communicating artery. Predominant fetal type origin of the left PCA, although a small hypoplastic left P1 segment is visualized. PCAs patent to their distal aspects, although demonstrate distal small vessel atheromatous irregularity. Venous sinuses: Patent. Anatomic variants: Predominant fetal type origin of the left PCA. Delayed phase: No abnormal enhancement. Review of the MIP images confirms the above findings IMPRESSION: 1. Negative CTA for large vessel occlusion. 2. 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 Hypoplastic right V4 segment occludes prior to the vertebrobasilar junction. Basilar artery diffusely narrowed with multifocal atheromatous irregularity.  PCAs are well perfused, with predominant fetal type origin of the left PCA, with a fairly robust right posterior communicating artery as well. 3. Additional moderate to advanced stenoses involving the proximal V1 segments as above. 4. Additional mild for age atherosclerotic change about the carotid bifurcations and carotid siphons without hemodynamically significant stenosis. Electronically Signed   By: Pincus Badder.D.  On: 12/11/2017 03:18   Mr Brain Wo Contrast  Result Date: 12/11/2017 CLINICAL DATA:  84 year old male with waxing and waning left side numbness and weakness. CTA head and neck earlier today positive for severe stenosis, near occlusion of the dominant left vertebral artery. EXAM: MRI HEAD WITHOUT CONTRAST TECHNIQUE: Multiplanar, multiecho pulse sequences of the brain and surrounding structures were obtained without intravenous contrast. COMPARISON:  CTA head and neck 0119 hours today. Brain MRI 11/18/2012. FINDINGS: Brain: Patchy and confluent restricted diffusion in both cerebellar hemispheres, more extensive on the right (series 3, image 6). The PICA territories are primarily affected. There is borderline involvement of the right SCA territory. There is also a punctate focus of restricted diffusion in the right dorsal pons (series 3, image 12). Mild associated T2 and FLAIR hyperintensity in the in the acutely affected areas with no hemorrhage or mass effect. No other No restricted diffusion or evidence of acute infarction. No acute or chronic cerebral blood products identified. Moderate chronic scattered bilateral cerebral white matter T2 and FLAIR hyperintensity has mildly progressed since 2014. No midline shift, mass effect, evidence of mass lesion, ventriculomegaly, extra-axial collection or acute intracranial hemorrhage. Cervicomedullary junction and pituitary are within normal limits. Vascular: Major intracranial vascular flow voids are stable since 2014. Skull and upper cervical  spine: Degenerative ligamentous hypertrophy about the odontoid. Stable visible cervical spine and osseous structures. Sinuses/Orbits: Stable and negative. Other: Mastoids are clear. Visible internal auditory structures appear normal. Scalp and face soft tissues are negative. IMPRESSION: 1. 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 No associated hemorrhage or mass effect. 2. Chronic supratentorial signal changes suggestive of small vessel disease have mildly progressed since 2014. Electronically Signed   By: Genevie Ann M.D.   On: 12/11/2017 13:14   2D Echocardiogram  pending  PHYSICAL EXAM Pleasant elderly Asian male not in distress. . Afebrile. Head is nontraumatic. Neck is supple without bruit.    Cardiac exam no murmur or gallop. Lungs are clear to auscultation. Distal pulses are well felt. Neurological Exam ;  Awake  Alert oriented x 3. Normal speech and language.eye movements full without nystagmus.fundi were not visualized. Vision acuity and fields appear normal. Hearing is normal. Palatal movements are normal. Face symmetric. Tongue midline. Normal strength, tone, reflexes and coordination. Normal sensation. Gait deferred.  ASSESSMENT/PLAN [name] is a 84 y.o. [name] with history of HTN and colon cancer presenting with intermittent L hemiparesis that worsens with dizziness for the past several weeks.  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 Small vessel disease. Atrophy.   CTA head & neck no LVO. 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 Given mild stroke, will place on aspirin 81 mg and plavix 75 mg daily x 3 weeks, then Plavix alone   Therapy recommendations:  HH PT, RW w/ 5" wheels, no OT or SLP  Disposition:  Return home with family  Hypertension  Stable . Permissive hypertension (OK if <  220/120) but gradually normalize in 5-7 days . Long-term BP goal normotensive  Other Stroke Risk Factors  Advanced age  Hospital day # 0  Burnetta Sabin, MSN, APRN, ANVP-BC, AGPCNP-BC Advanced Practice Stroke Nurse [place] for Schedule & Pager information 12/11/2017 10:37 AM  I have personally examined this patient, reviewed notes, independently viewed imaging studies, participated in medical decision making and plan of care.ROS completed by me personally and pertinent positives fully documented  I have made any additions or clarifications directly to the above note. Agree with note above.  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  Recommend dual antiplatelet therapy for 3 months and aggressive risk factor modification.  Continue ongoing stroke work-up.  Long discussion with the patient, his wife and friend and answered questions.  Discussed with Dr. Tyrell Antonio.  Greater than 50% time during this 35-minute visit was spent in counseling and coordination of care about his brainstem and cerebral strokes, basilar stenosis and answering questions  Antony Contras, [place] Pager: [phone] 12/11/2017 4:23 PM  To contact Stroke Continuity provider, please refer to [email or web address] After hours, contact General Neurology

## 2017-12-11 NOTE — Progress Notes (Addendum)
PROGRESS NOTE    [name]  NKN:[id number] DOB: 08-13-1928 DOA: 12/10/2017 PCP: No primary care provider on file.    Brief Narrative: [name] is a 84 y.o. male with medical history significant of hypertension, rheumatoid arthritis comes in with 3 weeks of waxing and waning left-sided numbness and weakness that has been occurring on and off.  He reports that he gets very numb the whole entire left side he cannot move it which is been causing frequent falls.  He currently denies any symptoms.  He denies any fevers.  He denies any headaches.  He does not have a history of previous stroke.  Patient take a daily aspirin a day.  Patient is being referred for admission for TIA work-up.   Assessment & Plan:   Principal Problem:   TIA (transient ischemic attack) Active Problems:   Essential hypertension   1-Acute stroke; both cerebral hemispheres -LDL 49, triglyceride 359, EKG; sinus.  HB A1c 6.3.  -CT angio head and neck: Negative CTA for large vessel occlusion. 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 Hypoplastic right V4 segment occludes prior to the vertebrobasilar junction. Basilar artery diffusely narrowed with multifocal atheromatous irregularity. PCAs are well perfused, with predominant fetal type origin of the left PCA, with a fairly robust right posterior communicating artery as well. -MRI; Scattered small acute infarcts in both cerebral hemispheres, and the dorsal right pons. No associated hemorrhage or mass effect. Chronic supratentorial signal changes suggestive of small vessel disease have mildly progressed since 2014. -Discussed with Dr Leonie Man, start aspirin , plavix.  -Permissive HTN.  -start lopid  -ECHO pending   HTN; permissive HTN, hold cozaar, HCTZ.   RA; resume prednisone.   Hypothyroidism; resume synthroid,.      DVT prophylaxis: SCD Code Status: full code.  Family Communication:  wife at bedside.  Disposition Plan: follow PT rec   Consultants:   Neurology    Procedures:   ECHO   Antimicrobials: none  Subjective: He is feeling ok, he gets dizzy, weak.    Objective: Vitals:   12/11/17 0030 12/11/17 0533 12/11/17 0706 12/11/17 0823  BP: (!) 139/57 (!) 160/58 131/67 (!) 140/57  Pulse: (!) 59 61 (!) 58 (!) 56  Resp: 18 16 20 18   Temp:  97.8 F (36.6 C) 98 F (36.7 C) 97.9 F (36.6 C)  TempSrc:  Oral Oral Oral  SpO2: 97% 96% 95% 96%  Weight:  55.5 kg (122 lb 5.7 oz)     No intake or output data in the 24 hours ending 12/11/17 1208 Filed Weights   12/11/17 0533  Weight: 55.5 kg (122 lb 5.7 oz)    Examination:  General exam: Appears calm and comfortable  Respiratory system: Clear to auscultation. Respiratory effort normal. Cardiovascular system: S1 & S2 heard, RRR. No JVD, murmurs, rubs, gallops or clicks. No pedal edema. Gastrointestinal system: Abdomen is nondistended, soft and nontender. No organomegaly or masses felt. Normal bowel sounds heard. Central nervous system: Alert and oriented.  Extremities: Symmetric 5 x 5 power. Skin: No rashes, lesions or ulcers     Data Reviewed: I have personally reviewed following labs and imaging studies  CBC: Recent Labs  Lab 12/10/17 2012 12/10/17 2024  WBC 7.7  --   NEUTROABS 5.1  --   HGB 13.2 12.9*  HCT 41.4 38.0*  MCV 93.0  --   PLT 215  --    Basic Metabolic Panel: Recent Labs  Lab 12/10/17 2012  12/10/17 2024  NA 138 139  K 3.8 3.6  CL 99 99  CO2 27  --   GLUCOSE 106* 105*  BUN 19 21  CREATININE 1.11 1.10  CALCIUM 9.7  --    GFR: Estimated Creatinine Clearance: 35 mL/min (by C-G formula based on SCr of 1.1 mg/dL). Liver Function Tests: Recent Labs  Lab 12/10/17 2012  AST 26  ALT 18  ALKPHOS 67  BILITOT 0.5  PROT 7.2  ALBUMIN 3.8   No results for input(s): LIPASE, AMYLASE in the last 168 hours. No results for input(s): AMMONIA in the last 168 hours. Coagulation  Profile: Recent Labs  Lab 12/10/17 2012  INR 1.01   Cardiac Enzymes: No results for input(s): CKTOTAL, CKMB, CKMBINDEX, TROPONINI in the last 168 hours. BNP (last 3 results) No results for input(s): PROBNP in the last 8760 hours. HbA1C: Recent Labs    12/11/17 0427  HGBA1C 6.3*   CBG: No results for input(s): GLUCAP in the last 168 hours. Lipid Profile: Recent Labs    12/11/17 0427  CHOL 155  HDL 34*  LDLCALC 49  TRIG 359*  CHOLHDL 4.6   Thyroid Function Tests: No results for input(s): TSH, T4TOTAL, FREET4, T3FREE, THYROIDAB in the last 72 hours. Anemia Panel: No results for input(s): VITAMINB12, FOLATE, FERRITIN, TIBC, IRON, RETICCTPCT in the last 72 hours. Sepsis Labs: No results for input(s): PROCALCITON, LATICACIDVEN in the last 168 hours.  No results found for this or any previous visit (from the past 240 hour(s)).       Radiology Studies: Ct Angio Head W Or Wo Contrast  Result Date: 12/11/2017 CLINICAL DATA:  Initial evaluation for intermittent left-sided weakness for several weeks. EXAM: CT ANGIOGRAPHY HEAD AND NECK TECHNIQUE: Multidetector CT imaging of the head and neck was performed using the standard protocol during bolus administration of intravenous contrast. Multiplanar CT image reconstructions and MIPs were obtained to evaluate the vascular anatomy. Carotid stenosis measurements (when applicable) are obtained utilizing NASCET criteria, using the distal internal carotid diameter as the denominator. CONTRAST:  60mL ISOVUE-370 IOPAMIDOL (ISOVUE-370) INJECTION 76% COMPARISON:  Prior CT from 12/10/2017 FINDINGS: CTA NECK FINDINGS Aortic arch: Visualized aortic arch of normal caliber with normal 3 vessel morphology. Moderate atheromatous plaque within the visualize arch and about the origin of the great vessels without hemodynamically significant stenosis. Visualized subclavian arteries widely patent. Right carotid system: Atheromatous irregularity within the  right common carotid artery without hemodynamically significant stenosis. No significant atheromatous narrowing about the right carotid bifurcation. Right ICA widely patent distally to the skull base without stenosis, dissection, or occlusion. Left carotid system: Left common carotid artery patent from its origin to the bifurcation without hemodynamically significant stenosis. No significant atheromatous narrowing about the left bifurcation. Left ICA patent from the bifurcation to the skull base without stenosis, dissection, or occlusion. Vertebral arteries: Both of the vertebral arteries arise from the subclavian arteries. Left vertebral artery dominant. Atheromatous stenoses with up to approximate 50% narrowing at the origin of the left vertebral artery. Moderate to severe tandem stenoses noted within the pre foraminal right V1 segment (series 8, images 259, 245). Vertebral arteries otherwise patent within the neck without high-grade stenosis or occlusion. Skeleton: No acute osseus abnormality. No discrete lytic or blastic osseous lesions. Mild multilevel cervical spondylolysis and facet arthrosis noted within the cervical spine. Other neck: No acute soft tissue abnormality within the neck. No adenopathy. Salivary glands normal. Thyroid appears to be absent. Upper chest: Visualized upper chest demonstrates no acute finding.  Partially visualized lungs are clear. Review of the MIP images confirms the above findings CTA HEAD FINDINGS Anterior circulation: Petrous segments widely patent bilaterally. Mild atheromatous plaque within the cavernous/para clinoid ICAs without hemodynamically significant stenosis. ICA termini widely patent. A1 segments patent bilaterally. Right A1 slightly hypoplastic. Anterior communicating artery patent and normal. Anterior cerebral arteries widely patent to their distal aspects without stenosis. M1 segments mildly irregular but widely patent bilaterally without stenosis. Normal MCA  bifurcations. No proximal M2 occlusion distal MCA branches well opacified and symmetric Posterior circulation: Left vertebral artery dominant. There are severe tandem near occlusive stenoses involving the left V4 segment. The hypoplastic right vertebral artery is markedly irregular and attenuated as it courses into the cranial vault, and occludes just beyond the takeoff of the right posterior inferior cerebral artery. Posterior inferior cerebral arteries themselves are patent bilaterally. Basilar artery is diffusely narrowed and somewhat attenuated with multifocal atheromatous irregularity, but is patent to its distal aspect. Superior cerebral arteries patent bilaterally. Right PCA supplied via the basilar as well as a small right posterior communicating artery. Predominant fetal type origin of the left PCA, although a small hypoplastic left P1 segment is visualized. PCAs patent to their distal aspects, although demonstrate distal small vessel atheromatous irregularity. Venous sinuses: Patent. Anatomic variants: Predominant fetal type origin of the left PCA. Delayed phase: No abnormal enhancement. Review of the MIP images confirms the above findings IMPRESSION: 1. Negative CTA for large vessel occlusion. 2. 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 Hypoplastic right V4 segment occludes prior to the vertebrobasilar junction. Basilar artery diffusely narrowed with multifocal atheromatous irregularity. PCAs are well perfused, with predominant fetal type origin of the left PCA, with a fairly robust right posterior communicating artery as well. 3. Additional moderate to advanced stenoses involving the proximal V1 segments as above. 4. Additional mild for age atherosclerotic change about the carotid bifurcations and carotid siphons without hemodynamically significant stenosis. Electronically Signed   By: Jeannine Boga M.D.   On:  12/11/2017 03:18   Ct Head Wo Contrast  Result Date: 12/10/2017 CLINICAL DATA:  Dizziness.  Left arm and leg numbness. EXAM: CT HEAD WITHOUT CONTRAST TECHNIQUE: Contiguous axial images were obtained from the base of the skull through the vertex without intravenous contrast. COMPARISON:  11/27/2017 FINDINGS: Brain: There is atrophy and chronic small vessel disease changes. No acute intracranial abnormality. Specifically, no hemorrhage, hydrocephalus, mass lesion, acute infarction, or significant intracranial injury. Vascular: No hyperdense vessel or unexpected calcification. Skull: No acute calvarial abnormality. Sinuses/Orbits: No acute findings Other: None IMPRESSION: No acute intracranial abnormality. Atrophy, chronic microvascular disease. Electronically Signed   By: Rolm Baptise M.D.   On: 12/10/2017 20:42   Ct Angio Neck W Or Wo Contrast  Result Date: 12/11/2017 CLINICAL DATA:  Initial evaluation for intermittent left-sided weakness for several weeks. EXAM: CT ANGIOGRAPHY HEAD AND NECK TECHNIQUE: Multidetector CT imaging of the head and neck was performed using the standard protocol during bolus administration of intravenous contrast. Multiplanar CT image reconstructions and MIPs were obtained to evaluate the vascular anatomy. Carotid stenosis measurements (when applicable) are obtained utilizing NASCET criteria, using the distal internal carotid diameter as the denominator. CONTRAST:  72mL ISOVUE-370 IOPAMIDOL (ISOVUE-370) INJECTION 76% COMPARISON:  Prior CT from 12/10/2017 FINDINGS: CTA NECK FINDINGS Aortic arch: Visualized aortic arch of normal caliber with normal 3 vessel morphology. Moderate atheromatous plaque within the visualize arch and about the origin of the great vessels without hemodynamically significant  stenosis. Visualized subclavian arteries widely patent. Right carotid system: Atheromatous irregularity within the right common carotid artery without hemodynamically significant stenosis.  No significant atheromatous narrowing about the right carotid bifurcation. Right ICA widely patent distally to the skull base without stenosis, dissection, or occlusion. Left carotid system: Left common carotid artery patent from its origin to the bifurcation without hemodynamically significant stenosis. No significant atheromatous narrowing about the left bifurcation. Left ICA patent from the bifurcation to the skull base without stenosis, dissection, or occlusion. Vertebral arteries: Both of the vertebral arteries arise from the subclavian arteries. Left vertebral artery dominant. Atheromatous stenoses with up to approximate 50% narrowing at the origin of the left vertebral artery. Moderate to severe tandem stenoses noted within the pre foraminal right V1 segment (series 8, images 259, 245). Vertebral arteries otherwise patent within the neck without high-grade stenosis or occlusion. Skeleton: No acute osseus abnormality. No discrete lytic or blastic osseous lesions. Mild multilevel cervical spondylolysis and facet arthrosis noted within the cervical spine. Other neck: No acute soft tissue abnormality within the neck. No adenopathy. Salivary glands normal. Thyroid appears to be absent. Upper chest: Visualized upper chest demonstrates no acute finding. Partially visualized lungs are clear. Review of the MIP images confirms the above findings CTA HEAD FINDINGS Anterior circulation: Petrous segments widely patent bilaterally. Mild atheromatous plaque within the cavernous/para clinoid ICAs without hemodynamically significant stenosis. ICA termini widely patent. A1 segments patent bilaterally. Right A1 slightly hypoplastic. Anterior communicating artery patent and normal. Anterior cerebral arteries widely patent to their distal aspects without stenosis. M1 segments mildly irregular but widely patent bilaterally without stenosis. Normal MCA bifurcations. No proximal M2 occlusion distal MCA branches well opacified and  symmetric Posterior circulation: Left vertebral artery dominant. There are severe tandem near occlusive stenoses involving the left V4 segment. The hypoplastic right vertebral artery is markedly irregular and attenuated as it courses into the cranial vault, and occludes just beyond the takeoff of the right posterior inferior cerebral artery. Posterior inferior cerebral arteries themselves are patent bilaterally. Basilar artery is diffusely narrowed and somewhat attenuated with multifocal atheromatous irregularity, but is patent to its distal aspect. Superior cerebral arteries patent bilaterally. Right PCA supplied via the basilar as well as a small right posterior communicating artery. Predominant fetal type origin of the left PCA, although a small hypoplastic left P1 segment is visualized. PCAs patent to their distal aspects, although demonstrate distal small vessel atheromatous irregularity. Venous sinuses: Patent. Anatomic variants: Predominant fetal type origin of the left PCA. Delayed phase: No abnormal enhancement. Review of the MIP images confirms the above findings IMPRESSION: 1. Negative CTA for large vessel occlusion. 2. 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 Hypoplastic right V4 segment occludes prior to the vertebrobasilar junction. Basilar artery diffusely narrowed with multifocal atheromatous irregularity. PCAs are well perfused, with predominant fetal type origin of the left PCA, with a fairly robust right posterior communicating artery as well. 3. Additional moderate to advanced stenoses involving the proximal V1 segments as above. 4. Additional mild for age atherosclerotic change about the carotid bifurcations and carotid siphons without hemodynamically significant stenosis. Electronically Signed   By: Jeannine Boga M.D.   On: 12/11/2017 03:18        Scheduled Meds: .  stroke: mapping our early stages of  recovery book   Does not apply Once  . aspirin EC  81 mg Oral Daily   Continuous Infusions:   LOS: 0 days  Time spent: 35 minutes.     Elmarie Shiley, MD [facility] Pager [phone]  If 7PM-7AM, please contact night-coverage [email or web address] Password [facility] 12/11/2017, 12:08 PM

## 2017-12-11 NOTE — ED Notes (Signed)
Requested hospital bed from [facility] 

## 2017-12-11 NOTE — Evaluation (Signed)
Occupational Therapy Evaluation Patient Details Name: [name] MRN: [id number] DOB: 06-17-1928 Today's Date: 12/11/2017    History of Present Illness 84 year old male with recurrent episodes of left-sided numbness and weakness  Past Medical History:  Diagnosis Date  . Adenocarcinoma [facility])    colon s/p right hemicolectomy and chemotherapy. F/U with cancer center and Dr. Lucia Gaskins rotinely  . Eczema   . Epistaxis    f/u per ENT  . Erectile dysfunction   . Gastric ulcer 12/11   H-Pylori Tx, EGD 3-12: gastritis  . Hay fever   . Hypertension   . Hypothyroidism    Hypothyroid  . Insomnia   . Osteopenia     per DEXA 07-2008 (Rx fosamax)  . Rheumatoid arthritis ([place])       Clinical Impression   Patient evaluated by Occupational Therapy with no further acute OT needs identified. All education has been completed and the patient has no further questions. See below for any follow-up Occupational Therapy or equipment needs. OT to sign off. Thank you for referral.      Follow Up Recommendations  No OT follow up    Equipment Recommendations  3 in 1 bedside commode    Recommendations for Other Services       Precautions / Restrictions Precautions Precautions: Fall      Mobility Bed Mobility Overal bed mobility: Needs Assistance Bed Mobility: Supine to Sit     Supine to sit: Min assist     General bed mobility comments: in chair on arrival  Transfers Overall transfer level: Modified independent Equipment used: Straight cane Transfers: Sit to/from Stand Sit to Stand: Min guard         General transfer comment: min guard for safety, increased time and effort to perform.     Balance Overall balance assessment: Needs assistance;History of Falls                           High level balance activites: Side stepping;Direction changes;Turns;Head turns High Level Balance Comments: min guard to min assist with limited ability to alter or increase speed           ADL either performed or assessed with clinical judgement   ADL Overall ADL's : Modified independent                                       General ADL Comments: educated on 3n1 transfer and 3n1 over toilet. pt given handout with therapy department number and 3n1 over tub transfer education. daughter requesting 3n1 education but not present at the time of eval     Vision Baseline Vision/History: Wears glasses Wears Glasses: At all times Patient Visual Report: Other (comment) Additional Comments: pt reports diplopia at times prior to admission but denies diplopia during session.      Perception     Praxis      Pertinent Vitals/Pain Pain Assessment: No/denies pain     Hand Dominance Right   Extremity/Trunk Assessment Upper Extremity Assessment Upper Extremity Assessment: Generalized weakness   Lower Extremity Assessment Lower Extremity Assessment: Defer to PT evaluation   Cervical / Trunk Assessment Cervical / Trunk Assessment: Normal   Communication Communication Communication: Prefers language other than English(communicating in [place] with thearpist)   Cognition Arousal/Alertness: Awake/alert Behavior During Therapy: WFL for tasks assessed/performed Overall Cognitive Status: Within Functional Limits for tasks assessed  General Comments       Exercises     Shoulder Instructions      Home Living Family/patient expects to be discharged to:: Private residence Living Arrangements: Spouse/significant other;Children Available Help at Discharge: Family Type of Home: House Home Access: Stairs to enter Technical brewer of Steps: 3 Entrance Stairs-Rails: None Home Layout: Multi-level;Able to live on main level with bedroom/bathroom     Bathroom Shower/Tub: Teacher, early years/pre: Standard     Home Equipment: Cane - single point   Additional Comments: wife present during  eval does not speak english      Prior Functioning/Environment Level of Independence: Independent                 OT Problem List:        OT Treatment/Interventions:      OT Goals(Current goals can be found in the care plan section) Acute Rehab OT Goals Patient Stated Goal: per daughter for patient to use the 3n1 OT Goal Formulation: With patient/family Potential to Achieve Goals: Good  OT Frequency:     Barriers to D/C:            Co-evaluation              AM-PAC PT "6 Clicks" Daily Activity     Outcome Measure Help from another person eating meals?: None Help from another person taking care of personal grooming?: None Help from another person toileting, which includes using toliet, bedpan, or urinal?: None Help from another person bathing (including washing, rinsing, drying)?: None Help from another person to put on and taking off regular upper body clothing?: None Help from another person to put on and taking off regular lower body clothing?: None 6 Click Score: 24   End of Session Nurse Communication: Mobility status;Precautions  Activity Tolerance: Patient tolerated treatment well Patient left: in chair;with call bell/phone within reach;with family/visitor present  OT Visit Diagnosis: Unsteadiness on feet (R26.81)                Time: 7564-3329 OT Time Calculation (min): 15 min Charges:  OT General Charges $OT Visit: 1 Visit OT Evaluation $OT Eval Low Complexity: 1 Low G-CodesJeri Modena   OTR/L Pager: [phone] Office: [phone] .   Parke Poisson B 12/11/2017, 10:40 AM

## 2017-12-12 MED ORDER — GEMFIBROZIL 600 MG PO TABS: 300.0000 mg | ORAL_TABLET | Freq: Two times a day (BID) | ORAL | 0 refills | Status: DC

## 2017-12-12 MED ORDER — CLOPIDOGREL BISULFATE 75 MG PO TABS: 75.0000 mg | ORAL_TABLET | Freq: Every day | ORAL | 0 refills | Status: DC

## 2017-12-12 NOTE — Progress Notes (Signed)
STROKE TEAM PROGRESS NOTE   INTERVAL HISTORY His  Daughter and Dr Tyrell Antonio are at the bedside.  He reports he had another transient episode at 10:30 AM today when he had right-sided numbness and weakness lasting only a few minutes but recovered. His daughter was at the bedside and witnessed at He states thatl has happened exactly the same 6 times in the past. The room is spinning around him, and he gets dizzy - this has been occurring over the past few weeks.  Vitals:   12/11/17 2000 12/11/17 2320 12/12/17 0846 12/12/17 1233  BP: 132/63 (!) 114/49 139/65 (!) 129/52  Pulse: 70 63 67 70  Resp: 18 18 20 20   Temp: 98.6 F (37 C) 98 F (36.7 C) 97.9 F (36.6 C)   TempSrc: Oral Oral Oral   SpO2: 98% 98% 97% 97%  Weight:        CBC:  Recent Labs  Lab 12/10/17 2012 12/10/17 2024  WBC 7.7  --   NEUTROABS 5.1  --   HGB 13.2 12.9*  HCT 41.4 38.0*  MCV 93.0  --   PLT 215  --     Basic Metabolic Panel:  Recent Labs  Lab 12/10/17 2012 12/10/17 2024  NA 138 139  K 3.8 3.6  CL 99 99  CO2 27  --   GLUCOSE 106* 105*  BUN 19 21  CREATININE 1.11 1.10  CALCIUM 9.7  --    Lipid Panel:     Component Value Date/Time   CHOL 155 12/11/2017 0427   TRIG 359 (H) 12/11/2017 0427   HDL 34 (L) 12/11/2017 0427   CHOLHDL 4.6 12/11/2017 0427   VLDL 72 (H) 12/11/2017 0427   LDLCALC 49 12/11/2017 0427   HgbA1c:  Lab Results  Component Value Date   HGBA1C 6.3 (H) 12/11/2017   Urine Drug Screen: No results found for: LABOPIA, COCAINSCRNUR, LABBENZ, AMPHETMU, THCU, LABBARB  Alcohol Level No results found for: ETH  IMAGING Ct Angio Head W Or Lindsey Contrast  Result Date: 12/11/2017 CLINICAL DATA:  Initial evaluation for intermittent left-sided weakness for several weeks. EXAM: CT ANGIOGRAPHY HEAD AND NECK TECHNIQUE: Multidetector CT imaging of the head and neck was performed using the standard protocol during bolus administration of intravenous contrast. Multiplanar CT image reconstructions and  MIPs were obtained to evaluate the vascular anatomy. Carotid stenosis measurements (when applicable) are obtained utilizing NASCET criteria, using the distal internal carotid diameter as the denominator. CONTRAST:  10mL ISOVUE-370 IOPAMIDOL (ISOVUE-370) INJECTION 76% COMPARISON:  Prior CT from 12/10/2017 FINDINGS: CTA NECK FINDINGS Aortic arch: Visualized aortic arch of normal caliber with normal 3 vessel morphology. Moderate atheromatous plaque within the visualize arch and about the origin of the great vessels without hemodynamically significant stenosis. Visualized subclavian arteries widely patent. Right carotid system: Atheromatous irregularity within the right common carotid artery without hemodynamically significant stenosis. No significant atheromatous narrowing about the right carotid bifurcation. Right ICA widely patent distally to the skull base without stenosis, dissection, or occlusion. Left carotid system: Left common carotid artery patent from its origin to the bifurcation without hemodynamically significant stenosis. No significant atheromatous narrowing about the left bifurcation. Left ICA patent from the bifurcation to the skull base without stenosis, dissection, or occlusion. Vertebral arteries: Both of the vertebral arteries arise from the subclavian arteries. Left vertebral artery dominant. Atheromatous stenoses with up to approximate 50% narrowing at the origin of the left vertebral artery. Moderate to severe tandem stenoses noted within the pre foraminal right V1 segment (  series 8, images 259, 245). Vertebral arteries otherwise patent within the neck without high-grade stenosis or occlusion. Skeleton: No acute osseus abnormality. No discrete lytic or blastic osseous lesions. Mild multilevel cervical spondylolysis and facet arthrosis noted within the cervical spine. Other neck: No acute soft tissue abnormality within the neck. No adenopathy. Salivary glands normal. Thyroid appears to be absent.  Upper chest: Visualized upper chest demonstrates no acute finding. Partially visualized lungs are clear. Review of the MIP images confirms the above findings CTA HEAD FINDINGS Anterior circulation: Petrous segments widely patent bilaterally. Mild atheromatous plaque within the cavernous/para clinoid ICAs without hemodynamically significant stenosis. ICA termini widely patent. A1 segments patent bilaterally. Right A1 slightly hypoplastic. Anterior communicating artery patent and normal. Anterior cerebral arteries widely patent to their distal aspects without stenosis. M1 segments mildly irregular but widely patent bilaterally without stenosis. Normal MCA bifurcations. No proximal M2 occlusion distal MCA branches well opacified and symmetric Posterior circulation: Left vertebral artery dominant. There are severe tandem near occlusive stenoses involving the left V4 segment. The hypoplastic right vertebral artery is markedly irregular and attenuated as it courses into the cranial vault, and occludes just beyond the takeoff of the right posterior inferior cerebral artery. Posterior inferior cerebral arteries themselves are patent bilaterally. Basilar artery is diffusely narrowed and somewhat attenuated with multifocal atheromatous irregularity, but is patent to its distal aspect. Superior cerebral arteries patent bilaterally. Right PCA supplied via the basilar as well as a small right posterior communicating artery. Predominant fetal type origin of the left PCA, although a small hypoplastic left P1 segment is visualized. PCAs patent to their distal aspects, although demonstrate distal small vessel atheromatous irregularity. Venous sinuses: Patent. Anatomic variants: Predominant fetal type origin of the left PCA. Delayed phase: No abnormal enhancement. Review of the MIP images confirms the above findings IMPRESSION: 1. Negative CTA for large vessel occlusion. 2. 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 Hypoplastic right V4 segment occludes prior to the vertebrobasilar junction. Basilar artery diffusely narrowed with multifocal atheromatous irregularity. PCAs are well perfused, with predominant fetal type origin of the left PCA, with a fairly robust right posterior communicating artery as well. 3. Additional moderate to advanced stenoses involving the proximal V1 segments as above. 4. Additional mild for age atherosclerotic change about the carotid bifurcations and carotid siphons without hemodynamically significant stenosis. Electronically Signed   By: Jeannine Boga M.D.   On: 12/11/2017 03:18   Ct Head Lindsey Contrast  Result Date: 12/10/2017 CLINICAL DATA:  Dizziness.  Left arm and leg numbness. EXAM: CT HEAD WITHOUT CONTRAST TECHNIQUE: Contiguous axial images were obtained from the base of the skull through the vertex without intravenous contrast. COMPARISON:  11/27/2017 FINDINGS: Brain: There is atrophy and chronic small vessel disease changes. No acute intracranial abnormality. Specifically, no hemorrhage, hydrocephalus, mass lesion, acute infarction, or significant intracranial injury. Vascular: No hyperdense vessel or unexpected calcification. Skull: No acute calvarial abnormality. Sinuses/Orbits: No acute findings Other: None IMPRESSION: No acute intracranial abnormality. Atrophy, chronic microvascular disease. Electronically Signed   By: Rolm Baptise M.D.   On: 12/10/2017 20:42   Ct Angio Neck W Or Lindsey Contrast  Result Date: 12/11/2017 CLINICAL DATA:  Initial evaluation for intermittent left-sided weakness for several weeks. EXAM: CT ANGIOGRAPHY HEAD AND NECK TECHNIQUE: Multidetector CT imaging of the head and neck was performed using the standard protocol during bolus administration of intravenous contrast. Multiplanar CT image reconstructions and MIPs were obtained to evaluate the  vascular anatomy. Carotid stenosis measurements  (when applicable) are obtained utilizing NASCET criteria, using the distal internal carotid diameter as the denominator. CONTRAST:  23mL ISOVUE-370 IOPAMIDOL (ISOVUE-370) INJECTION 76% COMPARISON:  Prior CT from 12/10/2017 FINDINGS: CTA NECK FINDINGS Aortic arch: Visualized aortic arch of normal caliber with normal 3 vessel morphology. Moderate atheromatous plaque within the visualize arch and about the origin of the great vessels without hemodynamically significant stenosis. Visualized subclavian arteries widely patent. Right carotid system: Atheromatous irregularity within the right common carotid artery without hemodynamically significant stenosis. No significant atheromatous narrowing about the right carotid bifurcation. Right ICA widely patent distally to the skull base without stenosis, dissection, or occlusion. Left carotid system: Left common carotid artery patent from its origin to the bifurcation without hemodynamically significant stenosis. No significant atheromatous narrowing about the left bifurcation. Left ICA patent from the bifurcation to the skull base without stenosis, dissection, or occlusion. Vertebral arteries: Both of the vertebral arteries arise from the subclavian arteries. Left vertebral artery dominant. Atheromatous stenoses with up to approximate 50% narrowing at the origin of the left vertebral artery. Moderate to severe tandem stenoses noted within the pre foraminal right V1 segment (series 8, images 259, 245). Vertebral arteries otherwise patent within the neck without high-grade stenosis or occlusion. Skeleton: No acute osseus abnormality. No discrete lytic or blastic osseous lesions. Mild multilevel cervical spondylolysis and facet arthrosis noted within the cervical spine. Other neck: No acute soft tissue abnormality within the neck. No adenopathy. Salivary glands normal. Thyroid appears to be absent. Upper chest: Visualized upper chest demonstrates no acute finding. Partially  visualized lungs are clear. Review of the MIP images confirms the above findings CTA HEAD FINDINGS Anterior circulation: Petrous segments widely patent bilaterally. Mild atheromatous plaque within the cavernous/para clinoid ICAs without hemodynamically significant stenosis. ICA termini widely patent. A1 segments patent bilaterally. Right A1 slightly hypoplastic. Anterior communicating artery patent and normal. Anterior cerebral arteries widely patent to their distal aspects without stenosis. M1 segments mildly irregular but widely patent bilaterally without stenosis. Normal MCA bifurcations. No proximal M2 occlusion distal MCA branches well opacified and symmetric Posterior circulation: Left vertebral artery dominant. There are severe tandem near occlusive stenoses involving the left V4 segment. The hypoplastic right vertebral artery is markedly irregular and attenuated as it courses into the cranial vault, and occludes just beyond the takeoff of the right posterior inferior cerebral artery. Posterior inferior cerebral arteries themselves are patent bilaterally. Basilar artery is diffusely narrowed and somewhat attenuated with multifocal atheromatous irregularity, but is patent to its distal aspect. Superior cerebral arteries patent bilaterally. Right PCA supplied via the basilar as well as a small right posterior communicating artery. Predominant fetal type origin of the left PCA, although a small hypoplastic left P1 segment is visualized. PCAs patent to their distal aspects, although demonstrate distal small vessel atheromatous irregularity. Venous sinuses: Patent. Anatomic variants: Predominant fetal type origin of the left PCA. Delayed phase: No abnormal enhancement. Review of the MIP images confirms the above findings IMPRESSION: 1. Negative CTA for large vessel occlusion. 2. 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 Hypoplastic right  V4 segment occludes prior to the vertebrobasilar junction. Basilar artery diffusely narrowed with multifocal atheromatous irregularity. PCAs are well perfused, with predominant fetal type origin of the left PCA, with a fairly robust right posterior communicating artery as well. 3. Additional moderate to advanced stenoses involving the proximal V1 segments as above. 4. Additional mild for age atherosclerotic change about  the carotid bifurcations and carotid siphons without hemodynamically significant stenosis. Electronically Signed   By: Jeannine Boga M.D.   On: 12/11/2017 03:18   [name] Contrast  Result Date: 12/11/2017 CLINICAL DATA:  84 year old male with waxing and waning left side numbness and weakness. CTA head and neck earlier today positive for severe stenosis, near occlusion of the dominant left vertebral artery. EXAM: MRI HEAD WITHOUT CONTRAST TECHNIQUE: Multiplanar, multiecho pulse sequences of the brain and surrounding structures were obtained without intravenous contrast. COMPARISON:  CTA head and neck 0119 hours today. Brain MRI 11/18/2012. FINDINGS: Brain: Patchy and confluent restricted diffusion in both cerebellar hemispheres, more extensive on the right (series 3, image 6). The PICA territories are primarily affected. There is borderline involvement of the right SCA territory. There is also a punctate focus of restricted diffusion in the right dorsal pons (series 3, image 12). Mild associated T2 and FLAIR hyperintensity in the in the acutely affected areas with no hemorrhage or mass effect. No other No restricted diffusion or evidence of acute infarction. No acute or chronic cerebral blood products identified. Moderate chronic scattered bilateral cerebral white matter T2 and FLAIR hyperintensity has mildly progressed since 2014. No midline shift, mass effect, evidence of mass lesion, ventriculomegaly, extra-axial collection or acute intracranial hemorrhage. Cervicomedullary junction  and pituitary are within normal limits. Vascular: Major intracranial vascular flow voids are stable since 2014. Skull and upper cervical spine: Degenerative ligamentous hypertrophy about the odontoid. Stable visible cervical spine and osseous structures. Sinuses/Orbits: Stable and negative. Other: Mastoids are clear. Visible internal auditory structures appear normal. Scalp and face soft tissues are negative. IMPRESSION: 1. 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 No associated hemorrhage or mass effect. 2. Chronic supratentorial signal changes suggestive of small vessel disease have mildly progressed since 2014. Electronically Signed   By: Genevie Ann M.D.   On: 12/11/2017 13:14   2D Echocardiogram  pending  PHYSICAL EXAM Pleasant elderly Asian male not in distress. . Afebrile. Head is nontraumatic. Neck is supple without bruit.    Cardiac exam no murmur or gallop. Lungs are clear to auscultation. Distal pulses are well felt. Neurological Exam ;  Awake  Alert oriented x 3. Normal speech and language.eye movements full without nystagmus.fundi were not visualized. Vision acuity and fields appear normal. Hearing is normal. Palatal movements are normal. Face symmetric. Tongue midline. Normal strength, tone, reflexes and coordination. Normal sensation. Gait deferred.  ASSESSMENT/PLAN Christian. Jagjit Riner is a 84 y.o. [name] with history of HTN and colon cancer presenting with intermittent L hemiparesis that worsens with dizziness for the past several weeks.  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 Small vessel disease. Atrophy.   CTA head & neck no LVO. 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 There was mild focal   basal hypertrophy of the septum. Systolic function was normal.   The estimated ejection fraction was in the range of 60% to 65%.   Wall motion was normal; there were no regional wall motion    abnormalities.  LDL 49  HgbA1c 6.3  EKGs NSR  SCDs for VTE prophylaxis  aspirin 81 mg daily prior to admission, now on aspirin 325 mg daily. Given mild stroke, will place on aspirin 81 mg and plavix 75 mg daily x 3 weeks, then Plavix alone   Therapy recommendations:  HH PT, RW w/ 5" wheels, no OT or SLP  Disposition:  Return home with family  Hypertension  Stable . Permissive hypertension (OK if < 220/120) but gradually normalize in 5-7 days . 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  Recommend dual antiplatelet therapy for 3 months and aggressive risk factor modification. Recommend diagnostic cerebral catheter angiogram to see if he has a lesion which is amenable to endovascular treatment with angioplasty and stenting..  Long discussion with the patient,, his daughter and Dr. Tyrell Antonio and answered questions.     Greater than 50% time during this 35-minute visit was spent in counseling and coordination of care about his brainstem and cerebral strokes, basilar stenosis and answering questions  Antony Contras, MD Medical Director [facility] Pager: [phone] 12/12/2017 1:46 PM  To contact Stroke Continuity provider, please refer to [email or web address] After hours, contact General Neurology

## 2017-12-12 NOTE — Progress Notes (Signed)
PROGRESS NOTE    [name]  RSW:[id number] DOB: 04-Jan-1929 DOA: 12/10/2017 PCP: No primary care provider on file.    Brief Narrative: [name] is a 84 y.o. male with medical history significant of hypertension, rheumatoid arthritis comes in with 3 weeks of waxing and waning left-sided numbness and weakness that has been occurring on and off.  He reports that he gets very numb the whole entire left side he cannot move it which is been causing frequent falls.  He currently denies any symptoms.  He denies any fevers.  He denies any headaches.  He does not have a history of previous stroke.  Patient take a daily aspirin a day.  Patient is being referred for admission for TIA work-up.   Assessment & Plan:   Principal Problem:   TIA (transient ischemic attack) Active Problems:   Essential hypertension   1-Acute stroke; both cerebral hemispheres -LDL 49, triglyceride 359, EKG; sinus.  HB A1c 6.3.  -CT angio head and neck: Negative CTA for large vessel occlusion. 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 Hypoplastic right V4 segment occludes prior to the vertebrobasilar junction. Basilar artery diffusely narrowed with multifocal atheromatous irregularity. PCAs are well perfused, with predominant fetal type origin of the left PCA, with a fairly robust right posterior communicating artery as well. -MRI; Scattered small acute infarcts in both cerebral hemispheres, and the dorsal right pons. No associated hemorrhage or mass effect. Chronic supratentorial signal changes suggestive of small vessel disease have mildly progressed since 2014. -Discussed with Dr Leonie Man, start aspirin , plavix.  -Permissive HTN.  -start lopid  -ECHO diastolic dysfunction, grade one,   HTN; permissive HTN, hold cozaar, HCTZ.   RA; resume prednisone.   Hypothyroidism; resume synthroid,.      DVT prophylaxis: SCD Code Status: full code.   Family Communication: wife at bedside.  Disposition Plan: follow PT rec   Consultants:   Neurology    Procedures:   ECHO   Antimicrobials: none  Subjective: Patient seen with Dr Leonie Man, patient daughter at bedside.  Patient reported an episode of left side numbness and weakness, lasted few minutes.  He has had similar episodes at home, with fall/  Back to baseline now.   Objective: Vitals:   12/11/17 2000 12/11/17 2320 12/12/17 0846 12/12/17 1233  BP: 132/63 (!) 114/49 139/65 (!) 129/52  Pulse: 70 63 67 70  Resp: 18 18 20 20   Temp: 98.6 F (37 C) 98 F (36.7 C) 97.9 F (36.6 C)   TempSrc: Oral Oral Oral   SpO2: 98% 98% 97% 97%  Weight:        Intake/Output Summary (Last 24 hours) at 12/12/2017 1445 Last data filed at 12/11/2017 2320 Gross per 24 hour  Intake -  Output 1 ml  Net -1 ml   Filed Weights   12/11/17 0533  Weight: 55.5 kg (122 lb 5.7 oz)    Examination:  General exam: NAD  Respiratory system: CTA Cardiovascular system: S 1, S 2 RRR Gastrointestinal system: BS present, soft, nt Central nervous system: alert, non focal.  Extremities: Symmetric power.  Skin: no rash.      Data Reviewed: I have personally reviewed following labs and imaging studies  CBC: Recent Labs  Lab 12/10/17 2012 12/10/17 2024  WBC 7.7  --   NEUTROABS 5.1  --   HGB 13.2 12.9*  HCT 41.4 38.0*  MCV 93.0  --   PLT 215  --  Basic Metabolic Panel: Recent Labs  Lab 12/10/17 2012 12/10/17 2024  NA 138 139  K 3.8 3.6  CL 99 99  CO2 27  --   GLUCOSE 106* 105*  BUN 19 21  CREATININE 1.11 1.10  CALCIUM 9.7  --    GFR: Estimated Creatinine Clearance: 35 mL/min (by C-G formula based on SCr of 1.1 mg/dL). Liver Function Tests: Recent Labs  Lab 12/10/17 2012  AST 26  ALT 18  ALKPHOS 67  BILITOT 0.5  PROT 7.2  ALBUMIN 3.8   No results for input(s): LIPASE, AMYLASE in the last 168 hours. No results for input(s): AMMONIA in the last 168  hours. Coagulation Profile: Recent Labs  Lab 12/10/17 2012  INR 1.01   Cardiac Enzymes: No results for input(s): CKTOTAL, CKMB, CKMBINDEX, TROPONINI in the last 168 hours. BNP (last 3 results) No results for input(s): PROBNP in the last 8760 hours. HbA1C: Recent Labs    12/11/17 0427  HGBA1C 6.3*   CBG: No results for input(s): GLUCAP in the last 168 hours. Lipid Profile: Recent Labs    12/11/17 0427  CHOL 155  HDL 34*  LDLCALC 49  TRIG 359*  CHOLHDL 4.6   Thyroid Function Tests: No results for input(s): TSH, T4TOTAL, FREET4, T3FREE, THYROIDAB in the last 72 hours. Anemia Panel: No results for input(s): VITAMINB12, FOLATE, FERRITIN, TIBC, IRON, RETICCTPCT in the last 72 hours. Sepsis Labs: No results for input(s): PROCALCITON, LATICACIDVEN in the last 168 hours.  No results found for this or any previous visit (from the past 240 hour(s)).       Radiology Studies: Ct Angio Head W Or Wo Contrast  Result Date: 12/11/2017 CLINICAL DATA:  Initial evaluation for intermittent left-sided weakness for several weeks. EXAM: CT ANGIOGRAPHY HEAD AND NECK TECHNIQUE: Multidetector CT imaging of the head and neck was performed using the standard protocol during bolus administration of intravenous contrast. Multiplanar CT image reconstructions and MIPs were obtained to evaluate the vascular anatomy. Carotid stenosis measurements (when applicable) are obtained utilizing NASCET criteria, using the distal internal carotid diameter as the denominator. CONTRAST:  73mL ISOVUE-370 IOPAMIDOL (ISOVUE-370) INJECTION 76% COMPARISON:  Prior CT from 12/10/2017 FINDINGS: CTA NECK FINDINGS Aortic arch: Visualized aortic arch of normal caliber with normal 3 vessel morphology. Moderate atheromatous plaque within the visualize arch and about the origin of the great vessels without hemodynamically significant stenosis. Visualized subclavian arteries widely patent. Right carotid system: Atheromatous  irregularity within the right common carotid artery without hemodynamically significant stenosis. No significant atheromatous narrowing about the right carotid bifurcation. Right ICA widely patent distally to the skull base without stenosis, dissection, or occlusion. Left carotid system: Left common carotid artery patent from its origin to the bifurcation without hemodynamically significant stenosis. No significant atheromatous narrowing about the left bifurcation. Left ICA patent from the bifurcation to the skull base without stenosis, dissection, or occlusion. Vertebral arteries: Both of the vertebral arteries arise from the subclavian arteries. Left vertebral artery dominant. Atheromatous stenoses with up to approximate 50% narrowing at the origin of the left vertebral artery. Moderate to severe tandem stenoses noted within the pre foraminal right V1 segment (series 8, images 259, 245). Vertebral arteries otherwise patent within the neck without high-grade stenosis or occlusion. Skeleton: No acute osseus abnormality. No discrete lytic or blastic osseous lesions. Mild multilevel cervical spondylolysis and facet arthrosis noted within the cervical spine. Other neck: No acute soft tissue abnormality within the neck. No adenopathy. Salivary glands normal. Thyroid appears to be absent.  Upper chest: Visualized upper chest demonstrates no acute finding. Partially visualized lungs are clear. Review of the MIP images confirms the above findings CTA HEAD FINDINGS Anterior circulation: Petrous segments widely patent bilaterally. Mild atheromatous plaque within the cavernous/para clinoid ICAs without hemodynamically significant stenosis. ICA termini widely patent. A1 segments patent bilaterally. Right A1 slightly hypoplastic. Anterior communicating artery patent and normal. Anterior cerebral arteries widely patent to their distal aspects without stenosis. M1 segments mildly irregular but widely patent bilaterally without  stenosis. Normal MCA bifurcations. No proximal M2 occlusion distal MCA branches well opacified and symmetric Posterior circulation: Left vertebral artery dominant. There are severe tandem near occlusive stenoses involving the left V4 segment. The hypoplastic right vertebral artery is markedly irregular and attenuated as it courses into the cranial vault, and occludes just beyond the takeoff of the right posterior inferior cerebral artery. Posterior inferior cerebral arteries themselves are patent bilaterally. Basilar artery is diffusely narrowed and somewhat attenuated with multifocal atheromatous irregularity, but is patent to its distal aspect. Superior cerebral arteries patent bilaterally. Right PCA supplied via the basilar as well as a small right posterior communicating artery. Predominant fetal type origin of the left PCA, although a small hypoplastic left P1 segment is visualized. PCAs patent to their distal aspects, although demonstrate distal small vessel atheromatous irregularity. Venous sinuses: Patent. Anatomic variants: Predominant fetal type origin of the left PCA. Delayed phase: No abnormal enhancement. Review of the MIP images confirms the above findings IMPRESSION: 1. Negative CTA for large vessel occlusion. 2. 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 Hypoplastic right V4 segment occludes prior to the vertebrobasilar junction. Basilar artery diffusely narrowed with multifocal atheromatous irregularity. PCAs are well perfused, with predominant fetal type origin of the left PCA, with a fairly robust right posterior communicating artery as well. 3. Additional moderate to advanced stenoses involving the proximal V1 segments as above. 4. Additional mild for age atherosclerotic change about the carotid bifurcations and carotid siphons without hemodynamically significant stenosis. Electronically Signed   By: Jeannine Boga  M.D.   On: 12/11/2017 03:18   Ct Head Wo Contrast  Result Date: 12/10/2017 CLINICAL DATA:  Dizziness.  Left arm and leg numbness. EXAM: CT HEAD WITHOUT CONTRAST TECHNIQUE: Contiguous axial images were obtained from the base of the skull through the vertex without intravenous contrast. COMPARISON:  11/27/2017 FINDINGS: Brain: There is atrophy and chronic small vessel disease changes. No acute intracranial abnormality. Specifically, no hemorrhage, hydrocephalus, mass lesion, acute infarction, or significant intracranial injury. Vascular: No hyperdense vessel or unexpected calcification. Skull: No acute calvarial abnormality. Sinuses/Orbits: No acute findings Other: None IMPRESSION: No acute intracranial abnormality. Atrophy, chronic microvascular disease. Electronically Signed   By: Rolm Baptise M.D.   On: 12/10/2017 20:42   Ct Angio Neck W Or Wo Contrast  Result Date: 12/11/2017 CLINICAL DATA:  Initial evaluation for intermittent left-sided weakness for several weeks. EXAM: CT ANGIOGRAPHY HEAD AND NECK TECHNIQUE: Multidetector CT imaging of the head and neck was performed using the standard protocol during bolus administration of intravenous contrast. Multiplanar CT image reconstructions and MIPs were obtained to evaluate the vascular anatomy. Carotid stenosis measurements (when applicable) are obtained utilizing NASCET criteria, using the distal internal carotid diameter as the denominator. CONTRAST:  47mL ISOVUE-370 IOPAMIDOL (ISOVUE-370) INJECTION 76% COMPARISON:  Prior CT from 12/10/2017 FINDINGS: CTA NECK FINDINGS Aortic arch: Visualized aortic arch of normal caliber with normal 3 vessel morphology. Moderate atheromatous plaque within the visualize arch and about  the origin of the great vessels without hemodynamically significant stenosis. Visualized subclavian arteries widely patent. Right carotid system: Atheromatous irregularity within the right common carotid artery without hemodynamically  significant stenosis. No significant atheromatous narrowing about the right carotid bifurcation. Right ICA widely patent distally to the skull base without stenosis, dissection, or occlusion. Left carotid system: Left common carotid artery patent from its origin to the bifurcation without hemodynamically significant stenosis. No significant atheromatous narrowing about the left bifurcation. Left ICA patent from the bifurcation to the skull base without stenosis, dissection, or occlusion. Vertebral arteries: Both of the vertebral arteries arise from the subclavian arteries. Left vertebral artery dominant. Atheromatous stenoses with up to approximate 50% narrowing at the origin of the left vertebral artery. Moderate to severe tandem stenoses noted within the pre foraminal right V1 segment (series 8, images 259, 245). Vertebral arteries otherwise patent within the neck without high-grade stenosis or occlusion. Skeleton: No acute osseus abnormality. No discrete lytic or blastic osseous lesions. Mild multilevel cervical spondylolysis and facet arthrosis noted within the cervical spine. Other neck: No acute soft tissue abnormality within the neck. No adenopathy. Salivary glands normal. Thyroid appears to be absent. Upper chest: Visualized upper chest demonstrates no acute finding. Partially visualized lungs are clear. Review of the MIP images confirms the above findings CTA HEAD FINDINGS Anterior circulation: Petrous segments widely patent bilaterally. Mild atheromatous plaque within the cavernous/para clinoid ICAs without hemodynamically significant stenosis. ICA termini widely patent. A1 segments patent bilaterally. Right A1 slightly hypoplastic. Anterior communicating artery patent and normal. Anterior cerebral arteries widely patent to their distal aspects without stenosis. M1 segments mildly irregular but widely patent bilaterally without stenosis. Normal MCA bifurcations. No proximal M2 occlusion distal MCA branches  well opacified and symmetric Posterior circulation: Left vertebral artery dominant. There are severe tandem near occlusive stenoses involving the left V4 segment. The hypoplastic right vertebral artery is markedly irregular and attenuated as it courses into the cranial vault, and occludes just beyond the takeoff of the right posterior inferior cerebral artery. Posterior inferior cerebral arteries themselves are patent bilaterally. Basilar artery is diffusely narrowed and somewhat attenuated with multifocal atheromatous irregularity, but is patent to its distal aspect. Superior cerebral arteries patent bilaterally. Right PCA supplied via the basilar as well as a small right posterior communicating artery. Predominant fetal type origin of the left PCA, although a small hypoplastic left P1 segment is visualized. PCAs patent to their distal aspects, although demonstrate distal small vessel atheromatous irregularity. Venous sinuses: Patent. Anatomic variants: Predominant fetal type origin of the left PCA. Delayed phase: No abnormal enhancement. Review of the MIP images confirms the above findings IMPRESSION: 1. Negative CTA for large vessel occlusion. 2. 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 Hypoplastic right V4 segment occludes prior to the vertebrobasilar junction. Basilar artery diffusely narrowed with multifocal atheromatous irregularity. PCAs are well perfused, with predominant fetal type origin of the left PCA, with a fairly robust right posterior communicating artery as well. 3. Additional moderate to advanced stenoses involving the proximal V1 segments as above. 4. Additional mild for age atherosclerotic change about the carotid bifurcations and carotid siphons without hemodynamically significant stenosis. Electronically Signed   By: Jeannine Boga M.D.   On: 12/11/2017 03:18   Mr Brain Wo Contrast  Result Date:  12/11/2017 CLINICAL DATA:  84 year old male with waxing and waning left side numbness and weakness. CTA head and neck earlier today positive for severe stenosis, near occlusion of  the dominant left vertebral artery. EXAM: MRI HEAD WITHOUT CONTRAST TECHNIQUE: Multiplanar, multiecho pulse sequences of the brain and surrounding structures were obtained without intravenous contrast. COMPARISON:  CTA head and neck 0119 hours today. Brain MRI 11/18/2012. FINDINGS: Brain: Patchy and confluent restricted diffusion in both cerebellar hemispheres, more extensive on the right (series 3, image 6). The PICA territories are primarily affected. There is borderline involvement of the right SCA territory. There is also a punctate focus of restricted diffusion in the right dorsal pons (series 3, image 12). Mild associated T2 and FLAIR hyperintensity in the in the acutely affected areas with no hemorrhage or mass effect. No other No restricted diffusion or evidence of acute infarction. No acute or chronic cerebral blood products identified. Moderate chronic scattered bilateral cerebral white matter T2 and FLAIR hyperintensity has mildly progressed since 2014. No midline shift, mass effect, evidence of mass lesion, ventriculomegaly, extra-axial collection or acute intracranial hemorrhage. Cervicomedullary junction and pituitary are within normal limits. Vascular: Major intracranial vascular flow voids are stable since 2014. Skull and upper cervical spine: Degenerative ligamentous hypertrophy about the odontoid. Stable visible cervical spine and osseous structures. Sinuses/Orbits: Stable and negative. Other: Mastoids are clear. Visible internal auditory structures appear normal. Scalp and face soft tissues are negative. IMPRESSION: 1. 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 No associated hemorrhage or mass effect. 2. Chronic supratentorial signal changes suggestive of small vessel disease  have mildly progressed since 2014. Electronically Signed   By: Genevie Ann M.D.   On: 12/11/2017 13:14        Scheduled Meds: .  stroke: mapping our early stages of recovery book   Does not apply Once  . aspirin EC  81 mg Oral Daily  . clopidogrel  75 mg Oral Daily  . gemfibrozil  300 mg Oral BID AC  . levothyroxine  88 mcg Oral QAC breakfast  . multivitamins with iron  1 tablet Oral Daily  . predniSONE  5 mg Oral Q breakfast   Continuous Infusions:   LOS: 1 day    Time spent: 35 minutes.     Elmarie Shiley, MD [facility] Pager [phone]  If 7PM-7AM, please contact night-coverage [email or web address] Password [facility] 12/12/2017, 2:45 PM

## 2017-12-12 NOTE — Consult Note (Signed)
Chief Complaint: Patient was seen in consultation today for cerebral arteriogram Chief Complaint  Patient presents with  . Dizziness   at the request of Dr Royal Hawthorn   Supervising Physician: Luanne Bras  Patient Status: [facility] - In-pt  History of Present Illness: [name] is a 84 y.o. male   HTN; Rh arthritis TIAs Left sided numbness off and on for weeks Can last a few minutes to 30 minutes Dizziness noted Denies headache Denies N/V  MR:  IMPRESSION: 1. 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 No associated hemorrhage or mass effect. 2. Chronic supratentorial signal changes suggestive of small vessel disease have mildly progressed since 2014.  CTA:  IMPRESSION: 1. Negative CTA for large vessel occlusion. 2. 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 Hypoplastic right V4 segment occludes prior to the vertebrobasilar junction. Basilar artery diffusely narrowed with multifocal atheromatous irregularity. PCAs are well perfused, with predominant fetal type origin of the left PCA, with a fairly robust right posterior communicating artery as well. 3. Additional moderate to advanced stenoses involving the proximal V1 segments as above.  Request for cerebral arteriogram per Dr Leonie Man Dr Estanislado Pandy aware and agreeable   Past Medical History:  Diagnosis Date  . 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 F/U with cancer center and Dr. Lucia Gaskins rotinely  . Eczema   . Epistaxis    f/u per ENT  . Erectile dysfunction   . Gastric ulcer 12/11   H-Pylori Tx, EGD 3-12: gastritis  . Hay fever   . Hypertension   . Hypothyroidism    Hypothyroid  . Insomnia   . Osteopenia     per DEXA 07-2008 (Rx fosamax)  . Rheumatoid arthritis [facility])     Past Surgical History:  Procedure Laterality Date  . COLON SURGERY     Right /  Adenocarcinoma / Chemo  . COLONOSCOPY  2005, 2008   Dr. Lucia Gaskins  . INGUINAL HERNIA REPAIR     Right    Allergies: Asa [aspirin] and Penicillin g  Medications: Prior to Admission medications   Medication Sig Start Date End Date Taking? Authorizing Provider  aspirin 81 MG tablet Take 81 mg by mouth daily.     Yes [provider]  fluticasone (FLONASE) 50 MCG/ACT nasal spray Place 2 sprays into the nose daily as needed for allergies.    Yes [provider]  levothyroxine (SYNTHROID, LEVOTHROID) 88 MCG tablet Take 88 mcg by mouth daily. 10/20/16 12/10/17 Yes [provider]  losartan (COZAAR) 25 MG tablet Take 25 mg by mouth daily. 12/05/17  Yes [provider]  losartan-hydrochlorothiazide (HYZAAR) 50-12.5 MG per tablet TAKE ONE TABLET BY MOUTH ONE TIME DAILY 02/23/12  Yes Colon Branch, MD  methotrexate 2.5 MG tablet Take 15 mg by mouth once a week.   Yes [provider]  multivitamin [facility]) per tablet Take 1 tablet by mouth daily.     Yes [provider]  predniSONE (DELTASONE) 5 MG tablet Take 5 mg by mouth daily with breakfast.   Yes [provider]  sildenafil (VIAGRA) 100 MG tablet Take 1 tablet (100 mg total) by mouth daily as needed. Patient taking differently: Take 100 mg by mouth daily as needed for erectile dysfunction.  11/30/10  Yes Paz, Alda Berthold, MD  Vitamin D, Ergocalciferol, (DRISDOL) 50000 units CAPS capsule Take 50,000 Units by mouth once a week. 08/25/16  Yes [provider]  clopidogrel (PLAVIX) 75 MG tablet Take 1 tablet (75 mg total) by mouth daily. 12/13/17   Regalado, Belkys A, MD  gemfibrozil (LOPID) 600 MG tablet Take 0.5 tablets (300 mg total) by mouth 2 (two) times daily before a meal. 12/12/17   Regalado, Cassie Freer, MD     Family History  Problem Relation Age of Onset  . Diabetes Neg Hx   . Heart attack Neg Hx   . Colon cancer Neg Hx   . Prostate cancer Neg Hx     Social History    Socioeconomic History  . Marital status: Married    Spouse name: Not on file  . Number of children: 2  . Years of education: Not on file  . Highest education level: Not on file  Occupational History    Employer: RETIRED  Social Needs  . Financial resource strain: Not on file  . Food insecurity:    Worry: Not on file    Inability: Not on file  . Transportation needs:    Medical: Not on file    Non-medical: Not on file  Tobacco Use  . Smoking status: Never Smoker  . Smokeless tobacco: Never Used  Substance and Sexual Activity  . Alcohol use: No    Alcohol/week: 0.0 oz  . Drug use: No  . Sexual activity: Not on file  Lifestyle  . Physical activity:    Days per week: Not on file    Minutes per session: Not on file  . Stress: Not on file  Relationships  . Social connections:    Talks on phone: Not on file    Gets together: Not on file    Attends religious service: Not on file    Active member of club or organization: Not on file    Attends meetings of clubs or organizations: Not on file    Relationship status: Not on file  Other Topics Concern  . Not on file  Social History Narrative   Original from [place]   Vegetarian   Daily caffeine use one per day    Review of Systems: A 12 point ROS discussed and pertinent positives are indicated in the HPI above.  All other systems are negative.  Review of Systems  Constitutional: Positive for activity change. Negative for fatigue and fever.  HENT: Negative for hearing loss, tinnitus and trouble swallowing.   Eyes: Negative for visual disturbance.  Respiratory: Negative for cough and shortness of breath.   Cardiovascular: Negative for chest pain.  Gastrointestinal: Negative for abdominal pain, nausea and vomiting.  Musculoskeletal: Negative for gait problem, neck pain and neck stiffness.  Neurological: Positive for dizziness, weakness and numbness. Negative for tremors, seizures, syncope, facial asymmetry, speech  difficulty, light-headedness and headaches.  Psychiatric/Behavioral: Negative for behavioral problems and confusion.    Vital Signs: BP (!) 129/52 (BP Location: Left Arm)   Pulse 70   Temp 97.9 F (36.6 C) (Oral)   Resp 20   Wt 122 lb 5.7 oz (55.5 kg)   SpO2 97%   BMI 23.12 kg/m   Physical Exam  Constitutional: He is oriented to person, place, and time. He appears well-nourished.  HENT:  Head: Atraumatic.  Eyes: EOM are normal.  Neck: Neck supple.  Cardiovascular: Normal rate, regular rhythm and normal heart sounds.  Pulmonary/Chest: Effort normal and breath sounds normal.  Abdominal: Soft. Bowel sounds are normal.  Musculoskeletal: Normal range of motion. He exhibits no edema or deformity.  Neurological: He is  alert and oriented to person, place, and time.  Skin: Skin is warm and dry.  Psychiatric: He has a normal mood and affect. His behavior is normal. Judgment and thought content normal.  Nursing note and vitals reviewed.   Imaging: Ct Angio Head W Or Wo Contrast  Result Date: 12/11/2017 CLINICAL DATA:  Initial evaluation for intermittent left-sided weakness for several weeks. EXAM: CT ANGIOGRAPHY HEAD AND NECK TECHNIQUE: Multidetector CT imaging of the head and neck was performed using the standard protocol during bolus administration of intravenous contrast. Multiplanar CT image reconstructions and MIPs were obtained to evaluate the vascular anatomy. Carotid stenosis measurements (when applicable) are obtained utilizing NASCET criteria, using the distal internal carotid diameter as the denominator. CONTRAST:  66mL ISOVUE-370 IOPAMIDOL (ISOVUE-370) INJECTION 76% COMPARISON:  Prior CT from 12/10/2017 FINDINGS: CTA NECK FINDINGS Aortic arch: Visualized aortic arch of normal caliber with normal 3 vessel morphology. Moderate atheromatous plaque within the visualize arch and about the origin of the great vessels without hemodynamically significant stenosis. Visualized subclavian  arteries widely patent. Right carotid system: Atheromatous irregularity within the right common carotid artery without hemodynamically significant stenosis. No significant atheromatous narrowing about the right carotid bifurcation. Right ICA widely patent distally to the skull base without stenosis, dissection, or occlusion. Left carotid system: Left common carotid artery patent from its origin to the bifurcation without hemodynamically significant stenosis. No significant atheromatous narrowing about the left bifurcation. Left ICA patent from the bifurcation to the skull base without stenosis, dissection, or occlusion. Vertebral arteries: Both of the vertebral arteries arise from the subclavian arteries. Left vertebral artery dominant. Atheromatous stenoses with up to approximate 50% narrowing at the origin of the left vertebral artery. Moderate to severe tandem stenoses noted within the pre foraminal right V1 segment (series 8, images 259, 245). Vertebral arteries otherwise patent within the neck without high-grade stenosis or occlusion. Skeleton: No acute osseus abnormality. No discrete lytic or blastic osseous lesions. Mild multilevel cervical spondylolysis and facet arthrosis noted within the cervical spine. Other neck: No acute soft tissue abnormality within the neck. No adenopathy. Salivary glands normal. Thyroid appears to be absent. Upper chest: Visualized upper chest demonstrates no acute finding. Partially visualized lungs are clear. Review of the MIP images confirms the above findings CTA HEAD FINDINGS Anterior circulation: Petrous segments widely patent bilaterally. Mild atheromatous plaque within the cavernous/para clinoid ICAs without hemodynamically significant stenosis. ICA termini widely patent. A1 segments patent bilaterally. Right A1 slightly hypoplastic. Anterior communicating artery patent and normal. Anterior cerebral arteries widely patent to their distal aspects without stenosis. M1 segments  mildly irregular but widely patent bilaterally without stenosis. Normal MCA bifurcations. No proximal M2 occlusion distal MCA branches well opacified and symmetric Posterior circulation: Left vertebral artery dominant. There are severe tandem near occlusive stenoses involving the left V4 segment. The hypoplastic right vertebral artery is markedly irregular and attenuated as it courses into the cranial vault, and occludes just beyond the takeoff of the right posterior inferior cerebral artery. Posterior inferior cerebral arteries themselves are patent bilaterally. Basilar artery is diffusely narrowed and somewhat attenuated with multifocal atheromatous irregularity, but is patent to its distal aspect. Superior cerebral arteries patent bilaterally. Right PCA supplied via the basilar as well as a small right posterior communicating artery. Predominant fetal type origin of the left PCA, although a small hypoplastic left P1 segment is visualized. PCAs patent to their distal aspects, although demonstrate distal small vessel atheromatous irregularity. Venous sinuses: Patent. Anatomic variants: Predominant fetal type origin of the  left PCA. Delayed phase: No abnormal enhancement. Review of the MIP images confirms the above findings IMPRESSION: 1. Negative CTA for large vessel occlusion. 2. 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 Hypoplastic right V4 segment occludes prior to the vertebrobasilar junction. Basilar artery diffusely narrowed with multifocal atheromatous irregularity. PCAs are well perfused, with predominant fetal type origin of the left PCA, with a fairly robust right posterior communicating artery as well. 3. Additional moderate to advanced stenoses involving the proximal V1 segments as above. 4. Additional mild for age atherosclerotic change about the carotid bifurcations and carotid siphons without hemodynamically significant  stenosis. Electronically Signed   By: Jeannine Boga M.D.   On: 12/11/2017 03:18   Ct Head Wo Contrast  Result Date: 12/10/2017 CLINICAL DATA:  Dizziness.  Left arm and leg numbness. EXAM: CT HEAD WITHOUT CONTRAST TECHNIQUE: Contiguous axial images were obtained from the base of the skull through the vertex without intravenous contrast. COMPARISON:  11/27/2017 FINDINGS: Brain: There is atrophy and chronic small vessel disease changes. No acute intracranial abnormality. Specifically, no hemorrhage, hydrocephalus, mass lesion, acute infarction, or significant intracranial injury. Vascular: No hyperdense vessel or unexpected calcification. Skull: No acute calvarial abnormality. Sinuses/Orbits: No acute findings Other: None IMPRESSION: No acute intracranial abnormality. Atrophy, chronic microvascular disease. Electronically Signed   By: Rolm Baptise M.D.   On: 12/10/2017 20:42   Ct Angio Neck W Or Wo Contrast  Result Date: 12/11/2017 CLINICAL DATA:  Initial evaluation for intermittent left-sided weakness for several weeks. EXAM: CT ANGIOGRAPHY HEAD AND NECK TECHNIQUE: Multidetector CT imaging of the head and neck was performed using the standard protocol during bolus administration of intravenous contrast. Multiplanar CT image reconstructions and MIPs were obtained to evaluate the vascular anatomy. Carotid stenosis measurements (when applicable) are obtained utilizing NASCET criteria, using the distal internal carotid diameter as the denominator. CONTRAST:  71mL ISOVUE-370 IOPAMIDOL (ISOVUE-370) INJECTION 76% COMPARISON:  Prior CT from 12/10/2017 FINDINGS: CTA NECK FINDINGS Aortic arch: Visualized aortic arch of normal caliber with normal 3 vessel morphology. Moderate atheromatous plaque within the visualize arch and about the origin of the great vessels without hemodynamically significant stenosis. Visualized subclavian arteries widely patent. Right carotid system: Atheromatous irregularity within the  right common carotid artery without hemodynamically significant stenosis. No significant atheromatous narrowing about the right carotid bifurcation. Right ICA widely patent distally to the skull base without stenosis, dissection, or occlusion. Left carotid system: Left common carotid artery patent from its origin to the bifurcation without hemodynamically significant stenosis. No significant atheromatous narrowing about the left bifurcation. Left ICA patent from the bifurcation to the skull base without stenosis, dissection, or occlusion. Vertebral arteries: Both of the vertebral arteries arise from the subclavian arteries. Left vertebral artery dominant. Atheromatous stenoses with up to approximate 50% narrowing at the origin of the left vertebral artery. Moderate to severe tandem stenoses noted within the pre foraminal right V1 segment (series 8, images 259, 245). Vertebral arteries otherwise patent within the neck without high-grade stenosis or occlusion. Skeleton: No acute osseus abnormality. No discrete lytic or blastic osseous lesions. Mild multilevel cervical spondylolysis and facet arthrosis noted within the cervical spine. Other neck: No acute soft tissue abnormality within the neck. No adenopathy. Salivary glands normal. Thyroid appears to be absent. Upper chest: Visualized upper chest demonstrates no acute finding. Partially visualized lungs are clear. Review of the MIP images confirms the above findings CTA HEAD FINDINGS Anterior circulation: Petrous segments widely patent bilaterally. Mild  atheromatous plaque within the cavernous/para clinoid ICAs without hemodynamically significant stenosis. ICA termini widely patent. A1 segments patent bilaterally. Right A1 slightly hypoplastic. Anterior communicating artery patent and normal. Anterior cerebral arteries widely patent to their distal aspects without stenosis. M1 segments mildly irregular but widely patent bilaterally without stenosis. Normal MCA  bifurcations. No proximal M2 occlusion distal MCA branches well opacified and symmetric Posterior circulation: Left vertebral artery dominant. There are severe tandem near occlusive stenoses involving the left V4 segment. The hypoplastic right vertebral artery is markedly irregular and attenuated as it courses into the cranial vault, and occludes just beyond the takeoff of the right posterior inferior cerebral artery. Posterior inferior cerebral arteries themselves are patent bilaterally. Basilar artery is diffusely narrowed and somewhat attenuated with multifocal atheromatous irregularity, but is patent to its distal aspect. Superior cerebral arteries patent bilaterally. Right PCA supplied via the basilar as well as a small right posterior communicating artery. Predominant fetal type origin of the left PCA, although a small hypoplastic left P1 segment is visualized. PCAs patent to their distal aspects, although demonstrate distal small vessel atheromatous irregularity. Venous sinuses: Patent. Anatomic variants: Predominant fetal type origin of the left PCA. Delayed phase: No abnormal enhancement. Review of the MIP images confirms the above findings IMPRESSION: 1. Negative CTA for large vessel occlusion. 2. 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 Hypoplastic right V4 segment occludes prior to the vertebrobasilar junction. Basilar artery diffusely narrowed with multifocal atheromatous irregularity. PCAs are well perfused, with predominant fetal type origin of the left PCA, with a fairly robust right posterior communicating artery as well. 3. Additional moderate to advanced stenoses involving the proximal V1 segments as above. 4. Additional mild for age atherosclerotic change about the carotid bifurcations and carotid siphons without hemodynamically significant stenosis. Electronically Signed   By: Jeannine Boga M.D.   On:  12/11/2017 03:18   Mr Brain Wo Contrast  Result Date: 12/11/2017 CLINICAL DATA:  84 year old male with waxing and waning left side numbness and weakness. CTA head and neck earlier today positive for severe stenosis, near occlusion of the dominant left vertebral artery. EXAM: MRI HEAD WITHOUT CONTRAST TECHNIQUE: Multiplanar, multiecho pulse sequences of the brain and surrounding structures were obtained without intravenous contrast. COMPARISON:  CTA head and neck 0119 hours today. Brain MRI 11/18/2012. FINDINGS: Brain: Patchy and confluent restricted diffusion in both cerebellar hemispheres, more extensive on the right (series 3, image 6). The PICA territories are primarily affected. There is borderline involvement of the right SCA territory. There is also a punctate focus of restricted diffusion in the right dorsal pons (series 3, image 12). Mild associated T2 and FLAIR hyperintensity in the in the acutely affected areas with no hemorrhage or mass effect. No other No restricted diffusion or evidence of acute infarction. No acute or chronic cerebral blood products identified. Moderate chronic scattered bilateral cerebral white matter T2 and FLAIR hyperintensity has mildly progressed since 2014. No midline shift, mass effect, evidence of mass lesion, ventriculomegaly, extra-axial collection or acute intracranial hemorrhage. Cervicomedullary junction and pituitary are within normal limits. Vascular: Major intracranial vascular flow voids are stable since 2014. Skull and upper cervical spine: Degenerative ligamentous hypertrophy about the odontoid. Stable visible cervical spine and osseous structures. Sinuses/Orbits: Stable and negative. Other: Mastoids are clear. Visible internal auditory structures appear normal. Scalp and face soft tissues are negative. IMPRESSION: 1. 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 No associated hemorrhage  or mass effect. 2. Chronic  supratentorial signal changes suggestive of small vessel disease have mildly progressed since 2014. Electronically Signed   By: Genevie Ann M.D.   On: 12/11/2017 13:14    Labs:  CBC: Recent Labs    12/10/17 2012 12/10/17 2024  WBC 7.7  --   HGB 13.2 12.9*  HCT 41.4 38.0*  PLT 215  --     COAGS: Recent Labs    12/10/17 2012  INR 1.01  APTT 32    BMP: Recent Labs    12/10/17 2012 12/10/17 2024  NA 138 139  K 3.8 3.6  CL 99 99  CO2 27  --   GLUCOSE 106* 105*  BUN 19 21  CALCIUM 9.7  --   CREATININE 1.11 1.10  GFRNONAA 58*  --   GFRAA >60  --     LIVER FUNCTION TESTS: Recent Labs    12/10/17 2012  BILITOT 0.5  AST 26  ALT 18  ALKPHOS 67  PROT 7.2  ALBUMIN 3.8    TUMOR MARKERS: No results for input(s): AFPTM, CEA, CA199, CHROMGRNA in the last 8760 hours.  Assessment and Plan:  TIAs Left numbness; dizziness Abnormal CTA Now scheduled for cerebral arteriogram in IR Risks and benefits of cerebral angiogram with intervention were discussed with the patient including, but not limited to bleeding, infection, vascular injury, contrast induced renal failure, stroke or even death.  This interventional procedure involves the use of X-rays and because of the nature of the planned procedure, it is possible that we will have prolonged use of X-ray fluoroscopy.  Potential radiation risks to you include (but are not limited to) the following: - A slightly elevated risk for cancer  several years later in life. This risk is typically less than 0.5% percent. This risk is low in comparison to the normal incidence of human cancer, which is 33% for women and 50% for men according to the [place]. - Radiation induced injury can include skin redness, resembling a rash, tissue breakdown / ulcers and hair loss (which can be temporary or permanent).   The likelihood of either of these occurring depends on the difficulty of the procedure and whether you are  sensitive to radiation due to previous procedures, disease, or genetic conditions.   IF your procedure requires a prolonged use of radiation, you will be notified and given written instructions for further action.  It is your responsibility to monitor the irradiated area for the 2 weeks following the procedure and to notify your physician if you are concerned that you have suffered a radiation induced injury.    All of the patient's questions were answered, patient is agreeable to proceed.  Consent signed and in chart.   Thank you for this interesting consult.  I greatly enjoyed meeting Bradly Sangiovanni and look forward to participating in their care.  A copy of this report was sent to the requesting provider on this date.  Electronically Signed: Lavonia Drafts, PA-C 12/12/2017, 1:56 PM   I spent a total of 20 Minutes    in face to face in clinical consultation, greater than 50% of which was counseling/coordinating care for cerebral arteriogram

## 2017-12-13 ENCOUNTER — Inpatient Hospital Stay (HOSPITAL_COMMUNITY): Payer: Medicare Other

## 2017-12-13 HISTORY — PX: IR ANGIO INTRA EXTRACRAN SEL COM CAROTID INNOMINATE BILAT MOD SED: IMG5360

## 2017-12-13 HISTORY — PX: IR ANGIO VERTEBRAL SEL VERTEBRAL BILAT MOD SED: IMG5369

## 2017-12-13 LAB — CBC
HCT: 39.8 % (ref 39.0–52.0)
Hemoglobin: 13 g/dL (ref 13.0–17.0)
MCH: 30.2 pg (ref 26.0–34.0)
MCHC: 32.7 g/dL (ref 30.0–36.0)
MCV: 92.3 fL (ref 78.0–100.0)
Platelets: 208 10*3/uL (ref 150–400)
RBC: 4.31 MIL/uL (ref 4.22–5.81)
RDW: 14.4 % (ref 11.5–15.5)
WBC: 9.7 10*3/uL (ref 4.0–10.5)

## 2017-12-13 LAB — BASIC METABOLIC PANEL
Anion gap: 9 (ref 5–15)
BUN: 17 mg/dL (ref 8–23)
CO2: 26 mmol/L (ref 22–32)
Calcium: 9.4 mg/dL (ref 8.9–10.3)
Chloride: 106 mmol/L (ref 98–111)
Creatinine, Ser: 1.05 mg/dL (ref 0.61–1.24)
GFR calc Af Amer: >60 mL/min (ref >60)
GFR calc non Af Amer: >60 mL/min (ref >60)
Glucose, Bld: 109 mg/dL — ABNORMAL HIGH (ref 70–99)
Potassium: 4 mmol/L (ref 3.5–5.1)
Sodium: 141 mmol/L (ref 135–145)

## 2017-12-13 LAB — PROTIME-INR
INR: 1.02
Prothrombin Time: 13.3 seconds (ref 11.4–15.2)

## 2017-12-13 MED ORDER — LIDOCAINE HCL (PF) 1 % IJ SOLN
INTRAMUSCULAR | Status: AC | PRN
  Administered 2017-12-13: 5 mL

## 2017-12-13 MED ORDER — FENTANYL CITRATE (PF) 100 MCG/2ML IJ SOLN
INTRAMUSCULAR | Status: AC
  Filled 2017-12-13: qty 2

## 2017-12-13 MED ORDER — POLYVINYL ALCOHOL 1.4 % OP SOLN
1.0000 [drp] | Freq: Four times a day (QID) | OPHTHALMIC | Status: DC | PRN
  Administered 2017-12-13: 1 [drp] via OPHTHALMIC
  Filled 2017-12-13 (×2): qty 15

## 2017-12-13 MED ORDER — IOHEXOL 300 MG/ML  SOLN: 90.0000 mL | Freq: Once | INTRAMUSCULAR | Status: DC | PRN

## 2017-12-13 MED ORDER — HEPARIN SODIUM (PORCINE) 1000 UNIT/ML IJ SOLN
INTRAMUSCULAR | Status: AC
  Filled 2017-12-13: qty 1

## 2017-12-13 MED ORDER — IOPAMIDOL (ISOVUE-300) INJECTION 61%
INTRAVENOUS | Status: AC
  Filled 2017-12-13: qty 50

## 2017-12-13 MED ORDER — MIDAZOLAM HCL 2 MG/2ML IJ SOLN
INTRAMUSCULAR | Status: AC | PRN
  Administered 2017-12-13: 1 mg via INTRAVENOUS

## 2017-12-13 MED ORDER — CLOPIDOGREL BISULFATE 75 MG PO TABS
150.0000 mg | ORAL_TABLET | Freq: Once | ORAL | Status: AC
  Administered 2017-12-13: 150 mg via ORAL
  Filled 2017-12-13: qty 2

## 2017-12-13 MED ORDER — FENTANYL CITRATE (PF) 100 MCG/2ML IJ SOLN
INTRAMUSCULAR | Status: AC | PRN
  Administered 2017-12-13: 25 ug via INTRAVENOUS

## 2017-12-13 MED ORDER — SODIUM CHLORIDE 0.9 % IV SOLN
INTRAVENOUS | Status: AC
  Administered 2017-12-13: 14:00:00 via INTRAVENOUS

## 2017-12-13 MED ORDER — HEPARIN SODIUM (PORCINE) 1000 UNIT/ML IJ SOLN
INTRAMUSCULAR | Status: AC | PRN
  Administered 2017-12-13: 1000 [IU] via INTRAVENOUS

## 2017-12-13 MED ORDER — MIDAZOLAM HCL 2 MG/2ML IJ SOLN
INTRAMUSCULAR | Status: AC
  Filled 2017-12-13: qty 2

## 2017-12-13 MED ORDER — LIDOCAINE HCL 1 % IJ SOLN
INTRAMUSCULAR | Status: AC
  Filled 2017-12-13: qty 20

## 2017-12-13 NOTE — Progress Notes (Signed)
CM met with the patient and his daughter yesterday about d/c planning. She has many questions about whats available for her father and mother. She was interested in caregivers in the home, ALF, SNF and other community based programs along with transportation. CM provided her an ALF list, SNF list, PACE brochure, instructed her on where to find private duty aides, transportation information for seniors and information on alert systems for the home.  Currently daughter seems to be leaning toward home with [facility] services. CM also provided her this list. She chose [place].  Daughter inquired about SNF rehab. CM did inform her that with her father walking over 200 feet with min assist that he most likely wont qualify for SNF rehab. She voiced understanding. CM following for d/c disposition, needs, physician orders.

## 2017-12-13 NOTE — Progress Notes (Signed)
PT Cancellation Note  Patient Details Name: [name] MRN: [id number] DOB: 09/01/1928   Cancelled Treatment:    Reason Eval/Treat Not Completed: Active bedrest order Pt post sheath removal with bed rest order ending 5:46 PM. PT will continue to follow acutely.    Salina April, PTA Pager: [phone]   12/13/2017, 2:35 PM

## 2017-12-13 NOTE — Sedation Documentation (Signed)
Wound and pulses checked with patients primary nurse on 3W.

## 2017-12-13 NOTE — Procedures (Signed)
S/P 4 vessel cerebral arteriogram RT CFA approach. Findings. 1. Preocclusive severe stenosis 95 % plus of  dominant Lt VBJ, ,and  70 % of the prox basilar artery. 2.Occluded non dominant RT VBJ at the RT PICA 3.Approx  30 %  Stenosis of dominant  LT VA at origin.

## 2017-12-13 NOTE — Progress Notes (Addendum)
PROGRESS NOTE    [name]  TDD:[id number] DOB: [birth year] DOA: 12/10/2017 PCP: No primary care provider on file.    Brief Narrative: [name] is a 84 y.o. male with medical history significant of hypertension, rheumatoid arthritis comes in with 3 weeks of waxing and waning left-sided numbness and weakness that has been occurring on and off.  He reports that he gets very numb the whole entire left side he cannot move it which is been causing frequent falls.  He currently denies any symptoms.  He denies any fevers.  He denies any headaches.  He does not have a history of previous stroke.  Patient take a daily aspirin a day.  Patient is being referred for admission for TIA work-up.  Patient admitted with acute stroke, he presents with numbness left side, weakness and dizziness. He has had multiples episodes at home. He had MRI , positive fro cerebral hemisphere stroke, and CTA showed basilar artery stenosis. He develop a recurrent episode while in the hospital on 7-24. symptoms resolved. He was refer for cerebral angiogram.   angiogram showed pre-occlusive severe stenosis 95 % plus of dominant VBJ and 70 % of the proximal basilar artery. Occluded non dominant RT VBJ at the RT PICA. approx 30 % stenosis of dominant LT VA origin.  Awaiting neurology and Dr Estanislado Pandy recommendation.   Assessment & Plan:   Principal Problem:   TIA (transient ischemic attack) Active Problems:   Essential hypertension   1-Acute stroke; both cerebral hemispheres -LDL 49, triglyceride 359, EKG; sinus.  HB A1c 6.3.  -CT angio head and neck: Negative CTA for large vessel occlusion. 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 Hypoplastic right V4 segment occludes prior to the vertebrobasilar junction. Basilar artery diffusely narrowed with multifocal atheromatous irregularity. PCAs are well perfused, with predominant fetal type origin of the  left PCA, with a fairly robust right posterior communicating artery as well. -MRI; Scattered small acute infarcts in both cerebral hemispheres, and the dorsal right pons. No associated hemorrhage or mass effect. Chronic supratentorial signal changes suggestive of small vessel disease have mildly progressed since 2014. -Discussed with Dr Leonie Man, started  aspirin , plavix.  -Permissive HTN.  -started  lopid  -ECHO diastolic dysfunction, grade one,  -Episode of left side numbness and weakness, transiently. Refer for angiogram. See result angiogram. Awaiting neurology rec.  -Discussed with Dr Leonie Man, if patient develops acute neuro changes overnight, will need to contact Dr Estanislado Pandy for emergent stent placement. If patient is stable by 7-26 patient might be able to be discharge and he will need to follow up with Dr Bronson Curb for stent out patient.    HTN; permissive HTN, hold cozaar, HCTZ.   RA; resume prednisone.   Hypothyroidism; resume synthroid,.      DVT prophylaxis: SCD Code Status: full code.  Family Communication: wife at bedside.  Disposition Plan: follow PT rec. Family considering  SNF    Consultants:   Neurology    Procedures:   ECHO   Antimicrobials: none  Subjective: He is feeling well, he is resting in bed. Needs to be on bed rest for 6 hours.  He denies any recurrent episode of numbness, weakness since yesterday    Objective: Vitals:   12/13/17 1245 12/13/17 1300 12/13/17 1315 12/13/17 1345  BP: 134/60 134/70 140/71 (!) 143/65  Pulse: (!) 59 67 61   Resp: 16 16 17 12   Temp:    (!) 97.5 F (36.4 C)  TempSrc:    Oral  SpO2: 97% 97% 100% 96%  Weight:      Height:       No intake or output data in the 24 hours ending 12/13/17 1441 Filed Weights   12/11/17 0533 12/13/17 0224  Weight: 55.5 kg (122 lb 5.7 oz) 55.5 kg (122 lb 5.7 oz)    Examination:  General exam: NAD Respiratory system: CTA Cardiovascular system: S 1, S 2 RRR Gastrointestinal  system: BS present, soft, bnt Central nervous system: Alert, resting in bed, bedrest  Extremities: Symmetric power.  Skin: No rash.     Data Reviewed: I have personally reviewed following labs and imaging studies  CBC: Recent Labs  Lab 12/10/17 2012 12/10/17 2024 12/13/17 0437  WBC 7.7  --  9.7  NEUTROABS 5.1  --   --   HGB 13.2 12.9* 13.0  HCT 41.4 38.0* 39.8  MCV 93.0  --  92.3  PLT 215  --  431   Basic Metabolic Panel: Recent Labs  Lab 12/10/17 2012 12/10/17 2024 12/13/17 0437  NA 138 139 141  K 3.8 3.6 4.0  CL 99 99 106  CO2 27  --  26  GLUCOSE 106* 105* 109*  BUN 19 21 17   CREATININE 1.11 1.10 1.05  CALCIUM 9.7  --  9.4   GFR: Estimated Creatinine Clearance: 38.3 mL/min (by C-G formula based on SCr of 1.05 mg/dL). Liver Function Tests: Recent Labs  Lab 12/10/17 2012  AST 26  ALT 18  ALKPHOS 67  BILITOT 0.5  PROT 7.2  ALBUMIN 3.8   No results for input(s): LIPASE, AMYLASE in the last 168 hours. No results for input(s): AMMONIA in the last 168 hours. Coagulation Profile: Recent Labs  Lab 12/10/17 2012 12/13/17 0437  INR 1.01 1.02   Cardiac Enzymes: No results for input(s): CKTOTAL, CKMB, CKMBINDEX, TROPONINI in the last 168 hours. BNP (last 3 results) No results for input(s): PROBNP in the last 8760 hours. HbA1C: Recent Labs    12/11/17 0427  HGBA1C 6.3*   CBG: No results for input(s): GLUCAP in the last 168 hours. Lipid Profile: Recent Labs    12/11/17 0427  CHOL 155  HDL 34*  LDLCALC 49  TRIG 359*  CHOLHDL 4.6   Thyroid Function Tests: No results for input(s): TSH, T4TOTAL, FREET4, T3FREE, THYROIDAB in the last 72 hours. Anemia Panel: No results for input(s): VITAMINB12, FOLATE, FERRITIN, TIBC, IRON, RETICCTPCT in the last 72 hours. Sepsis Labs: No results for input(s): PROCALCITON, LATICACIDVEN in the last 168 hours.  No results found for this or any previous visit (from the past 240 hour(s)).       Radiology  Studies: No results found.      Scheduled Meds: .  stroke: mapping our early stages of recovery book   Does not apply Once  . aspirin EC  81 mg Oral Daily  . clopidogrel  75 mg Oral Daily  . fentaNYL      . gemfibrozil  300 mg Oral BID AC  . heparin      . levothyroxine  88 mcg Oral QAC breakfast  . lidocaine      . midazolam      . multivitamins with iron  1 tablet Oral Daily  . predniSONE  5 mg Oral Q breakfast   Continuous Infusions: . sodium chloride 75 mL/hr at 12/13/17 1402     LOS: 2 days    Time spent: 35 minutes.     [place],  MD [facility] Pager [phone]  If 7PM-7AM, please contact night-coverage [email or web address] Password [facility] 12/13/2017, 2:41 PM

## 2017-12-13 NOTE — Progress Notes (Signed)
STROKE TEAM PROGRESS NOTE   INTERVAL HISTORY His  Daughter and son in law are at bedside.He reports he has not had another transient episode since 12/02/17 at 10:30 AM  He had cath angio today showing near occlusive left terminal vertebral and moderate prox basilar stenosis  Vitals:   12/13/17 1515 12/13/17 1545 12/13/17 1645 12/13/17 1745  BP: 135/63 (!) 144/66 (!) 159/71 (!) 144/67  Pulse: (!) 54 (!) 57 63 68  Resp: 12 16 12 16   Temp: 97.6 F (36.4 C) 97.7 F (36.5 C) 97.8 F (36.6 C) (!) 97.5 F (36.4 C)  TempSrc: Oral Oral Oral Oral  SpO2: 98% 100% 98% 100%  Weight:      Height:        CBC:  Recent Labs  Lab 12/10/17 2012 12/10/17 2024 12/13/17 0437  WBC 7.7  --  9.7  NEUTROABS 5.1  --   --   HGB 13.2 12.9* 13.0  HCT 41.4 38.0* 39.8  MCV 93.0  --  92.3  PLT 215  --  664    Basic Metabolic Panel:  Recent Labs  Lab 12/10/17 2012 12/10/17 2024 12/13/17 0437  NA 138 139 141  K 3.8 3.6 4.0  CL 99 99 106  CO2 27  --  26  GLUCOSE 106* 105* 109*  BUN 19 21 17   CREATININE 1.11 1.10 1.05  CALCIUM 9.7  --  9.4   Lipid Panel:     Component Value Date/Time   CHOL 155 12/11/2017 0427   TRIG 359 (H) 12/11/2017 0427   HDL 34 (L) 12/11/2017 0427   CHOLHDL 4.6 12/11/2017 0427   VLDL 72 (H) 12/11/2017 0427   LDLCALC 49 12/11/2017 0427   HgbA1c:  Lab Results  Component Value Date   HGBA1C 6.3 (H) 12/11/2017   Urine Drug Screen: No results found for: LABOPIA, COCAINSCRNUR, LABBENZ, AMPHETMU, THCU, LABBARB  Alcohol Level No results found for: ETH  IMAGING No results found. 2D Echocardiogram  pending  Cerebral angio :   4 vessel cerebral arteriogram 1. Preocclusive severe stenosis 95 % plus of  dominant Lt VBJ, ,and  70 % of the prox basilar artery. 2.Occluded non dominant RT VBJ at the RT PICA 3.Approx  30 %  Stenosis of dominant  LT VA at origin.   PHYSICAL EXAM Pleasant elderly Asian male not in distress. . Afebrile. Head is nontraumatic. Neck is supple  without bruit.    Cardiac exam no murmur or gallop. Lungs are clear to auscultation. Distal pulses are well felt. Neurological Exam ;  Awake  Alert oriented x 3. Normal speech and language.eye movements full without nystagmus.fundi were not visualized. Vision acuity and fields appear normal. Hearing is normal. Palatal movements are normal. Face symmetric. Tongue midline. Normal strength, tone, reflexes and coordination. Normal sensation. Gait deferred.  ASSESSMENT/PLAN [name] is a 84 y.o. [name] with history of HTN and colon cancer presenting with intermittent L hemiparesis that worsens with dizziness for the past several weeks.  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 Small vessel disease. Atrophy.   CTA head & neck no LVO. Severe VB disase (LVA near occlusion, hypoplastic R V4 occludes prior to VBJ, BA narrow, PCAs perfused, robust R PCA, V1 atherosclerosis) additional mild atherosclerosis   MRI  Scattered bilateral cerebellar and R pontine infarcts, Small vessel disease progressed since 2014 2D Echo  Left ventricle:  The cavity size was normal. There was mild focal   basal hypertrophy of the septum. Systolic function was normal.   The estimated ejection fraction was in the range of 60% to 65%.   Wall motion was normal; there were no regional wall motion    abnormalities.  LDL 49  HgbA1c 6.3  EKGs NSR  SCDs for VTE prophylaxis  aspirin 81 mg daily prior to admission, now on aspirin 325 mg daily. Given mild stroke, will place on aspirin 81 mg and plavix 75 mg daily x 3 weeks, then Plavix alone   Therapy recommendations:  HH PT, RW w/ 5" wheels, no OT or SLP  Disposition:  Return home with family  Hypertension  Stable . Permissive hypertension (OK if < 220/120) but gradually normalize in 5-7 days . Long-term BP goal  normotensive  Other Stroke Risk Factors  Advanced age  Hospital day # 2       He presented with recurrent episodes of dizziness, vertigo and left-sided numbness secondary to pontine and bilateral cerebellar infarcts likely symptomatic from high-grade vertebral basilar stenosis.  Recommend dual antiplatelet therapy for 3 months and aggressive risk factor modification.Lond d/w patient and family about risk benefit of PTA/stenting and answered questions. Plan elective angioplasty/stenting next Tuesday per Dr Estanislado Pandy if stable or sooner if recurrent symptoms. Long discussion with the patient,, his daughter and Dr. Tyrell Antonio and answered questions.     Greater than 50% time during this 35-minute visit was spent in counseling and coordination of care about his brainstem and cerebral strokes, basilar stenosis and answering questions  Antony Contras, MD Medical Director [place] Pager: [phone] 12/13/2017 6:13 PM  To contact Stroke Continuity provider, please refer to [email or web address] After hours, contact General Neurology

## 2017-12-13 NOTE — Plan of Care (Signed)
Pt walking around in the room, stable gait and working with therapy

## 2017-12-14 LAB — PLATELET INHIBITION P2Y12: Platelet Function  P2Y12: 218 [PRU] (ref 194–418)

## 2017-12-14 MED ORDER — CLOPIDOGREL BISULFATE 75 MG PO TABS: 75.0000 mg | ORAL_TABLET | Freq: Every day | ORAL | 0 refills | Status: DC

## 2017-12-14 NOTE — Care Management Note (Signed)
Case Management Note  Patient Details  Name: [name] MRN: [id number] Date of Birth: Mar 05, 1929  Subjective/Objective:                    Action/Plan: Pt discharging home with [facility] services. CM provided the daughter choice and she selected [facility]. Cory with [facility] notified and accepted the referral. Contact is to be the patients daughter [name]: [phone]  And his PCP is Dr Karle Starch at [facility] and Tommi Rumps is aware.  Pt has DME ordered in his room for d/c.   Expected Discharge Date:  12/14/17               Expected Discharge Plan:  [place]  In-House Referral:     Discharge planning Services  CM Consult  Post Acute Care Choice:  Home Health, Durable Medical Equipment Choice offered to:  Patient, Adult Children  DME Arranged:  3-N-1, Walker rolling DME Agency:  [place]:  PT [place]:  [place]  Status of Service:  Completed, signed off  If discussed at [place] of Stay Meetings, dates discussed:    Additional Comments:  Pollie Friar, RN 12/14/2017, 1:21 PM

## 2017-12-14 NOTE — Plan of Care (Signed)
  Problem: Activity: Goal: Risk for activity intolerance will decrease Outcome: Progressing   

## 2017-12-14 NOTE — Progress Notes (Signed)
NURSING PROGRESS NOTE  [name] [id number] Discharge Data: 12/14/2017 2:14 PM Attending Provider: Shelly Coss, MD PCP:No primary care provider on file.     [name] to be D/C'd Home per MD order.  Discussed with the patient the After Visit Summary and all questions fully answered. All IV's discontinued with no bleeding noted. All belongings returned to patient for patient to take home.   Last Vital Signs:  Blood pressure 124/63, pulse 62, temperature 97.8 F (36.6 C), temperature source Oral, resp. rate 16, height 5\' 2"  (1.575 m), weight 55.5 kg (122 lb 5.7 oz), SpO2 96 %.  Discharge Medication List Allergies as of 12/14/2017      Reactions   Asa [aspirin] Other (See Comments)   Gastric symptoms can tolerate the baby ASA but no more than that. Pt. Is taking ASA 81mg  now.   Penicillin G Rash      Medication List    STOP taking these medications   losartan 25 MG tablet Commonly known as:  COZAAR   sildenafil 100 MG tablet Commonly known as:  VIAGRA     TAKE these medications   aspirin 81 MG tablet Take 81 mg by mouth daily.   clopidogrel 75 MG tablet Commonly known as:  PLAVIX Take 1 tablet (75 mg total) by mouth daily.   fluticasone 50 MCG/ACT nasal spray Commonly known as:  FLONASE Place 2 sprays into the nose daily as needed for allergies.   gemfibrozil 600 MG tablet Commonly known as:  LOPID Take 0.5 tablets (300 mg total) by mouth 2 (two) times daily before a meal.   levothyroxine 88 MCG tablet Commonly known as:  SYNTHROID, LEVOTHROID Take 88 mcg by mouth daily.   losartan-hydrochlorothiazide 50-12.5 MG tablet Commonly known as:  HYZAAR TAKE ONE TABLET BY MOUTH ONE TIME DAILY   methotrexate 2.5 MG tablet Take 15 mg by mouth once a week.   multivitamin per tablet Take 1 tablet by mouth daily.   predniSONE 5 MG tablet Commonly known as:  DELTASONE Take 5 mg by mouth daily with breakfast.   Vitamin D (Ergocalciferol) 50000 units Caps  capsule Commonly known as:  DRISDOL Take 50,000 Units by mouth once a week.            Durable Medical Equipment  (From admission, onward)        Start     Ordered   12/12/17 1051  For home use only DME Walker rolling  Once    Comments:  youth  Question:  Patient needs a walker to treat with the following condition  Answer:  Stroke ([place])   12/12/17 1051   12/12/17 1050  For home use only DME 3 n 1  Once     12/12/17 1051

## 2017-12-14 NOTE — Discharge Summary (Signed)
Physician Discharge Summary  [name] PJA:[id number] DOB: [birth year] DOA: 12/10/2017  PCP: No primary care provider on file.  Admit date: 12/10/2017 Discharge date: 12/14/2017  Admitted From: Home Disposition:  Home  Discharge Condition:Stable CODE STATUS:FULL Diet recommendation: Heart Healthy   Brief/Interim Summary:  Patient is a 84 y.o.malewith medical history significant ofhypertension, rheumatoid arthritis comes in with 3 weeks of waxing and waning left-sided numbness and weakness that has been occurring on and off. He reported that he gets very numb the whole entire left side he cannot move it which is been causing frequent falls. Patient admitted with acute stroke, he presents with numbness left side, weakness and dizziness. He had MRI which showed  positive for cerebral hemisphere stroke, and CTA showed basilar artery stenosis. He develop a recurrent episode while in the hospital on 7-24. Symptoms have resolved . He was refer for cerebral angiogram which  showed pre-occlusive severe stenosis 95 % plus of dominant VBJ and 70 % of the proximal basilar artery,occluded non dominant RT VBJ at the RT PICA. approx 30 % stenosis of dominant LT VA origin.  He will follow up with  Dr Estanislado Pandy as an outpatient with plan elective angioplasty/stenting next Tuesday.  Patient evaluated by PT and recommended home health PT. He is stable to be discharged to home today.  Following problems were addressed during his hospitalization:   1-Acute stroke; both cerebral hemispheres -LDL 49, triglyceride 359, EKG; sinus.  HB A1c 6.3.  -CT angio head and neck: Negative CTA for large vessel occlusion. 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 Hypoplastic right V4 segment occludes prior to the vertebrobasilar junction. Basilar artery diffusely narrowed with multifocal atheromatous irregularity. PCAs are well perfused, with  predominant fetal type origin of the left PCA, with a fairly robust right posterior communicating artery as well. -MRI: Scattered small acute infarcts in both cerebral hemispheres, and the dorsal right pons. No associated hemorrhage or mass effect. Chronic supratentorial signal changes suggestive of small vessel disease have mildly progressed since 2014. -Started  aspirin , plavix which will be continued for 3 months.  -Started  on gemfibrozil -ECHO showed diastolic dysfunction, grade one,. -He had an apisode of left side numbness and weakness, transiently.  Angiogram done. -Plan is to   discharge and he will need to follow up with Dr Bronson Curb for stent out patient.   2-HTN: Permissive HTN: Resume home meds  3)-RA: Resume prednisone.   4) Hypothyroidism: resume synthroid,.        Discharge Diagnoses:  Principal Problem:   TIA (transient ischemic attack) Active Problems:   Essential hypertension    Discharge Instructions  Discharge Instructions    Ambulatory referral to Neurology   Complete by:  As directed    An appointment is requested in approximately: 4 weeks   Diet - low sodium heart healthy   Complete by:  As directed    Discharge instructions   Complete by:  As directed    1) Follow up with PCP in a week. 2) Take prescribed medications as instructed. 3) Follow up with Neurology as an outpatient.Name and number of the provider has been attached.   Increase activity slowly   Complete by:  As directed      Allergies as of 12/14/2017      Reactions   Asa [aspirin] Other (See Comments)   Gastric symptoms can tolerate the baby ASA but no more than that. Pt. Is taking ASA 81mg  now.  Penicillin G Rash      Medication List    STOP taking these medications   losartan 25 MG tablet Commonly known as:  COZAAR   sildenafil 100 MG tablet Commonly known as:  VIAGRA     TAKE these medications   aspirin 81 MG tablet Take 81 mg by mouth daily.   clopidogrel  75 MG tablet Commonly known as:  PLAVIX Take 1 tablet (75 mg total) by mouth daily.   fluticasone 50 MCG/ACT nasal spray Commonly known as:  FLONASE Place 2 sprays into the nose daily as needed for allergies.   gemfibrozil 600 MG tablet Commonly known as:  LOPID Take 0.5 tablets (300 mg total) by mouth 2 (two) times daily before a meal.   levothyroxine 88 MCG tablet Commonly known as:  SYNTHROID, LEVOTHROID Take 88 mcg by mouth daily.   losartan-hydrochlorothiazide 50-12.5 MG tablet Commonly known as:  HYZAAR TAKE ONE TABLET BY MOUTH ONE TIME DAILY   methotrexate 2.5 MG tablet Take 15 mg by mouth once a week.   multivitamin per tablet Take 1 tablet by mouth daily.   predniSONE 5 MG tablet Commonly known as:  DELTASONE Take 5 mg by mouth daily with breakfast.   Vitamin D (Ergocalciferol) 50000 units Caps capsule Commonly known as:  DRISDOL Take 50,000 Units by mouth once a week.            Durable Medical Equipment  (From admission, onward)        Start     Ordered   12/12/17 1051  For home use only DME Walker rolling  Once    Comments:  youth  Question:  Patient needs a walker to treat with the following condition  Answer:  Stroke ([place])   12/12/17 1051   12/12/17 1050  For home use only DME 3 n 1  Once     12/12/17 1051     Follow-up Information    Garvin Fila, MD. Schedule an appointment as soon as possible for a visit in 4 week(s).   Specialties:  Neurology, Radiology Contact information: [address] [place] [address] [phone]          Allergies  Allergen Reactions  . Asa [Aspirin] Other (See Comments)    Gastric symptoms can tolerate the baby ASA but no more than that. Pt. Is taking ASA 81mg  now.  . Penicillin G Rash    Consultations: neurology  Procedures/Studies: Ct Angio Head W Or Wo Contrast  Result Date: 12/11/2017 CLINICAL DATA:  Initial evaluation for intermittent left-sided weakness for several weeks.  EXAM: CT ANGIOGRAPHY HEAD AND NECK TECHNIQUE: Multidetector CT imaging of the head and neck was performed using the standard protocol during bolus administration of intravenous contrast. Multiplanar CT image reconstructions and MIPs were obtained to evaluate the vascular anatomy. Carotid stenosis measurements (when applicable) are obtained utilizing NASCET criteria, using the distal internal carotid diameter as the denominator. CONTRAST:  47mL ISOVUE-370 IOPAMIDOL (ISOVUE-370) INJECTION 76% COMPARISON:  Prior CT from 12/10/2017 FINDINGS: CTA NECK FINDINGS Aortic arch: Visualized aortic arch of normal caliber with normal 3 vessel morphology. Moderate atheromatous plaque within the visualize arch and about the origin of the great vessels without hemodynamically significant stenosis. Visualized subclavian arteries widely patent. Right carotid system: Atheromatous irregularity within the right common carotid artery without hemodynamically significant stenosis. No significant atheromatous narrowing about the right carotid bifurcation. Right ICA widely patent distally to the skull base without stenosis, dissection, or occlusion. Left carotid system:  Left common carotid artery patent from its origin to the bifurcation without hemodynamically significant stenosis. No significant atheromatous narrowing about the left bifurcation. Left ICA patent from the bifurcation to the skull base without stenosis, dissection, or occlusion. Vertebral arteries: Both of the vertebral arteries arise from the subclavian arteries. Left vertebral artery dominant. Atheromatous stenoses with up to approximate 50% narrowing at the origin of the left vertebral artery. Moderate to severe tandem stenoses noted within the pre foraminal right V1 segment (series 8, images 259, 245). Vertebral arteries otherwise patent within the neck without high-grade stenosis or occlusion. Skeleton: No acute osseus abnormality. No discrete lytic or blastic osseous  lesions. Mild multilevel cervical spondylolysis and facet arthrosis noted within the cervical spine. Other neck: No acute soft tissue abnormality within the neck. No adenopathy. Salivary glands normal. Thyroid appears to be absent. Upper chest: Visualized upper chest demonstrates no acute finding. Partially visualized lungs are clear. Review of the MIP images confirms the above findings CTA HEAD FINDINGS Anterior circulation: Petrous segments widely patent bilaterally. Mild atheromatous plaque within the cavernous/para clinoid ICAs without hemodynamically significant stenosis. ICA termini widely patent. A1 segments patent bilaterally. Right A1 slightly hypoplastic. Anterior communicating artery patent and normal. Anterior cerebral arteries widely patent to their distal aspects without stenosis. M1 segments mildly irregular but widely patent bilaterally without stenosis. Normal MCA bifurcations. No proximal M2 occlusion distal MCA branches well opacified and symmetric Posterior circulation: Left vertebral artery dominant. There are severe tandem near occlusive stenoses involving the left V4 segment. The hypoplastic right vertebral artery is markedly irregular and attenuated as it courses into the cranial vault, and occludes just beyond the takeoff of the right posterior inferior cerebral artery. Posterior inferior cerebral arteries themselves are patent bilaterally. Basilar artery is diffusely narrowed and somewhat attenuated with multifocal atheromatous irregularity, but is patent to its distal aspect. Superior cerebral arteries patent bilaterally. Right PCA supplied via the basilar as well as a small right posterior communicating artery. Predominant fetal type origin of the left PCA, although a small hypoplastic left P1 segment is visualized. PCAs patent to their distal aspects, although demonstrate distal small vessel atheromatous irregularity. Venous sinuses: Patent. Anatomic variants: Predominant fetal type  origin of the left PCA. Delayed phase: No abnormal enhancement. Review of the MIP images confirms the above findings IMPRESSION: 1. Negative CTA for large vessel occlusion. 2. 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 Hypoplastic right V4 segment occludes prior to the vertebrobasilar junction. Basilar artery diffusely narrowed with multifocal atheromatous irregularity. PCAs are well perfused, with predominant fetal type origin of the left PCA, with a fairly robust right posterior communicating artery as well. 3. Additional moderate to advanced stenoses involving the proximal V1 segments as above. 4. Additional mild for age atherosclerotic change about the carotid bifurcations and carotid siphons without hemodynamically significant stenosis. Electronically Signed   By: Jeannine Boga M.D.   On: 12/11/2017 03:18   Ct Head Wo Contrast  Result Date: 12/10/2017 CLINICAL DATA:  Dizziness.  Left arm and leg numbness. EXAM: CT HEAD WITHOUT CONTRAST TECHNIQUE: Contiguous axial images were obtained from the base of the skull through the vertex without intravenous contrast. COMPARISON:  11/27/2017 FINDINGS: Brain: There is atrophy and chronic small vessel disease changes. No acute intracranial abnormality. Specifically, no hemorrhage, hydrocephalus, mass lesion, acute infarction, or significant intracranial injury. Vascular: No hyperdense vessel or unexpected calcification. Skull: No acute calvarial abnormality. Sinuses/Orbits: No acute findings Other: None IMPRESSION: No acute  intracranial abnormality. Atrophy, chronic microvascular disease. Electronically Signed   By: Rolm Baptise M.D.   On: 12/10/2017 20:42   Ct Angio Neck W Or Wo Contrast  Result Date: 12/11/2017 CLINICAL DATA:  Initial evaluation for intermittent left-sided weakness for several weeks. EXAM: CT ANGIOGRAPHY HEAD AND NECK TECHNIQUE: Multidetector CT imaging of the  head and neck was performed using the standard protocol during bolus administration of intravenous contrast. Multiplanar CT image reconstructions and MIPs were obtained to evaluate the vascular anatomy. Carotid stenosis measurements (when applicable) are obtained utilizing NASCET criteria, using the distal internal carotid diameter as the denominator. CONTRAST:  23mL ISOVUE-370 IOPAMIDOL (ISOVUE-370) INJECTION 76% COMPARISON:  Prior CT from 12/10/2017 FINDINGS: CTA NECK FINDINGS Aortic arch: Visualized aortic arch of normal caliber with normal 3 vessel morphology. Moderate atheromatous plaque within the visualize arch and about the origin of the great vessels without hemodynamically significant stenosis. Visualized subclavian arteries widely patent. Right carotid system: Atheromatous irregularity within the right common carotid artery without hemodynamically significant stenosis. No significant atheromatous narrowing about the right carotid bifurcation. Right ICA widely patent distally to the skull base without stenosis, dissection, or occlusion. Left carotid system: Left common carotid artery patent from its origin to the bifurcation without hemodynamically significant stenosis. No significant atheromatous narrowing about the left bifurcation. Left ICA patent from the bifurcation to the skull base without stenosis, dissection, or occlusion. Vertebral arteries: Both of the vertebral arteries arise from the subclavian arteries. Left vertebral artery dominant. Atheromatous stenoses with up to approximate 50% narrowing at the origin of the left vertebral artery. Moderate to severe tandem stenoses noted within the pre foraminal right V1 segment (series 8, images 259, 245). Vertebral arteries otherwise patent within the neck without high-grade stenosis or occlusion. Skeleton: No acute osseus abnormality. No discrete lytic or blastic osseous lesions. Mild multilevel cervical spondylolysis and facet arthrosis noted within  the cervical spine. Other neck: No acute soft tissue abnormality within the neck. No adenopathy. Salivary glands normal. Thyroid appears to be absent. Upper chest: Visualized upper chest demonstrates no acute finding. Partially visualized lungs are clear. Review of the MIP images confirms the above findings CTA HEAD FINDINGS Anterior circulation: Petrous segments widely patent bilaterally. Mild atheromatous plaque within the cavernous/para clinoid ICAs without hemodynamically significant stenosis. ICA termini widely patent. A1 segments patent bilaterally. Right A1 slightly hypoplastic. Anterior communicating artery patent and normal. Anterior cerebral arteries widely patent to their distal aspects without stenosis. M1 segments mildly irregular but widely patent bilaterally without stenosis. Normal MCA bifurcations. No proximal M2 occlusion distal MCA branches well opacified and symmetric Posterior circulation: Left vertebral artery dominant. There are severe tandem near occlusive stenoses involving the left V4 segment. The hypoplastic right vertebral artery is markedly irregular and attenuated as it courses into the cranial vault, and occludes just beyond the takeoff of the right posterior inferior cerebral artery. Posterior inferior cerebral arteries themselves are patent bilaterally. Basilar artery is diffusely narrowed and somewhat attenuated with multifocal atheromatous irregularity, but is patent to its distal aspect. Superior cerebral arteries patent bilaterally. Right PCA supplied via the basilar as well as a small right posterior communicating artery. Predominant fetal type origin of the left PCA, although a small hypoplastic left P1 segment is visualized. PCAs patent to their distal aspects, although demonstrate distal small vessel atheromatous irregularity. Venous sinuses: Patent. Anatomic variants: Predominant fetal type origin of the left PCA. Delayed phase: No abnormal enhancement. Review of the MIP  images confirms the above findings IMPRESSION: 1. Negative  CTA for large vessel occlusion. 2. 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 Hypoplastic right V4 segment occludes prior to the vertebrobasilar junction. Basilar artery diffusely narrowed with multifocal atheromatous irregularity. PCAs are well perfused, with predominant fetal type origin of the left PCA, with a fairly robust right posterior communicating artery as well. 3. Additional moderate to advanced stenoses involving the proximal V1 segments as above. 4. Additional mild for age atherosclerotic change about the carotid bifurcations and carotid siphons without hemodynamically significant stenosis. Electronically Signed   By: Jeannine Boga M.D.   On: 12/11/2017 03:18   Mr Brain Wo Contrast  Result Date: 12/11/2017 CLINICAL DATA:  84 year old male with waxing and waning left side numbness and weakness. CTA head and neck earlier today positive for severe stenosis, near occlusion of the dominant left vertebral artery. EXAM: MRI HEAD WITHOUT CONTRAST TECHNIQUE: Multiplanar, multiecho pulse sequences of the brain and surrounding structures were obtained without intravenous contrast. COMPARISON:  CTA head and neck 0119 hours today. Brain MRI 11/18/2012. FINDINGS: Brain: Patchy and confluent restricted diffusion in both cerebellar hemispheres, more extensive on the right (series 3, image 6). The PICA territories are primarily affected. There is borderline involvement of the right SCA territory. There is also a punctate focus of restricted diffusion in the right dorsal pons (series 3, image 12). Mild associated T2 and FLAIR hyperintensity in the in the acutely affected areas with no hemorrhage or mass effect. No other No restricted diffusion or evidence of acute infarction. No acute or chronic cerebral blood products identified. Moderate chronic scattered bilateral cerebral  white matter T2 and FLAIR hyperintensity has mildly progressed since 2014. No midline shift, mass effect, evidence of mass lesion, ventriculomegaly, extra-axial collection or acute intracranial hemorrhage. Cervicomedullary junction and pituitary are within normal limits. Vascular: Major intracranial vascular flow voids are stable since 2014. Skull and upper cervical spine: Degenerative ligamentous hypertrophy about the odontoid. Stable visible cervical spine and osseous structures. Sinuses/Orbits: Stable and negative. Other: Mastoids are clear. Visible internal auditory structures appear normal. Scalp and face soft tissues are negative. IMPRESSION: 1. 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 No associated hemorrhage or mass effect. 2. Chronic supratentorial signal changes suggestive of small vessel disease have mildly progressed since 2014. Electronically Signed   By: Genevie Ann M.D.   On: 12/11/2017 13:14       Subjective: Patient seen and examined the bedside this morning.  He does not have any residual weakness currently.  His speech is clear.  Is stable for discharge home today.  Discharge Exam: Vitals:   12/14/17 0449 12/14/17 0813  BP: 135/65 124/63  Pulse: (!) 57 62  Resp: 18 16  Temp: 97.7 F (36.5 C) 97.8 F (36.6 C)  SpO2: 94% 96%   Vitals:   12/13/17 2012 12/14/17 0000 12/14/17 0449 12/14/17 0813  BP: 131/65 (!) 114/48 135/65 124/63  Pulse: 61 (!) 58 (!) 57 62  Resp: 18 18 18 16   Temp: 97.8 F (36.6 C) 98.4 F (36.9 C) 97.7 F (36.5 C) 97.8 F (36.6 C)  TempSrc: Oral Oral Oral Oral  SpO2: 95% 95% 94% 96%  Weight:      Height:        General: Pt is alert, awake, not in acute distress Cardiovascular: RRR, S1/S2 +, no rubs, no gallops Respiratory: CTA bilaterally, no wheezing, no rhonchi Abdominal: Soft, NT, ND, bowel sounds + Extremities: no edema, no cyanosis  The results of significant diagnostics from this  hospitalization (including imaging, microbiology, ancillary and laboratory) are listed below for reference.     Microbiology: No results found for this or any previous visit (from the past 240 hour(s)).   Labs: BNP (last 3 results) No results for input(s): BNP in the last 8760 hours. Basic Metabolic Panel: Recent Labs  Lab 12/10/17 2012 12/10/17 2024 12/13/17 0437  NA 138 139 141  K 3.8 3.6 4.0  CL 99 99 106  CO2 27  --  26  GLUCOSE 106* 105* 109*  BUN 19 21 17   CREATININE 1.11 1.10 1.05  CALCIUM 9.7  --  9.4   Liver Function Tests: Recent Labs  Lab 12/10/17 2012  AST 26  ALT 18  ALKPHOS 67  BILITOT 0.5  PROT 7.2  ALBUMIN 3.8   No results for input(s): LIPASE, AMYLASE in the last 168 hours. No results for input(s): AMMONIA in the last 168 hours. CBC: Recent Labs  Lab 12/10/17 2012 12/10/17 2024 12/13/17 0437  WBC 7.7  --  9.7  NEUTROABS 5.1  --   --   HGB 13.2 12.9* 13.0  HCT 41.4 38.0* 39.8  MCV 93.0  --  92.3  PLT 215  --  208   Cardiac Enzymes: No results for input(s): CKTOTAL, CKMB, CKMBINDEX, TROPONINI in the last 168 hours. BNP: Invalid input(s): POCBNP CBG: No results for input(s): GLUCAP in the last 168 hours. D-Dimer No results for input(s): DDIMER in the last 72 hours. Hgb A1c No results for input(s): HGBA1C in the last 72 hours. Lipid Profile No results for input(s): CHOL, HDL, LDLCALC, TRIG, CHOLHDL, LDLDIRECT in the last 72 hours. Thyroid function studies No results for input(s): TSH, T4TOTAL, T3FREE, THYROIDAB in the last 72 hours.  Invalid input(s): FREET3 Anemia work up No results for input(s): VITAMINB12, FOLATE, FERRITIN, TIBC, IRON, RETICCTPCT in the last 72 hours. Urinalysis    Component Value Date/Time   COLORURINE yellow 03/17/2010 1010   APPEARANCEUR Clear 03/17/2010 1010   LABSPEC 1.010 03/17/2010 1010   PHURINE 7.5 03/17/2010 1010   HGBUR negative 03/17/2010 1010   BILIRUBINUR negative 03/17/2010 1010   UROBILINOGEN  0.2 03/17/2010 1010   NITRITE negative 03/17/2010 1010   Sepsis Labs Invalid input(s): PROCALCITONIN,  WBC,  LACTICIDVEN Microbiology No results found for this or any previous visit (from the past 240 hour(s)).  Please note: You were cared for by a hospitalist during your hospital stay. Once you are discharged, your primary care physician will handle any further medical issues. Please note that NO REFILLS for any discharge medications will be authorized once you are discharged, as it is imperative that you return to your primary care physician (or establish a relationship with a primary care physician if you do not have one) for your post hospital discharge needs so that they can reassess your need for medications and monitor your lab values.    Time coordinating discharge: 40 minutes  SIGNED:   Shelly Coss, MD  [facility] 12/14/2017, 12:29 PM Pager [phone]  If 7PM-7AM, please contact night-coverage [email or web address] Password [email or web address]

## 2017-12-14 NOTE — Progress Notes (Signed)
STROKE TEAM PROGRESS NOTE   INTERVAL HISTORY His  Daughter is at bedside.He reports he has not had another transient episode since 12/02/17 at 10:30 AM  He  Is walking in the room and ready to go home.  Vitals:   12/13/17 2012 12/14/17 0000 12/14/17 0449 12/14/17 0813  BP: 131/65 (!) 114/48 135/65 124/63  Pulse: 61 (!) 58 (!) 57 62  Resp: 18 18 18 16   Temp: 97.8 F (36.6 C) 98.4 F (36.9 C) 97.7 F (36.5 C) 97.8 F (36.6 C)  TempSrc: Oral Oral Oral Oral  SpO2: 95% 95% 94% 96%  Weight:      Height:        CBC:  Recent Labs  Lab 12/10/17 2012 12/10/17 2024 12/13/17 0437  WBC 7.7  --  9.7  NEUTROABS 5.1  --   --   HGB 13.2 12.9* 13.0  HCT 41.4 38.0* 39.8  MCV 93.0  --  92.3  PLT 215  --  267    Basic Metabolic Panel:  Recent Labs  Lab 12/10/17 2012 12/10/17 2024 12/13/17 0437  NA 138 139 141  K 3.8 3.6 4.0  CL 99 99 106  CO2 27  --  26  GLUCOSE 106* 105* 109*  BUN 19 21 17   CREATININE 1.11 1.10 1.05  CALCIUM 9.7  --  9.4   Lipid Panel:     Component Value Date/Time   CHOL 155 12/11/2017 0427   TRIG 359 (H) 12/11/2017 0427   HDL 34 (L) 12/11/2017 0427   CHOLHDL 4.6 12/11/2017 0427   VLDL 72 (H) 12/11/2017 0427   LDLCALC 49 12/11/2017 0427   HgbA1c:  Lab Results  Component Value Date   HGBA1C 6.3 (H) 12/11/2017   Urine Drug Screen: No results found for: LABOPIA, COCAINSCRNUR, LABBENZ, AMPHETMU, THCU, LABBARB  Alcohol Level No results found for: ETH  IMAGING No results found. 2D Echocardiogram  Left ventricle: The cavity size was normal. There was mild focal   basal hypertrophy of the septum. Systolic function was normal.   The estimated ejection fraction was in the range of 60% to 65%.   Wall motion was normal; there were no regional wall motion   abnormalities.  Cerebral angio :   4 vessel cerebral arteriogram 1. Preocclusive severe stenosis 95 % plus of  dominant Lt VBJ, ,and  70 % of the prox basilar artery. 2.Occluded non dominant RT VBJ at  the RT PICA 3.Approx  30 %  Stenosis of dominant  LT VA at origin.   PHYSICAL EXAM Pleasant elderly Asian male not in distress. . Afebrile. Head is nontraumatic. Neck is supple without bruit.    Cardiac exam no murmur or gallop. Lungs are clear to auscultation. Distal pulses are well felt. Neurological Exam ;  Awake  Alert oriented x 3. Normal speech and language.eye movements full without nystagmus.fundi were not visualized. Vision acuity and fields appear normal. Hearing is normal. Palatal movements are normal. Face symmetric. Tongue midline. Normal strength, tone, reflexes and coordination. Normal sensation. Gait deferred.  ASSESSMENT/PLAN [name] is a 84 y.o. [name] with history of HTN and colon cancer presenting with intermittent L hemiparesis that worsens with dizziness for the past several weeks.  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 Small vessel disease. Atrophy.   CTA head & neck no LVO. 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 There was mild focal   basal hypertrophy of the septum. Systolic function was normal.   The estimated ejection fraction was in the range of 60% to 65%.   Wall motion was normal; there were no regional wall motion    abnormalities.  LDL 49  HgbA1c 6.3  EKGs NSR  SCDs for VTE prophylaxis  aspirin 81 mg daily prior to admission, now on aspirin 325 mg daily. Given mild stroke, will place on aspirin 81 mg and plavix 75 mg daily x 3 weeks, then Plavix alone   Therapy recommendations:  HH PT, RW w/ 5" wheels, no OT or  SLP  Disposition:  Return home with family  Hypertension  Stable . Permissive hypertension (OK if < 220/120) but gradually normalize in 5-7 days . Long-term BP goal normotensive  Other Stroke Risk Factors  Advanced age  Hospital day # 3       He presented with recurrent episodes of dizziness, vertigo and left-sided numbness secondary to pontine and bilateral cerebellar infarcts likely symptomatic from high-grade vertebral basilar stenosis.  Recommend dual antiplatelet therapy for 3 months and aggressive risk factor modification.Long d/w patient and his daughter about risk benefit of PTA/stenting and answered questions. Plan elective angioplasty/stenting next Tuesday per Dr Estanislado Pandy if stable or sooner if recurrent symptoms. Long discussion with the patient,, his daughter and Dr. [name]  and answered questions.     Greater than 50% time during this 25-minute visit was spent in counseling and coordination of care about his brainstem and cerebral strokes, basilar stenosis and answering questions  Antony Contras, [facility] Pager: [phone] 12/14/2017 11:40 AM  To contact Stroke Continuity provider, please refer to [email or web address] After hours, contact General Neurology

## 2017-12-14 NOTE — Care Management Important Message (Signed)
Important Message  Patient Details  Name: [name] MRN: [id number] Date of Birth: 12/07/1928   Medicare Important Message Given:  Yes    [name] Montine Circle 12/14/2017, 2:51 PM

## 2017-12-14 NOTE — Progress Notes (Signed)
Physical Therapy Treatment Patient Details Name: [name] MRN: [id number] DOB: 04-10-1929 Today's Date: 12/14/2017    History of Present Illness 84 year old male with recurrent episodes of left-sided numbness and weakness    PT Comments    Patient is making progress toward PT goals and tolerated session well. Current plan remains appropriate.    Follow Up Recommendations  Home health PT;Supervision for mobility/OOB     Equipment Recommendations  Pt has all equipment needed    Recommendations for Other Services       Precautions / Restrictions Precautions Precautions: Fall    Mobility  Bed Mobility Overal bed mobility: Needs Assistance Bed Mobility: Supine to Sit     Supine to sit: Min assist     General bed mobility comments: patient's daughter assisted pt to EOB  Transfers Overall transfer level: Needs assistance Equipment used: Quad cane Transfers: Sit to/from Stand Sit to Stand: Min guard         General transfer comment: min guard for safety; no physical assistance required; pt stood without AD and used AD upon standing   Ambulation/Gait Ambulation/Gait assistance: Counsellor (Feet): 200 Feet Assistive device: Quad cane Gait Pattern/deviations: Step-through pattern;Decreased stride length Gait velocity: decreased   General Gait Details: slow, grossly steady gait; pt with one instance of L knee buckling but no LOB or physical assistance required to recover   Stairs Stairs: Yes Stairs assistance: Min guard Stair Management: Two rails Number of Stairs: 2 General stair comments: increased time and effort   Wheelchair Mobility    Modified Rankin (Stroke Patients Only) Modified Rankin (Stroke Patients Only) Pre-Morbid Rankin Score: No symptoms Modified Rankin: Moderate disability     Balance Overall balance assessment: Needs assistance;History of Falls   Sitting balance-Leahy Scale: Good     Standing balance support: Single  extremity supported;During functional activity Standing balance-Leahy Scale: Fair Standing balance comment: pt is able to static stand without an assistive device                            Cognition Arousal/Alertness: Awake/alert Behavior During Therapy: WFL for tasks assessed/performed Overall Cognitive Status: Within Functional Limits for tasks assessed                                        Exercises      General Comments General comments (skin integrity, edema, etc.): daughter present       Pertinent Vitals/Pain Pain Assessment: No/denies pain    Home Living                      Prior Function            PT Goals (current goals can now be found in the care plan section) Acute Rehab PT Goals PT Goal Formulation: With patient/family Time For Goal Achievement: 12/25/17 Potential to Achieve Goals: Good Progress towards PT goals: Progressing toward goals    Frequency    Min 3X/week      PT Plan Current plan remains appropriate    Co-evaluation              AM-PAC PT "6 Clicks" Daily Activity  Outcome Measure  Difficulty turning over in bed (including adjusting bedclothes, sheets and blankets)?: A Little Difficulty moving from lying on back to sitting on the side of the bed? :  Unable Difficulty sitting down on and standing up from a chair with arms (e.g., wheelchair, bedside commode, etc,.)?: A Little Help needed moving to and from a bed to chair (including a wheelchair)?: A Little Help needed walking in hospital room?: A Little Help needed climbing 3-5 steps with a railing? : A Little 6 Click Score: 16    End of Session Equipment Utilized During Treatment: Gait belt Activity Tolerance: Patient tolerated treatment well Patient left: in chair;with call bell/phone within reach;with family/visitor present Nurse Communication: Mobility status PT Visit Diagnosis: Unsteadiness on feet (R26.81);History of falling  (Z91.81)     Time: 2376-2831 PT Time Calculation (min) (ACUTE ONLY): 21 min  Charges:  $Gait Training: 8-22 mins                     Earney Navy, PTA Pager: [phone]     Darliss Cheney 12/14/2017, 9:25 AM

## 2017-12-17 ENCOUNTER — Encounter (HOSPITAL_COMMUNITY): Payer: Self-pay | Admitting: Interventional Radiology

## 2017-12-17 ENCOUNTER — Other Ambulatory Visit: Payer: Self-pay

## 2017-12-17 ENCOUNTER — Other Ambulatory Visit (HOSPITAL_COMMUNITY): Payer: Self-pay | Admitting: Interventional Radiology

## 2017-12-17 ENCOUNTER — Other Ambulatory Visit: Payer: Self-pay | Admitting: Student

## 2017-12-17 ENCOUNTER — Other Ambulatory Visit: Payer: Self-pay | Admitting: Radiology

## 2017-12-17 ENCOUNTER — Telehealth (HOSPITAL_COMMUNITY): Payer: Self-pay

## 2017-12-17 DIAGNOSIS — I771 Stricture of artery: Secondary | ICD-10-CM

## 2017-12-17 NOTE — Telephone Encounter (Signed)
Called to give pt's daughter appt info, no answer, left vm. AW

## 2017-12-18 ENCOUNTER — Other Ambulatory Visit (HOSPITAL_COMMUNITY): Payer: Medicare Other

## 2017-12-18 ENCOUNTER — Other Ambulatory Visit: Payer: Self-pay

## 2017-12-18 ENCOUNTER — Encounter (HOSPITAL_COMMUNITY)
Admission: AD | Disposition: A | Discharge status: Home / Self Care | Payer: Self-pay | Source: Home / Self Care | Attending: Interventional Radiology

## 2017-12-18 ENCOUNTER — Ambulatory Visit (HOSPITAL_COMMUNITY): Payer: Medicare Other | Admitting: Anesthesiology

## 2017-12-18 ENCOUNTER — Inpatient Hospital Stay (HOSPITAL_COMMUNITY)
Admission: AD | Admit: 2017-12-18 | Discharge: 2017-12-19 | DRG: 027 | Disposition: A | Discharge status: Home / Self Care | Payer: Medicare Other | Attending: Interventional Radiology | Admitting: Interventional Radiology

## 2017-12-18 ENCOUNTER — Encounter (HOSPITAL_COMMUNITY): Payer: Self-pay | Admitting: Anesthesiology

## 2017-12-18 ENCOUNTER — Ambulatory Visit (HOSPITAL_COMMUNITY)
Admission: RE | Admit: 2017-12-18 | Discharge: 2017-12-18 | Disposition: A | Discharge status: Home / Self Care | Payer: Medicare Other | Source: Ambulatory Visit | Attending: Interventional Radiology | Admitting: Interventional Radiology

## 2017-12-18 DIAGNOSIS — Z8673 Personal history of transient ischemic attack (TIA), and cerebral infarction without residual deficits: Secondary | ICD-10-CM | POA: Diagnosis not present

## 2017-12-18 DIAGNOSIS — E039 Hypothyroidism, unspecified: Secondary | ICD-10-CM | POA: Diagnosis present

## 2017-12-18 DIAGNOSIS — E785 Hyperlipidemia, unspecified: Secondary | ICD-10-CM | POA: Diagnosis present

## 2017-12-18 DIAGNOSIS — I10 Essential (primary) hypertension: Secondary | ICD-10-CM | POA: Diagnosis present

## 2017-12-18 DIAGNOSIS — Z79890 Hormone replacement therapy (postmenopausal): Secondary | ICD-10-CM | POA: Diagnosis not present

## 2017-12-18 DIAGNOSIS — Z7952 Long term (current) use of systemic steroids: Secondary | ICD-10-CM | POA: Diagnosis not present

## 2017-12-18 DIAGNOSIS — Z9049 Acquired absence of other specified parts of digestive tract: Secondary | ICD-10-CM

## 2017-12-18 DIAGNOSIS — I771 Stricture of artery: Secondary | ICD-10-CM

## 2017-12-18 DIAGNOSIS — Z9221 Personal history of antineoplastic chemotherapy: Secondary | ICD-10-CM

## 2017-12-18 DIAGNOSIS — Z85038 Personal history of other malignant neoplasm of large intestine: Secondary | ICD-10-CM

## 2017-12-18 DIAGNOSIS — M069 Rheumatoid arthritis, unspecified: Secondary | ICD-10-CM | POA: Diagnosis present

## 2017-12-18 DIAGNOSIS — I651 Occlusion and stenosis of basilar artery: Secondary | ICD-10-CM

## 2017-12-18 DIAGNOSIS — Z7902 Long term (current) use of antithrombotics/antiplatelets: Secondary | ICD-10-CM | POA: Diagnosis not present

## 2017-12-18 DIAGNOSIS — Z7982 Long term (current) use of aspirin: Secondary | ICD-10-CM | POA: Diagnosis not present

## 2017-12-18 DIAGNOSIS — I6322 Cerebral infarction due to unspecified occlusion or stenosis of basilar arteries: Secondary | ICD-10-CM | POA: Diagnosis present

## 2017-12-18 DIAGNOSIS — F329 Major depressive disorder, single episode, unspecified: Secondary | ICD-10-CM | POA: Diagnosis present

## 2017-12-18 DIAGNOSIS — H9190 Unspecified hearing loss, unspecified ear: Secondary | ICD-10-CM | POA: Diagnosis present

## 2017-12-18 HISTORY — PX: IR PTA INTRACRANIAL: IMG2344

## 2017-12-18 HISTORY — PX: RADIOLOGY WITH ANESTHESIA: SHX6223

## 2017-12-18 HISTORY — DX: Depression, unspecified: F32.A

## 2017-12-18 HISTORY — DX: Cerebral infarction, unspecified: I63.9

## 2017-12-18 HISTORY — DX: Major depressive disorder, single episode, unspecified: F32.9

## 2017-12-18 HISTORY — DX: Hyperlipidemia, unspecified: E78.5

## 2017-12-18 HISTORY — DX: Unspecified injury of head, initial encounter: S09.90XA

## 2017-12-18 LAB — BASIC METABOLIC PANEL
Anion gap: 13 (ref 5–15)
BUN: 16 mg/dL (ref 8–23)
CO2: 23 mmol/L (ref 22–32)
Calcium: 9.1 mg/dL (ref 8.9–10.3)
Chloride: 106 mmol/L (ref 98–111)
Creatinine, Ser: 1.06 mg/dL (ref 0.61–1.24)
GFR calc Af Amer: >60 mL/min (ref >60)
GFR calc non Af Amer: >60 mL/min (ref >60)
Glucose, Bld: 94 mg/dL (ref 70–99)
Potassium: 4.1 mmol/L (ref 3.5–5.1)
Sodium: 142 mmol/L (ref 135–145)

## 2017-12-18 LAB — CBC WITH DIFFERENTIAL/PLATELET
Abs Immature Granulocytes: 0.1 10*3/uL (ref 0.0–0.1)
Basophils Absolute: 0 10*3/uL (ref 0.0–0.1)
Basophils Relative: 0 %
Eosinophils Absolute: 0 10*3/uL (ref 0.0–0.7)
Eosinophils Relative: 0 %
HCT: 41.5 % (ref 39.0–52.0)
Hemoglobin: 13.3 g/dL (ref 13.0–17.0)
Immature Granulocytes: 1 %
Lymphocytes Relative: 17 %
Lymphs Abs: 1.5 10*3/uL (ref 0.7–4.0)
MCH: 30.1 pg (ref 26.0–34.0)
MCHC: 32 g/dL (ref 30.0–36.0)
MCV: 93.9 fL (ref 78.0–100.0)
Monocytes Absolute: 0.8 10*3/uL (ref 0.1–1.0)
Monocytes Relative: 9 %
Neutro Abs: 6.4 10*3/uL (ref 1.7–7.7)
Neutrophils Relative %: 73 %
Platelets: 215 10*3/uL (ref 150–400)
RBC: 4.42 MIL/uL (ref 4.22–5.81)
RDW: 14.6 % (ref 11.5–15.5)
WBC: 8.8 10*3/uL (ref 4.0–10.5)

## 2017-12-18 LAB — PROTIME-INR
INR: 1.03
Prothrombin Time: 13.4 seconds (ref 11.4–15.2)

## 2017-12-18 LAB — POCT ACTIVATED CLOTTING TIME
Activated Clotting Time: 224 seconds
Activated Clotting Time: 235 seconds

## 2017-12-18 LAB — PLATELET INHIBITION P2Y12: Platelet Function  P2Y12: 178 [PRU] — ABNORMAL LOW (ref 194–418)

## 2017-12-18 LAB — APTT: aPTT: 33 seconds (ref 24–36)

## 2017-12-18 SURGERY — IR WITH ANESTHESIA
Anesthesia: General

## 2017-12-18 MED ORDER — ROCURONIUM BROMIDE 10 MG/ML (PF) SYRINGE
PREFILLED_SYRINGE | INTRAVENOUS | Status: DC | PRN
  Administered 2017-12-18: 20 mg via INTRAVENOUS
  Administered 2017-12-18: 50 mg via INTRAVENOUS

## 2017-12-18 MED ORDER — VANCOMYCIN HCL 1000 MG IV SOLR
1000.0000 mg | INTRAVENOUS | Status: AC
  Administered 2017-12-18: 1000 mg via INTRAVENOUS
  Filled 2017-12-18: qty 1000

## 2017-12-18 MED ORDER — NIMODIPINE 30 MG PO CAPS: 0.0000 mg | ORAL_CAPSULE | ORAL | Status: DC

## 2017-12-18 MED ORDER — TICAGRELOR 90 MG PO TABS
180.0000 mg | ORAL_TABLET | Freq: Once | ORAL | Status: AC
  Administered 2017-12-18: 180 mg via ORAL
  Filled 2017-12-18: qty 2

## 2017-12-18 MED ORDER — LACTATED RINGERS IV SOLN
INTRAVENOUS | Status: DC | PRN
  Administered 2017-12-18: 11:00:00 via INTRAVENOUS

## 2017-12-18 MED ORDER — HEPARIN (PORCINE) IN NACL 100-0.45 UNIT/ML-% IJ SOLN
INTRAMUSCULAR | Status: AC
  Filled 2017-12-18: qty 250

## 2017-12-18 MED ORDER — SODIUM CHLORIDE 0.9 % IV SOLN
INTRAVENOUS | Status: DC | PRN
  Administered 2017-12-18: 15 ug/min via INTRAVENOUS

## 2017-12-18 MED ORDER — NITROGLYCERIN 1 MG/10 ML FOR IR/CATH LAB
INTRA_ARTERIAL | Status: AC
  Filled 2017-12-18: qty 10

## 2017-12-18 MED ORDER — ACETAMINOPHEN 160 MG/5ML PO SOLN: 650.0000 mg | ORAL | Status: DC | PRN

## 2017-12-18 MED ORDER — PROPOFOL 10 MG/ML IV BOLUS
INTRAVENOUS | Status: DC | PRN
  Administered 2017-12-18: 20 mg via INTRAVENOUS
  Administered 2017-12-18: 10 mg via INTRAVENOUS
  Administered 2017-12-18 (×2): 20 mg via INTRAVENOUS
  Administered 2017-12-18: 100 mg via INTRAVENOUS
  Administered 2017-12-18: 20 mg via INTRAVENOUS

## 2017-12-18 MED ORDER — PHENYLEPHRINE 40 MCG/ML (10ML) SYRINGE FOR IV PUSH (FOR BLOOD PRESSURE SUPPORT)
PREFILLED_SYRINGE | INTRAVENOUS | Status: DC | PRN
  Administered 2017-12-18 (×2): 40 ug via INTRAVENOUS
  Administered 2017-12-18: 80 ug via INTRAVENOUS
  Administered 2017-12-18 (×4): 40 ug via INTRAVENOUS

## 2017-12-18 MED ORDER — GLYCOPYRROLATE PF 0.2 MG/ML IJ SOSY
PREFILLED_SYRINGE | INTRAMUSCULAR | Status: DC | PRN
  Administered 2017-12-18 (×2): .2 mg via INTRAVENOUS

## 2017-12-18 MED ORDER — ACETAMINOPHEN 650 MG RE SUPP: 650.0000 mg | RECTAL | Status: DC | PRN

## 2017-12-18 MED ORDER — NITROGLYCERIN 0.2 MG/ML ON CALL CATH LAB
INTRAVENOUS | Status: DC | PRN
  Administered 2017-12-18 (×2): 20 ug via INTRAVENOUS

## 2017-12-18 MED ORDER — SODIUM CHLORIDE 0.9 % IV SOLN
INTRAVENOUS | Status: DC
  Administered 2017-12-18 (×2): via INTRAVENOUS

## 2017-12-18 MED ORDER — CLOPIDOGREL BISULFATE 75 MG PO TABS: 75.0000 mg | ORAL_TABLET | ORAL | Status: DC

## 2017-12-18 MED ORDER — ACETAMINOPHEN 325 MG PO TABS
650.0000 mg | ORAL_TABLET | ORAL | Status: DC | PRN
  Administered 2017-12-18: 650 mg via ORAL
  Filled 2017-12-18: qty 2

## 2017-12-18 MED ORDER — IOHEXOL 300 MG/ML  SOLN
300.0000 mL | Freq: Once | INTRAMUSCULAR | Status: AC | PRN
  Administered 2017-12-18: 100 mL via INTRA_ARTERIAL

## 2017-12-18 MED ORDER — NICARDIPINE HCL IN NACL 20-0.86 MG/200ML-% IV SOLN
0.0000 mg/h | INTRAVENOUS | Status: DC
  Filled 2017-12-18: qty 200

## 2017-12-18 MED ORDER — LABETALOL HCL 5 MG/ML IV SOLN
INTRAVENOUS | Status: DC | PRN
  Administered 2017-12-18: 5 mg via INTRAVENOUS

## 2017-12-18 MED ORDER — HEPARIN (PORCINE) IN NACL 100-0.45 UNIT/ML-% IJ SOLN
450.0000 [IU]/h | INTRAMUSCULAR | Status: DC
  Administered 2017-12-18: 450 [IU]/h via INTRAVENOUS
  Administered 2017-12-18: 500 [IU]/h via INTRAVENOUS
  Filled 2017-12-18: qty 250

## 2017-12-18 MED ORDER — PROTAMINE SULFATE 10 MG/ML IV SOLN
INTRAVENOUS | Status: DC | PRN
  Administered 2017-12-18: 5 mg via INTRAVENOUS

## 2017-12-18 MED ORDER — TICAGRELOR 90 MG PO TABS
90.0000 mg | ORAL_TABLET | Freq: Two times a day (BID) | ORAL | Status: DC
  Administered 2017-12-19: 90 mg via ORAL
  Filled 2017-12-18: qty 1

## 2017-12-18 MED ORDER — DEXAMETHASONE SODIUM PHOSPHATE 10 MG/ML IJ SOLN
INTRAMUSCULAR | Status: DC | PRN
  Administered 2017-12-18: 10 mg via INTRAVENOUS

## 2017-12-18 MED ORDER — ASPIRIN 81 MG PO CHEW
81.0000 mg | CHEWABLE_TABLET | Freq: Every day | ORAL | Status: DC
  Administered 2017-12-19: 81 mg via ORAL
  Filled 2017-12-18: qty 1

## 2017-12-18 MED ORDER — HEPARIN SODIUM (PORCINE) 1000 UNIT/ML IJ SOLN
INTRAMUSCULAR | Status: DC | PRN
  Administered 2017-12-18: 3000 [IU] via INTRAVENOUS

## 2017-12-18 MED ORDER — ASPIRIN 81 MG PO CHEW: 81.0000 mg | CHEWABLE_TABLET | Freq: Every day | ORAL | Status: DC

## 2017-12-18 MED ORDER — EPTIFIBATIDE 20 MG/10ML IV SOLN
INTRAVENOUS | Status: AC
  Filled 2017-12-18: qty 10

## 2017-12-18 MED ORDER — TICAGRELOR 90 MG PO TABS: 90.0000 mg | ORAL_TABLET | Freq: Two times a day (BID) | ORAL | Status: DC

## 2017-12-18 MED ORDER — NIMODIPINE 30 MG PO CAPS
ORAL_CAPSULE | ORAL | Status: AC
  Filled 2017-12-18: qty 2

## 2017-12-18 MED ORDER — SODIUM CHLORIDE 0.9 % IV SOLN
INTRAVENOUS | Status: DC
  Administered 2017-12-18 (×2): via INTRAVENOUS

## 2017-12-18 NOTE — Procedures (Signed)
S/P Lt VA angiogram followed by endovascular revascularization of distal VBJ and prox basilar artery with approx 60 % patency.

## 2017-12-18 NOTE — Sedation Documentation (Signed)
Pulses and wound handed off to [employer], Therapist, sports, PACU.

## 2017-12-18 NOTE — Transfer of Care (Signed)
Immediate Anesthesia Transfer of Care Note  Patient: [name]  Procedure(s) Performed: Angioplasty with stenting (N/A )  Patient Location: PACU  Anesthesia Type:General  Level of Consciousness: awake, alert , oriented and patient cooperative  Airway & Oxygen Therapy: Patient Spontanous Breathing and Patient connected to nasal cannula oxygen  Post-op Assessment: Report given to RN, Post -op Vital signs reviewed and stable and Patient moving all extremities X 4  Post vital signs: Reviewed and stable  Last Vitals:  Vitals Value Taken Time  BP 121/63 12/18/2017  1:15 PM  Temp    Pulse 72 12/18/2017  1:22 PM  Resp 19 12/18/2017  1:22 PM  SpO2 97 % 12/18/2017  1:22 PM  Vitals shown include unvalidated device data.  Last Pain:  Vitals:   12/18/17 0851  TempSrc:   PainSc: 0-No pain         Complications: No apparent anesthesia complications

## 2017-12-18 NOTE — Progress Notes (Signed)
Arterial Line displaying whip.  Attempted to adjust, re-zero, and flush, but A-line BP still inconsistent wave form.  Cuff BP within desired parameters.  Will continue to monitor pressures based off BP cuff.

## 2017-12-18 NOTE — Progress Notes (Addendum)
ANTICOAGULATION CONSULT NOTE - Initial Consult  Pharmacy Consult:  Heparin Indication:  Post interventional neuroradiology procedure  Allergies  Allergen Reactions  . Asa [Aspirin] Other (See Comments)    Gastric symptoms can tolerate the baby ASA but no more than that. Pt. Is taking ASA 81mg  now.  . Penicillin G Rash    Patient Measurements: Height: 5\' 2"  (157.5 cm) Weight: 122 lb (55.3 kg) IBW/kg (Calculated) : 54.6 Heparin Dosing Weight: 55 kg  Vital Signs: Temp: 97.7 F (36.5 C) (07/30 1315) Temp Source: Oral (07/30 0831) BP: 144/67 (07/30 1500) Pulse Rate: 67 (07/30 1500)  Labs: Recent Labs    12/18/17 0901  HGB 13.3  HCT 41.5  PLT 215  APTT 33  LABPROT 13.4  INR 1.03  CREATININE 1.06    Estimated Creatinine Clearance: 37.9 mL/min (by C-G formula based on SCr of 1.06 mg/dL).   Medical History: Past Medical History:  Diagnosis Date  . Adenocarcinoma ([place]) 1985   colon s/p right hemicolectomy and chemotherapy. F/U with cancer center and Dr. Lucia Gaskins rotinely  . Depression   . Eczema   . Epistaxis    f/u per ENT  . Erectile dysfunction   . Gastric ulcer 12/11   H-Pylori Tx, EGD 3-12: gastritis  . Hay fever   . Head injury 11/2017   with fall  . Hyperlipemia   . Hypertension   . Hypothyroidism    Hypothyroid  . Insomnia   . Osteopenia     per DEXA 07-2008 (Rx fosamax)  . Rheumatoid arthritis ([place])   . Stroke [facility])     " balance issue"     Assessment: 56 YOM with recent CVA and discharged home last week.  Patient returned today for endovascular revascularization and was started on IV heparin in the PACU.  Patient then started to have bleeding at the left groin site and heparin was held.  RN reported that there is currently bleeding/oozing after foley insertion.   Goal of Therapy:  Heparin level 0.1 - 0.25 units/mL Monitor platelets by anticoagulation protocol: Yes    Plan:  Heparin currently on hold.  RN contacting MD for further  instruction F/U with plans   Eyob Godlewski D. Mina Marble, PharmD, BCPS, [place] 12/18/2017, 3:39 PM   ======================================   Addendum: RN reported that MD wants heparin to be restarted  Plan:  Restart heparin gtt at 450 units/hr Check 8 hr heparin level Monitor closely for bleeding Stop heparin tomorrow at 0800 per protocol   Renato Spellman D. Mina Marble, PharmD, BCPS, [place] 12/18/2017, 3:51 PM

## 2017-12-18 NOTE — Progress Notes (Signed)
Patient ID: [name], male   DOB: February 22, 1929, 84 y.o.   MRN: [id number] INR  Patient extubated without difficulty. C/O a frontal HA NO N/V or visual symptoms. Oriented to place. Obeys simple commands appropriately. Pupils 49mm equal though sluggish. No facial asymmetry.[place] midline. Motor equal  proportional l to effort. Lt groin soft Distal pulses palpable in both feet Dps and PTs. S.Tomi Grandpre MD

## 2017-12-18 NOTE — H&P (Deleted)
  The note originally documented on this encounter has been moved the the encounter in which it belongs.  

## 2017-12-18 NOTE — Sedation Documentation (Signed)
EKG shown to Dr. Estanislado Pandy.

## 2017-12-18 NOTE — Progress Notes (Signed)
Saw patient in PACU following procedure. Patient underwent an image-guided cerebral angiogram with distal vertebrobasilar junction/proximal basilar artery angioplasty.  Patient awake and alert laying in bed with no complaints at this time. Denies headache, weakness, dizziness, numbness/tingling, vision changes, hearing changes, tinnitus, or speech difficulty.  Alert, awake, and oriented x3. Speech and comprehension intact. PERRL bilaterally. EOMs intact bilaterally without nystagmus or subjective diplopia. Visual fields not assessed. No facial asymmetry. Tongue midline. Motor power symmetric proportional to effort. Pronator drift not assessed. Fine motor and coordination not assessed. Gait not assessed. Romberg not assessed. Heel to toe not assessed. Distal pulses 2+ bilaterally. Left groin incision soft with V-pad and pressure dressing intact, no signs of active bleeding or hematoma.  Plan to transfer to neuro ICU for overnight observation. Re-start Heparin once on neuro ICU floor. Patient to remain flat until 1900 tonight- then raise HOB to 30 degrees. Patient to keep left leg straight until 0700 tomorrow. IR to follow.  Bea Graff Trung Wenzl, PA-C 12/18/2017, 3:47 PM

## 2017-12-18 NOTE — Anesthesia Procedure Notes (Signed)
Procedure Name: Intubation Date/Time: 12/18/2017 10:50 AM Performed by: Renato Shin, CRNA Pre-anesthesia Checklist: Patient identified, Emergency Drugs available, Suction available and Patient being monitored Patient Re-evaluated:Patient Re-evaluated prior to induction Oxygen Delivery Method: Circle system utilized Preoxygenation: Pre-oxygenation with 100% oxygen Induction Type: IV induction Ventilation: Mask ventilation without difficulty Laryngoscope Size: Miller and 2 Grade View: Grade I Tube type: Oral Tube size: 7.0 mm Number of attempts: 1 Airway Equipment and Method: Stylet Placement Confirmation: ETT inserted through vocal cords under direct vision,  positive ETCO2,  CO2 detector and breath sounds checked- equal and bilateral Secured at: 21 cm Tube secured with: Tape Dental Injury: Teeth and Oropharynx as per pre-operative assessment

## 2017-12-18 NOTE — Progress Notes (Signed)
When checking the pt lt groin site it has slight bleeding on gauze dressing and hematoma formation. Gentle pressure held to sight. Dr. Estanislado Pandy updated per staff member in radiology.

## 2017-12-18 NOTE — Progress Notes (Signed)
Dr. Estanislado Pandy tech [place] at bedside

## 2017-12-18 NOTE — Progress Notes (Signed)
PACU RN notified Dr.Deveshwar that there was bleeding at the left groin site as well as a hematoma along the superior medial aspect of the left groin. I was notified and began holding manual pressure for 25 min starting at 1425 hrs. A v-pad was applied and hemostasis was achieved at 1500 hrs. No signs of a hematoma was present at this time. Hemostasis and groin site was checked by Lattie Haw R.N at this time.

## 2017-12-18 NOTE — Anesthesia Postprocedure Evaluation (Signed)
Anesthesia Post Note  Patient: [name]  Procedure(s) Performed: Angioplasty with stenting (N/A )     Patient location during evaluation: PACU Anesthesia Type: General Level of consciousness: awake and alert Pain management: pain level controlled Vital Signs Assessment: post-procedure vital signs reviewed and stable Respiratory status: spontaneous breathing, nonlabored ventilation, respiratory function stable and patient connected to nasal cannula oxygen Cardiovascular status: blood pressure returned to baseline and stable Postop Assessment: no apparent nausea or vomiting Anesthetic complications: no    Last Vitals:  Vitals:   12/18/17 1330 12/18/17 1345  BP: (!) 114/57 129/62  Pulse: 72 69  Resp: 19 18  Temp:    SpO2: 97% 99%    Last Pain:  Vitals:   12/18/17 1345  TempSrc:   PainSc: 0-No pain                 Lai Hendriks

## 2017-12-18 NOTE — Anesthesia Procedure Notes (Signed)
Arterial Line Insertion Start/End7/30/2019 10:15 AM, 12/18/2017 10:15 AM Performed by: CRNA  Patient location: Pre-op. Preanesthetic checklist: patient identified, IV checked, site marked, risks and benefits discussed, surgical consent, monitors and equipment checked, pre-op evaluation, timeout performed and anesthesia consent Left, radial was placed Catheter size: 20 G Hand hygiene performed , maximum sterile barriers used  and Seldinger technique used  Attempts: 2 Procedure performed without using ultrasound guided technique. Following insertion, dressing applied and Biopatch. Post procedure assessment: normal  Patient tolerated the procedure well with no immediate complications.

## 2017-12-18 NOTE — Anesthesia Preprocedure Evaluation (Signed)
Anesthesia Evaluation  Patient identified by MRN, date of birth, ID band Patient awake    Reviewed: Allergy & Precautions, NPO status , Patient's Chart, lab work & pertinent test results  History of Anesthesia Complications Negative for: history of anesthetic complications  Airway Mallampati: III  TM Distance: >3 FB Neck ROM: Full    Dental  (+) Edentulous Upper, Edentulous Lower   Pulmonary    breath sounds clear to auscultation       Cardiovascular hypertension, Pt. on medications (-) angina(-) Past MI and (-) CHF  Rhythm:Regular     Neuro/Psych PSYCHIATRIC DISORDERS Depression TIACVA    GI/Hepatic Neg liver ROS, PUD,   Endo/Other  Hypothyroidism   Renal/GU      Musculoskeletal  (+) Arthritis ,   Abdominal   Peds  Hematology   Anesthesia Other Findings - neg stress 2010  Left ventricle: The cavity size was normal. There was mild focal   basal hypertrophy of the septum. Systolic function was normal.   The estimated ejection fraction was in the range of 60% to 65%.   Wall motion was normal; there were no regional wall motion   abnormalities. Doppler parameters are consistent with abnormal   left ventricular relaxation (grade 1 diastolic dysfunction).   Doppler parameters are consistent with high ventricular filling   pressure. - Aortic valve: Transvalvular velocity was within the normal range.   There was no stenosis. There was mild regurgitation. Valve area   (VTI): 2.22 cm^2. Valve area (Vmean): 1.94 cm^2. - Mitral valve: Transvalvular velocity was within the normal range.   There was no evidence for stenosis. There was trivial   regurgitation. - Right ventricle: The cavity size was normal. Wall thickness was   normal. Systolic function was normal. - Atrial septum: No defect or patent foramen ovale was identified   by color flow Doppler. - Tricuspid valve: There was mild regurgitation. - Pulmonary  arteries: Systolic pressure was within the normal   range. PA peak pressure: 23 mm Hg (S).   Reproductive/Obstetrics                             Anesthesia Physical Anesthesia Plan  ASA: III  Anesthesia Plan: General   Post-op Pain Management:    Induction: Intravenous  PONV Risk Score and Plan: 2 and Ondansetron and Dexamethasone  Airway Management Planned: Oral ETT  Additional Equipment: Arterial line  Intra-op Plan:   Post-operative Plan: Extubation in OR  Informed Consent: I have reviewed the patients History and Physical, chart, labs and discussed the procedure including the risks, benefits and alternatives for the proposed anesthesia with the patient or authorized representative who has indicated his/her understanding and acceptance.   Dental advisory given  Plan Discussed with: CRNA and Surgeon  Anesthesia Plan Comments:         Anesthesia Quick Evaluation

## 2017-12-18 NOTE — Progress Notes (Signed)
Patient ID: Karol Liendo, male   DOB: March 27, 1929, 84 y.o.   MRN: [id number] INR. 35 Y M admitted today for endovascular revascularization of symptomatic severe stenosis of distal Lt VBJ and possibly the prox basilar artery with  angioplasty and or stent under GA . Clinically since discharge patient has had no new neurological symptoms of dplopia blurring or blindness ,or of speech ,motor or sensory abnormalities. Denies any H/As,N/V ,swollowing difficulties or passing out spells. Has been ambulating with a cane because of balance difficulties related to his most recent cerebellar ischemic strokes. Denies any chest pains or SOB or chills or fever.   O/E hard of hearing .Otherwise alert ,awake oriented to place and month. Pupils 64mm r t = lt. EOMS  Full. NO facial asymmetry. Tongue midline  Zig zagging with Lt finger to nose. Mild Lt UE pronation drift. Power  Equal bilaterally proportional to effort..  Procedure ,benefits and  alternatives again reviewed with patient and daughter. Risks of ICH,with neurological decline,vent dependency,death,inabilty to revascularize ,vascular injury all discussed with the patient and daughter..The patient and the family expressed understanding and wish to proceed. Informed witnessed consent obtained.  Also the daughter stated that Mr Decoteau had expressed to his family not to have aggrresive  measures undertaken in the event of a severe stroke or hemorrhage. S.Lucile Didonato MD

## 2017-12-18 NOTE — H&P (Signed)
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  Referring Physician(s): Garvin Fila  Supervising Physician: Luanne Bras  Patient Status: [facility] - Out-pt  History of Present Illness: [name] is a 84 y.o. male with a past medical history of hypertension, hyperlipidemia, CVA 11/2017, gastric ulcer, adenocacinoma, ED, hypothyroidism, eczema, osteopenia, rheumatoid arthritis, insomnia, and depression. He presented to ED 12/10/2017 with complaints of dizziness and left-sided weakness. He was then admitted for a CVA and underwent an image-guided diagnostic cerebral angiogram with Dr. Estanislado Pandy 12/13/2017.  Diagnostic cerebral angiogram 12/13/2017: 1. 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 2. Approximately 30% stenosis of the dominant left vertebral artery proximally. 3. Approximately 50% stenosis of the non dominant right vertebral artery proximally. 4. Right non dominant vertebral artery terminates in the right posterior-inferior cerebellar artery.  Patient presents today for an image-guided cerebral angiogram with possible left vertebrobasilar junction and proximal basilar artery stenosis angioplasty/stent placement. Patient awake and alert laying in bed with no complaints at this time. Denies fever, chills, chest pain, dyspnea, abdominal pain, dizziness, headache, weakness, numbness/tingling, vision changes, hearing changes, tinnitus, or speech difficulty.   Past Medical History:  Diagnosis Date  . Adenocarcinoma ([place]) 1985   colon s/p right hemicolectomy and chemotherapy. F/U with cancer center and Dr. Lucia Gaskins rotinely  . Depression   . Eczema   . Epistaxis    f/u per ENT  . Erectile dysfunction   . Gastric ulcer 12/11   H-Pylori Tx, EGD 3-12: gastritis  . Hay fever   . Head injury 11/2017   with fall    . Hyperlipemia   . Hypertension   . Hypothyroidism    Hypothyroid  . Insomnia   . Osteopenia     per DEXA 07-2008 (Rx fosamax)  . Rheumatoid arthritis ([place])   . Stroke [facility])     " balance issue"    Past Surgical History:  Procedure Laterality Date  . COLON SURGERY     Right / Adenocarcinoma / Chemo  . COLONOSCOPY  2005, 2008   Dr. Lucia Gaskins  . INGUINAL HERNIA REPAIR     Right  . IR ANGIO INTRA EXTRACRAN SEL COM CAROTID INNOMINATE BILAT MOD SED  12/13/2017  . IR ANGIO VERTEBRAL SEL VERTEBRAL BILAT MOD SED  12/13/2017    Allergies: Asa [aspirin] and Penicillin g  Medications: Prior to Admission medications   Medication Sig Start Date End Date Taking? Authorizing Provider  aspirin 81 MG tablet Take 81 mg by mouth daily.      [provider]  clopidogrel (PLAVIX) 75 MG tablet Take 1 tablet (75 mg total) by mouth daily. 12/14/17   Shelly Coss, MD  fluticasone (FLONASE) 50 MCG/ACT nasal spray Place 2 sprays into the nose daily as needed for allergies.     [provider]  gemfibrozil (LOPID) 600 MG tablet Take 0.5 tablets (300 mg total) by mouth 2 (two) times daily before a meal. 12/12/17   Regalado, Belkys A, MD  levothyroxine (SYNTHROID, LEVOTHROID) 88 MCG tablet Take 88 mcg by mouth daily. 10/20/16 12/18/17  [provider]  losartan-hydrochlorothiazide (HYZAAR) 50-12.5 MG per tablet TAKE ONE TABLET BY MOUTH ONE TIME DAILY 02/23/12   Colon Branch, MD  methotrexate 2.5 MG tablet Take 15 mg by mouth once a week.    [provider]  multivitamin [facility]) per tablet Take 1 tablet by  mouth daily.      [provider]  predniSONE (DELTASONE) 5 MG tablet Take 5 mg by mouth daily with breakfast.    [provider]  Vitamin D, Ergocalciferol, (DRISDOL) 50000 units CAPS capsule Take 50,000 Units by mouth once a week. 08/25/16   [provider]     Family History  Problem Relation Age of Onset  . Diabetes Neg Hx   . Heart attack  Neg Hx   . Colon cancer Neg Hx   . Prostate cancer Neg Hx     Social History   Socioeconomic History  . Marital status: Married    Spouse name: Not on file  . Number of children: 2  . Years of education: Not on file  . Highest education level: Not on file  Occupational History    Employer: RETIRED  Social Needs  . Financial resource strain: Not on file  . Food insecurity:    Worry: Not on file    Inability: Not on file  . Transportation needs:    Medical: Not on file    Non-medical: Not on file  Tobacco Use  . Smoking status: Never Smoker  . Smokeless tobacco: Never Used  Substance and Sexual Activity  . Alcohol use: No    Alcohol/week: 0.0 oz  . Drug use: No  . Sexual activity: Not on file  Lifestyle  . Physical activity:    Days per week: Not on file    Minutes per session: Not on file  . Stress: Not on file  Relationships  . Social connections:    Talks on phone: Not on file    Gets together: Not on file    Attends religious service: Not on file    Active member of club or organization: Not on file    Attends meetings of clubs or organizations: Not on file    Relationship status: Not on file  Other Topics Concern  . Not on file  Social History Narrative   Original from [place]   Vegetarian   Daily caffeine use one per day     Review of Systems: A 12 point ROS discussed and pertinent positives are indicated in the HPI above.  All other systems are negative.  Review of Systems  Constitutional: Negative for chills and fever.  HENT: Negative for hearing loss and tinnitus.   Eyes: Negative for visual disturbance.  Respiratory: Negative for shortness of breath and wheezing.   Cardiovascular: Negative for chest pain and palpitations.  Gastrointestinal: Negative for abdominal pain.  Neurological: Negative for dizziness, speech difficulty, weakness, numbness and headaches.  Psychiatric/Behavioral: Negative for behavioral problems and confusion.    Vital  Signs: There were no vitals taken for this visit.  Physical Exam  Constitutional: He is oriented to person, place, and time. He appears well-developed and well-nourished. No distress.  Cardiovascular: Normal rate, regular rhythm and normal heart sounds.  No murmur heard. Pulmonary/Chest: Effort normal and breath sounds normal. No respiratory distress. He has no wheezes.  Neurological: He is alert and oriented to person, place, and time.  Skin: Skin is warm and dry.  Psychiatric: He has a normal mood and affect. His behavior is normal. Judgment and thought content normal.  Nursing note and vitals reviewed.    MD Evaluation Airway: WNL Heart: WNL Abdomen: WNL Chest/ Lungs: WNL ASA  Classification: 3 Mallampati/Airway Score: Two   Imaging: Ct Angio Head W Or Wo Contrast  Result Date: 12/11/2017 CLINICAL DATA:  Initial evaluation for  intermittent left-sided weakness for several weeks. EXAM: CT ANGIOGRAPHY HEAD AND NECK TECHNIQUE: Multidetector CT imaging of the head and neck was performed using the standard protocol during bolus administration of intravenous contrast. Multiplanar CT image reconstructions and MIPs were obtained to evaluate the vascular anatomy. Carotid stenosis measurements (when applicable) are obtained utilizing NASCET criteria, using the distal internal carotid diameter as the denominator. CONTRAST:  19mL ISOVUE-370 IOPAMIDOL (ISOVUE-370) INJECTION 76% COMPARISON:  Prior CT from 12/10/2017 FINDINGS: CTA NECK FINDINGS Aortic arch: Visualized aortic arch of normal caliber with normal 3 vessel morphology. Moderate atheromatous plaque within the visualize arch and about the origin of the great vessels without hemodynamically significant stenosis. Visualized subclavian arteries widely patent. Right carotid system: Atheromatous irregularity within the right common carotid artery without hemodynamically significant stenosis. No significant atheromatous narrowing about the right  carotid bifurcation. Right ICA widely patent distally to the skull base without stenosis, dissection, or occlusion. Left carotid system: Left common carotid artery patent from its origin to the bifurcation without hemodynamically significant stenosis. No significant atheromatous narrowing about the left bifurcation. Left ICA patent from the bifurcation to the skull base without stenosis, dissection, or occlusion. Vertebral arteries: Both of the vertebral arteries arise from the subclavian arteries. Left vertebral artery dominant. Atheromatous stenoses with up to approximate 50% narrowing at the origin of the left vertebral artery. Moderate to severe tandem stenoses noted within the pre foraminal right V1 segment (series 8, images 259, 245). Vertebral arteries otherwise patent within the neck without high-grade stenosis or occlusion. Skeleton: No acute osseus abnormality. No discrete lytic or blastic osseous lesions. Mild multilevel cervical spondylolysis and facet arthrosis noted within the cervical spine. Other neck: No acute soft tissue abnormality within the neck. No adenopathy. Salivary glands normal. Thyroid appears to be absent. Upper chest: Visualized upper chest demonstrates no acute finding. Partially visualized lungs are clear. Review of the MIP images confirms the above findings CTA HEAD FINDINGS Anterior circulation: Petrous segments widely patent bilaterally. Mild atheromatous plaque within the cavernous/para clinoid ICAs without hemodynamically significant stenosis. ICA termini widely patent. A1 segments patent bilaterally. Right A1 slightly hypoplastic. Anterior communicating artery patent and normal. Anterior cerebral arteries widely patent to their distal aspects without stenosis. M1 segments mildly irregular but widely patent bilaterally without stenosis. Normal MCA bifurcations. No proximal M2 occlusion distal MCA branches well opacified and symmetric Posterior circulation: Left vertebral artery  dominant. There are severe tandem near occlusive stenoses involving the left V4 segment. The hypoplastic right vertebral artery is markedly irregular and attenuated as it courses into the cranial vault, and occludes just beyond the takeoff of the right posterior inferior cerebral artery. Posterior inferior cerebral arteries themselves are patent bilaterally. Basilar artery is diffusely narrowed and somewhat attenuated with multifocal atheromatous irregularity, but is patent to its distal aspect. Superior cerebral arteries patent bilaterally. Right PCA supplied via the basilar as well as a small right posterior communicating artery. Predominant fetal type origin of the left PCA, although a small hypoplastic left P1 segment is visualized. PCAs patent to their distal aspects, although demonstrate distal small vessel atheromatous irregularity. Venous sinuses: Patent. Anatomic variants: Predominant fetal type origin of the left PCA. Delayed phase: No abnormal enhancement. Review of the MIP images confirms the above findings IMPRESSION: 1. Negative CTA for large vessel occlusion. 2. 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 Hypoplastic right V4 segment occludes prior to the vertebrobasilar junction. Basilar artery diffusely narrowed with multifocal atheromatous irregularity. PCAs  are well perfused, with predominant fetal type origin of the left PCA, with a fairly robust right posterior communicating artery as well. 3. Additional moderate to advanced stenoses involving the proximal V1 segments as above. 4. Additional mild for age atherosclerotic change about the carotid bifurcations and carotid siphons without hemodynamically significant stenosis. Electronically Signed   By: Jeannine Boga M.D.   On: 12/11/2017 03:18   Ct Head Wo Contrast  Result Date: 12/10/2017 CLINICAL DATA:  Dizziness.  Left arm and leg numbness. EXAM: CT HEAD  WITHOUT CONTRAST TECHNIQUE: Contiguous axial images were obtained from the base of the skull through the vertex without intravenous contrast. COMPARISON:  11/27/2017 FINDINGS: Brain: There is atrophy and chronic small vessel disease changes. No acute intracranial abnormality. Specifically, no hemorrhage, hydrocephalus, mass lesion, acute infarction, or significant intracranial injury. Vascular: No hyperdense vessel or unexpected calcification. Skull: No acute calvarial abnormality. Sinuses/Orbits: No acute findings Other: None IMPRESSION: No acute intracranial abnormality. Atrophy, chronic microvascular disease. Electronically Signed   By: Rolm Baptise M.D.   On: 12/10/2017 20:42   Ct Angio Neck W Or Wo Contrast  Result Date: 12/11/2017 CLINICAL DATA:  Initial evaluation for intermittent left-sided weakness for several weeks. EXAM: CT ANGIOGRAPHY HEAD AND NECK TECHNIQUE: Multidetector CT imaging of the head and neck was performed using the standard protocol during bolus administration of intravenous contrast. Multiplanar CT image reconstructions and MIPs were obtained to evaluate the vascular anatomy. Carotid stenosis measurements (when applicable) are obtained utilizing NASCET criteria, using the distal internal carotid diameter as the denominator. CONTRAST:  82mL ISOVUE-370 IOPAMIDOL (ISOVUE-370) INJECTION 76% COMPARISON:  Prior CT from 12/10/2017 FINDINGS: CTA NECK FINDINGS Aortic arch: Visualized aortic arch of normal caliber with normal 3 vessel morphology. Moderate atheromatous plaque within the visualize arch and about the origin of the great vessels without hemodynamically significant stenosis. Visualized subclavian arteries widely patent. Right carotid system: Atheromatous irregularity within the right common carotid artery without hemodynamically significant stenosis. No significant atheromatous narrowing about the right carotid bifurcation. Right ICA widely patent distally to the skull base without  stenosis, dissection, or occlusion. Left carotid system: Left common carotid artery patent from its origin to the bifurcation without hemodynamically significant stenosis. No significant atheromatous narrowing about the left bifurcation. Left ICA patent from the bifurcation to the skull base without stenosis, dissection, or occlusion. Vertebral arteries: Both of the vertebral arteries arise from the subclavian arteries. Left vertebral artery dominant. Atheromatous stenoses with up to approximate 50% narrowing at the origin of the left vertebral artery. Moderate to severe tandem stenoses noted within the pre foraminal right V1 segment (series 8, images 259, 245). Vertebral arteries otherwise patent within the neck without high-grade stenosis or occlusion. Skeleton: No acute osseus abnormality. No discrete lytic or blastic osseous lesions. Mild multilevel cervical spondylolysis and facet arthrosis noted within the cervical spine. Other neck: No acute soft tissue abnormality within the neck. No adenopathy. Salivary glands normal. Thyroid appears to be absent. Upper chest: Visualized upper chest demonstrates no acute finding. Partially visualized lungs are clear. Review of the MIP images confirms the above findings CTA HEAD FINDINGS Anterior circulation: Petrous segments widely patent bilaterally. Mild atheromatous plaque within the cavernous/para clinoid ICAs without hemodynamically significant stenosis. ICA termini widely patent. A1 segments patent bilaterally. Right A1 slightly hypoplastic. Anterior communicating artery patent and normal. Anterior cerebral arteries widely patent to their distal aspects without stenosis. M1 segments mildly irregular but widely patent bilaterally without stenosis. Normal MCA bifurcations. No proximal M2 occlusion distal MCA  branches well opacified and symmetric Posterior circulation: Left vertebral artery dominant. There are severe tandem near occlusive stenoses involving the left V4  segment. The hypoplastic right vertebral artery is markedly irregular and attenuated as it courses into the cranial vault, and occludes just beyond the takeoff of the right posterior inferior cerebral artery. Posterior inferior cerebral arteries themselves are patent bilaterally. Basilar artery is diffusely narrowed and somewhat attenuated with multifocal atheromatous irregularity, but is patent to its distal aspect. Superior cerebral arteries patent bilaterally. Right PCA supplied via the basilar as well as a small right posterior communicating artery. Predominant fetal type origin of the left PCA, although a small hypoplastic left P1 segment is visualized. PCAs patent to their distal aspects, although demonstrate distal small vessel atheromatous irregularity. Venous sinuses: Patent. Anatomic variants: Predominant fetal type origin of the left PCA. Delayed phase: No abnormal enhancement. Review of the MIP images confirms the above findings IMPRESSION: 1. Negative CTA for large vessel occlusion. 2. 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 Hypoplastic right V4 segment occludes prior to the vertebrobasilar junction. Basilar artery diffusely narrowed with multifocal atheromatous irregularity. PCAs are well perfused, with predominant fetal type origin of the left PCA, with a fairly robust right posterior communicating artery as well. 3. Additional moderate to advanced stenoses involving the proximal V1 segments as above. 4. Additional mild for age atherosclerotic change about the carotid bifurcations and carotid siphons without hemodynamically significant stenosis. Electronically Signed   By: Jeannine Boga M.D.   On: 12/11/2017 03:18   Mr Brain Wo Contrast  Result Date: 12/11/2017 CLINICAL DATA:  84 year old male with waxing and waning left side numbness and weakness. CTA head and neck earlier today positive for severe stenosis,  near occlusion of the dominant left vertebral artery. EXAM: MRI HEAD WITHOUT CONTRAST TECHNIQUE: Multiplanar, multiecho pulse sequences of the brain and surrounding structures were obtained without intravenous contrast. COMPARISON:  CTA head and neck 0119 hours today. Brain MRI 11/18/2012. FINDINGS: Brain: Patchy and confluent restricted diffusion in both cerebellar hemispheres, more extensive on the right (series 3, image 6). The PICA territories are primarily affected. There is borderline involvement of the right SCA territory. There is also a punctate focus of restricted diffusion in the right dorsal pons (series 3, image 12). Mild associated T2 and FLAIR hyperintensity in the in the acutely affected areas with no hemorrhage or mass effect. No other No restricted diffusion or evidence of acute infarction. No acute or chronic cerebral blood products identified. Moderate chronic scattered bilateral cerebral white matter T2 and FLAIR hyperintensity has mildly progressed since 2014. No midline shift, mass effect, evidence of mass lesion, ventriculomegaly, extra-axial collection or acute intracranial hemorrhage. Cervicomedullary junction and pituitary are within normal limits. Vascular: Major intracranial vascular flow voids are stable since 2014. Skull and upper cervical spine: Degenerative ligamentous hypertrophy about the odontoid. Stable visible cervical spine and osseous structures. Sinuses/Orbits: Stable and negative. Other: Mastoids are clear. Visible internal auditory structures appear normal. Scalp and face soft tissues are negative. IMPRESSION: 1. 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 No associated hemorrhage or mass effect. 2. Chronic supratentorial signal changes suggestive of small vessel disease have mildly progressed since 2014. Electronically Signed   By: Genevie Ann M.D.   On: 12/11/2017 13:14   Ir Angio Intra Extracran Sel Com Carotid Innominate Bilat  Mod Sed  Result Date: 12/17/2017 CLINICAL DATA:  Posterior fossa ischemic strokes secondary to  vertebrobasilar severe stenosis. EXAM: IR ANGIO VERTEBRAL SEL VERTEBRAL BILAT MOD SED; IR ANGIO EXTRACRAN SELECT COM CAROTID INNOMINATE BILAT MOD SED COMPARISON:  MRI of the brain of 12/11/2017, and CT angiogram of the head and neck of 12/11/2017. MEDICATIONS: Heparin 1000 units IV; no antibiotic was administered within 1 hour of the procedure. ANESTHESIA/SEDATION: Versed 1 mg IV; Fentanyl 25 mcg IV Moderate Sedation Time:  27 minutes The patient was continuously monitored during the procedure by the interventional radiology nurse under my direct supervision. CONTRAST:  Isovue 300 approximately 55 mL. FLUOROSCOPY TIME:  Fluoroscopy Time: 10 minutes 36 seconds (1079 mGy). COMPLICATIONS: Non none immediate.  E immediate. TECHNIQUE: Informed written consent was obtained from the patient after a thorough discussion of the procedural risks, benefits and alternatives. All questions were addressed. Maximal Sterile Barrier Technique was utilized including caps, mask, sterile gowns, sterile gloves, sterile drape, hand hygiene and skin antiseptic. A timeout was performed prior to the initiation of the procedure. The right groin was prepped and draped in the usual sterile fashion. Thereafter using modified Seldinger technique, transfemoral access into the right common femoral artery was obtained without difficulty. Over a 0.035 inch guidewire, a 5 French Pinnacle sheath was inserted. Through this, and also over 0.035 inch guidewire, a 5 [place] JB 1 catheter was advanced to the aortic arch region and selectively positioned in the right common carotid artery, the right vertebral artery, the left common carotid artery and the left vertebral artery. FINDINGS: The right common carotid arteriogram demonstrates the right external carotid artery and its major branches to be widely patent. The right internal carotid artery at the bulb to the  cranial skull base is widely patent. The petrous, cavernous and supraclinoid segments are widely patent. The right middle cerebral artery and the right anterior cerebral artery opacify into the capillary and venous phases. Transient opacification via the right posterior communicating artery of the right posterior cerebral artery distribution is seen. The non dominant right vertebral artery origin is widely patent. The vessel has approximately 50% stenosis in its proximal 1/3. More distally, the vessel is seen to opacify to the cranial skull base where it terminates in the ipsilateral right posterior-inferior cerebellar artery. The left common carotid arteriogram demonstrates the left external carotid artery and its major branches to be widely patent. The left internal carotid artery at the bulb to the cranial skull base opacifies widely. The petrous, cavernous and supraclinoid segments are widely patent. A left posterior communicating artery is seen transiently opacifying the left posterior cerebral artery distribution. The left middle cerebral artery and the left anterior cerebral artery opacify into the capillary and venous phases. The dominant left vertebral artery proximally demonstrates an approximately 30% stenosis. More distally, the vessel is seen to opacify to the cranial skull base. At the level of the distal left vertebrobasilar junction there is a segmental pre occlusive stenosis of the dominant left vertebrobasilar junction with mild poststenotic dilatation. Distal to this there is a 70% stenosis of the proximal basilar artery. Contrast is seen to extend to the distal basilar artery to the level of the anterior-inferior cerebellar arteries. 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 Approximately 30% stenosis of the dominant left vertebral artery proximally. Approximately 50% stenosis of the non dominant  right vertebral artery proximally. Right non dominant vertebral artery terminates in the right posterior-inferior cerebellar artery. PLAN: Findings discussed with the patient and his daughter. Electronically Signed  By: Luanne Bras M.D.   On: 12/13/2017 15:18   Ir Angio Vertebral Sel Vertebral Bilat Mod Sed  Result Date: 12/17/2017 CLINICAL DATA:  Posterior fossa ischemic strokes secondary to vertebrobasilar severe stenosis. EXAM: IR ANGIO VERTEBRAL SEL VERTEBRAL BILAT MOD SED; IR ANGIO EXTRACRAN SELECT COM CAROTID INNOMINATE BILAT MOD SED COMPARISON:  MRI of the brain of 12/11/2017, and CT angiogram of the head and neck of 12/11/2017. MEDICATIONS: Heparin 1000 units IV; no antibiotic was administered within 1 hour of the procedure. ANESTHESIA/SEDATION: Versed 1 mg IV; Fentanyl 25 mcg IV Moderate Sedation Time:  27 minutes The patient was continuously monitored during the procedure by the interventional radiology nurse under my direct supervision. CONTRAST:  Isovue 300 approximately 55 mL. FLUOROSCOPY TIME:  Fluoroscopy Time: 10 minutes 36 seconds (1079 mGy). COMPLICATIONS: Non none immediate.  E immediate. TECHNIQUE: Informed written consent was obtained from the patient after a thorough discussion of the procedural risks, benefits and alternatives. All questions were addressed. Maximal Sterile Barrier Technique was utilized including caps, mask, sterile gowns, sterile gloves, sterile drape, hand hygiene and skin antiseptic. A timeout was performed prior to the initiation of the procedure. The right groin was prepped and draped in the usual sterile fashion. Thereafter using modified Seldinger technique, transfemoral access into the right common femoral artery was obtained without difficulty. Over a 0.035 inch guidewire, a 5 French Pinnacle sheath was inserted. Through this, and also over 0.035 inch guidewire, a 5 [place] JB 1 catheter was advanced to the aortic arch region and selectively positioned in  the right common carotid artery, the right vertebral artery, the left common carotid artery and the left vertebral artery. FINDINGS: The right common carotid arteriogram demonstrates the right external carotid artery and its major branches to be widely patent. The right internal carotid artery at the bulb to the cranial skull base is widely patent. The petrous, cavernous and supraclinoid segments are widely patent. The right middle cerebral artery and the right anterior cerebral artery opacify into the capillary and venous phases. Transient opacification via the right posterior communicating artery of the right posterior cerebral artery distribution is seen. The non dominant right vertebral artery origin is widely patent. The vessel has approximately 50% stenosis in its proximal 1/3. More distally, the vessel is seen to opacify to the cranial skull base where it terminates in the ipsilateral right posterior-inferior cerebellar artery. The left common carotid arteriogram demonstrates the left external carotid artery and its major branches to be widely patent. The left internal carotid artery at the bulb to the cranial skull base opacifies widely. The petrous, cavernous and supraclinoid segments are widely patent. A left posterior communicating artery is seen transiently opacifying the left posterior cerebral artery distribution. The left middle cerebral artery and the left anterior cerebral artery opacify into the capillary and venous phases. The dominant left vertebral artery proximally demonstrates an approximately 30% stenosis. More distally, the vessel is seen to opacify to the cranial skull base. At the level of the distal left vertebrobasilar junction there is a segmental pre occlusive stenosis of the dominant left vertebrobasilar junction with mild poststenotic dilatation. Distal to this there is a 70% stenosis of the proximal basilar artery. Contrast is seen to extend to the distal basilar artery to the level  of the anterior-inferior cerebellar arteries. 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 Approximately 30% stenosis of the dominant left vertebral artery proximally. Approximately 50%  stenosis of the non dominant right vertebral artery proximally. Right non dominant vertebral artery terminates in the right posterior-inferior cerebellar artery. PLAN: Findings discussed with the patient and his daughter. Electronically Signed   By: Luanne Bras M.D.   On: 12/13/2017 15:18    Labs:  CBC: Recent Labs    12/10/17 2012 12/10/17 2024 12/13/17 0437 12/18/17 0901  WBC 7.7  --  9.7 8.8  HGB 13.2 12.9* 13.0 13.3  HCT 41.4 38.0* 39.8 41.5  PLT 215  --  208 215    COAGS: Recent Labs    12/10/17 2012 12/13/17 0437 12/18/17 0901  INR 1.01 1.02 1.03  APTT 32  --  33    BMP: Recent Labs    12/10/17 2012 12/10/17 2024 12/13/17 0437 12/18/17 0901  NA 138 139 141 142  K 3.8 3.6 4.0 4.1  CL 99 99 106 106  CO2 27  --  26 23  GLUCOSE 106* 105* 109* 94  BUN 19 21 17 16   CALCIUM 9.7  --  9.4 9.1  CREATININE 1.11 1.10 1.05 1.06  GFRNONAA 58*  --  >60 >60  GFRAA >60  --  >60 >60    LIVER FUNCTION TESTS: Recent Labs    12/10/17 2012  BILITOT 0.5  AST 26  ALT 18  ALKPHOS 67  PROT 7.2  ALBUMIN 3.8    TUMOR MARKERS: No results for input(s): AFPTM, CEA, CA199, CHROMGRNA in the last 8760 hours.  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 Plan for image-guided cerebral angiogram with possible left vertebrobasilar junction and proximal basilar artery stenosis angioplasty/stent placement. Patient is NPO. Denies fever and WBCs WNL. INR 1.03 seconds this AM. P2Y12 178 PRU this AM- per Dr. Estanislado Pandy, discontinue Plavix use and load with Brilinta 180 mg prior to procedure- spoke with Manuela Schwartz, RN to inform of this.  Patient requests that I  speak with his daughter in waiting room to discuss procedure/risks/benefits as well- I spoke with daughter and answered all questions/concerns.  Risks and benefits of cerebral angiogram with intervention were discussed with the patient including, but not limited to bleeding, infection, vascular injury, contrast induced renal failure, stroke or even death. This interventional procedure involves the use of X-rays and because of the nature of the planned procedure, it is possible that we will have prolonged use of X-ray fluoroscopy. Potential radiation risks to you include (but are not limited to) the following: - A slightly elevated risk for cancer  several years later in life. This risk is typically less than 0.5% percent. This risk is low in comparison to the normal incidence of human cancer, which is 33% for women and 50% for men according to the [place]. - Radiation induced injury can include skin redness, resembling a rash, tissue breakdown / ulcers and hair loss (which can be temporary or permanent).  The likelihood of either of these occurring depends on the difficulty of the procedure and whether you are sensitive to radiation due to previous procedures, disease, or genetic conditions.  IF your procedure requires a prolonged use of radiation, you will be notified and given written instructions for further action.  It is your responsibility to monitor the irradiated area for the 2 weeks following the procedure and to notify your physician if you are concerned that you have suffered a radiation induced injury.   All of the patient's questions were answered, patient is agreeable to proceed. Consent signed and in chart.  Thank you for this interesting consult.  I greatly enjoyed meeting Vishruth Seoane and look forward to participating in their care.  A copy of this report was sent to the requesting provider on this date.  Electronically Signed: Earley Abide, PA-C 12/18/2017, 9:55  AM   I spent a total of 25 Minutes in face to face in clinical consultation, greater than 50% of which was counseling/coordinating care for left vertebrobasilar junction and proximal basilar artery stenosis.

## 2017-12-18 NOTE — Progress Notes (Signed)
When patient was dropped off to room, we noticed that he had some significant bleeding around the foley catheter. Urine is clear. MD made aware and would like the Heparin gtt continued. Will continue to monitor catheter site. Thayer Ohm D

## 2017-12-19 ENCOUNTER — Encounter (HOSPITAL_COMMUNITY): Payer: Self-pay | Admitting: Interventional Radiology

## 2017-12-19 LAB — CBC WITH DIFFERENTIAL/PLATELET
Abs Immature Granulocytes: 0.1 10*3/uL (ref 0.0–0.1)
Basophils Absolute: 0 10*3/uL (ref 0.0–0.1)
Basophils Relative: 0 %
Eosinophils Absolute: 0 10*3/uL (ref 0.0–0.7)
Eosinophils Relative: 0 %
HCT: 33.6 % — ABNORMAL LOW (ref 39.0–52.0)
Hemoglobin: 10.8 g/dL — ABNORMAL LOW (ref 13.0–17.0)
Immature Granulocytes: 1 %
Lymphocytes Relative: 8 %
Lymphs Abs: 1.2 10*3/uL (ref 0.7–4.0)
MCH: 30.3 pg (ref 26.0–34.0)
MCHC: 32.1 g/dL (ref 30.0–36.0)
MCV: 94.1 fL (ref 78.0–100.0)
Monocytes Absolute: 0.9 10*3/uL (ref 0.1–1.0)
Monocytes Relative: 6 %
Neutro Abs: 13.2 10*3/uL — ABNORMAL HIGH (ref 1.7–7.7)
Neutrophils Relative %: 85 %
Platelets: 202 10*3/uL (ref 150–400)
RBC: 3.57 MIL/uL — ABNORMAL LOW (ref 4.22–5.81)
RDW: 14.5 % (ref 11.5–15.5)
WBC: 15.3 10*3/uL — ABNORMAL HIGH (ref 4.0–10.5)

## 2017-12-19 LAB — BASIC METABOLIC PANEL
Anion gap: 8 (ref 5–15)
BUN: 18 mg/dL (ref 8–23)
CO2: 22 mmol/L (ref 22–32)
Calcium: 8.2 mg/dL — ABNORMAL LOW (ref 8.9–10.3)
Chloride: 109 mmol/L (ref 98–111)
Creatinine, Ser: 1.06 mg/dL (ref 0.61–1.24)
GFR calc Af Amer: >60 mL/min (ref >60)
GFR calc non Af Amer: >60 mL/min (ref >60)
Glucose, Bld: 115 mg/dL — ABNORMAL HIGH (ref 70–99)
Potassium: 3.9 mmol/L (ref 3.5–5.1)
Sodium: 139 mmol/L (ref 135–145)

## 2017-12-19 LAB — PLATELET INHIBITION P2Y12: Platelet Function  P2Y12: 98 [PRU] — ABNORMAL LOW (ref 194–418)

## 2017-12-19 LAB — HEPARIN LEVEL (UNFRACTIONATED): Heparin Unfractionated: 0.29 IU/mL — ABNORMAL LOW (ref 0.30–0.70)

## 2017-12-19 MED ORDER — HEPARIN (PORCINE) IN NACL 100-0.45 UNIT/ML-% IJ SOLN
350.0000 [IU]/h | INTRAMUSCULAR | Status: DC
  Filled 2017-12-19: qty 250

## 2017-12-19 MED ORDER — HYDROCHLOROTHIAZIDE 12.5 MG PO CAPS
12.5000 mg | ORAL_CAPSULE | Freq: Every day | ORAL | Status: DC
  Administered 2017-12-19: 12.5 mg via ORAL
  Filled 2017-12-19: qty 1

## 2017-12-19 MED ORDER — FLUTICASONE PROPIONATE 50 MCG/ACT NA SUSP: 2.0000 | Freq: Every day | NASAL | Status: DC | PRN

## 2017-12-19 MED ORDER — CLEVIDIPINE BUTYRATE 0.5 MG/ML IV EMUL
0.0000 mg/h | INTRAVENOUS | Status: DC
Start: 2017-12-19 — End: 2017-12-19
  Filled 2017-12-19: qty 50

## 2017-12-19 MED ORDER — TICAGRELOR 90 MG PO TABS: 90.0000 mg | ORAL_TABLET | Freq: Two times a day (BID) | ORAL | 3 refills | Status: DC

## 2017-12-19 MED ORDER — LEVOTHYROXINE SODIUM 88 MCG PO TABS
88.0000 ug | ORAL_TABLET | Freq: Every day | ORAL | Status: DC
  Administered 2017-12-19: 88 ug via ORAL
  Filled 2017-12-19: qty 1

## 2017-12-19 MED ORDER — GEMFIBROZIL 600 MG PO TABS
300.0000 mg | ORAL_TABLET | Freq: Two times a day (BID) | ORAL | Status: DC
  Administered 2017-12-19: 300 mg via ORAL
  Filled 2017-12-19 (×2): qty 0.5

## 2017-12-19 MED ORDER — LOSARTAN POTASSIUM 50 MG PO TABS
50.0000 mg | ORAL_TABLET | Freq: Every day | ORAL | Status: DC
  Administered 2017-12-19: 50 mg via ORAL
  Filled 2017-12-19: qty 1

## 2017-12-19 MED ORDER — LOSARTAN POTASSIUM-HCTZ 50-12.5 MG PO TABS: 1.0000 | ORAL_TABLET | Freq: Every day | ORAL | Status: DC

## 2017-12-19 MED ORDER — PREDNISONE 5 MG PO TABS
5.0000 mg | ORAL_TABLET | Freq: Every day | ORAL | Status: DC
  Administered 2017-12-19: 5 mg via ORAL
  Filled 2017-12-19: qty 1

## 2017-12-19 NOTE — Progress Notes (Signed)
ANTICOAGULATION CONSULT NOTE - Follow Up Consult  Pharmacy Consult for Heparin  Indication: S/P Neuro IR procedure  Allergies  Allergen Reactions  . Asa [Aspirin] Other (See Comments)    Gastric symptoms can tolerate the baby ASA but no more than that. Pt. Is taking ASA 81mg  now.  . Penicillin G Rash   Patient Measurements: Height: 5\' 2"  (157.5 cm) Weight: 122 lb (55.3 kg) IBW/kg (Calculated) : 54.6  Vital Signs: Temp: 99 F (37.2 C) (07/31 0000) Temp Source: Oral (07/31 0000) BP: 123/103 (07/31 0021) Pulse Rate: 69 (07/31 0021)  Labs: Recent Labs    12/18/17 0901 12/19/17 0001  HGB 13.3  --   HCT 41.5  --   PLT 215  --   APTT 33  --   LABPROT 13.4  --   INR 1.03  --   HEPARINUNFRC  --  0.29*  CREATININE 1.06  --     Estimated Creatinine Clearance: 37.9 mL/min (by C-G formula based on SCr of 1.06 mg/dL).   Assessment: 84 y/o M s/p cerebral angiogram with angioplasty, on heparin overnight, heparin level is slightly above goal (0.1-0.25).   Goal of Therapy:  Heparin level 0.1-0.25 units/ml Monitor platelets by anticoagulation protocol: Yes   Plan:  Dec heparin to 350 units/hr Heparin off at 0800 this AM  Narda Bonds 12/19/2017,12:46 AM

## 2017-12-19 NOTE — Progress Notes (Signed)
Pt and pt's daughter given discharge instructions with understanding. Letter given to patient as requested for dates hospitalized. Left groin level 0 and band aids applied.

## 2017-12-19 NOTE — Progress Notes (Signed)
Dr. Estanislado Pandy contacted about pts BP higher than 120-140 range. A-line waveform without whip after heparin labs drawn.  Will use A-line pressure.  Previously ordered cardene was d/c'ed due to Producer, television/film/video.  Cleviprex ordered if needed.  Updated family.  Will monitor pt closely.

## 2017-12-19 NOTE — Discharge Instructions (Signed)
Cerebral Angiogram, Care After °Refer to this sheet in the next few weeks. These instructions provide you with information on caring for yourself after your procedure. Your health care provider may also give you more specific instructions. Your treatment has been planned according to current medical practices, but problems sometimes occur. Call your health care provider if you have any problems or questions after your procedure. °What can I expect after the procedure? °After your procedure, it is typical to have the following: °· Bruising at the catheter insertion site that usually fades within 1-2 weeks. °· Blood collecting in the tissue (hematoma) that may be painful to the touch. It should usually decrease in size and tenderness within 1-2 weeks. °· A mild headache. ° °Follow these instructions at home: °· Take medicines only as directed by your health care provider. °· You may shower 24-48 hours after the procedure or as directed by your health care provider. Remove the bandage (dressing) and gently wash the site with plain soap and water. Pat the area dry with a clean towel. Do not rub the site, because this may cause bleeding. °· Do not take baths, swim, or use a hot tub until your health care provider approves. °· Check your insertion site every day for redness, swelling, or drainage. °· Do not apply powder or lotion to the site. °· Do not lift over 10 lb (4.5 kg) for 5 days after your procedure or as directed by your health care provider. °· Ask your health care provider when it is okay to: °? Return to work or school. °? Resume usual physical activities or sports. °? Resume sexual activity. °· Do not drive home if you are discharged the same day as the procedure. Have someone else drive you. °· You may drive 24 hours after the procedure unless otherwise instructed by your health care provider. °· Do not operate machinery or power tools for 24 hours after the procedure or as directed by your health care  provider. °· If your procedure was done as an outpatient procedure, which means that you went home the same day as your procedure, a responsible adult should be with you for the first 24 hours after you arrive home. °· Keep all follow-up visits as directed by your health care provider. This is important. °Contact a health care provider if: °· You have a fever. °· You have chills. °· You have increased bleeding from the catheter insertion site. Hold pressure on the site. °Get help right away if: °· You have vision changes or loss of vision. °· You have numbness or weakness on one side of your body. °· You have difficulty talking, or you have slurred speech or cannot speak (aphasia). °· You feel confused or have difficulty remembering. °· You have unusual pain at the catheter insertion site. °· You have redness, warmth, or swelling at the catheter insertion site. °· You have drainage (other than a small amount of blood on the dressing) from the catheter insertion site. °· The catheter insertion site is bleeding, and the bleeding does not stop after 30 minutes of holding steady pressure on the site. °These symptoms may represent a serious problem that is an emergency. Do not wait to see if the symptoms will go away. Get medical help right away. Call your local emergency services (911 in [place] Do not drive yourself to the hospital. °This information is not intended to replace advice given to you by your health care provider. Make sure you discuss any questions   you have with your health care provider. °Document Released: 09/22/2013 Document Revised: 10/14/2015 Document Reviewed: 05/21/2013 °Elsevier Interactive Patient Education © 2017 [employer] ° °

## 2017-12-19 NOTE — Evaluation (Addendum)
Physical Therapy Evaluation/Discharge Patient Details Name: [name] MRN: [id number] DOB: 1928-12-03 Today's Date: 12/19/2017   History of Present Illness  84 y.o. male admitted on 12/18/17 for  cerebral angiogram and distal vertebrobasilar junction/proximal basilar artery angioplasty. due to recent admission on 12/10/17 for left sided numbness and weakness suggestive of vascular stenosis.  Pt with significant PMH of stroke, RA, insomnia, HTN, head injury, adenocarinoma s/p hemicolectomy and chemo (1985).    Clinical Impression  Pt is mildly unsteady on his feet, however, his gait stability improved the further we walked indicating to me that he needs to get up and moving.  Cues for correct use of quad cane.  Pt will have family supervision at discharge and I was unable to find any left sided weakness when MMT in the bed or signs of functional strength deficits.    Follow Up Recommendations No PT follow up;Supervision for mobility/OOB    Equipment Recommendations  None recommended by PT    Recommendations for Other Services   NA    Precautions / Restrictions Precautions Precautions: Fall Precaution Comments: mildly unsteady on his feet today      Mobility  Bed Mobility Overal bed mobility: Needs Assistance Bed Mobility: Supine to Sit     Supine to sit: Min guard     General bed mobility comments: Min guard assist  Transfers Overall transfer level: Needs assistance Equipment used: Quad cane Transfers: Sit to/from Stand Sit to Stand: Min guard         General transfer comment: min guard assist for safety during transitions.   Ambulation/Gait Ambulation/Gait assistance: Min guard Gait Distance (Feet): 200 Feet Assistive device: Quad cane Gait Pattern/deviations: Step-through pattern;Shuffle     General Gait Details: Pt started with slow, cautious gait pattern, he needed cues to make contact with the cane on the floor as he was just carrying it with him.  therapist  started with min hand held assist, but the further we went the more confident and steady pt got on his feet, indicating to me that he just needs to get up and move.  No significant LOB requiring external assist and therapist let go of hand ~100 ' into gait to make sure he could steady himself.          Balance Overall balance assessment: Needs assistance Sitting-balance support: Feet supported;No upper extremity supported;Bilateral upper extremity supported Sitting balance-Leahy Scale: Good     Standing balance support: Single extremity supported Standing balance-Leahy Scale: Fair Standing balance comment: pt is able to static stand without an assistive device                             Pertinent Vitals/Pain Pain Assessment: No/denies pain    Home Living Family/patient expects to be discharged to:: Private residence Living Arrangements: Spouse/significant other;Children Available Help at Discharge: Family;Available 24 hours/day Type of Home: House Home Access: Stairs to enter Entrance Stairs-Rails: None Entrance Stairs-Number of Steps: 3 Home Layout: Multi-level;Able to live on main level with bedroom/bathroom Home Equipment: Cane - quad;Shower seat - built in      Prior Function Level of Independence: Independent with assistive device(s)         Comments: Pt reports he only has used a cane for 3 weeks since the L leg and arm weakness was making him fall frequently, but did not use it before then and really doesnt use it in the house, just out in the community.  He furniture walked in the home.  He and his wife are both retired and she does most of the cooking and cleaning he reports.  He wears glasses to see and does not have them with him today.  He does not endorse any vision changes.      Hand Dominance   Dominant Hand: Right    Extremity/Trunk Assessment   Upper Extremity Assessment Upper Extremity Assessment: Overall WFL for tasks assessed     Lower Extremity Assessment Lower Extremity Assessment: Overall WFL for tasks assessed(left leg tested and equal to R, no sensation deficits)    Cervical / Trunk Assessment Cervical / Trunk Assessment: Normal  Communication   Communication: Prefers language other than English(speaking broken, but understandable [place])  Cognition Arousal/Alertness: Awake/alert Behavior During Therapy: WFL for tasks assessed/performed Overall Cognitive Status: Within Functional Limits for tasks assessed                                           Exercises Other Exercises Other Exercises: Discussed walking around his home three times a day for exercise and he was agreeable.  I also discussed with his daughter how he improved just within my session and I believe he needs to get home and be more active than he has been for the past 24 hours here with [place].  I did encourage her that if she or her mom noticed that he was still stumbling or falling more than usual, to contact his doctor and get therapy arranged.     Assessment/Plan    PT Assessment Patent does not need any further PT services  PT Problem List Decreased balance;Decreased mobility;Decreased activity tolerance       PT Treatment Interventions      PT Goals (Current goals can be found in the Care Plan section)  Acute Rehab PT Goals PT Goal Formulation: All assessment and education complete, DC therapy               AM-PAC PT "6 Clicks" Daily Activity  Outcome Measure Difficulty turning over in bed (including adjusting bedclothes, sheets and blankets)?: Unable Difficulty moving from lying on back to sitting on the side of the bed? : Unable Difficulty sitting down on and standing up from a chair with arms (e.g., wheelchair, bedside commode, etc,.)?: Unable Help needed moving to and from a bed to chair (including a wheelchair)?: A Little Help needed walking in hospital room?: A Little Help needed climbing 3-5 steps with  a railing? : A Little 6 Click Score: 12    End of Session   Activity Tolerance: Patient tolerated treatment well Patient left: in chair;with call bell/phone within reach;with family/visitor present Nurse Communication: Mobility status PT Visit Diagnosis: Difficulty in walking, not elsewhere classified (R26.2);Muscle weakness (generalized) (M62.81);Other symptoms and signs involving the nervous system (H21.975)    Time: 8832-5498 PT Time Calculation (min) (ACUTE ONLY): 30 min   Charges:          Wells Guiles B. Dairon Procter, PT, DPT [phone]   PT Evaluation $PT Eval Moderate Complexity: 1 Mod PT Treatments $Gait Training: 8-22 mins        12/19/2017, 12:10 PM

## 2017-12-19 NOTE — Discharge Summary (Signed)
Patient ID: [name] MRN: [id number] DOB/AGE: March 14, 1929 84 y.o.  Admit date: 12/18/2017 Discharge date: 12/19/2017  Supervising Physician: Luanne Bras  Patient Status: [facility] - In-pt  Admission Diagnoses: Basilar artery stenosis with infarction  Discharge Diagnoses:  Active Problems:   Basilar artery stenosis with infarction [facility])   Discharged Condition: stable  Hospital Course:  Patient presented to [facility] 12/18/2017 for an image-guided cerebral angiogram with distal vertebrobasilar junction/proximal basilar artery stenosis angioplasty with Dr. Estanislado Pandy. Procedure occurred without major complications and patient was transferred to recovery in stable condition, VSS, left groin incision stable. In PACU, patient developed a left groin bleed/hematoma. Manual pressure was held and hemostasis was achieved without signs of hematoma following hemostasis. Patient was transferred to neuro ICU for overnight observation. No major events occurred overnight.  Patient awake and alert sitting in chair with no complaints at this time. Denies headache, weakness, dizziness, numbness/tingling, vision changes, hearing changes, or tinnitus. Left groin stable. Plan to discharge home today and follow-up in clinic with Dr. Estanislado Pandy in 2 weeks.   Significant Diagnostic Studies: Ct Angio Head W Or Wo Contrast  Result Date: 12/11/2017 CLINICAL DATA:  Initial evaluation for intermittent left-sided weakness for several weeks. EXAM: CT ANGIOGRAPHY HEAD AND NECK TECHNIQUE: Multidetector CT imaging of the head and neck was performed using the standard protocol during bolus administration of intravenous contrast. Multiplanar CT image reconstructions and MIPs were obtained to evaluate the vascular anatomy. Carotid stenosis measurements (when applicable) are obtained utilizing NASCET criteria, using the distal internal carotid diameter as the denominator. CONTRAST:  14mL ISOVUE-370 IOPAMIDOL (ISOVUE-370) INJECTION  76% COMPARISON:  Prior CT from 12/10/2017 FINDINGS: CTA NECK FINDINGS Aortic arch: Visualized aortic arch of normal caliber with normal 3 vessel morphology. Moderate atheromatous plaque within the visualize arch and about the origin of the great vessels without hemodynamically significant stenosis. Visualized subclavian arteries widely patent. Right carotid system: Atheromatous irregularity within the right common carotid artery without hemodynamically significant stenosis. No significant atheromatous narrowing about the right carotid bifurcation. Right ICA widely patent distally to the skull base without stenosis, dissection, or occlusion. Left carotid system: Left common carotid artery patent from its origin to the bifurcation without hemodynamically significant stenosis. No significant atheromatous narrowing about the left bifurcation. Left ICA patent from the bifurcation to the skull base without stenosis, dissection, or occlusion. Vertebral arteries: Both of the vertebral arteries arise from the subclavian arteries. Left vertebral artery dominant. Atheromatous stenoses with up to approximate 50% narrowing at the origin of the left vertebral artery. Moderate to severe tandem stenoses noted within the pre foraminal right V1 segment (series 8, images 259, 245). Vertebral arteries otherwise patent within the neck without high-grade stenosis or occlusion. Skeleton: No acute osseus abnormality. No discrete lytic or blastic osseous lesions. Mild multilevel cervical spondylolysis and facet arthrosis noted within the cervical spine. Other neck: No acute soft tissue abnormality within the neck. No adenopathy. Salivary glands normal. Thyroid appears to be absent. Upper chest: Visualized upper chest demonstrates no acute finding. Partially visualized lungs are clear. Review of the MIP images confirms the above findings CTA HEAD FINDINGS Anterior circulation: Petrous segments widely patent bilaterally. Mild atheromatous  plaque within the cavernous/para clinoid ICAs without hemodynamically significant stenosis. ICA termini widely patent. A1 segments patent bilaterally. Right A1 slightly hypoplastic. Anterior communicating artery patent and normal. Anterior cerebral arteries widely patent to their distal aspects without stenosis. M1 segments mildly irregular but widely patent bilaterally without stenosis. Normal MCA bifurcations. No proximal M2 occlusion distal  MCA branches well opacified and symmetric Posterior circulation: Left vertebral artery dominant. There are severe tandem near occlusive stenoses involving the left V4 segment. The hypoplastic right vertebral artery is markedly irregular and attenuated as it courses into the cranial vault, and occludes just beyond the takeoff of the right posterior inferior cerebral artery. Posterior inferior cerebral arteries themselves are patent bilaterally. Basilar artery is diffusely narrowed and somewhat attenuated with multifocal atheromatous irregularity, but is patent to its distal aspect. Superior cerebral arteries patent bilaterally. Right PCA supplied via the basilar as well as a small right posterior communicating artery. Predominant fetal type origin of the left PCA, although a small hypoplastic left P1 segment is visualized. PCAs patent to their distal aspects, although demonstrate distal small vessel atheromatous irregularity. Venous sinuses: Patent. Anatomic variants: Predominant fetal type origin of the left PCA. Delayed phase: No abnormal enhancement. Review of the MIP images confirms the above findings IMPRESSION: 1. Negative CTA for large vessel occlusion. 2. 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 Hypoplastic right V4 segment occludes prior to the vertebrobasilar junction. Basilar artery diffusely narrowed with multifocal atheromatous irregularity. PCAs are well perfused, with predominant fetal  type origin of the left PCA, with a fairly robust right posterior communicating artery as well. 3. Additional moderate to advanced stenoses involving the proximal V1 segments as above. 4. Additional mild for age atherosclerotic change about the carotid bifurcations and carotid siphons without hemodynamically significant stenosis. Electronically Signed   By: Jeannine Boga M.D.   On: 12/11/2017 03:18   Ct Head Wo Contrast  Result Date: 12/10/2017 CLINICAL DATA:  Dizziness.  Left arm and leg numbness. EXAM: CT HEAD WITHOUT CONTRAST TECHNIQUE: Contiguous axial images were obtained from the base of the skull through the vertex without intravenous contrast. COMPARISON:  11/27/2017 FINDINGS: Brain: There is atrophy and chronic small vessel disease changes. No acute intracranial abnormality. Specifically, no hemorrhage, hydrocephalus, mass lesion, acute infarction, or significant intracranial injury. Vascular: No hyperdense vessel or unexpected calcification. Skull: No acute calvarial abnormality. Sinuses/Orbits: No acute findings Other: None IMPRESSION: No acute intracranial abnormality. Atrophy, chronic microvascular disease. Electronically Signed   By: Rolm Baptise M.D.   On: 12/10/2017 20:42   Ct Angio Neck W Or Wo Contrast  Result Date: 12/11/2017 CLINICAL DATA:  Initial evaluation for intermittent left-sided weakness for several weeks. EXAM: CT ANGIOGRAPHY HEAD AND NECK TECHNIQUE: Multidetector CT imaging of the head and neck was performed using the standard protocol during bolus administration of intravenous contrast. Multiplanar CT image reconstructions and MIPs were obtained to evaluate the vascular anatomy. Carotid stenosis measurements (when applicable) are obtained utilizing NASCET criteria, using the distal internal carotid diameter as the denominator. CONTRAST:  56mL ISOVUE-370 IOPAMIDOL (ISOVUE-370) INJECTION 76% COMPARISON:  Prior CT from 12/10/2017 FINDINGS: CTA NECK FINDINGS Aortic arch:  Visualized aortic arch of normal caliber with normal 3 vessel morphology. Moderate atheromatous plaque within the visualize arch and about the origin of the great vessels without hemodynamically significant stenosis. Visualized subclavian arteries widely patent. Right carotid system: Atheromatous irregularity within the right common carotid artery without hemodynamically significant stenosis. No significant atheromatous narrowing about the right carotid bifurcation. Right ICA widely patent distally to the skull base without stenosis, dissection, or occlusion. Left carotid system: Left common carotid artery patent from its origin to the bifurcation without hemodynamically significant stenosis. No significant atheromatous narrowing about the left bifurcation. Left ICA patent from the bifurcation to the skull base without stenosis, dissection,  or occlusion. Vertebral arteries: Both of the vertebral arteries arise from the subclavian arteries. Left vertebral artery dominant. Atheromatous stenoses with up to approximate 50% narrowing at the origin of the left vertebral artery. Moderate to severe tandem stenoses noted within the pre foraminal right V1 segment (series 8, images 259, 245). Vertebral arteries otherwise patent within the neck without high-grade stenosis or occlusion. Skeleton: No acute osseus abnormality. No discrete lytic or blastic osseous lesions. Mild multilevel cervical spondylolysis and facet arthrosis noted within the cervical spine. Other neck: No acute soft tissue abnormality within the neck. No adenopathy. Salivary glands normal. Thyroid appears to be absent. Upper chest: Visualized upper chest demonstrates no acute finding. Partially visualized lungs are clear. Review of the MIP images confirms the above findings CTA HEAD FINDINGS Anterior circulation: Petrous segments widely patent bilaterally. Mild atheromatous plaque within the cavernous/para clinoid ICAs without hemodynamically significant  stenosis. ICA termini widely patent. A1 segments patent bilaterally. Right A1 slightly hypoplastic. Anterior communicating artery patent and normal. Anterior cerebral arteries widely patent to their distal aspects without stenosis. M1 segments mildly irregular but widely patent bilaterally without stenosis. Normal MCA bifurcations. No proximal M2 occlusion distal MCA branches well opacified and symmetric Posterior circulation: Left vertebral artery dominant. There are severe tandem near occlusive stenoses involving the left V4 segment. The hypoplastic right vertebral artery is markedly irregular and attenuated as it courses into the cranial vault, and occludes just beyond the takeoff of the right posterior inferior cerebral artery. Posterior inferior cerebral arteries themselves are patent bilaterally. Basilar artery is diffusely narrowed and somewhat attenuated with multifocal atheromatous irregularity, but is patent to its distal aspect. Superior cerebral arteries patent bilaterally. Right PCA supplied via the basilar as well as a small right posterior communicating artery. Predominant fetal type origin of the left PCA, although a small hypoplastic left P1 segment is visualized. PCAs patent to their distal aspects, although demonstrate distal small vessel atheromatous irregularity. Venous sinuses: Patent. Anatomic variants: Predominant fetal type origin of the left PCA. Delayed phase: No abnormal enhancement. Review of the MIP images confirms the above findings IMPRESSION: 1. Negative CTA for large vessel occlusion. 2. 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 Hypoplastic right V4 segment occludes prior to the vertebrobasilar junction. Basilar artery diffusely narrowed with multifocal atheromatous irregularity. PCAs are well perfused, with predominant fetal type origin of the left PCA, with a fairly robust right posterior communicating  artery as well. 3. Additional moderate to advanced stenoses involving the proximal V1 segments as above. 4. Additional mild for age atherosclerotic change about the carotid bifurcations and carotid siphons without hemodynamically significant stenosis. Electronically Signed   By: Jeannine Boga M.D.   On: 12/11/2017 03:18   Mr Brain Wo Contrast  Result Date: 12/11/2017 CLINICAL DATA:  84 year old male with waxing and waning left side numbness and weakness. CTA head and neck earlier today positive for severe stenosis, near occlusion of the dominant left vertebral artery. EXAM: MRI HEAD WITHOUT CONTRAST TECHNIQUE: Multiplanar, multiecho pulse sequences of the brain and surrounding structures were obtained without intravenous contrast. COMPARISON:  CTA head and neck 0119 hours today. Brain MRI 11/18/2012. FINDINGS: Brain: Patchy and confluent restricted diffusion in both cerebellar hemispheres, more extensive on the right (series 3, image 6). The PICA territories are primarily affected. There is borderline involvement of the right SCA territory. There is also a punctate focus of restricted diffusion in the right dorsal pons (series 3, image 12). Mild associated  T2 and FLAIR hyperintensity in the in the acutely affected areas with no hemorrhage or mass effect. No other No restricted diffusion or evidence of acute infarction. No acute or chronic cerebral blood products identified. Moderate chronic scattered bilateral cerebral white matter T2 and FLAIR hyperintensity has mildly progressed since 2014. No midline shift, mass effect, evidence of mass lesion, ventriculomegaly, extra-axial collection or acute intracranial hemorrhage. Cervicomedullary junction and pituitary are within normal limits. Vascular: Major intracranial vascular flow voids are stable since 2014. Skull and upper cervical spine: Degenerative ligamentous hypertrophy about the odontoid. Stable visible cervical spine and osseous structures.  Sinuses/Orbits: Stable and negative. Other: Mastoids are clear. Visible internal auditory structures appear normal. Scalp and face soft tissues are negative. IMPRESSION: 1. 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 No associated hemorrhage or mass effect. 2. Chronic supratentorial signal changes suggestive of small vessel disease have mildly progressed since 2014. Electronically Signed   By: Genevie Ann M.D.   On: 12/11/2017 13:14   Ir Angio Intra Extracran Sel Com Carotid Innominate Bilat Mod Sed  Result Date: 12/17/2017 CLINICAL DATA:  Posterior fossa ischemic strokes secondary to vertebrobasilar severe stenosis. EXAM: IR ANGIO VERTEBRAL SEL VERTEBRAL BILAT MOD SED; IR ANGIO EXTRACRAN SELECT COM CAROTID INNOMINATE BILAT MOD SED COMPARISON:  MRI of the brain of 12/11/2017, and CT angiogram of the head and neck of 12/11/2017. MEDICATIONS: Heparin 1000 units IV; no antibiotic was administered within 1 hour of the procedure. ANESTHESIA/SEDATION: Versed 1 mg IV; Fentanyl 25 mcg IV Moderate Sedation Time:  27 minutes The patient was continuously monitored during the procedure by the interventional radiology nurse under my direct supervision. CONTRAST:  Isovue 300 approximately 55 mL. FLUOROSCOPY TIME:  Fluoroscopy Time: 10 minutes 36 seconds (1079 mGy). COMPLICATIONS: Non none immediate.  E immediate. TECHNIQUE: Informed written consent was obtained from the patient after a thorough discussion of the procedural risks, benefits and alternatives. All questions were addressed. Maximal Sterile Barrier Technique was utilized including caps, mask, sterile gowns, sterile gloves, sterile drape, hand hygiene and skin antiseptic. A timeout was performed prior to the initiation of the procedure. The right groin was prepped and draped in the usual sterile fashion. Thereafter using modified Seldinger technique, transfemoral access into the right common femoral artery was obtained without  difficulty. Over a 0.035 inch guidewire, a 5 French Pinnacle sheath was inserted. Through this, and also over 0.035 inch guidewire, a 5 [place] JB 1 catheter was advanced to the aortic arch region and selectively positioned in the right common carotid artery, the right vertebral artery, the left common carotid artery and the left vertebral artery. FINDINGS: The right common carotid arteriogram demonstrates the right external carotid artery and its major branches to be widely patent. The right internal carotid artery at the bulb to the cranial skull base is widely patent. The petrous, cavernous and supraclinoid segments are widely patent. The right middle cerebral artery and the right anterior cerebral artery opacify into the capillary and venous phases. Transient opacification via the right posterior communicating artery of the right posterior cerebral artery distribution is seen. The non dominant right vertebral artery origin is widely patent. The vessel has approximately 50% stenosis in its proximal 1/3. More distally, the vessel is seen to opacify to the cranial skull base where it terminates in the ipsilateral right posterior-inferior cerebellar artery. The left common carotid arteriogram demonstrates the left external carotid artery and its major branches to be widely patent. The left internal carotid  artery at the bulb to the cranial skull base opacifies widely. The petrous, cavernous and supraclinoid segments are widely patent. A left posterior communicating artery is seen transiently opacifying the left posterior cerebral artery distribution. The left middle cerebral artery and the left anterior cerebral artery opacify into the capillary and venous phases. The dominant left vertebral artery proximally demonstrates an approximately 30% stenosis. More distally, the vessel is seen to opacify to the cranial skull base. At the level of the distal left vertebrobasilar junction there is a segmental pre occlusive  stenosis of the dominant left vertebrobasilar junction with mild poststenotic dilatation. Distal to this there is a 70% stenosis of the proximal basilar artery. Contrast is seen to extend to the distal basilar artery to the level of the anterior-inferior cerebellar arteries. 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 Approximately 30% stenosis of the dominant left vertebral artery proximally. Approximately 50% stenosis of the non dominant right vertebral artery proximally. Right non dominant vertebral artery terminates in the right posterior-inferior cerebellar artery. PLAN: Findings discussed with the patient and his daughter. Electronically Signed   By: Luanne Bras M.D.   On: 12/13/2017 15:18   Ir Angio Vertebral Sel Vertebral Bilat Mod Sed  Result Date: 12/17/2017 CLINICAL DATA:  Posterior fossa ischemic strokes secondary to vertebrobasilar severe stenosis. EXAM: IR ANGIO VERTEBRAL SEL VERTEBRAL BILAT MOD SED; IR ANGIO EXTRACRAN SELECT COM CAROTID INNOMINATE BILAT MOD SED COMPARISON:  MRI of the brain of 12/11/2017, and CT angiogram of the head and neck of 12/11/2017. MEDICATIONS: Heparin 1000 units IV; no antibiotic was administered within 1 hour of the procedure. ANESTHESIA/SEDATION: Versed 1 mg IV; Fentanyl 25 mcg IV Moderate Sedation Time:  27 minutes The patient was continuously monitored during the procedure by the interventional radiology nurse under my direct supervision. CONTRAST:  Isovue 300 approximately 55 mL. FLUOROSCOPY TIME:  Fluoroscopy Time: 10 minutes 36 seconds (1079 mGy). COMPLICATIONS: Non none immediate.  E immediate. TECHNIQUE: Informed written consent was obtained from the patient after a thorough discussion of the procedural risks, benefits and alternatives. All questions were addressed. Maximal Sterile Barrier Technique was utilized including caps, mask, sterile gowns, sterile  gloves, sterile drape, hand hygiene and skin antiseptic. A timeout was performed prior to the initiation of the procedure. The right groin was prepped and draped in the usual sterile fashion. Thereafter using modified Seldinger technique, transfemoral access into the right common femoral artery was obtained without difficulty. Over a 0.035 inch guidewire, a 5 French Pinnacle sheath was inserted. Through this, and also over 0.035 inch guidewire, a 5 [place] JB 1 catheter was advanced to the aortic arch region and selectively positioned in the right common carotid artery, the right vertebral artery, the left common carotid artery and the left vertebral artery. FINDINGS: The right common carotid arteriogram demonstrates the right external carotid artery and its major branches to be widely patent. The right internal carotid artery at the bulb to the cranial skull base is widely patent. The petrous, cavernous and supraclinoid segments are widely patent. The right middle cerebral artery and the right anterior cerebral artery opacify into the capillary and venous phases. Transient opacification via the right posterior communicating artery of the right posterior cerebral artery distribution is seen. The non dominant right vertebral artery origin is widely patent. The vessel has approximately 50% stenosis in its proximal 1/3. More distally, the vessel is seen to opacify to the cranial skull  base where it terminates in the ipsilateral right posterior-inferior cerebellar artery. The left common carotid arteriogram demonstrates the left external carotid artery and its major branches to be widely patent. The left internal carotid artery at the bulb to the cranial skull base opacifies widely. The petrous, cavernous and supraclinoid segments are widely patent. A left posterior communicating artery is seen transiently opacifying the left posterior cerebral artery distribution. The left middle cerebral artery and the left anterior  cerebral artery opacify into the capillary and venous phases. The dominant left vertebral artery proximally demonstrates an approximately 30% stenosis. More distally, the vessel is seen to opacify to the cranial skull base. At the level of the distal left vertebrobasilar junction there is a segmental pre occlusive stenosis of the dominant left vertebrobasilar junction with mild poststenotic dilatation. Distal to this there is a 70% stenosis of the proximal basilar artery. Contrast is seen to extend to the distal basilar artery to the level of the anterior-inferior cerebellar arteries. 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 Approximately 30% stenosis of the dominant left vertebral artery proximally. Approximately 50% stenosis of the non dominant right vertebral artery proximally. Right non dominant vertebral artery terminates in the right posterior-inferior cerebellar artery. PLAN: Findings discussed with the patient and his daughter. Electronically Signed   By: Luanne Bras M.D.   On: 12/13/2017 15:18    Discharge Exam: Blood pressure (!) 126/54, pulse 67, temperature 97.8 F (36.6 C), temperature source Oral, resp. rate (!) 23, height 5\' 2"  (1.575 m), weight 122 lb (55.3 kg), SpO2 95 %. Physical Exam  Constitutional: He appears well-developed and well-nourished. No distress.  Cardiovascular: Normal rate, regular rhythm and normal heart sounds.  No murmur heard. Pulmonary/Chest: Effort normal and breath sounds normal. No respiratory distress. He has no wheezes.  Neurological:  Alert, awake, and oriented x3. Speech and comprehension intact. PERRL bilaterally. EOMs intact bilaterally without nystagmus or subjective diplopia. Visual fields not assessed. No facial asymmetry. Tongue midline. Motor power symmetric proportional to effort. No pronator drift. Fine motor and coordination intact and  symmetric. Gait not assessed. Romberg not assessed. Heel to toe not assessed. Distal pulses 2+ bilaterally.  Skin: Skin is warm and dry.  Left groin incision stable without active bleeding or hematoma.  Psychiatric: He has a normal mood and affect. His behavior is normal. Judgment and thought content normal.  Nursing note and vitals reviewed.   Disposition: Discharge disposition: 01-Home or Self Care       Discharge Instructions    Call MD for:  difficulty breathing, headache or visual disturbances   Complete by:  As directed    Call MD for:  extreme fatigue   Complete by:  As directed    Call MD for:  hives   Complete by:  As directed    Call MD for:  persistant dizziness or light-headedness   Complete by:  As directed    Call MD for:  persistant nausea and vomiting   Complete by:  As directed    Call MD for:  redness, tenderness, or signs of infection (pain, swelling, redness, odor or green/yellow discharge around incision site)   Complete by:  As directed    Call MD for:  severe uncontrolled pain   Complete by:  As directed    Call MD for:  temperature >100.4   Complete by:  As directed    Diet - low sodium heart  healthy   Complete by:  As directed    Discharge instructions   Complete by:  As directed    Continue taking Brilinta 90 mg twice daily. Continue taking Aspirin 81 mg once daily. No bending, stooping, or lifting more than 10 pounds for 2 weeks. No driving self for 2 weeks. Stay hydrated by drinking plenty of water.   Increase activity slowly   Complete by:  As directed      Allergies as of 12/19/2017      Reactions   Asa [aspirin] Other (See Comments)   Gastric symptoms can tolerate the baby ASA but no more than that. Pt. Is taking ASA 81mg  now.   Penicillin G Rash      Medication List    STOP taking these medications   clopidogrel 75 MG tablet Commonly known as:  PLAVIX     TAKE these medications   aspirin 81 MG tablet Take 81 mg by mouth  daily.   fluticasone 50 MCG/ACT nasal spray Commonly known as:  FLONASE Place 2 sprays into the nose daily as needed for allergies.   gemfibrozil 600 MG tablet Commonly known as:  LOPID Take 0.5 tablets (300 mg total) by mouth 2 (two) times daily before a meal.   levothyroxine 88 MCG tablet Commonly known as:  SYNTHROID, LEVOTHROID Take 88 mcg by mouth daily.   losartan-hydrochlorothiazide 50-12.5 MG tablet Commonly known as:  HYZAAR TAKE ONE TABLET BY MOUTH ONE TIME DAILY   methotrexate 2.5 MG tablet Take 15 mg by mouth once a week.   multivitamin per tablet Take 1 tablet by mouth daily.   predniSONE 5 MG tablet Commonly known as:  DELTASONE Take 5 mg by mouth daily with breakfast.   ticagrelor 90 MG Tabs tablet Commonly known as:  BRILINTA Take 1 tablet (90 mg total) by mouth 2 (two) times daily.   Vitamin D (Ergocalciferol) 50000 units Caps capsule Commonly known as:  DRISDOL Take 50,000 Units by mouth once a week.      Follow-up Information    Luanne Bras, MD Follow up in 2 week(s).   Specialties:  Interventional Radiology, Radiology Why:  Please follow-up with Dr. Estanislado Pandy in clinic 2 weeks after discharge. Contact information: [place] [address] [phone]            Electronically Signed: Earley Abide, PA-C 12/19/2017, 2:07 PM   I have spent Less Than 30 Minutes discharging [facility].

## 2017-12-20 ENCOUNTER — Other Ambulatory Visit (HOSPITAL_COMMUNITY): Payer: Self-pay | Admitting: Interventional Radiology

## 2017-12-20 ENCOUNTER — Encounter (HOSPITAL_COMMUNITY): Payer: Self-pay | Admitting: Interventional Radiology

## 2017-12-20 DIAGNOSIS — I771 Stricture of artery: Secondary | ICD-10-CM

## 2018-01-07 ENCOUNTER — Ambulatory Visit (HOSPITAL_COMMUNITY)
Admission: RE | Admit: 2018-01-07 | Discharge: 2018-01-07 | Disposition: A | Discharge status: Home / Self Care | Payer: Medicare Other | Source: Ambulatory Visit | Attending: Interventional Radiology | Admitting: Interventional Radiology

## 2018-01-07 DIAGNOSIS — I771 Stricture of artery: Secondary | ICD-10-CM

## 2018-01-07 NOTE — Consult Note (Signed)
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  Referring Physician(s): Garvin Fila  Supervising Physician: Luanne Bras  Patient Status: [facility] - Out-pt  History of Present Illness: [name] is a 84 y.o. male with a past medical history of hypertension, hyperlipidemia, CVA 11/2017, tuberculosis 3.2012, gastric ulcer, adenocarcinoma of colon 1985, erectile dysfunction, hypothyroidism, eczema, osteopenia, rheumatoid arthritis, insomnia, and depression. He is known to [facility] and has been followed by Dr. Estanislado Pandy since 11/2017. He underwent a cerebral angiogram with revascularization of his left vertebrobasilar junction and proximal basilar artery stenosis 12/18/2017 with Dr. Estanislado Pandy.  Patient presents today for follow-up regarding his recent procedure 12/18/2017. Patient awake and alert sitting in wheelchair. Accompanied by wife, daughter, and medical interpretor. Complains of instability with ambulating. States he uses a cane with ambulation. States that this has improved since procedure 12/18/2017. Complains of decreased appetite since procedure. States that he usually eats 3 meals a day with 1 Ensure, but is now eating less than before. Denies headache, weakness, numbness/tingling, dizziness, vision changes, hearing changes, tinnitus, or speech difficulty.  Patient is currently taking Brilinta 90 mg twice daily and Aspirin 81 mg once daily.   Past Medical History:  Diagnosis Date  . Adenocarcinoma ([place]) 1985   colon s/p right hemicolectomy and chemotherapy. F/U with cancer center and Dr. Lucia Gaskins rotinely  . Depression   . Eczema   . Epistaxis    f/u per ENT  . Erectile dysfunction   . Gastric ulcer 12/11   H-Pylori Tx, EGD 3-12: gastritis  . Hay fever   . Head injury 11/2017   with fall  . Hyperlipemia   . Hypertension   . Hypothyroidism    Hypothyroid  . Insomnia   . Osteopenia    per DEXA 07-2008 (Rx fosamax)  . Rheumatoid arthritis ([place])   . Stroke [place])     " balance issue"    Past Surgical History:  Procedure Laterality Date  . COLON SURGERY     Right / Adenocarcinoma / Chemo  . COLONOSCOPY  2005, 2008   Dr. Lucia Gaskins  . INGUINAL HERNIA REPAIR     Right  . IR ANGIO INTRA EXTRACRAN SEL COM CAROTID INNOMINATE BILAT MOD SED  12/13/2017  . IR ANGIO VERTEBRAL SEL VERTEBRAL BILAT MOD SED  12/13/2017  . IR PTA INTRACRANIAL  12/18/2017  . RADIOLOGY WITH ANESTHESIA N/A 12/18/2017   Procedure: Angioplasty with stenting;  Surgeon: Luanne Bras, MD;  Location: [place];  Service: Radiology;  Laterality: N/A;    Allergies: Asa [aspirin] and Penicillin g  Medications: Prior to Admission medications   Medication Sig Start Date End Date Taking? Authorizing Provider  aspirin 81 MG tablet Take 81 mg by mouth daily.      [provider]  fluticasone (FLONASE) 50 MCG/ACT nasal spray Place 2 sprays into the nose daily as needed for allergies.     [provider]  gemfibrozil (LOPID) 600 MG tablet Take 0.5 tablets (300 mg total) by mouth 2 (two) times daily before a meal. 12/12/17   Regalado, Belkys A, MD  levothyroxine (SYNTHROID, LEVOTHROID) 88 MCG tablet Take 88 mcg by mouth daily. 10/20/16 12/18/17  [provider]  losartan-hydrochlorothiazide (HYZAAR) 50-12.5 MG per tablet TAKE ONE TABLET BY MOUTH ONE TIME DAILY 02/23/12   Colon Branch, MD  methotrexate 2.5 MG tablet Take 15 mg by mouth once a week.    [provider]  multivitamin [facility]) per  tablet Take 1 tablet by mouth daily.      [provider]  predniSONE (DELTASONE) 5 MG tablet Take 5 mg by mouth daily with breakfast.    [provider]  ticagrelor (BRILINTA) 90 MG TABS tablet Take 1 tablet (90 mg total) by mouth 2 (two) times daily. 12/19/17   Louk, Bea Graff, PA-C  Vitamin D, Ergocalciferol, (DRISDOL) 50000 units CAPS capsule Take 50,000 Units by mouth once a  week. 08/25/16   [provider]     Family History  Problem Relation Age of Onset  . Diabetes Neg Hx   . Heart attack Neg Hx   . Colon cancer Neg Hx   . Prostate cancer Neg Hx     Social History   Socioeconomic History  . Marital status: Married    Spouse name: Not on file  . Number of children: 2  . Years of education: Not on file  . Highest education level: Not on file  Occupational History    Employer: RETIRED  Social Needs  . Financial resource strain: Not on file  . Food insecurity:    Worry: Not on file    Inability: Not on file  . Transportation needs:    Medical: Not on file    Non-medical: Not on file  Tobacco Use  . Smoking status: Never Smoker  . Smokeless tobacco: Never Used  Substance and Sexual Activity  . Alcohol use: No    Alcohol/week: 0.0 standard drinks  . Drug use: No  . Sexual activity: Not on file  Lifestyle  . Physical activity:    Days per week: Not on file    Minutes per session: Not on file  . Stress: Not on file  Relationships  . Social connections:    Talks on phone: Not on file    Gets together: Not on file    Attends religious service: Not on file    Active member of club or organization: Not on file    Attends meetings of clubs or organizations: Not on file    Relationship status: Not on file  Other Topics Concern  . Not on file  Social History Narrative   Original from [place]   Vegetarian   Daily caffeine use one per day     Review of Systems: A 12 point ROS discussed and pertinent positives are indicated in the HPI above.  All other systems are negative.  Review of Systems  Constitutional: Negative for chills and fever.  HENT: Negative for hearing loss and tinnitus.   Eyes: Negative for visual disturbance.  Respiratory: Negative for shortness of breath and wheezing.   Cardiovascular: Negative for chest pain and palpitations.  Musculoskeletal: Positive for gait problem.  Neurological: Negative for dizziness,  speech difficulty, weakness, numbness and headaches.  Psychiatric/Behavioral: Negative for behavioral problems and confusion.    Vital Signs: There were no vitals taken for this visit.  Physical Exam  Constitutional: He is oriented to person, place, and time. He appears well-developed and well-nourished. No distress.  Pulmonary/Chest: Effort normal. No respiratory distress.  Neurological: He is alert and oriented to person, place, and time.  Skin: Skin is warm and dry.  Psychiatric: He has a normal mood and affect. His behavior is normal. Judgment and thought content normal.  Nursing note and vitals reviewed.    Imaging: Ct Angio Head W Or Wo Contrast  Result Date: 12/11/2017 CLINICAL DATA:  Initial evaluation for intermittent left-sided weakness for several weeks. EXAM: CT ANGIOGRAPHY  HEAD AND NECK TECHNIQUE: Multidetector CT imaging of the head and neck was performed using the standard protocol during bolus administration of intravenous contrast. Multiplanar CT image reconstructions and MIPs were obtained to evaluate the vascular anatomy. Carotid stenosis measurements (when applicable) are obtained utilizing NASCET criteria, using the distal internal carotid diameter as the denominator. CONTRAST:  57mL ISOVUE-370 IOPAMIDOL (ISOVUE-370) INJECTION 76% COMPARISON:  Prior CT from 12/10/2017 FINDINGS: CTA NECK FINDINGS Aortic arch: Visualized aortic arch of normal caliber with normal 3 vessel morphology. Moderate atheromatous plaque within the visualize arch and about the origin of the great vessels without hemodynamically significant stenosis. Visualized subclavian arteries widely patent. Right carotid system: Atheromatous irregularity within the right common carotid artery without hemodynamically significant stenosis. No significant atheromatous narrowing about the right carotid bifurcation. Right ICA widely patent distally to the skull base without stenosis, dissection, or occlusion. Left carotid  system: Left common carotid artery patent from its origin to the bifurcation without hemodynamically significant stenosis. No significant atheromatous narrowing about the left bifurcation. Left ICA patent from the bifurcation to the skull base without stenosis, dissection, or occlusion. Vertebral arteries: Both of the vertebral arteries arise from the subclavian arteries. Left vertebral artery dominant. Atheromatous stenoses with up to approximate 50% narrowing at the origin of the left vertebral artery. Moderate to severe tandem stenoses noted within the pre foraminal right V1 segment (series 8, images 259, 245). Vertebral arteries otherwise patent within the neck without high-grade stenosis or occlusion. Skeleton: No acute osseus abnormality. No discrete lytic or blastic osseous lesions. Mild multilevel cervical spondylolysis and facet arthrosis noted within the cervical spine. Other neck: No acute soft tissue abnormality within the neck. No adenopathy. Salivary glands normal. Thyroid appears to be absent. Upper chest: Visualized upper chest demonstrates no acute finding. Partially visualized lungs are clear. Review of the MIP images confirms the above findings CTA HEAD FINDINGS Anterior circulation: Petrous segments widely patent bilaterally. Mild atheromatous plaque within the cavernous/para clinoid ICAs without hemodynamically significant stenosis. ICA termini widely patent. A1 segments patent bilaterally. Right A1 slightly hypoplastic. Anterior communicating artery patent and normal. Anterior cerebral arteries widely patent to their distal aspects without stenosis. M1 segments mildly irregular but widely patent bilaterally without stenosis. Normal MCA bifurcations. No proximal M2 occlusion distal MCA branches well opacified and symmetric Posterior circulation: Left vertebral artery dominant. There are severe tandem near occlusive stenoses involving the left V4 segment. The hypoplastic right vertebral artery is  markedly irregular and attenuated as it courses into the cranial vault, and occludes just beyond the takeoff of the right posterior inferior cerebral artery. Posterior inferior cerebral arteries themselves are patent bilaterally. Basilar artery is diffusely narrowed and somewhat attenuated with multifocal atheromatous irregularity, but is patent to its distal aspect. Superior cerebral arteries patent bilaterally. Right PCA supplied via the basilar as well as a small right posterior communicating artery. Predominant fetal type origin of the left PCA, although a small hypoplastic left P1 segment is visualized. PCAs patent to their distal aspects, although demonstrate distal small vessel atheromatous irregularity. Venous sinuses: Patent. Anatomic variants: Predominant fetal type origin of the left PCA. Delayed phase: No abnormal enhancement. Review of the MIP images confirms the above findings IMPRESSION: 1. Negative CTA for large vessel occlusion. 2. 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 Hypoplastic right V4 segment occludes prior to the vertebrobasilar junction. Basilar artery diffusely narrowed with multifocal atheromatous irregularity. PCAs are well perfused, with predominant fetal type origin of  the left PCA, with a fairly robust right posterior communicating artery as well. 3. Additional moderate to advanced stenoses involving the proximal V1 segments as above. 4. Additional mild for age atherosclerotic change about the carotid bifurcations and carotid siphons without hemodynamically significant stenosis. Electronically Signed   By: Jeannine Boga M.D.   On: 12/11/2017 03:18   Ct Head Wo Contrast  Result Date: 12/10/2017 CLINICAL DATA:  Dizziness.  Left arm and leg numbness. EXAM: CT HEAD WITHOUT CONTRAST TECHNIQUE: Contiguous axial images were obtained from the base of the skull through the vertex without intravenous  contrast. COMPARISON:  11/27/2017 FINDINGS: Brain: There is atrophy and chronic small vessel disease changes. No acute intracranial abnormality. Specifically, no hemorrhage, hydrocephalus, mass lesion, acute infarction, or significant intracranial injury. Vascular: No hyperdense vessel or unexpected calcification. Skull: No acute calvarial abnormality. Sinuses/Orbits: No acute findings Other: None IMPRESSION: No acute intracranial abnormality. Atrophy, chronic microvascular disease. Electronically Signed   By: Rolm Baptise M.D.   On: 12/10/2017 20:42   Ct Angio Neck W Or Wo Contrast  Result Date: 12/11/2017 CLINICAL DATA:  Initial evaluation for intermittent left-sided weakness for several weeks. EXAM: CT ANGIOGRAPHY HEAD AND NECK TECHNIQUE: Multidetector CT imaging of the head and neck was performed using the standard protocol during bolus administration of intravenous contrast. Multiplanar CT image reconstructions and MIPs were obtained to evaluate the vascular anatomy. Carotid stenosis measurements (when applicable) are obtained utilizing NASCET criteria, using the distal internal carotid diameter as the denominator. CONTRAST:  73mL ISOVUE-370 IOPAMIDOL (ISOVUE-370) INJECTION 76% COMPARISON:  Prior CT from 12/10/2017 FINDINGS: CTA NECK FINDINGS Aortic arch: Visualized aortic arch of normal caliber with normal 3 vessel morphology. Moderate atheromatous plaque within the visualize arch and about the origin of the great vessels without hemodynamically significant stenosis. Visualized subclavian arteries widely patent. Right carotid system: Atheromatous irregularity within the right common carotid artery without hemodynamically significant stenosis. No significant atheromatous narrowing about the right carotid bifurcation. Right ICA widely patent distally to the skull base without stenosis, dissection, or occlusion. Left carotid system: Left common carotid artery patent from its origin to the bifurcation without  hemodynamically significant stenosis. No significant atheromatous narrowing about the left bifurcation. Left ICA patent from the bifurcation to the skull base without stenosis, dissection, or occlusion. Vertebral arteries: Both of the vertebral arteries arise from the subclavian arteries. Left vertebral artery dominant. Atheromatous stenoses with up to approximate 50% narrowing at the origin of the left vertebral artery. Moderate to severe tandem stenoses noted within the pre foraminal right V1 segment (series 8, images 259, 245). Vertebral arteries otherwise patent within the neck without high-grade stenosis or occlusion. Skeleton: No acute osseus abnormality. No discrete lytic or blastic osseous lesions. Mild multilevel cervical spondylolysis and facet arthrosis noted within the cervical spine. Other neck: No acute soft tissue abnormality within the neck. No adenopathy. Salivary glands normal. Thyroid appears to be absent. Upper chest: Visualized upper chest demonstrates no acute finding. Partially visualized lungs are clear. Review of the MIP images confirms the above findings CTA HEAD FINDINGS Anterior circulation: Petrous segments widely patent bilaterally. Mild atheromatous plaque within the cavernous/para clinoid ICAs without hemodynamically significant stenosis. ICA termini widely patent. A1 segments patent bilaterally. Right A1 slightly hypoplastic. Anterior communicating artery patent and normal. Anterior cerebral arteries widely patent to their distal aspects without stenosis. M1 segments mildly irregular but widely patent bilaterally without stenosis. Normal MCA bifurcations. No proximal M2 occlusion distal MCA branches well opacified and symmetric Posterior circulation: Left vertebral  artery dominant. There are severe tandem near occlusive stenoses involving the left V4 segment. The hypoplastic right vertebral artery is markedly irregular and attenuated as it courses into the cranial vault, and occludes  just beyond the takeoff of the right posterior inferior cerebral artery. Posterior inferior cerebral arteries themselves are patent bilaterally. Basilar artery is diffusely narrowed and somewhat attenuated with multifocal atheromatous irregularity, but is patent to its distal aspect. Superior cerebral arteries patent bilaterally. Right PCA supplied via the basilar as well as a small right posterior communicating artery. Predominant fetal type origin of the left PCA, although a small hypoplastic left P1 segment is visualized. PCAs patent to their distal aspects, although demonstrate distal small vessel atheromatous irregularity. Venous sinuses: Patent. Anatomic variants: Predominant fetal type origin of the left PCA. Delayed phase: No abnormal enhancement. Review of the MIP images confirms the above findings IMPRESSION: 1. Negative CTA for large vessel occlusion. 2. 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 Hypoplastic right V4 segment occludes prior to the vertebrobasilar junction. Basilar artery diffusely narrowed with multifocal atheromatous irregularity. PCAs are well perfused, with predominant fetal type origin of the left PCA, with a fairly robust right posterior communicating artery as well. 3. Additional moderate to advanced stenoses involving the proximal V1 segments as above. 4. Additional mild for age atherosclerotic change about the carotid bifurcations and carotid siphons without hemodynamically significant stenosis. Electronically Signed   By: Jeannine Boga M.D.   On: 12/11/2017 03:18   Mr Brain Wo Contrast  Result Date: 12/11/2017 CLINICAL DATA:  84 year old male with waxing and waning left side numbness and weakness. CTA head and neck earlier today positive for severe stenosis, near occlusion of the dominant left vertebral artery. EXAM: MRI HEAD WITHOUT CONTRAST TECHNIQUE: Multiplanar, multiecho pulse sequences of  the brain and surrounding structures were obtained without intravenous contrast. COMPARISON:  CTA head and neck 0119 hours today. Brain MRI 11/18/2012. FINDINGS: Brain: Patchy and confluent restricted diffusion in both cerebellar hemispheres, more extensive on the right (series 3, image 6). The PICA territories are primarily affected. There is borderline involvement of the right SCA territory. There is also a punctate focus of restricted diffusion in the right dorsal pons (series 3, image 12). Mild associated T2 and FLAIR hyperintensity in the in the acutely affected areas with no hemorrhage or mass effect. No other No restricted diffusion or evidence of acute infarction. No acute or chronic cerebral blood products identified. Moderate chronic scattered bilateral cerebral white matter T2 and FLAIR hyperintensity has mildly progressed since 2014. No midline shift, mass effect, evidence of mass lesion, ventriculomegaly, extra-axial collection or acute intracranial hemorrhage. Cervicomedullary junction and pituitary are within normal limits. Vascular: Major intracranial vascular flow voids are stable since 2014. Skull and upper cervical spine: Degenerative ligamentous hypertrophy about the odontoid. Stable visible cervical spine and osseous structures. Sinuses/Orbits: Stable and negative. Other: Mastoids are clear. Visible internal auditory structures appear normal. Scalp and face soft tissues are negative. IMPRESSION: 1. 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 No associated hemorrhage or mass effect. 2. Chronic supratentorial signal changes suggestive of small vessel disease have mildly progressed since 2014. Electronically Signed   By: Genevie Ann M.D.   On: 12/11/2017 13:14   Ir Pta Intracranial  Result Date: 12/20/2017 INDICATION: Bilateral cerebellar ischemic stroke secondary to severe pre occlusive narrowing of the dominant left vertebrobasilar junction proximal to  the basilar artery, and 70% stenosis of  the proximal basilar artery. EXAM: LEFT VERTEBROBASILAR ANGIOPLASTY, AND PROXIMAL BASILAR ARTERY ANGIOPLASTY COMPARISON:  Diagnostic catheter arteriogram of 12/11/2017. MEDICATIONS: Vancomycin 1 g IV antibiotic was administered within 1 hour of the procedure. ANESTHESIA/SEDATION: General anesthesia. CONTRAST:  Isovue 300 approximately 80 mL. FLUOROSCOPY TIME:  Fluoroscopy Time: 39 minutes 6 seconds (2136 mGy). COMPLICATIONS: None immediate. TECHNIQUE: Informed written consent was obtained from the patient after a thorough discussion of the procedural risks, benefits and alternatives. All questions were addressed. Maximal Sterile Barrier Technique was utilized including caps, mask, sterile gowns, sterile gloves, sterile drape, hand hygiene and skin antiseptic. A timeout was performed prior to the initiation of the procedure. The right groin was prepped and draped in the usual sterile technique. Thereafter, a micropuncture access was obtained at the right common femoral artery. Over a 0.035 inch J-tip guidewire, a 5 French Pinnacle sheath was inserted. Through this, and also over a 0.035 inch guidewire a 5 [place] JB 1 catheter was advanced to the aortic arch region and selectively positioned in the left subclavian artery. Arteriograms were then performed centered intracranially of the left vertebral artery. FINDINGS: Again noted was the 50% stenosis at the origin of the dominant left vertebral artery. However, contrast was seen to advance unimpeded to the cranial skull base. A severe high-grade stenosis of the left vertebrobasilar junction proximal to the basilar artery was again identified. No left posterior inferior cerebellar artery was noted. More distally, a 70% stenosis of the proximal basilar artery was again noted. Pre and post stenotic dilatation was noted. More distally, the basilar artery appeared to be moderately arteriosclerotic with mild stenosis to just distal to  the level of the origins of the anterior-inferior cerebellar arteries. PROCEDURE: ENDOVASCULAR REVASCULARIZATION OF PRE OCCLUSIVE DOMINANT LEFT VERTEBROBASILAR JUNCTION, AND OF THE PROXIMAL BASILAR ARTERY WITH BALLOON ANGIOPLASTY The diagnostic JB 1 catheter in the distal left subclavian artery was then exchanged over a 0.035 inch 300 cm Rosen exchange guidewire for a 65 cm 6 French neurovascular Arrow sheath using biplane roadmap technique and constant fluoroscopic guidance. Good aspiration obtained from the hub of the neurovascular sheath. A gentle contrast injection demonstrated no evidence of dissections or of intraluminal filling defects. This was then connected to continuous heparinized saline infusion. Over a 0.035 inch Roadrunner guidewire, using biplane roadmap technique and constant fluoroscopic guidance, a 5 French Navien 115 cm guide catheter was then advanced without difficulty to the distal vertical segment of the left vertebral artery. The guidewire was removed. Good aspiration obtained from the hub of the Navien guide catheter. A control arteriogram performed centered intracranially again demonstrated no change in the intracranial circulation as described above. At this time, measurements were performed of the areas of severe stenosis proximally and distally, and also the proximal basilar artery proximal and distal to the 70% stenosis. It was decided to proceed with balloon angioplasty using a 2.25 mm x 15 mm Gateway balloon microcatheter. The balloon had been prepped with 50% contrast and 50% heparinized saline infusion. Over a 0.014 inch Softip Synchro micro guidewire, the balloon was advanced in combination to the distal left vertebral artery was navigated without difficulty to the proximal aspect of the proximal severe stenosis. With the micro guidewire leading with a J-tip configuration to avoid dissections or inducing spasm, and using a torque device, the proximal severe pre occlusive stenosis  was crossed with the micro guidewire followed by the balloon. The proximal and distal markers were placed equal distance from the segmental severe stenosis. Thereafter, a slow inflation  of the balloon was then performed using a micro inflation syringe device via micro tubing. Inflations were carried out slowly at 4.5 atmosphere intervals to 4 atmospheres achieving a 2.19 mm diameter of the balloon. Once there, this was left inflated for 1-1/2 minutes. Thereafter, the balloon was deflated and retrieved proximally where its wire was maintained distally. A control arteriogram performed through the 5 [place] Navien guide catheter in the left vertebrobasilar junction demonstrates significantly improved caliber and flow through the angioplastied segment. A second angioplasty was then performed again as above. At this time the balloon was inflated to 5.2 atmospheres achieving a diameter of 2.22 mm. Once there this was maintained for approximately 45 seconds. The balloon was then deflated and retrieved proximally. A control arteriogram performed through the guide catheter in the left vertebral artery demonstrated also modestly improved caliber and flow through this segment. There was now prompt opacification of the distal basilar artery, and also the posterior cerebral arteries, the superior cerebellar arteries and the anterior-inferior cerebellar arteries. The Navien guide catheter in the left vertebral artery demonstrated significant improved caliber and flow through the angioplastied segment. This was then watched for approximately 10 minutes. At the end of this, a third angioplasty was performed again as mentioned above. At this time the balloon was inflated at 6 atmospheres achieving a 2.25 mm diameter. This was maintained for approximately 45 seconds. The balloon was then deflated and retrieved proximally. A control arteriogram performed through the guide catheter in the left vertebral artery now demonstrates  significant caliber and flow through the angioplastied segment with an approximately 50-60% patency on both AP and lateral projections. At this time, the balloon was then advanced to the proximal basilar artery with a control angioplasty being performed at the site of the 70% stenosis. Here the balloon was advanced and inflated to 3 atmospheres achieving a 2.15 mm diameter. This was maintained for approximately 30 seconds. The balloon was deflated and retrieved proximally. Control arteriograms were then performed 15, 30 and 40 minutes post angioplasty. These continued to maintain a patency of approximately 60% of the proximal and the distal angioplasty sites. It was, therefore, decided to not place a stent. A final control arteriogram performed through the Navien guide catheter in the left vertebral artery demonstrated the angioplasty sites to be 50-60% patent on AP and lateral projections in the left vertebrobasilar junction, and the proximal basilar artery. Brisk opacification was seen of the distal basilar artery, the right posterior cerebral artery, the superior cerebellar arteries and the anterior inferior cerebellar arteries. The 5 [place] Navien guide catheter was then retrieved and removed. A control arteriogram performed through the Arrow neurovascular sheath in the proximal left subclavian artery continued to demonstrate wide patency of the left vertebral artery at its origin with the 50% stenosis unchanged. The patient's neurologic status and hemodynamically the patient remained stable. The patient's ACT was maintained in the region of approximately 220 seconds. No acute blood pressure or angiographic extravasation or filling defects were seen. The patient's neurovascular sheath was then retrieved into the abdominal aorta and exchanged over a J-tip guidewire for a 6 French Pinnacle sheath. This in turn was then removed with the successful application of a 6 [place] Angio-Seal device. The patient's left  groin appeared soft without evidence of a hematoma. Distal pulses were palpable in the dorsalis pedis, and posterior regions and both unchanged from the beginning of the procedure. The patient's general anesthesia was reversed, and the patient was extubated. Upon recovery the patient demonstrated  no new neurological signs or symptoms. He did not complain of any headaches, nausea, vomiting, or visual symptoms. Motor wise equal strength was seen proportional to after. The patient was then transferred to the neuro ICU to continue on IV heparin, and close neurologic observations. Towards the late evening, the patient was able to keep in liquids without difficulty. The following morning, the patient's IV heparin was stopped and the patient was switched to aspirin 81 mg, and Brilinta 90 mg a day. He was then encouraged to increase his p.o. intake which he had no problems with. He was set up in a chair and ambulated with the help of physical therapy. He was then discharged home under the care of his family. The patient was advised to maintain adequate hydration and also to continue taking his medications. He was also advised to refrain from stooping, bending or lifting weights above 10 pounds. He was advised to keep his clinic appointment in 2 weeks time. Questions were answered to their satisfaction. He was then discharged home under the care of his family. 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 PLAN: Return to clinic in 2 weeks time for follow-up. Electronically Signed   By: Luanne Bras M.D.   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 EXAM: IR ANGIO VERTEBRAL SEL VERTEBRAL BILAT MOD SED; IR ANGIO EXTRACRAN SELECT COM CAROTID INNOMINATE BILAT MOD SED COMPARISON:  MRI of the brain of 12/11/2017, and CT angiogram of the head and neck of 12/11/2017. MEDICATIONS: Heparin 1000 units IV; no antibiotic was administered within 1 hour of the procedure. ANESTHESIA/SEDATION: Versed 1 mg IV; Fentanyl 25 mcg IV Moderate Sedation Time:  27 minutes The patient was continuously monitored during the procedure by the interventional radiology nurse under my direct supervision. CONTRAST:  Isovue 300 approximately 55 mL. FLUOROSCOPY TIME:  Fluoroscopy Time: 10 minutes 36 seconds (1079 mGy). COMPLICATIONS: Non none immediate.  E immediate. TECHNIQUE: Informed written consent was obtained from the patient after a thorough discussion of the procedural risks, benefits and alternatives. All questions were addressed. Maximal Sterile Barrier Technique was utilized including caps, mask, sterile gowns, sterile gloves, sterile drape, hand hygiene and skin antiseptic. A timeout was performed prior to the initiation of the procedure. The right groin was prepped and draped in the usual sterile fashion. Thereafter using modified Seldinger technique, transfemoral access into the right common femoral artery was obtained without difficulty. Over a 0.035 inch guidewire, a 5 French Pinnacle sheath was inserted. Through this, and also over 0.035 inch guidewire, a 5 [place] JB 1 catheter was advanced to the aortic arch region and selectively positioned in the right common carotid artery, the right vertebral artery, the left common carotid artery and the left vertebral artery. FINDINGS: The right common carotid arteriogram demonstrates the right external carotid artery and its major branches to be widely patent. The right internal carotid artery at the bulb to the cranial skull base is widely patent. The petrous, cavernous and supraclinoid segments are widely patent. The right  middle cerebral artery and the right anterior cerebral artery opacify into the capillary and venous phases. Transient opacification via the right posterior communicating artery of the right posterior cerebral artery distribution  is seen. The non dominant right vertebral artery origin is widely patent. The vessel has approximately 50% stenosis in its proximal 1/3. More distally, the vessel is seen to opacify to the cranial skull base where it terminates in the ipsilateral right posterior-inferior cerebellar artery. The left common carotid arteriogram demonstrates the left external carotid artery and its major branches to be widely patent. The left internal carotid artery at the bulb to the cranial skull base opacifies widely. The petrous, cavernous and supraclinoid segments are widely patent. A left posterior communicating artery is seen transiently opacifying the left posterior cerebral artery distribution. The left middle cerebral artery and the left anterior cerebral artery opacify into the capillary and venous phases. The dominant left vertebral artery proximally demonstrates an approximately 30% stenosis. More distally, the vessel is seen to opacify to the cranial skull base. At the level of the distal left vertebrobasilar junction there is a segmental pre occlusive stenosis of the dominant left vertebrobasilar junction with mild poststenotic dilatation. Distal to this there is a 70% stenosis of the proximal basilar artery. Contrast is seen to extend to the distal basilar artery to the level of the anterior-inferior cerebellar arteries. 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 Approximately 30% stenosis of the dominant left vertebral artery proximally. Approximately 50% stenosis of the non dominant right vertebral artery proximally. Right non dominant vertebral artery terminates in the right posterior-inferior  cerebellar artery. PLAN: Findings discussed with the patient and his daughter. Electronically Signed   By: Luanne Bras M.D.   On: 12/13/2017 15:18   Ir Angio Vertebral Sel Vertebral Bilat Mod Sed  Result Date: 12/17/2017 CLINICAL DATA:  Posterior fossa ischemic strokes secondary to vertebrobasilar severe stenosis. EXAM: IR ANGIO VERTEBRAL SEL VERTEBRAL BILAT MOD SED; IR ANGIO EXTRACRAN SELECT COM CAROTID INNOMINATE BILAT MOD SED COMPARISON:  MRI of the brain of 12/11/2017, and CT angiogram of the head and neck of 12/11/2017. MEDICATIONS: Heparin 1000 units IV; no antibiotic was administered within 1 hour of the procedure. ANESTHESIA/SEDATION: Versed 1 mg IV; Fentanyl 25 mcg IV Moderate Sedation Time:  27 minutes The patient was continuously monitored during the procedure by the interventional radiology nurse under my direct supervision. CONTRAST:  Isovue 300 approximately 55 mL. FLUOROSCOPY TIME:  Fluoroscopy Time: 10 minutes 36 seconds (1079 mGy). COMPLICATIONS: Non none immediate.  E immediate. TECHNIQUE: Informed written consent was obtained from the patient after a thorough discussion of the procedural risks, benefits and alternatives. All questions were addressed. Maximal Sterile Barrier Technique was utilized including caps, mask, sterile gowns, sterile gloves, sterile drape, hand hygiene and skin antiseptic. A timeout was performed prior to the initiation of the procedure. The right groin was prepped and draped in the usual sterile fashion. Thereafter using modified Seldinger technique, transfemoral access into the right common femoral artery was obtained without difficulty. Over a 0.035 inch guidewire, a 5 French Pinnacle sheath was inserted. Through this, and also over 0.035 inch guidewire, a 5 [place] JB 1 catheter was advanced to the aortic arch region and selectively positioned in the right common carotid artery, the right vertebral artery, the left common carotid artery and the left vertebral  artery. FINDINGS: The right common carotid arteriogram demonstrates the right external carotid artery and its major branches to be widely patent. The right internal carotid artery at the bulb to the cranial skull base is widely patent. The petrous, cavernous and supraclinoid segments are widely  patent. The right middle cerebral artery and the right anterior cerebral artery opacify into the capillary and venous phases. Transient opacification via the right posterior communicating artery of the right posterior cerebral artery distribution is seen. The non dominant right vertebral artery origin is widely patent. The vessel has approximately 50% stenosis in its proximal 1/3. More distally, the vessel is seen to opacify to the cranial skull base where it terminates in the ipsilateral right posterior-inferior cerebellar artery. The left common carotid arteriogram demonstrates the left external carotid artery and its major branches to be widely patent. The left internal carotid artery at the bulb to the cranial skull base opacifies widely. The petrous, cavernous and supraclinoid segments are widely patent. A left posterior communicating artery is seen transiently opacifying the left posterior cerebral artery distribution. The left middle cerebral artery and the left anterior cerebral artery opacify into the capillary and venous phases. The dominant left vertebral artery proximally demonstrates an approximately 30% stenosis. More distally, the vessel is seen to opacify to the cranial skull base. At the level of the distal left vertebrobasilar junction there is a segmental pre occlusive stenosis of the dominant left vertebrobasilar junction with mild poststenotic dilatation. Distal to this there is a 70% stenosis of the proximal basilar artery. Contrast is seen to extend to the distal basilar artery to the level of the anterior-inferior cerebellar arteries. IMPRESSION: Severe pre occlusive 95% plus stenosis of the dominant  left vertebrobasilar junction, and of 70% of the proximal basilar artery. Approximately 30% stenosis of the dominant left vertebral artery proximally. Approximately 50% stenosis of the non dominant right vertebral artery proximally. Right non dominant vertebral artery terminates in the right posterior-inferior cerebellar artery. PLAN: Findings discussed with the patient and his daughter. Electronically Signed   By: Luanne Bras M.D.   On: 12/13/2017 15:18    Labs:  CBC: Recent Labs    12/10/17 2012 12/10/17 2024 12/13/17 0437 12/18/17 0901 12/19/17 0406  WBC 7.7  --  9.7 8.8 15.3*  HGB 13.2 12.9* 13.0 13.3 10.8*  HCT 41.4 38.0* 39.8 41.5 33.6*  PLT 215  --  208 215 202    COAGS: Recent Labs    12/10/17 2012 12/13/17 0437 12/18/17 0901  INR 1.01 1.02 1.03  APTT 32  --  33    BMP: Recent Labs    12/10/17 2012 12/10/17 2024 12/13/17 0437 12/18/17 0901 12/19/17 0406  NA 138 139 141 142 139  K 3.8 3.6 4.0 4.1 3.9  CL 99 99 106 106 109  CO2 27  --  26 23 22   GLUCOSE 106* 105* 109* 94 115*  BUN 19 21 17 16 18   CALCIUM 9.7  --  9.4 9.1 8.2*  CREATININE 1.11 1.10 1.05 1.06 1.06  GFRNONAA 58*  --  >60 >60 >60  GFRAA >60  --  >60 >60 >60    LIVER FUNCTION TESTS: Recent Labs    12/10/17 2012  BILITOT 0.5  AST 26  ALT 18  ALKPHOS 67  PROT 7.2  ALBUMIN 3.8    TUMOR MARKERS: No results for input(s): AFPTM, CEA, CA199, CHROMGRNA in the last 8760 hours.  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 Reviewed imaging with patient and family. Brought to their attention was patient's left vertebrobasilar junction and proximal basilar artery stenosis, both before and after treatment. Explained that the best course of management for his stenosis at this time is with routine imaging scans to monitor for changes.  Discussed patient's cholesterol. Instructed patient to follow-up with his PCP regularly for cholesterol  monitoring. Instructed patient to continue taking his cholesterol lowering medications as directed by PCP.  Discussed patient's diet and decreased appetitie. Instructed patient to eat 3 meals a day plus 1 Ensure supplement. Instructed patient that if he eats less than 3 meals a day, he must supplement with 1 additional Ensure (as in drink 2 Ensures on those days).  Plan for follow-up with CTA head/neck (with contrast) 3-4 months from procedure 12/18/2017. Informed patient that our schedulers will call him to set up this imaging scan. Instructed patient to continue taking Brilinta 90 mg twice daily and Aspirin 81 mg once daily. Instructed patient to increase water intake. Instructed patient to continue following-up with neurology regularly. Instructed patient to call us immediately if he notices any neurological symptoms.  All questions answered and concerns addressed. Patient and family convey understanding and agree with plan.  Thank you for this interesting consult.  I greatly enjoyed meeting [name] and look forward to participating in their care.  A copy of this report was sent to the requesting provider on this date.  Electronically Signed: Earley Abide, PA-C 01/07/2018, 10:37 AM   I spent a total of 40 Minutes in face to face in clinical consultation, greater than 50% of which was counseling/coordinating care for left vertebrobasilar junction and proximal basilar artery stenosis.

## 2018-03-05 ENCOUNTER — Encounter: Payer: Self-pay | Admitting: Adult Health

## 2018-03-05 ENCOUNTER — Ambulatory Visit: Payer: Medicare Other | Admitting: Adult Health

## 2018-03-05 VITALS — BP 107/55 | HR 63 | Ht 61.0 in | Wt 122.6 lb

## 2018-03-05 DIAGNOSIS — I10 Essential (primary) hypertension: Secondary | ICD-10-CM

## 2018-03-05 DIAGNOSIS — E785 Hyperlipidemia, unspecified: Secondary | ICD-10-CM

## 2018-03-05 DIAGNOSIS — I6322 Cerebral infarction due to unspecified occlusion or stenosis of basilar arteries: Secondary | ICD-10-CM | POA: Diagnosis not present

## 2018-03-05 NOTE — Patient Instructions (Addendum)
Continue aspirin 81 mg daily and Brilinta  and Lopid  for secondary stroke prevention Continue taking both aspirin 81mg  and brilinta until you are told to stop. Ensure you are no longer taking Plavix (clopidogrel)  Continue to follow up with PCP regarding cholesterol and blood pressure management    Follow up with Dr. Estanislado Pandy for repeat imaging after procedure (you will be called to schedule this appointment)  Do mind exercises such as cross word puzzles, word search, card games and reading  You are cleared to return to driving at this time. Please have someone ride with you for the first couple of times and after that, drive short distances to places you know, avoid main roads/highways and avoid nighttime driving  Continue to monitor blood pressure at home  Maintain strict control of hypertension with blood pressure goal below 130/90, diabetes with hemoglobin A1c goal below 6.5% and cholesterol with LDL cholesterol (bad cholesterol) goal below 70 mg/dL. I also advised the patient to eat a healthy diet with plenty of whole grains, cereals, fruits and vegetables, exercise regularly and maintain ideal body weight.  Followup in the future with me in 3 months or call earlier if needed       Thank you for coming to see [place] at [facility]. I hope we have been able to provide you high quality care today.  You may receive a patient satisfaction survey over the next few weeks. We would appreciate your feedback and comments so that we may continue to improve ourselves and the health of our patients.

## 2018-03-05 NOTE — Progress Notes (Signed)
I agree with the above plan 

## 2018-03-05 NOTE — Progress Notes (Signed)
[facility] [address] [place]. [place] [address] [phone]       OFFICE FOLLOW UP NOTE  [name] Date of Birth:  [birth year] Medical Record Number:  [id number]   Reason for Referral:  hospital stroke follow up  CHIEF COMPLAINT:  Chief Complaint  Patient presents with  . 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 History obtained from patient with interpreter assistance and chart review. Reviewed all radiology images and labs personally.  [name] is a 84 y.o. [name] with history of HTN and colon cancer who presented with intermittent L hemiparesis that worsens with dizziness for the past several weeks.  CT head reviewed and was negative for acute infarct.  CTA head and neck was negative for LVO but did show severe VB disease (LVA near occlusion, hypoplastic right V4 occludes prior to the VBJ, BA narrow, PCAs perfused, robust right PCA, V1 arthrosclerosis) along with additional mild arthrosclerosis.  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  2D echo showed an EF of 60 to 65%.  LDL 49 and A1c 6.3.  Aspirin 81 PTA and recommended DAPT for 3 months and Plavix alone.  Discussion between Dr. Willaim Rayas and daughter regarding elective angioplasty/stenting with Dr. Estanislado Pandy the following week if stable or sooner if recurrent symptoms.  Therapies recommended home health PT and was discharged home with family in stable condition. Patient presented to [facility] 12/18/2017 for cerebral angiogram with distal vertebrobasilar junction/proximal  basilar artery stenosis angioplasty with Dr. Estanislado Pandy.  Procedure completed without major complications and recommended Brilinta 90 mg twice daily along with aspirin 81 mg daily and was discharged home with recommendations to follow-up with Dr. Estanislado Pandy in 2 weeks time.  Patient did follow-up in Dr. Arlean Hopping office on 01/07/2018 and per notes, continued to do well post procedure.  Recommended follow-up with CTA head/neck 3 to 4 months post procedure along with continuation of Brilinta 90 mg twice daily and aspirin 81 mg daily.  Patient states overall he has been doing well without residual deficits or recurring of symptoms.  He does endorse slight short-term memory concerns since his stroke but has been greatly improving.  He has continued to take both the aspirin and Brilinta without side effects of bleeding or bruising.  Continues to take Lopid for HLD management.  Blood pressure today 107/55.  He is questioning whether he can begin driving again at this time.  He continues to live at home with his wife where they received 8 services twice weekly to assist with cleaning or any other needed assistance.  No further concerns at this time.  Denies new or worsening stroke/TIA symptoms.   ROS:   14 system review of systems performed and negative with exception of constipation, runny nose and memory loss  PMH:  Past Medical History:  Diagnosis Date  . Adenocarcinoma ([place]) 1985   colon s/p right hemicolectomy and chemotherapy. F/U with cancer center and Dr. Lucia Gaskins rotinely  . Depression   . Eczema   . Epistaxis    f/u per ENT  . Erectile dysfunction   .  Gastric ulcer 12/11   H-Pylori Tx, EGD 3-12: gastritis  . Hay fever   . Head injury 11/2017   with fall  . Hyperlipemia   . Hypertension   . Hypothyroidism    Hypothyroid  . Insomnia   . Osteopenia     per DEXA 07-2008 (Rx fosamax)  . Rheumatoid arthritis ([place])   . Stroke [facility])     " balance issue"    PSH:  Past Surgical History:    Procedure Laterality Date  . COLON SURGERY     Right / Adenocarcinoma / Chemo  . COLONOSCOPY  2005, 2008   Dr. Lucia Gaskins  . INGUINAL HERNIA REPAIR     Right  . IR ANGIO INTRA EXTRACRAN SEL COM CAROTID INNOMINATE BILAT MOD SED  12/13/2017  . IR ANGIO VERTEBRAL SEL VERTEBRAL BILAT MOD SED  12/13/2017  . IR PTA INTRACRANIAL  12/18/2017  . RADIOLOGY WITH ANESTHESIA N/A 12/18/2017   Procedure: Angioplasty with stenting;  Surgeon: Luanne Bras, MD;  Location: [facility];  Service: Radiology;  Laterality: N/A;    Social History:  Social History   Socioeconomic History  . Marital status: Married    Spouse name: Not on file  . Number of children: 2  . Years of education: Not on file  . Highest education level: Not on file  Occupational History    Employer: RETIRED  Social Needs  . Financial resource strain: Not on file  . Food insecurity:    Worry: Not on file    Inability: Not on file  . Transportation needs:    Medical: Not on file    Non-medical: Not on file  Tobacco Use  . Smoking status: Never Smoker  . Smokeless tobacco: Never Used  Substance and Sexual Activity  . Alcohol use: No    Alcohol/week: 0.0 standard drinks  . Drug use: No  . Sexual activity: Not on file  Lifestyle  . Physical activity:    Days per week: Not on file    Minutes per session: Not on file  . Stress: Not on file  Relationships  . Social connections:    Talks on phone: Not on file    Gets together: Not on file    Attends religious service: Not on file    Active member of club or organization: Not on file    Attends meetings of clubs or organizations: Not on file    Relationship status: Not on file  . Intimate partner violence:    Fear of current or ex partner: Not on file    Emotionally abused: Not on file    Physically abused: Not on file    Forced sexual activity: Not on file  Other Topics Concern  . Not on file  Social History Narrative   Original from [place]   Vegetarian   Daily  caffeine use one per day    Family History:  Family History  Problem Relation Age of Onset  . Diabetes Neg Hx   . Heart attack Neg Hx   . Colon cancer Neg Hx   . Prostate cancer Neg Hx     Medications:   Current Outpatient Medications on File Prior to Visit  Medication Sig Dispense Refill  . aspirin 81 MG tablet Take 81 mg by mouth daily.      . fluticasone (FLONASE) 50 MCG/ACT nasal spray Place 2 sprays into the nose daily as needed for allergies.     Marland Kitchen gemfibrozil (LOPID) 600 MG tablet Take 0.5  tablets (300 mg total) by mouth 2 (two) times daily before a meal. 60 tablet 0  . gemfibrozil (LOPID) 600 MG tablet Take by mouth.    . levothyroxine (SYNTHROID, LEVOTHROID) 88 MCG tablet Take 88 mcg by mouth daily.    Marland Kitchen losartan-hydrochlorothiazide (HYZAAR) 50-12.5 MG per tablet TAKE ONE TABLET BY MOUTH ONE TIME DAILY 30 tablet 0  . methotrexate (RHEUMATREX) 2.5 MG tablet Take 6 tabs by mouth once a week.    . multivitamin (THERAGRAN) per tablet Take 1 tablet by mouth daily.      Marland Kitchen omeprazole (PRILOSEC) 40 MG capsule     . predniSONE (DELTASONE) 5 MG tablet Take 5 mg by mouth daily with breakfast.    . ticagrelor (BRILINTA) 90 MG TABS tablet Take 1 tablet (90 mg total) by mouth 2 (two) times daily. 60 tablet 3  . Vitamin D, Ergocalciferol, (DRISDOL) 50000 units CAPS capsule Take 50,000 Units by mouth once a week.     No current facility-administered medications on file prior to visit.     Allergies:   Allergies  Allergen Reactions  . Asa [Aspirin] Other (See Comments)    Gastric symptoms can tolerate the baby ASA but no more than that. Pt. Is taking ASA 81mg  now.  . Penicillin G Rash     Physical Exam  Vitals:   03/05/18 0809  BP: (!) 107/55  Pulse: 63  Weight: 122 lb 9.6 oz (55.6 kg)  Height: 5\' 1"  (1.549 m)   Body mass index is 23.17 kg/m. No exam data present  General: well developed, well nourished, pleasant elderly male, seated, in no evident distress Head: head  normocephalic and atraumatic.   Neck: supple with no carotid or supraclavicular bruits Cardiovascular: regular rate and rhythm, no murmurs Musculoskeletal: no deformity Skin:  no rash/petichiae Vascular:  Normal pulses all extremities  Neurologic Exam Mental Status: Awake and fully alert. Oriented to place and time. Recent and remote memory intact. Attention span, concentration and fund of knowledge appropriate. Mood and affect appropriate.  Cranial Nerves: Fundoscopic exam reveals sharp disc margins. Pupils equal, briskly reactive to light. Extraocular movements full without nystagmus. Visual fields full to confrontation. Hearing intact. Facial sensation intact. Face, tongue, palate moves normally and symmetrically.  Motor: Normal bulk and tone. Normal strength in all tested extremity muscles. Sensory.: intact to touch , pinprick , position and vibratory sensation.  Coordination: Rapid alternating movements normal in all extremities. Finger-to-nose and heel-to-shin performed accurately bilaterally. Gait and Station: Arises from chair without difficulty. Stance is normal. Gait demonstrates normal stride length and balance without assistance of cane. Able to heel, toe with mild difficulty but unable to tandem walk without difficulty.  Reflexes: 1+ and symmetric. Toes downgoing.    NIHSS  0 Modified Rankin  1    Diagnostic Data (Labs, Imaging, Testing)  CT HEAD WO CONTRAST 12/10/2017 IMPRESSION: No acute intracranial abnormality. Atrophy, chronic microvascular disease.  MR BRAIN WO CONTRAST 12/11/2017 IMPRESSION: 1. 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 No associated hemorrhage or mass effect. 2. Chronic supratentorial signal changes suggestive of small vessel disease have mildly progressed since 2014.  CT ANGIO HEAD W OR WO CONTRAST CT ANGIO NECK W OR WO CONTRAST 12/11/2017 IMPRESSION: 1. Negative CTA for large vessel  occlusion. 2. 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 Hypoplastic right V4 segment occludes prior to the vertebrobasilar junction. Basilar artery diffusely narrowed with multifocal atheromatous irregularity.  PCAs are well perfused, with predominant fetal type origin of the left PCA, with a fairly robust right posterior communicating artery as well. 3. Additional moderate to advanced stenoses involving the proximal V1 segments as above. 4. Additional mild for age atherosclerotic change about the carotid bifurcations and carotid siphons without hemodynamically significant stenosis.  ECHOCARDIOGRAM 12/11/2017 Study Conclusions - Left ventricle: The cavity size was normal. There was mild focal   basal hypertrophy of the septum. Systolic function was normal.   The estimated ejection fraction was in the range of 60% to 65%.   Wall motion was normal; there were no regional wall motion   abnormalities. Doppler parameters are consistent with abnormal   left ventricular relaxation (grade 1 diastolic dysfunction).   Doppler parameters are consistent with high ventricular filling   pressure. - Aortic valve: Transvalvular velocity was within the normal range.   There was no stenosis. There was mild regurgitation. Valve area   (VTI): 2.22 cm^2. Valve area (Vmean): 1.94 cm^2. - Mitral valve: Transvalvular velocity was within the normal range.   There was no evidence for stenosis. There was trivial   regurgitation. - Right ventricle: The cavity size was normal. Wall thickness was   normal. Systolic function was normal. - Atrial septum: No defect or patent foramen ovale was identified   by color flow Doppler. - Tricuspid valve: There was mild regurgitation. - Pulmonary arteries: Systolic pressure was within the normal   range. PA peak pressure: 23 mm Hg (S).  CEREBRAL ANGIOGRAM 12/13/2017 IMPRESSION: Severe pre  occlusive 95% plus stenosis of the dominant left vertebrobasilar junction, and of 70% of the proximal basilar artery. Approximately 30% stenosis of the dominant left vertebral artery proximally. Approximately 50% stenosis of the non dominant right vertebral artery proximally. Right non dominant vertebral artery terminates in the right posterior-inferior cerebellar artery.  LEFT VERTEBROBASILAR ANGIOPLASTY PROXIMAL BASILAR ARTERY ANGIOPLASTY 12/18/2017 IMPRESSION: Status post endovascular revascularization of near complete occlusion of the dominant left vertebrobasilar junction, and of the proximal basilar artery with balloon angioplasty as described above achieving a 50-60% patency at both angioplasty sites.      ASSESSMENT: [name] is a 84 y.o. 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 Vascular risk factors include HTN and HLD.  Patient underwent left vertebrobasilar angioplasty and proximal basilar artery angioplasty with stent placement with Dr. Estanislado Pandy on 12/18/2017.  Patient is being seen today for hospital follow-up and overall is doing well without residual deficits or recurring of symptoms.    PLAN: -Continue aspirin 81 mg daily and Brilinta  and Lopid for secondary stroke prevention -Advised patient that he will continue taking both aspirin and Brilinta until advised otherwise by Dr. Estanislado Pandy -F/u with PCP regarding your HTN and HLD management -f/u with Dr. Estanislado Pandy for repeat CTA 3 to 4 months post angioplasty/stent placement procedure -Advised to do memory exercises for intermittent short-term memory concerns -Patient cleared to begin driving at this time but advised to have someone live with him for the first couple times and thereafter, drive short distances to places he is aware of, avoid main roads/highways and avoid nighttime driving  the -continue to monitor BP at home -advised to continue to stay active and maintain a healthy diet -Maintain strict control of hypertension with blood pressure goal below 130/90, diabetes with hemoglobin A1c goal below 6.5% and cholesterol with LDL cholesterol (bad cholesterol) goal below 70 mg/dL. I  also advised the patient to eat a healthy diet with plenty of whole grains, cereals, fruits and vegetables, exercise regularly and maintain ideal body weight.  Follow up in 3 months or call earlier if needed   Greater than 50% of time during this 25 minute visit was spent on counseling,explanation of diagnosis of bilateral cerebellar and right pontine infarcts, reviewing risk factor management of HLD and HTN, planning of further management, discussion with patient and family and coordination of care    Venancio Poisson, AGNP-BC  [facility] [address] [place], [place] [address]  Phone [phone] Fax [phone] Note: This document was prepared with digital dictation and possible smart phrase technology. Any transcriptional errors that result from this process are unintentional.

## 2018-04-10 ENCOUNTER — Telehealth (HOSPITAL_COMMUNITY): Payer: Self-pay

## 2018-04-10 NOTE — Telephone Encounter (Signed)
Called to schedule f/u cta, no answer, left vm. AW  

## 2018-06-05 ENCOUNTER — Telehealth: Payer: Self-pay

## 2018-06-05 ENCOUNTER — Ambulatory Visit: Payer: Medicare Other | Admitting: Adult Health

## 2018-06-05 NOTE — Telephone Encounter (Signed)
Patient no show for appt today. 

## 2018-06-05 NOTE — Progress Notes (Deleted)
[facility] [address] [place]. [place] [address] [phone]       OFFICE FOLLOW UP NOTE  [name] Date of Birth:  [birth year] Medical Record Number:  [id number]   Reason for Referral:  hospital stroke follow up  CHIEF COMPLAINT:  No chief complaint on file.   HPI:  Interval history 06/05/2018: Patient is being seen today for 12-month follow-up visit.  He continues on aspirin and Brilinta without side effects of bleeding or bruising.  Continues on Lopid without reported side effects.  Blood pressure today ***.   Visit 03/05/2018: Patient states overall he has been doing well without residual deficits or recurring of symptoms.  He does endorse slight short-term memory concerns since his stroke but has been greatly improving.  He has continued to take both the aspirin and Brilinta without side effects of bleeding or bruising.  Continues to take Lopid for HLD management.  Blood pressure today 107/55.  He is questioning whether he can begin driving again at this time.  He continues to live at home with his wife where they received 8 [employer]u-Med[employer] as far as this services twice weekly to assist with cleaning or any other needed assistance.  No further concerns at this time.  Denies new or worsening stroke/TIA symptoms.     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 History obtained from patient with interpreter assistance and chart review. Reviewed all radiology images and labs personally.  [name] is a 85 y.o. [name] with history of HTN and colon cancer who presented with intermittent L hemiparesis that worsens with dizziness for the past several weeks.  CT head reviewed and was negative for acute infarct.  CTA head and neck was negative for LVO but did show severe VB disease (LVA near  occlusion, hypoplastic right V4 occludes prior to the VBJ, BA narrow, PCAs perfused, robust right PCA, V1 arthrosclerosis) along with additional mild arthrosclerosis.  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  2D echo showed an EF of 60 to 65%.  LDL 49 and A1c 6.3.  Aspirin 81 PTA and recommended DAPT for 3 months and Plavix alone.  Discussion between Dr. Willaim Rayas and daughter regarding elective angioplasty/stenting with Dr. Estanislado Pandy the following week if stable or sooner if recurrent symptoms.  Therapies recommended home health PT and was discharged home with family in stable condition. Patient presented to [facility] on 12/18/2017 for cerebral angiogram with distal vertebrobasilar junction/proximal basilar artery stenosis angioplasty with Dr. Estanislado Pandy.  Procedure completed without major complications and recommended Brilinta 90 mg twice daily along with aspirin 81 mg daily and was discharged home with recommendations to follow-up with Dr. Estanislado Pandy in 2 weeks time.  Patient did follow-up in Dr. Arlean Hopping office on 01/07/2018 and per notes, continued to do well post procedure.  Recommended follow-up with CTA head/neck 3 to 4 months post procedure along with continuation of Brilinta 90 mg twice daily and aspirin 81 mg daily.    ROS:   14 system review of systems performed and negative with exception of constipation, runny nose and memory loss  PMH:  Past Medical History:  Diagnosis Date  . Adenocarcinoma ([place]) 1985   colon s/p right hemicolectomy and chemotherapy. F/U with cancer center and Dr. Lucia Gaskins rotinely  .  Depression   . Eczema   . Epistaxis    f/u per ENT  . Erectile dysfunction   . Gastric ulcer 12/11   H-Pylori Tx, EGD 3-12: gastritis  . Hay fever   . Head injury 11/2017   with fall  . Hyperlipemia   . Hypertension   . Hypothyroidism    Hypothyroid  . Insomnia   . Osteopenia      per DEXA 07-2008 (Rx fosamax)  . Rheumatoid arthritis ([place])   . Stroke [facility])     " balance issue"    PSH:  Past Surgical History:  Procedure Laterality Date  . COLON SURGERY     Right / Adenocarcinoma / Chemo  . COLONOSCOPY  2005, 2008   Dr. Lucia Gaskins  . INGUINAL HERNIA REPAIR     Right  . IR ANGIO INTRA EXTRACRAN SEL COM CAROTID INNOMINATE BILAT MOD SED  12/13/2017  . IR ANGIO VERTEBRAL SEL VERTEBRAL BILAT MOD SED  12/13/2017  . IR PTA INTRACRANIAL  12/18/2017  . RADIOLOGY WITH ANESTHESIA N/A 12/18/2017   Procedure: Angioplasty with stenting;  Surgeon: Luanne Bras, MD;  Location: [facility];  Service: Radiology;  Laterality: N/A;    Social History:  Social History   Socioeconomic History  . Marital status: Married    Spouse name: Not on file  . Number of children: 2  . Years of education: Not on file  . Highest education level: Not on file  Occupational History    Employer: RETIRED  Social Needs  . Financial resource strain: Not on file  . Food insecurity:    Worry: Not on file    Inability: Not on file  . Transportation needs:    Medical: Not on file    Non-medical: Not on file  Tobacco Use  . Smoking status: Never Smoker  . Smokeless tobacco: Never Used  Substance and Sexual Activity  . Alcohol use: No    Alcohol/week: 0.0 standard drinks  . Drug use: No  . Sexual activity: Not on file  Lifestyle  . Physical activity:    Days per week: Not on file    Minutes per session: Not on file  . Stress: Not on file  Relationships  . Social connections:    Talks on phone: Not on file    Gets together: Not on file    Attends religious service: Not on file    Active member of club or organization: Not on file    Attends meetings of clubs or organizations: Not on file    Relationship status: Not on file  . Intimate partner violence:    Fear of current or ex partner: Not on file    Emotionally abused: Not on file    Physically abused: Not on file    Forced sexual  activity: Not on file  Other Topics Concern  . Not on file  Social History Narrative   Original from [place]   Vegetarian   Daily caffeine use one per day    Family History:  Family History  Problem Relation Age of Onset  . Diabetes Neg Hx   . Heart attack Neg Hx   . Colon cancer Neg Hx   . Prostate cancer Neg Hx     Medications:   Current Outpatient Medications on File Prior to Visit  Medication Sig Dispense Refill  . aspirin 81 MG tablet Take 81 mg by mouth daily.      . fluticasone (FLONASE) 50 MCG/ACT nasal spray Place 2  sprays into the nose daily as needed for allergies.     Marland Kitchen gemfibrozil (LOPID) 600 MG tablet Take 0.5 tablets (300 mg total) by mouth 2 (two) times daily before a meal. 60 tablet 0  . gemfibrozil (LOPID) 600 MG tablet Take by mouth.    . levothyroxine (SYNTHROID, LEVOTHROID) 88 MCG tablet Take 88 mcg by mouth daily.    Marland Kitchen losartan-hydrochlorothiazide (HYZAAR) 50-12.5 MG per tablet TAKE ONE TABLET BY MOUTH ONE TIME DAILY 30 tablet 0  . methotrexate (RHEUMATREX) 2.5 MG tablet Take 6 tabs by mouth once a week.    . multivitamin (THERAGRAN) per tablet Take 1 tablet by mouth daily.      Marland Kitchen omeprazole (PRILOSEC) 40 MG capsule     . predniSONE (DELTASONE) 5 MG tablet Take 5 mg by mouth daily with breakfast.    . ticagrelor (BRILINTA) 90 MG TABS tablet Take 1 tablet (90 mg total) by mouth 2 (two) times daily. 60 tablet 3  . Vitamin D, Ergocalciferol, (DRISDOL) 50000 units CAPS capsule Take 50,000 Units by mouth once a week.     No current facility-administered medications on file prior to visit.     Allergies:   Allergies  Allergen Reactions  . Asa [Aspirin] Other (See Comments)    Gastric symptoms can tolerate the baby ASA but no more than that. Pt. Is taking ASA 81mg  now.  . Penicillin G Rash     Physical Exam  There were no vitals filed for this visit. There is no height or weight on file to calculate BMI. No exam data present  General: well developed,  well nourished, pleasant elderly male, seated, in no evident distress Head: head normocephalic and atraumatic.   Neck: supple with no carotid or supraclavicular bruits Cardiovascular: regular rate and rhythm, no murmurs Musculoskeletal: no deformity Skin:  no rash/petichiae Vascular:  Normal pulses all extremities  Neurologic Exam Mental Status: Awake and fully alert. Oriented to place and time. Recent and remote memory intact. Attention span, concentration and fund of knowledge appropriate. Mood and affect appropriate.  Cranial Nerves: Fundoscopic exam reveals sharp disc margins. Pupils equal, briskly reactive to light. Extraocular movements full without nystagmus. Visual fields full to confrontation. Hearing intact. Facial sensation intact. Face, tongue, palate moves normally and symmetrically.  Motor: Normal bulk and tone. Normal strength in all tested extremity muscles. Sensory.: intact to touch , pinprick , position and vibratory sensation.  Coordination: Rapid alternating movements normal in all extremities. Finger-to-nose and heel-to-shin performed accurately bilaterally. Gait and Station: Arises from chair without difficulty. Stance is normal. Gait demonstrates normal stride length and balance without assistance of cane. Able to heel, toe with mild difficulty but unable to tandem walk without difficulty.  Reflexes: 1+ and symmetric. Toes downgoing.    NIHSS  0 Modified Rankin  1    Diagnostic Data (Labs, Imaging, Testing)  CT HEAD WO CONTRAST 12/10/2017 IMPRESSION: No acute intracranial abnormality. Atrophy, chronic microvascular disease.  MR BRAIN WO CONTRAST 12/11/2017 IMPRESSION: 1. 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 No associated hemorrhage or mass effect. 2. Chronic supratentorial signal changes suggestive of small vessel disease have mildly progressed since 2014.  CT ANGIO HEAD W OR WO CONTRAST CT ANGIO NECK W OR  WO CONTRAST 12/11/2017 IMPRESSION: 1. Negative CTA for large vessel occlusion. 2. 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 Hypoplastic right V4 segment occludes prior to the vertebrobasilar junction. Basilar artery  diffusely narrowed with multifocal atheromatous irregularity. PCAs are well perfused, with predominant fetal type origin of the left PCA, with a fairly robust right posterior communicating artery as well. 3. Additional moderate to advanced stenoses involving the proximal V1 segments as above. 4. Additional mild for age atherosclerotic change about the carotid bifurcations and carotid siphons without hemodynamically significant stenosis.  ECHOCARDIOGRAM 12/11/2017 Study Conclusions - Left ventricle: The cavity size was normal. There was mild focal   basal hypertrophy of the septum. Systolic function was normal.   The estimated ejection fraction was in the range of 60% to 65%.   Wall motion was normal; there were no regional wall motion   abnormalities. Doppler parameters are consistent with abnormal   left ventricular relaxation (grade 1 diastolic dysfunction).   Doppler parameters are consistent with high ventricular filling   pressure. - Aortic valve: Transvalvular velocity was within the normal range.   There was no stenosis. There was mild regurgitation. Valve area   (VTI): 2.22 cm^2. Valve area (Vmean): 1.94 cm^2. - Mitral valve: Transvalvular velocity was within the normal range.   There was no evidence for stenosis. There was trivial   regurgitation. - Right ventricle: The cavity size was normal. Wall thickness was   normal. Systolic function was normal. - Atrial septum: No defect or patent foramen ovale was identified   by color flow Doppler. - Tricuspid valve: There was mild regurgitation. - Pulmonary arteries: Systolic pressure was within the normal   range. PA peak pressure: 23 mm  Hg (S).  CEREBRAL ANGIOGRAM 12/13/2017 IMPRESSION: Severe pre occlusive 95% plus stenosis of the dominant left vertebrobasilar junction, and of 70% of the proximal basilar artery. Approximately 30% stenosis of the dominant left vertebral artery proximally. Approximately 50% stenosis of the non dominant right vertebral artery proximally. Right non dominant vertebral artery terminates in the right posterior-inferior cerebellar artery.  LEFT VERTEBROBASILAR ANGIOPLASTY PROXIMAL BASILAR ARTERY ANGIOPLASTY 12/18/2017 IMPRESSION: Status post endovascular revascularization of near complete occlusion of the dominant left vertebrobasilar junction, and of the proximal basilar artery with balloon angioplasty as described above achieving a 50-60% patency at both angioplasty sites.      ASSESSMENT: [name] is a 85 y.o. 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 Vascular risk factors include HTN and HLD.  Patient underwent left vertebrobasilar angioplasty and proximal basilar artery angioplasty with stent placement with Dr. Estanislado Pandy on 12/18/2017.  Patient is being seen today for hospital follow-up and overall is doing well without residual deficits or recurring of symptoms.    PLAN: -Continue aspirin 81 mg daily and Brilinta  and Lopid for secondary stroke prevention -Advised patient that he will continue taking both aspirin and Brilinta until advised otherwise by Dr. Estanislado Pandy -F/u with PCP regarding your HTN and HLD management -f/u with Dr. Estanislado Pandy for repeat CTA 3 to 4 months post angioplasty/stent placement procedure -Advised to do memory exercises for intermittent short-term memory concerns -Patient cleared to begin driving at this time but advised to have someone live with him for the first couple times and thereafter, drive short distances to places he is  aware of, avoid main roads/highways and avoid nighttime driving the -continue to monitor BP at home -advised to continue to stay active and maintain a healthy diet -Maintain strict control of hypertension with blood pressure goal below 130/90, diabetes with hemoglobin A1c goal below 6.5% and cholesterol with LDL cholesterol (bad  cholesterol) goal below 70 mg/dL. I also advised the patient to eat a healthy diet with plenty of whole grains, cereals, fruits and vegetables, exercise regularly and maintain ideal body weight.  Follow up in 3 months or call earlier if needed   Greater than 50% of time during this 25 minute visit was spent on counseling,explanation of diagnosis of bilateral cerebellar and right pontine infarcts, reviewing risk factor management of HLD and HTN, planning of further management, discussion with patient and family and coordination of care    Venancio Poisson, AGNP-BC  [facility] [address] [place], [place] [address]  Phone [phone] Fax [phone] Note: This document was prepared with digital dictation and possible smart phrase technology. Any transcriptional errors that result from this process are unintentional.

## 2018-06-06 ENCOUNTER — Encounter: Payer: Self-pay | Admitting: Adult Health

## 2018-06-13 ENCOUNTER — Other Ambulatory Visit (HOSPITAL_COMMUNITY): Payer: Self-pay | Admitting: Interventional Radiology

## 2018-06-13 DIAGNOSIS — I771 Stricture of artery: Secondary | ICD-10-CM

## 2018-07-09 ENCOUNTER — Ambulatory Visit (HOSPITAL_COMMUNITY): Payer: Medicare Other

## 2018-07-09 ENCOUNTER — Other Ambulatory Visit (HOSPITAL_COMMUNITY): Payer: Self-pay | Admitting: Radiology

## 2018-07-09 ENCOUNTER — Ambulatory Visit (HOSPITAL_COMMUNITY)
Admission: RE | Admit: 2018-07-09 | Discharge: 2018-07-09 | Disposition: A | Discharge status: Home / Self Care | Payer: Medicare Other | Source: Ambulatory Visit | Attending: Interventional Radiology | Admitting: Interventional Radiology

## 2018-07-09 DIAGNOSIS — I771 Stricture of artery: Secondary | ICD-10-CM | POA: Insufficient documentation

## 2018-07-09 MED ORDER — IOPAMIDOL (ISOVUE-370) INJECTION 76%
75.0000 mL | Freq: Once | INTRAVENOUS | Status: AC | PRN
  Administered 2018-07-09: 75 mL via INTRAVENOUS

## 2018-07-09 MED ORDER — IOPAMIDOL (ISOVUE-370) INJECTION 76%
INTRAVENOUS | Status: AC
  Filled 2018-07-09: qty 100

## 2018-07-10 LAB — POCT I-STAT CREATININE: Creatinine, Ser: 1.2 mg/dL (ref 0.61–1.24)

## 2018-07-11 ENCOUNTER — Other Ambulatory Visit (HOSPITAL_COMMUNITY): Payer: Self-pay | Admitting: Interventional Radiology

## 2018-07-11 DIAGNOSIS — I771 Stricture of artery: Secondary | ICD-10-CM

## 2018-07-29 ENCOUNTER — Ambulatory Visit (HOSPITAL_COMMUNITY): Admission: RE | Admit: 2018-07-29 | Payer: Medicare Other | Source: Ambulatory Visit

## 2018-08-08 ENCOUNTER — Ambulatory Visit (HOSPITAL_COMMUNITY): Admission: RE | Admit: 2018-08-08 | Payer: Medicare Other | Source: Ambulatory Visit

## 2018-09-18 ENCOUNTER — Ambulatory Visit (HOSPITAL_COMMUNITY): Admission: RE | Admit: 2018-09-18 | Payer: Medicare Other | Source: Ambulatory Visit

## 2018-10-04 ENCOUNTER — Telehealth: Payer: Self-pay | Admitting: Oncology

## 2018-10-04 NOTE — Telephone Encounter (Signed)
Rescheduled appt per MD scheduling log.  Patient aware of new appt date and time.

## 2018-10-11 ENCOUNTER — Ambulatory Visit: Payer: Medicare Other | Admitting: Oncology

## 2018-10-25 ENCOUNTER — Inpatient Hospital Stay (HOSPITAL_COMMUNITY): Admission: RE | Admit: 2018-10-25 | Payer: Medicare Other | Source: Ambulatory Visit

## 2018-11-14 ENCOUNTER — Telehealth (HOSPITAL_COMMUNITY): Payer: Self-pay

## 2018-11-14 NOTE — Telephone Encounter (Signed)
Called to schedule consult, no answer, left vm. AW  

## 2018-12-05 ENCOUNTER — Telehealth (HOSPITAL_COMMUNITY): Payer: Self-pay

## 2018-12-05 NOTE — Telephone Encounter (Signed)
Called to reschedule consult, no answer, no vm. AW  

## 2018-12-10 ENCOUNTER — Inpatient Hospital Stay: Payer: Medicare Other | Attending: Oncology | Admitting: Oncology

## 2019-02-26 ENCOUNTER — Telehealth: Payer: Self-pay | Admitting: Adult Health

## 2019-02-26 NOTE — Telephone Encounter (Signed)
Pt daughter(on DPR) is calling stating she believes pt has been off of ticagrelor (BRILINTA) 90 MG TABS tablet since July.  This is concerning to her because she states this is his stroke medication.  Pt daughter is wanting to get him back on this as soon as possible.  Pt is asking for a call from RN to discuss.

## 2019-02-26 NOTE — Telephone Encounter (Signed)
LMVM for pts daughter, [name] that I returned call. I LMVM for [facility] with Dr. Anette Guarneri office to return call about pt.

## 2019-02-26 NOTE — Telephone Encounter (Signed)
I called pts daughter and made appt for pt 02-27-19 at 1045.  [name] daughter , speaks english and will be here for appt.  States pt not been on brilinta since 7/20. Had balloon vs stent by Dr. Estanislado Pandy and was told to see neurology yearly for brilinta refills.

## 2019-02-27 ENCOUNTER — Ambulatory Visit: Payer: Medicare Other | Admitting: Adult Health

## 2019-02-27 ENCOUNTER — Other Ambulatory Visit: Payer: Self-pay

## 2019-02-27 ENCOUNTER — Encounter: Payer: Self-pay | Admitting: Adult Health

## 2019-02-27 VITALS — BP 126/58 | HR 53 | Temp 97.9°F | Ht 61.0 in | Wt 131.8 lb

## 2019-02-27 DIAGNOSIS — E785 Hyperlipidemia, unspecified: Secondary | ICD-10-CM | POA: Diagnosis not present

## 2019-02-27 DIAGNOSIS — I10 Essential (primary) hypertension: Secondary | ICD-10-CM | POA: Diagnosis not present

## 2019-02-27 DIAGNOSIS — R413 Other amnesia: Secondary | ICD-10-CM

## 2019-02-27 DIAGNOSIS — I6322 Cerebral infarction due to unspecified occlusion or stenosis of basilar arteries: Secondary | ICD-10-CM

## 2019-02-27 DIAGNOSIS — I639 Cerebral infarction, unspecified: Secondary | ICD-10-CM

## 2019-02-27 NOTE — Patient Instructions (Addendum)
Continue to monitor memory loss and worsening cognitive function - can be evaluated further by PCP - could be impaired with thyroid function elevated  Schedule follow up visit with Dr. Anette Guarneri office to repeat imaging and needed use of brilinta in addition to aspirin 81mg  daily Office number [phone]  Continue aspirin 81 mg daily  and restart lopid  for secondary stroke prevention  Continue to follow up with PCP regarding cholesterol and blood pressure management   Continue to monitor blood pressure at home  Maintain strict control of hypertension with blood pressure goal below 130/90, diabetes with hemoglobin A1c goal below 6.5% and cholesterol with LDL cholesterol (bad cholesterol) goal below 70 mg/dL. I also advised the patient to eat a healthy diet with plenty of whole grains, cereals, fruits and vegetables, exercise regularly and maintain ideal body weight.         Thank you for coming to see [place] at [facility]. I hope we have been able to provide you high quality care today.  You may receive a patient satisfaction survey over the next few weeks. We would appreciate your feedback and comments so that we may continue to improve ourselves and the health of our patients.

## 2019-02-27 NOTE — Progress Notes (Signed)
[facility] [address] [place]. [place] [address] [phone]       OFFICE FOLLOW UP NOTE  [name] Date of Birth:  [birth year] Medical Record Number:  [id number]   Reason for visit: f/u stroke 11/2017  CHIEF COMPLAINT:  Chief Complaint  Patient presents with  . Follow-up    Yearly f/u. Interpreter and daughter present. Rm 9.    HPI: Stroke admission 12/10/2017: [name] is a 85 y.o. [name] with history of HTN and colon cancer who presented with intermittent L hemiparesis that worsens with dizziness for the past several weeks.  CT head reviewed and was negative for acute infarct.  CTA head and neck was negative for LVO but did show severe VB disease (LVA near occlusion, hypoplastic right V4 occludes prior to the VBJ, BA narrow, PCAs perfused, robust right PCA, V1 arthrosclerosis) along with additional mild arthrosclerosis.  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  2D echo showed an EF of 60 to 65%.  LDL 49 and A1c 6.3.  Aspirin 81 PTA and recommended DAPT for 3 months and Plavix alone.  Discussion between Dr. Willaim Rayas and daughter regarding elective angioplasty/stenting with Dr. Estanislado Pandy the following week if stable or sooner if recurrent symptoms.  Therapies recommended home health PT and was discharged home with family in stable condition. Patient presented to [facility] on 12/18/2017 for cerebral angiogram with distal vertebrobasilar junction/proximal basilar artery stenosis angioplasty with Dr. Estanislado Pandy.  Procedure completed without major complications and recommended Brilinta 90 mg twice daily along with aspirin 81 mg daily and was discharged home with recommendations to follow-up with Dr. Estanislado Pandy in 2 weeks time.  Patient did follow-up in Dr. Arlean Hopping office on 01/07/2018 and per notes, continued to do well post procedure.  Recommended  follow-up with CTA head/neck 3 to 4 months post procedure along with continuation of Brilinta 90 mg twice daily and aspirin 81 mg daily.  Initial visit 03/03/2018: Patient states overall he has been doing well without residual deficits or recurring of symptoms.  He does endorse slight short-term memory concerns since his stroke but has been greatly improving.  He has continued to take both the aspirin and Brilinta without side effects of bleeding or bruising.  Continues to take Lopid for HLD management.  Blood pressure today 107/55.  He is questioning whether he can begin driving again at this time.  He continues to live at home with his wife where they received aid services twice weekly to assist with cleaning or any other needed assistance.  No further concerns at this time.  Denies new or worsening stroke/TIA symptoms.  Update 02/27/2019: [name] is being seen today for stroke follow-up accompanied by his daughter and interpreter.  Recommended sooner follow-up after prior visit but no-show to scheduled visit.  He returns today and has been stable from a stroke standpoint.  Daughter endorses slow decline of memory such as being more forgetful and more issues with short-term memory.  He does not have issues with maintaining ADLs from a cognitive standpoint.  He continues to live with his wife.  Previously had aide services but due to COVID-19, they put the services on hold therefore daughter has been visiting more from [place], [place].  Daughter states that she reviewed his medication box which did not have Lopid or Brilinta.  Daughter believes these were last taken in July.  He has continued on  aspirin 81 mg without bleeding or bruising.  He has not had follow-up with vascular surgery despite multiple attempts from vascular surgery office to schedule visit per epic review regarding follow-up of angioplasty with stent placement in 11/2017.  PCP recently restarted Lopid for HLD management.  Lab work obtained which  showed elevated TSH therefore medications adjusted.  Blood pressure today 126/58.  No further concerns at this time.    ROS:   14 system review of systems performed and negative with exception of memory loss  PMH:  Past Medical History:  Diagnosis Date  . Adenocarcinoma ([place]) 1985   colon s/p right hemicolectomy and chemotherapy. F/U with cancer center and Dr. Lucia Gaskins rotinely  . Depression   . Eczema   . Epistaxis    f/u per ENT  . Erectile dysfunction   . Gastric ulcer 12/11   H-Pylori Tx, EGD 3-12: gastritis  . Hay fever   . Head injury 11/2017   with fall  . Hyperlipemia   . Hypertension   . Hypothyroidism    Hypothyroid  . Insomnia   . Osteopenia     per DEXA 07-2008 (Rx fosamax)  . Rheumatoid arthritis ([place])   . Stroke [facility])     " balance issue"    PSH:  Past Surgical History:  Procedure Laterality Date  . COLON SURGERY     Right / Adenocarcinoma / Chemo  . COLONOSCOPY  2005, 2008   Dr. Lucia Gaskins  . INGUINAL HERNIA REPAIR     Right  . IR ANGIO INTRA EXTRACRAN SEL COM CAROTID INNOMINATE BILAT MOD SED  12/13/2017  . IR ANGIO VERTEBRAL SEL VERTEBRAL BILAT MOD SED  12/13/2017  . IR PTA INTRACRANIAL  12/18/2017  . RADIOLOGY WITH ANESTHESIA N/A 12/18/2017   Procedure: Angioplasty with stenting;  Surgeon: Luanne Bras, MD;  Location: [place];  Service: Radiology;  Laterality: N/A;    Social History:  Social History   Socioeconomic History  . Marital status: Married    Spouse name: Not on file  . Number of children: 2  . Years of education: Not on file  . Highest education level: Not on file  Occupational History    Employer: RETIRED  Social Needs  . Financial resource strain: Not on file  . Food insecurity    Worry: Not on file    Inability: Not on file  . Transportation needs    Medical: Not on file    Non-medical: Not on file  Tobacco Use  . Smoking status: Never Smoker  . Smokeless tobacco: Never Used  Substance and Sexual Activity  . Alcohol use:  No    Alcohol/week: 0.0 standard drinks  . Drug use: No  . Sexual activity: Not on file  Lifestyle  . Physical activity    Days per week: Not on file    Minutes per session: Not on file  . Stress: Not on file  Relationships  . Social Herbalist on phone: Not on file    Gets together: Not on file    Attends religious service: Not on file    Active member of club or organization: Not on file    Attends meetings of clubs or organizations: Not on file    Relationship status: Not on file  . Intimate partner violence    Fear of current or ex partner: Not on file    Emotionally abused: Not on file    Physically abused: Not on file    Forced  sexual activity: Not on file  Other Topics Concern  . Not on file  Social History Narrative   Original from [place]   Vegetarian   Daily caffeine use one per day    Family History:  Family History  Problem Relation Age of Onset  . Diabetes Neg Hx   . Heart attack Neg Hx   . Colon cancer Neg Hx   . Prostate cancer Neg Hx     Medications:   Current Outpatient Medications on File Prior to Visit  Medication Sig Dispense Refill  . aspirin 81 MG tablet Take 81 mg by mouth daily.      . fluticasone (FLONASE) 50 MCG/ACT nasal spray Place 2 sprays into the nose daily as needed for allergies.     Marland Kitchen gemfibrozil (LOPID) 600 MG tablet Take 0.5 tablets (300 mg total) by mouth 2 (two) times daily before a meal. 60 tablet 0  . gemfibrozil (LOPID) 600 MG tablet Take by mouth.    . levothyroxine (SYNTHROID) 112 MCG tablet Take 112 mcg by mouth daily before breakfast.    . losartan-hydrochlorothiazide (HYZAAR) 50-12.5 MG per tablet TAKE ONE TABLET BY MOUTH ONE TIME DAILY 30 tablet 0  . methotrexate (RHEUMATREX) 2.5 MG tablet Take 6 tabs by mouth once a week.    . multivitamin (THERAGRAN) per tablet Take 1 tablet by mouth daily.      Marland Kitchen omeprazole (PRILOSEC) 40 MG capsule Take by mouth as needed.     . ticagrelor (BRILINTA) 90 MG TABS tablet Take  1 tablet (90 mg total) by mouth 2 (two) times daily. 60 tablet 3  . Vitamin D, Ergocalciferol, (DRISDOL) 50000 units CAPS capsule Take 50,000 Units by mouth once a week.     No current facility-administered medications on file prior to visit.     Allergies:   Allergies  Allergen Reactions  . Asa [Aspirin] Other (See Comments)    Gastric symptoms can tolerate the baby ASA but no more than that. Pt. Is taking ASA 81mg  now.  . Penicillin G Rash     Physical Exam  Vitals:   02/27/19 1100  BP: (!) 126/58  Pulse: (!) 53  Temp: 97.9 F (36.6 C)  TempSrc: Oral  Weight: 131 lb 12.8 oz (59.8 kg)  Height: 5\' 1"  (1.549 m)   Body mass index is 24.9 kg/m. No exam data present  General: well developed, well nourished, pleasant [place] elderly male, seated, in no evident distress Head: head normocephalic and atraumatic.   Neck: supple with no carotid or supraclavicular bruits Cardiovascular: regular rate and rhythm, no murmurs Musculoskeletal: no deformity Skin:  no rash/petichiae Vascular:  Normal pulses all extremities  Neurologic Exam Mental Status: Awake and fully alert. Oriented to place and time. Recent memory diminished and remote memory intact. Attention span, concentration and fund of knowledge appears appropriate but limited assessment due to language barrier and majority of history provided by daughter. Mood and affect appropriate.  Cranial Nerves: Pupils equal, briskly reactive to light. Extraocular movements full without nystagmus. Visual fields full to confrontation. Hearing intact. Facial sensation intact. Face, tongue, palate moves normally and symmetrically.  Motor: Normal bulk and tone. Normal strength in all tested extremity muscles. Sensory.: intact to touch , pinprick , position and vibratory sensation.  Coordination: Rapid alternating movements normal in all extremities. Finger-to-nose and heel-to-shin performed accurately bilaterally. Gait and Station: Arises  from chair with mild difficulty. Stance is normal. Gait demonstrates normal stride length and balance with assistance of cane Reflexes:  1+ and symmetric. Toes downgoing.       Diagnostic Data (Labs, Imaging, Testing)  CT HEAD WO CONTRAST 12/10/2017 IMPRESSION: No acute intracranial abnormality. Atrophy, chronic microvascular disease.  MR BRAIN WO CONTRAST 12/11/2017 IMPRESSION: 1. 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 No associated hemorrhage or mass effect. 2. Chronic supratentorial signal changes suggestive of small vessel disease have mildly progressed since 2014.  CT ANGIO HEAD W OR WO CONTRAST CT ANGIO NECK W OR WO CONTRAST 12/11/2017 IMPRESSION: 1. Negative CTA for large vessel occlusion. 2. 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 Hypoplastic right V4 segment occludes prior to the vertebrobasilar junction. Basilar artery diffusely narrowed with multifocal atheromatous irregularity. PCAs are well perfused, with predominant fetal type origin of the left PCA, with a fairly robust right posterior communicating artery as well. 3. Additional moderate to advanced stenoses involving the proximal V1 segments as above. 4. Additional mild for age atherosclerotic change about the carotid bifurcations and carotid siphons without hemodynamically significant stenosis.  ECHOCARDIOGRAM 12/11/2017 Study Conclusions - Left ventricle: The cavity size was normal. There was mild focal   basal hypertrophy of the septum. Systolic function was normal.   The estimated ejection fraction was in the range of 60% to 65%.   Wall motion was normal; there were no regional wall motion   abnormalities. Doppler parameters are consistent with abnormal   left ventricular relaxation (grade 1 diastolic dysfunction).   Doppler parameters are consistent with high ventricular  filling   pressure. - Aortic valve: Transvalvular velocity was within the normal range.   There was no stenosis. There was mild regurgitation. Valve area   (VTI): 2.22 cm^2. Valve area (Vmean): 1.94 cm^2. - Mitral valve: Transvalvular velocity was within the normal range.   There was no evidence for stenosis. There was trivial   regurgitation. - Right ventricle: The cavity size was normal. Wall thickness was   normal. Systolic function was normal. - Atrial septum: No defect or patent foramen ovale was identified   by color flow Doppler. - Tricuspid valve: There was mild regurgitation. - Pulmonary arteries: Systolic pressure was within the normal   range. PA peak pressure: 23 mm Hg (S).  CEREBRAL ANGIOGRAM 12/13/2017 IMPRESSION: Severe pre occlusive 95% plus stenosis of the dominant left vertebrobasilar junction, and of 70% of the proximal basilar artery. Approximately 30% stenosis of the dominant left vertebral artery proximally. Approximately 50% stenosis of the non dominant right vertebral artery proximally. Right non dominant vertebral artery terminates in the right posterior-inferior cerebellar artery.  LEFT VERTEBROBASILAR ANGIOPLASTY PROXIMAL BASILAR ARTERY ANGIOPLASTY 12/18/2017 IMPRESSION: Status post endovascular revascularization of near complete occlusion of the dominant left vertebrobasilar junction, and of the proximal basilar artery with balloon angioplasty as described above achieving a 50-60% patency at both angioplasty sites.      ASSESSMENT: [name] is a 85 y.o. 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 Vascular risk factors include HTN and HLD.  Patient underwent left vertebrobasilar angioplasty and proximal basilar artery angioplasty with stent placement with Dr. Estanislado Pandy on 12/18/2017.  Residual deficits of short-term memory loss  with slow decline per daughter but otherwise stable from a stroke standpoint.    PLAN: -Continue aspirin 81 mg daily  and Lopid for secondary stroke prevention -Provided office number to daughter to schedule follow-up with Dr. Estanislado Pandy  for needed follow-up imaging and evaluation.  Daughter requesting refill for Brilinta but this will be deferred to vascular surgery as surgery was performed over 1 year ago and determination for restarting will be decided by vascular surgery. Will also attempt to reach out to vascular surgery regarding needed follow-up visit -Memory loss likely multifactorial due to history of stroke, age related and elevated TSH.  Advised daughter to continue to monitor at this time and to encourage increased activity with routine exercise and memory exercises.  Advised to continue to follow with PCP for ongoing monitoring and management -F/u with PCP regarding your HTN and HLD management -continue to monitor BP at home -advised to continue to stay active and maintain a healthy diet -Maintain strict control of hypertension with blood pressure goal below 130/90, diabetes with hemoglobin A1c goal below 6.5% and cholesterol with LDL cholesterol (bad cholesterol) goal below 70 mg/dL. I also advised the patient to eat a healthy diet with plenty of whole grains, cereals, fruits and vegetables, exercise regularly and maintain ideal body weight.  Recommend follow-up as needed at this time   Greater than 50% of time during this 25 minute visit was spent on counseling,explanation of diagnosis of bilateral cerebellar and right pontine infarcts, indication for follow-up with vascular surgery, discussion regarding short-term memory loss, reviewing risk factor management of HLD and HTN, planning of further management, discussion with patient and family and coordination of care    Frann Rider, [facility]  [facility] [address] [place], [place] [address]   Phone [phone] Fax [phone] Note: This document was prepared with digital dictation and possible smart phrase technology. Any transcriptional errors that result from this process are unintentional.

## 2019-02-28 NOTE — Progress Notes (Signed)
I agree with the above plan 

## 2019-03-06 ENCOUNTER — Ambulatory Visit (HOSPITAL_COMMUNITY)
Admission: RE | Admit: 2019-03-06 | Discharge: 2019-03-06 | Disposition: A | Discharge status: Home / Self Care | Payer: Medicare Other | Source: Ambulatory Visit | Attending: Interventional Radiology | Admitting: Interventional Radiology

## 2019-03-06 ENCOUNTER — Other Ambulatory Visit: Payer: Self-pay

## 2019-03-06 DIAGNOSIS — I771 Stricture of artery: Secondary | ICD-10-CM
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
## 2019-03-09 ENCOUNTER — Inpatient Hospital Stay (HOSPITAL_COMMUNITY)
Admission: EM | Admit: 2019-03-09 | Discharge: 2019-03-11 | DRG: 084 | Disposition: A | Discharge status: Home Health | Payer: Medicare Other | Attending: Internal Medicine | Admitting: Internal Medicine

## 2019-03-09 ENCOUNTER — Emergency Department (HOSPITAL_COMMUNITY): Payer: Medicare Other

## 2019-03-09 ENCOUNTER — Encounter (HOSPITAL_COMMUNITY): Payer: Self-pay

## 2019-03-09 DIAGNOSIS — I609 Nontraumatic subarachnoid hemorrhage, unspecified: Secondary | ICD-10-CM

## 2019-03-09 DIAGNOSIS — Z85038 Personal history of other malignant neoplasm of large intestine: Secondary | ICD-10-CM

## 2019-03-09 DIAGNOSIS — M25511 Pain in right shoulder: Secondary | ICD-10-CM | POA: Diagnosis present

## 2019-03-09 DIAGNOSIS — Z8711 Personal history of peptic ulcer disease: Secondary | ICD-10-CM

## 2019-03-09 DIAGNOSIS — S065X9A Traumatic subdural hemorrhage with loss of consciousness of unspecified duration, initial encounter: Secondary | ICD-10-CM | POA: Diagnosis present

## 2019-03-09 DIAGNOSIS — E785 Hyperlipidemia, unspecified: Secondary | ICD-10-CM | POA: Diagnosis present

## 2019-03-09 DIAGNOSIS — F0390 Unspecified dementia without behavioral disturbance: Secondary | ICD-10-CM | POA: Diagnosis present

## 2019-03-09 DIAGNOSIS — I10 Essential (primary) hypertension: Secondary | ICD-10-CM | POA: Diagnosis present

## 2019-03-09 DIAGNOSIS — W1830XA Fall on same level, unspecified, initial encounter: Secondary | ICD-10-CM | POA: Diagnosis present

## 2019-03-09 DIAGNOSIS — S066X9A Traumatic subarachnoid hemorrhage with loss of consciousness of unspecified duration, initial encounter: Principal | ICD-10-CM | POA: Diagnosis present

## 2019-03-09 DIAGNOSIS — S0990XA Unspecified injury of head, initial encounter: Secondary | ICD-10-CM

## 2019-03-09 DIAGNOSIS — Z7982 Long term (current) use of aspirin: Secondary | ICD-10-CM

## 2019-03-09 DIAGNOSIS — Z20828 Contact with and (suspected) exposure to other viral communicable diseases: Secondary | ICD-10-CM | POA: Diagnosis present

## 2019-03-09 DIAGNOSIS — Z79899 Other long term (current) drug therapy: Secondary | ICD-10-CM

## 2019-03-09 DIAGNOSIS — R402362 Coma scale, best motor response, obeys commands, at arrival to emergency department: Secondary | ICD-10-CM | POA: Diagnosis present

## 2019-03-09 DIAGNOSIS — S0001XA Abrasion of scalp, initial encounter: Secondary | ICD-10-CM | POA: Diagnosis present

## 2019-03-09 DIAGNOSIS — Z886 Allergy status to analgesic agent status: Secondary | ICD-10-CM

## 2019-03-09 DIAGNOSIS — Z79890 Hormone replacement therapy (postmenopausal): Secondary | ICD-10-CM

## 2019-03-09 DIAGNOSIS — Z8673 Personal history of transient ischemic attack (TIA), and cerebral infarction without residual deficits: Secondary | ICD-10-CM

## 2019-03-09 DIAGNOSIS — E039 Hypothyroidism, unspecified: Secondary | ICD-10-CM | POA: Diagnosis present

## 2019-03-09 DIAGNOSIS — W19XXXA Unspecified fall, initial encounter: Secondary | ICD-10-CM

## 2019-03-09 DIAGNOSIS — I619 Nontraumatic intracerebral hemorrhage, unspecified: Secondary | ICD-10-CM | POA: Diagnosis present

## 2019-03-09 DIAGNOSIS — Z23 Encounter for immunization: Secondary | ICD-10-CM

## 2019-03-09 DIAGNOSIS — R402242 Coma scale, best verbal response, confused conversation, at arrival to emergency department: Secondary | ICD-10-CM | POA: Diagnosis present

## 2019-03-09 DIAGNOSIS — S0083XA Contusion of other part of head, initial encounter: Secondary | ICD-10-CM | POA: Diagnosis present

## 2019-03-09 DIAGNOSIS — S06360A Traumatic hemorrhage of cerebrum, unspecified, without loss of consciousness, initial encounter: Secondary | ICD-10-CM | POA: Diagnosis present

## 2019-03-09 DIAGNOSIS — M069 Rheumatoid arthritis, unspecified: Secondary | ICD-10-CM | POA: Diagnosis present

## 2019-03-09 DIAGNOSIS — Y92009 Unspecified place in unspecified non-institutional (private) residence as the place of occurrence of the external cause: Secondary | ICD-10-CM

## 2019-03-09 DIAGNOSIS — M8580 Other specified disorders of bone density and structure, unspecified site: Secondary | ICD-10-CM | POA: Diagnosis present

## 2019-03-09 DIAGNOSIS — R402142 Coma scale, eyes open, spontaneous, at arrival to emergency department: Secondary | ICD-10-CM | POA: Diagnosis present

## 2019-03-09 DIAGNOSIS — Z880 Allergy status to penicillin: Secondary | ICD-10-CM

## 2019-03-09 LAB — CBC
HCT: 31.6 % — ABNORMAL LOW (ref 39.0–52.0)
Hemoglobin: 10.6 g/dL — ABNORMAL LOW (ref 13.0–17.0)
MCH: 33.3 pg (ref 26.0–34.0)
MCHC: 33.5 g/dL (ref 30.0–36.0)
MCV: 99.4 fL (ref 80.0–100.0)
Platelets: 246 10*3/uL (ref 150–400)
RBC: 3.18 MIL/uL — ABNORMAL LOW (ref 4.22–5.81)
RDW: 14.6 % (ref 11.5–15.5)
WBC: 7.8 10*3/uL (ref 4.0–10.5)
nRBC: 0 % (ref 0.0–0.2)

## 2019-03-09 LAB — COMPREHENSIVE METABOLIC PANEL
ALT: 21 U/L (ref 0–44)
AST: 29 U/L (ref 15–41)
Albumin: 3.6 g/dL (ref 3.5–5.0)
Alkaline Phosphatase: 65 U/L (ref 38–126)
Anion gap: 11 (ref 5–15)
BUN: 23 mg/dL (ref 8–23)
CO2: 25 mmol/L (ref 22–32)
Calcium: 9.5 mg/dL (ref 8.9–10.3)
Chloride: 102 mmol/L (ref 98–111)
Creatinine, Ser: 1.29 mg/dL — ABNORMAL HIGH (ref 0.61–1.24)
GFR calc Af Amer: 57 mL/min — ABNORMAL LOW (ref >60)
GFR calc non Af Amer: 49 mL/min — ABNORMAL LOW (ref >60)
Glucose, Bld: 106 mg/dL — ABNORMAL HIGH (ref 70–99)
Potassium: 3.8 mmol/L (ref 3.5–5.1)
Sodium: 138 mmol/L (ref 135–145)
Total Bilirubin: 0.7 mg/dL (ref 0.3–1.2)
Total Protein: 6.9 g/dL (ref 6.5–8.1)

## 2019-03-09 LAB — PROTIME-INR
INR: 1 (ref 0.8–1.2)
Prothrombin Time: 12.7 seconds (ref 11.4–15.2)

## 2019-03-09 LAB — I-STAT CHEM 8, ED
BUN: 25 mg/dL — ABNORMAL HIGH (ref 8–23)
Calcium, Ion: 1.23 mmol/L (ref 1.15–1.40)
Chloride: 100 mmol/L (ref 98–111)
Creatinine, Ser: 1.3 mg/dL — ABNORMAL HIGH (ref 0.61–1.24)
Glucose, Bld: 96 mg/dL (ref 70–99)
HCT: 35 % — ABNORMAL LOW (ref 39.0–52.0)
Hemoglobin: 11.9 g/dL — ABNORMAL LOW (ref 13.0–17.0)
Potassium: 3.7 mmol/L (ref 3.5–5.1)
Sodium: 139 mmol/L (ref 135–145)
TCO2: 26 mmol/L (ref 22–32)

## 2019-03-09 LAB — URINALYSIS, ROUTINE W REFLEX MICROSCOPIC
Bilirubin Urine: NEGATIVE
Glucose, UA: NEGATIVE mg/dL
Hgb urine dipstick: NEGATIVE
Ketones, ur: NEGATIVE mg/dL
Leukocytes,Ua: NEGATIVE
Nitrite: NEGATIVE
Protein, ur: NEGATIVE mg/dL
Specific Gravity, Urine: 1.012 (ref 1.005–1.030)
pH: 6 (ref 5.0–8.0)

## 2019-03-09 LAB — SAMPLE TO BLOOD BANK

## 2019-03-09 LAB — CDS SEROLOGY

## 2019-03-09 LAB — ETHANOL: Alcohol, Ethyl (B): <10 mg/dL (ref <10)

## 2019-03-09 LAB — LACTIC ACID, PLASMA: Lactic Acid, Venous: 1.3 mmol/L (ref 0.5–1.9)

## 2019-03-09 MED ORDER — TETANUS-DIPHTH-ACELL PERTUSSIS 5-2.5-18.5 LF-MCG/0.5 IM SUSP
0.5000 mL | Freq: Once | INTRAMUSCULAR | Status: AC
  Administered 2019-03-09: 0.5 mL via INTRAMUSCULAR
  Filled 2019-03-09: qty 0.5

## 2019-03-09 MED ORDER — SODIUM CHLORIDE 0.9 % IV SOLN
0.3000 ug/kg | Freq: Once | INTRAVENOUS | Status: AC
  Administered 2019-03-09: 18 ug via INTRAVENOUS
  Filled 2019-03-09: qty 4.5

## 2019-03-09 NOTE — Consult Note (Addendum)
Reason for Consult: sdh Referring Physician: edp  [name] is an 85 y.o. male.   HPI:  85 year old male presents to the ED tonight after sustaining a fall and hitting his head on the kitchen cabinet.  He lives at home with his wife.  His daughter accompanies him tonight and is the historian.  The patient is [place] but he does understand and speak [place].  Patient denies any headaches nausea vomiting or vision changes.  He does report pain in his right shoulder which she did fall on.  He has difficulty moving his right arm because of the pain.  Patient is a patient of Dr. Estanislado Pandy and Dr. Leonie Man.  He had an angiogram done by Dr. Estanislado Pandy for stroke work-up and was placed on Brilinta.  He has an extensive medical history as listed below.  Daughter does report some confusion upon patient being asked specific questions  Past Medical History:  Diagnosis Date  . 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 F/U with cancer center and Dr. Lucia Gaskins rotinely  . Depression   . Eczema   . Epistaxis    f/u per ENT  . Erectile dysfunction   . Gastric ulcer 12/11   H-Pylori Tx, EGD 3-12: gastritis  . Hay fever   . Head injury 11/2017   with fall  . Hyperlipemia   . Hypertension   . Hypothyroidism    Hypothyroid  . Insomnia   . Osteopenia     per DEXA 07-2008 (Rx fosamax)  . Rheumatoid arthritis ([place])   . Stroke [facility])     " balance issue"    Past Surgical History:  Procedure Laterality Date  . COLON SURGERY     Right / Adenocarcinoma / Chemo  . COLONOSCOPY  2005, 2008   Dr. Lucia Gaskins  . INGUINAL HERNIA REPAIR     Right  . IR ANGIO INTRA EXTRACRAN SEL COM CAROTID INNOMINATE BILAT MOD SED  12/13/2017  . IR ANGIO VERTEBRAL SEL VERTEBRAL BILAT MOD SED  12/13/2017  . IR PTA INTRACRANIAL  12/18/2017  . RADIOLOGY WITH ANESTHESIA N/A 12/18/2017   Procedure: Angioplasty with stenting;  Surgeon: Luanne Bras, MD;  Location: [facility];  Service: Radiology;   Laterality: N/A;    Allergies  Allergen Reactions  . Asa [Aspirin] Other (See Comments)    Gastric symptoms can tolerate the baby ASA but no more than that. Pt. Is taking ASA 81mg  now.  . Penicillin G Rash    Did it involve swelling of the face/tongue/throat, SOB, or low BP? Unknown Did it involve sudden or severe rash/hives, skin peeling, or any reaction on the inside of your mouth or nose? Unknown Did you need to seek medical attention at a hospital or doctor's office? Unknown When did it last happen?unknown If all above answers are "NO", may proceed with cephalosporin use.    Social History   Tobacco Use  . Smoking status: Never Smoker  . Smokeless tobacco: Never Used  Substance Use Topics  . Alcohol use: No    Alcohol/week: 0.0 standard drinks    Family History  Problem Relation Age of Onset  . Diabetes Neg Hx   . Heart attack Neg Hx   . Colon cancer Neg Hx   . Prostate cancer Neg Hx      Review of Systems  Positive ROS: As above  All other systems have been reviewed and were otherwise negative with the exception of those mentioned in the HPI and as above.  Objective: Vital signs in last 24 hours: Temp:  [98.6 F (37 C)] 98.6 F (37 C) (10/18 1920) Pulse Rate:  [55-67] 58 (10/18 2200) Resp:  [15-23] 23 (10/18 2200) BP: (122-160)/(63-72) 158/65 (10/18 2200) SpO2:  [98 %-100 %] 99 % (10/18 2200)  General Appearance: Alert, cooperative, no distress, appears stated age Head: Normocephalic, without obvious abnormality, atraumatic Eyes: PERRL, conjunctiva/corneas clear, EOM's intact, fundi benign, both eyes      Neck: Aspen c-collar Lungs: respirations unlabored Heart: Regular rate and rhythm Pulses: 2+ and symmetric all extremities Skin: Skin color, texture, turgor normal, no rashes or lesions  NEUROLOGIC:   Mental status: A&O x2, no aphasia, good attention span, Memory and fund of knowledge Motor Exam - grossly normal, normal tone and bulk, right arm  strength difficult to exam because of pain in right shoulder Sensory Exam - grossly normal Reflexes: symmetric, no pathologic reflexes, No Hoffman's, No clonus Coordination - grossly normal Gait -unable to test Balance -able to test cranial Nerves: I: smell Not tested  II: visual acuity  OS: na    OD: na  II: visual fields Full to confrontation  II: pupils Equal, round, reactive to light  III,VII: ptosis None  III,IV,VI: extraocular muscles  Full ROM  V: mastication Normal  V: facial light touch sensation  Normal  V,VII: corneal reflex  Present  VII: facial muscle function - upper  Normal  VII: facial muscle function - lower Normal  VIII: hearing Not tested  IX: soft palate elevation  Normal  IX,X: gag reflex Present  XI: trapezius strength  5/5  XI: sternocleidomastoid strength 5/5  XI: neck flexion strength  5/5  XII: tongue strength  Normal    Data Review Lab Results  Component Value Date   WBC 7.8 03/09/2019   HGB 11.9 (L) 03/09/2019   HCT 35.0 (L) 03/09/2019   MCV 99.4 03/09/2019   PLT 246 03/09/2019   Lab Results  Component Value Date   NA 139 03/09/2019   K 3.7 03/09/2019   CL 100 03/09/2019   CO2 25 03/09/2019   BUN 25 (H) 03/09/2019   CREATININE 1.30 (H) 03/09/2019   GLUCOSE 96 03/09/2019   Lab Results  Component Value Date   INR 1.0 03/09/2019    Radiology: Ct Head Wo Contrast  Result Date: 03/09/2019 CLINICAL DATA:  85 year old male with headache, neck pain and facial pain and swelling following fall. Initial encounter. EXAM: CT HEAD WITHOUT CONTRAST CT MAXILLOFACIAL WITHOUT CONTRAST CT CERVICAL SPINE WITHOUT CONTRAST TECHNIQUE: Multidetector CT imaging of the head, cervical spine, and maxillofacial structures were performed using the standard protocol without intravenous contrast. Multiplanar CT image reconstructions of the cervical spine and maxillofacial structures were also generated. COMPARISON:  None. FINDINGS: CT HEAD FINDINGS Brain: A 1.2 cm  area of hemorrhage along septum pellucidum, a small amount of hemorrhage in the LEFT occipital horn and subarachnoid hemorrhage in the RIGHT aspect of the quadrigeminal cistern noted. Atrophy and chronic small-vessel white matter ischemic changes are present. There is no evidence of midline shift,, acute infarct, hydrocephalus or subdural/epidural hemorrhage. Vascular: Carotid and vertebral atherosclerotic calcifications noted Skull: Normal. Negative for fracture or focal lesion. Other: None. CT MAXILLOFACIAL FINDINGS Osseous: No fracture or mandibular dislocation. No destructive process. Orbits: Negative. No traumatic or inflammatory finding. Sinuses: Small amount of fluid within the LEFT maxillary sinus is noted. Soft tissues: RIGHT facial soft tissue swelling is noted. CT CERVICAL SPINE FINDINGS Alignment: Normal. Skull base and vertebrae: No acute fracture. No primary  bone lesion or focal pathologic process. Soft tissues and spinal canal: No prevertebral fluid or swelling. No visible canal hematoma. Disc levels: Mild multilevel degenerative disc disease/spondylosis and moderate multilevel facet arthropathy noted. Upper chest: No acute abnormality Other: None IMPRESSION: 1. 1.2 cm area of hemorrhage along the septum pellucidum, small amount of hemorrhage in the LEFT occipital horn and subarachnoid hemorrhage in the RIGHT aspect of the quadrigeminal cistern. 2. Atrophy and chronic small-vessel white matter ischemic changes. 3. RIGHT facial soft tissue swelling without fracture. 4. No static evidence of acute injury to the cervical spine. Critical Value/emergent results were called by telephone at the time of interpretation on 03/09/2019 at 9:43 pm to providerDAVID YAO , who verbally acknowledged these results. Electronically Signed   By: Margarette Canada M.D.   On: 03/09/2019 21:45   Ct Cervical Spine Wo Contrast  Result Date: 03/09/2019 CLINICAL DATA:  85 year old male with headache, neck pain and facial pain and  swelling following fall. Initial encounter. EXAM: CT HEAD WITHOUT CONTRAST CT MAXILLOFACIAL WITHOUT CONTRAST CT CERVICAL SPINE WITHOUT CONTRAST TECHNIQUE: Multidetector CT imaging of the head, cervical spine, and maxillofacial structures were performed using the standard protocol without intravenous contrast. Multiplanar CT image reconstructions of the cervical spine and maxillofacial structures were also generated. COMPARISON:  None. FINDINGS: CT HEAD FINDINGS Brain: A 1.2 cm area of hemorrhage along septum pellucidum, a small amount of hemorrhage in the LEFT occipital horn and subarachnoid hemorrhage in the RIGHT aspect of the quadrigeminal cistern noted. Atrophy and chronic small-vessel white matter ischemic changes are present. There is no evidence of midline shift,, acute infarct, hydrocephalus or subdural/epidural hemorrhage. Vascular: Carotid and vertebral atherosclerotic calcifications noted Skull: Normal. Negative for fracture or focal lesion. Other: None. CT MAXILLOFACIAL FINDINGS Osseous: No fracture or mandibular dislocation. No destructive process. Orbits: Negative. No traumatic or inflammatory finding. Sinuses: Small amount of fluid within the LEFT maxillary sinus is noted. Soft tissues: RIGHT facial soft tissue swelling is noted. CT CERVICAL SPINE FINDINGS Alignment: Normal. Skull base and vertebrae: No acute fracture. No primary bone lesion or focal pathologic process. Soft tissues and spinal canal: No prevertebral fluid or swelling. No visible canal hematoma. Disc levels: Mild multilevel degenerative disc disease/spondylosis and moderate multilevel facet arthropathy noted. Upper chest: No acute abnormality Other: None IMPRESSION: 1. 1.2 cm area of hemorrhage along the septum pellucidum, small amount of hemorrhage in the LEFT occipital horn and subarachnoid hemorrhage in the RIGHT aspect of the quadrigeminal cistern. 2. Atrophy and chronic small-vessel white matter ischemic changes. 3. RIGHT facial  soft tissue swelling without fracture. 4. No static evidence of acute injury to the cervical spine. Critical Value/emergent results were called by telephone at the time of interpretation on 03/09/2019 at 9:43 pm to providerDAVID YAO , who verbally acknowledged these results. Electronically Signed   By: Margarette Canada M.D.   On: 03/09/2019 21:45   Dg Pelvis Portable  Result Date: 03/09/2019 CLINICAL DATA:  Initial evaluation for acute trauma, fall. EXAM: PORTABLE PELVIS 1-2 VIEWS COMPARISON:  None. FINDINGS: No acute fracture or dislocation. No pubic diastasis. SI joints approximated. Femoral heads normally position within the acetabula. Mild osteoarthritic changes about the hips bilaterally. Degenerative spondylolysis noted within the visualized lower lumbar spine. No visible soft tissue injury.  Hernia tacks overlie the pelvis. IMPRESSION: No acute osseous abnormality about the pelvis. Electronically Signed   By: Jeannine Boga M.D.   On: 03/09/2019 21:09   Dg Chest Port 1 View  Result Date: 03/09/2019 CLINICAL DATA:  Initial evaluation for acute trauma. EXAM: PORTABLE CHEST 1 VIEW COMPARISON:  Prior radiograph from 10/05/2015. FINDINGS: Allowing for AP technique, cardiac and mediastinal silhouettes are stable in size and contour, and remain within normal limits. Aortic atherosclerosis. Lungs are hypoinflated. Secondary mild diffuse bronchovascular crowding. Mild blunting of the left costophrenic angle suggestive of a small left pleural effusion. Associated mild left basilar subsegmental atelectasis. No other focal airspace disease. No pulmonary edema. No pneumothorax. No acute osseous finding. Degenerative changes noted about the shoulders bilaterally. IMPRESSION: 1. Possible small left pleural effusion with associated mild left basilar atelectasis. 2. No other active cardiopulmonary disease. 3.  Aortic Atherosclerosis (ICD10-I70.0). Electronically Signed   By: Jeannine Boga M.D.   On:  03/09/2019 21:06   Dg Shoulder Right Portable  Result Date: 03/09/2019 CLINICAL DATA:  Initial evaluation for acute trauma, fall. EXAM: PORTABLE RIGHT SHOULDER COMPARISON:  None. FINDINGS: No acute fracture or dislocation. Humeral head in normal alignment within the glenoid. AC joint approximated. Mild osteoarthritic changes noted about the right AC joint. No periarticular calcification. Visualized right hemithorax clear. IMPRESSION: No acute osseous abnormality about the right shoulder. Electronically Signed   By: Jeannine Boga M.D.   On: 03/09/2019 21:11   Dg Humerus Right  Result Date: 03/09/2019 CLINICAL DATA:  Initial evaluation for acute trauma, fall. EXAM: RIGHT HUMERUS - 2+ VIEW COMPARISON:  None. FINDINGS: There is no evidence of fracture or other focal bone lesions. Soft tissues are unremarkable. IMPRESSION: No acute osseous abnormality about the right humerus. Electronically Signed   By: Jeannine Boga M.D.   On: 03/09/2019 21:08   Ct Maxillofacial Wo Contrast  Result Date: 03/09/2019 CLINICAL DATA:  85 year old male with headache, neck pain and facial pain and swelling following fall. Initial encounter. EXAM: CT HEAD WITHOUT CONTRAST CT MAXILLOFACIAL WITHOUT CONTRAST CT CERVICAL SPINE WITHOUT CONTRAST TECHNIQUE: Multidetector CT imaging of the head, cervical spine, and maxillofacial structures were performed using the standard protocol without intravenous contrast. Multiplanar CT image reconstructions of the cervical spine and maxillofacial structures were also generated. COMPARISON:  None. FINDINGS: CT HEAD FINDINGS Brain: A 1.2 cm area of hemorrhage along septum pellucidum, a small amount of hemorrhage in the LEFT occipital horn and subarachnoid hemorrhage in the RIGHT aspect of the quadrigeminal cistern noted. Atrophy and chronic small-vessel white matter ischemic changes are present. There is no evidence of midline shift,, acute infarct, hydrocephalus or subdural/epidural  hemorrhage. Vascular: Carotid and vertebral atherosclerotic calcifications noted Skull: Normal. Negative for fracture or focal lesion. Other: None. CT MAXILLOFACIAL FINDINGS Osseous: No fracture or mandibular dislocation. No destructive process. Orbits: Negative. No traumatic or inflammatory finding. Sinuses: Small amount of fluid within the LEFT maxillary sinus is noted. Soft tissues: RIGHT facial soft tissue swelling is noted. CT CERVICAL SPINE FINDINGS Alignment: Normal. Skull base and vertebrae: No acute fracture. No primary bone lesion or focal pathologic process. Soft tissues and spinal canal: No prevertebral fluid or swelling. No visible canal hematoma. Disc levels: Mild multilevel degenerative disc disease/spondylosis and moderate multilevel facet arthropathy noted. Upper chest: No acute abnormality Other: None IMPRESSION: 1. 1.2 cm area of hemorrhage along the septum pellucidum, small amount of hemorrhage in the LEFT occipital horn and subarachnoid hemorrhage in the RIGHT aspect of the quadrigeminal cistern. 2. Atrophy and chronic small-vessel white matter ischemic changes. 3. RIGHT facial soft tissue swelling without fracture. 4. No static evidence of acute injury to the cervical spine. Critical Value/emergent results were called by telephone at the time of interpretation on 03/09/2019 at  9:43 pm to providerDAVID YAO , who verbally acknowledged these results. Electronically Signed   By: Margarette Canada M.D.   On: 03/09/2019 21:45   Assessment/Plan: 85 year old male with extensive medical history presented to the ED tonight after sustaining a fall and hitting his head on the kitchen counter.  CT head shows 1.2 cm area of hemorrhage along the septum pellucidum, small hemorrhage in the left occipital horn of his left lateral ventricle, and subarachnoid hemorrhage in the right aspect of the quadrigeminal cistern.  Would recommend reversal of Brilinta.  Would appreciate medicine admitting to stepdown for  observation and frequent neuro checks and repeat head CT in the morning.  Given his extensive medical history and being that he is on Brilinta I do not recommend any surgical intervention at this time.  We did discuss plan of care and how aggressive family would want to be if patient's neurological status declined.  Daughter states that she will discuss this with her siblings.    Ocie Cornfield Meyran 03/09/2019 10:52 PM   Agree with above

## 2019-03-09 NOTE — ED Triage Notes (Signed)
Per [facility], pt from home w/ a c/o fall with right sided head injury sustained. Pt found on carpeted ground with his head inside a cabinet. LSN unknown. 911 called @ 1802. LOC unknown. Normally CAOx4 but currently alert to self only. Pt on blood thinners. Right arm drift noted. Following "some" commands.   170/80 HR 72 CBG 136 GCS 13

## 2019-03-09 NOTE — ED Provider Notes (Signed)
[facility] EMERGENCY DEPARTMENT Provider Note   CSN: [id number] Arrival date & time: 03/09/19  1910     History   Chief Complaint Chief Complaint  Patient presents with   Fall   Head Injury    HPI [name] is a 85 y.o. male.     HPI  The patient is an 85 year old male with a hx of HLD and HTN and prior CVA who presents to the ED after a fall. He cannot remember the details of the fall. He takes Engineer, building services. He hit the right side of his head on a kitchen cabinet. (+) LOC. He currently endorses a headache but no vision changes. No nausea or vomiting. He does endorse some pain in his right shoulder at rest and with movement.   Past Medical History:  Diagnosis Date   Adenocarcinoma ([place]) 1985   colon s/p right hemicolectomy and chemotherapy. F/U with cancer center and Dr. Lucia Gaskins rotinely   Depression    Eczema    Epistaxis    f/u per ENT   Erectile dysfunction    Gastric ulcer 12/11   H-Pylori Tx, EGD 3-12: gastritis   Hay fever    Head injury 11/2017   with fall   Hyperlipemia    Hypertension    Hypothyroidism    Hypothyroid   Insomnia    Osteopenia     per DEXA 07-2008 (Rx fosamax)   Rheumatoid arthritis ([place])    Stroke ([place])     " balance issue"    Patient Active Problem List   Diagnosis Date Noted   ICH (intracerebral hemorrhage) ([place]) 03/10/2019   Basilar artery stenosis with infarction ([place]) 12/18/2017   TIA (transient ischemic attack) 12/10/2017   Rheumatoid arthritis ([place])    Chest pain 06/02/2011   General medical examination 04/11/2011   PEPTIC ULCER DISEASE, HELICOBACTER PYLORI POSITIVE 07/12/2010   PERSONAL HISTORY MALIG NEOPLASM LARGE INTESTINE 05/18/2010   MITRAL VALVE DISORDERS 04/05/2009   HOARSENESS 12/04/2008   OSTEOPENIA 08/26/2008   HYPERTRIGLYCERIDEMIA 07/23/2008   EPISTAXIS 07/26/2007   ERECTILE DYSFUNCTION 02/22/2007   ECZEMA 12/07/2006   LEG CRAMPS 10/09/2006   Hypothyroidism  08/28/2006   Essential hypertension 08/28/2006   HAY FEVER 08/28/2006   INSOMNIA 08/28/2006    Past Surgical History:  Procedure Laterality Date   COLON SURGERY     Right / Adenocarcinoma / Chemo   COLONOSCOPY  2005, 2008   Dr. Lucia Gaskins   INGUINAL HERNIA REPAIR     Right   IR ANGIO INTRA EXTRACRAN SEL COM CAROTID INNOMINATE BILAT MOD SED  12/13/2017   IR ANGIO VERTEBRAL SEL VERTEBRAL BILAT MOD SED  12/13/2017   IR PTA INTRACRANIAL  12/18/2017   RADIOLOGY WITH ANESTHESIA N/A 12/18/2017   Procedure: Angioplasty with stenting;  Surgeon: Luanne Bras, MD;  Location: [facility];  Service: Radiology;  Laterality: N/A;        Home Medications    Prior to Admission medications   Medication Sig Start Date End Date Taking? Authorizing Provider  aspirin EC 81 MG tablet Take 81 mg by mouth every morning.   Yes [provider]  Dextran 70-Hypromellose, PF, ([employer] NATURAL TEARS) 0.1-0.3 % SOLN Place 1 drop into both eyes at bedtime.   Yes [provider]  fluticasone (FLONASE) 50 MCG/ACT nasal spray Place 2 sprays into the nose daily as needed (seasonal allergies).    Yes [provider]  folic acid (FOLVITE) 1 MG tablet Take 1 mg by mouth every morning.  Yes [provider]  gemfibrozil (LOPID) 600 MG tablet Take 0.5 tablets (300 mg total) by mouth 2 (two) times daily before a meal. 12/12/17  Yes Regalado, Belkys A, MD  Glucosamine HCl (GLUCOSAMINE PO) Take 1 tablet by mouth every morning.   Yes [provider]  levothyroxine (SYNTHROID) 112 MCG tablet Take 112 mcg by mouth daily before breakfast. 02/17/19  Yes [provider]  losartan-hydrochlorothiazide (HYZAAR) 50-12.5 MG per tablet TAKE ONE TABLET BY MOUTH ONE TIME DAILY Patient taking differently: Take 1 tablet by mouth every morning.  02/23/12  Yes Paz, Alda Berthold, MD  methotrexate (RHEUMATREX) 2.5 MG tablet Take 15 mg by mouth every Saturday.  07/13/17  Yes [provider]   Multiple Vitamin (MULTIVITAMIN WITH MINERALS) TABS tablet Take 1 tablet by mouth every morning.   Yes [provider]  omeprazole (PRILOSEC OTC) 20 MG tablet Take 10 mg by mouth every morning.   Yes [provider]  ticagrelor (BRILINTA) 90 MG TABS tablet Take 1 tablet (90 mg total) by mouth 2 (two) times daily. 12/19/17  Yes Louk, Alexandra M, PA-C  Vitamin D, Ergocalciferol, (DRISDOL) 50000 units CAPS capsule Take 50,000 Units by mouth every Saturday.  08/25/16  Yes [provider]    Family History Family History  Problem Relation Age of Onset   Diabetes Neg Hx    Heart attack Neg Hx    Colon cancer Neg Hx    Prostate cancer Neg Hx     Social History Social History   Tobacco Use   Smoking status: Never Smoker   Smokeless tobacco: Never Used  Substance Use Topics   Alcohol use: No    Alcohol/week: 0.0 standard drinks   Drug use: No     Allergies   Asa [aspirin] and Penicillin g   Review of Systems Review of Systems  Constitutional: Negative for chills and fever.  HENT: Negative for ear pain and sore throat.   Eyes: Negative for visual disturbance.  Respiratory: Negative for cough and shortness of breath.   Cardiovascular: Negative for chest pain and palpitations.  Gastrointestinal: Negative for abdominal pain, nausea and vomiting.  Genitourinary: Negative for dysuria and hematuria.  Musculoskeletal: Positive for arthralgias. Negative for back pain and neck pain.  Skin: Positive for wound. Negative for color change and rash.  Neurological: Positive for syncope and headaches. Negative for seizures.  All other systems reviewed and are negative.    Physical Exam Updated Vital Signs BP 131/61 (BP Location: Left Arm)    Pulse (!) 59    Temp 98.7 F (37.1 C) (Oral)    Resp 17    SpO2 96%   Physical Exam Vitals signs and nursing note reviewed.  Constitutional:      Appearance: He is well-developed.     Comments: GCS 15, ABC intact   HENT:     Head: Normocephalic.     Comments: Abrasion to the right forehead with an underlying hematoma noted Eyes:     Conjunctiva/sclera: Conjunctivae normal.  Neck:     Musculoskeletal: Neck supple.     Comments: C-collar in place, no midline cervical TTP Cardiovascular:     Rate and Rhythm: Normal rate and regular rhythm.  Pulmonary:     Effort: Pulmonary effort is normal. No respiratory distress.     Breath sounds: Normal breath sounds.  Abdominal:     Palpations: Abdomen is soft.     Tenderness: There is no abdominal tenderness.  Musculoskeletal:  Comments: No midline thoracic or lumbar TTP. Right shoulder TTP with decreased ROM without obvious deformity  Skin:    General: Skin is warm and dry.  Neurological:     Mental Status: He is alert.     Comments: CN II-XII grossly intact. 5/5 strength all four extremities. Sensation intact to light touch. No dysmetria or tremor.      ED Treatments / Results  Labs (all labs ordered are listed, but only abnormal results are displayed) Labs Reviewed  SURGICAL PCR SCREEN - Abnormal; Notable for the following components:      Result Value   Staphylococcus aureus POSITIVE (*)    All other components within normal limits  COMPREHENSIVE METABOLIC PANEL - Abnormal; Notable for the following components:   Glucose, Bld 106 (*)    Creatinine, Ser 1.29 (*)    GFR calc non Af Amer 49 (*)    GFR calc Af Amer 57 (*)    All other components within normal limits  CBC - Abnormal; Notable for the following components:   RBC 3.18 (*)    Hemoglobin 10.6 (*)    HCT 31.6 (*)    All other components within normal limits  URINALYSIS, ROUTINE W REFLEX MICROSCOPIC - Abnormal; Notable for the following components:   Color, Urine STRAW (*)    All other components within normal limits  BASIC METABOLIC PANEL - Abnormal; Notable for the following components:   Glucose, Bld 106 (*)    GFR calc non Af Amer 52 (*)    GFR calc Af Amer 60 (*)    All  other components within normal limits  I-STAT CHEM 8, ED - Abnormal; Notable for the following components:   BUN 25 (*)    Creatinine, Ser 1.30 (*)    Hemoglobin 11.9 (*)    HCT 35.0 (*)    All other components within normal limits  SARS CORONAVIRUS 2 (TAT 6-24 HRS)  CDS SEROLOGY  ETHANOL  LACTIC ACID, PLASMA  PROTIME-INR  SAMPLE TO BLOOD BANK    EKG EKG Interpretation  Date/Time:  Sunday March 09 2019 19:11:05 EDT Ventricular Rate:  68 PR Interval:    QRS Duration: 97 QT Interval:  405 QTC Calculation: 431 R Axis:   -16 Text Interpretation:  Sinus rhythm Borderline left axis deviation Borderline T abnormalities, lateral leads No significant change since last tracing Confirmed by Wandra Arthurs [phone]) on 03/09/2019 8:04:27 PM   Radiology Ct Head Wo Contrast  Result Date: 03/10/2019 CLINICAL DATA:  Follow-up subdural hemorrhage EXAM: CT HEAD WITHOUT CONTRAST TECHNIQUE: Contiguous axial images were obtained from the base of the skull through the vertex without intravenous contrast. COMPARISON:  Yesterday FINDINGS: Brain: Unchanged subarachnoid hemorrhage in the right more than left quadrigeminal cistern. 16 mm hemorrhage along the anterior septum pellucidum, unchanged. Hemorrhage rightward of the more posterior septum pellucidum and layering in the occipital horns of the lateral ventricles has mildly increased. No ventricular obstruction. Generalized atrophy and chronic ischemic injury. Vascular: Atherosclerotic calcification Skull: Negative for fracture. Sinuses/Orbits: Bilateral cataract resection. Chronic left maxillary sinusitis. IMPRESSION: 1. Mild increase in intraventricular hemorrhage without ventricular obstruction. 2. Stable quadrigeminal cistern subarachnoid hemorrhage Electronically Signed   By: Monte Fantasia M.D.   On: 03/10/2019 07:38   Ct Head Wo Contrast  Result Date: 03/09/2019 CLINICAL DATA:  85 year old male with headache, neck pain and facial pain and  swelling following fall. Initial encounter. EXAM: CT HEAD WITHOUT CONTRAST CT MAXILLOFACIAL WITHOUT CONTRAST CT CERVICAL SPINE WITHOUT CONTRAST TECHNIQUE:  Multidetector CT imaging of the head, cervical spine, and maxillofacial structures were performed using the standard protocol without intravenous contrast. Multiplanar CT image reconstructions of the cervical spine and maxillofacial structures were also generated. COMPARISON:  None. FINDINGS: CT HEAD FINDINGS Brain: A 1.2 cm area of hemorrhage along septum pellucidum, a small amount of hemorrhage in the LEFT occipital horn and subarachnoid hemorrhage in the RIGHT aspect of the quadrigeminal cistern noted. Atrophy and chronic small-vessel white matter ischemic changes are present. There is no evidence of midline shift,, acute infarct, hydrocephalus or subdural/epidural hemorrhage. Vascular: Carotid and vertebral atherosclerotic calcifications noted Skull: Normal. Negative for fracture or focal lesion. Other: None. CT MAXILLOFACIAL FINDINGS Osseous: No fracture or mandibular dislocation. No destructive process. Orbits: Negative. No traumatic or inflammatory finding. Sinuses: Small amount of fluid within the LEFT maxillary sinus is noted. Soft tissues: RIGHT facial soft tissue swelling is noted. CT CERVICAL SPINE FINDINGS Alignment: Normal. Skull base and vertebrae: No acute fracture. No primary bone lesion or focal pathologic process. Soft tissues and spinal canal: No prevertebral fluid or swelling. No visible canal hematoma. Disc levels: Mild multilevel degenerative disc disease/spondylosis and moderate multilevel facet arthropathy noted. Upper chest: No acute abnormality Other: None IMPRESSION: 1. 1.2 cm area of hemorrhage along the septum pellucidum, small amount of hemorrhage in the LEFT occipital horn and subarachnoid hemorrhage in the RIGHT aspect of the quadrigeminal cistern. 2. Atrophy and chronic small-vessel white matter ischemic changes. 3. RIGHT facial  soft tissue swelling without fracture. 4. No static evidence of acute injury to the cervical spine. Critical Value/emergent results were called by telephone at the time of interpretation on 03/09/2019 at 9:43 pm to providerDAVID YAO , who verbally acknowledged these results. Electronically Signed   By: Margarette Canada M.D.   On: 03/09/2019 21:45   Ct Cervical Spine Wo Contrast  Result Date: 03/09/2019 CLINICAL DATA:  85 year old male with headache, neck pain and facial pain and swelling following fall. Initial encounter. EXAM: CT HEAD WITHOUT CONTRAST CT MAXILLOFACIAL WITHOUT CONTRAST CT CERVICAL SPINE WITHOUT CONTRAST TECHNIQUE: Multidetector CT imaging of the head, cervical spine, and maxillofacial structures were performed using the standard protocol without intravenous contrast. Multiplanar CT image reconstructions of the cervical spine and maxillofacial structures were also generated. COMPARISON:  None. FINDINGS: CT HEAD FINDINGS Brain: A 1.2 cm area of hemorrhage along septum pellucidum, a small amount of hemorrhage in the LEFT occipital horn and subarachnoid hemorrhage in the RIGHT aspect of the quadrigeminal cistern noted. Atrophy and chronic small-vessel white matter ischemic changes are present. There is no evidence of midline shift,, acute infarct, hydrocephalus or subdural/epidural hemorrhage. Vascular: Carotid and vertebral atherosclerotic calcifications noted Skull: Normal. Negative for fracture or focal lesion. Other: None. CT MAXILLOFACIAL FINDINGS Osseous: No fracture or mandibular dislocation. No destructive process. Orbits: Negative. No traumatic or inflammatory finding. Sinuses: Small amount of fluid within the LEFT maxillary sinus is noted. Soft tissues: RIGHT facial soft tissue swelling is noted. CT CERVICAL SPINE FINDINGS Alignment: Normal. Skull base and vertebrae: No acute fracture. No primary bone lesion or focal pathologic process. Soft tissues and spinal canal: No prevertebral fluid or  swelling. No visible canal hematoma. Disc levels: Mild multilevel degenerative disc disease/spondylosis and moderate multilevel facet arthropathy noted. Upper chest: No acute abnormality Other: None IMPRESSION: 1. 1.2 cm area of hemorrhage along the septum pellucidum, small amount of hemorrhage in the LEFT occipital horn and subarachnoid hemorrhage in the RIGHT aspect of the quadrigeminal cistern. 2. Atrophy and chronic small-vessel white matter ischemic  changes. 3. RIGHT facial soft tissue swelling without fracture. 4. No static evidence of acute injury to the cervical spine. Critical Value/emergent results were called by telephone at the time of interpretation on 03/09/2019 at 9:43 pm to providerDAVID YAO , who verbally acknowledged these results. Electronically Signed   By: Margarette Canada M.D.   On: 03/09/2019 21:45   Dg Pelvis Portable  Result Date: 03/09/2019 CLINICAL DATA:  Initial evaluation for acute trauma, fall. EXAM: PORTABLE PELVIS 1-2 VIEWS COMPARISON:  None. FINDINGS: No acute fracture or dislocation. No pubic diastasis. SI joints approximated. Femoral heads normally position within the acetabula. Mild osteoarthritic changes about the hips bilaterally. Degenerative spondylolysis noted within the visualized lower lumbar spine. No visible soft tissue injury.  Hernia tacks overlie the pelvis. IMPRESSION: No acute osseous abnormality about the pelvis. Electronically Signed   By: Jeannine Boga M.D.   On: 03/09/2019 21:09   Dg Chest Port 1 View  Result Date: 03/09/2019 CLINICAL DATA:  Initial evaluation for acute trauma. EXAM: PORTABLE CHEST 1 VIEW COMPARISON:  Prior radiograph from 10/05/2015. FINDINGS: Allowing for AP technique, cardiac and mediastinal silhouettes are stable in size and contour, and remain within normal limits. Aortic atherosclerosis. Lungs are hypoinflated. Secondary mild diffuse bronchovascular crowding. Mild blunting of the left costophrenic angle suggestive of a small  left pleural effusion. Associated mild left basilar subsegmental atelectasis. No other focal airspace disease. No pulmonary edema. No pneumothorax. No acute osseous finding. Degenerative changes noted about the shoulders bilaterally. IMPRESSION: 1. Possible small left pleural effusion with associated mild left basilar atelectasis. 2. No other active cardiopulmonary disease. 3.  Aortic Atherosclerosis (ICD10-I70.0). Electronically Signed   By: Jeannine Boga M.D.   On: 03/09/2019 21:06   Dg Shoulder Right Portable  Result Date: 03/09/2019 CLINICAL DATA:  Initial evaluation for acute trauma, fall. EXAM: PORTABLE RIGHT SHOULDER COMPARISON:  None. FINDINGS: No acute fracture or dislocation. Humeral head in normal alignment within the glenoid. AC joint approximated. Mild osteoarthritic changes noted about the right AC joint. No periarticular calcification. Visualized right hemithorax clear. IMPRESSION: No acute osseous abnormality about the right shoulder. Electronically Signed   By: Jeannine Boga M.D.   On: 03/09/2019 21:11   Dg Humerus Right  Result Date: 03/09/2019 CLINICAL DATA:  Initial evaluation for acute trauma, fall. EXAM: RIGHT HUMERUS - 2+ VIEW COMPARISON:  None. FINDINGS: There is no evidence of fracture or other focal bone lesions. Soft tissues are unremarkable. IMPRESSION: No acute osseous abnormality about the right humerus. Electronically Signed   By: Jeannine Boga M.D.   On: 03/09/2019 21:08   Ct Maxillofacial Wo Contrast  Result Date: 03/09/2019 CLINICAL DATA:  85 year old male with headache, neck pain and facial pain and swelling following fall. Initial encounter. EXAM: CT HEAD WITHOUT CONTRAST CT MAXILLOFACIAL WITHOUT CONTRAST CT CERVICAL SPINE WITHOUT CONTRAST TECHNIQUE: Multidetector CT imaging of the head, cervical spine, and maxillofacial structures were performed using the standard protocol without intravenous contrast. Multiplanar CT image reconstructions of  the cervical spine and maxillofacial structures were also generated. COMPARISON:  None. FINDINGS: CT HEAD FINDINGS Brain: A 1.2 cm area of hemorrhage along septum pellucidum, a small amount of hemorrhage in the LEFT occipital horn and subarachnoid hemorrhage in the RIGHT aspect of the quadrigeminal cistern noted. Atrophy and chronic small-vessel white matter ischemic changes are present. There is no evidence of midline shift,, acute infarct, hydrocephalus or subdural/epidural hemorrhage. Vascular: Carotid and vertebral atherosclerotic calcifications noted Skull: Normal. Negative for fracture or focal lesion. Other: None. CT  MAXILLOFACIAL FINDINGS Osseous: No fracture or mandibular dislocation. No destructive process. Orbits: Negative. No traumatic or inflammatory finding. Sinuses: Small amount of fluid within the LEFT maxillary sinus is noted. Soft tissues: RIGHT facial soft tissue swelling is noted. CT CERVICAL SPINE FINDINGS Alignment: Normal. Skull base and vertebrae: No acute fracture. No primary bone lesion or focal pathologic process. Soft tissues and spinal canal: No prevertebral fluid or swelling. No visible canal hematoma. Disc levels: Mild multilevel degenerative disc disease/spondylosis and moderate multilevel facet arthropathy noted. Upper chest: No acute abnormality Other: None IMPRESSION: 1. 1.2 cm area of hemorrhage along the septum pellucidum, small amount of hemorrhage in the LEFT occipital horn and subarachnoid hemorrhage in the RIGHT aspect of the quadrigeminal cistern. 2. Atrophy and chronic small-vessel white matter ischemic changes. 3. RIGHT facial soft tissue swelling without fracture. 4. No static evidence of acute injury to the cervical spine. Critical Value/emergent results were called by telephone at the time of interpretation on 03/09/2019 at 9:43 pm to providerDAVID YAO , who verbally acknowledged these results. Electronically Signed   By: Margarette Canada M.D.   On: 03/09/2019 21:45     Procedures Procedures (including critical care time)  Medications Ordered in ED Medications  polyvinyl alcohol (LIQUIFILM TEARS) 1.4 % ophthalmic solution 1 drop (1 drop Both Eyes Given 03/10/19 0425)  fluticasone (FLONASE) 50 MCG/ACT nasal spray 2 spray (has no administration in time range)   stroke: mapping our early stages of recovery book (has no administration in time range)  acetaminophen (TYLENOL) tablet 650 mg (has no administration in time range)    Or  acetaminophen (TYLENOL) solution 650 mg (has no administration in time range)    Or  acetaminophen (TYLENOL) suppository 650 mg (has no administration in time range)  senna-docusate (Senokot-S) tablet 1 tablet (1 tablet Oral Not Given 03/10/19 0928)  pantoprazole (PROTONIX) injection 40 mg (40 mg Intravenous Given 03/10/19 0152)  levothyroxine (SYNTHROID, LEVOTHROID) injection 56 mcg (56 mcg Intravenous Given 03/10/19 0928)  labetalol (NORMODYNE) injection 5-10 mg (has no administration in time range)  ondansetron (ZOFRAN) injection 4 mg (has no administration in time range)  Tdap (BOOSTRIX) injection 0.5 mL (0.5 mLs Intramuscular Given 03/09/19 2158)  desmopressin (DDAVP) 18 mcg in sodium chloride 0.9 % 50 mL IVPB (0 mcg/kg  59.8 kg Intravenous Stopped 03/10/19 0024)     Initial Impression / Assessment and Plan / ED Course  I have reviewed the triage vital signs and the nursing notes.  Pertinent labs & imaging results that were available during my care of the patient were reviewed by me and considered in my medical decision making (see chart for details).        The patient is an 85 year old male who sustained a fall earlier this evening on [place]. He was on Brillinta following a stroke workup. He endorses (+) LOC. Unclear mechanism as the patient is amnestic to the event. He had a normal neurologic exam and was GCS15, ABC intact on arrival. His daughter did endorse some confusion compared to his baseline.  CT  Head, C-spine, and Face were ordered and significant for a 1.2cm area of hemorrhage along the septum pellucidum, with small hemorrhage in the right aspect of the quadreminal cistern.   Dr. Saintclair Halsted of neurosurgery consulted, recommended DDAVP and admission for observation and neurochecks, repeat CT Head in the AM.   XR of the R shoulder and humerus negative for fracture or dislocation.   Dr. Alcario Drought consulted for admission to hospitalist medicine.  Final Clinical Impressions(s) / ED Diagnoses   Final diagnoses:  Injury of head, initial encounter  Fall in home, initial encounter  SAH (subarachnoid hemorrhage) [facility])    ED Discharge Orders    None       Regan Lemming, MD 03/10/19 1306    Drenda Freeze, MD 03/11/19 [phone]

## 2019-03-10 ENCOUNTER — Inpatient Hospital Stay (HOSPITAL_COMMUNITY): Payer: Medicare Other

## 2019-03-10 ENCOUNTER — Encounter (HOSPITAL_COMMUNITY): Payer: Self-pay

## 2019-03-10 DIAGNOSIS — E785 Hyperlipidemia, unspecified: Secondary | ICD-10-CM | POA: Diagnosis present

## 2019-03-10 DIAGNOSIS — M069 Rheumatoid arthritis, unspecified: Secondary | ICD-10-CM

## 2019-03-10 DIAGNOSIS — Z8673 Personal history of transient ischemic attack (TIA), and cerebral infarction without residual deficits: Secondary | ICD-10-CM | POA: Diagnosis not present

## 2019-03-10 DIAGNOSIS — Z886 Allergy status to analgesic agent status: Secondary | ICD-10-CM | POA: Diagnosis not present

## 2019-03-10 DIAGNOSIS — S06360A Traumatic hemorrhage of cerebrum, unspecified, without loss of consciousness, initial encounter: Secondary | ICD-10-CM | POA: Diagnosis not present

## 2019-03-10 DIAGNOSIS — M25511 Pain in right shoulder: Secondary | ICD-10-CM | POA: Diagnosis present

## 2019-03-10 DIAGNOSIS — Z880 Allergy status to penicillin: Secondary | ICD-10-CM | POA: Diagnosis not present

## 2019-03-10 DIAGNOSIS — Z79899 Other long term (current) drug therapy: Secondary | ICD-10-CM | POA: Diagnosis not present

## 2019-03-10 DIAGNOSIS — Z85038 Personal history of other malignant neoplasm of large intestine: Secondary | ICD-10-CM | POA: Diagnosis not present

## 2019-03-10 DIAGNOSIS — F0390 Unspecified dementia without behavioral disturbance: Secondary | ICD-10-CM | POA: Diagnosis present

## 2019-03-10 DIAGNOSIS — R402362 Coma scale, best motor response, obeys commands, at arrival to emergency department: Secondary | ICD-10-CM | POA: Diagnosis present

## 2019-03-10 DIAGNOSIS — Y92009 Unspecified place in unspecified non-institutional (private) residence as the place of occurrence of the external cause: Secondary | ICD-10-CM

## 2019-03-10 DIAGNOSIS — I10 Essential (primary) hypertension: Secondary | ICD-10-CM

## 2019-03-10 DIAGNOSIS — Z79890 Hormone replacement therapy (postmenopausal): Secondary | ICD-10-CM | POA: Diagnosis not present

## 2019-03-10 DIAGNOSIS — Z20828 Contact with and (suspected) exposure to other viral communicable diseases: Secondary | ICD-10-CM | POA: Diagnosis present

## 2019-03-10 DIAGNOSIS — S0083XA Contusion of other part of head, initial encounter: Secondary | ICD-10-CM | POA: Diagnosis present

## 2019-03-10 DIAGNOSIS — W1830XA Fall on same level, unspecified, initial encounter: Secondary | ICD-10-CM | POA: Diagnosis present

## 2019-03-10 DIAGNOSIS — Z23 Encounter for immunization: Secondary | ICD-10-CM | POA: Diagnosis present

## 2019-03-10 DIAGNOSIS — W19XXXA Unspecified fall, initial encounter: Secondary | ICD-10-CM

## 2019-03-10 DIAGNOSIS — Z7982 Long term (current) use of aspirin: Secondary | ICD-10-CM | POA: Diagnosis not present

## 2019-03-10 DIAGNOSIS — E039 Hypothyroidism, unspecified: Secondary | ICD-10-CM

## 2019-03-10 DIAGNOSIS — S065X9A Traumatic subdural hemorrhage with loss of consciousness of unspecified duration, initial encounter: Secondary | ICD-10-CM | POA: Diagnosis present

## 2019-03-10 DIAGNOSIS — S066X9A Traumatic subarachnoid hemorrhage with loss of consciousness of unspecified duration, initial encounter: Secondary | ICD-10-CM | POA: Diagnosis present

## 2019-03-10 DIAGNOSIS — M8580 Other specified disorders of bone density and structure, unspecified site: Secondary | ICD-10-CM | POA: Diagnosis present

## 2019-03-10 DIAGNOSIS — S0001XA Abrasion of scalp, initial encounter: Secondary | ICD-10-CM | POA: Diagnosis present

## 2019-03-10 DIAGNOSIS — R402142 Coma scale, eyes open, spontaneous, at arrival to emergency department: Secondary | ICD-10-CM | POA: Diagnosis present

## 2019-03-10 DIAGNOSIS — Z8711 Personal history of peptic ulcer disease: Secondary | ICD-10-CM | POA: Diagnosis not present

## 2019-03-10 DIAGNOSIS — R402242 Coma scale, best verbal response, confused conversation, at arrival to emergency department: Secondary | ICD-10-CM | POA: Diagnosis present

## 2019-03-10 DIAGNOSIS — I609 Nontraumatic subarachnoid hemorrhage, unspecified: Secondary | ICD-10-CM | POA: Diagnosis present

## 2019-03-10 DIAGNOSIS — I619 Nontraumatic intracerebral hemorrhage, unspecified: Secondary | ICD-10-CM | POA: Diagnosis present

## 2019-03-10 LAB — BASIC METABOLIC PANEL
Anion gap: 12 (ref 5–15)
BUN: 20 mg/dL (ref 8–23)
CO2: 24 mmol/L (ref 22–32)
Calcium: 9 mg/dL (ref 8.9–10.3)
Chloride: 102 mmol/L (ref 98–111)
Creatinine, Ser: 1.24 mg/dL (ref 0.61–1.24)
GFR calc Af Amer: 60 mL/min — ABNORMAL LOW (ref >60)
GFR calc non Af Amer: 52 mL/min — ABNORMAL LOW (ref >60)
Glucose, Bld: 106 mg/dL — ABNORMAL HIGH (ref 70–99)
Potassium: 3.7 mmol/L (ref 3.5–5.1)
Sodium: 138 mmol/L (ref 135–145)

## 2019-03-10 LAB — SURGICAL PCR SCREEN
MRSA, PCR: NEGATIVE
Staphylococcus aureus: POSITIVE — AB

## 2019-03-10 LAB — SARS CORONAVIRUS 2 (TAT 6-24 HRS): SARS Coronavirus 2: NEGATIVE

## 2019-03-10 MED ORDER — ACETAMINOPHEN 650 MG RE SUPP: 650.0000 mg | RECTAL | Status: DC | PRN

## 2019-03-10 MED ORDER — STROKE: EARLY STAGES OF RECOVERY BOOK: Freq: Once | Status: DC

## 2019-03-10 MED ORDER — LEVOTHYROXINE SODIUM 112 MCG PO TABS
112.0000 ug | ORAL_TABLET | Freq: Every day | ORAL | Status: DC
  Administered 2019-03-11: 112 ug via ORAL
  Filled 2019-03-10: qty 1

## 2019-03-10 MED ORDER — OMEPRAZOLE MAGNESIUM 20 MG PO TBEC: 10.0000 mg | DELAYED_RELEASE_TABLET | Freq: Every morning | ORAL | Status: DC

## 2019-03-10 MED ORDER — ONDANSETRON HCL 4 MG/2ML IJ SOLN: 4.0000 mg | Freq: Four times a day (QID) | INTRAMUSCULAR | Status: DC | PRN

## 2019-03-10 MED ORDER — SENNOSIDES-DOCUSATE SODIUM 8.6-50 MG PO TABS
1.0000 | ORAL_TABLET | Freq: Two times a day (BID) | ORAL | Status: DC
  Administered 2019-03-10 – 2019-03-11 (×2): 1 via ORAL
  Filled 2019-03-10 (×2): qty 1

## 2019-03-10 MED ORDER — LEVOTHYROXINE SODIUM 100 MCG/5ML IV SOLN
56.0000 ug | Freq: Every day | INTRAVENOUS | Status: DC
  Administered 2019-03-10: 56 ug via INTRAVENOUS
  Filled 2019-03-10: qty 5

## 2019-03-10 MED ORDER — POLYVINYL ALCOHOL 1.4 % OP SOLN
1.0000 [drp] | Freq: Every day | OPHTHALMIC | Status: DC
  Administered 2019-03-10 (×2): 1 [drp] via OPHTHALMIC
  Filled 2019-03-10 (×2): qty 15

## 2019-03-10 MED ORDER — GEMFIBROZIL 600 MG PO TABS
300.0000 mg | ORAL_TABLET | Freq: Two times a day (BID) | ORAL | Status: DC
  Administered 2019-03-10 – 2019-03-11 (×2): 300 mg via ORAL
  Filled 2019-03-10 (×4): qty 0.5

## 2019-03-10 MED ORDER — GLUCOSAMINE 500 MG PO CAPS: ORAL_CAPSULE | Freq: Every morning | ORAL | Status: DC

## 2019-03-10 MED ORDER — ADULT MULTIVITAMIN W/MINERALS CH
1.0000 | ORAL_TABLET | Freq: Every morning | ORAL | Status: DC
  Administered 2019-03-10 – 2019-03-11 (×2): 1 via ORAL
  Filled 2019-03-10 (×2): qty 1

## 2019-03-10 MED ORDER — FOLIC ACID 1 MG PO TABS
1.0000 mg | ORAL_TABLET | Freq: Every morning | ORAL | Status: DC
  Administered 2019-03-11: 1 mg via ORAL
  Filled 2019-03-10: qty 1

## 2019-03-10 MED ORDER — ACETAMINOPHEN 160 MG/5ML PO SOLN: 650.0000 mg | ORAL | Status: DC | PRN

## 2019-03-10 MED ORDER — ACETAMINOPHEN 325 MG PO TABS
650.0000 mg | ORAL_TABLET | ORAL | Status: DC | PRN
  Administered 2019-03-10: 650 mg via ORAL
  Filled 2019-03-10: qty 2

## 2019-03-10 MED ORDER — LABETALOL HCL 5 MG/ML IV SOLN: 5.0000 mg | INTRAVENOUS | Status: DC | PRN

## 2019-03-10 MED ORDER — PANTOPRAZOLE SODIUM 40 MG IV SOLR
40.0000 mg | Freq: Every day | INTRAVENOUS | Status: DC
  Administered 2019-03-10 (×2): 40 mg via INTRAVENOUS
  Filled 2019-03-10 (×2): qty 40

## 2019-03-10 MED ORDER — FLUTICASONE PROPIONATE 50 MCG/ACT NA SUSP: 2.0000 | Freq: Every day | NASAL | Status: DC | PRN
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
## 2019-03-10 NOTE — H&P (Signed)
History and Physical    [name] [id number] DOB: 28-Nov-1928 DOA: 03/09/2019  PCP: Kristopher Glee., MD  Patient coming from: Home  I have personally briefly reviewed patient's old medical records in [facility]  Chief Complaint: Fall  HPI: [name] is a 85 y.o. male with medical history significant of RA, prior stroke on brilinta, HTN.  Patient presents to the ED following a fall at home.  Hit head on kitchen cabinet.  Lives at home with wife.  Daughter accompanies patient.  Patient denies any headaches nausea vomiting or vision changes.  Pain in R shoulder, difficulty moving arm due to pain.   Daughter does report some confusion upon patient being asked specific questions   ED Course: CT head shows 1.2 cm area of hemorrhage along the septum pellucidum, small hemorrhage in the left occipital horn of his left lateral ventricle, and subarachnoid hemorrhage in the right aspect of the quadrigeminal cistern.  X ray of R shoulder, neg for fracture.  NS saw patient, recs in chart, requested medicine to admit.   Review of Systems: As per HPI, otherwise all review of systems negative.  Past Medical History:  Diagnosis Date   Adenocarcinoma ([place]) 1985   colon s/p right hemicolectomy and chemotherapy. F/U with cancer center and Dr. Lucia Gaskins rotinely   Depression    Eczema    Epistaxis    f/u per ENT   Erectile dysfunction    Gastric ulcer 12/11   H-Pylori Tx, EGD 3-12: gastritis   Hay fever    Head injury 11/2017   with fall   Hyperlipemia    Hypertension    Hypothyroidism    Hypothyroid   Insomnia    Osteopenia     per DEXA 07-2008 (Rx fosamax)   Rheumatoid arthritis ([place])    Stroke ([place])     " balance issue"    Past Surgical History:  Procedure Laterality Date   COLON SURGERY     Right / Adenocarcinoma / Chemo   COLONOSCOPY  2005, 2008   Dr. Lucia Gaskins   INGUINAL HERNIA REPAIR     Right   IR ANGIO INTRA EXTRACRAN SEL COM CAROTID  INNOMINATE BILAT MOD SED  12/13/2017   IR ANGIO VERTEBRAL SEL VERTEBRAL BILAT MOD SED  12/13/2017   IR PTA INTRACRANIAL  12/18/2017   RADIOLOGY WITH ANESTHESIA N/A 12/18/2017   Procedure: Angioplasty with stenting;  Surgeon: Luanne Bras, MD;  Location: [facility];  Service: Radiology;  Laterality: N/A;     reports that he has never smoked. He has never used smokeless tobacco. He reports that he does not drink alcohol or use drugs.  Allergies  Allergen Reactions   Asa [Aspirin] Other (See Comments)    Gastric symptoms can tolerate the baby ASA but no more than that. Pt. Is taking ASA 81mg  now.   Penicillin G Rash    Did it involve swelling of the face/tongue/throat, SOB, or low BP? Unknown Did it involve sudden or severe rash/hives, skin peeling, or any reaction on the inside of your mouth or nose? Unknown Did you need to seek medical attention at a hospital or doctor's office? Unknown When did it last happen?unknown If all above answers are NO, may proceed with cephalosporin use.    Family History  Problem Relation Age of Onset   Diabetes Neg Hx    Heart attack Neg Hx    Colon cancer Neg Hx    Prostate cancer Neg Hx      Prior  to Admission medications   Medication Sig Start Date End Date Taking? Authorizing Provider  aspirin EC 81 MG tablet Take 81 mg by mouth every morning.   Yes [provider]  Dextran 70-Hypromellose, PF, ([employer] NATURAL TEARS) 0.1-0.3 % SOLN Place 1 drop into both eyes at bedtime.   Yes [provider]  fluticasone (FLONASE) 50 MCG/ACT nasal spray Place 2 sprays into the nose daily as needed (seasonal allergies).    Yes [provider]  folic acid (FOLVITE) 1 MG tablet Take 1 mg by mouth every morning.   Yes [provider]  gemfibrozil (LOPID) 600 MG tablet Take 0.5 tablets (300 mg total) by mouth 2 (two) times daily before a meal. 12/12/17  Yes Regalado, Belkys A, MD  Glucosamine HCl (GLUCOSAMINE PO) Take 1  tablet by mouth every morning.   Yes [provider]  levothyroxine (SYNTHROID) 112 MCG tablet Take 112 mcg by mouth daily before breakfast. 02/17/19  Yes [provider]  losartan-hydrochlorothiazide (HYZAAR) 50-12.5 MG per tablet TAKE ONE TABLET BY MOUTH ONE TIME DAILY Patient taking differently: Take 1 tablet by mouth every morning.  02/23/12  Yes Paz, Alda Berthold, MD  methotrexate (RHEUMATREX) 2.5 MG tablet Take 15 mg by mouth every Saturday.  07/13/17  Yes [provider]  Multiple Vitamin (MULTIVITAMIN WITH MINERALS) TABS tablet Take 1 tablet by mouth every morning.   Yes [provider]  omeprazole (PRILOSEC OTC) 20 MG tablet Take 10 mg by mouth every morning.   Yes [provider]  ticagrelor (BRILINTA) 90 MG TABS tablet Take 1 tablet (90 mg total) by mouth 2 (two) times daily. 12/19/17  Yes Louk, Alexandra M, PA-C  Vitamin D, Ergocalciferol, (DRISDOL) 50000 units CAPS capsule Take 50,000 Units by mouth every Saturday.  08/25/16  Yes [provider]    Physical Exam: Vitals:   03/09/19 2300 03/09/19 2315 03/09/19 2330 03/09/19 2345  BP: (!) 145/61 (!) 156/68 (!) 151/68 (!) 148/68  Pulse: (!) 55 65 61 60  Resp: 16 17 17 16   Temp:      TempSrc:      SpO2: 97% 98% 99% 98%    Constitutional: NAD, calm, comfortable Eyes: PERRL, lids and conjunctivae normal ENMT: Mucous membranes are moist. Posterior pharynx clear of any exudate or lesions.Normal dentition.  Neck: normal, supple, no masses, no thyromegaly Respiratory: clear to auscultation bilaterally, no wheezing, no crackles. Normal respiratory effort. No accessory muscle use.  Cardiovascular: Regular rate and rhythm, no murmurs / rubs / gallops. No extremity edema. 2+ pedal pulses. No carotid bruits.  Abdomen: no tenderness, no masses palpated. No hepatosplenomegaly. Bowel sounds positive.  Musculoskeletal: grossly normal, R shoulder limited due to pain. Skin: no rashes, lesions, ulcers.  No induration Neurologic: CN 2-12 grossly intact. Sensation intact, DTR normal. Strength 5/5 in all 4.  Psychiatric: Normal judgment and insight. Alert and oriented x 2. Normal mood.    Labs on Admission: I have personally reviewed following labs and imaging studies  CBC: Recent Labs  Lab 03/09/19 2022 03/09/19 2037  WBC 7.8  --   HGB 10.6* 11.9*  HCT 31.6* 35.0*  MCV 99.4  --   PLT 246  --    Basic Metabolic Panel: Recent Labs  Lab 03/09/19 2022 03/09/19 2037  NA 138 139  K 3.8 3.7  CL 102 100  CO2 25  --   GLUCOSE 106* 96  BUN 23 25*  CREATININE 1.29* 1.30*  CALCIUM 9.5  --  GFR: Estimated Creatinine Clearance: 29.1 mL/min (A) (by C-G formula based on SCr of 1.3 mg/dL (H)). Liver Function Tests: Recent Labs  Lab 03/09/19 2022  AST 29  ALT 21  ALKPHOS 65  BILITOT 0.7  PROT 6.9  ALBUMIN 3.6   No results for input(s): LIPASE, AMYLASE in the last 168 hours. No results for input(s): AMMONIA in the last 168 hours. Coagulation Profile: Recent Labs  Lab 03/09/19 2022  INR 1.0   Cardiac Enzymes: No results for input(s): CKTOTAL, CKMB, CKMBINDEX, TROPONINI in the last 168 hours. BNP (last 3 results) No results for input(s): PROBNP in the last 8760 hours. HbA1C: No results for input(s): HGBA1C in the last 72 hours. CBG: No results for input(s): GLUCAP in the last 168 hours. Lipid Profile: No results for input(s): CHOL, HDL, LDLCALC, TRIG, CHOLHDL, LDLDIRECT in the last 72 hours. Thyroid Function Tests: No results for input(s): TSH, T4TOTAL, FREET4, T3FREE, THYROIDAB in the last 72 hours. Anemia Panel: No results for input(s): VITAMINB12, FOLATE, FERRITIN, TIBC, IRON, RETICCTPCT in the last 72 hours. Urine analysis:    Component Value Date/Time   COLORURINE STRAW (A) 03/09/2019 2042   APPEARANCEUR CLEAR 03/09/2019 2042   LABSPEC 1.012 03/09/2019 2042   PHURINE 6.0 03/09/2019 2042   GLUCOSEU NEGATIVE 03/09/2019 2042   HGBUR NEGATIVE 03/09/2019 2042    HGBUR negative 03/17/2010 1010   BILIRUBINUR NEGATIVE 03/09/2019 2042   KETONESUR NEGATIVE 03/09/2019 2042   PROTEINUR NEGATIVE 03/09/2019 2042   UROBILINOGEN 0.2 03/17/2010 1010   NITRITE NEGATIVE 03/09/2019 2042   LEUKOCYTESUR NEGATIVE 03/09/2019 2042    Radiological Exams on Admission: Ct Head Wo Contrast  Result Date: 03/09/2019 CLINICAL DATA:  85 year old male with headache, neck pain and facial pain and swelling following fall. Initial encounter. EXAM: CT HEAD WITHOUT CONTRAST CT MAXILLOFACIAL WITHOUT CONTRAST CT CERVICAL SPINE WITHOUT CONTRAST TECHNIQUE: Multidetector CT imaging of the head, cervical spine, and maxillofacial structures were performed using the standard protocol without intravenous contrast. Multiplanar CT image reconstructions of the cervical spine and maxillofacial structures were also generated. COMPARISON:  None. FINDINGS: CT HEAD FINDINGS Brain: A 1.2 cm area of hemorrhage along septum pellucidum, a small amount of hemorrhage in the LEFT occipital horn and subarachnoid hemorrhage in the RIGHT aspect of the quadrigeminal cistern noted. Atrophy and chronic small-vessel white matter ischemic changes are present. There is no evidence of midline shift,, acute infarct, hydrocephalus or subdural/epidural hemorrhage. Vascular: Carotid and vertebral atherosclerotic calcifications noted Skull: Normal. Negative for fracture or focal lesion. Other: None. CT MAXILLOFACIAL FINDINGS Osseous: No fracture or mandibular dislocation. No destructive process. Orbits: Negative. No traumatic or inflammatory finding. Sinuses: Small amount of fluid within the LEFT maxillary sinus is noted. Soft tissues: RIGHT facial soft tissue swelling is noted. CT CERVICAL SPINE FINDINGS Alignment: Normal. Skull base and vertebrae: No acute fracture. No primary bone lesion or focal pathologic process. Soft tissues and spinal canal: No prevertebral fluid or swelling. No visible canal hematoma. Disc levels: Mild  multilevel degenerative disc disease/spondylosis and moderate multilevel facet arthropathy noted. Upper chest: No acute abnormality Other: None IMPRESSION: 1. 1.2 cm area of hemorrhage along the septum pellucidum, small amount of hemorrhage in the LEFT occipital horn and subarachnoid hemorrhage in the RIGHT aspect of the quadrigeminal cistern. 2. Atrophy and chronic small-vessel white matter ischemic changes. 3. RIGHT facial soft tissue swelling without fracture. 4. No static evidence of acute injury to the cervical spine. Critical Value/emergent results were called by telephone at the time of interpretation on 03/09/2019  at 9:43 pm to providerDAVID YAO , who verbally acknowledged these results. Electronically Signed   By: Margarette Canada M.D.   On: 03/09/2019 21:45   Ct Cervical Spine Wo Contrast  Result Date: 03/09/2019 CLINICAL DATA:  85 year old male with headache, neck pain and facial pain and swelling following fall. Initial encounter. EXAM: CT HEAD WITHOUT CONTRAST CT MAXILLOFACIAL WITHOUT CONTRAST CT CERVICAL SPINE WITHOUT CONTRAST TECHNIQUE: Multidetector CT imaging of the head, cervical spine, and maxillofacial structures were performed using the standard protocol without intravenous contrast. Multiplanar CT image reconstructions of the cervical spine and maxillofacial structures were also generated. COMPARISON:  None. FINDINGS: CT HEAD FINDINGS Brain: A 1.2 cm area of hemorrhage along septum pellucidum, a small amount of hemorrhage in the LEFT occipital horn and subarachnoid hemorrhage in the RIGHT aspect of the quadrigeminal cistern noted. Atrophy and chronic small-vessel white matter ischemic changes are present. There is no evidence of midline shift,, acute infarct, hydrocephalus or subdural/epidural hemorrhage. Vascular: Carotid and vertebral atherosclerotic calcifications noted Skull: Normal. Negative for fracture or focal lesion. Other: None. CT MAXILLOFACIAL FINDINGS Osseous: No fracture or  mandibular dislocation. No destructive process. Orbits: Negative. No traumatic or inflammatory finding. Sinuses: Small amount of fluid within the LEFT maxillary sinus is noted. Soft tissues: RIGHT facial soft tissue swelling is noted. CT CERVICAL SPINE FINDINGS Alignment: Normal. Skull base and vertebrae: No acute fracture. No primary bone lesion or focal pathologic process. Soft tissues and spinal canal: No prevertebral fluid or swelling. No visible canal hematoma. Disc levels: Mild multilevel degenerative disc disease/spondylosis and moderate multilevel facet arthropathy noted. Upper chest: No acute abnormality Other: None IMPRESSION: 1. 1.2 cm area of hemorrhage along the septum pellucidum, small amount of hemorrhage in the LEFT occipital horn and subarachnoid hemorrhage in the RIGHT aspect of the quadrigeminal cistern. 2. Atrophy and chronic small-vessel white matter ischemic changes. 3. RIGHT facial soft tissue swelling without fracture. 4. No static evidence of acute injury to the cervical spine. Critical Value/emergent results were called by telephone at the time of interpretation on 03/09/2019 at 9:43 pm to providerDAVID YAO , who verbally acknowledged these results. Electronically Signed   By: Margarette Canada M.D.   On: 03/09/2019 21:45   Dg Pelvis Portable  Result Date: 03/09/2019 CLINICAL DATA:  Initial evaluation for acute trauma, fall. EXAM: PORTABLE PELVIS 1-2 VIEWS COMPARISON:  None. FINDINGS: No acute fracture or dislocation. No pubic diastasis. SI joints approximated. Femoral heads normally position within the acetabula. Mild osteoarthritic changes about the hips bilaterally. Degenerative spondylolysis noted within the visualized lower lumbar spine. No visible soft tissue injury.  Hernia tacks overlie the pelvis. IMPRESSION: No acute osseous abnormality about the pelvis. Electronically Signed   By: Jeannine Boga M.D.   On: 03/09/2019 21:09   Dg Chest Port 1 View  Result Date:  03/09/2019 CLINICAL DATA:  Initial evaluation for acute trauma. EXAM: PORTABLE CHEST 1 VIEW COMPARISON:  Prior radiograph from 10/05/2015. FINDINGS: Allowing for AP technique, cardiac and mediastinal silhouettes are stable in size and contour, and remain within normal limits. Aortic atherosclerosis. Lungs are hypoinflated. Secondary mild diffuse bronchovascular crowding. Mild blunting of the left costophrenic angle suggestive of a small left pleural effusion. Associated mild left basilar subsegmental atelectasis. No other focal airspace disease. No pulmonary edema. No pneumothorax. No acute osseous finding. Degenerative changes noted about the shoulders bilaterally. IMPRESSION: 1. Possible small left pleural effusion with associated mild left basilar atelectasis. 2. No other active cardiopulmonary disease. 3.  Aortic Atherosclerosis (ICD10-I70.0). Electronically Signed  By: Jeannine Boga M.D.   On: 03/09/2019 21:06   Dg Shoulder Right Portable  Result Date: 03/09/2019 CLINICAL DATA:  Initial evaluation for acute trauma, fall. EXAM: PORTABLE RIGHT SHOULDER COMPARISON:  None. FINDINGS: No acute fracture or dislocation. Humeral head in normal alignment within the glenoid. AC joint approximated. Mild osteoarthritic changes noted about the right AC joint. No periarticular calcification. Visualized right hemithorax clear. IMPRESSION: No acute osseous abnormality about the right shoulder. Electronically Signed   By: Jeannine Boga M.D.   On: 03/09/2019 21:11   Dg Humerus Right  Result Date: 03/09/2019 CLINICAL DATA:  Initial evaluation for acute trauma, fall. EXAM: RIGHT HUMERUS - 2+ VIEW COMPARISON:  None. FINDINGS: There is no evidence of fracture or other focal bone lesions. Soft tissues are unremarkable. IMPRESSION: No acute osseous abnormality about the right humerus. Electronically Signed   By: Jeannine Boga M.D.   On: 03/09/2019 21:08   Ct Maxillofacial Wo Contrast  Result Date:  03/09/2019 CLINICAL DATA:  85 year old male with headache, neck pain and facial pain and swelling following fall. Initial encounter. EXAM: CT HEAD WITHOUT CONTRAST CT MAXILLOFACIAL WITHOUT CONTRAST CT CERVICAL SPINE WITHOUT CONTRAST TECHNIQUE: Multidetector CT imaging of the head, cervical spine, and maxillofacial structures were performed using the standard protocol without intravenous contrast. Multiplanar CT image reconstructions of the cervical spine and maxillofacial structures were also generated. COMPARISON:  None. FINDINGS: CT HEAD FINDINGS Brain: A 1.2 cm area of hemorrhage along septum pellucidum, a small amount of hemorrhage in the LEFT occipital horn and subarachnoid hemorrhage in the RIGHT aspect of the quadrigeminal cistern noted. Atrophy and chronic small-vessel white matter ischemic changes are present. There is no evidence of midline shift,, acute infarct, hydrocephalus or subdural/epidural hemorrhage. Vascular: Carotid and vertebral atherosclerotic calcifications noted Skull: Normal. Negative for fracture or focal lesion. Other: None. CT MAXILLOFACIAL FINDINGS Osseous: No fracture or mandibular dislocation. No destructive process. Orbits: Negative. No traumatic or inflammatory finding. Sinuses: Small amount of fluid within the LEFT maxillary sinus is noted. Soft tissues: RIGHT facial soft tissue swelling is noted. CT CERVICAL SPINE FINDINGS Alignment: Normal. Skull base and vertebrae: No acute fracture. No primary bone lesion or focal pathologic process. Soft tissues and spinal canal: No prevertebral fluid or swelling. No visible canal hematoma. Disc levels: Mild multilevel degenerative disc disease/spondylosis and moderate multilevel facet arthropathy noted. Upper chest: No acute abnormality Other: None IMPRESSION: 1. 1.2 cm area of hemorrhage along the septum pellucidum, small amount of hemorrhage in the LEFT occipital horn and subarachnoid hemorrhage in the RIGHT aspect of the quadrigeminal  cistern. 2. Atrophy and chronic small-vessel white matter ischemic changes. 3. RIGHT facial soft tissue swelling without fracture. 4. No static evidence of acute injury to the cervical spine. Critical Value/emergent results were called by telephone at the time of interpretation on 03/09/2019 at 9:43 pm to providerDAVID YAO , who verbally acknowledged these results. Electronically Signed   By: Margarette Canada M.D.   On: 03/09/2019 21:45    EKG: Independently reviewed.  Assessment/Plan Principal Problem:   ICH (intracerebral hemorrhage) (HCC) Active Problems:   Hypothyroidism   Essential hypertension   Rheumatoid arthritis ([place])    1. Traumatic ICH - 1. Holding ASA and brilinta 2. No specific reversal agent for brilinta available (Spoke with pharmacy: a monoclonal AB agent for brilinta reversal has been FDA approved; however, it is not in production yet and our pharmacy doesn't have access to it). 3. ICH pathway 4. Frequent neuro checks 5. Repeat CT head  at 0600 6. Swallow screen 1. But keeping patient NPO overnight in case he becomes a surgical case. 7. Got DDAVP in ED per NS 2. Hypothyroidism - 1. Convert synthroid to IV for the moment 3. HTN - 1. Holding PO home lisinopril-HCTZ 2. Use PRN labetalol if needed 3. Not a HTN bleed 4. RA - 1. Takes MTX on Saturday 2. Resume MTX if taking POs by Sat.  DVT prophylaxis: SCDs Code Status: Full code for now Family Communication: Daughter at bedside Disposition Plan: Home after admit Consults called: NS Admission status: Admit to inpatient  Severity of Illness: The appropriate patient status for this patient is INPATIENT. Inpatient status is judged to be reasonable and necessary in order to provide the required intensity of service to ensure the patient's safety. The patient's presenting symptoms, physical exam findings, and initial radiographic and laboratory data in the context of their chronic comorbidities is felt to place them at high  risk for further clinical deterioration. Furthermore, it is not anticipated that the patient will be medically stable for discharge from the hospital within 2 midnights of admission. The following factors support the patient status of inpatient.   IP status for traumatic ICH.   * I certify that at the point of admission it is my clinical judgment that the patient will require inpatient hospital care spanning beyond 2 midnights from the point of admission due to high intensity of service, high risk for further deterioration and high frequency of surveillance required.*    Kyrsten Deleeuw M. DO [facility]  How to contact the [facility] Attending or Consulting provider [place] or covering provider during after hours [place], for this patient?  1. Check the care team in [facility] and look for a) attending/consulting [facility] provider listed and b) the [facility] team listed 2. Log into [email or web address]  Amion Physician Scheduling and messaging for groups and whole hospitals  On call and physician scheduling software for group practices, residents, hospitalists and other medical providers for call, clinic, rotation and shift schedules. OnCall [employer] is a hospital-wide system for scheduling doctors and paging doctors on call. EasyPlot is for scientific plotting and data analysis.  [email or web address]  and use [place]'s universal password to access. If you do not have the password, please contact the hospital operator.  3. Locate the [facility] provider you are looking for under [facility] and page to a number that you can be directly reached. 4. If you still have difficulty reaching the provider, please page the [facility] (Director on Call) for the Hospitalists listed on amion for assistance.  03/10/2019, 1:01 AM

## 2019-03-10 NOTE — Progress Notes (Signed)
  NEUROSURGERY PROGRESS NOTE   Asked to clear c spine by nursing. Patient alert and oriented. Patient without neck pain. Does endorse right shoulder pain from injury. No N/T in extremities. No tenderness to palpation along C spine. Full ROM without pain. Normal CT C spine.     C spine cleared. Aspen collar removed.

## 2019-03-10 NOTE — Evaluation (Signed)
Physical Therapy Evaluation Patient Details Name: [name] MRN: [id number] DOB: 03/08/1929 Today's Date: 03/10/2019   History of Present Illness  85 yo admitted s/p fall at home with ICH. PMhx: RA, HTN, CVA, HLD, adenocarcinoma s/p hemicolectomy  Clinical Impression  Pt with collar in place on arrival with daughter present. Pt able to converse in English with limited need for interpretation from daughter. Pt with condom cath on and limited ability to comprehend its function. Pt cued for brushing teeth in bathroom and immediately searching for soap to wash hands then distracted by need to void and never completed task. Pt with RLE weakness compared to left but able to perform functional mobility. Pt with supportive family currently receiving HHPT and appropriate for return home with continued services. Pt with decreased cognition, mobility and gait who will benefit from acute therapy to maximize mobility, safety and independence to decrease burden of care. Per daughter pt has been educated for RW use in past but prefers cane with recommendation for RW use to increase stability and assist with fall prevention with daughter voicing understanding.     Follow Up Recommendations Home health PT    Equipment Recommendations  None recommended by PT    Recommendations for Other Services       Precautions / Restrictions Precautions Precautions: Fall;Cervical Required Braces or Orthoses: Cervical Brace Cervical Brace: Hard collar;At all times Restrictions Weight Bearing Restrictions: No      Mobility  Bed Mobility Overal bed mobility: Needs Assistance Bed Mobility: Rolling;Sidelying to Sit Rolling: Min guard Sidelying to sit: Mod assist       General bed mobility comments: provided education for log rolling technique for bed mobility. required in A to roll and rise to sitting EOB  Transfers Overall transfer level: Needs assistance Equipment used: 1 person hand held assist Transfers:  Sit to/from Stand Sit to Stand: Min assist         General transfer comment: min A for safety and balance  Ambulation/Gait Ambulation/Gait assistance: Min assist Gait Distance (Feet): 20 Feet Assistive device: 1 person hand held assist Gait Pattern/deviations: Step-through pattern;Decreased stride length   Gait velocity interpretation: 1.31 - 2.62 ft/sec, indicative of limited community ambulator General Gait Details: hand held assist on left to mimic cane use with narrow BOS and assist to direct pt within room. Daughter reports baseline distance to be grossly 85'  Stairs            Wheelchair Mobility    Modified Rankin (Stroke Patients Only)       Balance Overall balance assessment: Needs assistance Sitting-balance support: Feet supported;No upper extremity supported Sitting balance-Leahy Scale: Fair Sitting balance - Comments: pt able to sit EOB without physical assist   Standing balance support: Single extremity supported;During functional activity Standing balance-Leahy Scale: Poor Standing balance comment: pt required external support to maintain balance                             Pertinent Vitals/Pain Pain Assessment: No/denies pain    Home Living Family/patient expects to be discharged to:: Private residence Living Arrangements: Spouse/significant other;Children Available Help at Discharge: Family;Available 24 hours/day Type of Home: House Home Access: Stairs to enter Entrance Stairs-Rails: None Entrance Stairs-Number of Steps: 3 Home Layout: Two level;Able to live on main level with bedroom/bathroom Home Equipment: Cane - quad;Shower seat - built in;Walker - 2 wheels Additional Comments: daughter present throughout session    Prior Function Level of  Independence: Independent with assistive device(s)       Comments: walks with cane, wife assists with bathing/ dressing, family performs all homemaking     Hand Dominance   Dominant  Hand: Right    Extremity/Trunk Assessment   Upper Extremity Assessment Upper Extremity Assessment: Defer to OT evaluation LUE Deficits / Details: Pt hit shoulder during fall, has pain with movement of L shoulder. imaging showed no fx,     Lower Extremity Assessment Lower Extremity Assessment: LLE deficits/detail;RLE deficits/detail RLE Deficits / Details: hip flexion and knee extension 3/5 LLE Deficits / Details: 5/5 hip flexion and knee extension    Cervical / Trunk Assessment Cervical / Trunk Assessment: Normal;Other exceptions(wearing cervical collar)  Communication   Communication: Prefers language other than English;Interpreter utilized(pt able to provide history as well as daughter present in room)  Cognition Arousal/Alertness: Awake/alert Behavior During Therapy: WFL for tasks assessed/performed Overall Cognitive Status: History of cognitive impairments - at baseline                                 General Comments: daughter reports cognition is about the same as baseline      General Comments General comments (skin integrity, edema, etc.): Pt daughter present throughout session. VSS    Exercises General Exercises - Upper Extremity Shoulder Flexion: Right;5 reps;Seated;AAROM   Assessment/Plan    PT Assessment Patient needs continued PT services  PT Problem List Decreased strength;Decreased mobility;Decreased safety awareness;Decreased activity tolerance;Decreased balance;Decreased knowledge of use of DME;Decreased cognition       PT Treatment Interventions Gait training;Therapeutic exercise;Patient/family education;Functional mobility training;DME instruction;Therapeutic activities;Stair training    PT Goals (Current goals can be found in the Care Plan section)  Acute Rehab PT Goals Patient Stated Goal: return home to meditate PT Goal Formulation: With patient/family Time For Goal Achievement: 03/24/19 Potential to Achieve Goals: Fair     Frequency Min 3X/week   Barriers to discharge        Co-evaluation   Reason for Co-Treatment: For patient/therapist safety;To address functional/ADL transfers   OT goals addressed during session: ADL's and self-care       AM-PAC PT "6 Clicks" Mobility  Outcome Measure Help needed turning from your back to your side while in a flat bed without using bedrails?: A Little Help needed moving from lying on your back to sitting on the side of a flat bed without using bedrails?: A Little Help needed moving to and from a bed to a chair (including a wheelchair)?: A Little Help needed standing up from a chair using your arms (e.g., wheelchair or bedside chair)?: A Little Help needed to walk in hospital room?: A Little Help needed climbing 3-5 steps with a railing? : A Little 6 Click Score: 18    End of Session Equipment Utilized During Treatment: Gait belt Activity Tolerance: Patient tolerated treatment well Patient left: in chair;with call bell/phone within reach;with chair alarm set;with family/visitor present Nurse Communication: Mobility status PT Visit Diagnosis: Other abnormalities of gait and mobility (R26.89);Muscle weakness (generalized) (M62.81);History of falling (Z91.81)    Time: [id number] PT Time Calculation (min) (ACUTE ONLY): 26 min   Charges:   PT Evaluation $PT Eval Moderate Complexity: [place], PT Acute Rehabilitation Services Pager: [phone] Office: [phone]   Josias Tomerlin B Lurae Hornbrook 03/10/2019, 1:17 PM

## 2019-03-10 NOTE — Progress Notes (Addendum)
OT Cancellation Note  Patient Details Name: Kasper Gallucci MRN: [id number] DOB: 09/28/1928   Cancelled Treatment:    Reason Eval/Treat Not Completed: Active bedrest order Will return as schedule allows.  Gus Rankin, OT Student  Gus Rankin 03/10/2019, 7:47 AM

## 2019-03-10 NOTE — Progress Notes (Signed)
Occupational Therapy Evaluation Patient Details Name: [name] MRN: [id number] DOB: 02-Aug-1928 Today's Date: 03/10/2019    History of Present Illness 85 yo admitted s/p fall at home with ICH. PMhx: RA, HTN, CVA, HLD, adenocarcinoma s/p hemicolectomy   Clinical Impression   PTA, pt lived with wife and required assist for BADLs and IADLs from wife and family. Pt currently presents with decreased balance, safety, activity tolerance, and increased pain impacting safe performance of ADLs. Pt performed hand hygiene and functional transfers with min A for balance and safety.  Pt required min A for UB dressing, and max A for LB dressing for safety. Pt would benefit from continued OT acutely to facilitate safe dc. Recommend dc home with HHOT to improve safe performance of ADLs.     Follow Up Recommendations  Home health OT    Equipment Recommendations  None recommended by OT    Recommendations for Other Services PT consult     Precautions / Restrictions Precautions Precautions: Fall;Cervical Required Braces or Orthoses: Cervical Brace Cervical Brace: Hard collar;At all times Restrictions Weight Bearing Restrictions: No      Mobility Bed Mobility Overal bed mobility: Needs Assistance Bed Mobility: Rolling;Sidelying to Sit Rolling: Min assist Sidelying to sit: Mod assist       General bed mobility comments: provided education for log rolling technique for bed mobility. required in A to roll, mod A to raise to sitting EOB  Transfers Overall transfer level: Needs assistance Equipment used: 1 person hand held assist Transfers: Sit to/from Stand Sit to Stand: Min assist         General transfer comment: min A for safety and balance    Balance Overall balance assessment: Needs assistance Sitting-balance support: Feet supported;No upper extremity supported Sitting balance-Leahy Scale: Fair     Standing balance support: Single extremity supported;During functional  activity Standing balance-Leahy Scale: Poor Standing balance comment: pt required external support to maintain balance                           ADL either performed or assessed with clinical judgement   ADL Overall ADL's : Needs assistance/impaired Eating/Feeding: Independent;Sitting   Grooming: Wash/dry hands;Minimal assistance;Standing Grooming Details (indicate cue type and reason): performed hand hygiene at sink with min A for balance Upper Body Bathing: Minimal assistance;Sitting   Lower Body Bathing: Maximal assistance;Sit to/from stand   Upper Body Dressing : Minimal assistance;Sitting   Lower Body Dressing: Sit to/from stand;Total assistance Lower Body Dressing Details (indicate cue type and reason): Pt required total A to don socks sitting EOB Toilet Transfer: Minimal assistance;Ambulation;Regular Toilet;Grab bars Toilet Transfer Details (indicate cue type and reason): pt required min A for balance and safety. provided education that pt was wearing condom catheter and was able to urinate into the bag, however pt did not understand Toileting- Clothing Manipulation and Hygiene: Minimal assistance;Sit to/from stand       Functional mobility during ADLs: Minimal assistance General ADL Comments: Pt required min A throughout for safety and balance     Vision Baseline Vision/History: Wears glasses       Perception     Praxis      Pertinent Vitals/Pain Pain Assessment: No/denies pain     Hand Dominance Right   Extremity/Trunk Assessment Upper Extremity Assessment Upper Extremity Assessment: Generalized weakness;LUE deficits/detail LUE Deficits / Details: Pt hit shoulder during fall, has pain with movement of L shoulder. imaging showed no fx, encouraged to move/use L shoulder  to decrease pain LUE: Unable to fully assess due to pain LUE Coordination: decreased gross motor   Lower Extremity Assessment Lower Extremity Assessment: Defer to PT evaluation    Cervical / Trunk Assessment Cervical / Trunk Assessment: Normal;Other exceptions(wearing cervical collar)   Communication Communication Communication: Prefers language other than English;Interpreter utilized(Pt able to provide history, daughter present in room.)   Cognition Arousal/Alertness: Awake/alert Behavior During Therapy: WFL for tasks assessed/performed Overall Cognitive Status: History of cognitive impairments - at baseline                                 General Comments: daughter reports cognition is about the same as baseline   General Comments  Pt daughter present throughout session. VSS    Exercises Exercises: General Upper Extremity General Exercises - Upper Extremity Shoulder Flexion: Right;5 reps;[facility]   Shoulder Instructions      Home Living Family/patient expects to be discharged to:: Private residence Living Arrangements: Spouse/significant other;Children Available Help at Discharge: Family;Available 24 hours/day Type of Home: House Home Access: Stairs to enter [employer] of Steps: 3 Entrance Stairs-Rails: None Home Layout: Two level;Able to live on main level with bedroom/bathroom     Bathroom Shower/Tub: Occupational psychologist: Standard     Home Equipment: Cane - quad;Shower seat - built in;Walker - 2 wheels   Additional Comments: daughter present throughout session  Lives With: Spouse    Prior Functioning/Environment Level of Independence: Needs assistance  Gait / Transfers Assistance Needed: uses cane for walking ADL's / Homemaking Assistance Needed: Wife helps him bathe and dress   Comments: walks with cane, wife assists with BADLs, family assists with IADLs        OT Problem List: Decreased strength;Decreased range of motion;Decreased activity tolerance;Impaired balance (sitting and/or standing);Decreased safety awareness;Decreased knowledge of use of DME or AE;Decreased knowledge of  precautions;Pain      OT Treatment/Interventions: Self-care/ADL training;DME and/or AE instruction;Therapeutic activities;Balance training;Patient/family education    OT Goals(Current goals can be found in the care plan section) Acute Rehab OT Goals Patient Stated Goal: go home OT Goal Formulation: With patient/family Time For Goal Achievement: 03/24/19 Potential to Achieve Goals: Good ADL Goals Pt Will Perform Grooming: with supervision;standing Pt Will Perform Upper Body Dressing: with supervision;sitting Pt Will Perform Lower Body Dressing: with min assist;sit to/from stand Pt Will Transfer to Toilet: with min guard assist;ambulating;bedside commode Pt Will Perform Toileting - Clothing Manipulation and hygiene: with supervision;sit to/from stand Pt Will Perform Tub/Shower Transfer: Shower transfer;with min guard assist;ambulating;shower seat  OT Frequency: Min 3X/week   Barriers to D/C:            Co-evaluation PT/OT/SLP Co-Evaluation/Treatment: Yes Reason for Co-Treatment: For patient/therapist safety;To address functional/ADL transfers   OT goals addressed during session: ADL's and self-care      AM-PAC OT "6 Clicks" Daily Activity     Outcome Measure Help from another person eating meals?: None Help from another person taking care of personal grooming?: A Little Help from another person toileting, which includes using toliet, bedpan, or urinal?: A Little Help from another person bathing (including washing, rinsing, drying)?: A Lot Help from another person to put on and taking off regular upper body clothing?: A Little Help from another person to put on and taking off regular lower body clothing?: A Lot 6 Click Score: 17   End of Session Equipment Utilized During Treatment: Gait belt;Cervical collar Nurse  Communication: Mobility status  Activity Tolerance: Patient tolerated treatment well;Patient limited by pain Patient left: in chair;with call bell/phone within  reach;with chair alarm set;with family/visitor present  OT Visit Diagnosis: Unsteadiness on feet (R26.81);Other abnormalities of gait and mobility (R26.89);History of falling (Z91.81);Muscle weakness (generalized) (M62.81);Pain Pain - Right/Left: Right Pain - part of body: Shoulder                Time: [id number] OT Time Calculation (min): 27 min Charges:  OT General Charges $OT Visit: 1 Visit OT Evaluation $OT Eval Moderate Complexity: [place], OT Student  Gus Rankin 03/10/2019, 11:41 AM

## 2019-03-10 NOTE — Evaluation (Signed)
Clinical/Bedside Swallow Evaluation Patient Details  Name: [name] MRN: [id number] Date of Birth: [birth year]  Today's Date: 03/10/2019 Time: SLP Start Time (ACUTE ONLY): 0930 SLP Stop Time (ACUTE ONLY): 0950 SLP Time Calculation (min) (ACUTE ONLY): 20 min  Past Medical History:  Past Medical History:  Diagnosis Date  . Adenocarcinoma ([place]) 1985   colon s/p right hemicolectomy and chemotherapy. F/U with cancer center and Dr. Lucia Gaskins rotinely  . Depression   . Eczema   . Epistaxis    f/u per ENT  . Erectile dysfunction   . Gastric ulcer 12/11   H-Pylori Tx, EGD 3-12: gastritis  . Hay fever   . Head injury 11/2017   with fall  . Hyperlipemia   . Hypertension   . Hypothyroidism    Hypothyroid  . Insomnia   . Osteopenia     per DEXA 07-2008 (Rx fosamax)  . Rheumatoid arthritis ([place])   . Stroke [facility])     " balance issue"   Past Surgical History:  Past Surgical History:  Procedure Laterality Date  . COLON SURGERY     Right / Adenocarcinoma / Chemo  . COLONOSCOPY  2005, 2008   Dr. Lucia Gaskins  . INGUINAL HERNIA REPAIR     Right  . IR ANGIO INTRA EXTRACRAN SEL COM CAROTID INNOMINATE BILAT MOD SED  12/13/2017  . IR ANGIO VERTEBRAL SEL VERTEBRAL BILAT MOD SED  12/13/2017  . IR PTA INTRACRANIAL  12/18/2017  . RADIOLOGY WITH ANESTHESIA N/A 12/18/2017   Procedure: Angioplasty with stenting;  Surgeon: Luanne Bras, MD;  Location: [facility];  Service: Radiology;  Laterality: N/A;   HPI:  85yo male admitted 03/10/2019 from home after a fall; some confusion per dtr. Lives at home with wife. [place] is primary language, but speaks/understands English. 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  HeadCT = 1.2cm hemorrhage along the septum pellucidum, small hemorrhage in the left occipital horn of the left lateral ventricle, and SAH right aspect of the quadrigeminal cistern.   Assessment / Plan / Recommendation Clinical Impression  Pt  seen at bedside for clinical swallow evaluation to determine least restrictive diet. Pt's daughter was present during this assessment, and assisted with interpreting. Pt has upper denture, lower partial. No reported history of swallowing deficits. Pt is a vegetarian. Pt accepted trials of thin liquid, puree, and solid textures. All consistencies were tolerated well, without overt s/s aspiration or obvious oral deficit. Will begin vegetarian diet and follow for assessment of diet tolerance and education. Safe swallow precautions posted at [facility]. RN aware.    SLP Visit Diagnosis: Dysphagia, unspecified (R13.10)    Aspiration Risk  Mild aspiration risk    Diet Recommendation Regular;Thin liquid   Liquid Administration via: Cup;Straw Medication Administration: Whole meds with liquid Supervision: Patient able to self feed;Staff to assist with self feeding;Intermittent supervision to cue for compensatory strategies Compensations: Minimize environmental distractions;Slow rate;Small sips/bites Postural Changes: Seated upright at 90 degrees;Remain upright for at least 30 minutes after po intake    Other  Recommendations Oral Care Recommendations: Oral care BID   Follow up Recommendations (TBD)      Frequency and Duration min 1 x/week  1 week       Prognosis Prognosis for Safe Diet Advancement: Good      Swallow Study   General Date of Onset: 03/10/19 HPI: 85yo male admitted 03/10/2019 from home after a fall; some confusion per dtr. Lives at home with wife. [place] is primary language,  but speaks/understands English. 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  HeadCT = 1.2cm hemorrhage along the septum pellucidum, small hemorrhage in the left occipital horn of the left lateral ventricle, and SAH right aspect of the quadrigeminal cistern. Type of Study: Bedside Swallow Evaluation Previous Swallow Assessment: none Diet Prior to this Study:  NPO Temperature Spikes Noted: No Respiratory Status: Room air History of Recent Intubation: No Behavior/Cognition: Alert;Cooperative;Pleasant mood Oral Cavity Assessment: Within Functional Limits Oral Care Completed by SLP: No Oral Cavity - Dentition: Dentures, top;Dentures, bottom Vision: Functional for self-feeding Self-Feeding Abilities: Able to feed self;Needs assist;Needs set up Patient Positioning: Upright in bed Baseline Vocal Quality: Normal Volitional Cough: Strong Volitional Swallow: Able to elicit    Oral/Motor/Sensory Function Overall Oral Motor/Sensory Function: Within functional limits   Ice Chips Ice chips: Within functional limits Presentation: Spoon   Thin Liquid Thin Liquid: Within functional limits Presentation: Straw    Nectar Thick Nectar Thick Liquid: Not tested   Honey Thick Honey Thick Liquid: Not tested   Puree Puree: Within functional limits Presentation: Spoon   Solid     Solid: Within functional limits Presentation: [place] [facility], [facility], [place] Speech Language Pathologist Office: [phone] Pager: [phone]  Shonna Chock 03/10/2019,10:08 AM

## 2019-03-10 NOTE — Progress Notes (Signed)
Patient ID: [name], male   DOB: 27-Jul-1928, 85 y.o.   MRN: [id number] Patient wide awake and alert not complaining of any headache moves all extremities well somewhat limited right arm secondary to shoulder injury.  Follow-up CT scan shows minimal increase in intraventricular hemorrhage overall stable.  No new neurosurgical recommendations recommend staying off the Brilinta for at least 2 weeks and only restart if we absolutely have to have him on it.

## 2019-03-10 NOTE — Progress Notes (Signed)
PT Cancellation Note  Patient Details Name: Cash Brierley MRN: [id number] DOB: [birth year]   Cancelled Treatment:    Reason Eval/Treat Not Completed: Active bedrest order   Sandy Salaam Kdyn Vonbehren 03/10/2019, 7:03 AM  Elwyn Reach, PT Acute Rehabilitation Services Pager: [phone] Office: [phone]

## 2019-03-10 NOTE — Progress Notes (Signed)
PROGRESS NOTE    [name]  [id number] DOB: [birth year] DOA: 03/09/2019 PCP: Kristopher Glee., MD   Brief Narrative:  as per HPI: 85 y.o. male with medical history significant of RA, prior stroke on brilinta, HTN. Patient presents to the ED following a fall at home.  Hit head on kitchen cabinet.  Lives at home with wife.  Daughter accompanies patient. He denies any headaches nausea vomiting or vision changes.  Pain in R shoulder, difficulty moving arm due to pain.Daughter does report some confusion upon patient being asked specific questions In ER:CT head shows 1.2 cm area of hemorrhage along the septum pellucidum, small hemorrhage in the left occipital horn of his left lateral ventricle, and subarachnoid hemorrhage in the right aspect of the quadrigeminal cistern. X ray of R shoulder, neg for fracture. NEUROSURGERY saw patient, and was admitted for further monitoring Daughter at the bedside helping with translation per request.  Subjective: Seen this morning having his meal, daughter at the bedside.  No headache nausea vomiting.  Assessment & Plan:  ICH traumatic: Overall tolerating, no headache no acute neurosurgical intervention needed as per neurosurgery, continue to monitor neuro checks.  Blood pressure controlled in 120s Home meds remains on hold.  Patient is a status post DD AVP.  Follow-up CT head showed slight worsening of bleeding.  Continue to hold aspirin and Brilinta.  Per daughter patient was placed on Brilinta by Dr Patrecia Pour.  Hypothyroidism: Resume oral Synthroid.  Essential hypertension: Blood cervix control Hyzaar remains on hold.  Resume Hyzaar if blood pressure starts to creep up. keep below 140s  Rheumatoid arthritis : Chronic and stable.  On methotrexate.  There is no height or weight on file to calculate BMI.   DVT prophylaxis:SCD. Code Status:FULL Family Communication: plan of care discussed with patient in detail.  Discussed with the daughter at  bedside. Disposition Plan: Remains inpatient pending further stabilization in [place] and once cleared by neurosurgery plan to return home.  Seen by PT OT.   Consultants: neuroSurgery  Procedures:none CT Brain 10/19 IMPRESSION: 1. Mild increase in intraventricular hemorrhage without ventricular obstruction. 2. Stable quadrigeminal cistern subarachnoid hemorrhage  Microbiology:none  Antimicrobials: Anti-infectives (From admission, onward)   None       Objective: Vitals:   03/10/19 0350 03/10/19 0700 03/10/19 0739 03/10/19 1304  BP: 140/64 131/61 131/61 123/62  Pulse: 76 61 (!) 59 62  Resp: 12 17 17 19   Temp: 98.3 F (36.8 C) 98.7 F (37.1 C) 98.7 F (37.1 C) 98.4 F (36.9 C)  TempSrc: Oral Oral Oral Oral  SpO2: 98% 96%  100%    Intake/Output Summary (Last 24 hours) at 03/10/2019 1501 Last data filed at 03/10/2019 1200 Gross per 24 hour  Intake 290 ml  Output 1025 ml  Net -735 ml   There were no vitals filed for this visit. Weight change:   There is no height or weight on file to calculate BMI.  Intake/Output from previous day: 10/18 0701 - 10/19 0700 In: 24 [IV Piggyback:50] Out: -  Intake/Output this shift: Total I/O In: 240 [P.O.:240] Out: 1025 [Urine:1025]  Examination:  General exam: Appears calm and comfortable neck collar in place HEENT:PERRL,Oral mucosa moist, Ear/Nose normal on gross exam Respiratory system: Bilateral equal air entry, normal vesicular breath sounds Cardiovascular system: S1 & S2 heard,No JVD, murmurs. Gastrointestinal system: Abdomen is  soft, non tender, non distended, BS +  Nervous System:Alert and oriented. No focal neurological deficits/moving extremities, sensation intact. Extremities: No edema, no clubbing, distal  peripheral pulses palpable. Skin: No rashes, lesions, no icterus MSK: Normal muscle bulk,tone ,power  Medications:  Scheduled Meds:   stroke: mapping our early stages of recovery book   Does not apply Once    levothyroxine  56 mcg Intravenous Daily   pantoprazole (PROTONIX) IV  40 mg Intravenous QHS   polyvinyl alcohol  1 drop Both Eyes QHS   senna-docusate  1 tablet Oral BID   Continuous Infusions:  Data Reviewed: I have personally reviewed following labs and imaging studies  CBC: Recent Labs  Lab 03/09/19 2022 03/09/19 2037  WBC 7.8  --   HGB 10.6* 11.9*  HCT 31.6* 35.0*  MCV 99.4  --   PLT 246  --    Basic Metabolic Panel: Recent Labs  Lab 03/09/19 2022 03/09/19 2037 03/10/19 0330  NA 138 139 138  K 3.8 3.7 3.7  CL 102 100 102  CO2 25  --  24  GLUCOSE 106* 96 106*  BUN 23 25* 20  CREATININE 1.29* 1.30* 1.24  CALCIUM 9.5  --  9.0   GFR: Estimated Creatinine Clearance: 30.5 mL/min (by C-G formula based on SCr of 1.24 mg/dL). Liver Function Tests: Recent Labs  Lab 03/09/19 2022  AST 29  ALT 21  ALKPHOS 65  BILITOT 0.7  PROT 6.9  ALBUMIN 3.6   No results for input(s): LIPASE, AMYLASE in the last 168 hours. No results for input(s): AMMONIA in the last 168 hours. Coagulation Profile: Recent Labs  Lab 03/09/19 2022  INR 1.0   Cardiac Enzymes: No results for input(s): CKTOTAL, CKMB, CKMBINDEX, TROPONINI in the last 168 hours. BNP (last 3 results) No results for input(s): PROBNP in the last 8760 hours. HbA1C: No results for input(s): HGBA1C in the last 72 hours. CBG: No results for input(s): GLUCAP in the last 168 hours. Lipid Profile: No results for input(s): CHOL, HDL, LDLCALC, TRIG, CHOLHDL, LDLDIRECT in the last 72 hours. Thyroid Function Tests: No results for input(s): TSH, T4TOTAL, FREET4, T3FREE, THYROIDAB in the last 72 hours. Anemia Panel: No results for input(s): VITAMINB12, FOLATE, FERRITIN, TIBC, IRON, RETICCTPCT in the last 72 hours. Sepsis Labs: Recent Labs  Lab 03/09/19 2022  LATICACIDVEN 1.3    Recent Results (from the past 240 hour(s))  SARS CORONAVIRUS 2 (TAT 6-24 HRS) Nasopharyngeal Nasopharyngeal Swab     Status: None    Collection Time: 03/10/19  3:21 AM   Specimen: Nasopharyngeal Swab  Result Value Ref Range Status   SARS Coronavirus 2 NEGATIVE NEGATIVE Final    Comment: (NOTE) SARS-CoV-2 target nucleic acids are NOT DETECTED. The SARS-CoV-2 RNA is generally detectable in upper and lower respiratory specimens during the acute phase of infection. Negative results do not preclude SARS-CoV-2 infection, do not rule out co-infections with other pathogens, and should not be used as the sole basis for treatment or other patient management decisions. Negative results must be combined with clinical observations, patient history, and epidemiological information. The expected result is Negative. Fact Sheet for Patients: SugarRoll.be Fact Sheet for Healthcare Providers: https://www.woods-mathews.com/ This test is not yet approved or cleared by the [place] FDA and  has been authorized for detection and/or diagnosis of SARS-CoV-2 by FDA under an Emergency Use Authorization (EUA). This EUA will remain  in effect (meaning this test can be used) for the duration of the COVID-19 declaration under Section 56 4(b)(1) of the Act, 21 U.S.C. section 360bbb-3(b)(1), unless the authorization is terminated or revoked sooner. Performed at [facility], [place]  [address], [place], [place] [address]   Surgical PCR screen     Status: Abnormal   Collection Time: 03/10/19  4:18 AM   Specimen: Nasal Mucosa; Nasal Swab  Result Value Ref Range Status   MRSA, PCR NEGATIVE NEGATIVE Final   Staphylococcus aureus POSITIVE (A) NEGATIVE Final    Comment: (NOTE) The Xpert SA Assay (FDA approved for NASAL specimens in patients 74 years of age and older), is one component of a comprehensive surveillance program. It is not intended to diagnose infection nor to guide or monitor treatment. Performed at [facility], [place] [address], [place], [place] [address]       Radiology  Studies: Ct Head Wo Contrast  Result Date: 03/10/2019 CLINICAL DATA:  Follow-up subdural hemorrhage EXAM: CT HEAD WITHOUT CONTRAST TECHNIQUE: Contiguous axial images were obtained from the base of the skull through the vertex without intravenous contrast. COMPARISON:  Yesterday FINDINGS: Brain: Unchanged subarachnoid hemorrhage in the right more than left quadrigeminal cistern. 16 mm hemorrhage along the anterior septum pellucidum, unchanged. Hemorrhage rightward of the more posterior septum pellucidum and layering in the occipital horns of the lateral ventricles has mildly increased. No ventricular obstruction. Generalized atrophy and chronic ischemic injury. Vascular: Atherosclerotic calcification Skull: Negative for fracture. Sinuses/Orbits: Bilateral cataract resection. Chronic left maxillary sinusitis. IMPRESSION: 1. Mild increase in intraventricular hemorrhage without ventricular obstruction. 2. Stable quadrigeminal cistern subarachnoid hemorrhage Electronically Signed   By: Monte Fantasia M.D.   On: 03/10/2019 07:38   Ct Head Wo Contrast  Result Date: 03/09/2019 CLINICAL DATA:  85 year old male with headache, neck pain and facial pain and swelling following fall. Initial encounter. EXAM: CT HEAD WITHOUT CONTRAST CT MAXILLOFACIAL WITHOUT CONTRAST CT CERVICAL SPINE WITHOUT CONTRAST TECHNIQUE: Multidetector CT imaging of the head, cervical spine, and maxillofacial structures were performed using the standard protocol without intravenous contrast. Multiplanar CT image reconstructions of the cervical spine and maxillofacial structures were also generated. COMPARISON:  None. FINDINGS: CT HEAD FINDINGS Brain: A 1.2 cm area of hemorrhage along septum pellucidum, a small amount of hemorrhage in the LEFT occipital horn and subarachnoid hemorrhage in the RIGHT aspect of the quadrigeminal cistern noted. Atrophy and chronic small-vessel white matter ischemic changes are present. There is no evidence of midline  shift,, acute infarct, hydrocephalus or subdural/epidural hemorrhage. Vascular: Carotid and vertebral atherosclerotic calcifications noted Skull: Normal. Negative for fracture or focal lesion. Other: None. CT MAXILLOFACIAL FINDINGS Osseous: No fracture or mandibular dislocation. No destructive process. Orbits: Negative. No traumatic or inflammatory finding. Sinuses: Small amount of fluid within the LEFT maxillary sinus is noted. Soft tissues: RIGHT facial soft tissue swelling is noted. CT CERVICAL SPINE FINDINGS Alignment: Normal. Skull base and vertebrae: No acute fracture. No primary bone lesion or focal pathologic process. Soft tissues and spinal canal: No prevertebral fluid or swelling. No visible canal hematoma. Disc levels: Mild multilevel degenerative disc disease/spondylosis and moderate multilevel facet arthropathy noted. Upper chest: No acute abnormality Other: None IMPRESSION: 1. 1.2 cm area of hemorrhage along the septum pellucidum, small amount of hemorrhage in the LEFT occipital horn and subarachnoid hemorrhage in the RIGHT aspect of the quadrigeminal cistern. 2. Atrophy and chronic small-vessel white matter ischemic changes. 3. RIGHT facial soft tissue swelling without fracture. 4. No static evidence of acute injury to the cervical spine. Critical Value/emergent results were called by telephone at the time of interpretation on 03/09/2019 at 9:43 pm to providerDAVID YAO , who verbally acknowledged these results. Electronically Signed   By: Cleatis Polka.D.  On: 03/09/2019 21:45   Ct Cervical Spine Wo Contrast  Result Date: 03/09/2019 CLINICAL DATA:  85 year old male with headache, neck pain and facial pain and swelling following fall. Initial encounter. EXAM: CT HEAD WITHOUT CONTRAST CT MAXILLOFACIAL WITHOUT CONTRAST CT CERVICAL SPINE WITHOUT CONTRAST TECHNIQUE: Multidetector CT imaging of the head, cervical spine, and maxillofacial structures were performed using the standard protocol without  intravenous contrast. Multiplanar CT image reconstructions of the cervical spine and maxillofacial structures were also generated. COMPARISON:  None. FINDINGS: CT HEAD FINDINGS Brain: A 1.2 cm area of hemorrhage along septum pellucidum, a small amount of hemorrhage in the LEFT occipital horn and subarachnoid hemorrhage in the RIGHT aspect of the quadrigeminal cistern noted. Atrophy and chronic small-vessel white matter ischemic changes are present. There is no evidence of midline shift,, acute infarct, hydrocephalus or subdural/epidural hemorrhage. Vascular: Carotid and vertebral atherosclerotic calcifications noted Skull: Normal. Negative for fracture or focal lesion. Other: None. CT MAXILLOFACIAL FINDINGS Osseous: No fracture or mandibular dislocation. No destructive process. Orbits: Negative. No traumatic or inflammatory finding. Sinuses: Small amount of fluid within the LEFT maxillary sinus is noted. Soft tissues: RIGHT facial soft tissue swelling is noted. CT CERVICAL SPINE FINDINGS Alignment: Normal. Skull base and vertebrae: No acute fracture. No primary bone lesion or focal pathologic process. Soft tissues and spinal canal: No prevertebral fluid or swelling. No visible canal hematoma. Disc levels: Mild multilevel degenerative disc disease/spondylosis and moderate multilevel facet arthropathy noted. Upper chest: No acute abnormality Other: None IMPRESSION: 1. 1.2 cm area of hemorrhage along the septum pellucidum, small amount of hemorrhage in the LEFT occipital horn and subarachnoid hemorrhage in the RIGHT aspect of the quadrigeminal cistern. 2. Atrophy and chronic small-vessel white matter ischemic changes. 3. RIGHT facial soft tissue swelling without fracture. 4. No static evidence of acute injury to the cervical spine. Critical Value/emergent results were called by telephone at the time of interpretation on 03/09/2019 at 9:43 pm to providerDAVID YAO , who verbally acknowledged these results. Electronically  Signed   By: Margarette Canada M.D.   On: 03/09/2019 21:45   Dg Pelvis Portable  Result Date: 03/09/2019 CLINICAL DATA:  Initial evaluation for acute trauma, fall. EXAM: PORTABLE PELVIS 1-2 VIEWS COMPARISON:  None. FINDINGS: No acute fracture or dislocation. No pubic diastasis. SI joints approximated. Femoral heads normally position within the acetabula. Mild osteoarthritic changes about the hips bilaterally. Degenerative spondylolysis noted within the visualized lower lumbar spine. No visible soft tissue injury.  Hernia tacks overlie the pelvis. IMPRESSION: No acute osseous abnormality about the pelvis. Electronically Signed   By: Jeannine Boga M.D.   On: 03/09/2019 21:09   Dg Chest Port 1 View  Result Date: 03/09/2019 CLINICAL DATA:  Initial evaluation for acute trauma. EXAM: PORTABLE CHEST 1 VIEW COMPARISON:  Prior radiograph from 10/05/2015. FINDINGS: Allowing for AP technique, cardiac and mediastinal silhouettes are stable in size and contour, and remain within normal limits. Aortic atherosclerosis. Lungs are hypoinflated. Secondary mild diffuse bronchovascular crowding. Mild blunting of the left costophrenic angle suggestive of a small left pleural effusion. Associated mild left basilar subsegmental atelectasis. No other focal airspace disease. No pulmonary edema. No pneumothorax. No acute osseous finding. Degenerative changes noted about the shoulders bilaterally. IMPRESSION: 1. Possible small left pleural effusion with associated mild left basilar atelectasis. 2. No other active cardiopulmonary disease. 3.  Aortic Atherosclerosis (ICD10-I70.0). Electronically Signed   By: Jeannine Boga M.D.   On: 03/09/2019 21:06   Dg Shoulder Right Portable  Result Date: 03/09/2019 CLINICAL  DATA:  Initial evaluation for acute trauma, fall. EXAM: PORTABLE RIGHT SHOULDER COMPARISON:  None. FINDINGS: No acute fracture or dislocation. Humeral head in normal alignment within the glenoid. AC joint  approximated. Mild osteoarthritic changes noted about the right AC joint. No periarticular calcification. Visualized right hemithorax clear. IMPRESSION: No acute osseous abnormality about the right shoulder. Electronically Signed   By: Jeannine Boga M.D.   On: 03/09/2019 21:11   Dg Humerus Right  Result Date: 03/09/2019 CLINICAL DATA:  Initial evaluation for acute trauma, fall. EXAM: RIGHT HUMERUS - 2+ VIEW COMPARISON:  None. FINDINGS: There is no evidence of fracture or other focal bone lesions. Soft tissues are unremarkable. IMPRESSION: No acute osseous abnormality about the right humerus. Electronically Signed   By: Jeannine Boga M.D.   On: 03/09/2019 21:08   Ct Maxillofacial Wo Contrast  Result Date: 03/09/2019 CLINICAL DATA:  85 year old male with headache, neck pain and facial pain and swelling following fall. Initial encounter. EXAM: CT HEAD WITHOUT CONTRAST CT MAXILLOFACIAL WITHOUT CONTRAST CT CERVICAL SPINE WITHOUT CONTRAST TECHNIQUE: Multidetector CT imaging of the head, cervical spine, and maxillofacial structures were performed using the standard protocol without intravenous contrast. Multiplanar CT image reconstructions of the cervical spine and maxillofacial structures were also generated. COMPARISON:  None. FINDINGS: CT HEAD FINDINGS Brain: A 1.2 cm area of hemorrhage along septum pellucidum, a small amount of hemorrhage in the LEFT occipital horn and subarachnoid hemorrhage in the RIGHT aspect of the quadrigeminal cistern noted. Atrophy and chronic small-vessel white matter ischemic changes are present. There is no evidence of midline shift,, acute infarct, hydrocephalus or subdural/epidural hemorrhage. Vascular: Carotid and vertebral atherosclerotic calcifications noted Skull: Normal. Negative for fracture or focal lesion. Other: None. CT MAXILLOFACIAL FINDINGS Osseous: No fracture or mandibular dislocation. No destructive process. Orbits: Negative. No traumatic or  inflammatory finding. Sinuses: Small amount of fluid within the LEFT maxillary sinus is noted. Soft tissues: RIGHT facial soft tissue swelling is noted. CT CERVICAL SPINE FINDINGS Alignment: Normal. Skull base and vertebrae: No acute fracture. No primary bone lesion or focal pathologic process. Soft tissues and spinal canal: No prevertebral fluid or swelling. No visible canal hematoma. Disc levels: Mild multilevel degenerative disc disease/spondylosis and moderate multilevel facet arthropathy noted. Upper chest: No acute abnormality Other: None IMPRESSION: 1. 1.2 cm area of hemorrhage along the septum pellucidum, small amount of hemorrhage in the LEFT occipital horn and subarachnoid hemorrhage in the RIGHT aspect of the quadrigeminal cistern. 2. Atrophy and chronic small-vessel white matter ischemic changes. 3. RIGHT facial soft tissue swelling without fracture. 4. No static evidence of acute injury to the cervical spine. Critical Value/emergent results were called by telephone at the time of interpretation on 03/09/2019 at 9:43 pm to providerDAVID YAO , who verbally acknowledged these results. Electronically Signed   By: Margarette Canada M.D.   On: 03/09/2019 21:45      LOS: 0 days   Time spent: More than 50% of that time was spent in counseling and/or coordination of care.  Antonieta Pert, MD [facility]  03/10/2019, 3:01 PM

## 2019-03-11 ENCOUNTER — Other Ambulatory Visit: Payer: Self-pay

## 2019-03-11 MED ORDER — PANTOPRAZOLE SODIUM 40 MG PO TBEC: 40.0000 mg | DELAYED_RELEASE_TABLET | Freq: Every day | ORAL | Status: DC

## 2019-03-11 NOTE — TOC Transition Note (Addendum)
Transition of Care [facility]) - CM/SW Discharge Note Marvetta Gibbons RN,BSN Transitions of Care Unit 4NP (non trauma) - RN Case Manager [phone]   Patient Details  Name: [name] MRN: [id number] Date of Birth: [birth year]  Transition of Care [facility]) CM/SW Contact:  Dawayne Patricia, RN Phone Number: 03/11/2019, 4:02 PM   Clinical Narrative:    Pt stable for transition home today with daughter, CM spoke with daughter at bedside (pt sleeping) per daughter pt is active with [facility]- list provided Per CMS guidelines from medicare.gov website with star ratings (copy placed in shadow chart)- daughter states they would like to continue services with [facility] and add any additional services needed per MD orders. Orders placed for PT/OT- call made to [facility] with Nanine Means to notify of resumption of care needs.   Final next level of care: Home w Home Health Services Barriers to Discharge: No Barriers Identified   Patient Goals and CMS Choice Patient states their goals for this hospitalization and ongoing recovery are:: return home(per daughter) CMS Medicare.gov Compare Post Acute Care list provided to:: Patient Choice offered to / list presented to : Adult Children(daughter)  Discharge Placement         Home with [facility]              Discharge Plan and Services   Discharge Planning Services: CM Consult Post Acute Care Choice: Durable Medical Equipment, Home Health, Resumption of Svcs/PTA Provider          DME Arranged: N/A DME Agency: NA       HH Arranged: RN, PT, OT HH Agency: [place] Date [place]: 03/11/19 Time [place]: [place] Representative spoke with at [place]: [place] ([place]) Interventions     Readmission Risk Interventions Readmission Risk Prevention Plan 03/11/2019  Post Dischage Appt Complete  Medication Screening Complete  Transportation Screening Complete  Some recent data might be  hidden

## 2019-03-11 NOTE — Discharge Summary (Signed)
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 8760 hours. Basic Metabolic Panel: Recent Labs  Lab 03/09/19 2022 03/09/19 2037 03/10/19 0330  NA 138 139 138  K 3.8 3.7 3.7  CL 102 100 102  CO2 25  --  24  GLUCOSE 106* 96 106*  BUN 23 25* 20  CREATININE 1.29* 1.30* 1.24  CALCIUM 9.5  --  9.0   Liver Function Tests: Recent Labs  Lab 03/09/19 2022  AST 29  ALT 21  ALKPHOS 65  BILITOT 0.7  PROT 6.9  ALBUMIN 3.6   No results for input(s): LIPASE, AMYLASE in the last 168 hours. No results for input(s): AMMONIA in the last 168 hours. CBC: Recent Labs  Lab 03/09/19 2022 03/09/19 2037  WBC 7.8  --   HGB 10.6* 11.9*  HCT 31.6* 35.0*  MCV 99.4  --   PLT 246  --    Cardiac Enzymes: No results for input(s): CKTOTAL, CKMB, CKMBINDEX, TROPONINI in the last 168 hours. BNP: Invalid input(s): POCBNP CBG: No results for input(s): GLUCAP in the last 168 hours. D-Dimer No results for input(s): DDIMER in the last 72 hours. Hgb A1c No results for  input(s): HGBA1C in the last 72 hours. Lipid Profile No results for input(s): CHOL, HDL, LDLCALC, TRIG, CHOLHDL, LDLDIRECT in the last 72 hours. Thyroid function studies No results for input(s): TSH, T4TOTAL, T3FREE, THYROIDAB in the last 72 hours.  Invalid input(s): FREET3 Anemia work up No results for input(s): VITAMINB12, FOLATE, FERRITIN, TIBC, IRON, RETICCTPCT in the last 72 hours. Urinalysis    Component Value Date/Time   COLORURINE STRAW (A) 03/09/2019 2042   APPEARANCEUR CLEAR 03/09/2019 2042   LABSPEC 1.012 03/09/2019 2042   PHURINE 6.0 03/09/2019 2042   GLUCOSEU NEGATIVE 03/09/2019 2042   HGBUR NEGATIVE 03/09/2019 2042   HGBUR negative 03/17/2010 1010   BILIRUBINUR NEGATIVE 03/09/2019 2042   KETONESUR NEGATIVE 03/09/2019 2042   PROTEINUR NEGATIVE 03/09/2019 2042   UROBILINOGEN 0.2 03/17/2010 1010   NITRITE NEGATIVE 03/09/2019 2042   LEUKOCYTESUR NEGATIVE 03/09/2019 2042   Sepsis Labs Invalid input(s): PROCALCITONIN,  WBC,  [place] [facility] Recent Results (from the past 240 hour(s))  SARS CORONAVIRUS 2 (TAT 6-24 HRS) Nasopharyngeal Nasopharyngeal Swab     Status: None   Collection Time: 03/10/19  3:21 AM   Specimen: Nasopharyngeal Swab  Result Value Ref Range Status   SARS Coronavirus 2 NEGATIVE NEGATIVE Final    Comment: (NOTE) SARS-CoV-2 target nucleic acids are NOT DETECTED. The SARS-CoV-2 RNA is generally detectable in upper and lower respiratory specimens during the acute phase of infection. Negative results do not preclude SARS-CoV-2 infection, do not rule out co-infections with other pathogens, and should not be used as the sole basis for treatment or other patient management decisions. Negative results must be combined with clinical observations, patient history, and epidemiological information. The expected result is Negative. Fact Sheet for Patients: SugarRoll[employer]be Fact Sheet for Healthcare  Providers: https://www.woods-mathews.com/ This test is not yet approved or cleared by the [place] FDA and  has been authorized for detection and/or diagnosis of SARS-CoV-2 by FDA under an Emergency Use Authorization (EUA). This EUA will remain  in effect (meaning this test can be used) for the duration of the COVID-19 declaration under Section 56 4(b)(1) of the Act, 21 U.S.C. section 360bbb-3(b)(1), unless the authorization is terminated or revoked sooner. Performed at [facility], [place] [address], [place], [place] [address]   Surgical PCR screen     Status: Abnormal   Collection Time: 03/10/19  4:18 AM   Specimen: Nasal Mucosa; Nasal Swab  Result Value Ref Range Status   MRSA, PCR NEGATIVE NEGATIVE Final   Staphylococcus aureus POSITIVE (A) NEGATIVE Final    Comment: (NOTE) The Xpert SA Assay (FDA approved for NASAL specimens in patients 15 years of age and older), is one component of a comprehensive surveillance program. It is not intended to diagnose infection nor to guide or monitor treatment. Performed at [facility], [place] [address] [place], [place] [address]      Time coordinating discharge: 35 minutes  SIGNED:   Antonieta Pert, MD  [facility] 03/11/2019, 3:27 PM  If 7PM-7AM, please contact night-coverage [email or web address]

## 2019-03-26 ENCOUNTER — Telehealth (HOSPITAL_COMMUNITY): Payer: Self-pay

## 2019-03-26 ENCOUNTER — Other Ambulatory Visit (HOSPITAL_COMMUNITY): Payer: Self-pay | Admitting: Interventional Radiology

## 2019-03-26 DIAGNOSIS — I771 Stricture of artery: Secondary | ICD-10-CM

## 2019-03-26 NOTE — Telephone Encounter (Signed)
-----   Message from Ronney Lion, [place] sent at 03/12/2019  2:14 PM EDT ----- Lia Foyer,  I spoke with Dr. Estanislado Pandy about this. He would like to see patient in consult prior to CTA, however he would not like to see patient until AFTER he saw neurosurgery for F/U (as they will probably repeat CT head and make recommendations regarding DAPT initiation). Please schedule and please let me know if you have any questions.  Thanks! Ally ----- Message ----- From: Danielle Dess Sent: 03/12/2019   9:18 AM EDT To: Ronney Lion, PA-C  Ally,   Pt just got discharged from the hospital. The nurse navigator [place] called this morning wanting [place] to get Mr. Vallee in for f/u. She said that his Kary Kos has been put on hold for 2 weeks until he has f/u. Do you want me to schedule a consult or schedule his CTA head/neck? Please advise.   Thanks,  Lia Foyer

## 2019-03-28 ENCOUNTER — Ambulatory Visit (HOSPITAL_COMMUNITY): Admission: RE | Admit: 2019-03-28 | Payer: Medicare Other | Source: Ambulatory Visit

## 2019-04-03 ENCOUNTER — Ambulatory Visit (HOSPITAL_COMMUNITY)
Admission: RE | Admit: 2019-04-03 | Discharge: 2019-04-03 | Disposition: A | Discharge status: Home / Self Care | Payer: Medicare Other | Source: Ambulatory Visit | Attending: Neurosurgery | Admitting: Neurosurgery

## 2019-04-03 ENCOUNTER — Other Ambulatory Visit: Payer: Self-pay

## 2019-04-03 ENCOUNTER — Other Ambulatory Visit (HOSPITAL_COMMUNITY): Payer: Self-pay | Admitting: Neurosurgery

## 2019-04-03 ENCOUNTER — Ambulatory Visit (HOSPITAL_COMMUNITY)
Admission: RE | Admit: 2019-04-03 | Discharge: 2019-04-03 | Disposition: A | Discharge status: Home / Self Care | Payer: Medicare Other | Source: Ambulatory Visit | Attending: Interventional Radiology | Admitting: Interventional Radiology

## 2019-04-03 DIAGNOSIS — S065X9A Traumatic subdural hemorrhage with loss of consciousness of unspecified duration, initial encounter: Secondary | ICD-10-CM | POA: Insufficient documentation

## 2019-04-03 DIAGNOSIS — S065XAA Traumatic subdural hemorrhage with loss of consciousness status unknown, initial encounter: Secondary | ICD-10-CM

## 2019-04-03 DIAGNOSIS — I771 Stricture of artery: Secondary | ICD-10-CM

## 2019-04-03 NOTE — Progress Notes (Signed)
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 Estanislado Pandy and recent fall with resultant traumatic ICH (03/09/19) who presents today to discuss several new concerning symptoms as detailed in HPI as well as resuming antiplatelet therapy.  Dr. Estanislado Pandy, patient's daughter and [place] translator was present for follow up appointment.  Patient with new onset intermittent diplopia, blurry vision, urinary incontinence, word finding difficulties and worsening confusion since fall resulting in [place] on 03/09/19. As per HPI he was lost to follow up briefly this year and stopped taking his Brilinta + ASA, this was restarted on 03/06/19 when he was seen for routine follow up, however he unfortunately had a fall at home which resulted in a traumatic ICH. Brilinta and ASA was held and he was given DDAVP. He was discharged on 10/20 with instructions to continue to hold antiplatelet therapy until he was cleared by [facility] and neurology. Discussed with patient and his daughter today that he may resume ASA 81 mg QD only after his CT head w/o contrast (performed today) is reviewed by neurology and is cleared by their service. He will not need to  resume Brilinta at this current time. Per patient's daughter he has a neurology appointment coming up soon. They state understanding and agree to  this plan.  Additionally we discussed that it is not uncommon to have some disorientation/speech issues following a traumatic ICH and this should gradually improve over time. We also discussed that it is unclear what the etiology is of his inconsistent double/blurry vision and recommend a consultation with an ophthalmologist to further explore this issue.   Plan for follow-up:  1. Restart ASA 81 mg QD once cleared by neurology service 2. Ophthalomology consultation for inconsistent double/blurry vision 3. Urology consult for urinary incontinence (per daughter scheduled with urology for next week) 4. Orthopedics consult for right shoulder pain (per daughter scheduled for steroid injection with orthopedics) 5. Continue home PT/OT as ordered 6. Follow up with NIR in 6 months or earlier if symptomatic.  7. Present to ED with any concerning neurologic symptoms.  Informed patient that our schedulers will call patient with appointment date and time for 6 month follow up.   All questions answered and concerns addressed. Patient and daughter convey understanding and agrees with plan.  Thank you for this interesting consult.  I greatly enjoyed meeting Justyce Merfeld and look forward to participating in their care.  A copy of this report was sent to the requesting provider on this date.  Electronically Signed: Joaquim Nam, PA-C 04/03/2019, 11:06 AM   I spent a total of40 Minutes in face to face in clinical consultation, greater than 50% of which was counseling/coordinating care for follow up traumatic ICH.

## 2019-04-10 ENCOUNTER — Ambulatory Visit: Payer: Medicare Other | Admitting: Orthopedic Surgery

## 2019-04-10 ENCOUNTER — Other Ambulatory Visit: Payer: Self-pay

## 2019-04-10 DIAGNOSIS — M12811 Other specific arthropathies, not elsewhere classified, right shoulder: Secondary | ICD-10-CM

## 2019-04-10 DIAGNOSIS — M5418 Radiculopathy, sacral and sacrococcygeal region: Secondary | ICD-10-CM

## 2019-04-11 ENCOUNTER — Encounter: Payer: Self-pay | Admitting: Orthopedic Surgery
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
## 2019-04-12 ENCOUNTER — Encounter: Payer: Self-pay | Admitting: Orthopedic Surgery

## 2019-04-12 DIAGNOSIS — M12811 Other specific arthropathies, not elsewhere classified, right shoulder: Secondary | ICD-10-CM

## 2019-04-12 MED ORDER — LIDOCAINE HCL 1 % IJ SOLN
5.0000 mL | INTRAMUSCULAR | Status: AC | PRN
  Administered 2019-04-12: 5 mL

## 2019-04-12 MED ORDER — METHYLPREDNISOLONE ACETATE 40 MG/ML IJ SUSP
40.0000 mg | INTRAMUSCULAR | Status: AC | PRN
  Administered 2019-04-12: 40 mg via INTRA_ARTICULAR

## 2019-04-12 MED ORDER — BUPIVACAINE HCL 0.5 % IJ SOLN
9.0000 mL | INTRAMUSCULAR | Status: AC | PRN
  Administered 2019-04-12: 9 mL via INTRA_ARTICULAR

## 2019-04-19 ENCOUNTER — Other Ambulatory Visit: Payer: Self-pay

## 2019-04-19 ENCOUNTER — Ambulatory Visit (HOSPITAL_BASED_OUTPATIENT_CLINIC_OR_DEPARTMENT_OTHER)
Admission: RE | Admit: 2019-04-19 | Discharge: 2019-04-19 | Disposition: A | Discharge status: Home / Self Care | Payer: Medicare Other | Source: Ambulatory Visit | Attending: Orthopedic Surgery | Admitting: Orthopedic Surgery

## 2019-04-19 DIAGNOSIS — M5418 Radiculopathy, sacral and sacrococcygeal region: Secondary | ICD-10-CM | POA: Insufficient documentation

## 2019-04-23 ENCOUNTER — Ambulatory Visit: Payer: Medicare Other | Admitting: Physician Assistant

## 2019-04-23 ENCOUNTER — Ambulatory Visit: Payer: Medicare Other | Admitting: Orthopedic Surgery

## 2019-04-25 ENCOUNTER — Ambulatory Visit (INDEPENDENT_AMBULATORY_CARE_PROVIDER_SITE_OTHER): Payer: Medicare Other | Admitting: Orthopedic Surgery

## 2019-04-25 ENCOUNTER — Other Ambulatory Visit: Payer: Self-pay

## 2019-04-25 DIAGNOSIS — M12811 Other specific arthropathies, not elsewhere classified, right shoulder: Secondary | ICD-10-CM | POA: Diagnosis not present

## 2019-04-25 DIAGNOSIS — M545 Low back pain, unspecified: Secondary | ICD-10-CM

## 2019-04-26 ENCOUNTER — Encounter: Payer: Self-pay | Admitting: Orthopedic Surgery

## 2019-04-26 NOTE — Progress Notes (Signed)
Office Visit Note   Patient: [name]           Date of Birth: 10-Jun-1928           MRN: [id number] Visit Date: 04/25/2019 Requested by: Kristopher Glee., MD [id number] [place],  [place] [address] PCP: Kristopher Glee., MD  Subjective: Chief Complaint  Patient presents with   Follow-up    HPI: Rodriques presents for follow-up of lumbar spine MRI scan.  Injection did help his right shoulder but it is wearing off some.  States that his shoulder hurts him more than his back.  He would like to have another shot in the right shoulder but it is a little bit too soon for that.  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  Also right foraminal impingement at this level.  Advanced spinal stenosis L4-5 is present as well.  His shoulder has definite rotator cuff tearing.  Plan at this time is to refer to [facility] Orth[facility] care for some shoulder physical therapy.  I think in general his daughter would like for him to try this before any type of surgical intervention which is completely reasonable.  Refer to Dr. Ernestina Patches for L spine injection to evaluate right radiculopathy.  I will see him back in 6 weeks and we could consider another shoulder injection at that time.              ROS: All systems reviewed are negative as they relate to the chief complaint within the history of present illness.  Patient denies  fevers or chills.   Assessment & Plan: Visit Diagnoses:  1. Low back pain, unspecified back pain laterality, unspecified chronicity, unspecified whether sciatica present   2. Rotator cuff arthropathy of right shoulder     Plan: Impression is right shoulder rotator cuff tear.  Plan physical therapy for that.  See HPI for further discussion.  Regarding the back he has right-sided radiculopathy and needs shot for that.  Follow-up in 6 weeks  for consideration of another shot in the right shoulder.  Follow-Up Instructions: Return in about 6 weeks (around 06/06/2019).   Orders:  Orders Placed This Encounter  Procedures   Ambulatory referral to Physical Therapy   Ambulatory referral to Physical Medicine Rehab   No orders of the defined types were placed in this encounter.     Procedures: No procedures performed   Clinical Data: No additional findings.  Objective: Vital Signs: There were no vitals taken for this visit.  Physical Exam:   Constitutional: Patient appears well-developed HEENT:  Head: Normocephalic Eyes:EOM are normal Neck: Normal range of motion Cardiovascular: Normal rate Pulmonary/chest: Effort normal Neurologic: Patient is alert Skin: Skin is warm Psychiatric: Patient has normal mood and affect    Ortho Exam: Ortho exam demonstrates full active and passive range of motion of bilateral ankles feet and hips.  Pedal pulses palpable.  No groin pain with internal X rotation of the leg.  No other masses lymphadenopathy or skin changes noted in that back region.  No muscle atrophy in the legs.  Right shoulder has improving passive range of motion and improving active range of motion but still not quite where he needs to be in terms of forward flexion and abduction above 90 degrees.  Coarse grinding is present.  Motor or sensory function to the hand  is intact.  Specialty Comments:  No specialty comments available.  Imaging: No results found.   PMFS History: Patient Active Problem List   Diagnosis Date Noted   ICH (intracerebral hemorrhage) ([place]) 03/10/2019   Basilar artery stenosis with infarction ([place]) 12/18/2017   TIA (transient ischemic attack) 12/10/2017   Rheumatoid arthritis ([place])    Chest pain 06/02/2011   General medical examination 04/11/2011   PEPTIC ULCER DISEASE, HELICOBACTER PYLORI POSITIVE 07/12/2010   PERSONAL HISTORY MALIG NEOPLASM LARGE INTESTINE 05/18/2010    MITRAL VALVE DISORDERS 04/05/2009   HOARSENESS 12/04/2008   OSTEOPENIA 08/26/2008   HYPERTRIGLYCERIDEMIA 07/23/2008   EPISTAXIS 07/26/2007   ERECTILE DYSFUNCTION 02/22/2007   ECZEMA 12/07/2006   LEG CRAMPS 10/09/2006   Hypothyroidism 08/28/2006   Essential hypertension 08/28/2006   HAY FEVER 08/28/2006   INSOMNIA 08/28/2006   Past Medical History:  Diagnosis Date   Adenocarcinoma ([place]) 1985   colon s/p right hemicolectomy and chemotherapy. F/U with cancer center and Dr. Lucia Gaskins rotinely   Depression    Eczema    Epistaxis    f/u per ENT   Erectile dysfunction    Gastric ulcer 12/11   H-Pylori Tx, EGD 3-12: gastritis   Hay fever    Head injury 11/2017   with fall   Hyperlipemia    Hypertension    Hypothyroidism    Hypothyroid   Insomnia    Osteopenia     per DEXA 07-2008 (Rx fosamax)   Rheumatoid arthritis ([place])    Stroke ([place])     " balance issue"    Family History  Problem Relation Age of Onset   Diabetes Neg Hx    Heart attack Neg Hx    Colon cancer Neg Hx    Prostate cancer Neg Hx     Past Surgical History:  Procedure Laterality Date   COLON SURGERY     Right / Adenocarcinoma / Chemo   COLONOSCOPY  2005, 2008   Dr. Lucia Gaskins   INGUINAL HERNIA REPAIR     Right   IR ANGIO INTRA EXTRACRAN SEL COM CAROTID INNOMINATE BILAT MOD SED  12/13/2017   IR ANGIO VERTEBRAL SEL VERTEBRAL BILAT MOD SED  12/13/2017   IR PTA INTRACRANIAL  12/18/2017   RADIOLOGY WITH ANESTHESIA N/A 12/18/2017   Procedure: Angioplasty with stenting;  Surgeon: Luanne Bras, MD;  Location: [facility];  Service: Radiology;  Laterality: N/A;   Social History   Occupational History    Employer: RETIRED  Tobacco Use   Smoking status: Never Smoker   Smokeless tobacco: Never Used  Substance and Sexual Activity   Alcohol use: No    Alcohol/week: 0.0 standard drinks   Drug use: No   Sexual activity: Not on file

## 2019-05-27 ENCOUNTER — Ambulatory Visit: Payer: Medicare Other | Admitting: Family Medicine

## 2019-05-29 ENCOUNTER — Other Ambulatory Visit: Payer: Self-pay | Admitting: Student

## 2019-05-29 DIAGNOSIS — S065X9A Traumatic subdural hemorrhage with loss of consciousness of unspecified duration, initial encounter: Secondary | ICD-10-CM

## 2019-05-29 DIAGNOSIS — S065XAA Traumatic subdural hemorrhage with loss of consciousness status unknown, initial encounter: Secondary | ICD-10-CM

## 2019-05-31 ENCOUNTER — Ambulatory Visit: Payer: Medicare Other

## 2019-05-31 ENCOUNTER — Ambulatory Visit: Payer: Medicare Other | Attending: Internal Medicine

## 2019-05-31 DIAGNOSIS — Z23 Encounter for immunization: Secondary | ICD-10-CM

## 2019-05-31 NOTE — Progress Notes (Signed)
   Covid-19 Vaccination Clinic  Name:  Shylo Persing    MRN: [id number] DOB: 1929/01/05  05/31/2019  Mr. Basaldua was observed post Covid-19 immunization for 15 minutes without incidence. He was provided with Vaccine Information Sheet and instruction to access the V-Safe system.   Mr. Hout was instructed to call 911 with any severe reactions post vaccine: Marland Kitchen Difficulty breathing  . Swelling of your face and throat  . A fast heartbeat  . A bad rash all over your body  . Dizziness and weakness    Immunizations Administered    Name Date Dose VIS Date Route   Pfizer COVID-19 Vaccine 05/31/2019  2:44 PM 0.3 mL 05/02/2019 Intramuscular   Manufacturer: [place]   Lot: [id number]   [place]: [id number]

## 2019-06-18 ENCOUNTER — Ambulatory Visit: Payer: Medicare Other

## 2019-06-21 ENCOUNTER — Ambulatory Visit: Payer: Medicare Other | Attending: Internal Medicine

## 2019-06-21 DIAGNOSIS — Z23 Encounter for immunization: Secondary | ICD-10-CM | POA: Insufficient documentation

## 2019-06-21 NOTE — Progress Notes (Signed)
   Covid-19 Vaccination Clinic  Name:  [name]    MRN: [id number] DOB: [birth year]  06/21/2019  [name] was observed post Covid-19 immunization for 15 minutes without incidence. He was provided with Vaccine Information Sheet and instruction to access the V-Safe system.   [name] was instructed to call 911 with any severe reactions post vaccine: Marland Kitchen Difficulty breathing  . Swelling of your face and throat  . A fast heartbeat  . A bad rash all over your body  . Dizziness and weakness    Immunizations Administered    Name Date Dose VIS Date Route   Pfizer COVID-19 Vaccine 06/21/2019  2:52 PM 0.3 mL 05/02/2019 Intramuscular   Manufacturer: [place]   Lot: [id number]   [place]: [id number]

## 2019-06-30 ENCOUNTER — Ambulatory Visit: Payer: Medicare Other | Admitting: Family Medicine

## 2019-07-24 ENCOUNTER — Ambulatory Visit: Payer: Medicare Other | Admitting: Family Medicine

## 2019-07-28 ENCOUNTER — Other Ambulatory Visit: Payer: Self-pay

## 2019-07-28 ENCOUNTER — Ambulatory Visit (INDEPENDENT_AMBULATORY_CARE_PROVIDER_SITE_OTHER): Payer: Medicare Other | Admitting: Family Medicine

## 2019-07-28 ENCOUNTER — Encounter: Payer: Self-pay | Admitting: Family Medicine

## 2019-07-28 VITALS — BP 120/60 | HR 61 | Temp 97.1°F | Resp 18 | Ht 60.68 in

## 2019-07-28 DIAGNOSIS — R2689 Other abnormalities of gait and mobility: Secondary | ICD-10-CM

## 2019-07-28 DIAGNOSIS — R1310 Dysphagia, unspecified: Secondary | ICD-10-CM | POA: Insufficient documentation

## 2019-07-28 DIAGNOSIS — I10 Essential (primary) hypertension: Secondary | ICD-10-CM

## 2019-07-28 DIAGNOSIS — E039 Hypothyroidism, unspecified: Secondary | ICD-10-CM

## 2019-07-28 DIAGNOSIS — G8929 Other chronic pain: Secondary | ICD-10-CM

## 2019-07-28 DIAGNOSIS — R296 Repeated falls: Secondary | ICD-10-CM | POA: Diagnosis not present

## 2019-07-28 DIAGNOSIS — R040 Epistaxis: Secondary | ICD-10-CM

## 2019-07-28 DIAGNOSIS — M069 Rheumatoid arthritis, unspecified: Secondary | ICD-10-CM

## 2019-07-28 DIAGNOSIS — E781 Pure hyperglyceridemia: Secondary | ICD-10-CM

## 2019-07-28 DIAGNOSIS — M25511 Pain in right shoulder: Secondary | ICD-10-CM | POA: Diagnosis not present

## 2019-07-28 DIAGNOSIS — R413 Other amnesia: Secondary | ICD-10-CM

## 2019-07-28 MED ORDER — PANTOPRAZOLE SODIUM 40 MG PO TBEC: 40.0000 mg | DELAYED_RELEASE_TABLET | Freq: Every day | ORAL | 3 refills | Status: DC

## 2019-07-28 NOTE — Assessment & Plan Note (Signed)
Well controlled, no changes to meds. Encouraged heart healthy diet such as the DASH diet and exercise as tolerated.  °

## 2019-07-28 NOTE — Assessment & Plan Note (Addendum)
Worsening over last several months Pt has hx SAH F/u with neuro

## 2019-07-28 NOTE — Assessment & Plan Note (Signed)
On methotrexate May need rheum referral

## 2019-07-28 NOTE — Assessment & Plan Note (Signed)
Will get lab results from labs recently done

## 2019-07-28 NOTE — Assessment & Plan Note (Signed)
con't meds Check labs results

## 2019-07-28 NOTE — Patient Instructions (Addendum)
Please make sure we get a copy of the lab work you recently had done     Fall Prevention in the Home, Adult Falls can cause injuries. They can happen to people of all ages. There are many things you can do to make your home safe and to help prevent falls. Ask for help when making these changes, if needed. What actions can I take to prevent falls? General Instructions  Use good lighting in all rooms. Replace any light bulbs that burn out.  Turn on the lights when you go into a dark area. Use night-lights.  Keep items that you use often in easy-to-reach places. Lower the shelves around your home if necessary.  Set up your furniture so you have a clear path. Avoid moving your furniture around.  Do not have throw rugs and other things on the floor that can make you trip.  Avoid walking on wet floors.  If any of your floors are uneven, fix them.  Add color or contrast paint or tape to clearly mark and help you see: ? Any grab bars or handrails. ? First and last steps of stairways. ? Where the edge of each step is.  If you use a stepladder: ? Make sure that it is fully opened. Do not climb a closed stepladder. ? Make sure that both sides of the stepladder are locked into place. ? Ask someone to hold the stepladder for you while you use it.  If there are any pets around you, be aware of where they are. What can I do in the bathroom?      Keep the floor dry. Clean up any water that spills onto the floor as soon as it happens.  Remove soap buildup in the tub or shower regularly.  Use non-skid mats or decals on the floor of the tub or shower.  Attach bath mats securely with double-sided, non-slip rug tape.  If you need to sit down in the shower, use a plastic, non-slip stool.  Install grab bars by the toilet and in the tub and shower. Do not use towel bars as grab bars. What can I do in the bedroom?  Make sure that you have a light by your bed that is easy to  reach.  Do not use any sheets or blankets that are too big for your bed. They should not hang down onto the floor.  Have a firm chair that has side arms. You can use this for support while you get dressed. What can I do in the kitchen?  Clean up any spills right away.  If you need to reach something above you, use a strong step stool that has a grab bar.  Keep electrical cords out of the way.  Do not use floor polish or wax that makes floors slippery. If you must use wax, use non-skid floor wax. What can I do with my stairs?  Do not leave any items on the stairs.  Make sure that you have a light switch at the top of the stairs and the bottom of the stairs. If you do not have them, ask someone to add them for you.  Make sure that there are handrails on both sides of the stairs, and use them. Fix handrails that are broken or loose. Make sure that handrails are as long as the stairways.  Install non-slip stair treads on all stairs in your home.  Avoid having throw rugs at the top or  bottom of the stairs. If you do have throw rugs, attach them to the floor with carpet tape.  Choose a carpet that does not hide the edge of the steps on the stairway.  Check any carpeting to make sure that it is firmly attached to the stairs. Fix any carpet that is loose or worn. What can I do on the outside of my home?  Use bright outdoor lighting.  Regularly fix the edges of walkways and driveways and fix any cracks.  Remove anything that might make you trip as you walk through a door, such as a raised step or threshold.  Trim any bushes or trees on the path to your home.  Regularly check to see if handrails are loose or broken. Make sure that both sides of any steps have handrails.  Install guardrails along the edges of any raised decks and porches.  Clear walking paths of anything that might make someone trip, such as tools or rocks.  Have any leaves, snow, or ice cleared regularly.  Use  sand or salt on walking paths during winter.  Clean up any spills in your garage right away. This includes grease or oil spills. What other actions can I take?  Wear shoes that: ? Have a low heel. Do not wear high heels. ? Have rubber bottoms. ? Are comfortable and fit you well. ? Are closed at the toe. Do not wear open-toe sandals.  Use tools that help you move around (mobility aids) if they are needed. These include: ? Canes. ? Walkers. ? Scooters. ? Crutches.  Review your medicines with your doctor. Some medicines can make you feel dizzy. This can increase your chance of falling. Ask your doctor what other things you can do to help prevent falls. Where to find more information  Centers for Disease Control and Prevention, STEADI: https://garcia.biz/  [employer] on Aging: BrainJudge.co.uk Contact a doctor if:  You are afraid of falling at home.  You feel weak, drowsy, or dizzy at home.  You fall at home. Summary  There are many simple things that you can do to make your home safe and to help prevent falls.  Ways to make your home safe include removing tripping hazards and installing grab bars in the bathroom.  Ask for help when making these changes in your home. This information is not intended to replace advice given to you by your health care provider. Make sure you discuss any questions you have with your health care provider. Document Revised: 08/29/2018 Document Reviewed: 12/21/2016 Elsevier Patient Education  2020 [employer].

## 2019-07-28 NOTE — Progress Notes (Signed)
Patient ID: [name], male    DOB: 12-Nov-1928  Age: 86 y.o. MRN: [id number]    Subjective:  Subjective  HPI [name] presents for to establish and c/o R shoulder pain.  He has seen Dr Marlou Sa for this and has fallen 2 x in the last year and that last fall was Nov 2020--- no injury but he had to push himself up and the shoulder became worse  He also c/o coughing and burning with eating esp spicy food.  Pt was also waiting for ent referral from previous pcp for nose bleeds Pt with hx RA, htn, hypothyroid , hyperlipidemia  Review of Systems  Constitutional: Negative for appetite change, diaphoresis, fatigue and unexpected weight change.  Eyes: Negative for pain, redness and visual disturbance.  Respiratory: Negative for cough, chest tightness, shortness of breath and wheezing.   Cardiovascular: Negative for chest pain, palpitations and leg swelling.  Endocrine: Negative for cold intolerance, heat intolerance, polydipsia, polyphagia and polyuria.  Genitourinary: Negative for difficulty urinating, dysuria and frequency.  Neurological: Negative for dizziness, light-headedness, numbness and headaches.    History Past Medical History:  Diagnosis Date  . 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 F/U with cancer center and Dr. Lucia Gaskins rotinely  . Depression   . Eczema   . Epistaxis    f/u per ENT  . Erectile dysfunction   . Gastric ulcer 12/11   H-Pylori Tx, EGD 3-12: gastritis  . Hay fever   . Head injury 11/2017   with fall  . Hyperlipemia   . Hypertension   . Hypothyroidism    Hypothyroid  . Insomnia   . Osteopenia     per DEXA 07-2008 (Rx fosamax)  . Rheumatoid arthritis ([place])   . Stroke [facility])     " balance issue"    He has a past surgical history that includes Colonoscopy (2005, 2008); Inguinal hernia repair; Colon surgery; IR ANGIO VERTEBRAL SEL VERTEBRAL BILAT MOD SED (12/13/2017); IR ANGIO INTRA EXTRACRAN SEL COM CAROTID INNOMINATE BILAT  MOD SED (12/13/2017); Radiology with anesthesia (N/A, 12/18/2017); and IR PTA Intracranial (12/18/2017).   His family history is not on file.He reports that he has never smoked. He has never used smokeless tobacco. He reports that he does not drink alcohol or use drugs.  Current Outpatient Medications on File Prior to Visit  Medication Sig Dispense Refill  . Dextran 70-Hypromellose, PF, ([facility] NATURAL TEARS) 0.1-0.3 % SOLN Place 1 drop into both eyes at bedtime.    . fluticasone (FLONASE) 50 MCG/ACT nasal spray Place 2 sprays into the nose daily as needed (seasonal allergies).     . folic acid (FOLVITE) 1 MG tablet Take 1 mg by mouth every morning.    Marland Kitchen gemfibrozil (LOPID) 600 MG tablet Take 0.5 tablets (300 mg total) by mouth 2 (two) times daily before a meal. 60 tablet 0  . Glucosamine HCl (GLUCOSAMINE PO) Take 1 tablet by mouth every morning.    Marland Kitchen levothyroxine (SYNTHROID) 112 MCG tablet Take 112 mcg by mouth daily before breakfast.    . losartan-hydrochlorothiazide (HYZAAR) 50-12.5 MG per tablet TAKE ONE TABLET BY MOUTH ONE TIME DAILY (Patient taking differently: Take 1 tablet by mouth every morning. ) 30 tablet 0  . methotrexate (RHEUMATREX) 2.5 MG tablet Take 15 mg by mouth every Saturday.     . Multiple Vitamin (MULTIVITAMIN WITH MINERALS) TABS tablet Take 1 tablet by mouth every morning.    . Vitamin D, Ergocalciferol, (DRISDOL) 50000 units CAPS  capsule Take 50,000 Units by mouth every Saturday.      No current facility-administered medications on file prior to visit.     Objective:  Objective  Physical Exam Vitals and nursing note reviewed.  Constitutional:      General: He is sleeping.     Appearance: He is well-developed.  HENT:     Head: Normocephalic and atraumatic.  Eyes:     Pupils: Pupils are equal, round, and reactive to light.  Neck:     Thyroid: No thyromegaly.  Cardiovascular:     Rate and Rhythm: Normal rate and regular rhythm.     Heart sounds: No murmur.    Pulmonary:     Effort: Pulmonary effort is normal. No respiratory distress.     Breath sounds: Normal breath sounds. No wheezing or rales.  Chest:     Chest wall: No tenderness.  Musculoskeletal:        General: No tenderness.     Cervical back: Normal range of motion and neck supple.     Comments: Tr pitting edema LLE   Skin:    General: Skin is warm and dry.  Neurological:     Mental Status: He is oriented to person, place, and time.     Motor: Weakness present.     Comments: 3/5 weakness L hip flexor otherwise normal strength in low ext   Psychiatric:        Behavior: Behavior normal.        Thought Content: Thought content normal.        Judgment: Judgment normal.    BP 120/60 (BP Location: Left Arm, Patient Position: Sitting, Cuff Size: Normal)   Pulse 61   Temp (!) 97.1 F (36.2 C) (Temporal)   Resp 18   Ht 5' 0.68" (1.541 m)   SpO2 91%   BMI 25.59 kg/m  Wt Readings from Last 3 Encounters:  03/11/19 134 lb 0.6 oz (60.8 kg)  02/27/19 131 lb 12.8 oz (59.8 kg)  03/05/18 122 lb 9.6 oz (55.6 kg)     Lab Results  Component Value Date   WBC 7.8 03/09/2019   HGB 11.9 (L) 03/09/2019   HCT 35.0 (L) 03/09/2019   PLT 246 03/09/2019   GLUCOSE 106 (H) 03/10/2019   CHOL 155 12/11/2017   TRIG 359 (H) 12/11/2017   HDL 34 (L) 12/11/2017   LDLDIRECT 74.9 04/11/2011   LDLCALC 49 12/11/2017   ALT 21 03/09/2019   AST 29 03/09/2019   NA 138 03/10/2019   K 3.7 03/10/2019   CL 102 03/10/2019   CREATININE 1.24 03/10/2019   BUN 20 03/10/2019   CO2 24 03/10/2019   TSH 0.58 07/27/2011   PSA 3.76 04/11/2011   INR 1.0 03/09/2019   HGBA1C 6.3 (H) 12/11/2017    MR Lumbar Spine w/o contrast  Result Date: 04/19/2019 CLINICAL DATA:  Four months of low back pain radiating to the right leg EXAM: MRI LUMBAR SPINE WITHOUT CONTRAST TECHNIQUE: Multiplanar, multisequence MR imaging of the lumbar spine was performed. No intravenous contrast was administered. COMPARISON:  Lumbar  radiography 01/15/2019 FINDINGS: Segmentation:  5 lumbar type vertebrae Alignment: Grade 1 anterolisthesis at L3-4 and L4-5. Mild levoscoliosis Vertebrae:  No fracture, evidence of discitis, or bone lesion. Conus medullaris and cauda equina: Conus extends to the L1-2 level. Conus and cauda equina appear normal. Paraspinal and other soft tissues: Right renal cystic intensities. Disc levels: T12- L1: Unremarkable. L1-L2: Unremarkable. L2-L3: Mild spondylosis and minimal annulus bulging L3-L4: Advanced facet  arthropathy with spurring, joint distortion with anterolisthesis. There is a degenerative thick wall ganglion in the posterior midline measuring 1 cm and causing high-grade spinal stenosis. The disc is narrowed and bulging, asymmetric to the right where there is moderate foraminal impingement. Left foraminal narrowing is mild. L4-L5: Advanced facet arthropathy with spurring and anterolisthesis. The disc is narrowed and bulging with annular fissures. Advanced and compressive spinal stenosis. Moderate right foraminal narrowing. L5-S1:Unremarkable. IMPRESSION: 1. L3-4 and L4-5 advanced facet arthropathy with anterolisthesis and focal disc degeneration. 2. L3-4 high-grade spinal stenosis primarily related to a degenerative ligamentum flavum ganglion. There is also right foraminal impingement 3. L4-5 advanced spinal stenosis. Moderate right foraminal narrowing. 4. Mild levoscoliosis. Electronically Signed   By: Monte Fantasia M.D.   On: 04/19/2019 20:08     Assessment & Plan:  Plan  I have discontinued [name]'s omeprazole. I am also having him start on pantoprazole. Additionally, I am having him maintain his fluticasone, losartan-hydrochlorothiazide, Vitamin D (Ergocalciferol), gemfibrozil, methotrexate, levothyroxine, [facility] Natural Tears, multivitamin with minerals, Glucosamine HCl (GLUCOSAMINE PO), and folic acid.  Meds ordered this encounter  Medications  . pantoprazole (PROTONIX) 40 MG tablet    Sig:  Take 1 tablet (40 mg total) by mouth daily.    Dispense:  30 tablet    Refill:  3    Problem List Items Addressed This Visit      Unprioritized   Balance problem   Relevant Orders   Ambulatory referral to [place]   Chronic right shoulder pain   Relevant Orders   Ambulatory referral to Orthopedic Surgery   Dysphagia   Relevant Medications   pantoprazole (PROTONIX) 40 MG tablet   Other Relevant Orders   Ambulatory referral to Gastroenterology   EPISTAXIS   Relevant Orders   Ambulatory referral to ENT   Essential hypertension (Chronic)    Well controlled, no changes to meds. Encouraged heart healthy diet such as the DASH diet and exercise as tolerated.       Frequent falls   Relevant Orders   Ambulatory referral to [facility]    con't meds Check labs results       Hypothyroidism (Chronic)    Will get lab results from labs recently done      Memory loss - Primary    Worsening over last several months Pt has hx SAH F/u with neuro       Relevant Orders   Ambulatory referral to Neurology   Rheumatoid arthritis ([place]) (Chronic)    On methotrexate May need rheum referral         Follow-up: Return in about 3 months (around 10/28/2019), or if symptoms worsen or fail to improve.  [name], DO

## 2019-07-30 ENCOUNTER — Telehealth: Payer: Self-pay | Admitting: Family Medicine

## 2019-07-30 NOTE — Telephone Encounter (Signed)
Patient daughter wants Dr. Etter Sjogren to touch bases with Physical therapist with [facility] TEL: [phone] Fax : [phone]. Barb Merino PT) referral. Please touch base with [name] [phone] with follow up regarding this request of referral for Pt [name] .

## 2019-08-01 NOTE — Telephone Encounter (Signed)
Left VM on number provided to confirm referral for [facility] or a referral for PT. Advised to call back to confirm.

## 2019-08-06 ENCOUNTER — Telehealth: Payer: Self-pay | Admitting: Family Medicine

## 2019-08-06 NOTE — Telephone Encounter (Signed)
Left detailed verbal auth on below voicemail and to call if further concerns.

## 2019-08-06 NOTE — Telephone Encounter (Signed)
Shawn with [facility]  2 TIMES A WEEK FOR 6 WEEKS  1 TIME  A WEEK FOR 3 WEEKS   Okay to leave Voicmail with Verbal confirmation

## 2019-08-27 ENCOUNTER — Telehealth: Payer: Self-pay | Admitting: Family Medicine

## 2019-08-27 NOTE — Telephone Encounter (Signed)
[name] with [facility] home health calling to report that patient had a recent fall. He states patient was able to move it fine. But he thinks he was trying to tell him that he had an abrasion & Pain in shoulder  [phone]

## 2019-08-29 NOTE — Telephone Encounter (Signed)
[name] called back from [facility] to report that Pt has abrasion on L elbow & some soreness/pain on R shoulder as pt was trying to catch himself as he was about to fall in the driveway. Sean otw to see pt now and will let you know if anything is worse.

## 2019-08-29 NOTE — Telephone Encounter (Signed)
FYI. Called back and got VM and asked to call back for more info

## 2019-09-01 NOTE — Telephone Encounter (Signed)
FYI

## 2019-09-03 ENCOUNTER — Telehealth: Payer: Self-pay

## 2019-09-03 NOTE — Telephone Encounter (Signed)
Please advise 

## 2019-09-03 NOTE — Telephone Encounter (Signed)
Patient daughter called in to see if Dr. Etter Sjogren has put in an order for a Home Health Nurse please follow up with the patient daughter at [phone]

## 2019-09-03 NOTE — Telephone Encounter (Signed)
Patient's Physical Therapist Hilliard Clark called in to see if Dr. Etter Sjogren could put in an order for Skilled nursing for the patient left elbow. The patient had fallen on Monday April the 5th of last week. There is an open wound on the patients left elbow that needs to be looked at by a nurse. If there is any questions please contact Sean at [phone] it's a private cell number for Hilliard Clark and its okay to leave a message.

## 2019-09-03 NOTE — Telephone Encounter (Signed)
See orders----- referral was placed 3/8--- pt should have heard something by now

## 2019-09-04 ENCOUNTER — Encounter: Payer: Self-pay | Admitting: Family Medicine

## 2019-09-04 ENCOUNTER — Other Ambulatory Visit: Payer: Self-pay

## 2019-09-04 ENCOUNTER — Telehealth (INDEPENDENT_AMBULATORY_CARE_PROVIDER_SITE_OTHER): Payer: Medicare Other | Admitting: Family Medicine

## 2019-09-04 ENCOUNTER — Other Ambulatory Visit: Payer: Self-pay | Admitting: Family Medicine

## 2019-09-04 DIAGNOSIS — S51009A Unspecified open wound of unspecified elbow, initial encounter: Secondary | ICD-10-CM

## 2019-09-04 DIAGNOSIS — R296 Repeated falls: Secondary | ICD-10-CM | POA: Diagnosis not present

## 2019-09-04 DIAGNOSIS — S51002A Unspecified open wound of left elbow, initial encounter: Secondary | ICD-10-CM

## 2019-09-04 NOTE — Telephone Encounter (Signed)
I put referral in but he may need at least a virtual because we did not see him for the wound

## 2019-09-04 NOTE — Telephone Encounter (Signed)
Called [facility] and spoke with [place]. [name] was going to have someone call me back in regards to patients home health.

## 2019-09-04 NOTE — Telephone Encounter (Signed)
Left VM on daughter's phone to inform about referral and waiting for a call back

## 2019-09-04 NOTE — Progress Notes (Signed)
Virtual Visit via Video Note  I connected with [name] on 09/04/19 at  4:00 PM EDT by a video enabled telemedicine application and verified that I am speaking with the correct person using two identifiers.  Location: Patient: home with interpretor  Provider: office    I discussed the limitations of evaluation and management by telemedicine and the availability of in person appointments. The patient expressed understanding and agreed to proceed.  History of Present Illness: Pt is home with family friend, peter  He had a fall a few days ago and has a wound on his L elbow-- home health was requesting nursing to come out for wound care.   They also wanted to extend PT    Observations/Objective: There were no vitals filed for this visit. L elbow-- wound-- covered with bandaid    Assessment and Plan: 1. Open wound of elbow, unspecified laterality, initial encounter con't with neosporin Home health to come out for wound care  - Ambulatory referral to [facility]  2. Frequent falls Extend PT  - Ambulatory referral to [place]   Follow Up Instructions:    I discussed the assessment and treatment plan with the patient. The patient was provided an opportunity to ask questions and all were answered. The patient agreed with the plan and demonstrated an understanding of the instructions.   The patient was advised to call back or seek an in-person evaluation if the symptoms worsen or if the condition fails to improve as anticipated.  I provided 25 minutes of non-face-to-face time during this encounter.   [name], DO

## 2019-09-05 NOTE — Telephone Encounter (Signed)
I have sent a message to Danny Lawless - rep with [facility] for feedback.

## 2019-09-05 NOTE — Telephone Encounter (Signed)
Drew notes the pt has been on service (with PT) and they will add on the new orders for him for SN

## 2019-09-16 ENCOUNTER — Institutional Professional Consult (permissible substitution): Payer: Medicare Other | Admitting: Diagnostic Neuroimaging

## 2019-09-29 ENCOUNTER — Encounter: Payer: Self-pay | Admitting: Family Medicine

## 2019-10-27 ENCOUNTER — Encounter: Payer: Self-pay | Admitting: Diagnostic Neuroimaging

## 2019-10-27 ENCOUNTER — Telehealth: Payer: Self-pay | Admitting: *Deleted

## 2019-10-27 ENCOUNTER — Ambulatory Visit: Payer: Medicare Other | Admitting: Diagnostic Neuroimaging

## 2019-10-27 NOTE — Telephone Encounter (Signed)
Patient was no show for new patient appointment today. 

## 2019-11-12 ENCOUNTER — Other Ambulatory Visit: Payer: Self-pay | Admitting: Family Medicine

## 2019-11-12 DIAGNOSIS — R1310 Dysphagia, unspecified: Secondary | ICD-10-CM

## 2019-11-13 ENCOUNTER — Telehealth: Payer: Self-pay | Admitting: Family Medicine

## 2019-11-13 ENCOUNTER — Other Ambulatory Visit: Payer: Self-pay | Admitting: Family Medicine

## 2019-11-13 ENCOUNTER — Ambulatory Visit (INDEPENDENT_AMBULATORY_CARE_PROVIDER_SITE_OTHER): Payer: Medicare Other | Admitting: Family Medicine

## 2019-11-13 ENCOUNTER — Encounter: Payer: Self-pay | Admitting: Family Medicine

## 2019-11-13 ENCOUNTER — Other Ambulatory Visit: Payer: Self-pay

## 2019-11-13 VITALS — BP 135/47 | HR 59 | Temp 97.7°F | Resp 16 | Wt 121.0 lb

## 2019-11-13 DIAGNOSIS — S0990XA Unspecified injury of head, initial encounter: Secondary | ICD-10-CM

## 2019-11-13 DIAGNOSIS — E039 Hypothyroidism, unspecified: Secondary | ICD-10-CM | POA: Diagnosis not present

## 2019-11-13 DIAGNOSIS — E785 Hyperlipidemia, unspecified: Secondary | ICD-10-CM | POA: Diagnosis not present

## 2019-11-13 DIAGNOSIS — I10 Essential (primary) hypertension: Secondary | ICD-10-CM

## 2019-11-13 DIAGNOSIS — R600 Localized edema: Secondary | ICD-10-CM

## 2019-11-13 DIAGNOSIS — M069 Rheumatoid arthritis, unspecified: Secondary | ICD-10-CM

## 2019-11-13 LAB — LIPID PANEL
Cholesterol: 136 mg/dL (ref 0–200)
HDL: 31.1 mg/dL — ABNORMAL LOW (ref >39.00)
LDL Cholesterol: 67 mg/dL (ref 0–99)
NonHDL: 105.39
Total CHOL/HDL Ratio: 4
Triglycerides: 191 mg/dL — ABNORMAL HIGH (ref 0.0–149.0)
VLDL: 38.2 mg/dL (ref 0.0–40.0)

## 2019-11-13 LAB — COMPREHENSIVE METABOLIC PANEL
ALT: 9 U/L (ref 0–53)
AST: 17 U/L (ref 0–37)
Albumin: 4.1 g/dL (ref 3.5–5.2)
Alkaline Phosphatase: 58 U/L (ref 39–117)
BUN: 17 mg/dL (ref 6–23)
CO2: 29 mEq/L (ref 19–32)
Calcium: 9.6 mg/dL (ref 8.4–10.5)
Chloride: 104 mEq/L (ref 96–112)
Creatinine, Ser: 1.1 mg/dL (ref 0.40–1.50)
GFR: 62.97 mL/min (ref >60.00)
Glucose, Bld: 90 mg/dL (ref 70–99)
Potassium: 4 mEq/L (ref 3.5–5.1)
Sodium: 140 mEq/L (ref 135–145)
Total Bilirubin: 0.3 mg/dL (ref 0.2–1.2)
Total Protein: 6.6 g/dL (ref 6.0–8.3)

## 2019-11-13 LAB — TSH: TSH: 1.23 u[IU]/mL (ref 0.35–4.50)

## 2019-11-13 MED ORDER — GEMFIBROZIL 600 MG PO TABS: 600.0000 mg | ORAL_TABLET | Freq: Every day | ORAL | 1 refills | Status: DC

## 2019-11-13 MED ORDER — FUROSEMIDE 20 MG PO TABS: 20.0000 mg | ORAL_TABLET | Freq: Every day | ORAL | 3 refills | Status: DC

## 2019-11-13 MED ORDER — NONFORMULARY OR COMPOUNDED ITEM: 2 refills | Status: DC

## 2019-11-13 NOTE — Assessment & Plan Note (Signed)
Elevate legs  Compression socks Lasix daily

## 2019-11-13 NOTE — Assessment & Plan Note (Addendum)
Few months ago with headache Check MRI Consider neuropsych eval

## 2019-11-13 NOTE — Telephone Encounter (Signed)
It only comes 600 mg

## 2019-11-13 NOTE — Assessment & Plan Note (Signed)
Per rheum 

## 2019-11-13 NOTE — Telephone Encounter (Signed)
Okay to take 1 tablet daily instead of 1/2 bid?

## 2019-11-13 NOTE — Assessment & Plan Note (Signed)
Encouraged heart healthy diet, increase exercise, avoid trans fats, consider a krill oil cap daily 

## 2019-11-13 NOTE — Assessment & Plan Note (Signed)
Check labs con't meds 

## 2019-11-13 NOTE — Telephone Encounter (Signed)
Caller: Marquise Call back phone number [phone]  Patient states he when to the pharmacy to pick up medicine. Was told they have not received anything.   furosemide (LASIX) 20 MG tablet  NONFORMULARY OR COMPOUNDED ITEM [[id number]]    [id number] [address] [address]  [place], [place] [address]  Phone:  [phone] Fax:  [phone]

## 2019-11-13 NOTE — Telephone Encounter (Signed)
That is fine May need new rx for #90

## 2019-11-13 NOTE — Telephone Encounter (Signed)
Glucosamine HCl (GLUCOSAMINE PO) [[id number]]    Medication that patient's daughter is calling about

## 2019-11-13 NOTE — Patient Instructions (Signed)

## 2019-11-13 NOTE — Progress Notes (Signed)
Patient ID: [name], male    DOB: 03-12-1929  Age: 86 y.o. MRN: [id number]    Subjective:  Subjective  HPI [name] presents with his wife c/o swelling in lower extremities x few weeks.   No calf pain , no sob  No cp.   Pt also c/o fall a few months ago when he hit his head.  Pt denies loc.   He is c/o memory problems   Review of Systems  Constitutional: Negative for chills and fever.  HENT: Negative for congestion and hearing loss.   Eyes: Negative for discharge.  Respiratory: Negative for cough and shortness of breath.   Cardiovascular: Negative for chest pain, palpitations and leg swelling.  Gastrointestinal: Negative for abdominal pain, blood in stool, constipation, diarrhea, nausea and vomiting.  Genitourinary: Negative for dysuria, frequency, hematuria and urgency.  Musculoskeletal: Negative for back pain and myalgias.  Skin: Negative for rash.  Allergic/Immunologic: Negative for environmental allergies.  Neurological: Positive for headaches. Negative for dizziness and weakness.  Hematological: Does not bruise/bleed easily.  Psychiatric/Behavioral: Positive for decreased concentration. Negative for confusion, dysphoric mood, self-injury, sleep disturbance and suicidal ideas. The patient is not nervous/anxious.     History Past Medical History:  Diagnosis Date  . 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 F/U with cancer center and Dr. Lucia Gaskins rotinely  . Depression   . Eczema   . Epistaxis    f/u per ENT  . Erectile dysfunction   . Gastric ulcer 12/11   H-Pylori Tx, EGD 3-12: gastritis  . Hay fever   . Head injury 11/2017   with fall  . Hyperlipemia   . Hypertension   . Hypothyroidism    Hypothyroid  . Insomnia   . Osteopenia     per DEXA 07-2008 (Rx fosamax)  . Rheumatoid arthritis ([place])   . Stroke [facility])     " balance issue"    He has a past surgical history that includes Colonoscopy (2005, 2008); Inguinal hernia repair;  Colon surgery; IR ANGIO VERTEBRAL SEL VERTEBRAL BILAT MOD SED (12/13/2017); IR ANGIO INTRA EXTRACRAN SEL COM CAROTID INNOMINATE BILAT MOD SED (12/13/2017); Radiology with anesthesia (N/A, 12/18/2017); and IR PTA Intracranial (12/18/2017).   His family history is not on file.He reports that he has never smoked. He has never used smokeless tobacco. He reports that he does not drink alcohol and does not use drugs.  Current Outpatient Medications on File Prior to Visit  Medication Sig Dispense Refill  . Dextran 70-Hypromellose, PF, ([facility] NATURAL TEARS) 0.1-0.3 % SOLN Place 1 drop into both eyes at bedtime.    . fluticasone (FLONASE) 50 MCG/ACT nasal spray Place 2 sprays into the nose daily as needed (seasonal allergies).     . folic acid (FOLVITE) 1 MG tablet Take 1 mg by mouth every morning.    Marland Kitchen gemfibrozil (LOPID) 600 MG tablet Take 0.5 tablets (300 mg total) by mouth 2 (two) times daily before a meal. 60 tablet 0  . Glucosamine HCl (GLUCOSAMINE PO) Take 1 tablet by mouth every morning.    Marland Kitchen levothyroxine (SYNTHROID) 112 MCG tablet Take 112 mcg by mouth daily before breakfast.    . losartan-hydrochlorothiazide (HYZAAR) 50-12.5 MG per tablet TAKE ONE TABLET BY MOUTH ONE TIME DAILY (Patient taking differently: Take 1 tablet by mouth every morning. ) 30 tablet 0  . methotrexate (RHEUMATREX) 2.5 MG tablet Take 15 mg by mouth every Saturday.     . Multiple Vitamin (MULTIVITAMIN WITH MINERALS) TABS  tablet Take 1 tablet by mouth every morning.    . pantoprazole (PROTONIX) 40 MG tablet TAKE 1 TABLET BY MOUTH EVERY DAY 30 tablet 3  . Vitamin D, Ergocalciferol, (DRISDOL) 50000 units CAPS capsule Take 50,000 Units by mouth every Saturday.      No current facility-administered medications on file prior to visit.     Objective:  Objective  Physical Exam Vitals and nursing note reviewed.  Constitutional:      General: He is sleeping.     Appearance: He is well-developed.  HENT:     Head: Normocephalic and  atraumatic.  Eyes:     Pupils: Pupils are equal, round, and reactive to light.  Neck:     Thyroid: No thyromegaly.  Cardiovascular:     Rate and Rhythm: Normal rate and regular rhythm.     Heart sounds: No murmur heard.   Pulmonary:     Effort: Pulmonary effort is normal. No respiratory distress.     Breath sounds: Normal breath sounds. No wheezing or rales.  Chest:     Chest wall: No tenderness.  Musculoskeletal:        General: Swelling present. No tenderness.     Cervical back: Normal range of motion and neck supple.     Right lower leg: Swelling present. 1+ Pitting Edema present.     Left lower leg: Swelling present. 1+ Pitting Edema present.  Skin:    General: Skin is warm and dry.  Neurological:     Comments: Pt c/o memory loss but answers all questions appropriately   Psychiatric:        Behavior: Behavior normal.        Thought Content: Thought content normal.        Judgment: Judgment normal.    BP (!) 135/47 (BP Location: Left Arm, Patient Position: Sitting, Cuff Size: Small)   Pulse (!) 59   Temp 97.7 F (36.5 C) (Temporal)   Resp 16   Wt 121 lb (54.9 kg)   SpO2 100%   BMI 23.10 kg/m  Wt Readings from Last 3 Encounters:  11/13/19 121 lb (54.9 kg)  03/11/19 134 lb 0.6 oz (60.8 kg)  02/27/19 131 lb 12.8 oz (59.8 kg)     Lab Results  Component Value Date   WBC 7.8 03/09/2019   HGB 11.9 (L) 03/09/2019   HCT 35.0 (L) 03/09/2019   PLT 246 03/09/2019   GLUCOSE 90 11/13/2019   CHOL 136 11/13/2019   TRIG 191.0 (H) 11/13/2019   HDL 31.10 (L) 11/13/2019   LDLDIRECT 74.9 04/11/2011   LDLCALC 67 11/13/2019   ALT 9 11/13/2019   AST 17 11/13/2019   NA 140 11/13/2019   K 4.0 11/13/2019   CL 104 11/13/2019   CREATININE 1.10 11/13/2019   BUN 17 11/13/2019   CO2 29 11/13/2019   TSH 1.23 11/13/2019   PSA 3.76 04/11/2011   INR 1.0 03/09/2019   HGBA1C 6.3 (H) 12/11/2017    MR Lumbar Spine w/o contrast  Result Date: 04/19/2019 CLINICAL DATA:  Four months  of low back pain radiating to the right leg EXAM: MRI LUMBAR SPINE WITHOUT CONTRAST TECHNIQUE: Multiplanar, multisequence MR imaging of the lumbar spine was performed. No intravenous contrast was administered. COMPARISON:  Lumbar radiography 01/15/2019 FINDINGS: Segmentation:  5 lumbar type vertebrae Alignment: Grade 1 anterolisthesis at L3-4 and L4-5. Mild levoscoliosis Vertebrae:  No fracture, evidence of discitis, or bone lesion. Conus medullaris and cauda equina: Conus extends to the L1-2 level. Conus and  cauda equina appear normal. Paraspinal and other soft tissues: Right renal cystic intensities. Disc levels: T12- L1: Unremarkable. L1-L2: Unremarkable. L2-L3: Mild spondylosis and minimal annulus bulging L3-L4: Advanced facet arthropathy with spurring, joint distortion with anterolisthesis. There is a degenerative thick wall ganglion in the posterior midline measuring 1 cm and causing high-grade spinal stenosis. The disc is narrowed and bulging, asymmetric to the right where there is moderate foraminal impingement. Left foraminal narrowing is mild. L4-L5: Advanced facet arthropathy with spurring and anterolisthesis. The disc is narrowed and bulging with annular fissures. Advanced and compressive spinal stenosis. Moderate right foraminal narrowing. L5-S1:Unremarkable. IMPRESSION: 1. L3-4 and L4-5 advanced facet arthropathy with anterolisthesis and focal disc degeneration. 2. L3-4 high-grade spinal stenosis primarily related to a degenerative ligamentum flavum ganglion. There is also right foraminal impingement 3. L4-5 advanced spinal stenosis. Moderate right foraminal narrowing. 4. Mild levoscoliosis. Electronically Signed   By: Monte Fantasia M.D.   On: 04/19/2019 20:08     Assessment & Plan:  Plan  I am having [name] start on furosemide and NONFORMULARY OR COMPOUNDED ITEM. I am also having him maintain his fluticasone, losartan-hydrochlorothiazide, Vitamin D (Ergocalciferol), gemfibrozil,  methotrexate, levothyroxine, [facility]S Natural Tears, multivitamin with minerals, Glucosamine HCl (GLUCOSAMINE PO), folic acid, and pantoprazole.  Meds ordered this encounter  Medications  . furosemide (LASIX) 20 MG tablet    Sig: Take 1 tablet (20 mg total) by mouth daily.    Dispense:  30 tablet    Refill:  3  . NONFORMULARY OR COMPOUNDED ITEM    Sig: Compression stocking   20-30 mg / hg   #1   Dx low ext edema    Dispense:  1 each    Refill:  2    Problem List Items Addressed This Visit      Unprioritized   Dyslipidemia    Encouraged heart healthy diet, increase exercise, avoid trans fats, consider a krill oil cap daily      Relevant Orders   Lipid panel (Completed)   Comprehensive metabolic panel (Completed)   TSH (Completed)   Essential hypertension (Chronic)    Well controlled, no changes to meds. Encouraged heart healthy diet such as the DASH diet and exercise as tolerated.        Relevant Medications   furosemide (LASIX) 20 MG tablet   Other Relevant Orders   Lipid panel (Completed)   Comprehensive metabolic panel (Completed)   TSH (Completed)   Head trauma    Few months ago with headache Check MRI Consider neuropsych eval       Relevant Orders   MR Brain Wo Contrast   Hypothyroidism (Chronic)    Check labs  con't meds       Relevant Orders   TSH (Completed)   Lower extremity edema - Primary    Elevate legs  Compression socks Lasix daily       Relevant Medications   furosemide (LASIX) 20 MG tablet   NONFORMULARY OR COMPOUNDED ITEM   Other Relevant Orders   Comprehensive metabolic panel (Completed)   Rheumatoid arthritis ([place]) (Chronic)    Per rheum          Follow-up: Return in about 3 weeks (around 12/04/2019), or if symptoms worsen or fail to improve, for swelling , memory loss .  [name], DO

## 2019-11-13 NOTE — Assessment & Plan Note (Signed)
Well controlled, no changes to meds. Encouraged heart healthy diet such as the DASH diet and exercise as tolerated.  °

## 2019-11-13 NOTE — Telephone Encounter (Signed)
Caller [name]  Call Back # [phone]  Per patient's daughter patient has a medication dispenser that only dispenses whole pills. Per patient daughter she would like to get 300 MG tablets to put in dispenser.Patient takes 600mg  cuts them in half at each meal.   Please Advise

## 2019-11-13 NOTE — Telephone Encounter (Signed)
Spoke w/ [name]- informed that Gemfibrozil doesn't come in 300mg  but okay to take 1 tablet daily instead of 1/2 bid. [facility].

## 2019-11-13 NOTE — Telephone Encounter (Signed)
Spoke w/ [name] she is actually calling about the Gemfibrozil- she is wanting to know if it comes in 300mg  tablets so they don't have to cut the tablets in 1/2 or if Pt can take 1 whole tablet once daily of the 600mg  tablets.

## 2019-11-13 NOTE — Telephone Encounter (Signed)
Lasix resent to pharmacy. The prescription for the compression stockings was printed and given to Pt- he will have to find a medical supply store that supplies them.

## 2019-11-26 ENCOUNTER — Other Ambulatory Visit: Payer: Self-pay

## 2019-11-26 DIAGNOSIS — R0602 Shortness of breath: Secondary | ICD-10-CM

## 2019-12-01 ENCOUNTER — Ambulatory Visit (HOSPITAL_COMMUNITY): Admission: RE | Admit: 2019-12-01 | Payer: Medicare Other | Source: Ambulatory Visit

## 2019-12-04 ENCOUNTER — Ambulatory Visit: Payer: Medicare Other | Admitting: Family Medicine

## 2019-12-04 DIAGNOSIS — Z0289 Encounter for other administrative examinations: Secondary | ICD-10-CM

## 2019-12-11 ENCOUNTER — Encounter: Payer: Self-pay | Admitting: General Practice

## 2019-12-25 DIAGNOSIS — R351 Nocturia: Secondary | ICD-10-CM | POA: Diagnosis not present

## 2019-12-25 DIAGNOSIS — N3281 Overactive bladder: Secondary | ICD-10-CM | POA: Diagnosis not present

## 2019-12-25 DIAGNOSIS — N401 Enlarged prostate with lower urinary tract symptoms: Secondary | ICD-10-CM | POA: Diagnosis not present

## 2019-12-25 DIAGNOSIS — R350 Frequency of micturition: Secondary | ICD-10-CM | POA: Diagnosis not present

## 2020-02-15 ENCOUNTER — Other Ambulatory Visit: Payer: Self-pay | Admitting: Family Medicine

## 2020-02-15 DIAGNOSIS — R1310 Dysphagia, unspecified: Secondary | ICD-10-CM

## 2020-03-13 ENCOUNTER — Other Ambulatory Visit: Payer: Self-pay | Admitting: Family Medicine

## 2020-03-13 DIAGNOSIS — R1310 Dysphagia, unspecified: Secondary | ICD-10-CM

## 2020-03-16 ENCOUNTER — Telehealth: Payer: Self-pay | Admitting: Family Medicine

## 2020-03-16 MED ORDER — LEVOTHYROXINE SODIUM 112 MCG PO TABS: 112.0000 ug | ORAL_TABLET | Freq: Every day | ORAL | 1 refills | Status: DC

## 2020-03-16 MED ORDER — GEMFIBROZIL 600 MG PO TABS: 600.0000 mg | ORAL_TABLET | Freq: Every day | ORAL | 0 refills | Status: DC

## 2020-03-16 MED ORDER — LEVOTHYROXINE SODIUM 112 MCG PO TABS: 112.0000 ug | ORAL_TABLET | Freq: Every day | ORAL | 0 refills | Status: DC

## 2020-03-16 MED ORDER — GEMFIBROZIL 600 MG PO TABS: 600.0000 mg | ORAL_TABLET | Freq: Every day | ORAL | 1 refills | Status: DC

## 2020-03-16 NOTE — Telephone Encounter (Signed)
Refills sent

## 2020-03-16 NOTE — Telephone Encounter (Signed)
One MONTH SUPPLY TO local and other to [facility]    Medication: gemfibrozil (LOPID) 600 MG tablet [[id number]  levothyroxine (SYNTHROID) 112 MCG tablet [[id number]]   Has the patient contacted their pharmacy? No. (If no, request that the patient contact the pharmacy for the refill.) (If yes, when and what did the pharmacy advise?)  Preferred Pharmacy (with phone number or street name[id number] [place] [place] Phone:  [phone]  Fax:  [phone]      Agent: Please be advised that RX refills may take up to 3 business days. We ask that you follow-up with your pharmacy.

## 2020-03-30 ENCOUNTER — Other Ambulatory Visit: Payer: Self-pay | Admitting: Family Medicine

## 2020-03-30 DIAGNOSIS — R1310 Dysphagia, unspecified: Secondary | ICD-10-CM

## 2020-04-02 ENCOUNTER — Telehealth: Payer: Self-pay | Admitting: Family Medicine

## 2020-04-02 MED ORDER — LEVOTHYROXINE SODIUM 100 MCG PO TABS: 100.0000 ug | ORAL_TABLET | Freq: Every day | ORAL | 2 refills | Status: DC

## 2020-04-02 NOTE — Telephone Encounter (Signed)
Could you confirm which dose patient should be on?

## 2020-04-02 NOTE — Telephone Encounter (Signed)
Med sent.

## 2020-04-02 NOTE — Telephone Encounter (Signed)
Patient states we sent the wrong strength of  medication in to mail order pharmacy, [facility]     wrong strength of medication  levothyroxine (SYNTHROID) 112 MCG tablet [[id number]]  Patient want 100MCG levothyroxine [facility]  [id number] [place] [place] Phone:  [phone]  Fax:  [phone]

## 2020-04-02 NOTE — Telephone Encounter (Signed)
Ok to send in 100 mcg #30  2 refills but then I would like to recheck tsh in 2 months  because according to the med list he has been on 112 for the last few months

## 2020-04-03 ENCOUNTER — Other Ambulatory Visit: Payer: Self-pay | Admitting: Family Medicine

## 2020-04-03 DIAGNOSIS — R1310 Dysphagia, unspecified: Secondary | ICD-10-CM

## 2020-04-05 ENCOUNTER — Telehealth: Payer: Self-pay | Admitting: Family Medicine

## 2020-04-05 NOTE — Telephone Encounter (Signed)
Please advise 

## 2020-04-05 NOTE — Telephone Encounter (Signed)
Patient's daughter is requesting referral  to remote health to provided in home nursing  [phone]

## 2020-04-06 NOTE — Telephone Encounter (Signed)
Does she mean home health referral?

## 2020-04-07 ENCOUNTER — Other Ambulatory Visit: Payer: Self-pay | Admitting: Family Medicine

## 2020-04-07 DIAGNOSIS — R296 Repeated falls: Secondary | ICD-10-CM

## 2020-04-07 DIAGNOSIS — R413 Other amnesia: Secondary | ICD-10-CM

## 2020-04-07 NOTE — Telephone Encounter (Signed)
I will place referral but pt will probably need a face to face for ins to pay since he has not been seen since June

## 2020-04-07 NOTE — Telephone Encounter (Signed)
Correct

## 2020-04-13 ENCOUNTER — Ambulatory Visit (INDEPENDENT_AMBULATORY_CARE_PROVIDER_SITE_OTHER): Payer: Medicare HMO | Admitting: Family Medicine

## 2020-04-13 ENCOUNTER — Other Ambulatory Visit: Payer: Self-pay

## 2020-04-13 ENCOUNTER — Encounter: Payer: Self-pay | Admitting: Family Medicine

## 2020-04-13 VITALS — BP 104/60 | HR 61 | Temp 97.7°F | Resp 18 | Ht 60.68 in | Wt 122.4 lb

## 2020-04-13 DIAGNOSIS — E039 Hypothyroidism, unspecified: Secondary | ICD-10-CM | POA: Diagnosis not present

## 2020-04-13 DIAGNOSIS — E559 Vitamin D deficiency, unspecified: Secondary | ICD-10-CM

## 2020-04-13 DIAGNOSIS — M25511 Pain in right shoulder: Secondary | ICD-10-CM | POA: Diagnosis not present

## 2020-04-13 DIAGNOSIS — R296 Repeated falls: Secondary | ICD-10-CM | POA: Diagnosis not present

## 2020-04-13 DIAGNOSIS — Z23 Encounter for immunization: Secondary | ICD-10-CM

## 2020-04-13 DIAGNOSIS — M0579 Rheumatoid arthritis with rheumatoid factor of multiple sites without organ or systems involvement: Secondary | ICD-10-CM

## 2020-04-13 DIAGNOSIS — I10 Essential (primary) hypertension: Secondary | ICD-10-CM

## 2020-04-13 LAB — COMPREHENSIVE METABOLIC PANEL
ALT: 10 U/L (ref 0–53)
AST: 18 U/L (ref 0–37)
Albumin: 4.1 g/dL (ref 3.5–5.2)
Alkaline Phosphatase: 74 U/L (ref 39–117)
BUN: 22 mg/dL (ref 6–23)
CO2: 30 mEq/L (ref 19–32)
Calcium: 9.6 mg/dL (ref 8.4–10.5)
Chloride: 102 mEq/L (ref 96–112)
Creatinine, Ser: 1.26 mg/dL (ref 0.40–1.50)
GFR: 50.44 mL/min — ABNORMAL LOW (ref >60.00)
Glucose, Bld: 105 mg/dL — ABNORMAL HIGH (ref 70–99)
Potassium: 4 mEq/L (ref 3.5–5.1)
Sodium: 139 mEq/L (ref 135–145)
Total Bilirubin: 0.4 mg/dL (ref 0.2–1.2)
Total Protein: 7 g/dL (ref 6.0–8.3)

## 2020-04-13 LAB — LIPID PANEL
Cholesterol: 151 mg/dL (ref 0–200)
HDL: 42.9 mg/dL (ref >39.00)
LDL Cholesterol: 77 mg/dL (ref 0–99)
NonHDL: 108.1
Total CHOL/HDL Ratio: 4
Triglycerides: 155 mg/dL — ABNORMAL HIGH (ref 0.0–149.0)
VLDL: 31 mg/dL (ref 0.0–40.0)

## 2020-04-13 LAB — VITAMIN D 25 HYDROXY (VIT D DEFICIENCY, FRACTURES): VITD: 49.4 ng/mL (ref 30.00–100.00)

## 2020-04-13 MED ORDER — NONFORMULARY OR COMPOUNDED ITEM: 0 refills | Status: DC

## 2020-04-13 MED ORDER — METHOTREXATE 2.5 MG PO TABS: 15.0000 mg | ORAL_TABLET | ORAL | 5 refills | Status: DC

## 2020-04-13 NOTE — Patient Instructions (Signed)
Community Occupational psychologist of Services Cost  A Matter of Balance Class locations vary. Call [place] on Aging for more information.  http://dawson-may.com/ [phone] 8-Session program addressing the fear of falling and increasing activity levels of older adults Free to minimal cost  A.C.T. By [employer] [address], [place], [place] [address].  BetaBlues.dk [phone]  Personal training, gym, classes including Silver Sneakers* and ACTion for Aging Adults Fee-based  A.H.O.Y. (Add Health to [place]) Airs on Time [employer] 13, M-F at [place]: [employer],  [place] [facility] [place] [place],  [place], [facility], [address] [facility], [place], [place], [place] [address]  [place] Location: Sharrell Ku. [employer] [place]      [phone]  [phone]  [phone]  [phone]  [phone]  [phone]  [phone]  [phone]  [phone]  [phone]    [phone] A total-body conditioning class for adults 77 and older; designed to increase muscular strength, endurance, range of movement, flexibility, balance, agility and coordination Free  [facility] [place], [place] [address] [place]      [address]      [phone]      Pilate's class for individualsreturning to exercise after an injury, before or after surgery or for individuals with complex musculoskeletal issues; designed to improve strength, balance , flexibility      $15/class  [place] [address] [place], [place] [address] [email or web address] [place] classes for beginners to advanced [place], [place] [address] [email or web address] [place] Chair Exercises Free, ages 77 and older; Ages 43-59 fee based  Marvia Pickles, [facility] [address] [place], [place] [address] Seniorcenter_1 .Beverlee Nims [phone]  A.H.O.Y. Tai Chi Fee-based Donation based or free  [place] Class locations vary.  Call or email Angela Burke or view website for more information. Info_[email or web address] .com GainPain.com.cy.html [phone] Ongoing classes at local YMCAs and gyms Fee-based  Silver Sneakers A.C.T. By [place] [employer]: [facility] [place] [phone]  [phone] Classes designed for older adults who want to improve their strength, flexibility, balance and endurance.   Silver sneakers is covered by some insurance plans and includes a fitness center membership at participating locations. Find out more by calling [phone] or visiting [email or web address] Covered by some insurance plans  [facility] [place] [phone] A.H.O.Y., fitness room, personal training, fitness classes for injury prevention, strength, balance, flexibility, water fitness classes Ages 55+: $36 for 6 months; Ages 80-54: $13 for 6 months  Tai Chi for Everybody [facility] [address] [place], [place] [address] Taichiforeverybody_3 .Patsi Sears [phone] Tai Chi classes for beginners to advanced; geared for seniors Donation Based  [employer] (Helpling Others Participate in Exercise     Loyal Gambler. Rosana Hoes, PhD, [place] [email or web address] [place]     [phone]     A comprehensive fitness program for adults.  The program paris senior-level undergraduates Kinesiology students with adults who desire to learn how to exercise safely.  Includes a structural exercise class focusing on functional fitnesss     $100/semester in fall and spring; $75 in summer (no trainers)    *Silver Sneakers is covered by some Personal assistant and includes a  Radio producer at participating locations.  Find out more by calling [phone] or visiting [email or web address]  For additional health and human services resources for senior adults, please contact SeniorLine at [phone] in [place] and [place] at [phone] in all other areas.Preventing Falls and Fractures  Falls can be very serious, especially for older adults or people with osteoporosis  Falls can be caused by:  Tripping or slipping  Slow reflexes  Balance problems  Reduced muscle strength  Poor vision or a recent change in prescription  Illness and some medications (especially blood pressure pills, diuretics, heart medicines, muscle relaxants and sleep medications)  Drinking alcohol  To prevent falls outdoors:  Use a can or walker if needed  Wear rubber-soled shoes so you don't slip  DO NOT buy "shape up" shoes with rocker bottom soles if you have balance problems.  The thick soles and shape make it more difficult to keep your balance.  Put kitty litter or salt on icy sidewalks  Walk on the grass if the sidewalks are slick  Avoid walking on uneven ground whenever possible  T prevent falls indoors:  Keep rooms clutter-free, especially hallways,  stairs and paths to light switches  Remove throw rugs  Install night lights, especially to and in the bathroom  Turn on lights before going downstairs  Keep a flashlight next to your bed  Buy a cordless phone to keep with you instead of jumping up to answer the phone  Install grab bars in the bathroom near the shower and toilet  Install rails on both sides of the stairs.  Make sure the stairs are well lit  Wear slippers with non-skid soles.  Do not walk around in stockings or socks  Balance problems and dizziness are not a normal part of growing older.  If you begin having balance problems or dizziness see your doctor.  Physical Therapy can help you with many balance problems, strengthening hip and leg muscles and with gait training.  To keep your bones healthy make sure you are getting enough calcium and Vitamin D each day.  Ask your doctor or pharmacist about supplements.  Regular weight-bearing exercise like walking, lifting weights or dancing can help strengthen bones and prevent osteoporosis.

## 2020-04-14 ENCOUNTER — Other Ambulatory Visit: Payer: Self-pay | Admitting: Family Medicine

## 2020-04-14 DIAGNOSIS — M25511 Pain in right shoulder: Secondary | ICD-10-CM | POA: Insufficient documentation

## 2020-04-14 LAB — THYROID PANEL WITH TSH
Free Thyroxine Index: 2.5 (ref 1.4–3.8)
T3 Uptake: 34 % (ref 22–35)
T4, Total: 7.3 ug/dL (ref 4.9–10.5)
TSH: 1.9 mIU/L (ref 0.40–4.50)

## 2020-04-14 MED ORDER — LEVOTHYROXINE SODIUM 100 MCG PO TABS
100.0000 ug | ORAL_TABLET | Freq: Every day | ORAL | 1 refills | Status: DC
Start: 2020-04-14 — End: 2020-12-02

## 2020-04-14 NOTE — Assessment & Plan Note (Signed)
Home health referral placed

## 2020-04-14 NOTE — Assessment & Plan Note (Signed)
Well controlled, no changes to meds. Encouraged heart healthy diet such as the DASH diet and exercise as tolerated.  °

## 2020-04-14 NOTE — Assessment & Plan Note (Signed)
Pt needs a rheumatologist f/u Refill methotrexate

## 2020-04-14 NOTE — Assessment & Plan Note (Signed)
con't synthroid Check labs  

## 2020-04-14 NOTE — Assessment & Plan Note (Signed)
Pt recently reinjured r shoulder  F/u ortho

## 2020-04-14 NOTE — Progress Notes (Signed)
Patient ID: [name], male    DOB: [birth year]  Age: 86 y.o. MRN: [id number]    Subjective:  Subjective  HPI [name] presents for f/u with his daughter (to translate).   He has been falling a lot and is unsteady on his feet .  They are requesting home health to come out to house for physical therapy and help with medication.   He is not taking his meds regularly because he forgets   His daughter puts them together for him but if she is not there to give them to him he does not remember to take them    He also is c/o R shoulder pain --- he has seen ortho in past and was given an injection which helped but he fell again and reinjured the shoulder  Review of Systems  Constitutional: Negative for appetite change, diaphoresis, fatigue and unexpected weight change.  Eyes: Negative for pain, redness and visual disturbance.  Respiratory: Negative for cough, chest tightness, shortness of breath and wheezing.   Cardiovascular: Negative for chest pain, palpitations and leg swelling.  Endocrine: Negative for cold intolerance, heat intolerance, polydipsia, polyphagia and polyuria.  Genitourinary: Negative for difficulty urinating, dysuria and frequency.  Neurological: Negative for dizziness, light-headedness, numbness and headaches.  Psychiatric/Behavioral: Positive for confusion and decreased concentration. Negative for sleep disturbance.    History Past Medical History:  Diagnosis Date  . 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 F/U with cancer center and Dr. Lucia Gaskins rotinely  . Depression   . Eczema   . Epistaxis    f/u per ENT  . Erectile dysfunction   . Gastric ulcer 12/11   H-Pylori Tx, EGD 3-12: gastritis  . Hay fever   . Head injury 11/2017   with fall  . Hyperlipemia   . Hypertension   . Hypothyroidism    Hypothyroid  . Insomnia   . Osteopenia     per DEXA 07-2008 (Rx fosamax)  . Rheumatoid arthritis ([place])   . Stroke [facility])     " balance  issue"    He has a past surgical history that includes Colonoscopy (2005, 2008); Inguinal hernia repair; Colon surgery; IR ANGIO VERTEBRAL SEL VERTEBRAL BILAT MOD SED (12/13/2017); IR ANGIO INTRA EXTRACRAN SEL COM CAROTID INNOMINATE BILAT MOD SED (12/13/2017); Radiology with anesthesia (N/A, 12/18/2017); and IR PTA Intracranial (12/18/2017).   His family history is not on file.He reports that he has never smoked. He has never used smokeless tobacco. He reports that he does not drink alcohol and does not use drugs.  Current Outpatient Medications on File Prior to Visit  Medication Sig Dispense Refill  . Dextran 70-Hypromellose, PF, ([employer] NATURAL TEARS) 0.1-0.3 % SOLN Place 1 drop into both eyes at bedtime.    . fluticasone (FLONASE) 50 MCG/ACT nasal spray Place 2 sprays into the nose daily as needed (seasonal allergies).     . folic acid (FOLVITE) 1 MG tablet Take 1 mg by mouth every morning.    . furosemide (LASIX) 20 MG tablet Take 1 tablet (20 mg total) by mouth daily. 30 tablet 3  . gemfibrozil (LOPID) 600 MG tablet Take 1 tablet (600 mg total) by mouth daily. 90 tablet 1  . Glucosamine HCl (GLUCOSAMINE PO) Take 1 tablet by mouth every morning.    Marland Kitchen levothyroxine (SYNTHROID) 100 MCG tablet Take 1 tablet (100 mcg total) by mouth daily. 30 tablet 2  . losartan-hydrochlorothiazide (HYZAAR) 50-12.5 MG per tablet TAKE ONE TABLET BY MOUTH ONE  TIME DAILY (Patient taking differently: Take 1 tablet by mouth every morning. ) 30 tablet 0  . Multiple Vitamin (MULTIVITAMIN WITH MINERALS) TABS tablet Take 1 tablet by mouth every morning.    . NONFORMULARY OR COMPOUNDED ITEM Compression stocking   20-30 mg / hg   #1   Dx low ext edema 1 each 2  . pantoprazole (PROTONIX) 40 MG tablet TAKE 1 TABLET BY MOUTH EVERY DAY 15 tablet 0  . Vitamin D, Ergocalciferol, (DRISDOL) 50000 units CAPS capsule Take 50,000 Units by mouth every Saturday.      No current facility-administered medications on file prior to visit.       Objective:  Objective  Physical Exam Vitals and nursing note reviewed.  Constitutional:      General: He is sleeping.     Appearance: He is well-developed.  HENT:     Head: Normocephalic and atraumatic.  Eyes:     Pupils: Pupils are equal, round, and reactive to light.  Neck:     Thyroid: No thyromegaly.  Cardiovascular:     Rate and Rhythm: Normal rate and regular rhythm.     Heart sounds: No murmur heard.   Pulmonary:     Effort: Pulmonary effort is normal. No respiratory distress.     Breath sounds: Normal breath sounds. No wheezing or rales.  Chest:     Chest wall: No tenderness.  Musculoskeletal:     Right shoulder: Tenderness present. Decreased range of motion. Decreased strength.     Cervical back: Normal range of motion and neck supple.  Skin:    General: Skin is warm and dry.  Neurological:     Comments: Unsteady on feet Walking with cane  Able to answer questions appropriately -- with help from daughter    Psychiatric:        Behavior: Behavior normal.        Thought Content: Thought content normal.        Judgment: Judgment normal.    BP 104/60 (BP Location: Left Arm, Patient Position: Sitting, Cuff Size: Normal)   Pulse 61   Temp 97.7 F (36.5 C) (Oral)   Resp 18   Ht 5' 0.68" (1.541 m)   Wt 122 lb 6.4 oz (55.5 kg)   SpO2 97%   BMI 23.37 kg/m  Wt Readings from Last 3 Encounters:  04/13/20 122 lb 6.4 oz (55.5 kg)  11/13/19 121 lb (54.9 kg)  03/11/19 134 lb 0.6 oz (60.8 kg)     Lab Results  Component Value Date   WBC 7.8 03/09/2019   HGB 11.9 (L) 03/09/2019   HCT 35.0 (L) 03/09/2019   PLT 246 03/09/2019   GLUCOSE 105 (H) 04/13/2020   CHOL 151 04/13/2020   TRIG 155.0 (H) 04/13/2020   HDL 42.90 04/13/2020   LDLDIRECT 74.9 04/11/2011   LDLCALC 77 04/13/2020   ALT 10 04/13/2020   AST 18 04/13/2020   NA 139 04/13/2020   K 4.0 04/13/2020   CL 102 04/13/2020   CREATININE 1.26 04/13/2020   BUN 22 04/13/2020   CO2 30 04/13/2020   TSH 1.90  04/13/2020   PSA 3.76 04/11/2011   INR 1.0 03/09/2019   HGBA1C 6.3 (H) 12/11/2017    MR Lumbar Spine w/o contrast  Result Date: 04/19/2019 CLINICAL DATA:  Four months of low back pain radiating to the right leg EXAM: MRI LUMBAR SPINE WITHOUT CONTRAST TECHNIQUE: Multiplanar, multisequence MR imaging of the lumbar spine was performed. No intravenous contrast was administered. COMPARISON:  Lumbar radiography 01/15/2019 FINDINGS: Segmentation:  5 lumbar type vertebrae Alignment: Grade 1 anterolisthesis at L3-4 and L4-5. Mild levoscoliosis Vertebrae:  No fracture, evidence of discitis, or bone lesion. Conus medullaris and cauda equina: Conus extends to the L1-2 level. Conus and cauda equina appear normal. Paraspinal and other soft tissues: Right renal cystic intensities. Disc levels: T12- L1: Unremarkable. L1-L2: Unremarkable. L2-L3: Mild spondylosis and minimal annulus bulging L3-L4: Advanced facet arthropathy with spurring, joint distortion with anterolisthesis. There is a degenerative thick wall ganglion in the posterior midline measuring 1 cm and causing high-grade spinal stenosis. The disc is narrowed and bulging, asymmetric to the right where there is moderate foraminal impingement. Left foraminal narrowing is mild. L4-L5: Advanced facet arthropathy with spurring and anterolisthesis. The disc is narrowed and bulging with annular fissures. Advanced and compressive spinal stenosis. Moderate right foraminal narrowing. L5-S1:Unremarkable. IMPRESSION: 1. L3-4 and L4-5 advanced facet arthropathy with anterolisthesis and focal disc degeneration. 2. L3-4 high-grade spinal stenosis primarily related to a degenerative ligamentum flavum ganglion. There is also right foraminal impingement 3. L4-5 advanced spinal stenosis. Moderate right foraminal narrowing. 4. Mild levoscoliosis. Electronically Signed   By: Monte Fantasia M.D.   On: 04/19/2019 20:08     Assessment & Plan:  Plan  I have changed [name]'s  methotrexate. I am also having him start on NONFORMULARY OR COMPOUNDED ITEM. Additionally, I am having him maintain his fluticasone, losartan-hydrochlorothiazide, Vitamin D (Ergocalciferol), [employer] Natural Tears, multivitamin with minerals, Glucosamine HCl (GLUCOSAMINE PO), folic acid, NONFORMULARY OR COMPOUNDED ITEM, furosemide, gemfibrozil, pantoprazole, and levothyroxine.  Meds ordered this encounter  Medications  . NONFORMULARY OR COMPOUNDED ITEM    Sig: rollator walker #1  Dx balance issues, frequent falls    Dispense:  1 each    Refill:  0  . methotrexate (RHEUMATREX) 2.5 MG tablet    Sig: Take 6 tablets (15 mg total) by mouth every Saturday.    Dispense:  24 tablet    Refill:  5    Problem List Items Addressed This Visit      Unprioritized   Acute pain of right shoulder - Primary    Pt recently reinjured r shoulder  F/u ortho       Relevant Orders   Ambulatory referral to Orthopedic Surgery   Essential hypertension (Chronic)    Well controlled, no changes to meds. Encouraged heart healthy diet such as the DASH diet and exercise as tolerated.       Frequent falls    Home health referral placed       Relevant Medications   NONFORMULARY OR COMPOUNDED ITEM   Hypothyroidism (Chronic)    con't synthroid Check labs       Relevant Orders   Thyroid Panel With TSH (Completed)   Rheumatoid arthritis ([place]) (Chronic)    Pt needs a rheumatologist f/u Refill methotrexate      Relevant Medications   methotrexate (RHEUMATREX) 2.5 MG tablet (Start on 04/17/2020)   Other Relevant Orders   Ambulatory referral to Rheumatology    Other Visit Diagnoses    Need for influenza vaccination       Relevant Orders   Flu Vaccine QUAD High Dose(Fluad) (Completed)   Vitamin D deficiency       Relevant Orders   VITAMIN D 25 Hydroxy (Vit-D Deficiency, Fractures) (Completed)   Primary hypertension       Relevant Orders   Lipid panel (Completed)   Comprehensive metabolic panel  (Completed)      Follow-up: No follow-ups  on file.  [name], DO

## 2020-04-27 ENCOUNTER — Other Ambulatory Visit: Payer: Self-pay | Admitting: Family Medicine

## 2020-04-27 DIAGNOSIS — M0579 Rheumatoid arthritis with rheumatoid factor of multiple sites without organ or systems involvement: Secondary | ICD-10-CM

## 2020-04-27 NOTE — Telephone Encounter (Signed)
I referred to rheum for this---  was app not made?

## 2020-04-29 ENCOUNTER — Other Ambulatory Visit: Payer: Self-pay | Admitting: Family Medicine

## 2020-05-13 ENCOUNTER — Other Ambulatory Visit: Payer: Self-pay | Admitting: Family Medicine

## 2020-05-13 DIAGNOSIS — R600 Localized edema: Secondary | ICD-10-CM

## 2020-06-08 IMAGING — CT CT HEAD W/O CM
3 series · 15 of 47 positions shown, 18 images · non-contrast
Comparison: March 10, 2019.

CLINICAL DATA: Follow-up subarachnoid hemorrhage.

EXAM:
CT HEAD WITHOUT CONTRAST
TECHNIQUE: Contiguous axial images were obtained from the base of the skull
through the vertex without intravenous contrast.

[Series 3: head 5.0 h30s · axial · 0.44mm/px · z∈[-192,-62]mm · 9 of 32 slices shown, 12 images]
[im 3/32  brain]
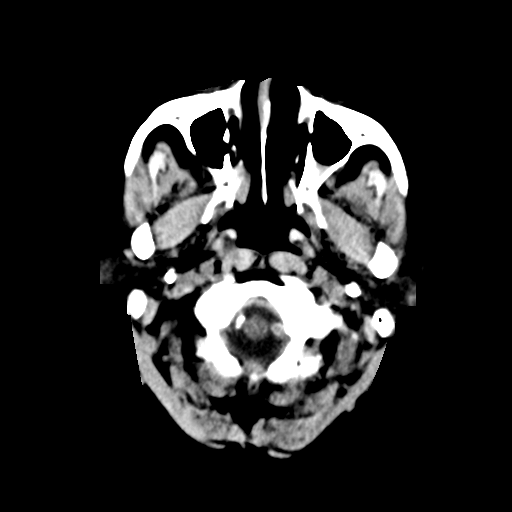
[im 3/32  bone]
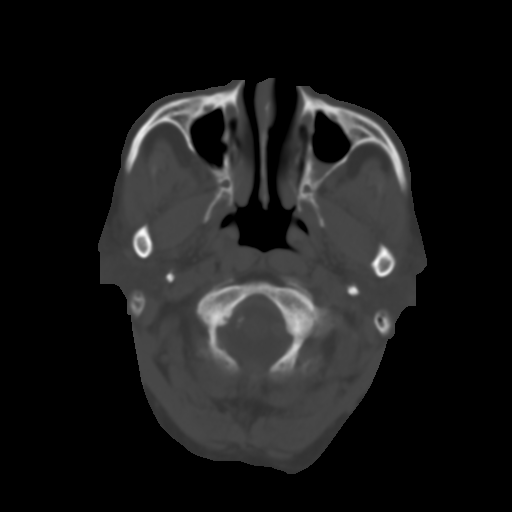
[im 6/32  brain]
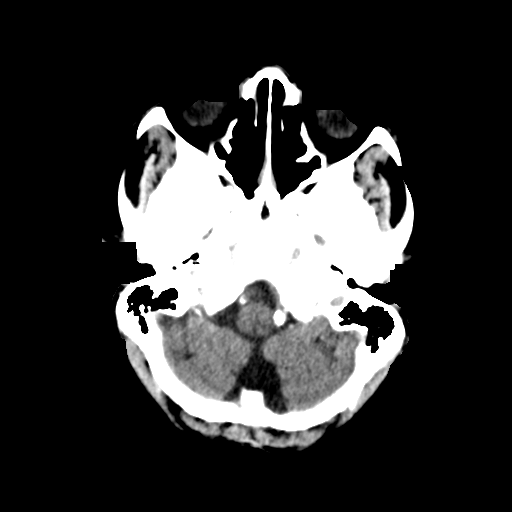
[im 9/32  brain]
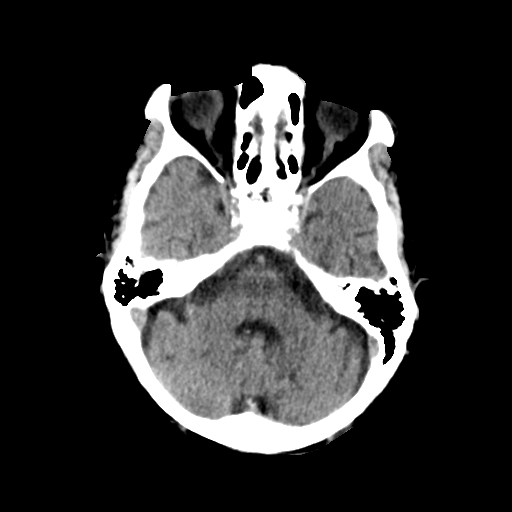
[im 12/32  brain]
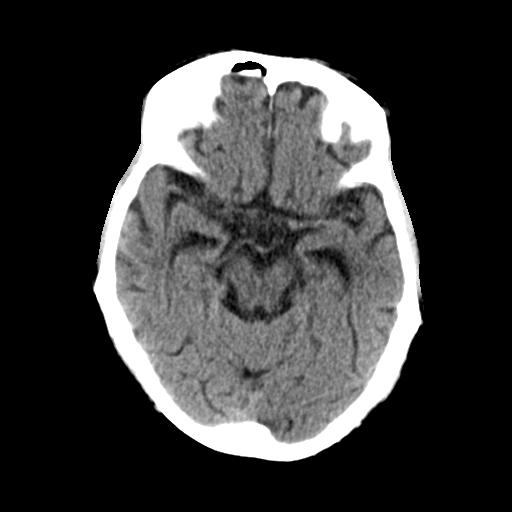
[im 17/32  brain]
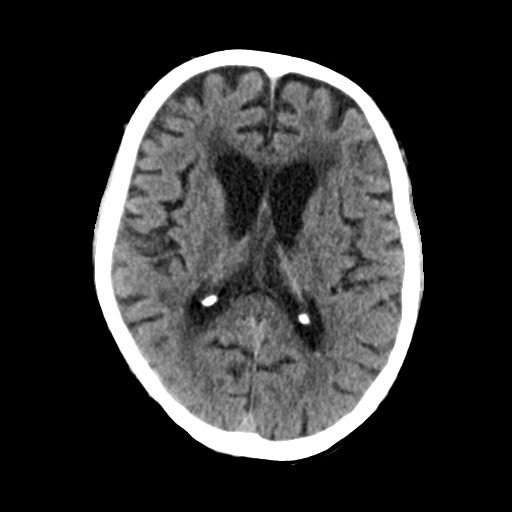
[im 17/32  bone]
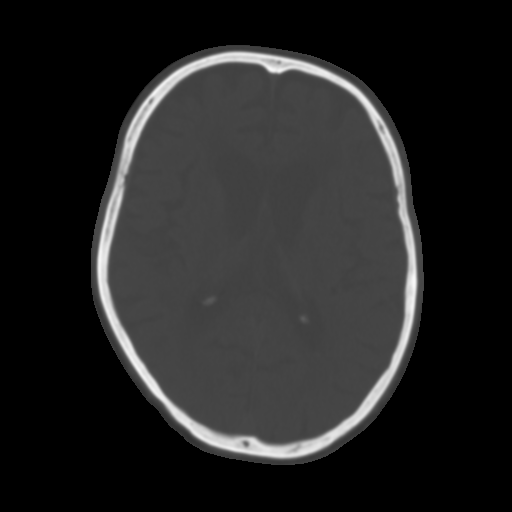
[im 20/32  brain]
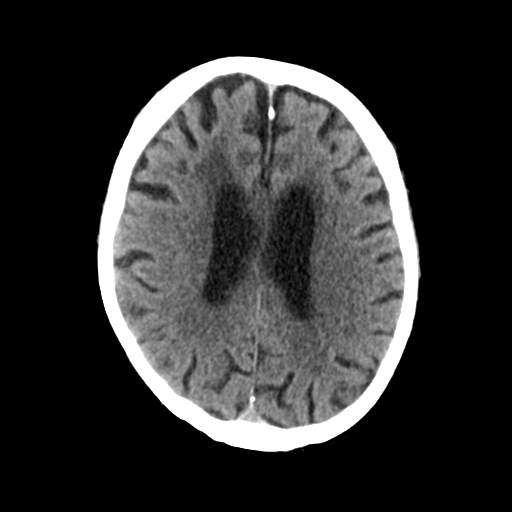
[im 23/32  brain]
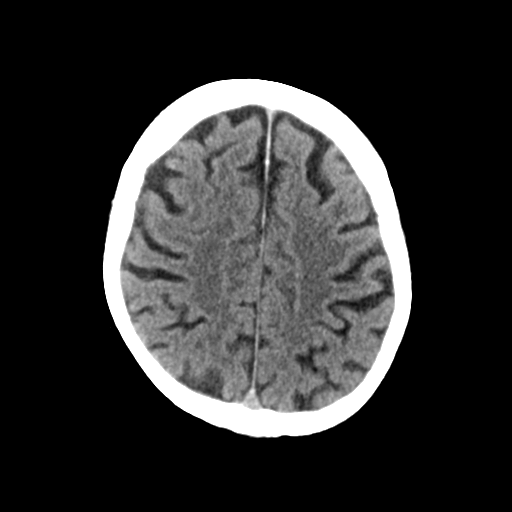
[im 26/32  brain]
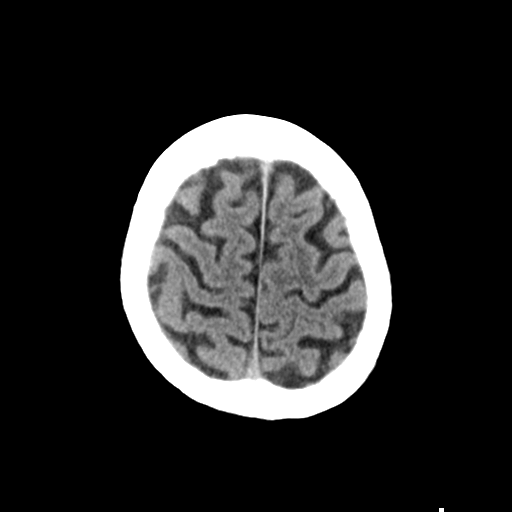
[im 29/32  brain]
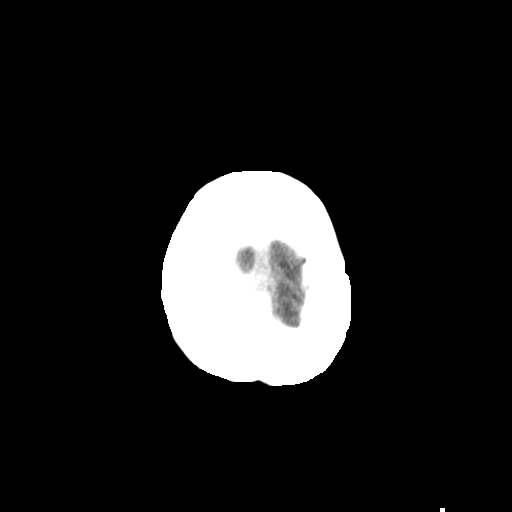
[im 29/32  bone]
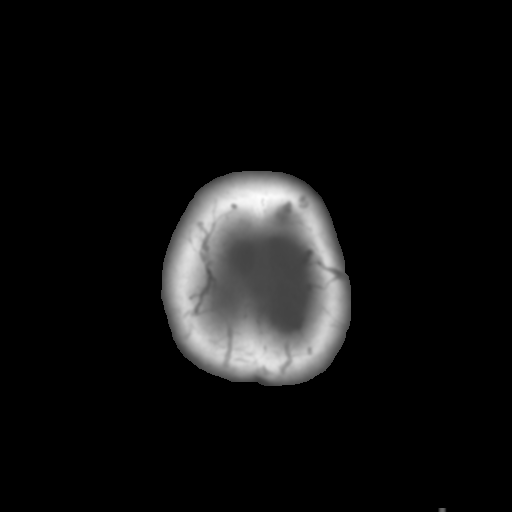

[Series 5: head 3.0 mpr cor · coronal · 0.31mm/px · 3 of 67 slices shown]
[im 23/67  brain]
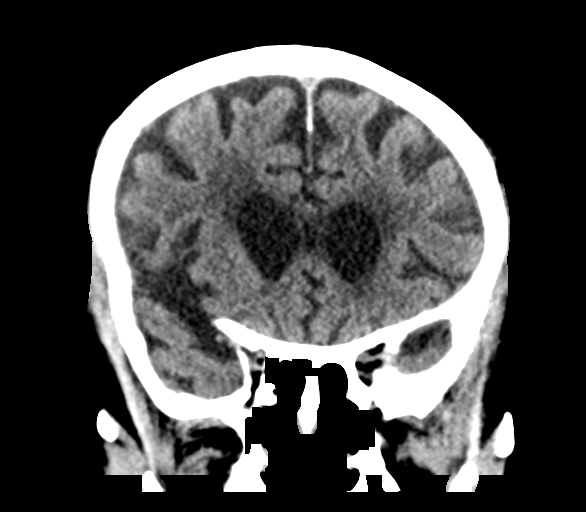
[im 30/67  brain]
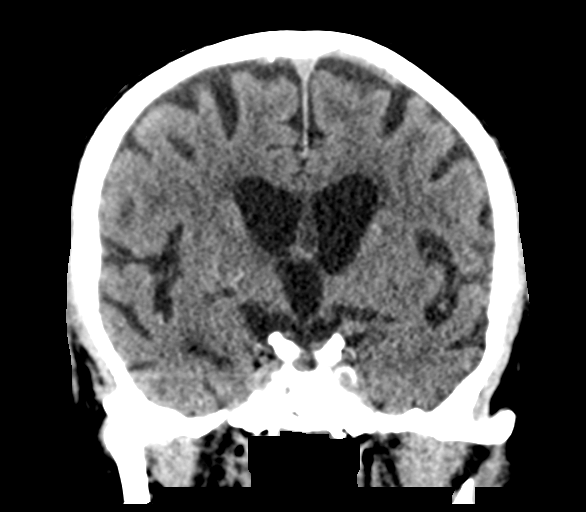
[im 37/67  brain]
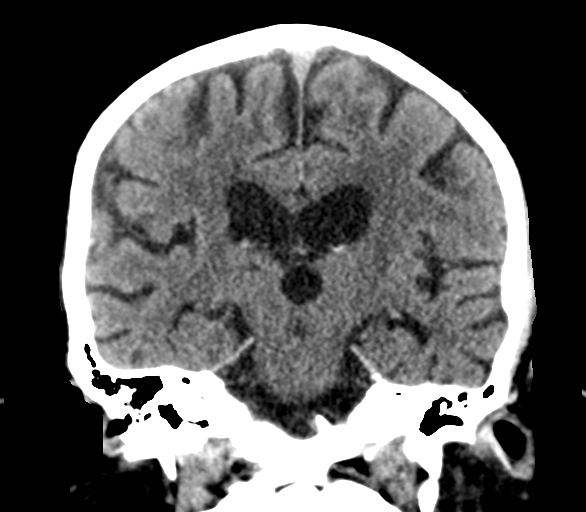

[Series 6: head 3.0 mpr sag · sagittal · 0.32mm/px · 3 of 66 slices shown]
[im 22/66  brain]
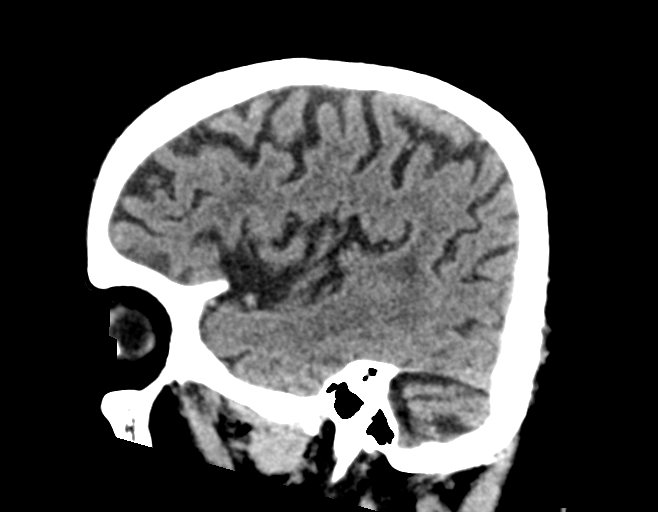
[im 33/66  brain]
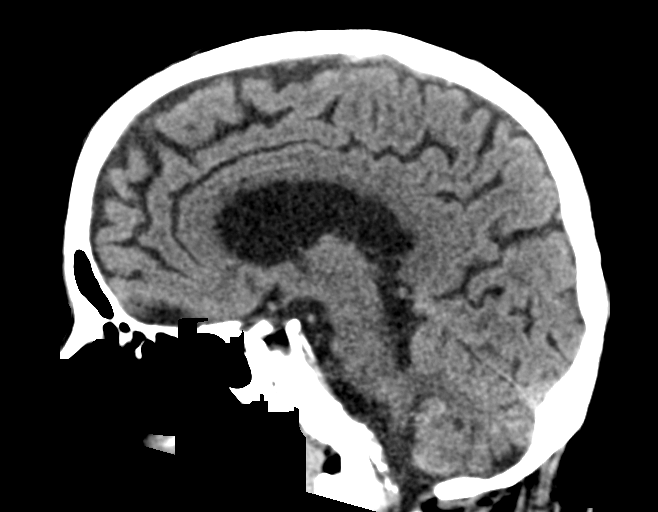
[im 44/66  brain]
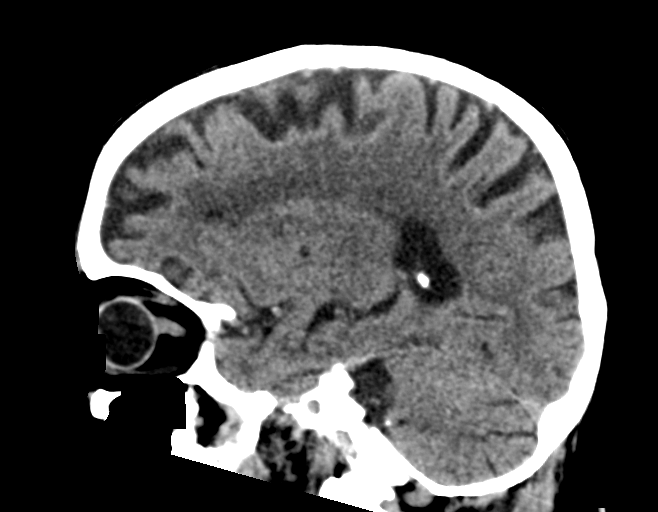

[15 of 47 positions shown; findings below may reference images not displayed]

FINDINGS: Brain: Mild diffuse cortical atrophy is noted. Mild chronic ischemic
white matter disease is noted. Subarachnoid hemorrhage seen in the
basal cistern region has resolved. Small residual amount of
intraventricular hemorrhage is noted in the left posterior horn. No
mass effect or midline shift is noted. Ventricular size is within
normal limits. No acute infarction or mass lesion is noted.

Vascular: No hyperdense vessel or unexpected calcification.

Skull: Normal. Negative for fracture or focal lesion.

Sinuses/Orbits: No acute finding.

Other: None.
IMPRESSION: Small amount of residual intraventricular hemorrhage remains within
the left posterior horn. Subarachnoid hemorrhage noted on prior exam
has resolved. Mild diffuse cortical atrophy is noted with mild
chronic ischemic white matter disease.

## 2020-06-28 ENCOUNTER — Other Ambulatory Visit: Payer: Self-pay | Admitting: Family Medicine

## 2020-06-28 MED ORDER — LOSARTAN POTASSIUM-HCTZ 50-12.5 MG PO TABS
1.0000 | ORAL_TABLET | Freq: Every day | ORAL | 1 refills | Status: DC
Start: 2020-06-28 — End: 2020-12-28

## 2020-07-11 ENCOUNTER — Other Ambulatory Visit: Payer: Self-pay | Admitting: Family Medicine

## 2020-07-11 DIAGNOSIS — M0579 Rheumatoid arthritis with rheumatoid factor of multiple sites without organ or systems involvement: Secondary | ICD-10-CM

## 2020-08-06 ENCOUNTER — Ambulatory Visit (INDEPENDENT_AMBULATORY_CARE_PROVIDER_SITE_OTHER): Payer: Medicare Other | Admitting: Medical

## 2020-08-06 ENCOUNTER — Other Ambulatory Visit: Payer: Self-pay

## 2020-08-06 VITALS — BP 139/56 | HR 69 | Resp 18 | Ht 60.0 in | Wt 135.0 lb

## 2020-08-06 DIAGNOSIS — R5383 Other fatigue: Secondary | ICD-10-CM

## 2020-08-06 DIAGNOSIS — M0579 Rheumatoid arthritis with rheumatoid factor of multiple sites without organ or systems involvement: Secondary | ICD-10-CM

## 2020-08-06 DIAGNOSIS — J3489 Other specified disorders of nose and nasal sinuses: Secondary | ICD-10-CM | POA: Diagnosis not present

## 2020-08-06 DIAGNOSIS — J028 Acute pharyngitis due to other specified organisms: Secondary | ICD-10-CM

## 2020-08-06 LAB — POCT RAPID STREP A (OFFICE): Rapid Strep A Screen: NEGATIVE

## 2020-08-06 MED ORDER — AZITHROMYCIN 250 MG PO TABS: ORAL_TABLET | ORAL | 0 refills | Status: DC

## 2020-08-06 MED ORDER — LIDOCAINE VISCOUS HCL 2 % MT SOLN: OROMUCOSAL | 0 refills | Status: DC

## 2020-08-06 NOTE — Progress Notes (Signed)
Subjective:    Patient ID: [name], male    DOB: November 01, 1928, 87 y.o.   MRN: [id number]  HPI  Pt in for first time.  Pt has just now recently st since Monday. Pt has pain when he eats and drinks. Can swallow. No excess salivation.   Pt has hx of RA. Saw rheumatologist about 2 years ago. Pt was off his methotrexate one week ago 6 tabs on Sunday. Takes weekly.      Review of Systems  Constitutional: Positive for fatigue. Negative for chills and fever.  HENT: Positive for sinus pressure and sore throat. Negative for congestion and sinus pain.   Respiratory: Negative for cough, shortness of breath and wheezing.   Cardiovascular: Negative for chest pain and palpitations.  Gastrointestinal: Negative for abdominal pain.  Musculoskeletal: Negative for back pain.  Skin: Negative for rash.  Neurological: Negative for dizziness and headaches.  Hematological: Negative for adenopathy. Does not bruise/bleed easily.  Psychiatric/Behavioral: Negative for behavioral problems and confusion.    Past Medical History:  Diagnosis Date  . Adenocarcinoma ([place]) 1985   colon s/p right hemicolectomy and chemotherapy. F/U with cancer center and Dr. Lucia Gaskins rotinely  . Depression   . Eczema   . Epistaxis    f/u per ENT  . Erectile dysfunction   . Gastric ulcer 12/11   H-Pylori Tx, EGD 3-12: gastritis  . Hay fever   . Head injury 11/2017   with fall  . Hyperlipemia   . Hypertension   . Hypothyroidism    Hypothyroid  . Insomnia   . Osteopenia     per DEXA 07-2008 (Rx fosamax)  . Rheumatoid arthritis ([place])   . Stroke [facility])     " balance issue"     Social History   Socioeconomic History  . Marital status: Married    Spouse name: Not on file  . Number of children: 2  . Years of education: Not on file  . Highest education level: Not on file  Occupational History    Employer: RETIRED  Tobacco Use  . Smoking status: Never Smoker  . Smokeless tobacco: Never Used  Vaping Use  . Vaping  Use: Never used  Substance and Sexual Activity  . Alcohol use: No    Alcohol/week: 0.0 standard drinks  . Drug use: No  . Sexual activity: Not on file  Other Topics Concern  . Not on file  Social History Narrative   Original from [place]   Vegetarian   Daily caffeine use one per day   Social Determinants of Health   Financial Resource Strain: Not on file  Food Insecurity: Not on file  Transportation Needs: Not on file  Physical Activity: Not on file  Stress: Not on file  Social Connections: Not on file  Intimate Partner Violence: Not on file    Past Surgical History:  Procedure Laterality Date  . COLON SURGERY     Right / Adenocarcinoma / Chemo  . COLONOSCOPY  2005, 2008   Dr. Lucia Gaskins  . INGUINAL HERNIA REPAIR     Right  . IR ANGIO INTRA EXTRACRAN SEL COM CAROTID INNOMINATE BILAT MOD SED  12/13/2017  . IR ANGIO VERTEBRAL SEL VERTEBRAL BILAT MOD SED  12/13/2017  . IR PTA INTRACRANIAL  12/18/2017  . RADIOLOGY WITH ANESTHESIA N/A 12/18/2017   Procedure: Angioplasty with stenting;  Surgeon: Luanne Bras, MD;  Location: [place];  Service: Radiology;  Laterality: N/A;    Family History  Problem Relation Age of Onset  .  Diabetes Neg Hx   . Heart attack Neg Hx   . Colon cancer Neg Hx   . Prostate cancer Neg Hx     Allergies  Allergen Reactions  . Asa [Aspirin] Other (See Comments)    Gastric symptoms can tolerate the baby ASA but no more than that. Pt. Is taking ASA 81mg  now.  . Penicillin G Rash    Did it involve swelling of the face/tongue/throat, SOB, or low BP? Unknown Did it involve sudden or severe rash/hives, skin peeling, or any reaction on the inside of your mouth or nose? Unknown Did you need to seek medical attention at a hospital or doctor's office? Unknown When did it last happen?unknown If all above answers are "NO", may proceed with cephalosporin use.    Current Outpatient Medications on File Prior to Visit  Medication Sig Dispense Refill  .  Dextran 70-Hypromellose, PF, ([employer] NATURAL TEARS) 0.1-0.3 % SOLN Place 1 drop into both eyes at bedtime.    . fluticasone (FLONASE) 50 MCG/ACT nasal spray Place 2 sprays into the nose daily as needed (seasonal allergies).    . folic acid (FOLVITE) 1 MG tablet Take 1 mg by mouth every morning.    . furosemide (LASIX) 20 MG tablet Take 1 tablet (20 mg total) by mouth daily. 90 tablet 1  . gemfibrozil (LOPID) 600 MG tablet Take 1 tablet (600 mg total) by mouth daily. 90 tablet 1  . Glucosamine HCl (GLUCOSAMINE PO) Take 1 tablet by mouth every morning.    Marland Kitchen levothyroxine (SYNTHROID) 100 MCG tablet Take 1 tablet (100 mcg total) by mouth daily before breakfast. 90 tablet 1  . losartan-hydrochlorothiazide (HYZAAR) 50-12.5 MG tablet Take 1 tablet by mouth daily. 90 tablet 1  . methotrexate (RHEUMATREX) 2.5 MG tablet TAKE 6 TABLETS (15 MG TOTAL) BY MOUTH EVERY SATURDAY. 72 tablet 2  . Multiple Vitamin (MULTIVITAMIN WITH MINERALS) TABS tablet Take 1 tablet by mouth every morning.    . NONFORMULARY OR COMPOUNDED ITEM Compression stocking   20-30 mg / hg   #1   Dx low ext edema 1 each 2  . NONFORMULARY OR COMPOUNDED ITEM rollator walker #1  Dx balance issues, frequent falls 1 each 0  . pantoprazole (PROTONIX) 40 MG tablet TAKE 1 TABLET BY MOUTH EVERY DAY 15 tablet 0  . Vitamin D, Ergocalciferol, (DRISDOL) 50000 units CAPS capsule Take 50,000 Units by mouth every Saturday.      No current facility-administered medications on file prior to visit.    BP (!) 139/56   Pulse 69   Resp 18   Ht 5' (1.524 m)   Wt 135 lb (61.2 kg)   SpO2 99%   BMI 26.37 kg/m       Objective:   Physical Exam   General Mental Status- Alert. General Appearance- Not in acute distress.   Skin General: Color- Normal Color. Moisture- Normal Moisture.  Neck Carotid Arteries- Normal color. Moisture- Normal Moisture. No carotid bruits. No JVD.  Chest and Lung Exam Auscultation: Breath  Sounds:-Normal.  Cardiovascular Auscultation:Rythm- Regular. Murmurs & Other Heart Sounds:Auscultation of the heart reveals- No Murmurs.  Abdomen Inspection:-Inspeection Normal. Palpation/Percussion:Note:No mass. Palpation and Percussion of the abdomen reveal- Non Tender, Non Distended + BS, no rebound or guarding.    Neurologic Cranial Nerve exam:- CN III-XII intact(No nystagmus), symmetric smile. Strength:- 5/5 equal and symmetric strength both upper and lower extremities.   HEENT-no frontal sinus pressure palpation but does have maxillary sinus pressure bilaterally.  Posterior pharynx shows very  beefy red posterior pharynx/tonsillar area.  No white discharge or purulent discharge seen.  Tongue is clear from any obvious arthritis.  No aphthous ulcer seen.  The airway is open.  Submandibular nodes not obviously swollen.  Ears-canals clear normal TMs.    Assessment & Plan:  You do have recent sore throat with beefy red appearance to posterior pharynx as well as maxillary sinus pressure.  I will prescribe a azithromycin antibiotic today.  Please start med today.  Your rapid strep test came back negative but we are also sending out throat culture.  For sore throat would recommend low-dose Tylenol every 6-8 hours as needed.  Could also use that in combination with low-dose ibuprofen 200 to 400 mg every 6-8 hours.  Want to make sure that you are throat is comfortable and that you are able to eat and drink.  If not concerned that you might get dehydrated.  History of rheumatoid arthritis and you had been off of methotrexate for a while then restarted prior to onset of sore throat.  Methotrexate is an immune suppressant so some concern for infection.  Bacterial versus fungal.  Presently doubt fungal infection.  No thrush appearance to the tongue.  We will see how you respond to the antibiotic azithromycin and follow culture.  Might consider prescription of Diflucan early next week.  Please give  [place] an update on how you doing Monday.  If signs and symptoms worsen or change over the weekend then recommend ED evaluation.

## 2020-08-06 NOTE — Patient Instructions (Addendum)
You do have recent sore throat with beefy red appearance to posterior pharynx as well as maxillary sinus pressure.  I will prescribe a azithromycin antibiotic today.  Please start med today.  Your rapid strep test came back negative but we are also sending out throat culture.  For sore throat would recommend low-dose Tylenol every 6-8 hours as needed.  Could also use that in combination with low-dose ibuprofen 200 to 400 mg every 6-8 hours.  Want to make sure that you are throat is comfortable and that you are able to eat and drink.  If not  Then concerned that you might get dehydrated.  History of rheumatoid arthritis and you had been off of methotrexate for a while then restarted prior to onset of sore throat.  Methotrexate is an immune suppressant so some concern for infection.  Bacterial versus fungal.  Presently doubt fungal infection.  No thrush appearance to the tongue.  We will see how you respond to the antibiotic azithromycin and follow culture.  Might consider prescription of Diflucan early next week.  Would hold off taking methotrexate presently until we evaluate how you are doing with above treatment and will review labs.  CBC and CMP labs ordered today.  Please give [place] an update on how you doing Monday.  If signs and symptoms worsen or change over the weekend then recommend ED evaluation.

## 2020-08-07 ENCOUNTER — Encounter (HOSPITAL_BASED_OUTPATIENT_CLINIC_OR_DEPARTMENT_OTHER): Payer: Self-pay | Admitting: Emergency Medicine

## 2020-08-07 ENCOUNTER — Other Ambulatory Visit: Payer: Self-pay

## 2020-08-07 ENCOUNTER — Emergency Department (HOSPITAL_BASED_OUTPATIENT_CLINIC_OR_DEPARTMENT_OTHER): Payer: Medicare Other

## 2020-08-07 ENCOUNTER — Inpatient Hospital Stay (HOSPITAL_BASED_OUTPATIENT_CLINIC_OR_DEPARTMENT_OTHER)
Admission: EM | Admit: 2020-08-07 | Discharge: 2020-08-13 | DRG: 157 | Disposition: A | Discharge status: Home Health | Payer: Medicare Other | Attending: Internal Medicine | Admitting: Internal Medicine

## 2020-08-07 DIAGNOSIS — N183 Chronic kidney disease, stage 3 unspecified: Secondary | ICD-10-CM | POA: Diagnosis present

## 2020-08-07 DIAGNOSIS — N400 Enlarged prostate without lower urinary tract symptoms: Secondary | ICD-10-CM | POA: Diagnosis not present

## 2020-08-07 DIAGNOSIS — D61811 Other drug-induced pancytopenia: Secondary | ICD-10-CM | POA: Diagnosis present

## 2020-08-07 DIAGNOSIS — R6510 Systemic inflammatory response syndrome (SIRS) of non-infectious origin without acute organ dysfunction: Secondary | ICD-10-CM | POA: Diagnosis present

## 2020-08-07 DIAGNOSIS — Z886 Allergy status to analgesic agent status: Secondary | ICD-10-CM

## 2020-08-07 DIAGNOSIS — Z79899 Other long term (current) drug therapy: Secondary | ICD-10-CM

## 2020-08-07 DIAGNOSIS — Z20822 Contact with and (suspected) exposure to covid-19: Secondary | ICD-10-CM | POA: Diagnosis present

## 2020-08-07 DIAGNOSIS — Z9221 Personal history of antineoplastic chemotherapy: Secondary | ICD-10-CM | POA: Diagnosis not present

## 2020-08-07 DIAGNOSIS — R413 Other amnesia: Secondary | ICD-10-CM | POA: Diagnosis not present

## 2020-08-07 DIAGNOSIS — M069 Rheumatoid arthritis, unspecified: Secondary | ICD-10-CM | POA: Diagnosis not present

## 2020-08-07 DIAGNOSIS — E876 Hypokalemia: Secondary | ICD-10-CM | POA: Diagnosis not present

## 2020-08-07 DIAGNOSIS — N179 Acute kidney failure, unspecified: Secondary | ICD-10-CM | POA: Diagnosis not present

## 2020-08-07 DIAGNOSIS — J302 Other seasonal allergic rhinitis: Secondary | ICD-10-CM | POA: Diagnosis present

## 2020-08-07 DIAGNOSIS — Z880 Allergy status to penicillin: Secondary | ICD-10-CM

## 2020-08-07 DIAGNOSIS — I10 Essential (primary) hypertension: Secondary | ICD-10-CM

## 2020-08-07 DIAGNOSIS — K121 Other forms of stomatitis: Principal | ICD-10-CM | POA: Diagnosis present

## 2020-08-07 DIAGNOSIS — D631 Anemia in chronic kidney disease: Secondary | ICD-10-CM | POA: Diagnosis present

## 2020-08-07 DIAGNOSIS — G4700 Insomnia, unspecified: Secondary | ICD-10-CM | POA: Diagnosis present

## 2020-08-07 DIAGNOSIS — I129 Hypertensive chronic kidney disease with stage 1 through stage 4 chronic kidney disease, or unspecified chronic kidney disease: Secondary | ICD-10-CM | POA: Diagnosis present

## 2020-08-07 DIAGNOSIS — Z85038 Personal history of other malignant neoplasm of large intestine: Secondary | ICD-10-CM

## 2020-08-07 DIAGNOSIS — J9601 Acute respiratory failure with hypoxia: Secondary | ICD-10-CM | POA: Diagnosis not present

## 2020-08-07 DIAGNOSIS — N1831 Chronic kidney disease, stage 3a: Secondary | ICD-10-CM | POA: Diagnosis present

## 2020-08-07 DIAGNOSIS — E039 Hypothyroidism, unspecified: Secondary | ICD-10-CM | POA: Diagnosis not present

## 2020-08-07 DIAGNOSIS — Z9049 Acquired absence of other specified parts of digestive tract: Secondary | ICD-10-CM | POA: Diagnosis not present

## 2020-08-07 DIAGNOSIS — E860 Dehydration: Secondary | ICD-10-CM | POA: Diagnosis present

## 2020-08-07 DIAGNOSIS — D702 Other drug-induced agranulocytosis: Secondary | ICD-10-CM

## 2020-08-07 DIAGNOSIS — M8580 Other specified disorders of bone density and structure, unspecified site: Secondary | ICD-10-CM | POA: Diagnosis not present

## 2020-08-07 DIAGNOSIS — Z888 Allergy status to other drugs, medicaments and biological substances status: Secondary | ICD-10-CM | POA: Diagnosis not present

## 2020-08-07 DIAGNOSIS — E785 Hyperlipidemia, unspecified: Secondary | ICD-10-CM | POA: Diagnosis present

## 2020-08-07 DIAGNOSIS — R0902 Hypoxemia: Secondary | ICD-10-CM

## 2020-08-07 DIAGNOSIS — D72819 Decreased white blood cell count, unspecified: Secondary | ICD-10-CM | POA: Diagnosis present

## 2020-08-07 DIAGNOSIS — Z79890 Hormone replacement therapy (postmenopausal): Secondary | ICD-10-CM

## 2020-08-07 DIAGNOSIS — T451X5A Adverse effect of antineoplastic and immunosuppressive drugs, initial encounter: Secondary | ICD-10-CM | POA: Diagnosis present

## 2020-08-07 LAB — COMPREHENSIVE METABOLIC PANEL
ALT: 22 IU/L (ref 0–44)
ALT: 24 U/L (ref 0–44)
AST: 26 IU/L (ref 0–40)
AST: 33 U/L (ref 15–41)
Albumin/Globulin Ratio: 1.5 (ref 1.2–2.2)
Albumin: 4.5 g/dL (ref 3.5–4.6)
Albumin: 4 g/dL (ref 3.5–5.0)
Alkaline Phosphatase: 96 IU/L (ref 44–121)
Alkaline Phosphatase: 67 U/L (ref 38–126)
Anion gap: 12 (ref 5–15)
BUN/Creatinine Ratio: 22 (ref 10–24)
BUN: 28 mg/dL (ref 10–36)
BUN: 32 mg/dL — ABNORMAL HIGH (ref 8–23)
Bilirubin Total: 0.5 mg/dL (ref 0.0–1.2)
CO2: 21 mmol/L (ref 20–29)
CO2: 25 mmol/L (ref 22–32)
Calcium: 9.4 mg/dL (ref 8.6–10.2)
Calcium: 9.6 mg/dL (ref 8.9–10.3)
Chloride: 100 mmol/L (ref 96–106)
Chloride: 99 mmol/L (ref 98–111)
Creatinine, Ser: 1.25 mg/dL (ref 0.76–1.27)
Creatinine, Ser: 1.52 mg/dL — ABNORMAL HIGH (ref 0.61–1.24)
GFR, Estimated: 43 mL/min — ABNORMAL LOW (ref >60)
Globulin, Total: 3.1 g/dL (ref 1.5–4.5)
Glucose, Bld: 148 mg/dL — ABNORMAL HIGH (ref 70–99)
Glucose: 101 mg/dL — ABNORMAL HIGH (ref 65–99)
Potassium: 4.1 mmol/L (ref 3.5–5.2)
Potassium: 3.6 mmol/L (ref 3.5–5.1)
Sodium: 139 mmol/L (ref 134–144)
Sodium: 136 mmol/L (ref 135–145)
Total Bilirubin: 0.6 mg/dL (ref 0.3–1.2)
Total Protein: 7.6 g/dL (ref 6.0–8.5)
Total Protein: 8.4 g/dL — ABNORMAL HIGH (ref 6.5–8.1)
eGFR: 55 mL/min/{1.73_m2} — ABNORMAL LOW (ref >59)

## 2020-08-07 LAB — CBC WITH DIFFERENTIAL/PLATELET
Basophils Absolute: 0 10*3/uL (ref 0.0–0.2)
Basos: 0 %
EOS (ABSOLUTE): 0.1 10*3/uL (ref 0.0–0.4)
Eos: 1 %
Hematocrit: 39 % (ref 37.5–51.0)
Hemoglobin: 13.2 g/dL (ref 13.0–17.7)
Immature Grans (Abs): 0.1 10*3/uL (ref 0.0–0.1)
Immature Granulocytes: 1 %
Lymphocytes Absolute: 2.6 10*3/uL (ref 0.7–3.1)
Lymphs: 32 %
MCH: 30.3 pg (ref 26.6–33.0)
MCHC: 33.8 g/dL (ref 31.5–35.7)
MCV: 90 fL (ref 79–97)
Monocytes Absolute: 0.2 10*3/uL (ref 0.1–0.9)
Monocytes: 2 %
Neutrophils Absolute: 5.3 10*3/uL (ref 1.4–7.0)
Neutrophils: 64 %
Platelets: 200 10*3/uL (ref 150–450)
RBC: 4.35 x10E6/uL (ref 4.14–5.80)
RDW: 12.1 % (ref 11.6–15.4)
WBC: 8.2 10*3/uL (ref 3.4–10.8)

## 2020-08-07 LAB — CBC
HCT: 37.9 % — ABNORMAL LOW (ref 39.0–52.0)
Hemoglobin: 12.6 g/dL — ABNORMAL LOW (ref 13.0–17.0)
MCH: 29.9 pg (ref 26.0–34.0)
MCHC: 33.2 g/dL (ref 30.0–36.0)
MCV: 90 fL (ref 80.0–100.0)
Platelets: 164 10*3/uL (ref 150–400)
RBC: 4.21 MIL/uL — ABNORMAL LOW (ref 4.22–5.81)
RDW: 12.1 % (ref 11.5–15.5)
WBC: 2.4 10*3/uL — ABNORMAL LOW (ref 4.0–10.5)
nRBC: 0 % (ref 0.0–0.2)

## 2020-08-07 LAB — RESP PANEL BY RT-PCR (FLU A&B, COVID) ARPGX2
Influenza A by PCR: NEGATIVE
Influenza B by PCR: NEGATIVE
SARS Coronavirus 2 by RT PCR: NEGATIVE

## 2020-08-07 LAB — APTT: aPTT: 36 seconds (ref 24–36)

## 2020-08-07 LAB — PROTIME-INR
INR: 1.1 (ref 0.8–1.2)
Prothrombin Time: 13.6 seconds (ref 11.4–15.2)

## 2020-08-07 LAB — LIPASE, BLOOD: Lipase: 30 U/L (ref 11–51)

## 2020-08-07 LAB — LACTIC ACID, PLASMA: Lactic Acid, Venous: 1.5 mmol/L (ref 0.5–1.9)

## 2020-08-07 MED ORDER — SODIUM CHLORIDE 0.9 % IV SOLN
1.0000 g | Freq: Once | INTRAVENOUS | Status: AC
  Administered 2020-08-07: 1 g via INTRAVENOUS
  Filled 2020-08-07: qty 10

## 2020-08-07 MED ORDER — MAGIC MOUTHWASH W/LIDOCAINE
5.0000 mL | Freq: Four times a day (QID) | ORAL | Status: DC | PRN
  Administered 2020-08-07: 5 mL via ORAL
  Filled 2020-08-07 (×2): qty 5

## 2020-08-07 MED ORDER — SODIUM CHLORIDE 0.9 % IV SOLN
500.0000 mg | INTRAVENOUS | Status: AC
  Administered 2020-08-08 – 2020-08-10 (×3): 500 mg via INTRAVENOUS
  Filled 2020-08-07 (×3): qty 500

## 2020-08-07 MED ORDER — METHYLPREDNISOLONE SODIUM SUCC 125 MG IJ SOLR
125.0000 mg | Freq: Once | INTRAMUSCULAR | Status: AC
  Administered 2020-08-07: 125 mg via INTRAVENOUS
  Filled 2020-08-07: qty 2

## 2020-08-07 MED ORDER — FOLIC ACID 1 MG PO TABS: 1.0000 mg | ORAL_TABLET | Freq: Every morning | ORAL | Status: DC

## 2020-08-07 MED ORDER — ACETAMINOPHEN 325 MG PO TABS
650.0000 mg | ORAL_TABLET | Freq: Four times a day (QID) | ORAL | Status: DC | PRN
  Administered 2020-08-08 – 2020-08-12 (×2): 650 mg via ORAL
  Filled 2020-08-07 (×2): qty 2

## 2020-08-07 MED ORDER — ONDANSETRON HCL 4 MG/2ML IJ SOLN: 4.0000 mg | Freq: Four times a day (QID) | INTRAMUSCULAR | Status: DC | PRN

## 2020-08-07 MED ORDER — SODIUM CHLORIDE 0.9 % IV SOLN: INTRAVENOUS | Status: AC

## 2020-08-07 MED ORDER — ACETAMINOPHEN 650 MG RE SUPP: 650.0000 mg | Freq: Four times a day (QID) | RECTAL | Status: DC | PRN

## 2020-08-07 MED ORDER — SENNOSIDES-DOCUSATE SODIUM 8.6-50 MG PO TABS: 1.0000 | ORAL_TABLET | Freq: Every evening | ORAL | Status: DC | PRN

## 2020-08-07 MED ORDER — HEPARIN SODIUM (PORCINE) 5000 UNIT/ML IJ SOLN
5000.0000 [IU] | Freq: Three times a day (TID) | INTRAMUSCULAR | Status: DC
  Administered 2020-08-07 – 2020-08-09 (×4): 5000 [IU] via SUBCUTANEOUS
  Filled 2020-08-07 (×3): qty 1

## 2020-08-07 MED ORDER — LACTATED RINGERS IV SOLN: INTRAVENOUS | Status: DC

## 2020-08-07 MED ORDER — SODIUM CHLORIDE 0.9 % IV SOLN
2.0000 g | INTRAVENOUS | Status: AC
  Administered 2020-08-08 – 2020-08-11 (×4): 2 g via INTRAVENOUS
  Filled 2020-08-07 (×4): qty 2

## 2020-08-07 MED ORDER — LACTATED RINGERS IV BOLUS (SEPSIS)
500.0000 mL | Freq: Once | INTRAVENOUS | Status: AC
  Administered 2020-08-07: 500 mL via INTRAVENOUS

## 2020-08-07 MED ORDER — ONDANSETRON HCL 4 MG PO TABS: 4.0000 mg | ORAL_TABLET | Freq: Four times a day (QID) | ORAL | Status: DC | PRN

## 2020-08-07 NOTE — ED Notes (Signed)
Family stated "rash around neck started 2 days ago"

## 2020-08-07 NOTE — ED Provider Notes (Signed)
[name]   CSN: [id number] Arrival date & time: 08/07/20  1533     History Chief Complaint  Patient presents with  . Abdominal Pain  . Cough    [name] is a 87 y.o. male.  Pt is a 87y/o male with hx of RA, HTN, hypothyroid, stroke, prior colon CA, ICH and PUD presenting today with 5 day hx of worsening sore throat/mouth pain, poor oral intake, weakness, fever and now cough.  Pt's daughter and pt give hx.  He reports he took methotrexate on Sunday of last week due to worsening of his RA.  MOnday he woke up with sore throat which daughter has reported has just slowly been getting worse.   He saw the PA yesterday and at that time he received mouth wash and azithromycin but daughter reports today the cough that started 2 days ago was worse and now he was also having trouble breathing.  He has not had rash or itching.  No diarrhea but does report he has abd pain but is unable to localize.  Denies urinary sx.  No known sick contacts.  Patient does report had thinks he had a sore throat with methotrexate when he had taken in the past but had not taken it for quite some time.  Also daughter reports he is very weak today and unable to swallow.  The history is provided by the patient and a relative.  Abdominal Pain Associated symptoms: cough   Cough      Past Medical History:  Diagnosis Date  . Adenocarcinoma ([place]) 1985   colon s/p right hemicolectomy and chemotherapy. F/U with cancer center and Dr. Lucia Gaskins rotinely  . Depression   . Eczema   . Epistaxis    f/u per ENT  . Erectile dysfunction   . Gastric ulcer 12/11   H-Pylori Tx, EGD 3-12: gastritis  . Hay fever   . Head injury 11/2017   with fall  . Hyperlipemia   . Hypertension   . Hypothyroidism    Hypothyroid  . Insomnia   . Osteopenia     per DEXA 07-2008 (Rx fosamax)  . Rheumatoid arthritis ([place])   . Stroke [facility])     " balance issue"    Patient Active Problem List    Diagnosis Date Noted  . Acute pain of right shoulder 04/14/2020  . Lower extremity edema 11/13/2019  . Head trauma 11/13/2019  . Dyslipidemia 11/13/2019  . Memory loss 07/28/2019  . Chronic right shoulder pain 07/28/2019  . Frequent falls 07/28/2019  . Balance problem 07/28/2019  . Dysphagia 07/28/2019  . ICH (intracerebral hemorrhage) ([place]) 03/10/2019  . Basilar artery stenosis with infarction ([place]) 12/18/2017  . TIA (transient ischemic attack) 12/10/2017  . Rheumatoid arthritis ([place])   . Chest pain 06/02/2011  . General medical examination 04/11/2011  . PEPTIC ULCER DISEASE, HELICOBACTER PYLORI POSITIVE 07/12/2010  . PERSONAL HISTORY MALIG NEOPLASM LARGE INTESTINE 05/18/2010  . MITRAL VALVE DISORDERS 04/05/2009  . HOARSENESS 12/04/2008  . OSTEOPENIA 08/26/2008  . HYPERTRIGLYCERIDEMIA 07/23/2008  . EPISTAXIS 07/26/2007  . ERECTILE DYSFUNCTION 02/22/2007  . ECZEMA 12/07/2006  . LEG CRAMPS 10/09/2006  . Hypothyroidism 08/28/2006  . Essential hypertension 08/28/2006  . HAY FEVER 08/28/2006  . INSOMNIA 08/28/2006    Past Surgical History:  Procedure Laterality Date  . COLON SURGERY     Right / Adenocarcinoma / Chemo  . COLONOSCOPY  2005, 2008   Dr. Lucia Gaskins  . INGUINAL HERNIA REPAIR  Right  . IR ANGIO INTRA EXTRACRAN SEL COM CAROTID INNOMINATE BILAT MOD SED  12/13/2017  . IR ANGIO VERTEBRAL SEL VERTEBRAL BILAT MOD SED  12/13/2017  . IR PTA INTRACRANIAL  12/18/2017  . RADIOLOGY WITH ANESTHESIA N/A 12/18/2017   Procedure: Angioplasty with stenting;  Surgeon: Luanne Bras, MD;  Location: [facility];  Service: Radiology;  Laterality: N/A;       Family History  Problem Relation Age of Onset  . Diabetes Neg Hx   . Heart attack Neg Hx   . Colon cancer Neg Hx   . Prostate cancer Neg Hx     Social History   Tobacco Use  . Smoking status: Never Smoker  . Smokeless tobacco: Never Used  Vaping Use  . Vaping Use: Never used  Substance Use Topics  . Alcohol use: No     Alcohol/week: 0.0 standard drinks  . Drug use: No    Home Medications Prior to Admission medications   Medication Sig Start Date End Date Taking? Authorizing Provider  azithromycin (ZITHROMAX) 250 MG tablet Take 2 tablets by mouth on day 1, followed by 1 tablet by mouth daily for 4 days. 08/06/20  Yes Saguier, Percell Miller, PA-C  Dextran 70-Hypromellose, PF, ([employer] NATURAL TEARS) 0.1-0.3 % SOLN Place 1 drop into both eyes at bedtime.    [provider]  fluticasone (FLONASE) 50 MCG/ACT nasal spray Place 2 sprays into the nose daily as needed (seasonal allergies).    [provider]  folic acid (FOLVITE) 1 MG tablet Take 1 mg by mouth every morning.    [provider]  furosemide (LASIX) 20 MG tablet Take 1 tablet (20 mg total) by mouth daily. 05/13/20   [name] R, DO  gemfibrozil (LOPID) 600 MG tablet Take 1 tablet (600 mg total) by mouth daily. 04/14/20   [name] R, DO  Glucosamine HCl (GLUCOSAMINE PO) Take 1 tablet by mouth every morning.    [provider]  levothyroxine (SYNTHROID) 100 MCG tablet Take 1 tablet (100 mcg total) by mouth daily before breakfast. 04/14/20   Carollee Herter, Alferd Apa, DO  losartan-hydrochlorothiazide (HYZAAR) 50-12.5 MG tablet Take 1 tablet by mouth daily. 06/28/20   [name], DO  magic mouthwash (lidocaine, diphenhydrAMINE, alum & mag hydroxide) suspension 5 ml po qid swish and spit 08/06/20   Saguier, Percell Miller, PA-C  methotrexate (RHEUMATREX) 2.5 MG tablet TAKE 6 TABLETS (15 MG TOTAL) BY MOUTH EVERY SATURDAY. 07/17/20   [name], DO  Multiple Vitamin (MULTIVITAMIN WITH MINERALS) TABS tablet Take 1 tablet by mouth every morning.    [provider]  NONFORMULARY OR COMPOUNDED ITEM Compression stocking   20-30 mg / hg   #1   Dx low ext edema 11/13/19   Carollee Herter, Alferd Apa, DO  NONFORMULARY OR COMPOUNDED ITEM rollator walker #1  Dx balance issues, frequent falls 04/13/20   Carollee Herter,  Kendrick Fries R, DO  pantoprazole (PROTONIX) 40 MG tablet TAKE 1 TABLET BY MOUTH EVERY DAY 03/30/20   [name], DO  Vitamin D, Ergocalciferol, (DRISDOL) 50000 units CAPS capsule Take 50,000 Units by mouth every Saturday.  08/25/16   [provider]    Allergies    Asa [aspirin] and Penicillin g  Review of Systems   Review of Systems  Respiratory: Positive for cough.   Gastrointestinal: Positive for abdominal pain.  All other systems reviewed and are negative.   Physical Exam Updated Vital Signs BP (!) 145/68 (BP  Location: Left Arm)   Pulse 90   Temp 100.1 F (37.8 C) (Oral)   Resp (!) 22   Ht 5' (1.524 m)   Wt 61.2 kg   SpO2 90%   BMI 26.35 kg/m   Physical Exam Vitals and nursing note reviewed.  Constitutional:      General: He is not in acute distress.    Appearance: He is well-developed. He is ill-appearing.  HENT:     Head: Normocephalic and atraumatic.     Mouth/Throat:     Comments: Mouth is full of ulcerating lesions on the lips, gums, palate and posterior pharynx.  No significant involvement of the tongue.  Areas are bleeding. Eyes:     Conjunctiva/sclera: Conjunctivae normal.     Pupils: Pupils are equal, round, and reactive to light.  Cardiovascular:     Rate and Rhythm: Normal rate and regular rhythm.     Heart sounds: No murmur heard.   Pulmonary:     Effort: Pulmonary effort is normal. No respiratory distress.     Breath sounds: Examination of the left-lower field reveals decreased breath sounds. Decreased breath sounds present. No wheezing or rales.  Abdominal:     General: There is no distension.     Palpations: Abdomen is soft.     Tenderness: There is no abdominal tenderness. There is no guarding or rebound.  Genitourinary:    Penis: Normal.      Testes: Normal.  Musculoskeletal:        General: No tenderness. Normal range of motion.     Cervical back: Normal range of motion and neck supple.     Right lower leg: No edema.     Left  lower leg: No edema.  Skin:    General: Skin is warm and dry.     Coloration: Skin is pale.     Findings: No erythema or rash.     Comments: Slight darkening of the skin noted on the back of the neck.  No genital rashes or rashes elsewhere  Neurological:     Mental Status: He is alert and oriented to person, place, and time.     Comments: Moving all ext.  Answers questions appropriately.  Generalized weakness.    Psychiatric:     Comments: Calm and cooperative     ED Results / Procedures / Treatments   Labs (all labs ordered are listed, but only abnormal results are displayed) Labs Reviewed  COMPREHENSIVE METABOLIC PANEL - Abnormal; Notable for the following components:      Result Value   Glucose, Bld 148 (*)    BUN 32 (*)    Creatinine, Ser 1.52 (*)    Total Protein 8.4 (*)    GFR, Estimated 43 (*)    All other components within normal limits  CBC - Abnormal; Notable for the following components:   WBC 2.4 (*)    RBC 4.21 (*)    Hemoglobin 12.6 (*)    HCT 37.9 (*)    All other components within normal limits  RESP PANEL BY RT-PCR (FLU A&B, COVID) [id number]  CULTURE, BLOOD (ROUTINE X 2)  CULTURE, BLOOD (ROUTINE X 2)  URINE CULTURE  LIPASE, BLOOD  LACTIC ACID, PLASMA  PROTIME-INR  APTT  URINALYSIS, ROUTINE W REFLEX MICROSCOPIC  LACTIC ACID, PLASMA    EKG EKG Interpretation  Date/Time:  Saturday August 07 2020 15:55:01 EDT Ventricular Rate:  96 PR Interval:    QRS Duration: 97 QT Interval:  368 QTC Calculation: 465  R Axis:   11 Text Interpretation: Sinus rhythm Borderline T abnormalities, inferior leads No significant change since last tracing Confirmed by Blanchie Dessert [phone]) on 08/07/2020 3:57:44 PM   Radiology DG Chest Port 1 View  Result Date: 08/07/2020 CLINICAL DATA:  Abdominal pain and productive cough. EXAM: PORTABLE CHEST 1 VIEW COMPARISON:  Chest radiograph dated 03/09/2019. FINDINGS: The heart is borderline enlarged. Vascular calcifications are  seen in the aortic arch. The lungs are clear. Degenerative changes are seen in the spine. IMPRESSION: No active cardiopulmonary disease. Aortic Atherosclerosis (ICD10-I70.0). Electronically Signed   By: Zerita Boers M.D.   On: 08/07/2020 16:37    Procedures Procedures   Medications Ordered in ED Medications  lactated ringers infusion (has no administration in time range)  lactated ringers bolus 500 mL (500 mLs Intravenous New Bag/Given 08/07/20 1615)    ED Course  I have reviewed the triage vital signs and the nursing notes.  Pertinent labs & imaging results that were available during my care of the patient were reviewed by me and considered in my medical decision making (see chart for details).    MDM Rules/Calculators/A&P                          Elderly male presenting today with severe stomatitis, poor oral intact, fever and cough.  Pt was hypoxic on arrival at 88% and placed on NS O2 with improvement of symptoms.  He was also febrile and started on sepsis protocol.  Pt does admit to taking MTX for the first time in a long time on Sunday before sore throat started.  He does not have significant rash over the skin or genitals.  He does appear dehydrated and with cough concern for PNA.  Also concern for dehydrating and possible electrolyte abnormalities.  Pt given IVF and EKG without acute findings.  X-ray and labs pending.  5:39 PM On reevaluation patient is resting comfortably in the bed.  Chest x-ray within normal limits, CBC with a leukopenia of 2.4 but stable hemoglobin of 12.6.  CMP with creatinine of 1.52 which is not significantly different than baseline and normal sodium potassium.  Lipase within normal limits.  Upon further raise view concern patient's symptoms are related to methotrexate.  On repeat evaluation patient still has the darkened area of skin around the back of his neck but no other significant rashes or sloughing of the skin.  Again no genital involvement but severe  stomatitis.  Patient given Solu-Medrol and reports he is feeling much better after IV fluids and oxygen.  Feel that patient needs admission.  Will discuss with hospitalist about coverage with antibiotic. As at this time there is no clear source. Will cover with rocephin.  6:35 PM Spoke with Suezanne Jacquet pharmacist at [facility] and reported pt would be a candidate for leukovorin and this was relayed to the pharmacist at [facility].  MDM Number of Diagnoses or Management Options   Amount and/or Complexity of Data Reviewed Clinical lab tests: ordered and reviewed Tests in the radiology section of CPT: ordered and reviewed Tests in the medicine section of CPT: ordered and reviewed Decide to obtain previous medical records or to obtain history from someone other than the patient: yes Obtain history from someone other than the patient: yes Review and summarize past medical records: yes Discuss the patient with other providers: yes Independent visualization of images, tracings, or specimens: yes  Risk of Complications, Morbidity, and/or Mortality Presenting problems: high Diagnostic procedures:  high Management options: moderate  Patient Progress Patient progress: stable  CRITICAL CARE Performed by: Kristi Hyer Total critical care time: 30 minutes Critical care time was exclusive of separately billable procedures and treating other patients. Critical care was necessary to treat or prevent imminent or life-threatening deterioration. Critical care was time spent personally by me on the following activities: development of treatment plan with patient and/or surrogate as well as nursing, discussions with consultants, evaluation of patient's response to treatment, examination of patient, obtaining history from patient or surrogate, ordering and performing treatments and interventions, ordering and review of laboratory studies, ordering and review of radiographic studies, pulse oximetry and re-evaluation of  patient's condition.   Final Clinical Impression(s) / ED Diagnoses Final diagnoses:  Stomatitis  Methotrexate adverse reaction, initial encounter  Hypoxia    Rx / DC Orders ED Discharge Orders    None       Blanchie Dessert, MD 08/07/20 Bosie Helper

## 2020-08-07 NOTE — ED Notes (Signed)
Phone Handoff report provided to Vera-RN at [facility] 6th floor

## 2020-08-07 NOTE — ED Triage Notes (Addendum)
Patient reports abdominal pain since last night and coughing up clear phlegm.  Has low grade temp in triage and O2 sat 88%.  Daughter also reports patient was seen at [facility] yesterday and given zithromax for URI.  Took two last night then mistakenly took two again this morning.  She reports he has not had any abdominal pain prior to this.

## 2020-08-07 NOTE — Progress Notes (Signed)
[facility] is following the sepsis protocol. 

## 2020-08-07 NOTE — Sepsis Progress Note (Signed)
Notified provider of need to order antibiotics.   

## 2020-08-07 NOTE — H&P (Signed)
History and Physical    Charlton Boule JJO:[id number] DOB: 27-Nov-1928 DOA: 08/07/2020  PCP: [name], DO   Patient coming from: Home   Chief Complaint: Mouth and throat pain, cough, SOB, abdominal pain   HPI: [name] is a 87 y.o. male with medical history significant for hypertension, chronic kidney disease stage IIIa, colon cancer status post hemicolectomy and chemotherapy, hypothyroidism, memory loss, and rheumatoid arthritis, now presenting to the emergency department for evaluation of mouth and throat pain, cough, and shortness of breath.  Patient was previously treated with methotrexate for his rheumatoid arthritis but had been off of that for several years until resuming on 08/01/2020.  1-2 days later he began to develop sore throat and sore mouth which has progressively worsened to the point where he is unable to eat or drink due to pain.  He has also developed a productive cough and shortness of breath over the past couple days.  He was evaluated by his PCP for the symptoms yesterday and started on azithromycin.  He took 500 mg of azithromycin yesterday, misunderstood the instructions and took another 500 mg today.  Cough has been productive of thin clear sputum.  He had some abdominal discomfort earlier which he was attributing to antibiotic and notes that it has now resolved.  [facility] ED Course: Upon arrival to the ED, patient is found to have a temperature 37.8 C saturating low 90s on 4 L/min of supplemental oxygen, slightly tachypneic, and with stable blood pressure.  EKG features sinus rhythm and chest x-rays negative for acute cardiopulmonary disease.  History panel notable for BUN of 32 and creatinine 1.52.  CBC features a leukopenia with WBC 2400 and a mild normocytic anemia.  Lactic acid is normal and COVID-19 screening test negative.  Cultures collected in the emergency department and the patient was treated with IV fluids and Rocephin.  Review of Systems:   All other systems reviewed and apart from HPI, are negative.  Past Medical History:  Diagnosis Date  . Adenocarcinoma ([place]) 1985   colon s/p right hemicolectomy and chemotherapy. F/U with cancer center and Dr. Lucia Gaskins rotinely  . Depression   . Eczema   . Epistaxis    f/u per ENT  . Erectile dysfunction   . Gastric ulcer 12/11   H-Pylori Tx, EGD 3-12: gastritis  . Hay fever   . Head injury 11/2017   with fall  . Hyperlipemia   . Hypertension   . Hypothyroidism    Hypothyroid  . Insomnia   . Osteopenia     per DEXA 07-2008 (Rx fosamax)  . Rheumatoid arthritis ([place])   . Stroke [address])     " balance issue"    Past Surgical History:  Procedure Laterality Date  . COLON SURGERY     Right / Adenocarcinoma / Chemo  . COLONOSCOPY  2005, 2008   Dr. Lucia Gaskins  . INGUINAL HERNIA REPAIR     Right  . IR ANGIO INTRA EXTRACRAN SEL COM CAROTID INNOMINATE BILAT MOD SED  12/13/2017  . IR ANGIO VERTEBRAL SEL VERTEBRAL BILAT MOD SED  12/13/2017  . IR PTA INTRACRANIAL  12/18/2017  . RADIOLOGY WITH ANESTHESIA N/A 12/18/2017   Procedure: Angioplasty with stenting;  Surgeon: Luanne Bras, MD;  Location: [place];  Service: Radiology;  Laterality: N/A;    Social History:   reports that he has never smoked. He has never used smokeless tobacco. He reports that he does not drink alcohol and does not use  drugs.  Allergies  Allergen Reactions  . Asa [Aspirin] Other (See Comments)    Gastric symptoms can tolerate the baby ASA but no more than that. Pt. Is taking ASA 81mg  now.  . Penicillin G Rash    Did it involve swelling of the face/tongue/throat, SOB, or low BP? Unknown Did it involve sudden or severe rash/hives, skin peeling, or any reaction on the inside of your mouth or nose? Unknown Did you need to seek medical attention at a hospital or doctor's office? Unknown When did it last happen?unknown If all above answers are "NO", may proceed with cephalosporin use.    Family History   Problem Relation Age of Onset  . Diabetes Neg Hx   . Heart attack Neg Hx   . Colon cancer Neg Hx   . Prostate cancer Neg Hx      Prior to Admission medications   Medication Sig Start Date End Date Taking? Authorizing Provider  azithromycin (ZITHROMAX) 250 MG tablet Take 2 tablets by mouth on day 1, followed by 1 tablet by mouth daily for 4 days. 08/06/20  Yes Saguier, Percell Miller, PA-C  Dextran 70-Hypromellose, PF, ([facility] NATURAL TEARS) 0.1-0.3 % SOLN Place 1 drop into both eyes at bedtime.    [provider]  fluticasone (FLONASE) 50 MCG/ACT nasal spray Place 2 sprays into the nose daily as needed (seasonal allergies).    [provider]  folic acid (FOLVITE) 1 MG tablet Take 1 mg by mouth every morning.    [provider]  furosemide (LASIX) 20 MG tablet Take 1 tablet (20 mg total) by mouth daily. 05/13/20   [name] R, DO  gemfibrozil (LOPID) 600 MG tablet Take 1 tablet (600 mg total) by mouth daily. 04/14/20   [name] R, DO  Glucosamine HCl (GLUCOSAMINE PO) Take 1 tablet by mouth every morning.    [provider]  levothyroxine (SYNTHROID) 100 MCG tablet Take 1 tablet (100 mcg total) by mouth daily before breakfast. 04/14/20   Carollee Herter, Alferd Apa, DO  losartan-hydrochlorothiazide (HYZAAR) 50-12.5 MG tablet Take 1 tablet by mouth daily. 06/28/20   [name], DO  magic mouthwash (lidocaine, diphenhydrAMINE, alum & mag hydroxide) suspension 5 ml po qid swish and spit 08/06/20   Saguier, Percell Miller, PA-C  methotrexate (RHEUMATREX) 2.5 MG tablet TAKE 6 TABLETS (15 MG TOTAL) BY MOUTH EVERY SATURDAY. 07/17/20   [name], DO  Multiple Vitamin (MULTIVITAMIN WITH MINERALS) TABS tablet Take 1 tablet by mouth every morning.    [provider]  NONFORMULARY OR COMPOUNDED ITEM Compression stocking   20-30 mg / hg   #1   Dx low ext edema 11/13/19   Carollee Herter, Alferd Apa, DO  NONFORMULARY OR COMPOUNDED ITEM rollator walker #1   Dx balance issues, frequent falls 04/13/20   Carollee Herter, Kendrick Fries R, DO  pantoprazole (PROTONIX) 40 MG tablet TAKE 1 TABLET BY MOUTH EVERY DAY 03/30/20   [name], DO  Vitamin D, Ergocalciferol, (DRISDOL) 50000 units CAPS capsule Take 50,000 Units by mouth every Saturday.  08/25/16   [provider]    Physical Exam: Vitals:   08/07/20 1700 08/07/20 1730 08/07/20 1846 08/07/20 2048  BP: (!) 150/69 136/66 (!) 145/64 (!) 142/65  Pulse: 90 83 77 85  Resp: 18 16 18 16   Temp:    99.7 F (37.6 C)  TempSrc:    Oral  SpO2: 98% 99% 100% 97%  Weight:  Height:        Constitutional: NAD, calm  Eyes: PERTLA, lids and conjunctivae normal ENMT: Red and edematous oral mucosa. Posterior pharynx clear of any exudate or lesions.   Neck: normal, supple, no masses, no thyromegaly Respiratory: no wheezing, no crackles. No accessory muscle use.  Cardiovascular: S1 & S2 heard, regular rate and rhythm. No extremity edema.   Abdomen: No distension, no tenderness, soft. Bowel sounds active.  Musculoskeletal: no clubbing / cyanosis. No joint deformity upper and lower extremities.   Skin: Patches of hyperpigmentation involving neck. Warm, dry, well-perfused. Neurologic: CN 2-12 grossly intact. Sensation intact. Moving all extremities.  Psychiatric: Alert and oriented to person, place, and situation. Pleasant and cooperative.    Labs and Imaging on Admission: I have personally reviewed following labs and imaging studies  CBC: Recent Labs  Lab 08/06/20 1552 08/07/20 1608  WBC 8.2 2.4*  NEUTROABS 5.3  --   HGB 13.2 12.6*  HCT 39.0 37.9*  MCV 90 90.0  PLT 200 315   Basic Metabolic Panel: Recent Labs  Lab 08/06/20 1552 08/07/20 1608  NA 139 136  K 4.1 3.6  CL 100 99  CO2 21 25  GLUCOSE 101* 148*  BUN 28 32*  CREATININE 1.25 1.52*  CALCIUM 9.4 9.6   GFR: Estimated Creatinine Clearance: 24.9 mL/min (A) (by C-G formula based on SCr of 1.52 mg/dL (H)). Liver Function  Tests: Recent Labs  Lab 08/06/20 1552 08/07/20 1608  AST 26 33  ALT 22 24  ALKPHOS 96 67  BILITOT 0.5 0.6  PROT 7.6 8.4*  ALBUMIN 4.5 4.0   Recent Labs  Lab 08/07/20 1608  LIPASE 30   No results for input(s): AMMONIA in the last 168 hours. Coagulation Profile: Recent Labs  Lab 08/07/20 1608  INR 1.1   Cardiac Enzymes: No results for input(s): CKTOTAL, CKMB, CKMBINDEX, TROPONINI in the last 168 hours. BNP (last 3 results) No results for input(s): PROBNP in the last 8760 hours. HbA1C: No results for input(s): HGBA1C in the last 72 hours. CBG: No results for input(s): GLUCAP in the last 168 hours. Lipid Profile: No results for input(s): CHOL, HDL, LDLCALC, TRIG, CHOLHDL, LDLDIRECT in the last 72 hours. Thyroid Function Tests: No results for input(s): TSH, T4TOTAL, FREET4, T3FREE, THYROIDAB in the last 72 hours. Anemia Panel: No results for input(s): VITAMINB12, FOLATE, FERRITIN, TIBC, IRON, RETICCTPCT in the last 72 hours. Urine analysis:    Component Value Date/Time   COLORURINE STRAW (A) 03/09/2019 2042   APPEARANCEUR CLEAR 03/09/2019 2042   LABSPEC 1.012 03/09/2019 2042   PHURINE 6.0 03/09/2019 2042   GLUCOSEU NEGATIVE 03/09/2019 2042   HGBUR NEGATIVE 03/09/2019 2042   HGBUR negative 03/17/2010 1010   BILIRUBINUR NEGATIVE 03/09/2019 2042   KETONESUR NEGATIVE 03/09/2019 2042   PROTEINUR NEGATIVE 03/09/2019 2042   UROBILINOGEN 0.2 03/17/2010 1010   NITRITE NEGATIVE 03/09/2019 2042   LEUKOCYTESUR NEGATIVE 03/09/2019 2042   Sepsis Labs: @LABRCNTIP (procalcitonin:4,lacticidven:4) ) Recent Results (from the past 240 hour(s))  Resp Panel by RT-PCR (Flu A&B, Covid) Peripheral     Status: None   Collection Time: 08/07/20  4:17 PM   Specimen: Peripheral; Nasopharyngeal(NP) swabs in vial transport medium  Result Value Ref Range Status   SARS Coronavirus 2 by RT PCR NEGATIVE NEGATIVE Final    Comment: (NOTE) SARS-CoV-2 target nucleic acids are NOT DETECTED.  The  SARS-CoV-2 RNA is generally detectable in upper respiratory specimens during the acute phase of infection. The lowest concentration of SARS-CoV-2 viral copies this  assay can detect is 138 copies/mL. A negative result does not preclude SARS-Cov-2 infection and should not be used as the sole basis for treatment or other patient management decisions. A negative result may occur with  improper specimen collection/handling, submission of specimen other than nasopharyngeal swab, presence of viral mutation(s) within the areas targeted by this assay, and inadequate number of viral copies(<138 copies/mL). A negative result must be combined with clinical observations, patient history, and epidemiological information. The expected result is Negative.  Fact Sheet for Patients:  EntrepreneurPulse.com.au  Fact Sheet for Healthcare Providers:  IncredibleEmployment.be  This test is no t yet approved or cleared by the [place] FDA and  has been authorized for detection and/or diagnosis of SARS-CoV-2 by FDA under an Emergency Use Authorization (EUA). This EUA will remain  in effect (meaning this test can be used) for the duration of the COVID-19 declaration under Section 564(b)(1) of the Act, 21 U.S.C.section 360bbb-3(b)(1), unless the authorization is terminated  or revoked sooner.       Influenza A by PCR NEGATIVE NEGATIVE Final   Influenza B by PCR NEGATIVE NEGATIVE Final    Comment: (NOTE) The Xpert Xpress SARS-CoV-2/FLU/RSV plus assay is intended as an aid in the diagnosis of influenza from Nasopharyngeal swab specimens and should not be used as a sole basis for treatment. Nasal washings and aspirates are unacceptable for Xpert Xpress SARS-CoV-2/FLU/RSV testing.  Fact Sheet for Patients: EntrepreneurPulse.com.au  Fact Sheet for Healthcare Providers: IncredibleEmployment.be  This test is not yet approved or  cleared by the [place] FDA and has been authorized for detection and/or diagnosis of SARS-CoV-2 by FDA under an Emergency Use Authorization (EUA). This EUA will remain in effect (meaning this test can be used) for the duration of the COVID-19 declaration under Section 564(b)(1) of the Act, 21 U.S.C. section 360bbb-3(b)(1), unless the authorization is terminated or revoked.  Performed at [facility], [address] [place], [place] [address]      Radiological Exams on Admission: DG Chest [facility] 1 View  Result Date: 08/07/2020 CLINICAL DATA:  Abdominal pain and productive cough. EXAM: PORTABLE CHEST 1 VIEW COMPARISON:  Chest radiograph dated 03/09/2019. FINDINGS: The heart is borderline enlarged. Vascular calcifications are seen in the aortic arch. The lungs are clear. Degenerative changes are seen in the spine. IMPRESSION: No active cardiopulmonary disease. Aortic Atherosclerosis (ICD10-I70.0). Electronically Signed   By: Zerita Boers M.D.   On: 08/07/2020 16:37    EKG: Independently reviewed. Sinus rhythm.   Assessment/Plan   1. Stomatitis  - Presents with mouth and throat pain and noted to have stomatitis suspected secondary to methotrexate  - He has not been eating or drinking due to pain, appears dehydrated, and has increased creatinine  - Continue symptom management magic mouthwash and APAP, continue IVF hydration, check methotrexate level and consider leucovorin   2. SIRS  - Mild tachypnea and leukopenia noted in ED with new supplemental O2 requirement  - Leukopenia likely secondary to methotrexate rather than infection and lactate is reassuringly normal  - Blood cultures were collected in ED and Rocephin was administered  - Continue Rocephin and azithromycin for possible PNA    3. Acute hypoxic respiratory failure  - Presents with 2 days cough and worsening SOB, found to have new supplemental O2 requirement  - No acute findings noted on CXR  - Continue to  treat possible PNA with Rocephin and azithromycin, continue supplemental O2 as needed     4. CKD IIIa  -  SCr is 1.52 on admission, up from apparent baseline of ~1.25 in setting of dehydration  - Hold losartan-HCTZ, renally-dose medications, continue IVF hydration until able to tolerate adequate oral hydration, and repeat chem panel in am    5. Hypertension  - BP at goal, hold losartan-HCTZ in light of increased creatinine and treat as-needed only for now     6. Leukopenia  - WBC is 2,400 on admission, likely secondary to methotrexate, less likely infection  - Check differential, trend    7. Rheumatoid arthritis  - Next dose methotrexate due 3/20, will hold    8. Hypothyroidism  - Continue Synthroid    DVT prophylaxis: sq heparin  Code Status: Full  Level of Care: Level of care: Med-Surg Family Communication: Discussed with daughter at bedside  Disposition Plan:  Patient is from: Home  Anticipated d/c is to: TBD Anticipated d/c date is: Possibly as early as 3/20 Patient currently: Pending tolerance of oral intake   Consults called: None   Admission status: Observation    Vianne Bulls, MD [facility]  08/07/2020, 9:06 PM

## 2020-08-07 NOTE — Sepsis Progress Note (Signed)
Secure chat with bedside nurse regarding plan for antibiotics. Bedside nurse d/w provider and is waiting on some more information before ordering antibiotics.

## 2020-08-07 NOTE — ED Notes (Signed)
ED Provider at bedside. 

## 2020-08-07 NOTE — ED Notes (Signed)
Phone Handoff Report given to [facility] Transport Team 

## 2020-08-08 DIAGNOSIS — Z85038 Personal history of other malignant neoplasm of large intestine: Secondary | ICD-10-CM | POA: Diagnosis not present

## 2020-08-08 DIAGNOSIS — Z79899 Other long term (current) drug therapy: Secondary | ICD-10-CM | POA: Diagnosis not present

## 2020-08-08 DIAGNOSIS — Z880 Allergy status to penicillin: Secondary | ICD-10-CM | POA: Diagnosis not present

## 2020-08-08 DIAGNOSIS — R413 Other amnesia: Secondary | ICD-10-CM | POA: Diagnosis present

## 2020-08-08 DIAGNOSIS — J302 Other seasonal allergic rhinitis: Secondary | ICD-10-CM | POA: Diagnosis present

## 2020-08-08 DIAGNOSIS — N179 Acute kidney failure, unspecified: Secondary | ICD-10-CM | POA: Diagnosis present

## 2020-08-08 DIAGNOSIS — M069 Rheumatoid arthritis, unspecified: Secondary | ICD-10-CM | POA: Diagnosis present

## 2020-08-08 DIAGNOSIS — N400 Enlarged prostate without lower urinary tract symptoms: Secondary | ICD-10-CM | POA: Diagnosis present

## 2020-08-08 DIAGNOSIS — D61811 Other drug-induced pancytopenia: Secondary | ICD-10-CM | POA: Diagnosis present

## 2020-08-08 DIAGNOSIS — Z888 Allergy status to other drugs, medicaments and biological substances status: Secondary | ICD-10-CM | POA: Diagnosis not present

## 2020-08-08 DIAGNOSIS — R6510 Systemic inflammatory response syndrome (SIRS) of non-infectious origin without acute organ dysfunction: Secondary | ICD-10-CM | POA: Diagnosis present

## 2020-08-08 DIAGNOSIS — J9601 Acute respiratory failure with hypoxia: Secondary | ICD-10-CM | POA: Diagnosis present

## 2020-08-08 DIAGNOSIS — G4700 Insomnia, unspecified: Secondary | ICD-10-CM | POA: Diagnosis present

## 2020-08-08 DIAGNOSIS — K121 Other forms of stomatitis: Secondary | ICD-10-CM | POA: Diagnosis present

## 2020-08-08 DIAGNOSIS — I129 Hypertensive chronic kidney disease with stage 1 through stage 4 chronic kidney disease, or unspecified chronic kidney disease: Secondary | ICD-10-CM | POA: Diagnosis present

## 2020-08-08 DIAGNOSIS — E785 Hyperlipidemia, unspecified: Secondary | ICD-10-CM | POA: Diagnosis present

## 2020-08-08 DIAGNOSIS — Z9049 Acquired absence of other specified parts of digestive tract: Secondary | ICD-10-CM | POA: Diagnosis not present

## 2020-08-08 DIAGNOSIS — E039 Hypothyroidism, unspecified: Secondary | ICD-10-CM | POA: Diagnosis present

## 2020-08-08 DIAGNOSIS — N1831 Chronic kidney disease, stage 3a: Secondary | ICD-10-CM | POA: Diagnosis present

## 2020-08-08 DIAGNOSIS — Z20822 Contact with and (suspected) exposure to covid-19: Secondary | ICD-10-CM | POA: Diagnosis present

## 2020-08-08 DIAGNOSIS — M8580 Other specified disorders of bone density and structure, unspecified site: Secondary | ICD-10-CM | POA: Diagnosis present

## 2020-08-08 DIAGNOSIS — Z886 Allergy status to analgesic agent status: Secondary | ICD-10-CM | POA: Diagnosis not present

## 2020-08-08 DIAGNOSIS — D702 Other drug-induced agranulocytosis: Secondary | ICD-10-CM

## 2020-08-08 DIAGNOSIS — Z9221 Personal history of antineoplastic chemotherapy: Secondary | ICD-10-CM | POA: Diagnosis not present

## 2020-08-08 DIAGNOSIS — I10 Essential (primary) hypertension: Secondary | ICD-10-CM | POA: Diagnosis not present

## 2020-08-08 DIAGNOSIS — E876 Hypokalemia: Secondary | ICD-10-CM | POA: Diagnosis present

## 2020-08-08 LAB — URINALYSIS, ROUTINE W REFLEX MICROSCOPIC
Bilirubin Urine: NEGATIVE
Glucose, UA: NEGATIVE mg/dL
Hgb urine dipstick: NEGATIVE
Ketones, ur: 5 mg/dL — AB
Leukocytes,Ua: NEGATIVE
Nitrite: NEGATIVE
Protein, ur: NEGATIVE mg/dL
Specific Gravity, Urine: 1.02 (ref 1.005–1.030)
pH: 5 (ref 5.0–8.0)

## 2020-08-08 LAB — CBC WITH DIFFERENTIAL/PLATELET
Abs Immature Granulocytes: 0.08 10*3/uL — ABNORMAL HIGH (ref 0.00–0.07)
Basophils Absolute: 0.1 10*3/uL (ref 0.0–0.1)
Basophils Relative: 1 %
Eosinophils Absolute: 0 10*3/uL (ref 0.0–0.5)
Eosinophils Relative: 0 %
HCT: 31.8 % — ABNORMAL LOW (ref 39.0–52.0)
Hemoglobin: 10.6 g/dL — ABNORMAL LOW (ref 13.0–17.0)
Immature Granulocytes: 2 %
Lymphocytes Relative: 17 %
Lymphs Abs: 0.6 10*3/uL — ABNORMAL LOW (ref 0.7–4.0)
MCH: 30.3 pg (ref 26.0–34.0)
MCHC: 33.3 g/dL (ref 30.0–36.0)
MCV: 90.9 fL (ref 80.0–100.0)
Monocytes Absolute: 0 10*3/uL — ABNORMAL LOW (ref 0.1–1.0)
Monocytes Relative: 1 %
Neutro Abs: 3 10*3/uL (ref 1.7–7.7)
Neutrophils Relative %: 79 %
Platelets: 125 10*3/uL — ABNORMAL LOW (ref 150–400)
RBC: 3.5 MIL/uL — ABNORMAL LOW (ref 4.22–5.81)
RDW: 11.9 % (ref 11.5–15.5)
WBC: 3.8 10*3/uL — ABNORMAL LOW (ref 4.0–10.5)
nRBC: 0 % (ref 0.0–0.2)

## 2020-08-08 LAB — BASIC METABOLIC PANEL
Anion gap: 11 (ref 5–15)
BUN: 31 mg/dL — ABNORMAL HIGH (ref 8–23)
CO2: 22 mmol/L (ref 22–32)
Calcium: 8.6 mg/dL — ABNORMAL LOW (ref 8.9–10.3)
Chloride: 105 mmol/L (ref 98–111)
Creatinine, Ser: 1.34 mg/dL — ABNORMAL HIGH (ref 0.61–1.24)
GFR, Estimated: 50 mL/min — ABNORMAL LOW (ref >60)
Glucose, Bld: 144 mg/dL — ABNORMAL HIGH (ref 70–99)
Potassium: 3.7 mmol/L (ref 3.5–5.1)
Sodium: 138 mmol/L (ref 135–145)

## 2020-08-08 LAB — CULTURE, GROUP A STREP
MICRO NUMBER:: 11665089
SPECIMEN QUALITY:: ADEQUATE

## 2020-08-08 MED ORDER — MAGIC MOUTHWASH W/LIDOCAINE
5.0000 mL | Freq: Four times a day (QID) | ORAL | Status: DC
  Administered 2020-08-08 – 2020-08-13 (×18): 5 mL via ORAL
  Filled 2020-08-08 (×22): qty 5

## 2020-08-08 MED ORDER — NYSTATIN 100000 UNIT/ML MT SUSP
5.0000 mL | Freq: Four times a day (QID) | OROMUCOSAL | Status: DC
  Administered 2020-08-08 – 2020-08-13 (×7): 500000 [IU] via ORAL
  Filled 2020-08-08 (×9): qty 5

## 2020-08-08 MED ORDER — LEVOTHYROXINE SODIUM 100 MCG/5ML IV SOLN
50.0000 ug | Freq: Every day | INTRAVENOUS | Status: DC
  Administered 2020-08-11: 50 ug via INTRAVENOUS
  Filled 2020-08-08 (×2): qty 5

## 2020-08-08 MED ORDER — FOLIC ACID 1 MG PO TABS: 2.0000 mg | ORAL_TABLET | Freq: Every morning | ORAL | Status: DC

## 2020-08-08 MED ORDER — POLYVINYL ALCOHOL 1.4 % OP SOLN
1.0000 [drp] | OPHTHALMIC | Status: DC | PRN
  Administered 2020-08-08: 1 [drp] via OPHTHALMIC
  Filled 2020-08-08: qty 15

## 2020-08-08 MED ORDER — SODIUM CHLORIDE 0.9 % IV SOLN: INTRAVENOUS | Status: AC

## 2020-08-08 MED ORDER — LIP MEDEX EX OINT
TOPICAL_OINTMENT | CUTANEOUS | Status: AC
  Administered 2020-08-08: 1
  Filled 2020-08-08: qty 7

## 2020-08-08 NOTE — Progress Notes (Signed)
PROGRESS NOTE  [name] ZGY:[id number] DOB: 23-May-1928 DOA: 08/07/2020 PCP: [name], DO  HPI/Recap of past 24 hours: HPI from Dr [name] is a 87 y.o. male with medical history significant for hypertension, CKD stage IIIa, colon cancer status post hemicolectomy and chemotherapy, hypothyroidism, memory loss, and rheumatoid arthritis, now presenting to the emergency department for evaluation of mouth and throat pain, cough, and shortness of breath.  Patient was previously treated with methotrexate for his rheumatoid arthritis (noted mild reaction), but had been off of that for several years until resuming on 08/01/2020.  1-2 days later he began to develop sore throat and sore mouth which has progressively worsened to the point where he is unable to eat or drink due to pain.  He has also developed a productive cough and shortness of breath over the past couple days.  He was evaluated by his PCP for the symptoms and started on azithromycin. In the ED, pt found to have a temperature 37.8 C, saturating low 90s on 4 L/min of O2, slightly tachypneic, and with stable blood pressure.  EKG features sinus rhythm and chest x-rays negative for acute cardiopulmonary disease. Lactic acid is normal and COVID-19 screening test negative.  Cultures collected in the emergency department and the patient was treated with IV fluids and Rocephin.  Patient admitted for further management.     Today, met patient's daughter at bedside who assisted with translation.  Daughter stated that patient still has mouth and throat pain, but denies its worsening.  Patient was able to tolerate sips of clear liquid diet.  Denies any worsening shortness of breath or worsening cough.  In addition to the history above, patient has also been feeling dizzy and weak due to poor oral intake and recently had a fall about 3 days ago prior to presentation.  Denies any chest pain, abdominal pain, nausea/vomiting,  fever/chills.   Assessment/Plan: Principal Problem:   Ulcerative stomatitis Active Problems:   Essential hypertension   Rheumatoid arthritis (HCC)   Chronic kidney disease, stage 3a (HCC)   Leukopenia   Acute respiratory failure with hypoxia (HCC)   SIRS (systemic inflammatory response syndrome) (HCC)   Methotrexate adverse reaction, initial encounter    Stomatitis likely 2/2 methotrexate Noted mouth and throat pain with oral lesions Symptomatic management with Magic mouthwash, pain management, continue folic acid If no much improvement, may try leucovorin IV fluids still oral intake adequate Advance diet as tolerated Monitor closely  Acute hypoxic respiratory failure Noted cough, worsening SOB, requiring about 3 L of O2 to maintain sats Chest x-ray unremarkable Possibly ?PNA, ?  Aspiration given above Continue Rocephin, azithromycin Continue supplemental O2 as needed  SIRS Noted tachypnea, leukopenia (possibly related to methotrexate) UA pending, BC x2 pending Continue Rocephin, azithromycin for possible pneumonia  Leukopenia Likely 2/2 methotrexate Daily CBC  Possible oral thrush Start nystatin  AKI on CKD stage IIIa Creatinine on admission 1.52, baseline around 1.2 Likely in the setting of poor oral intake Continue to hold losartan/hydrochlorothiazide Continue IV fluid until able to tolerate adequately  Anemia of chronic kidney disease Baseline hemoglobin between 10-11 Daily CBC  Hypertension BP stable Continue to hold losartan/HCTZ due to mild AKI  Hypothyroidism Switch oral Synthroid to IV as patient refusing to take  Rheumatoid arthritis Discontinue methotrexate Follow-up with PCP, rheumatologist (follows in the [facility] system)     Estimated body mass index is 23.29 kg/m as calculated from the following:   Height as of this encounter: 5' (  1.524 m).   Weight as of this encounter: 54.1 kg.     Code Status: Full  Family Communication:  Discussed with daughter at bedside  Disposition Plan: Status is: Observation  The patient will require care spanning > 2 midnights and should be moved to inpatient because: IV treatments appropriate due to intensity of illness or inability to take PO  Dispo: The patient is from: Home              Anticipated d/c is to: Home              Patient currently is not medically stable to d/c.   Difficult to place patient No    Consultants:  None  Procedures:  None  Antimicrobials:  Rocephin  Azithromycin  DVT prophylaxis: Heparin [place]   Objective: Vitals:   08/07/20 2048 08/08/20 0035 08/08/20 0447 08/08/20 0839  BP: (!) 142/65 128/64 127/68 (!) 114/54  Pulse: 85 79 86 84  Resp: _0 Temp: 99.7 F (37.6 C) 99 F (37.2 C) 98.8 F (37.1 C) 99.1 F (37.3 C)  TempSrc: Oral Oral Oral Oral  SpO2: 97% 97% 98% 96%  Weight: 54.1 kg     Height:        Intake/Output Summary (Last 24 hours) at 08/08/2020 1127 Last data filed at 08/08/2020 0956 Gross per 24 hour  Intake 1120.13 ml  Output 175 ml  Net 945.13 ml   Filed Weights   08/07/20 1541 08/07/20 2048  Weight: 61.2 kg 54.1 kg    Exam:  General: NAD, stomatitis noted in oral cavity  Cardiovascular: S1, S2 present  Respiratory: CTAB  Abdomen: Soft, nontender, nondistended, bowel sounds present  Musculoskeletal: No bilateral pedal edema noted  Skin: Normal  Psychiatry: Normal mood   Data Reviewed: CBC: Recent Labs  Lab 08/06/20 1552 08/07/20 1608 08/08/20 0535  WBC 8.2 2.4* 3.8*  NEUTROABS 5.3  --  3.0  HGB 13.2 12.6* 10.6*  HCT 39.0 37.9* 31.8*  MCV 90 90.0 90.9  PLT 200 164 263*   Basic Metabolic Panel: Recent Labs  Lab 08/06/20 1552 08/07/20 1608 08/08/20 0535  NA 139 136 138  K 4.1 3.6 3.7  CL 100 99 105  CO2 _1 GLUCOSE 101* 148* 144*  BUN 28 32* 31*  CREATININE 1.25 1.52* 1.34*  CALCIUM 9.4 9.6 8.6*   GFR: Estimated Creatinine Clearance: 25.9 mL/min (A) (by C-G  formula based on SCr of 1.34 mg/dL (H)). Liver Function Tests: Recent Labs  Lab 08/06/20 1552 08/07/20 1608  AST 26 33  ALT 22 24  ALKPHOS 96 67  BILITOT 0.5 0.6  PROT 7.6 8.4*  ALBUMIN 4.5 4.0   Recent Labs  Lab 08/07/20 1608  LIPASE 30   No results for input(s): AMMONIA in the last 168 hours. Coagulation Profile: Recent Labs  Lab 08/07/20 1608  INR 1.1   Cardiac Enzymes: No results for input(s): CKTOTAL, CKMB, CKMBINDEX, TROPONINI in the last 168 hours. BNP (last 3 results) No results for input(s): PROBNP in the last 8760 hours. HbA1C: No results for input(s): HGBA1C in the last 72 hours. CBG: No results for input(s): GLUCAP in the last 168 hours. Lipid Profile: No results for input(s): CHOL, HDL, LDLCALC, TRIG, CHOLHDL, LDLDIRECT in the last 72 hours. Thyroid Function Tests: No results for input(s): TSH, T4TOTAL, FREET4, T3FREE, THYROIDAB in the last 72 hours. Anemia Panel: No results for input(s): VITAMINB12, FOLATE, FERRITIN, TIBC, IRON, RETICCTPCT in the last 72 hours.  Urine analysis:    Component Value Date/Time   COLORURINE STRAW (A) 03/09/2019 2042   APPEARANCEUR CLEAR 03/09/2019 2042   LABSPEC 1.012 03/09/2019 2042   PHURINE 6.0 03/09/2019 2042   GLUCOSEU NEGATIVE 03/09/2019 2042   HGBUR NEGATIVE 03/09/2019 2042   HGBUR negative 03/17/2010 1010   BILIRUBINUR NEGATIVE 03/09/2019 2042   KETONESUR NEGATIVE 03/09/2019 2042   PROTEINUR NEGATIVE 03/09/2019 2042   UROBILINOGEN 0.2 03/17/2010 1010   NITRITE NEGATIVE 03/09/2019 2042   LEUKOCYTESUR NEGATIVE 03/09/2019 2042   Sepsis Labs: _0 (procalcitonin:4,lacticidven:4)  ) Recent Results (from the past 240 hour(s))  Culture, Group A Strep     Status: None   Collection Time: 08/06/20  3:27 PM   Specimen: Throat  Result Value Ref Range Status   MICRO NUMBER: [id number]  Final   SPECIMEN QUALITY: Adequate  Final   SOURCE: THROAT  Final   STATUS: FINAL  Final   RESULT: No group A Streptococcus  isolated  Final  Resp Panel by RT-PCR (Flu A&B, Covid) Peripheral     Status: None   Collection Time: 08/07/20  4:17 PM   Specimen: Peripheral; Nasopharyngeal(NP) swabs in vial transport medium  Result Value Ref Range Status   SARS Coronavirus 2 by RT PCR NEGATIVE NEGATIVE Final    Comment: (NOTE) SARS-CoV-2 target nucleic acids are NOT DETECTED.  The SARS-CoV-2 RNA is generally detectable in upper respiratory specimens during the acute phase of infection. The lowest concentration of SARS-CoV-2 viral copies this assay can detect is 138 copies/mL. A negative result does not preclude SARS-Cov-2 infection and should not be used as the sole basis for treatment or other patient management decisions. A negative result may occur with  improper specimen collection/handling, submission of specimen other than nasopharyngeal swab, presence of viral mutation(s) within the areas targeted by this assay, and inadequate number of viral copies(<138 copies/mL). A negative result must be combined with clinical observations, patient history, and epidemiological information. The expected result is Negative.  Fact Sheet for Patients:  EntrepreneurPulse.com.au  Fact Sheet for Healthcare Providers:  IncredibleEmployment.be  This test is no t yet approved or cleared by the [place] FDA and  has been authorized for detection and/or diagnosis of SARS-CoV-2 by FDA under an Emergency Use Authorization (EUA). This EUA will remain  in effect (meaning this test can be used) for the duration of the COVID-19 declaration under Section 564(b)(1) of the Act, 21 U.S.C.section 360bbb-3(b)(1), unless the authorization is terminated  or revoked sooner.       Influenza A by PCR NEGATIVE NEGATIVE Final   Influenza B by PCR NEGATIVE NEGATIVE Final    Comment: (NOTE) The Xpert Xpress SARS-CoV-2/FLU/RSV plus assay is intended as an aid in the diagnosis of influenza from  Nasopharyngeal swab specimens and should not be used as a sole basis for treatment. Nasal washings and aspirates are unacceptable for Xpert Xpress SARS-CoV-2/FLU/RSV testing.  Fact Sheet for Patients: EntrepreneurPulse.com.au  Fact Sheet for Healthcare Providers: IncredibleEmployment.be  This test is not yet approved or cleared by the [place] FDA and has been authorized for detection and/or diagnosis of SARS-CoV-2 by FDA under an Emergency Use Authorization (EUA). This EUA will remain in effect (meaning this test can be used) for the duration of the COVID-19 declaration under Section 564(b)(1) of the Act, 21 U.S.C. section 360bbb-3(b)(1), unless the authorization is terminated or revoked.  Performed at [facility], [address], [place], [place] [address]       Studies: DG Chest  Port 1 View  Result Date: 08/07/2020 CLINICAL DATA:  Abdominal pain and productive cough. EXAM: PORTABLE CHEST 1 VIEW COMPARISON:  Chest radiograph dated 03/09/2019. FINDINGS: The heart is borderline enlarged. Vascular calcifications are seen in the aortic arch. The lungs are clear. Degenerative changes are seen in the spine. IMPRESSION: No active cardiopulmonary disease. Aortic Atherosclerosis (ICD10-I70.0). Electronically Signed   By: Zerita Boers M.D.   On: 08/07/2020 16:37    Scheduled Meds: . folic acid  1 mg Oral q morning  . heparin  5,000 Units Subcutaneous Q8H    Continuous Infusions: . azithromycin 500 mg (08/08/20 0955)  . cefTRIAXone (ROCEPHIN)  IV       LOS: 0 days     Alma Friendly, MD [facility]  If 7PM-7AM, please contact night-coverage [email or web address] 08/08/2020, 11:27 AM

## 2020-08-09 ENCOUNTER — Encounter (HOSPITAL_COMMUNITY): Payer: Self-pay | Admitting: Internal Medicine

## 2020-08-09 ENCOUNTER — Inpatient Hospital Stay (HOSPITAL_COMMUNITY): Payer: Medicare Other

## 2020-08-09 DIAGNOSIS — N1831 Chronic kidney disease, stage 3a: Secondary | ICD-10-CM | POA: Diagnosis not present

## 2020-08-09 DIAGNOSIS — D61818 Other pancytopenia: Secondary | ICD-10-CM

## 2020-08-09 DIAGNOSIS — J9601 Acute respiratory failure with hypoxia: Secondary | ICD-10-CM | POA: Diagnosis not present

## 2020-08-09 DIAGNOSIS — K121 Other forms of stomatitis: Secondary | ICD-10-CM | POA: Diagnosis not present

## 2020-08-09 DIAGNOSIS — I10 Essential (primary) hypertension: Secondary | ICD-10-CM | POA: Diagnosis not present

## 2020-08-09 LAB — CBC WITH DIFFERENTIAL/PLATELET
Abs Immature Granulocytes: 0.07 10*3/uL (ref 0.00–0.07)
Basophils Absolute: 0 10*3/uL (ref 0.0–0.1)
Basophils Relative: 0 %
Eosinophils Absolute: 0.1 10*3/uL (ref 0.0–0.5)
Eosinophils Relative: 2 %
HCT: 29.6 % — ABNORMAL LOW (ref 39.0–52.0)
Hemoglobin: 9.7 g/dL — ABNORMAL LOW (ref 13.0–17.0)
Immature Granulocytes: 2 %
Lymphocytes Relative: 29 %
Lymphs Abs: 0.9 10*3/uL (ref 0.7–4.0)
MCH: 30.2 pg (ref 26.0–34.0)
MCHC: 32.8 g/dL (ref 30.0–36.0)
MCV: 92.2 fL (ref 80.0–100.0)
Monocytes Absolute: 0.1 10*3/uL (ref 0.1–1.0)
Monocytes Relative: 2 %
Neutro Abs: 2.1 10*3/uL (ref 1.7–7.7)
Neutrophils Relative %: 65 %
Platelets: 93 10*3/uL — ABNORMAL LOW (ref 150–400)
RBC: 3.21 MIL/uL — ABNORMAL LOW (ref 4.22–5.81)
RDW: 12.1 % (ref 11.5–15.5)
WBC: 3.3 10*3/uL — ABNORMAL LOW (ref 4.0–10.5)
nRBC: 0 % (ref 0.0–0.2)

## 2020-08-09 LAB — BASIC METABOLIC PANEL
Anion gap: 7 (ref 5–15)
BUN: 28 mg/dL — ABNORMAL HIGH (ref 8–23)
CO2: 23 mmol/L (ref 22–32)
Calcium: 8.1 mg/dL — ABNORMAL LOW (ref 8.9–10.3)
Chloride: 110 mmol/L (ref 98–111)
Creatinine, Ser: 1.29 mg/dL — ABNORMAL HIGH (ref 0.61–1.24)
GFR, Estimated: 53 mL/min — ABNORMAL LOW (ref >60)
Glucose, Bld: 99 mg/dL (ref 70–99)
Potassium: 3.6 mmol/L (ref 3.5–5.1)
Sodium: 140 mmol/L (ref 135–145)

## 2020-08-09 LAB — GLUCOSE, CAPILLARY
Glucose-Capillary: 83 mg/dL (ref 70–99)
Glucose-Capillary: 81 mg/dL (ref 70–99)

## 2020-08-09 LAB — METHOTREXATE: Methotrexate: <0.04

## 2020-08-09 MED ORDER — SODIUM CHLORIDE 0.9 % IV SOLN: INTRAVENOUS | Status: AC

## 2020-08-09 MED ORDER — MORPHINE SULFATE (PF) 2 MG/ML IV SOLN
1.0000 mg | INTRAVENOUS | Status: DC | PRN
  Administered 2020-08-09 – 2020-08-10 (×2): 1 mg via INTRAVENOUS
  Filled 2020-08-09 (×2): qty 1

## 2020-08-09 MED ORDER — FOLIC ACID 5 MG/ML IJ SOLN
4.0000 mg | Freq: Every day | INTRAMUSCULAR | Status: DC
  Filled 2020-08-09: qty 0.8

## 2020-08-09 MED ORDER — SODIUM CHLORIDE 0.9 % IV SOLN
4.0000 mg | Freq: Once | INTRAVENOUS | Status: AC
  Administered 2020-08-09: 4 mg via INTRAVENOUS
  Filled 2020-08-09: qty 0.8

## 2020-08-09 MED ORDER — SODIUM CHLORIDE 0.9 % IV SOLN
4.0000 mg | Freq: Every day | INTRAVENOUS | Status: DC
  Administered 2020-08-10 – 2020-08-13 (×4): 4 mg via INTRAVENOUS
  Filled 2020-08-09 (×4): qty 0.8

## 2020-08-09 MED ORDER — IOHEXOL 300 MG/ML  SOLN
75.0000 mL | Freq: Once | INTRAMUSCULAR | Status: AC | PRN
  Administered 2020-08-09: 75 mL via INTRAVENOUS

## 2020-08-09 NOTE — Progress Notes (Signed)
PROGRESS NOTE  [name] IWP:[id number] DOB: July 09, 1928 DOA: 08/07/2020 PCP: [name], DO  HPI/Recap of past 24 hours: HPI from Dr [name] is a 87 y.o. male with medical history significant for hypertension, CKD stage IIIa, colon cancer status post hemicolectomy and chemotherapy, hypothyroidism, memory loss, and rheumatoid arthritis, now presenting to the emergency department for evaluation of mouth and throat pain, cough, and shortness of breath.  Patient was previously treated with methotrexate for his rheumatoid arthritis (noted mild reaction), but had been off of that for several years until resuming on 08/01/2020.  1-2 days later he began to develop sore throat and sore mouth which has progressively worsened to the point where he is unable to eat or drink due to pain.  He has also developed a productive cough and shortness of breath over the past couple days.  He was evaluated by his PCP for the symptoms and started on azithromycin. In the ED, pt found to have a temperature 37.8 C, saturating low 90s on 4 L/min of O2, slightly tachypneic, and with stable blood pressure.  EKG features sinus rhythm and chest x-rays negative for acute cardiopulmonary disease. Lactic acid is normal and COVID-19 screening test negative.  Cultures collected in the emergency department and the patient was treated with IV fluids and Rocephin.  Patient admitted for further management.     Today, patient reported worse odynophagia, with worsening stomatitis, requiring more suction as unable to swallow due to pain.  Denies any chest pain, worsening shortness of breath, abdominal pain, nausea/vomiting, fever/chills.  Discussed with daughter at bedside   Assessment/Plan: Principal Problem:   Ulcerative stomatitis Active Problems:   Essential hypertension   Rheumatoid arthritis (HCC)   Chronic kidney disease, stage 3a (HCC)   Leukopenia   Acute respiratory failure with hypoxia (HCC)   SIRS  (systemic inflammatory response syndrome) (HCC)   Methotrexate adverse reaction, initial encounter    Stomatitis likely 2/2 methotrexate Noted mouth and throat pain with oral lesions Symptomatic management with Magic mouthwash, pain management Continue high-dose IV folic acid If no much improvement, may try leucovorin IV fluids N.p.o. for now Monitor closely  Acute hypoxic respiratory failure Saturating well on 3 L of oxygen, plan to wean off Chest x-ray unremarkable CT neck soft tissue, unremarkable for any edema/swelling Possibly ?PNA, ?  Aspiration given above Continue Rocephin, azithromycin Continue supplemental O2 as needed  SIRS Noted tachypnea, leukopenia (possibly related to methotrexate) UA negative, BC x2 NGTD Continue Rocephin, azithromycin for possible pneumonia  Pancytopenia Likely 2/2 ??methotrexate Discussed with Dr. Alvy Bimler on 08/09/2020, agree that pancytopenia and stomatitis are likely 2/2 methotrexate.  Agree with present management Daily CBC  Possible oral thrush Start nystatin prn  AKI on CKD stage IIIa Creatinine on admission 1.52, baseline around 1.2 Likely in the setting of poor oral intake Continue to hold losartan/hydrochlorothiazide Continue IV fluid until able to tolerate adequately  Anemia of chronic kidney disease Baseline hemoglobin between 10-11 Daily CBC  Hypertension BP stable Continue to hold losartan/HCTZ due to mild AKI  Hypothyroidism Switch oral Synthroid to IV  Rheumatoid arthritis Discontinue methotrexate Follow-up with PCP, rheumatologist (follows in the [facility] system)     Estimated body mass index is 24.41 kg/m as calculated from the following:   Height as of this encounter: 5' (1.524 m).   Weight as of this encounter: 56.7 kg.     Code Status: Full  Family Communication: Discussed with daughter at bedside  Disposition Plan: Status is:  Inpatient  The patient will require care spanning > 2 midnights and  should be moved to inpatient because: IV treatments appropriate due to intensity of illness or inability to take PO  Dispo: The patient is from: Home              Anticipated d/c is to: Home              Patient currently is not medically stable to d/c.   Difficult to place patient No    Consultants:  None  Procedures:  None  Antimicrobials:  Rocephin  Azithromycin  DVT prophylaxis: Hold Heparin Ribera due to thrombocytopenia, mild oozing from oral cavity   Objective: Vitals:   08/08/20 0839 08/08/20 2159 08/09/20 0549 08/09/20 0709  BP: (!) 114/54 130/60 125/60   Pulse: 84 80 77   Resp: 16 16 14    Temp: 99.1 F (37.3 C) 97.8 F (36.6 C) 99.5 F (37.5 C)   TempSrc: Oral Oral Oral   SpO2: 96% 98% 99%   Weight:    56.7 kg  Height:        Intake/Output Summary (Last 24 hours) at 08/09/2020 1450 Last data filed at 08/09/2020 0553 Gross per 24 hour  Intake 718 ml  Output 800 ml  Net -82 ml   Filed Weights   08/07/20 1541 08/07/20 2048 08/09/20 0709  Weight: 61.2 kg 54.1 kg 56.7 kg    Exam:  General: NAD, stomatitis noted in oral cavity with mild oozing  Cardiovascular: S1, S2 present  Respiratory: CTAB  Abdomen: Soft, nontender, nondistended, bowel sounds present  Musculoskeletal: No bilateral pedal edema noted  Skin: Normal  Psychiatry: Normal mood   Data Reviewed: CBC: Recent Labs  Lab 08/06/20 1552 08/07/20 1608 08/08/20 0535 08/09/20 0726  WBC 8.2 2.4* 3.8* 3.3*  NEUTROABS 5.3  --  3.0 2.1  HGB 13.2 12.6* 10.6* 9.7*  HCT 39.0 37.9* 31.8* 29.6*  MCV 90 90.0 90.9 92.2  PLT 200 164 125* 93*   Basic Metabolic Panel: Recent Labs  Lab 08/06/20 1552 08/07/20 1608 08/08/20 0535 08/09/20 0726  NA 139 136 138 140  K 4.1 3.6 3.7 3.6  CL 100 99 105 110  CO2 21 25 22 23   GLUCOSE 101* 148* 144* 99  BUN 28 32* 31* 28*  CREATININE 1.25 1.52* 1.34* 1.29*  CALCIUM 9.4 9.6 8.6* 8.1*   GFR: Estimated Creatinine Clearance: 26.9 mL/min (A)  (by C-G formula based on SCr of 1.29 mg/dL (H)). Liver Function Tests: Recent Labs  Lab 08/06/20 1552 08/07/20 1608  AST 26 33  ALT 22 24  ALKPHOS 96 67  BILITOT 0.5 0.6  PROT 7.6 8.4*  ALBUMIN 4.5 4.0   Recent Labs  Lab 08/07/20 1608  LIPASE 30   No results for input(s): AMMONIA in the last 168 hours. Coagulation Profile: Recent Labs  Lab 08/07/20 1608  INR 1.1   Cardiac Enzymes: No results for input(s): CKTOTAL, CKMB, CKMBINDEX, TROPONINI in the last 168 hours. BNP (last 3 results) No results for input(s): PROBNP in the last 8760 hours. HbA1C: No results for input(s): HGBA1C in the last 72 hours. CBG: No results for input(s): GLUCAP in the last 168 hours. Lipid Profile: No results for input(s): CHOL, HDL, LDLCALC, TRIG, CHOLHDL, LDLDIRECT in the last 72 hours. Thyroid Function Tests: No results for input(s): TSH, T4TOTAL, FREET4, T3FREE, THYROIDAB in the last 72 hours. Anemia Panel: No results for input(s): VITAMINB12, FOLATE, FERRITIN, TIBC, IRON, RETICCTPCT in the last 72 hours. Urine  analysis:    Component Value Date/Time   COLORURINE YELLOW 08/08/2020 1747   APPEARANCEUR CLEAR 08/08/2020 1747   LABSPEC 1.020 08/08/2020 1747   PHURINE 5.0 08/08/2020 1747   GLUCOSEU NEGATIVE 08/08/2020 1747   HGBUR NEGATIVE 08/08/2020 1747   HGBUR negative 03/17/2010 1010   BILIRUBINUR NEGATIVE 08/08/2020 1747   KETONESUR 5 (A) 08/08/2020 1747   PROTEINUR NEGATIVE 08/08/2020 1747   UROBILINOGEN 0.2 03/17/2010 1010   NITRITE NEGATIVE 08/08/2020 1747   LEUKOCYTESUR NEGATIVE 08/08/2020 1747   Sepsis Labs: @LABRCNTIP (procalcitonin:4,lacticidven:4)  ) Recent Results (from the past 240 hour(s))  Culture, Group A Strep     Status: None   Collection Time: 08/06/20  3:27 PM   Specimen: Throat  Result Value Ref Range Status   MICRO NUMBER: [id number]  Final   SPECIMEN QUALITY: Adequate  Final   SOURCE: THROAT  Final   STATUS: FINAL  Final   RESULT: No group A Streptococcus  isolated  Final  Blood Culture (routine x 2)     Status: None (Preliminary result)   Collection Time: 08/07/20  4:00 PM   Specimen: BLOOD RIGHT ARM  Result Value Ref Range Status   Specimen Description   Final    BLOOD RIGHT ARM BLOOD Performed at [facility], [address] [place], [place] [address]    Special Requests   Final    Blood Culture adequate volume BOTTLES DRAWN AEROBIC AND ANAEROBIC Performed at [facility], [address] [place], [place] [address]    Culture   Final    NO GROWTH 1 DAY Performed at [facility], [place] [address] [place], [place] [address]    Report Status PENDING  Incomplete  Blood Culture (routine x 2)     Status: None (Preliminary result)   Collection Time: 08/07/20  4:05 PM   Specimen: BLOOD RIGHT HAND  Result Value Ref Range Status   Specimen Description   Final    BLOOD RIGHT HAND BLOOD Performed at [facility], [address] [place], [place] [address]    Special Requests   Final    BOTTLES DRAWN AEROBIC AND ANAEROBIC Blood Culture results may not be optimal due to an inadequate volume of blood received in culture bottles Performed at [facility], [address] [place], [place] [address]    Culture   Final    NO GROWTH 1 DAY Performed at [facility], [place] [address], [place], [place] [address]    Report Status PENDING  Incomplete  Resp Panel by RT-PCR (Flu A&B, Covid) Peripheral     Status: None   Collection Time: 08/07/20  4:17 PM   Specimen: Peripheral; Nasopharyngeal(NP) swabs in vial transport medium  Result Value Ref Range Status   SARS Coronavirus 2 by RT PCR NEGATIVE NEGATIVE Final    Comment: (NOTE) SARS-CoV-2 target nucleic acids are NOT DETECTED.  The SARS-CoV-2 RNA is generally detectable in upper respiratory specimens during the acute phase of infection. The lowest concentration of SARS-CoV-2 viral copies this assay can detect is 138 copies/mL. A negative  result does not preclude SARS-Cov-2 infection and should not be used as the sole basis for treatment or other patient management decisions. A negative result may occur with  improper specimen collection/handling, submission of specimen other than nasopharyngeal swab, presence of viral mutation(s) within the areas targeted by this assay, and inadequate number of viral copies(<138 copies/mL). A negative result must be  combined with clinical observations, patient history, and epidemiological information. The expected result is Negative.  Fact Sheet for Patients:  EntrepreneurPulse.com.au  Fact Sheet for Healthcare Providers:  IncredibleEmployment.be  This test is no t yet approved or cleared by the [place] FDA and  has been authorized for detection and/or diagnosis of SARS-CoV-2 by FDA under an Emergency Use Authorization (EUA). This EUA will remain  in effect (meaning this test can be used) for the duration of the COVID-19 declaration under Section 564(b)(1) of the Act, 21 U.S.C.section 360bbb-3(b)(1), unless the authorization is terminated  or revoked sooner.       Influenza A by PCR NEGATIVE NEGATIVE Final   Influenza B by PCR NEGATIVE NEGATIVE Final    Comment: (NOTE) The Xpert Xpress SARS-CoV-2/FLU/RSV plus assay is intended as an aid in the diagnosis of influenza from Nasopharyngeal swab specimens and should not be used as a sole basis for treatment. Nasal washings and aspirates are unacceptable for Xpert Xpress SARS-CoV-2/FLU/RSV testing.  Fact Sheet for Patients: EntrepreneurPulse.com.au  Fact Sheet for Healthcare Providers: IncredibleEmployment.be  This test is not yet approved or cleared by the [place] FDA and has been authorized for detection and/or diagnosis of SARS-CoV-2 by FDA under an Emergency Use Authorization (EUA). This EUA will remain in effect (meaning this test can be used)  for the duration of the COVID-19 declaration under Section 564(b)(1) of the Act, 21 U.S.C. section 360bbb-3(b)(1), unless the authorization is terminated or revoked.  Performed at [facility], [address] [place], [place] [address]       Studies: CT SOFT TISSUE NECK W CONTRAST  Result Date: 08/09/2020 CLINICAL DATA:  Chronic neck pain. Difficulty swallowing. Ulcer tip stomatitis. EXAM: CT NECK WITH CONTRAST TECHNIQUE: Multidetector CT imaging of the neck was performed using the standard protocol following the bolus administration of intravenous contrast. CONTRAST:  36mL OMNIPAQUE IOHEXOL 300 MG/ML  SOLN COMPARISON:  None. FINDINGS: Pharynx and larynx: Normal. No mass or swelling. Salivary glands: No inflammation, mass, or stone. Thyroid: Hypoplastic thyroid bilaterally.  No thyroid mass. Lymph nodes: No enlarged lymph nodes in the neck. Vascular: Normal vascular enhancement. Mild atherosclerotic calcification in the carotid bifurcation bilaterally. Atherosclerotic calcification in the cavernous carotid and distal vertebral artery bilaterally. Limited intracranial: Negative Visualized orbits: No orbital lesion.  Bilateral cataract extraction Mastoids and visualized paranasal sinuses: Mucosal edema paranasal sinuses. Skeleton: Disc and facet degeneration and spurring multiple levels bilaterally. Bilateral foraminal stenosis. No acute skeletal abnormality. Upper chest: Lung apices clear bilaterally. Other: None IMPRESSION: Negative for mass or adenopathy in the neck Hypoplastic thyroid.  Correlate with thyroid function tests. Cervical spondylosis with foraminal encroachment bilaterally. Electronically Signed   By: Franchot Gallo M.D.   On: 08/09/2020 14:38    Scheduled Meds: . [START ON 08/11/2020] levothyroxine  50 mcg Intravenous Daily  . magic mouthwash w/lidocaine  5 mL Oral QID  . nystatin  5 mL Oral QID    Continuous Infusions: . sodium chloride 75 mL/hr at 08/09/20 0731  .  azithromycin 500 mg (08/09/20 0958)  . cefTRIAXone (ROCEPHIN)  IV 2 g (08/08/20 1730)  . folic acid (FOLVITE) IVPB 4 mg (08/09/20 1432)  . [START ON 1/66/0630] folic acid (FOLVITE) IVPB       LOS: 1 day     Alma Friendly, MD [facility]  If 7PM-7AM, please contact night-coverage [email or web address] 08/09/2020, 2:50 PM

## 2020-08-09 NOTE — Progress Notes (Signed)
OT Cancellation Note  Patient Details Name: [name] MRN: [id number] DOB: [birth year]   Cancelled Treatment:   Reason Eval/Treat Not Completed: Pain limiting ability to participate, per daughter, patient just received morphine and asked for therapy to come back later. OT to check back as time allows.   Gloris Manchester OTR/L Supplemental OT, Department of rehab services [phone]  Zalman Hull R H. 08/09/2020, 1:09 PM

## 2020-08-09 NOTE — Progress Notes (Signed)
PT Cancellation Note  Patient Details Name: [name] MRN: [id number] DOB: July 21, 1928   Cancelled Treatment:    Reason Eval/Treat Not Completed: Pain limiting ability to participate, per daughter, patient just received morphine and aske d for therapy to come back later.   Claretha Cooper 08/09/2020, 12:36 PM  [place] Pager [phone] Office [phone]

## 2020-08-10 DIAGNOSIS — N1831 Chronic kidney disease, stage 3a: Secondary | ICD-10-CM | POA: Diagnosis not present

## 2020-08-10 DIAGNOSIS — K121 Other forms of stomatitis: Secondary | ICD-10-CM | POA: Diagnosis not present

## 2020-08-10 DIAGNOSIS — J9601 Acute respiratory failure with hypoxia: Secondary | ICD-10-CM | POA: Diagnosis not present

## 2020-08-10 DIAGNOSIS — I10 Essential (primary) hypertension: Secondary | ICD-10-CM | POA: Diagnosis not present

## 2020-08-10 LAB — BASIC METABOLIC PANEL
Anion gap: 9 (ref 5–15)
BUN: 23 mg/dL (ref 8–23)
CO2: 20 mmol/L — ABNORMAL LOW (ref 22–32)
Calcium: 7.8 mg/dL — ABNORMAL LOW (ref 8.9–10.3)
Chloride: 110 mmol/L (ref 98–111)
Creatinine, Ser: 1.19 mg/dL (ref 0.61–1.24)
GFR, Estimated: 58 mL/min — ABNORMAL LOW (ref >60)
Glucose, Bld: 78 mg/dL (ref 70–99)
Potassium: 3.6 mmol/L (ref 3.5–5.1)
Sodium: 139 mmol/L (ref 135–145)

## 2020-08-10 LAB — CBC WITH DIFFERENTIAL/PLATELET
Abs Immature Granulocytes: 0.02 10*3/uL (ref 0.00–0.07)
Basophils Absolute: 0 10*3/uL (ref 0.0–0.1)
Basophils Relative: 0 %
Eosinophils Absolute: 0.1 10*3/uL (ref 0.0–0.5)
Eosinophils Relative: 3 %
HCT: 27.6 % — ABNORMAL LOW (ref 39.0–52.0)
Hemoglobin: 8.8 g/dL — ABNORMAL LOW (ref 13.0–17.0)
Immature Granulocytes: 1 %
Lymphocytes Relative: 38 %
Lymphs Abs: 1.3 10*3/uL (ref 0.7–4.0)
MCH: 29.9 pg (ref 26.0–34.0)
MCHC: 31.9 g/dL (ref 30.0–36.0)
MCV: 93.9 fL (ref 80.0–100.0)
Monocytes Absolute: 0.2 10*3/uL (ref 0.1–1.0)
Monocytes Relative: 5 %
Neutro Abs: 1.8 10*3/uL (ref 1.7–7.7)
Neutrophils Relative %: 53 %
Platelets: 86 10*3/uL — ABNORMAL LOW (ref 150–400)
RBC: 2.94 MIL/uL — ABNORMAL LOW (ref 4.22–5.81)
RDW: 11.9 % (ref 11.5–15.5)
WBC: 3.3 10*3/uL — ABNORMAL LOW (ref 4.0–10.5)
nRBC: 0 % (ref 0.0–0.2)

## 2020-08-10 LAB — PATHOLOGIST SMEAR REVIEW

## 2020-08-10 LAB — GLUCOSE, CAPILLARY
Glucose-Capillary: 73 mg/dL (ref 70–99)
Glucose-Capillary: 90 mg/dL (ref 70–99)
Glucose-Capillary: 93 mg/dL (ref 70–99)

## 2020-08-10 LAB — IRON AND TIBC
Iron: 37 ug/dL — ABNORMAL LOW (ref 45–182)
Saturation Ratios: 18 % (ref 17.9–39.5)
TIBC: 210 ug/dL — ABNORMAL LOW (ref 250–450)
UIBC: 173 ug/dL

## 2020-08-10 LAB — FERRITIN: Ferritin: 338 ng/mL — ABNORMAL HIGH (ref 24–336)

## 2020-08-10 LAB — FOLATE: Folate: 45.8 ng/mL (ref >5.9)

## 2020-08-10 LAB — VITAMIN B12: Vitamin B-12: 509 pg/mL (ref 180–914)

## 2020-08-10 MED ORDER — DIPHENHYDRAMINE HCL 50 MG/ML IJ SOLN
25.0000 mg | Freq: Once | INTRAMUSCULAR | Status: AC
  Administered 2020-08-12: 25 mg via INTRAVENOUS
  Filled 2020-08-10 (×2): qty 1

## 2020-08-10 MED ORDER — DEXTROSE-NACL 5-0.45 % IV SOLN: INTRAVENOUS | Status: DC

## 2020-08-10 NOTE — Progress Notes (Signed)
PROGRESS NOTE  [name] KWI:[id number] DOB: [birth year] DOA: 08/07/2020 PCP: [name], DO  HPI/Recap of past 24 hours: HPI from Dr [name] is a 87 y.o. male with medical history significant for hypertension, CKD stage IIIa, colon cancer status post hemicolectomy and chemotherapy, hypothyroidism, memory loss, and rheumatoid arthritis, now presenting to the emergency department for evaluation of mouth and throat pain, cough, and shortness of breath.  Patient was previously treated with methotrexate for his rheumatoid arthritis (noted mild reaction), but had been off of that for several years until resuming on 08/01/2020.  1-2 days later he began to develop sore throat and sore mouth which has progressively worsened to the point where he is unable to eat or drink due to pain.  He has also developed a productive cough and shortness of breath over the past couple days.  He was evaluated by his PCP for the symptoms and started on azithromycin. In the ED, pt found to have a temperature 37.8 C, saturating low 90s on 4 L/min of O2, slightly tachypneic, and with stable blood pressure.  EKG features sinus rhythm and chest x-rays negative for acute cardiopulmonary disease. Lactic acid is normal and COVID-19 screening test negative.  Cultures collected in the emergency department and the patient was treated with IV fluids and Rocephin.  Patient admitted for further management.     Today, patient still with significant stomatitis, odynophagia, unable to tolerate orally.  Noted dentures present, removed for better comfort.  Denies any other new complaints.  Daughter at bedside.   Assessment/Plan: Principal Problem:   Ulcerative stomatitis Active Problems:   Essential hypertension   Rheumatoid arthritis (HCC)   Chronic kidney disease, stage 3a (HCC)   Leukopenia   Acute respiratory failure with hypoxia (HCC)   SIRS (systemic inflammatory response syndrome) (HCC)   Methotrexate adverse  reaction, initial encounter    Stomatitis likely 2/2 methotrexate Noted mouth and throat pain with oral lesions Symptomatic management with Magic mouthwash, pain management Continue high-dose IV folic acid If no much improvement, may try leucovorin (daughter and patient reluctant) IV fluids N.p.o. for now--> if no significant improvement/patient unable to tolerate may consider NG/core track for feeding temporarily (patient and daughter reluctant) Monitor closely  Acute hypoxic respiratory failure Currently saturating well on room air Chest x-ray unremarkable CT neck soft tissue, unremarkable for any edema/swelling Possibly ?PNA, ?  Aspiration given above Continue Rocephin, azithromycin Continue supplemental O2 as needed  SIRS Noted tachypnea, leukopenia (possibly related to methotrexate) UA negative, BC x2 NGTD Continue Rocephin, azithromycin for possible pneumonia  Pancytopenia Likely 2/2 ??methotrexate Discussed with Dr. Alvy Bimler on 08/09/2020, agree that pancytopenia and stomatitis are likely 2/2 methotrexate.  Agree with present management Daily CBC  Possible oral thrush Start nystatin prn  AKI on CKD stage IIIa Creatinine on admission 1.52, baseline around 1.2 Likely in the setting of poor oral intake Continue to hold losartan/hydrochlorothiazide Continue IV fluid until able to tolerate adequately  Anemia of chronic kidney disease Baseline hemoglobin between 10-11 Daily CBC  Hypertension BP stable Continue to hold losartan/HCTZ due to mild AKI  Hypothyroidism Switch oral Synthroid to IV  Rheumatoid arthritis Discontinue methotrexate Follow-up with PCP, rheumatologist (follows in the [name])     Estimated body mass index is 24.41 kg/m as calculated from the following:   Height as of this encounter: 5' (1.524 m).   Weight as of this encounter: 56.7 kg.     Code Status: Full  Family Communication: Discussed  with daughter at bedside on  08/10/2020  Disposition Plan: Status is: Inpatient  The patient will require care spanning > 2 midnights and should be moved to inpatient because: IV treatments appropriate due to intensity of illness or inability to take PO  Dispo: The patient is from: Home              Anticipated d/c is to: Home              Patient currently is not medically stable to d/c.   Difficult to place patient No    Consultants:  Discussed with oncology  Procedures:  None  Antimicrobials:  Rocephin  Azithromycin  DVT prophylaxis: Hold Heparin [place] due to thrombocytopenia, mild oozing from oral cavity   Objective: Vitals:   08/09/20 0709 08/09/20 2250 08/10/20 0602 08/10/20 1420  BP:  137/65 133/63 135/61  Pulse:  77 75 65  Resp:  20 18 15   Temp:  97.8 F (36.6 C) 97.8 F (36.6 C) 98.8 F (37.1 C)  TempSrc:  Oral Oral Oral  SpO2:  95% 97% 96%  Weight: 56.7 kg     Height:        Intake/Output Summary (Last 24 hours) at 08/10/2020 1634 Last data filed at 08/10/2020 9417 Gross per 24 hour  Intake -  Output 1200 ml  Net -1200 ml   Filed Weights   08/07/20 1541 08/07/20 2048 08/09/20 0709  Weight: 61.2 kg 54.1 kg 56.7 kg    Exam:  General: NAD, stomatitis noted in oral cavity with mild oozing, dentures removed  Cardiovascular: S1, S2 present  Respiratory: CTAB  Abdomen: Soft, nontender, nondistended, bowel sounds present  Musculoskeletal: No bilateral pedal edema noted  Skin: Normal  Psychiatry: Normal mood   Data Reviewed: CBC: Recent Labs  Lab 08/06/20 1552 08/07/20 1608 08/08/20 0535 08/09/20 0726 08/10/20 0653  WBC 8.2 2.4* 3.8* 3.3* 3.3*  NEUTROABS 5.3  --  3.0 2.1 1.8  HGB 13.2 12.6* 10.6* 9.7* 8.8*  HCT 39.0 37.9* 31.8* 29.6* 27.6*  MCV 90 90.0 90.9 92.2 93.9  PLT 200 164 125* 93* 86*   Basic Metabolic Panel: Recent Labs  Lab 08/06/20 1552 08/07/20 1608 08/08/20 0535 08/09/20 0726 08/10/20 0653  NA 139 136 138 140 139  K 4.1 3.6 3.7 3.6 3.6   CL 100 99 105 110 110  CO2 21 25 22 23  20*  GLUCOSE 101* 148* 144* 99 78  BUN 28 32* 31* 28* 23  CREATININE 1.25 1.52* 1.34* 1.29* 1.19  CALCIUM 9.4 9.6 8.6* 8.1* 7.8*   GFR: Estimated Creatinine Clearance: 29.2 mL/min (by C-G formula based on SCr of 1.19 mg/dL). Liver Function Tests: Recent Labs  Lab 08/06/20 1552 08/07/20 1608  AST 26 33  ALT 22 24  ALKPHOS 96 67  BILITOT 0.5 0.6  PROT 7.6 8.4*  ALBUMIN 4.5 4.0   Recent Labs  Lab 08/07/20 1608  LIPASE 30   No results for input(s): AMMONIA in the last 168 hours. Coagulation Profile: Recent Labs  Lab 08/07/20 1608  INR 1.1   Cardiac Enzymes: No results for input(s): CKTOTAL, CKMB, CKMBINDEX, TROPONINI in the last 168 hours. BNP (last 3 results) No results for input(s): PROBNP in the last 8760 hours. HbA1C: No results for input(s): HGBA1C in the last 72 hours. CBG: Recent Labs  Lab 08/09/20 1719 08/09/20 2251 08/10/20 0604 08/10/20 1224  GLUCAP 83 81 73 90   Lipid Profile: No results for input(s): CHOL, HDL, LDLCALC, TRIG, CHOLHDL, LDLDIRECT in  the last 72 hours. Thyroid Function Tests: No results for input(s): TSH, T4TOTAL, FREET4, T3FREE, THYROIDAB in the last 72 hours. Anemia Panel: Recent Labs    08/10/20 0653  VITAMINB12 509  FOLATE 45.8  FERRITIN 338*  TIBC 210*  IRON 37*   Urine analysis:    Component Value Date/Time   COLORURINE YELLOW 08/08/2020 [place] 08/08/2020 1747   LABSPEC 1.020 08/08/2020 1747   PHURINE 5.0 08/08/2020 1747   GLUCOSEU NEGATIVE 08/08/2020 1747   HGBUR NEGATIVE 08/08/2020 1747   HGBUR negative 03/17/2010 1010   BILIRUBINUR NEGATIVE 08/08/2020 1747   KETONESUR 5 (A) 08/08/2020 1747   PROTEINUR NEGATIVE 08/08/2020 1747   UROBILINOGEN 0.2 03/17/2010 1010   NITRITE NEGATIVE 08/08/2020 1747   LEUKOCYTESUR NEGATIVE 08/08/2020 1747   Sepsis Labs: @LABRCNTIP (procalcitonin:4,lacticidven:4)  ) Recent Results (from the past 240 hour(s))  Culture,  Group A Strep     Status: None   Collection Time: 08/06/20  3:27 PM   Specimen: Throat  Result Value Ref Range Status   MICRO NUMBER: [id number]  Final   SPECIMEN QUALITY: Adequate  Final   SOURCE: THROAT  Final   STATUS: FINAL  Final   RESULT: No group A Streptococcus isolated  Final  Blood Culture (routine x 2)     Status: None (Preliminary result)   Collection Time: 08/07/20  4:00 PM   Specimen: BLOOD RIGHT ARM  Result Value Ref Range Status   Specimen Description   Final    BLOOD RIGHT ARM BLOOD Performed at [facility], [address] [place], [place] [address]    Special Requests   Final    Blood Culture adequate volume BOTTLES DRAWN AEROBIC AND ANAEROBIC Performed at [facility], [address] [place], [place] [address]    Culture   Final    NO GROWTH 2 DAYS Performed at [facility], [place] [address] [place], [place] [address]    Report Status PENDING  Incomplete  Blood Culture (routine x 2)     Status: None (Preliminary result)   Collection Time: 08/07/20  4:05 PM   Specimen: BLOOD RIGHT HAND  Result Value Ref Range Status   Specimen Description   Final    BLOOD RIGHT HAND BLOOD Performed at [facility], [address] [place], [place] [address]    Special Requests   Final    BOTTLES DRAWN AEROBIC AND ANAEROBIC Blood Culture results may not be optimal due to an inadequate volume of blood received in culture bottles Performed at [facility], [address] [place], [place] [address]    Culture   Final    NO GROWTH 2 DAYS Performed at [facility], [place] [address] [place], [place] [address]    Report Status PENDING  Incomplete  Resp Panel by RT-PCR (Flu A&B, Covid) Peripheral     Status: None   Collection Time: 08/07/20  4:17 PM   Specimen: Peripheral; Nasopharyngeal(NP) swabs in vial transport medium  Result Value Ref Range Status   SARS Coronavirus 2 by RT PCR NEGATIVE NEGATIVE Final    Comment:  (NOTE) SARS-CoV-2 target nucleic acids are NOT DETECTED.  The SARS-CoV-2 RNA is generally detectable in upper respiratory specimens during the acute phase of infection. The lowest concentration of SARS-CoV-2 viral copies this assay can detect is 138 copies/mL. A negative result does not preclude SARS-Cov-2 infection and should not be used as the sole  basis for treatment or other patient management decisions. A negative result may occur with  improper specimen collection/handling, submission of specimen other than nasopharyngeal swab, presence of viral mutation(s) within the areas targeted by this assay, and inadequate number of viral copies(<138 copies/mL). A negative result must be combined with clinical observations, patient history, and epidemiological information. The expected result is Negative.  Fact Sheet for Patients:  EntrepreneurPulse.com.au  Fact Sheet for Healthcare Providers:  IncredibleEmployment.be  This test is no t yet approved or cleared by the [place] FDA and  has been authorized for detection and/or diagnosis of SARS-CoV-2 by FDA under an Emergency Use Authorization (EUA). This EUA will remain  in effect (meaning this test can be used) for the duration of the COVID-19 declaration under Section 564(b)(1) of the Act, 21 U.S.C.section 360bbb-3(b)(1), unless the authorization is terminated  or revoked sooner.       Influenza A by PCR NEGATIVE NEGATIVE Final   Influenza B by PCR NEGATIVE NEGATIVE Final    Comment: (NOTE) The Xpert Xpress SARS-CoV-2/FLU/RSV plus assay is intended as an aid in the diagnosis of influenza from Nasopharyngeal swab specimens and should not be used as a sole basis for treatment. Nasal washings and aspirates are unacceptable for Xpert Xpress SARS-CoV-2/FLU/RSV testing.  Fact Sheet for Patients: EntrepreneurPulse.com.au  Fact Sheet for Healthcare  Providers: IncredibleEmployment.be  This test is not yet approved or cleared by the [place] FDA and has been authorized for detection and/or diagnosis of SARS-CoV-2 by FDA under an Emergency Use Authorization (EUA). This EUA will remain in effect (meaning this test can be used) for the duration of the COVID-19 declaration under Section 564(b)(1) of the Act, 21 U.S.C. section 360bbb-3(b)(1), unless the authorization is terminated or revoked.  Performed at [facility], [address] [place], [place] [address]       Studies: No results found.  Scheduled Meds: . diphenhydrAMINE  25 mg Intravenous Once  . [START ON 08/11/2020] levothyroxine  50 mcg Intravenous Daily  . magic mouthwash w/lidocaine  5 mL Oral QID  . nystatin  5 mL Oral QID    Continuous Infusions: . cefTRIAXone (ROCEPHIN)  IV 2 g (08/09/20 1704)  . dextrose 5 % and 0.45% NaCl 75 mL/hr at 08/10/20 0810  . folic acid (FOLVITE) IVPB 4 mg (08/10/20 1240)     LOS: 2 days     Alma Friendly, MD [facility]  If 7PM-7AM, please contact night-coverage [email or web address] 08/10/2020, 4:34 PM

## 2020-08-10 NOTE — Evaluation (Signed)
Physical Therapy Evaluation Patient Details Name: [name] MRN: [id number] DOB: 1929-02-14 Today's Date: 08/10/2020   History of Present Illness  87 y.o. male with medical history significant for hypertension, CKD stage IIIa, colon cancer status post hemicolectomy and chemotherapy, hypothyroidism, memory loss, and rheumatoid arthritis, now presenting to the emergency department for evaluation of mouth and throat pain, cough, and shortness of breath. Dx of stomatitis, respiratory failure, SIRS.  Clinical Impression  Pt admitted with above diagnosis. Pt ambulated 100' with RW with min/guard assist and verbal cues for safe positioning in RW, no loss of balance. Instructed pt in ankle pumps and quadriceps isometric exercises to be done independently for strengthening.  Pt currently with functional limitations due to the deficits listed below (see PT Problem List). Pt will benefit from skilled PT to increase their independence and safety with mobility to allow discharge to the venue listed below.       Follow Up Recommendations Home health PT    Equipment Recommendations  None recommended by PT    Recommendations for Other Services       Precautions / Restrictions Precautions Precautions: Fall Precaution Comments: 1 fall in the past 6 months Restrictions Weight Bearing Restrictions: No      Mobility  Bed Mobility Overal bed mobility: Needs Assistance Bed Mobility: Supine to Sit     Supine to sit: Mod assist     General bed mobility comments: mod A to raise trunk    Transfers Overall transfer level: Needs assistance Equipment used: Rolling walker (2 wheeled) Transfers: Sit to/from Stand Sit to Stand: Min assist         General transfer comment: VCs hand placement  Ambulation/Gait Ambulation/Gait assistance: Min guard Gait Distance (Feet): 100 Feet Assistive device: Rolling walker (2 wheeled) Gait Pattern/deviations: Step-through pattern;Decreased step length -  right;Decreased step length - left Gait velocity: decr   General Gait Details: VCs for positioning in RW, no loss of balance  Stairs            Wheelchair Mobility    Modified Rankin (Stroke Patients Only)       Balance Overall balance assessment: Modified Independent                                           Pertinent Vitals/Pain Pain Assessment: No/denies pain    Home Living Family/patient expects to be discharged to:: Private residence Living Arrangements: Spouse/significant other Available Help at Discharge: Family;Available 24 hours/day   Home Access: Stairs to enter Entrance Stairs-Rails: Can reach both;Left;Right Entrance Stairs-Number of Steps: 3 Home Layout: Two level;Able to live on main level with bedroom/bathroom Home Equipment: Gilford Rile - 2 wheels;Shower seat;Cane - quad;Walker - 4 wheels      Prior Function Level of Independence: Independent with assistive device(s)         Comments: walks with cane, wife assists with ADLs     Hand Dominance   Dominant Hand: Right    Extremity/Trunk Assessment   Upper Extremity Assessment Upper Extremity Assessment: Defer to OT evaluation    Lower Extremity Assessment Lower Extremity Assessment: Overall WFL for tasks assessed    Cervical / Trunk Assessment Cervical / Trunk Assessment: Normal  Communication   Communication: Prefers language other than [place];Interpreter utilized (video interpreter used, then daughter arrived and translated)  Cognition Arousal/Alertness: Awake/alert Behavior During Therapy: WFL for tasks assessed/performed Overall Cognitive Status: Within Functional  Limits for tasks assessed                                        General Comments      Exercises  ankle pumps x 10 B AROM supine Knee press x 5 B AROM supine   Assessment/Plan    PT Assessment Patient needs continued PT services  PT Problem List Decreased strength;Decreased  activity tolerance;Decreased mobility       PT Treatment Interventions Gait training;Therapeutic activities;Therapeutic exercise    PT Goals (Current goals can be found in the Care Plan section)  Acute Rehab PT Goals Patient Stated Goal: help with housework, gardening PT Goal Formulation: With patient/family Time For Goal Achievement: 08/24/20 Potential to Achieve Goals: Good    Frequency Min 3X/week   Barriers to discharge        Co-evaluation PT/OT/SLP Co-Evaluation/Treatment: Yes Reason for Co-Treatment: To address functional/ADL transfers PT goals addressed during session: Mobility/safety with mobility;Balance;Proper use of DME         AM-PAC PT "6 Clicks" Mobility  Outcome Measure Help needed turning from your back to your side while in a flat bed without using bedrails?: A Little Help needed moving from lying on your back to sitting on the side of a flat bed without using bedrails?: A Lot Help needed moving to and from a bed to a chair (including a wheelchair)?: A Little Help needed standing up from a chair using your arms (e.g., wheelchair or bedside chair)?: A Little Help needed to walk in hospital room?: A Little Help needed climbing 3-5 steps with a railing? : A Little 6 Click Score: 17    End of Session Equipment Utilized During Treatment: Gait belt Activity Tolerance: Patient tolerated treatment well Patient left: in chair;with call bell/phone within reach;with chair alarm set;with family/visitor present Nurse Communication: Mobility status PT Visit Diagnosis: Difficulty in walking, not elsewhere classified (R26.2)    Time: 3500-9381 PT Time Calculation (min) (ACUTE ONLY): 38 min   Charges:   PT Evaluation $PT Eval Low Complexity: 1 Low PT Treatments $Gait Training: 8-22 mins        Blondell Reveal Kistler PT 08/10/2020  Acute Rehabilitation Services Pager [phone] Office [phone]

## 2020-08-10 NOTE — Progress Notes (Signed)
Occupational Therapy Evaluation  Patient lives at home with spouse in two story house, can stay on main level. Prior to onset of weakness due to inability to eat patient was ambulatory with intermittent use of cane per daughter. Patient enjoyed gardening and would assist spouse with IADLs. Since onset of weakness patient needing assist from spouse for bathing/dressing and has not been staying on main level of house. Patient presents with generalized weakness, decreased activity tolerance and balance needing set up A for UB ADL, mod to max A for LB ADLs and min A for functional transfers. Recommend continued acute OT services to maximize patient endurance, safety, balance and strength in order to facilitate D/C to venue listed below.    08/10/20 1500  OT Visit Information  Last OT Received On 08/10/20  Assistance Needed +1  PT/OT/SLP Co-Evaluation/Treatment Yes  Reason for Co-Treatment To address functional/ADL transfers  OT goals addressed during session ADL's and self-care  History of Present Illness 87 y.o. male with medical history significant for hypertension, CKD stage IIIa, colon cancer status post hemicolectomy and chemotherapy, hypothyroidism, memory loss, and rheumatoid arthritis, now presenting to the emergency department for evaluation of mouth and throat pain, cough, and shortness of breath. Dx of stomatitis, respiratory failure, SIRS.  Precautions  Precautions Fall  Precaution Comments 1 fall in the past 6 months  Restrictions  Weight Bearing Restrictions No  Home Living  Family/patient expects to be discharged to: Private residence  Living Arrangements Spouse/significant other  Available Help at Discharge Family;Available 24 hours/day  Type of Home House  Home Access Stairs to enter  Entrance Stairs-Number of Steps 3  Entrance Stairs-Rails Can reach both;Left;Right  Home Layout Two level;Able to live on main level with bedroom/bathroom  Alternate Level Stairs-Number of Steps  1 flight  Bathroom Shower/Tub Walk-in Patent examiner - 2 wheels;Shower seat;Cane - quad;Walker - 4 wheels  Prior Function  Level of Independence Needs assistance  Gait / Transfers Assistance Needed walks with cane intermittently per DTR  ADL's / Homemaking Assistance Needed per patient has assist from spouse with ADLs such as dressing/bathing due to recent weakness  Communication  Communication Prefers language other than [place];Interpreter utilized  Pain Assessment  Pain Assessment Faces  Faces Pain Scale 0  Cognition  Arousal/Alertness Awake/alert  Behavior During Therapy WFL for tasks assessed/performed  Overall Cognitive Status Within Functional Limits for tasks assessed  Upper Extremity Assessment  Upper Extremity Assessment Generalized weakness;RUE deficits/detail  RUE Deficits / Details limited shoulder AROM due to hx of fall, not formally assessed  Lower Extremity Assessment  Lower Extremity Assessment Defer to PT evaluation  Cervical / Trunk Assessment  Cervical / Trunk Assessment Normal  ADL  Overall ADL's  Needs assistance/impaired  Grooming Set up;Sitting  Upper Body Bathing Supervision/ safety;Set up;Sitting  Lower Body Bathing Moderate assistance;Sitting/lateral leans;Sit to/from stand  Upper Body Dressing  Set up;Supervision/safety;Sitting  Lower Body Dressing Sitting/lateral leans;Sit to/from stand;Maximal assistance  Lower Body Dressing Details (indicate cue type and reason) to don socks  Toilet Transfer Minimal assistance;Cueing for safety;Ambulation;RW  Toilet Transfer Details (indicate cue type and reason) needing cues to step closer within frame of walker, cues for hand placement during sit <> stand  Toileting- Clothing Manipulation and Hygiene Minimal assistance;Sitting/lateral lean;Sit to/from stand  Functional mobility during ADLs Minimal assistance;Rolling walker;Cueing for safety  Bed Mobility  Overal bed  mobility Needs Assistance  Bed Mobility Supine to Sit  Supine to sit Mod assist  General  bed mobility comments mod A to raise trunk  Transfers  Overall transfer level Needs assistance  Equipment used Rolling walker (2 wheeled)  Transfers Sit to/from Stand  Sit to Stand Min assist  General transfer comment please see toilet transfer in ADL section  Balance  Overall balance assessment History of Falls;Needs assistance  Sitting-balance support Feet supported  Sitting balance-Leahy Scale Fair  Standing balance support Bilateral upper extremity supported  Standing balance-Leahy Scale Poor  OT - End of Session  Equipment Utilized During Treatment Gait belt;Rolling walker  Activity Tolerance Patient tolerated treatment well  Patient left in chair;with call bell/phone within reach;with chair alarm set;with family/visitor present  Nurse Communication Mobility status  OT Assessment  OT Recommendation/Assessment Patient needs continued OT Services  OT Visit Diagnosis Other abnormalities of gait and mobility (R26.89);Muscle weakness (generalized) (M62.81);History of falling (Z91.81)  OT Problem List Decreased strength;Decreased activity tolerance;Impaired balance (sitting and/or standing);Decreased safety awareness;Pain  OT Plan  OT Frequency (ACUTE ONLY) Min 2X/week  OT Treatment/Interventions (ACUTE ONLY) Self-care/ADL training;Therapeutic exercise;Therapeutic activities;Balance training;Patient/family education  AM-PAC OT "6 Clicks" Daily Activity Outcome Measure (Version 2)  Help from another person eating meals? 3  Help from another person taking care of personal grooming? 3  Help from another person toileting, which includes using toliet, bedpan, or urinal? 3  Help from another person bathing (including washing, rinsing, drying)? 2  Help from another person to put on and taking off regular upper body clothing? 3  Help from another person to put on and taking off regular lower body  clothing? 2  6 Click Score 16  OT Recommendation  Follow Up Recommendations Home health OT;Supervision/Assistance - 24 hour;Other (comment) (vs SNF pending rehab, anticipate will progress well once able to eat)  OT Equipment None recommended by OT  Individuals Consulted  Consulted and Agree with Results and Recommendations Family member/caregiver  Family Member Consulted DTR  Acute Rehab OT Goals  Patient Stated Goal help with housework, gardening  OT Goal Formulation With patient/family  Time For Goal Achievement 08/24/20  Potential to Achieve Goals Good  OT Time Calculation  OT Start Time (ACUTE ONLY) 1323  OT Stop Time (ACUTE ONLY) 1358  OT Time Calculation (min) 35 min  OT General Charges  $OT Visit 1 Visit  OT Evaluation  $OT Eval Low Complexity 1 Low  Written Expression  Dominant Hand Right   Delbert Phenix OT OT pager: [phone]

## 2020-08-11 DIAGNOSIS — K121 Other forms of stomatitis: Secondary | ICD-10-CM | POA: Diagnosis not present

## 2020-08-11 LAB — BASIC METABOLIC PANEL
Anion gap: 7 (ref 5–15)
BUN: 18 mg/dL (ref 8–23)
CO2: 20 mmol/L — ABNORMAL LOW (ref 22–32)
Calcium: 7.8 mg/dL — ABNORMAL LOW (ref 8.9–10.3)
Chloride: 110 mmol/L (ref 98–111)
Creatinine, Ser: 0.84 mg/dL (ref 0.61–1.24)
GFR, Estimated: >60 mL/min (ref >60)
Glucose, Bld: 122 mg/dL — ABNORMAL HIGH (ref 70–99)
Potassium: 3.2 mmol/L — ABNORMAL LOW (ref 3.5–5.1)
Sodium: 137 mmol/L (ref 135–145)

## 2020-08-11 LAB — CBC WITH DIFFERENTIAL/PLATELET
Abs Immature Granulocytes: 0.01 10*3/uL (ref 0.00–0.07)
Basophils Absolute: 0 10*3/uL (ref 0.0–0.1)
Basophils Relative: 0 %
Eosinophils Absolute: 0.2 10*3/uL (ref 0.0–0.5)
Eosinophils Relative: 7 %
HCT: 28.2 % — ABNORMAL LOW (ref 39.0–52.0)
Hemoglobin: 9.2 g/dL — ABNORMAL LOW (ref 13.0–17.0)
Immature Granulocytes: 0 %
Lymphocytes Relative: 40 %
Lymphs Abs: 0.9 10*3/uL (ref 0.7–4.0)
MCH: 29.6 pg (ref 26.0–34.0)
MCHC: 32.6 g/dL (ref 30.0–36.0)
MCV: 90.7 fL (ref 80.0–100.0)
Monocytes Absolute: 0.1 10*3/uL (ref 0.1–1.0)
Monocytes Relative: 6 %
Neutro Abs: 1 10*3/uL — ABNORMAL LOW (ref 1.7–7.7)
Neutrophils Relative %: 47 %
Platelets: 80 10*3/uL — ABNORMAL LOW (ref 150–400)
RBC: 3.11 MIL/uL — ABNORMAL LOW (ref 4.22–5.81)
RDW: 11.9 % (ref 11.5–15.5)
WBC: 2.2 10*3/uL — ABNORMAL LOW (ref 4.0–10.5)
nRBC: 0 % (ref 0.0–0.2)

## 2020-08-11 LAB — GLUCOSE, CAPILLARY
Glucose-Capillary: 118 mg/dL — ABNORMAL HIGH (ref 70–99)
Glucose-Capillary: 116 mg/dL — ABNORMAL HIGH (ref 70–99)
Glucose-Capillary: 160 mg/dL — ABNORMAL HIGH (ref 70–99)
Glucose-Capillary: 94 mg/dL (ref 70–99)

## 2020-08-11 LAB — MAGNESIUM: Magnesium: 2.1 mg/dL (ref 1.7–2.4)

## 2020-08-11 MED ORDER — CHLORHEXIDINE GLUCONATE 0.12 % MT SOLN
5.0000 mL | Freq: Three times a day (TID) | OROMUCOSAL | Status: DC
  Administered 2020-08-11: 5 mL via OROMUCOSAL
  Filled 2020-08-11: qty 15

## 2020-08-11 MED ORDER — POTASSIUM CHLORIDE 10 MEQ/100ML IV SOLN
10.0000 meq | INTRAVENOUS | Status: AC
  Administered 2020-08-11 (×3): 10 meq via INTRAVENOUS
  Filled 2020-08-11 (×4): qty 100

## 2020-08-11 MED ORDER — TRAMADOL HCL 50 MG PO TABS: 25.0000 mg | ORAL_TABLET | Freq: Four times a day (QID) | ORAL | Status: DC | PRN

## 2020-08-11 MED ORDER — CHLORHEXIDINE GLUCONATE 0.12 % MT SOLN
5.0000 mL | Freq: Four times a day (QID) | OROMUCOSAL | Status: DC
  Administered 2020-08-11 – 2020-08-13 (×9): 5 mL via OROMUCOSAL
  Filled 2020-08-11 (×9): qty 15

## 2020-08-11 MED ORDER — POTASSIUM CHLORIDE 10 MEQ/100ML IV SOLN
10.0000 meq | Freq: Once | INTRAVENOUS | Status: AC
  Administered 2020-08-11: 10 meq via INTRAVENOUS

## 2020-08-11 MED ORDER — TRAMADOL 5 MG/ML ORAL SUSPENSION
25.0000 mg | Freq: Four times a day (QID) | ORAL | Status: DC | PRN
Start: 2020-08-11 — End: 2020-08-11

## 2020-08-11 NOTE — Progress Notes (Signed)
PROGRESS NOTE    [name]  WCH:[id number] DOB: 1928/05/23 DOA: 08/07/2020 PCP: [name], DO    Brief Narrative:HPI from Dr Wilmon Arms Hois a 87 y.o.malewith medical history significant forhypertension, CKD stage IIIa, colon cancer status post hemicolectomy and chemotherapy, hypothyroidism, memory loss, and rheumatoid arthritis, now presenting to the emergency department for evaluation of mouth and throat pain, cough, and shortness of breath. Patient was previously treated with methotrexate for his rheumatoid arthritis (noted mild reaction), but had been off of that for several years until resuming on 08/01/2020.1-2 days later he began to develop sore throat and sore mouth which has progressively worsened to the point where he is unable to eat or drink due to pain. He has also developed a productive cough and shortness of breath over the past couple days. He was evaluated by his PCP for the symptoms and started on azithromycin. In the ED, pt found to have a temperature 37.8C, saturating low 90s on 4 L/min of O2, slightly tachypneic, and with stable blood pressure. EKG features sinus rhythm and chest x-rays negative for acute cardiopulmonary disease. Lactic acid is normal and COVID-19 screening test negative. Cultures collected in the emergency department and the patient was treated with IV fluids and Rocephin.  Patient admitted for further management.   Assessment & Plan:   Principal Problem:   Ulcerative stomatitis Active Problems:   Essential hypertension   Rheumatoid arthritis ([place])   Chronic kidney disease, stage 3a (HCC)   Leukopenia   Acute respiratory failure with hypoxia (HCC)   SIRS (systemic inflammatory response syndrome) (HCC)   Methotrexate adverse reaction, initial encounter    1 methotrexate induced stomatitis patient admitted with mouth pain throat pain with oral lesions.  He was being treated symptomatically with morphine and Magic mouthwash and  high-dose folic acid.  Discussed in detail with patient's daughter who was in the room.  Agreed to try chlorhexidine gluconate mouthwash and to start him on clear liquids.  Patient tolerated the first dose of mouthwash without any problems.  We will try to advance diet as tolerated however discussed with patient and daughter regarding placement of an NG tube temporarily for nutrition.  2 status post acute hypoxic respiratory failure resolved.  Question pneumonia question aspiration however he is on room air now.  On Rocephin azithromycin.  3 SIRS patient met criteria for SIRS on admission with tachypnea and leukopenia.  On Rocephin azithromycin for possible pneumonia finish the course of 5 days and stop.  4 pancytopenia likely induced by methotrexate continue current management.  5 rheumatoid arthritis [facility] by [facility]ologist.  6 hypothyroidism continue Synthroid IV.  7 AKI on CKD stage IIIa baseline creatinine around 1.2  HCTZ and losartan on hold due to soft blood pressure.  8 hypokalemia replete check magnesium recheck labs in a.m.  Estimated body mass index is 24.41 kg/m as calculated from the following:   Height as of this encounter: 5' (1.524 m).   Weight as of this encounter: 56.7 kg.  DVT prophylaxis: SCD  code Status: Full code Family Communication: Discussed with daughter at bedside  disposition Plan:  Status is: Inpatient  Dispo: The patient is from: Home              Anticipated d/c is to: Home              Patient currently is not medically stable to d/c.   Difficult to place patient not applicable   Consultants:   None  Procedures: None Antimicrobials: None  Subjective: Is resting in bed complains of pain in the mouth multiple ulcerations noted under the tongue  Objective: Vitals:   08/10/20 0602 08/10/20 1420 08/10/20 2039 08/11/20 0458  BP: 133/63 135/61 135/63 132/64  Pulse: 75 65 65 64  Resp: '18 15 14 14  ' Temp: 97.8 F (36.6 C) 98.8 F  (37.1 C) 99 F (37.2 C) 97.6 F (36.4 C)  TempSrc: Oral Oral Oral Oral  SpO2: 97% 96% 98% 97%  Weight:      Height:        Intake/Output Summary (Last 24 hours) at 08/11/2020 1320 Last data filed at 08/11/2020 0328 Gross per 24 hour  Intake 1540.89 ml  Output --  Net 1540.89 ml   Filed Weights   08/07/20 1541 08/07/20 2048 08/09/20 0709  Weight: 61.2 kg 54.1 kg 56.7 kg    Examination:  General exam: Appears calm and comfortable  Respiratory system: Clear to auscultation. Respiratory effort normal. Cardiovascular system: S1 & S2 heard, RRR. No JVD, murmurs, rubs, gallops or clicks. No pedal edema. Gastrointestinal system: Abdomen is nondistended, soft and nontender. No organomegaly or masses felt. Normal bowel sounds heard. Central nervous system: Alert and oriented. No focal neurological deficits. Extremities: Symmetric 5 x 5 power. Skin: No rashes, lesions or ulcers Psychiatry: Judgement and insight appear normal. Mood & affect appropriate.     Data Reviewed: I have personally reviewed following labs and imaging studies  CBC: Recent Labs  Lab 08/06/20 1552 08/07/20 1608 08/08/20 0535 08/09/20 0726 08/10/20 0653 08/11/20 0542  WBC 8.2 2.4* 3.8* 3.3* 3.3* 2.2*  NEUTROABS 5.3  --  3.0 2.1 1.8 1.0*  HGB 13.2 12.6* 10.6* 9.7* 8.8* 9.2*  HCT 39.0 37.9* 31.8* 29.6* 27.6* 28.2*  MCV 90 90.0 90.9 92.2 93.9 90.7  PLT 200 164 125* 93* 86* 80*   Basic Metabolic Panel: Recent Labs  Lab 08/07/20 1608 08/08/20 0535 08/09/20 0726 08/10/20 0653 08/11/20 0542  NA 136 138 140 139 137  K 3.6 3.7 3.6 3.6 3.2*  CL 99 105 110 110 110  CO2 '25 22 23 ' 20* 20*  GLUCOSE 148* 144* 99 78 122*  BUN 32* 31* 28* 23 18  CREATININE 1.52* 1.34* 1.29* 1.19 0.84  CALCIUM 9.6 8.6* 8.1* 7.8* 7.8*   GFR: Estimated Creatinine Clearance: 41.3 mL/min (by C-G formula based on SCr of 0.84 mg/dL). Liver Function Tests: Recent Labs  Lab 08/06/20 1552 08/07/20 1608  AST 26 33  ALT 22 24   ALKPHOS 96 67  BILITOT 0.5 0.6  PROT 7.6 8.4*  ALBUMIN 4.5 4.0   Recent Labs  Lab 08/07/20 1608  LIPASE 30   No results for input(s): AMMONIA in the last 168 hours. Coagulation Profile: Recent Labs  Lab 08/07/20 1608  INR 1.1   Cardiac Enzymes: No results for input(s): CKTOTAL, CKMB, CKMBINDEX, TROPONINI in the last 168 hours. BNP (last 3 results) No results for input(s): PROBNP in the last 8760 hours. HbA1C: No results for input(s): HGBA1C in the last 72 hours. CBG: Recent Labs  Lab 08/10/20 0604 08/10/20 1224 08/10/20 1740 08/11/20 0546 08/11/20 1234  GLUCAP 73 90 93 118* 116*   Lipid Profile: No results for input(s): CHOL, HDL, LDLCALC, TRIG, CHOLHDL, LDLDIRECT in the last 72 hours. Thyroid Function Tests: No results for input(s): TSH, T4TOTAL, FREET4, T3FREE, THYROIDAB in the last 72 hours. Anemia Panel: Recent Labs    08/10/20 0653  VITAMINB12 509  FOLATE 45.8  FERRITIN 338*  TIBC 210*  IRON 37*   Sepsis Labs: Recent Labs  Lab 08/07/20 1608  LATICACIDVEN 1.5    Recent Results (from the past 240 hour(s))  Culture, Group A Strep     Status: None   Collection Time: 08/06/20  3:27 PM   Specimen: Throat  Result Value Ref Range Status   MICRO NUMBER: [id number]  Final   SPECIMEN QUALITY: Adequate  Final   SOURCE: THROAT  Final   STATUS: FINAL  Final   RESULT: No group A Streptococcus isolated  Final  Blood Culture (routine x 2)     Status: None (Preliminary result)   Collection Time: 08/07/20  4:00 PM   Specimen: BLOOD RIGHT ARM  Result Value Ref Range Status   Specimen Description   Final    BLOOD RIGHT ARM BLOOD Performed at [facility], [address] [place], [place] [address]    Special Requests   Final    Blood Culture adequate volume BOTTLES DRAWN AEROBIC AND ANAEROBIC Performed at [facility], [address] [place], [place] [address]    Culture   Final    NO GROWTH 3 DAYS Performed at [place], [place] [address], [place], [place] [address]    Report Status PENDING  Incomplete  Blood Culture (routine x 2)     Status: None (Preliminary result)   Collection Time: 08/07/20  4:05 PM   Specimen: BLOOD RIGHT HAND  Result Value Ref Range Status   Specimen Description   Final    BLOOD RIGHT HAND BLOOD Performed at [facility], [address] [place], [place] [address]    Special Requests   Final    BOTTLES DRAWN AEROBIC AND ANAEROBIC Blood Culture results may not be optimal due to an inadequate volume of blood received in culture bottles Performed at [facility], [address] [place], [place] [address]    Culture   Final    NO GROWTH 3 DAYS Performed at [facility], [place] [address], [place], [place] [address]    Report Status PENDING  Incomplete  Resp Panel by RT-PCR (Flu A&B, Covid) Peripheral     Status: None   Collection Time: 08/07/20  4:17 PM   Specimen: Peripheral; Nasopharyngeal(NP) swabs in vial transport medium  Result Value Ref Range Status   SARS Coronavirus 2 by RT PCR NEGATIVE NEGATIVE Final    Comment: (NOTE) SARS-CoV-2 target nucleic acids are NOT DETECTED.  The SARS-CoV-2 RNA is generally detectable in upper respiratory specimens during the acute phase of infection. The lowest concentration of SARS-CoV-2 viral copies this assay can detect is 138 copies/mL. A negative result does not preclude SARS-Cov-2 infection and should not be used as the sole basis for treatment or other patient management decisions. A negative result may occur with  improper specimen collection/handling, submission of specimen other than nasopharyngeal swab, presence of viral mutation(s) within the areas targeted by this assay, and inadequate number of viral copies(<138 copies/mL). A negative result must be combined with clinical observations, patient history, and epidemiological information. The expected result is Negative.  Fact Sheet for Patients:   EntrepreneurPulse.com.au  Fact Sheet for Healthcare Providers:  IncredibleEmployment.be  This test is no t yet approved or cleared by the [place] FDA and  has been authorized for detection and/or diagnosis of SARS-CoV-2 by FDA under an Emergency Use Authorization (EUA). This EUA will remain  in effect (meaning this test can be  used) for the duration of the COVID-19 declaration under Section 564(b)(1) of the Act, 21 U.S.C.section 360bbb-3(b)(1), unless the authorization is terminated  or revoked sooner.       Influenza A by PCR NEGATIVE NEGATIVE Final   Influenza B by PCR NEGATIVE NEGATIVE Final    Comment: (NOTE) The Xpert Xpress SARS-CoV-2/FLU/RSV plus assay is intended as an aid in the diagnosis of influenza from Nasopharyngeal swab specimens and should not be used as a sole basis for treatment. Nasal washings and aspirates are unacceptable for Xpert Xpress SARS-CoV-2/FLU/RSV testing.  Fact Sheet for Patients: EntrepreneurPulse.com.au  Fact Sheet for Healthcare Providers: IncredibleEmployment.be  This test is not yet approved or cleared by the [place] FDA and has been authorized for detection and/or diagnosis of SARS-CoV-2 by FDA under an Emergency Use Authorization (EUA). This EUA will remain in effect (meaning this test can be used) for the duration of the COVID-19 declaration under Section 564(b)(1) of the Act, 21 U.S.C. section 360bbb-3(b)(1), unless the authorization is terminated or revoked.  Performed at [facility], [address], [place], [place] [address]          Radiology Studies: No results found.      Scheduled Meds: . chlorhexidine  5 mL Mouth/Throat QID  . diphenhydrAMINE  25 mg Intravenous Once  . levothyroxine  50 mcg Intravenous Daily  . magic mouthwash w/lidocaine  5 mL Oral QID  . nystatin  5 mL Oral QID   Continuous Infusions: .  cefTRIAXone (ROCEPHIN)  IV Stopped (08/10/20 1744)  . dextrose 5 % and 0.45% NaCl 75 mL/hr at 08/11/20 1224  . folic acid (FOLVITE) IVPB 4 mg (08/11/20 0941)     LOS: 3 days   Georgette Shell, MD  08/11/2020, 1:20 PM

## 2020-08-12 DIAGNOSIS — K121 Other forms of stomatitis: Secondary | ICD-10-CM | POA: Diagnosis not present

## 2020-08-12 LAB — CBC WITH DIFFERENTIAL/PLATELET
Abs Immature Granulocytes: 0.01 10*3/uL (ref 0.00–0.07)
Basophils Absolute: 0 10*3/uL (ref 0.0–0.1)
Basophils Relative: 0 %
Eosinophils Absolute: 0.2 10*3/uL (ref 0.0–0.5)
Eosinophils Relative: 7 %
HCT: 27.9 % — ABNORMAL LOW (ref 39.0–52.0)
Hemoglobin: 9.4 g/dL — ABNORMAL LOW (ref 13.0–17.0)
Immature Granulocytes: 0 %
Lymphocytes Relative: 51 %
Lymphs Abs: 1.1 10*3/uL (ref 0.7–4.0)
MCH: 29.7 pg (ref 26.0–34.0)
MCHC: 33.7 g/dL (ref 30.0–36.0)
MCV: 88 fL (ref 80.0–100.0)
Monocytes Absolute: 0.2 10*3/uL (ref 0.1–1.0)
Monocytes Relative: 9 %
Neutro Abs: 0.7 10*3/uL — ABNORMAL LOW (ref 1.7–7.7)
Neutrophils Relative %: 33 %
Platelets: 87 10*3/uL — ABNORMAL LOW (ref 150–400)
RBC: 3.17 MIL/uL — ABNORMAL LOW (ref 4.22–5.81)
RDW: 11.7 % (ref 11.5–15.5)
WBC: 2.2 10*3/uL — ABNORMAL LOW (ref 4.0–10.5)
nRBC: 0 % (ref 0.0–0.2)

## 2020-08-12 LAB — BASIC METABOLIC PANEL
Anion gap: 6 (ref 5–15)
BUN: 13 mg/dL (ref 8–23)
CO2: 21 mmol/L — ABNORMAL LOW (ref 22–32)
Calcium: 7.8 mg/dL — ABNORMAL LOW (ref 8.9–10.3)
Chloride: 112 mmol/L — ABNORMAL HIGH (ref 98–111)
Creatinine, Ser: 1.24 mg/dL (ref 0.61–1.24)
GFR, Estimated: 55 mL/min — ABNORMAL LOW (ref >60)
Glucose, Bld: 112 mg/dL — ABNORMAL HIGH (ref 70–99)
Potassium: 3.5 mmol/L (ref 3.5–5.1)
Sodium: 139 mmol/L (ref 135–145)

## 2020-08-12 LAB — GLUCOSE, CAPILLARY
Glucose-Capillary: 107 mg/dL — ABNORMAL HIGH (ref 70–99)
Glucose-Capillary: 88 mg/dL (ref 70–99)
Glucose-Capillary: 91 mg/dL (ref 70–99)

## 2020-08-12 MED ORDER — LORATADINE 10 MG PO TABS
10.0000 mg | ORAL_TABLET | Freq: Every day | ORAL | Status: DC
  Administered 2020-08-12 – 2020-08-13 (×2): 10 mg via ORAL
  Filled 2020-08-12 (×2): qty 1

## 2020-08-12 MED ORDER — BACITRACIN 500 UNIT/GM EX OINT
TOPICAL_OINTMENT | Freq: Two times a day (BID) | CUTANEOUS | Status: DC
  Filled 2020-08-12: qty 14

## 2020-08-12 MED ORDER — FLUTICASONE PROPIONATE 50 MCG/ACT NA SUSP
1.0000 | Freq: Every day | NASAL | Status: DC
  Administered 2020-08-12 – 2020-08-13 (×2): 1 via NASAL
  Filled 2020-08-12: qty 16

## 2020-08-12 MED ORDER — LEVOTHYROXINE SODIUM 100 MCG PO TABS
100.0000 ug | ORAL_TABLET | Freq: Every day | ORAL | Status: DC
  Administered 2020-08-12 – 2020-08-13 (×2): 100 ug via ORAL
  Filled 2020-08-12 (×2): qty 1

## 2020-08-12 MED ORDER — TRAZODONE HCL 50 MG PO TABS
50.0000 mg | ORAL_TABLET | Freq: Every day | ORAL | Status: DC
  Administered 2020-08-12: 50 mg via ORAL
  Filled 2020-08-12: qty 1

## 2020-08-12 MED ORDER — POLYVINYL ALCOHOL 1.4 % OP SOLN: 1.0000 [drp] | Freq: Every day | OPHTHALMIC | Status: DC | PRN

## 2020-08-12 MED ORDER — DOCUSATE SODIUM 50 MG/5ML PO LIQD
200.0000 mg | Freq: Two times a day (BID) | ORAL | Status: DC
  Administered 2020-08-12 – 2020-08-13 (×3): 200 mg via ORAL
  Filled 2020-08-12 (×3): qty 20

## 2020-08-12 MED ORDER — MIRABEGRON ER 25 MG PO TB24
25.0000 mg | ORAL_TABLET | Freq: Every day | ORAL | Status: DC
  Administered 2020-08-12: 25 mg via ORAL
  Filled 2020-08-12: qty 1

## 2020-08-12 NOTE — Consult Note (Addendum)
[facility] Nurse Consult Note: Reason for Consult: Consult requested for penis.  Pt was previously wearing a Primofit urinary containment device which has been removed, and developed a partial thickness abrasion to the posterior tip of his penis.  .5X.5X.1cm, red, moist, painful to the touch. Pressure Injury POA: This is not a pressure injury; appearance is consistent with a medical adhesive related skin injury.  Dressing procedure/placement/frequency: Topical treatment orders provided for bedside nurses to perform as follows to promote healing: Apply Bacitracin to posterior tip of penis BID.  Leave Purewick off, since it will further irritate the site. Please re-consult if further assistance is needed.  Thank-you,  Julien Girt MSN, [place], [place], [place], [place]

## 2020-08-12 NOTE — Progress Notes (Signed)
Occupational Therapy Progress Note  Patient reports feeling better today, shows his mouth which looks more healed than previous session. Patient needing mod A to sit upright from semi-supine and increased time to scoot to edge of bed. Patient needing min A to stand and transfer to recliner with walker, posterior loss of balance when backing up to chair. Seated patient participate in UB/LB bathing, min A in standing to wash perianal area.     08/12/20 1221  OT Visit Information  Last OT Received On 08/12/20  Assistance Needed +1  History of Present Illness 87 y.o. male with medical history significant for hypertension, CKD stage IIIa, colon cancer status post hemicolectomy and chemotherapy, hypothyroidism, memory loss, and rheumatoid arthritis, now presenting to the emergency department for evaluation of mouth and throat pain, cough, and shortness of breath. Dx of stomatitis, respiratory failure, SIRS.  Precautions  Precautions Fall  Precaution Comments 1 fall in the past 6 months  Pain Assessment  Pain Assessment No/denies pain  Cognition  Arousal/Alertness Awake/alert  Behavior During Therapy WFL for tasks assessed/performed  Overall Cognitive Status Within Functional Limits for tasks assessed  ADL  Overall ADL's  Needs assistance/impaired  Grooming Wash/dry face;Set up;Sitting  Upper Body Bathing Set up;Sitting  Lower Body Bathing Minimal assistance;Sitting/lateral leans;Sit to/from stand  Lower Body Bathing Details (indicate cue type and reason) min A for standing balance to wash perianal area  Toilet Transfer Minimal assistance;Stand-pivot;Cueing for sequencing;RW  Toilet Transfer Details (indicate cue type and reason) to recliner, patient did have posterior loss of balance backing up to recliner needing min A for stability. cues for hand placement with sit <> stand  Functional mobility during ADLs Minimal assistance;Rolling walker;Cueing for safety  General ADL Comments patient  reports feeling much better today  Bed Mobility  Overal bed mobility Needs Assistance  Bed Mobility Supine to Sit  Supine to sit Mod assist  General bed mobility comments mod A to elevate trunk to sitting  Balance  Overall balance assessment History of Falls;Needs assistance  Sitting-balance support Feet supported  Sitting balance-Leahy Scale Good  Standing balance support Bilateral upper extremity supported  Standing balance-Leahy Scale Poor  Transfers  Overall transfer level Needs assistance  Equipment used Rolling walker (2 wheeled)  Transfers Sit to/from Stand  Sit to Stand Min assist  General transfer comment verbal cues for hand placement, min A backing up to chair due to posterior loss of balance  OT - End of Session  Equipment Utilized During Treatment Rolling walker  Activity Tolerance Patient tolerated treatment well  Patient left in chair;with call bell/phone within reach;with chair alarm set  Nurse Communication Mobility status  OT Assessment/Plan  OT Plan Discharge plan remains appropriate  OT Visit Diagnosis Other abnormalities of gait and mobility (R26.89);Muscle weakness (generalized) (M62.81);History of falling (Z91.81)  OT Frequency (ACUTE ONLY) Min 2X/week  Follow Up Recommendations Home health OT;Supervision/Assistance - 24 hour  OT Equipment None recommended by OT  AM-PAC OT "6 Clicks" Daily Activity Outcome Measure (Version 2)  Help from another person eating meals? 3  Help from another person taking care of personal grooming? 3  Help from another person toileting, which includes using toliet, bedpan, or urinal? 3  Help from another person bathing (including washing, rinsing, drying)? 3  Help from another person to put on and taking off regular upper body clothing? 3  Help from another person to put on and taking off regular lower body clothing? 3  6 Click Score 18  OT Goal Progression  Progress towards OT goals Progressing toward goals  Acute Rehab OT  Goals  Patient Stated Goal help with housework, gardening  OT Goal Formulation With patient/family  Time For Goal Achievement 08/24/20  Potential to Achieve Goals Good  ADL Goals  Pt Will Perform Lower Body Dressing with supervision;with adaptive equipment;sit to/from stand;sitting/lateral leans  Pt Will Transfer to Toilet with supervision;ambulating;bedside commode (AD as needed)  Pt Will Perform Toileting - Clothing Manipulation and hygiene with supervision;sit to/from stand;sitting/lateral leans  Additional ADL Goal #1 Patient will tolerate 10 min standing activity at supervision level in order to participate in daily routine.  OT Time Calculation  OT Start Time (ACUTE ONLY) 1037  OT Stop Time (ACUTE ONLY) 1100  OT Time Calculation (min) 23 min  OT General Charges  $OT Visit 1 Visit  OT Treatments  $Self Care/Home Management  23-37 mins   Delbert Phenix OT OT pager: [phone]

## 2020-08-12 NOTE — Progress Notes (Signed)
Physical Therapy Treatment Patient Details Name: [name] MRN: [id number] DOB: 10-02-1928 Today's Date: 08/12/2020    History of Present Illness 87 y.o. male with medical history significant for hypertension, CKD stage IIIa, colon cancer status post hemicolectomy and chemotherapy, hypothyroidism, memory loss, and rheumatoid arthritis, now presenting to the emergency department for evaluation of mouth and throat pain, cough, and shortness of breath. Dx of stomatitis, respiratory failure, SIRS.    PT Comments    Pt is progressing well with mobility, he tolerated increased ambulation distance of 260' with RW, no loss of balance. Instructed pt in seated BLE strengthening exercises. Pt reports he is feeling better overall.   Follow Up Recommendations  Home health PT     Equipment Recommendations  None recommended by PT    Recommendations for Other Services       Precautions / Restrictions Precautions Precautions: Fall Precaution Comments: 1 fall in the past 6 months Restrictions Weight Bearing Restrictions: No    Mobility  Bed Mobility               General bed mobility comments: up in recliner at start of session    Transfers Overall transfer level: Needs assistance Equipment used: Rolling walker (2 wheeled) Transfers: Sit to/from Stand Sit to Stand: Supervision         General transfer comment: VCs for hand placement  Ambulation/Gait Ambulation/Gait assistance: Supervision Gait Distance (Feet): 260 Feet Assistive device: Rolling walker (2 wheeled) Gait Pattern/deviations: Step-through pattern Gait velocity: WFL   General Gait Details: no loss of balance, VCs for positioning in RW   Stairs             Wheelchair Mobility    Modified Rankin (Stroke Patients Only)       Balance Overall balance assessment: History of Falls;Needs assistance Sitting-balance support: Feet supported Sitting balance-Leahy Scale: Good     Standing balance  support: Bilateral upper extremity supported Standing balance-Leahy Scale: Poor                              Cognition Arousal/Alertness: Awake/alert Behavior During Therapy: WFL for tasks assessed/performed Overall Cognitive Status: Within Functional Limits for tasks assessed                                        Exercises General Exercises - Upper Extremity Shoulder Flexion: AROM;Both;10 reps;Seated General Exercises - Lower Extremity Ankle Circles/Pumps: AROM;Both;10 reps;Seated Long Arc Quad: AROM;Both;10 reps;Seated Hip Flexion/Marching: AROM;Both;10 reps;Seated    General Comments        Pertinent Vitals/Pain Pain Assessment: No/denies pain    Home Living                      Prior Function            PT Goals (current goals can now be found in the care plan section) Acute Rehab PT Goals Patient Stated Goal: help with housework, gardening PT Goal Formulation: With patient/family Time For Goal Achievement: 08/24/20 Potential to Achieve Goals: Good Progress towards PT goals: Progressing toward goals    Frequency    Min 3X/week      PT Plan Current plan remains appropriate    Co-evaluation              AM-PAC PT "6 Clicks" Mobility   Outcome Measure  Help needed turning from your back to your side while in a flat bed without using bedrails?: A Little Help needed moving from lying on your back to sitting on the side of a flat bed without using bedrails?: A Little Help needed moving to and from a bed to a chair (including a wheelchair)?: A Little Help needed standing up from a chair using your arms (e.g., wheelchair or bedside chair)?: A Little Help needed to walk in hospital room?: A Little Help needed climbing 3-5 steps with a railing? : A Little 6 Click Score: 18    End of Session Equipment Utilized During Treatment: Gait belt Activity Tolerance: Patient tolerated treatment well Patient left: in  chair;with call bell/phone within reach;with chair alarm set Nurse Communication: Mobility status PT Visit Diagnosis: Difficulty in walking, not elsewhere classified (R26.2)     Time: 8119-1478 PT Time Calculation (min) (ACUTE ONLY): 15 min  Charges:  $Gait Training: 8-22 mins                     Blondell Reveal Kistler PT 08/12/2020  Acute Rehabilitation Services Pager [phone] Office [phone]

## 2020-08-12 NOTE — Progress Notes (Signed)
PROGRESS NOTE    [name]  KDX:[id number] DOB: [birth year] DOA: 08/07/2020 PCP: [name], DO    Brief Narrative:HPI from Dr Wilmon Arms Hois a 87 y.o.malewith medical history significant forhypertension, CKD stage IIIa, colon cancer status post hemicolectomy and chemotherapy, hypothyroidism, memory loss, and rheumatoid arthritis, now presenting to the emergency department for evaluation of mouth and throat pain, cough, and shortness of breath. Patient was previously treated with methotrexate for his rheumatoid arthritis (noted mild reaction), but had been off of that for several years until resuming on 08/01/2020.1-2 days later he began to develop sore throat and sore mouth which has progressively worsened to the point where he is unable to eat or drink due to pain. He has also developed a productive cough and shortness of breath over the past couple days. He was evaluated by his PCP for the symptoms and started on azithromycin. In the ED, pt found to have a temperature 37.8C, saturating low 90s on 4 L/min of O2, slightly tachypneic, and with stable blood pressure. EKG features sinus rhythm and chest x-rays negative for acute cardiopulmonary disease. Lactic acid is normal and COVID-19 screening test negative. Cultures collected in the emergency department and the patient was treated with IV fluids and Rocephin.  Patient admitted for further management.   Assessment & Plan:   Principal Problem:   Ulcerative stomatitis Active Problems:   Essential hypertension   Rheumatoid arthritis ([place])   Chronic kidney disease, stage 3a (HCC)   Leukopenia   Acute respiratory failure with hypoxia (HCC)   SIRS (systemic inflammatory response syndrome) (HCC)   Methotrexate adverse reaction, initial encounter    1 methotrexate induced stomatitis patient admitted with mouth pain throat pain with oral lesions.  He is being treated symptomatically with morphine and Magic mouthwash and  high-dose folic acid.  Discussed in detail with patient's daughter who was in the room.  He is able to tolerate chlorhexidine mouthwash and full liquid diet.   We will try to advance diet as tolerated however discussed with patient and daughter regarding placement of an NG tube temporarily for nutrition.  2 status post acute hypoxic respiratory failure resolved.  Question pneumonia question aspiration however he is on room air now.  On Rocephin azithromycin.  3 SIRS patient met criteria for SIRS on admission with tachypnea and leukopenia.  On Rocephin azithromycin for possible pneumonia finish the course of 5 days and stop.  4 pancytopenia likely induced by methotrexate continue current management.  5 rheumatoid arthritis followed by [facility] rheumatologist.  6 hypothyroidism continue Synthroid IV.  7 AKI on CKD stage IIIa baseline creatinine around 1.2  HCTZ and losartan on hold due to soft blood pressure.  8 hypokalemia-potassium 3.5 after repletion.  Magnesium 2.1.    9 BPH continue home meds Myrbetriq follow-up with [facility] after discharge.  Avoid constipation.  Check bladder scan.  10 insomnia we will try trazodone 50 mg nightly.  11 seasonal allergies patient takes Allegra at home restart and will try Flonase nasal spray.  He has taken Flonase in the past however this stopped giving it to him due to nosebleed.   Estimated body mass index is 24.41 kg/m as calculated from the following:   Height as of this encounter: 5' (1.524 m).   Weight as of this encounter: 56.7 kg.  DVT prophylaxis: SCD  code Status: Full code Family Communication: Discussed with daughter at bedside  disposition Plan:  Status is: Inpatient  Dispo: The patient is from:  Home              Anticipated d/c is to: Home              Patient currently is not medically stable to d/c.   Difficult to place patient not applicable   Consultants:   None  Procedures: None Antimicrobials:  None  Subjective: Is resting in bed complains of pain in the mouth multiple ulcerations noted under the tongue Had a rough night overnight did not sleep pulled out external urinary catheter now with some aberration on the penis complaining of incontinence of urine which is chronic tolerated clear liquids without any nausea vomiting daughter reports that he is having some cough mainly at night denies shortness of breath has history of seasonal allergies.  Objective: Vitals:   08/11/20 0458 08/11/20 1449 08/11/20 2039 08/12/20 0545  BP: 132/64 131/66 117/62 114/61  Pulse: 64 61 62 62  Resp: _0 Temp: 97.6 F (36.4 C) 98.2 F (36.8 C) 98.6 F (37 C) 98.2 F (36.8 C)  TempSrc: Oral Oral Oral Oral  SpO2: 97% 98% 96% 96%  Weight:      Height:        Intake/Output Summary (Last 24 hours) at 08/12/2020 1015 Last data filed at 08/12/2020 0900 Gross per 24 hour  Intake 2362.6 ml  Output 1300 ml  Net 1062.6 ml   Filed Weights   08/07/20 1541 08/07/20 2048 08/09/20 0709  Weight: 61.2 kg 54.1 kg 56.7 kg    Examination:  General exam: Appears in mild distress due to pain in the throat mouth and penis. Respiratory system: Clear to auscultation. Respiratory effort normal. Cardiovascular system: S1 & S2 heard, RRR. No JVD, murmurs, rubs, gallops or clicks. No pedal edema. Gastrointestinal system: Abdomen is nondistended, soft and nontender. No organomegaly or masses felt. Normal bowel sounds heard. Central nervous system: Alert and oriented. No focal neurological deficits. Extremities: Symmetric 5 x 5 power. Skin: No rashes, lesions or ulcers Psychiatry: Judgement and insight appear normal. Mood & affect appropriate.     Data Reviewed: I have personally reviewed following labs and imaging studies  CBC: Recent Labs  Lab 08/08/20 0535 08/09/20 0726 08/10/20 0653 08/11/20 0542 08/12/20 0542  WBC 3.8* 3.3* 3.3* 2.2* 2.2*  NEUTROABS 3.0 2.1 1.8 1.0* 0.7*  HGB 10.6* 9.7*  8.8* 9.2* 9.4*  HCT 31.8* 29.6* 27.6* 28.2* 27.9*  MCV 90.9 92.2 93.9 90.7 88.0  PLT 125* 93* 86* 80* 87*   Basic Metabolic Panel: Recent Labs  Lab 08/08/20 0535 08/09/20 0726 08/10/20 0653 08/11/20 0541 08/11/20 0542 08/12/20 0542  NA 138 140 139  --  137 139  K 3.7 3.6 3.6  --  3.2* 3.5  CL 105 110 110  --  110 112*  CO2 22 23 20*  --  20* 21*  GLUCOSE 144* 99 78  --  122* 112*  BUN 31* 28* 23  --  18 13  CREATININE 1.34* 1.29* 1.19  --  0.84 1.24  CALCIUM 8.6* 8.1* 7.8*  --  7.8* 7.8*  MG  --   --   --  2.1  --   --    GFR: Estimated Creatinine Clearance: 28 mL/min (by C-G formula based on SCr of 1.24 mg/dL). Liver Function Tests: Recent Labs  Lab 08/06/20 1552 08/07/20 1608  AST 26 33  ALT 22 24  ALKPHOS 96 67  BILITOT 0.5 0.6  PROT 7.6 8.4*  ALBUMIN 4.5 4.0  Recent Labs  Lab 08/07/20 1608  LIPASE 30   No results for input(s): AMMONIA in the last 168 hours. Coagulation Profile: Recent Labs  Lab 08/07/20 1608  INR 1.1   Cardiac Enzymes: No results for input(s): CKTOTAL, CKMB, CKMBINDEX, TROPONINI in the last 168 hours. BNP (last 3 results) No results for input(s): PROBNP in the last 8760 hours. HbA1C: No results for input(s): HGBA1C in the last 72 hours. CBG: Recent Labs  Lab 08/11/20 0546 08/11/20 1234 08/11/20 1647 08/11/20 2041 08/12/20 0542  GLUCAP 118* 116* 160* 94 107*   Lipid Profile: No results for input(s): CHOL, HDL, LDLCALC, TRIG, CHOLHDL, LDLDIRECT in the last 72 hours. Thyroid Function Tests: No results for input(s): TSH, T4TOTAL, FREET4, T3FREE, THYROIDAB in the last 72 hours. Anemia Panel: Recent Labs    08/10/20 0653  VITAMINB12 509  FOLATE 45.8  FERRITIN 338*  TIBC 210*  IRON 37*   Sepsis Labs: Recent Labs  Lab 08/07/20 1608  LATICACIDVEN 1.5    Recent Results (from the past 240 hour(s))  Culture, Group A Strep     Status: None   Collection Time: 08/06/20  3:27 PM   Specimen: Throat  Result Value Ref Range  Status   MICRO NUMBER: [id number]  Final   SPECIMEN QUALITY: Adequate  Final   SOURCE: THROAT  Final   STATUS: FINAL  Final   RESULT: No group A Streptococcus isolated  Final  Blood Culture (routine x 2)     Status: None (Preliminary result)   Collection Time: 08/07/20  4:00 PM   Specimen: BLOOD RIGHT ARM  Result Value Ref Range Status   Specimen Description   Final    BLOOD RIGHT ARM BLOOD Performed at [facility], [address] [place], [place] [address]    Special Requests   Final    Blood Culture adequate volume BOTTLES DRAWN AEROBIC AND ANAEROBIC Performed at [facility], [address] [place], [place] [address]    Culture   Final    NO GROWTH 4 DAYS Performed at [facility], [place] [address] [place], [place] [address]    Report Status PENDING  Incomplete  Blood Culture (routine x 2)     Status: None (Preliminary result)   Collection Time: 08/07/20  4:05 PM   Specimen: BLOOD RIGHT HAND  Result Value Ref Range Status   Specimen Description   Final    BLOOD RIGHT HAND BLOOD Performed at [facility], [address] [place], [place] [address]    Special Requests   Final    BOTTLES DRAWN AEROBIC AND ANAEROBIC Blood Culture results may not be optimal due to an inadequate volume of blood received in culture bottles Performed at [facility], [address] [place], [place] [address]    Culture   Final    NO GROWTH 4 DAYS Performed at [facility], [place] [address], [place], [place] [address]    Report Status PENDING  Incomplete  Resp Panel by RT-PCR (Flu A&B, Covid) Peripheral     Status: None   Collection Time: 08/07/20  4:17 PM   Specimen: Peripheral; Nasopharyngeal(NP) swabs in vial transport medium  Result Value Ref Range Status   SARS Coronavirus 2 by RT PCR NEGATIVE NEGATIVE Final    Comment: (NOTE) SARS-CoV-2 target nucleic acids are NOT DETECTED.  The SARS-CoV-2 RNA is generally detectable in upper  respiratory specimens during the acute phase of  infection. The lowest concentration of SARS-CoV-2 viral copies this assay can detect is 138 copies/mL. A negative result does not preclude SARS-Cov-2 infection and should not be used as the sole basis for treatment or other patient management decisions. A negative result may occur with  improper specimen collection/handling, submission of specimen other than nasopharyngeal swab, presence of viral mutation(s) within the areas targeted by this assay, and inadequate number of viral copies(<138 copies/mL). A negative result must be combined with clinical observations, patient history, and epidemiological information. The expected result is Negative.  Fact Sheet for Patients:  EntrepreneurPulse.com.au  Fact Sheet for Healthcare Providers:  IncredibleEmployment.be  This test is no t yet approved or cleared by the [place] FDA and  has been authorized for detection and/or diagnosis of SARS-CoV-2 by FDA under an Emergency Use Authorization (EUA). This EUA will remain  in effect (meaning this test can be used) for the duration of the COVID-19 declaration under Section 564(b)(1) of the Act, 21 U.S.C.section 360bbb-3(b)(1), unless the authorization is terminated  or revoked sooner.       Influenza A by PCR NEGATIVE NEGATIVE Final   Influenza B by PCR NEGATIVE NEGATIVE Final    Comment: (NOTE) The Xpert Xpress SARS-CoV-2/FLU/RSV plus assay is intended as an aid in the diagnosis of influenza from Nasopharyngeal swab specimens and should not be used as a sole basis for treatment. Nasal washings and aspirates are unacceptable for Xpert Xpress SARS-CoV-2/FLU/RSV testing.  Fact Sheet for Patients: EntrepreneurPulse.com.au  Fact Sheet for Healthcare Providers: IncredibleEmployment.be  This test is not yet approved or cleared by the [place] FDA and has been  authorized for detection and/or diagnosis of SARS-CoV-2 by FDA under an Emergency Use Authorization (EUA). This EUA will remain in effect (meaning this test can be used) for the duration of the COVID-19 declaration under Section 564(b)(1) of the Act, 21 U.S.C. section 360bbb-3(b)(1), unless the authorization is terminated or revoked.  Performed at [facility], [address], [place], [place] [address]          Radiology Studies: No results found.      Scheduled Meds: . chlorhexidine  5 mL Mouth/Throat QID  . fluticasone  1 spray Each Nare Daily  . levothyroxine  100 mcg Oral QAC breakfast  . loratadine  10 mg Oral Daily  . magic mouthwash w/lidocaine  5 mL Oral QID  . mirabegron ER  25 mg Oral QHS  . nystatin  5 mL Oral QID  . traZODone  50 mg Oral QHS   Continuous Infusions: . dextrose 5 % and 0.45% NaCl 75 mL/hr at 08/12/20 0429  . folic acid (FOLVITE) IVPB 4 mg (08/11/20 0941)     LOS: 4 days   Georgette Shell, MD  08/12/2020, 10:15 AM

## 2020-08-13 DIAGNOSIS — K121 Other forms of stomatitis: Secondary | ICD-10-CM | POA: Diagnosis not present

## 2020-08-13 LAB — BASIC METABOLIC PANEL
Anion gap: 7 (ref 5–15)
BUN: 9 mg/dL (ref 8–23)
CO2: 20 mmol/L — ABNORMAL LOW (ref 22–32)
Calcium: 8.3 mg/dL — ABNORMAL LOW (ref 8.9–10.3)
Chloride: 110 mmol/L (ref 98–111)
Creatinine, Ser: 1.08 mg/dL (ref 0.61–1.24)
GFR, Estimated: >60 mL/min (ref >60)
Glucose, Bld: 108 mg/dL — ABNORMAL HIGH (ref 70–99)
Potassium: 3.7 mmol/L (ref 3.5–5.1)
Sodium: 137 mmol/L (ref 135–145)

## 2020-08-13 LAB — CBC WITH DIFFERENTIAL/PLATELET
Abs Immature Granulocytes: 0 10*3/uL (ref 0.00–0.07)
Basophils Absolute: 0 10*3/uL (ref 0.0–0.1)
Basophils Relative: 0 %
Eosinophils Absolute: 0.1 10*3/uL (ref 0.0–0.5)
Eosinophils Relative: 5 %
HCT: 32.6 % — ABNORMAL LOW (ref 39.0–52.0)
Hemoglobin: 10.9 g/dL — ABNORMAL LOW (ref 13.0–17.0)
Immature Granulocytes: 0 %
Lymphocytes Relative: 47 %
Lymphs Abs: 1.2 10*3/uL (ref 0.7–4.0)
MCH: 29.9 pg (ref 26.0–34.0)
MCHC: 33.4 g/dL (ref 30.0–36.0)
MCV: 89.3 fL (ref 80.0–100.0)
Monocytes Absolute: 0.2 10*3/uL (ref 0.1–1.0)
Monocytes Relative: 9 %
Neutro Abs: 1 10*3/uL — ABNORMAL LOW (ref 1.7–7.7)
Neutrophils Relative %: 39 %
Platelets: 113 10*3/uL — ABNORMAL LOW (ref 150–400)
RBC: 3.65 MIL/uL — ABNORMAL LOW (ref 4.22–5.81)
RDW: 11.9 % (ref 11.5–15.5)
WBC: 2.5 10*3/uL — ABNORMAL LOW (ref 4.0–10.5)
nRBC: 0 % (ref 0.0–0.2)

## 2020-08-13 LAB — CULTURE, BLOOD (ROUTINE X 2)
Culture: NO GROWTH
Culture: NO GROWTH
Special Requests: ADEQUATE

## 2020-08-13 LAB — GLUCOSE, CAPILLARY
Glucose-Capillary: 106 mg/dL — ABNORMAL HIGH (ref 70–99)
Glucose-Capillary: 107 mg/dL — ABNORMAL HIGH (ref 70–99)

## 2020-08-13 MED ORDER — LIP MEDEX EX OINT
TOPICAL_OINTMENT | CUTANEOUS | Status: AC
  Administered 2020-08-13: 1
  Filled 2020-08-13: qty 7

## 2020-08-13 MED ORDER — BACITRACIN 500 UNIT/GM EX OINT: TOPICAL_OINTMENT | Freq: Two times a day (BID) | CUTANEOUS | 0 refills | Status: DC

## 2020-08-13 MED ORDER — CHLORHEXIDINE GLUCONATE 0.12 % MT SOLN: 10.0000 mL | Freq: Four times a day (QID) | OROMUCOSAL | 2 refills | Status: DC

## 2020-08-13 MED ORDER — TRAZODONE HCL 50 MG PO TABS: 50.0000 mg | ORAL_TABLET | Freq: Every day | ORAL | 0 refills | Status: DC

## 2020-08-13 MED ORDER — NYSTATIN 100000 UNIT/ML MT SUSP: 5.0000 mL | Freq: Four times a day (QID) | OROMUCOSAL | 2 refills | Status: DC

## 2020-08-13 MED ORDER — TRAMADOL HCL 50 MG PO TABS: 25.0000 mg | ORAL_TABLET | Freq: Four times a day (QID) | ORAL | 0 refills | Status: DC | PRN

## 2020-08-13 MED ORDER — FOLIC ACID 1 MG PO TABS: 1.0000 mg | ORAL_TABLET | Freq: Every morning | ORAL | 6 refills | Status: DC

## 2020-08-13 NOTE — Care Management Important Message (Signed)
Important Message  Patient Details IM Letter placed in Patient's room. Name: [name] MRN: [id number] Date of Birth: 1928/11/07   Medicare Important Message Given:  Yes     Kerin Salen 08/13/2020, 10:38 AM

## 2020-08-13 NOTE — Discharge Summary (Signed)
Physician Discharge Summary  [name] FTD:[id number] DOB: [birth year] DOA: 08/07/2020  PCP: [name], DO  Admit date: 08/07/2020 Discharge date: 08/13/2020  Admitted From: Home Disposition: Home Recommendations for Outpatient Follow-up:  1. Follow up with PCP in 1-2 weeks 2. Please obtain BMP/CBC in one week  Home Health PT Equipment/Devices: None  Discharge Condition: Stable CODE STATUS: Full code Diet recommendation: Cardiac Brief/Interim Summary:87 y.o.malewith medical history significant forhypertension, CKD stage IIIa, colon cancer status post hemicolectomy and chemotherapy, hypothyroidism, memory loss, and rheumatoid arthritis, now presenting to the emergency department for evaluation of mouth and throat pain, cough, and shortness of breath. Patient was previously treated with methotrexate for his rheumatoid arthritis (noted mild reaction), but had been off of that for several years until resuming on 08/01/2020.1-2 days later he began to develop sore throat and sore mouth which has progressively worsened to the point where he is unable to eat or drink due to pain. He has also developed a productive cough and shortness of breath over the past couple days. He was evaluated by his PCP for the symptoms and started on azithromycin. In the ED, pt found to have a temperature 37.8C, saturating low 90s on 4 L/min of O2, slightly tachypneic, and with stable blood pressure. EKG features sinus rhythm and chest x-rays negative for acute cardiopulmonary disease. Lactic acid is normal and COVID-19 screening test negative. Cultures collected in the emergency department and the patient was treated with IV fluids and Rocephin. Patient admitted for further management.   Discharge Diagnoses:  Principal Problem:   Ulcerative stomatitis Active Problems:   Essential hypertension   Rheumatoid arthritis ([place])   Chronic kidney disease, stage 3a (HCC)   Leukopenia   Acute respiratory  failure with hypoxia (HCC)   SIRS (systemic inflammatory response syndrome) (HCC)   Methotrexate adverse reaction, initial encounter   1 methotrexate induced stomatitis patient admitted with mouth pain throat pain with oral lesions.  He was being treated symptomatically with morphine and Magic mouthwash chlorhexidine gluconate mouthwash and high-dose folic acid.  His mouth lesions improved significantly with the chlorhexidine mouthwash and he was able to tolerate full liquids prior to discharge.  2 status post acute hypoxic respiratory failure resolved.  He was treated with Rocephin and azithromycin.  3 SIRS patient met criteria for SIRS on admission with tachypnea and leukopenia.    Received a 5-day course of Rocephin and azithromycin.  4 pancytopenia likely induced by methotrexate.  His counts were improving at the time of discharge white count was 2.5 hemoglobin 10.9 platelet count was 113.  5 rheumatoid arthritis followed by [facility] rheumatologist.  6 hypothyroidism continue Synthroid   7 AKI on CKD stage IIIa baseline creatinine around 1.2    8 hypokalemia-resolved potassium 3.7 on discharge  9 BPH continue home meds Myrbetriq follow-up with [facility] after discharge.  Avoid constipation.  10 insomnia we will try trazodone 50 mg nightly  11 seasonal allergies continue Allegra.   Estimated body mass index is 24.41 kg/m as calculated from the following:   Height as of this encounter: 5' (1.524 m).   Weight as of this encounter: 56.7 kg.  Discharge Instructions  Discharge Instructions    Diet - low sodium heart healthy   Complete by: As directed    Discharge wound care:   Complete by: As directed    Clean the area under penis with soap and water pat dry and apply bacitracin bid   Increase activity slowly   Complete  by: As directed      Allergies as of 08/13/2020      Reactions   Methotrexate Derivatives Other (See Comments)   Mouth sores   Asa  [aspirin] Other (See Comments)   Gastric symptoms - can tolerate 81 mg aspirin   Penicillin G Rash   Did it involve swelling of the face/tongue/throat, SOB, or low BP? Unknown Did it involve sudden or severe rash/hives, skin peeling, or any reaction on the inside of your mouth or nose? Unknown Did you need to seek medical attention at a hospital or doctor's office? Unknown When did it last happen?unknown If all above answers are "NO", may proceed with cephalosporin use.      Medication List    STOP taking these medications   azithromycin 250 MG tablet Commonly known as: ZITHROMAX   methotrexate 2.5 MG tablet Commonly known as: RHEUMATREX   NONFORMULARY OR COMPOUNDED ITEM   NONFORMULARY OR COMPOUNDED ITEM     TAKE these medications   Artificial Tears 1.4 % ophthalmic solution Generic drug: polyvinyl alcohol Place 1 drop into both eyes daily as needed for dry eyes.   aspirin EC 81 MG tablet Take 81 mg by mouth daily with lunch. Swallow whole.   bacitracin 500 UNIT/GM ointment Apply topically 2 (two) times daily.   CALCIUM PO Take 1 tablet by mouth every other day.   chlorhexidine 0.12 % solution Commonly known as: PERIDEX Use as directed 10 mLs in the mouth or throat 4 (four) times daily.   docusate sodium 100 MG capsule Commonly known as: COLACE Take 200 mg by mouth daily.   folic acid 1 MG tablet Commonly known as: FOLVITE Take 1 mg by mouth every morning.   furosemide 20 MG tablet Commonly known as: LASIX Take 1 tablet (20 mg total) by mouth daily. What changed:   when to take this  reasons to take this   gemfibrozil 600 MG tablet Commonly known as: LOPID Take 1 tablet (600 mg total) by mouth daily. What changed: when to take this   guaiFENesin 600 MG 12 hr tablet Commonly known as: MUCINEX Take 600 mg by mouth every other day.   levothyroxine 100 MCG tablet Commonly known as: SYNTHROID Take 1 tablet (100 mcg total) by mouth daily before  breakfast.   loratadine 10 MG tablet Commonly known as: CLARITIN Take 10 mg by mouth daily.   losartan-hydrochlorothiazide 50-12.5 MG tablet Commonly known as: HYZAAR Take 1 tablet by mouth daily.   magic mouthwash (lidocaine, diphenhydrAMINE, alum & mag hydroxide) suspension 5 ml po qid swish and spit What changed:   how much to take  how to take this  when to take this  reasons to take this  additional instructions   Myrbetriq 25 MG Tb24 tablet Generic drug: mirabegron ER Take 25 mg by mouth at bedtime.   OSTEO BI-FLEX REGULAR STRENGTH PO Take 1 tablet by mouth every morning.   traMADol 50 MG tablet Commonly known as: ULTRAM Take 0.5 tablets (25 mg total) by mouth every 6 (six) hours as needed for severe pain.   traZODone 50 MG tablet Commonly known as: DESYREL Take 1 tablet (50 mg total) by mouth at bedtime.   VITAMIN D3 PO Take 1 tablet by mouth daily with lunch.            Discharge Care Instructions  (From admission, onward)         Start     Ordered   08/13/20 0000  Discharge wound  care:       Comments: Clean the area under penis with soap and water pat dry and apply bacitracin bid   08/13/20 2119          Follow-up Information    [name] R, DO Follow up.   Specialty: Family Medicine Contact information: [address] [place] [address] [phone]              Allergies  Allergen Reactions  . Methotrexate Derivatives Other (See Comments)    Mouth sores  . Asa [Aspirin] Other (See Comments)    Gastric symptoms - can tolerate 81 mg aspirin  . Penicillin G Rash    Did it involve swelling of the face/tongue/throat, SOB, or low BP? Unknown Did it involve sudden or severe rash/hives, skin peeling, or any reaction on the inside of your mouth or nose? Unknown Did you need to seek medical attention at a hospital or doctor's office? Unknown When did it last happen?unknown If all above answers are  "NO", may proceed with cephalosporin use.    Consultations:  none   Procedures/Studies: CT SOFT TISSUE NECK W CONTRAST  Result Date: 08/09/2020 CLINICAL DATA:  Chronic neck pain. Difficulty swallowing. Ulcer tip stomatitis. EXAM: CT NECK WITH CONTRAST TECHNIQUE: Multidetector CT imaging of the neck was performed using the standard protocol following the bolus administration of intravenous contrast. CONTRAST:  70m OMNIPAQUE IOHEXOL 300 MG/ML  SOLN COMPARISON:  None. FINDINGS: Pharynx and larynx: Normal. No mass or swelling. Salivary glands: No inflammation, mass, or stone. Thyroid: Hypoplastic thyroid bilaterally.  No thyroid mass. Lymph nodes: No enlarged lymph nodes in the neck. Vascular: Normal vascular enhancement. Mild atherosclerotic calcification in the carotid bifurcation bilaterally. Atherosclerotic calcification in the cavernous carotid and distal vertebral artery bilaterally. Limited intracranial: Negative Visualized orbits: No orbital lesion.  Bilateral cataract extraction Mastoids and visualized paranasal sinuses: Mucosal edema paranasal sinuses. Skeleton: Disc and facet degeneration and spurring multiple levels bilaterally. Bilateral foraminal stenosis. No acute skeletal abnormality. Upper chest: Lung apices clear bilaterally. Other: None IMPRESSION: Negative for mass or adenopathy in the neck Hypoplastic thyroid.  Correlate with thyroid function tests. Cervical spondylosis with foraminal encroachment bilaterally. Electronically Signed   By: CFranchot GalloM.D.   On: 08/09/2020 14:38   DG Chest Port 1 View  Result Date: 08/07/2020 CLINICAL DATA:  Abdominal pain and productive cough. EXAM: PORTABLE CHEST 1 VIEW COMPARISON:  Chest radiograph dated 03/09/2019. FINDINGS: The heart is borderline enlarged. Vascular calcifications are seen in the aortic arch. The lungs are clear. Degenerative changes are seen in the spine. IMPRESSION: No active cardiopulmonary disease. Aortic Atherosclerosis  (ICD10-I70.0). Electronically Signed   By: TZerita BoersM.D.   On: 08/07/2020 16:37    (Echo, Carotid, EGD, Colonoscopy, ERCP)    Subjective: Patient resting in bed anxious to go home tolerating full liquid soup brought from home  Discharge Exam: Vitals:   08/12/20 2118 08/13/20 0634  BP: 133/71 (!) 144/68  Pulse: 64 64  Resp: 14 14  Temp: 98.4 F (36.9 C) 98.7 F (37.1 C)  SpO2: 97% 99%   Vitals:   08/12/20 0545 08/12/20 1329 08/12/20 2118 08/13/20 0634  BP: 114/61 134/67 133/71 (!) 144/68  Pulse: 62 65 64 64  Resp: 14 16 14 14   Temp: 98.2 F (36.8 C) 98.6 F (37 C) 98.4 F (36.9 C) 98.7 F (37.1 C)  TempSrc: Oral Oral Oral Oral  SpO2: 96% 97% 97% 99%  Weight:  Height:        General: Pt is alert, awake, not in acute distress Cardiovascular: RRR, S1/S2 +, no rubs, no gallops Respiratory: CTA bilaterally, no wheezing, no rhonchi Abdominal: Soft, NT, ND, bowel sounds + Extremities: no edema, no cyanosis    The results of significant diagnostics from this hospitalization (including imaging, microbiology, ancillary and laboratory) are listed below for reference.     Microbiology: Recent Results (from the past 240 hour(s))  Culture, Group A Strep     Status: None   Collection Time: 08/06/20  3:27 PM   Specimen: Throat  Result Value Ref Range Status   MICRO NUMBER: [id number]  Final   SPECIMEN QUALITY: Adequate  Final   SOURCE: THROAT  Final   STATUS: FINAL  Final   RESULT: No group A Streptococcus isolated  Final  Blood Culture (routine x 2)     Status: None   Collection Time: 08/07/20  4:00 PM   Specimen: BLOOD RIGHT ARM  Result Value Ref Range Status   Specimen Description   Final    BLOOD RIGHT ARM BLOOD Performed at [facility], [address] [place], [place] [address]    Special Requests   Final    Blood Culture adequate volume BOTTLES DRAWN AEROBIC AND ANAEROBIC Performed at [facility], [address] [place], [place] [address]    Culture   Final    NO GROWTH 5 DAYS Performed at [facility], [place] [address] [place], [place] [address]    Report Status 08/13/2020 FINAL  Final  Blood Culture (routine x 2)     Status: None   Collection Time: 08/07/20  4:05 PM   Specimen: BLOOD RIGHT HAND  Result Value Ref Range Status   Specimen Description   Final    BLOOD RIGHT HAND BLOOD Performed at [facility], [address] [place], [place] [address]    Special Requests   Final    BOTTLES DRAWN AEROBIC AND ANAEROBIC Blood Culture results may not be optimal due to an inadequate volume of blood received in culture bottles Performed at [facility], [address] [place], [place] [address]    Culture   Final    NO GROWTH 5 DAYS Performed at [facility], [place] [address] [place], [place] [address]    Report Status 08/13/2020 FINAL  Final  Resp Panel by RT-PCR (Flu A&B, Covid) Peripheral     Status: None   Collection Time: 08/07/20  4:17 PM   Specimen: Peripheral; Nasopharyngeal(NP) swabs in vial transport medium  Result Value Ref Range Status   SARS Coronavirus 2 by RT PCR NEGATIVE NEGATIVE Final    Comment: (NOTE) SARS-CoV-2 target nucleic acids are NOT DETECTED.  The SARS-CoV-2 RNA is generally detectable in upper respiratory specimens during the acute phase of infection. The lowest concentration of SARS-CoV-2 viral copies this assay can detect is 138 copies/mL. A negative result does not preclude SARS-Cov-2 infection and should not be used as the sole basis for treatment or other patient management decisions. A negative result may occur with  improper specimen collection/handling, submission of specimen other than nasopharyngeal swab, presence of viral mutation(s) within the areas targeted by this assay, and inadequate number of viral copies(<138 copies/mL). A negative result must be combined with clinical observations, patient history, and  epidemiological information. The expected result is Negative.  Fact Sheet for Patients:  EntrepreneurPulse.com.au  Fact Sheet  for Healthcare Providers:  IncredibleEmployment.be  This test is no t yet approved or cleared by the [place] and  has been authorized for detection and/or diagnosis of SARS-CoV-2 by FDA under an Emergency Use Authorization (EUA). This EUA will remain  in effect (meaning this test can be used) for the duration of the COVID-19 declaration under Section 564(b)(1) of the Act, 21 U.S.C.section 360bbb-3(b)(1), unless the authorization is terminated  or revoked sooner.       Influenza A by PCR NEGATIVE NEGATIVE Final   Influenza B by PCR NEGATIVE NEGATIVE Final    Comment: (NOTE) The Xpert Xpress SARS-CoV-2/FLU/RSV plus assay is intended as an aid in the diagnosis of influenza from Nasopharyngeal swab specimens and should not be used as a sole basis for treatment. Nasal washings and aspirates are unacceptable for Xpert Xpress SARS-CoV-2/FLU/RSV testing.  Fact Sheet for Patients: EntrepreneurPulse.com.au  Fact Sheet for Healthcare Providers: IncredibleEmployment.be  This test is not yet approved or cleared by the [place] FDA and has been authorized for detection and/or diagnosis of SARS-CoV-2 by FDA under an Emergency Use Authorization (EUA). This EUA will remain in effect (meaning this test can be used) for the duration of the COVID-19 declaration under Section 564(b)(1) of the Act, 21 U.S.C. section 360bbb-3(b)(1), unless the authorization is terminated or revoked.  Performed at [facility], [address] [place], [place] [address]      Labs: BNP (last 3 results) No results for input(s): BNP in the last 8760 hours. Basic Metabolic Panel: Recent Labs  Lab 08/09/20 0726 08/10/20 0653 08/11/20 0541 08/11/20 0542 08/12/20 0542 08/13/20 0551  NA  140 139  --  137 139 137  K 3.6 3.6  --  3.2* 3.5 3.7  CL 110 110  --  110 112* 110  CO2 23 20*  --  20* 21* 20*  GLUCOSE 99 78  --  122* 112* 108*  BUN 28* 23  --  18 13 9   CREATININE 1.29* 1.19  --  0.84 1.24 1.08  CALCIUM 8.1* 7.8*  --  7.8* 7.8* 8.3*  MG  --   --  2.1  --   --   --    Liver Function Tests: Recent Labs  Lab 08/06/20 1552 08/07/20 1608  AST 26 33  ALT 22 24  ALKPHOS 96 67  BILITOT 0.5 0.6  PROT 7.6 8.4*  ALBUMIN 4.5 4.0   Recent Labs  Lab 08/07/20 1608  LIPASE 30   No results for input(s): AMMONIA in the last 168 hours. CBC: Recent Labs  Lab 08/09/20 0726 08/10/20 0653 08/11/20 0542 08/12/20 0542 08/13/20 0551  WBC 3.3* 3.3* 2.2* 2.2* 2.5*  NEUTROABS 2.1 1.8 1.0* 0.7* 1.0*  HGB 9.7* 8.8* 9.2* 9.4* 10.9*  HCT 29.6* 27.6* 28.2* 27.9* 32.6*  MCV 92.2 93.9 90.7 88.0 89.3  PLT 93* 86* 80* 87* 113*   Cardiac Enzymes: No results for input(s): CKTOTAL, CKMB, CKMBINDEX, TROPONINI in the last 168 hours. BNP: Invalid input(s): POCBNP CBG: Recent Labs  Lab 08/12/20 0542 08/12/20 1147 08/12/20 1649 08/12/20 2354 08/13/20 0632  GLUCAP 107* 88 91 106* 107*   D-Dimer No results for input(s): DDIMER in the last 72 hours. Hgb A1c No results for input(s): HGBA1C in the last 72 hours. Lipid Profile No results for input(s): CHOL, HDL, LDLCALC, TRIG, CHOLHDL, LDLDIRECT in the last 72 hours. Thyroid function studies No results for input(s): TSH, T4TOTAL, T3FREE, THYROIDAB in the last 72 hours.  Invalid  input(s): FREET3 Anemia work up No results for input(s): VITAMINB12, FOLATE, FERRITIN, TIBC, IRON, RETICCTPCT in the last 72 hours. Urinalysis    Component Value Date/Time   COLORURINE YELLOW 08/08/2020 1747   APPEARANCEUR CLEAR 08/08/2020 1747   LABSPEC 1.020 08/08/2020 1747   PHURINE 5.0 08/08/2020 1747   GLUCOSEU NEGATIVE 08/08/2020 1747   HGBUR NEGATIVE 08/08/2020 1747   HGBUR negative 03/17/2010 1010   BILIRUBINUR NEGATIVE 08/08/2020 1747    KETONESUR 5 (A) 08/08/2020 1747   PROTEINUR NEGATIVE 08/08/2020 1747   UROBILINOGEN 0.2 03/17/2010 1010   NITRITE NEGATIVE 08/08/2020 1747   LEUKOCYTESUR NEGATIVE 08/08/2020 1747   Sepsis Labs Invalid input(s): PROCALCITONIN,  WBC,  LACTICIDVEN Microbiology Recent Results (from the past 240 hour(s))  Culture, Group A Strep     Status: None   Collection Time: 08/06/20  3:27 PM   Specimen: Throat  Result Value Ref Range Status   MICRO NUMBER: [id number]  Final   SPECIMEN QUALITY: Adequate  Final   SOURCE: THROAT  Final   STATUS: FINAL  Final   RESULT: No group A Streptococcus isolated  Final  Blood Culture (routine x 2)     Status: None   Collection Time: 08/07/20  4:00 PM   Specimen: BLOOD RIGHT ARM  Result Value Ref Range Status   Specimen Description   Final    BLOOD RIGHT ARM BLOOD Performed at [facility], [address] [place], [place] [address]    Special Requests   Final    Blood Culture adequate volume BOTTLES DRAWN AEROBIC AND ANAEROBIC Performed at [facility], [address] [place], [place] [address]    Culture   Final    NO GROWTH 5 DAYS Performed at [facility], [place] [address] [place], [place] [address]    Report Status 08/13/2020 FINAL  Final  Blood Culture (routine x 2)     Status: None   Collection Time: 08/07/20  4:05 PM   Specimen: BLOOD RIGHT HAND  Result Value Ref Range Status   Specimen Description   Final    BLOOD RIGHT HAND BLOOD Performed at [facility], [address] [place], [place] [address]    Special Requests   Final    BOTTLES DRAWN AEROBIC AND ANAEROBIC Blood Culture results may not be optimal due to an inadequate volume of blood received in culture bottles Performed at [facility], [address] [place], [place] [address]    Culture   Final    NO GROWTH 5 DAYS Performed at [facility], [place] [address], [place], [place] [address]    Report Status 08/13/2020 FINAL   Final  Resp Panel by RT-PCR (Flu A&B, Covid) Peripheral     Status: None   Collection Time: 08/07/20  4:17 PM   Specimen: Peripheral; Nasopharyngeal(NP) swabs in vial transport medium  Result Value Ref Range Status   SARS Coronavirus 2 by RT PCR NEGATIVE NEGATIVE Final    Comment: (NOTE) SARS-CoV-2 target nucleic acids are NOT DETECTED.  The SARS-CoV-2 RNA is generally detectable in upper respiratory specimens during the acute phase of infection. The lowest concentration of SARS-CoV-2 viral copies this assay can detect is 138 copies/mL. A negative result does not preclude SARS-Cov-2 infection and should not be used as the sole basis for treatment or other patient management decisions. A negative result may occur with  improper specimen collection/handling, submission of specimen other than nasopharyngeal swab,  presence of viral mutation(s) within the areas targeted by this assay, and inadequate number of viral copies(<138 copies/mL). A negative result must be combined with clinical observations, patient history, and epidemiological information. The expected result is Negative.  Fact Sheet for Patients:  EntrepreneurPulse.com.au  Fact Sheet for Healthcare Providers:  IncredibleEmployment.be  This test is no t yet approved or cleared by the [place] FDA and  has been authorized for detection and/or diagnosis of SARS-CoV-2 by FDA under an Emergency Use Authorization (EUA). This EUA will remain  in effect (meaning this test can be used) for the duration of the COVID-19 declaration under Section 564(b)(1) of the Act, 21 U.S.C.section 360bbb-3(b)(1), unless the authorization is terminated  or revoked sooner.       Influenza A by PCR NEGATIVE NEGATIVE Final   Influenza B by PCR NEGATIVE NEGATIVE Final    Comment: (NOTE) The Xpert Xpress SARS-CoV-2/FLU/RSV plus assay is intended as an aid in the diagnosis of influenza from Nasopharyngeal swab  specimens and should not be used as a sole basis for treatment. Nasal washings and aspirates are unacceptable for Xpert Xpress SARS-CoV-2/FLU/RSV testing.  Fact Sheet for Patients: EntrepreneurPulse.com.au  Fact Sheet for Healthcare Providers: IncredibleEmployment.be  This test is not yet approved or cleared by the [place] FDA and has been authorized for detection and/or diagnosis of SARS-CoV-2 by FDA under an Emergency Use Authorization (EUA). This EUA will remain in effect (meaning this test can be used) for the duration of the COVID-19 declaration under Section 564(b)(1) of the Act, 21 U.S.C. section 360bbb-3(b)(1), unless the authorization is terminated or revoked.  Performed at [facility], [address], [place], [place] [address]      Time coordinating discharge: 39 minutes  SIGNED:   Georgette Shell, MD  [facility] 08/13/2020, 9:59 AM

## 2020-08-13 NOTE — TOC Initial Note (Signed)
Transition of Care [facility]) - Initial/Assessment Note    Patient Details  Name: [name] MRN: [id number] Date of Birth: 12-18-1928  Transition of [facility]) CM/SW Contact:    Shabrea Weldin, Marjie Skiff, RN Phone Number: 08/13/2020, 11:45 AM  Clinical Narrative:                  Expected Discharge Plan: [place] Barriers to Discharge: No Barriers Identified   Patient Goals and CMS Choice Patient states their goals for this hospitalization and ongoing recovery are:: To go home CMS Medicare.gov Compare Post Acute Care list provided to:: Patient Choice offered to / list presented to : Patient  Expected Discharge Plan and Services Expected Discharge Plan: [place]   Discharge Planning Services: CM Consult Post Acute Care Choice: [place] arrangements for the past 2 months: Single Family Home Expected Discharge Date: 08/13/20                         [facility] Arranged: PT HH Agency: [place] Date [place]: 08/13/20 Time HH Agency Contacted: 29 Representative spoke with at [place]: Tommi Rumps  Prior Living Arrangements/Services Living arrangements for the past 2 months: [place] Lives with:: Adult Children Patient language and need for interpreter reviewed:: Yes Do you feel safe going back to the place where you live?: Yes      Need for Family Participation in Patient Care: Yes (Comment) Care giver support system in place?: Yes (comment) Current home services: Home PT Criminal Activity/Legal Involvement Pertinent to Current Situation/Hospitalization: No - Comment as needed  Activities of Daily Living Home Assistive Devices/Equipment: Wheelchair ADL Screening (condition at time of admission) Patient's cognitive ability adequate to safely complete daily activities?: No Is the patient deaf or have difficulty hearing?: No Does the patient have difficulty seeing, even when wearing glasses/contacts?: Yes Does  the patient have difficulty concentrating, remembering, or making decisions?: Yes Patient able to express need for assistance with ADLs?: Yes Does the patient have difficulty dressing or bathing?: Yes Independently performs ADLs?: No Dressing (OT): Needs assistance Is this a change from baseline?: Change from baseline, expected to last >3 days Grooming: Needs assistance Is this a change from baseline?: Change from baseline, expected to last >3 days Feeding: Needs assistance Is this a change from baseline?: Change from baseline, expected to last <3 days Bathing: Needs assistance Is this a change from baseline?: Change from baseline, expected to last <3 days Toileting: Needs assistance Is this a change from baseline?: Change from baseline, expected to last <3 days In/Out Bed: Needs assistance Is this a change from baseline?: Change from baseline, expected to last <3 days Walks in Home: Needs assistance Is this a change from baseline?: Change from baseline, expected to last >3 days Does the patient have difficulty walking or climbing stairs?: Yes Weakness of Legs: Both Weakness of Arms/Hands: Right  Permission Sought/Granted Permission sought to share information with : Chartered certified accountant granted to share information with : Yes, Verbal Permission Granted     Permission granted to share info w AGENCY: [facility]        Emotional Assessment Appearance:: Appears stated age Attitude/Demeanor/Rapport: Gracious Affect (typically observed): Calm Orientation: : Oriented to Self,Oriented to Place,Oriented to  Time,Oriented to Situation Alcohol / Substance Use: Not Applicable    Admission diagnosis:  Hypoxia [R09.02] Stomatitis [K12.1] Ulcerative stomatitis [K12.1] Methotrexate adverse reaction, initial encounter [T45.1X5A] Patient Active  Problem List   Diagnosis Date Noted  . 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 Chronic kidney disease, stage 3a ([place]) 08/07/2020  .  Leukopenia 08/07/2020  . Acute respiratory failure with hypoxia ([place]) 08/07/2020  . SIRS (systemic inflammatory response syndrome) ([place]) 08/07/2020  . Methotrexate adverse reaction, initial encounter   . Acute pain of right shoulder 04/14/2020  . Lower extremity edema 11/13/2019  . Head trauma 11/13/2019  . Dyslipidemia 11/13/2019  . Memory loss 07/28/2019  . Chronic right shoulder pain 07/28/2019  . Frequent falls 07/28/2019  . Balance problem 07/28/2019  . Dysphagia 07/28/2019  . ICH (intracerebral hemorrhage) ([place]) 03/10/2019  . Basilar artery stenosis with infarction ([place]) 12/18/2017  . TIA (transient ischemic attack) 12/10/2017  . Rheumatoid arthritis ([place])   . Chest pain 06/02/2011  . General medical examination 04/11/2011  . PEPTIC ULCER DISEASE, HELICOBACTER PYLORI POSITIVE 07/12/2010  . PERSONAL HISTORY MALIG NEOPLASM LARGE INTESTINE 05/18/2010  . MITRAL VALVE DISORDERS 04/05/2009  . HOARSENESS 12/04/2008  . OSTEOPENIA 08/26/2008  . HYPERTRIGLYCERIDEMIA 07/23/2008  . EPISTAXIS 07/26/2007  . ERECTILE DYSFUNCTION 02/22/2007  . ECZEMA 12/07/2006  . LEG CRAMPS 10/09/2006  . Hypothyroidism 08/28/2006  . Essential hypertension 08/28/2006  . HAY FEVER 08/28/2006  . INSOMNIA 08/28/2006   PCP:  [name], DO Pharmacy:   [id number] [place], [address] [place] [address] Phone: [phone] Fax: [place], [address], [address] [place], [address] [place] [address] Phone: [phone] Fax: [phone]     Social Determinants of Health (SDOH) Interventions    Readmission Risk Interventions Readmission Risk Prevention Plan 03/11/2019  Post Dischage Appt Complete  Medication Screening Complete  Transportation Screening Complete  Some recent data might be hidden

## 2020-08-16 ENCOUNTER — Telehealth: Payer: Self-pay

## 2020-08-16 NOTE — Telephone Encounter (Signed)
Transition Care Management Unsuccessful Follow-up Telephone Call  Date of discharge and from where:  08/13/2020-[place]  Attempts:  1st Attempt  Reason for unsuccessful TCM follow-up call:  No answer/busy

## 2020-08-17 NOTE — Telephone Encounter (Signed)
Transition Care Management Unsuccessful Follow-up Telephone Call  Date of discharge and from where:  08/13/2020-[place]  Attempts:  3rd Attempt  Reason for unsuccessful TCM follow-up call:  No answer/busy

## 2020-08-24 ENCOUNTER — Ambulatory Visit: Payer: Medicare Other | Attending: Internal Medicine

## 2020-08-24 DIAGNOSIS — Z23 Encounter for immunization: Secondary | ICD-10-CM

## 2020-08-24 NOTE — Progress Notes (Signed)
   Covid-19 Vaccination Clinic  Name:  [name]    MRN: [id number] DOB: [birth year]  08/24/2020  [name] was observed post Covid-19 immunization for 15 minutes without incident. He was provided with Vaccine Information Sheet and instruction to access the V-Safe system.   [name] was instructed to call 911 with any severe reactions post vaccine: Marland Kitchen Difficulty breathing  . Swelling of face and throat  . A fast heartbeat  . A bad rash all over body  . Dizziness and weakness   Immunizations Administered    Name Date Dose VIS Date Route   PFIZER Comrnaty(Gray TOP) Covid-19 Vaccine 08/24/2020 11:18 AM 0.3 mL 04/29/2020 Intramuscular   Manufacturer: [place]   Lot: [id number]   [place]: [phone]

## 2020-08-31 ENCOUNTER — Other Ambulatory Visit (HOSPITAL_BASED_OUTPATIENT_CLINIC_OR_DEPARTMENT_OTHER): Payer: Self-pay

## 2020-08-31 MED ORDER — PFIZER-BIONT COVID-19 VAC-TRIS 30 MCG/0.3ML IM SUSP
INTRAMUSCULAR | 0 refills | Status: DC
  Filled 2020-08-31: qty 0.3, 1d supply, fill #0

## 2020-09-07 ENCOUNTER — Other Ambulatory Visit: Payer: Self-pay | Admitting: Family Medicine

## 2020-09-07 ENCOUNTER — Other Ambulatory Visit: Payer: Self-pay

## 2020-09-07 ENCOUNTER — Encounter: Payer: Self-pay | Admitting: Family Medicine

## 2020-09-07 ENCOUNTER — Ambulatory Visit (INDEPENDENT_AMBULATORY_CARE_PROVIDER_SITE_OTHER): Payer: Medicare Other | Admitting: Family Medicine

## 2020-09-07 VITALS — BP 128/60 | HR 66 | Temp 97.8°F | Resp 18 | Ht 60.0 in | Wt 121.8 lb

## 2020-09-07 DIAGNOSIS — R809 Proteinuria, unspecified: Secondary | ICD-10-CM

## 2020-09-07 DIAGNOSIS — J0140 Acute pansinusitis, unspecified: Secondary | ICD-10-CM

## 2020-09-07 DIAGNOSIS — N289 Disorder of kidney and ureter, unspecified: Secondary | ICD-10-CM

## 2020-09-07 MED ORDER — DOXYCYCLINE HYCLATE 100 MG PO TABS: 100.0000 mg | ORAL_TABLET | Freq: Two times a day (BID) | ORAL | 0 refills | Status: DC

## 2020-09-07 MED ORDER — FLUTICASONE PROPIONATE 50 MCG/ACT NA SUSP: 2.0000 | Freq: Every day | NASAL | 6 refills | Status: DC

## 2020-09-07 NOTE — Progress Notes (Signed)
Subjective:   By signing my name below, I, [name], attest that this documentation has been prepared under the direction and in the presence of  Dr. [name], DO. 09/07/2020    Patient ID: [name], male    DOB: 1928-07-07, 87 y.o.   MRN: [id number]  Chief Complaint  Patient presents with  . Sinusitis    X 3 weeks. Clear drainage- thickening today. Tried Claritin and mucinex    HPI Patient is in today for a office visit. His daughter has came to the appointment to assist her father with communication. He complains of a runny nose that started 3 weeks. He mentions he has been having clear nasal drainage, but recently it has progressed into thick green drainage. He has been taking Claritin to help treat his symptoms and has mild relief. He also reports having swelling in his tongue. His daughter thinks this is a reaction from his methotrexate medication. He has taken a Covid 19 test and received negative results. He denies any fever, cough, sore throat, chest pain, tightness, leg swelling, cough, shortness of breath, nausea, vomiting, diarrhea, dysuria, and headache at this time.   Past Medical History:  Diagnosis Date  . Adenocarcinoma ([place]) 1985   colon s/p right hemicolectomy and chemotherapy. F/U with cancer center and Dr. Lucia Lindsey rotinely  . Depression   . Eczema   . Epistaxis    f/u per ENT  . Erectile dysfunction   . Gastric ulcer 12/11   H-Pylori Tx, EGD 3-12: gastritis  . Hay fever   . Head injury 11/2017   with fall  . Hyperlipemia   . Hypertension   . Hypothyroidism    Hypothyroid  . Insomnia   . Osteopenia     per DEXA 07-2008 (Rx fosamax)  . Rheumatoid arthritis ([place])   . Stroke [facility])     " balance issue"    Past Surgical History:  Procedure Laterality Date  . COLON SURGERY     Right / Adenocarcinoma / Chemo  . COLONOSCOPY  2005, 2008   Dr. Lucia Lindsey  . INGUINAL HERNIA REPAIR     Right  . IR ANGIO INTRA EXTRACRAN SEL COM CAROTID INNOMINATE  BILAT MOD SED  12/13/2017  . IR ANGIO VERTEBRAL SEL VERTEBRAL BILAT MOD SED  12/13/2017  . IR PTA INTRACRANIAL  12/18/2017  . RADIOLOGY WITH ANESTHESIA N/A 12/18/2017   Procedure: Angioplasty with stenting;  Surgeon: Luanne Bras, MD;  Location: [facility];  Service: Radiology;  Laterality: N/A;    Family History  Problem Relation Age of Onset  . Diabetes Neg Hx   . Heart attack Neg Hx   . Colon cancer Neg Hx   . Prostate cancer Neg Hx     Social History   Socioeconomic History  . Marital status: Married    Spouse name: Not on file  . Number of children: 2  . Years of education: Not on file  . Highest education level: Not on file  Occupational History    Employer: RETIRED  Tobacco Use  . Smoking status: Never Smoker  . Smokeless tobacco: Never Used  Vaping Use  . Vaping Use: Never used  Substance and Sexual Activity  . Alcohol use: No    Alcohol/week: 0.0 standard drinks  . Drug use: No  . Sexual activity: Not on file  Other Topics Concern  . Not on file  Social History Narrative   Original from [place]   Vegetarian   Daily caffeine use one per  day   Social Determinants of Health   Financial Resource Strain: Not on file  Food Insecurity: Not on file  Transportation Needs: Not on file  Physical Activity: Not on file  Stress: Not on file  Social Connections: Not on file  Intimate Partner Violence: Not on file    Outpatient Medications Prior to Visit  Medication Sig Dispense Refill  . aspirin EC 81 MG tablet Take 81 mg by mouth daily with lunch. Swallow whole.    . bacitracin 500 UNIT/GM ointment Apply topically 2 (two) times daily. 15 g 0  . CALCIUM PO Take 1 tablet by mouth every other day.    . chlorhexidine (PERIDEX) 0.12 % solution Use as directed 10 mLs in the mouth or throat 4 (four) times daily. 120 mL 2  . Cholecalciferol (VITAMIN D3 PO) Take 1 tablet by mouth daily with lunch.    Marland Kitchen COVID-19 mRNA Vac-TriS, Pfizer, (PFIZER-BIONT COVID-19 VAC-TRIS) SUSP  injection Inject into the muscle. 0.3 mL 0  . docusate sodium (COLACE) 100 MG capsule Take 200 mg by mouth daily.    . folic acid (FOLVITE) 1 MG tablet Take 1 tablet (1 mg total) by mouth every morning. 30 tablet 6  . furosemide (LASIX) 20 MG tablet Take 1 tablet (20 mg total) by mouth daily. (Patient taking differently: Take 20 mg by mouth daily as needed for fluid or edema.) 90 tablet 1  . gemfibrozil (LOPID) 600 MG tablet Take 1 tablet (600 mg total) by mouth daily. (Patient taking differently: Take 600 mg by mouth daily before lunch.) 90 tablet 1  . Glucosamine-Chondroitin (OSTEO BI-FLEX REGULAR STRENGTH PO) Take 1 tablet by mouth every morning.    Marland Kitchen guaiFENesin (MUCINEX) 600 MG 12 hr tablet Take 600 mg by mouth every other day.    . levothyroxine (SYNTHROID) 100 MCG tablet Take 1 tablet (100 mcg total) by mouth daily before breakfast. 90 tablet 1  . loratadine (CLARITIN) 10 MG tablet Take 10 mg by mouth daily.    Marland Kitchen losartan-hydrochlorothiazide (HYZAAR) 50-12.5 MG tablet Take 1 tablet by mouth daily. 90 tablet 1  . magic mouthwash (lidocaine, diphenhydrAMINE, alum & mag hydroxide) suspension 5 ml po qid swish and spit (Patient taking differently: Swish and spit 5 mLs 4 (four) times daily as needed for mouth pain.) 200 mL 0  . mirabegron ER (MYRBETRIQ) 25 MG TB24 tablet Take 25 mg by mouth at bedtime.    Marland Kitchen nystatin (MYCOSTATIN) 100000 UNIT/ML suspension Take 5 mLs (500,000 Units total) by mouth 4 (four) times daily. 60 mL 2  . polyvinyl alcohol (LIQUIFILM TEARS) 1.4 % ophthalmic solution Place 1 drop into both eyes daily as needed for dry eyes.    . traMADol (ULTRAM) 50 MG tablet Take 0.5 tablets (25 mg total) by mouth every 6 (six) hours as needed for severe pain. 30 tablet 0  . traZODone (DESYREL) 50 MG tablet Take 1 tablet (50 mg total) by mouth at bedtime. 30 tablet 0   No facility-administered medications prior to visit.    Allergies  Allergen Reactions  . Methotrexate Derivatives Other  (See Comments)    Mouth sores  . Asa [Aspirin] Other (See Comments)    Gastric symptoms - can tolerate 81 mg aspirin  . Penicillin G Rash    Did it involve swelling of the face/tongue/throat, SOB, or low BP? Unknown Did it involve sudden or severe rash/hives, skin peeling, or any reaction on the inside of your mouth or nose? Unknown Did you need to  seek medical attention at a hospital or doctor's office? Unknown When did it last happen?unknown If all above answers are "NO", may proceed with cephalosporin use.    Review of Systems  Constitutional: Negative for chills and fever.  HENT: Positive for congestion (nasal) and sinus pain. Negative for ear pain and sore throat.        (+)thick green nasal drainage  (+)swelling in the tongue  Eyes: Negative for pain.  Respiratory: Positive for cough. Negative for sputum production, shortness of breath and wheezing.   Cardiovascular: Negative for chest pain, palpitations and leg swelling.  Gastrointestinal: Negative for abdominal pain, diarrhea, nausea and vomiting.  Genitourinary: Negative for dysuria.  Neurological: Negative for dizziness and headaches.       Objective:    Physical Exam Vitals and nursing note reviewed.  Constitutional:      General: He is not in acute distress.    Appearance: Normal appearance. He is well-developed. He is not ill-appearing.  HENT:     Head: Normocephalic and atraumatic.     Right Ear: External ear normal.     Left Ear: External ear normal.     Nose: Congestion and rhinorrhea present.     Comments: Maxillary pressure    Mouth/Throat:     Comments: Thrush located on the tongue Eyes:     General:        Right eye: No discharge.        Left eye: No discharge.     Extraocular Movements: Extraocular movements intact.     Conjunctiva/sclera: Conjunctivae normal.     Pupils: Pupils are equal, round, and reactive to light.  Cardiovascular:     Rate and Rhythm: Normal rate and regular rhythm.      Pulses: Normal pulses.     Heart sounds: Normal heart sounds. No murmur heard.   Pulmonary:     Effort: Pulmonary effort is normal. No respiratory distress.     Breath sounds: Normal breath sounds. No wheezing, rhonchi or rales.  Chest:     Chest wall: No tenderness.  Lymphadenopathy:     Cervical: Cervical adenopathy present.  Skin:    General: Skin is warm and dry.  Neurological:     Mental Status: He is alert and oriented to person, place, and time.  Psychiatric:        Behavior: Behavior normal.     BP 128/60 (BP Location: Left Arm, Patient Position: Sitting, Cuff Size: Normal)   Pulse 66   Temp 97.8 F (36.6 C) (Oral)   Resp 18   Ht 5' (1.524 m)   Wt 121 lb 12.8 oz (55.2 kg)   SpO2 100%   BMI 23.79 kg/m  Wt Readings from Last 3 Encounters:  09/07/20 121 lb 12.8 oz (55.2 kg)  08/09/20 125 lb (56.7 kg)  08/06/20 135 lb (61.2 kg)    Diabetic Foot Exam - Simple   No data filed    Lab Results  Component Value Date   WBC 2.5 (L) 08/13/2020   HGB 10.9 (L) 08/13/2020   HCT 32.6 (L) 08/13/2020   PLT 113 (L) 08/13/2020   GLUCOSE 108 (H) 08/13/2020   CHOL 151 04/13/2020   TRIG 155.0 (H) 04/13/2020   HDL 42.90 04/13/2020   LDLDIRECT 74.9 04/11/2011   LDLCALC 77 04/13/2020   ALT 24 08/07/2020   AST 33 08/07/2020   NA 137 08/13/2020   K 3.7 08/13/2020   CL 110 08/13/2020   CREATININE 1.08 08/13/2020   BUN  9 08/13/2020   CO2 20 (L) 08/13/2020   TSH 1.90 04/13/2020   PSA 3.76 04/11/2011   INR 1.1 08/07/2020   HGBA1C 6.3 (H) 12/11/2017    Lab Results  Component Value Date   TSH 1.90 04/13/2020   Lab Results  Component Value Date   WBC 2.5 (L) 08/13/2020   HGB 10.9 (L) 08/13/2020   HCT 32.6 (L) 08/13/2020   MCV 89.3 08/13/2020   PLT 113 (L) 08/13/2020   Lab Results  Component Value Date   NA 137 08/13/2020   K 3.7 08/13/2020   CO2 20 (L) 08/13/2020   GLUCOSE 108 (H) 08/13/2020   BUN 9 08/13/2020   CREATININE 1.08 08/13/2020   BILITOT 0.6  08/07/2020   ALKPHOS 67 08/07/2020   AST 33 08/07/2020   ALT 24 08/07/2020   PROT 8.4 (H) 08/07/2020   ALBUMIN 4.0 08/07/2020   CALCIUM 8.3 (L) 08/13/2020   ANIONGAP 7 08/13/2020   GFR 50.44 (L) 04/13/2020   Lab Results  Component Value Date   CHOL 151 04/13/2020   Lab Results  Component Value Date   HDL 42.90 04/13/2020   Lab Results  Component Value Date   LDLCALC 77 04/13/2020   Lab Results  Component Value Date   TRIG 155.0 (H) 04/13/2020   Lab Results  Component Value Date   CHOLHDL 4 04/13/2020   Lab Results  Component Value Date   HGBA1C 6.3 (H) 12/11/2017       Assessment & Plan:   Problem List Items Addressed This Visit   None   Visit Diagnoses    Acute non-recurrent pansinusitis    -  Primary   Relevant Medications   doxycycline (VIBRA-TABS) 100 MG tablet   fluticasone (FLONASE) 50 MCG/ACT nasal spray    home covid test neg  con't antihistamine as well    Meds ordered this encounter  Medications  . doxycycline (VIBRA-TABS) 100 MG tablet    Sig: Take 1 tablet (100 mg total) by mouth 2 (two) times daily.    Dispense:  20 tablet    Refill:  0  . fluticasone (FLONASE) 50 MCG/ACT nasal spray    Sig: Place 2 sprays into both nostrils daily.    Dispense:  16 g    Refill:  6    I, [name], DO, personally preformed the services described in this documentation.  All medical record entries made by the scribe were at my direction and in my presence.  I have reviewed the chart and discharge instructions (if applicable) and agree that the record reflects my personal performance and is accurate and complete. 09/07/2020   I,[name],acting as a scribe for [name], DO.,have documented all relevant documentation on the behalf of [name], DO,as directed by  [name], DO while in the presence of [place], DO, have reviewed all documentation for this visit. The  documentation on 09/07/20 for the exam, diagnosis, procedures, and orders are all accurate and complete.   [name], DO

## 2020-09-07 NOTE — Patient Instructions (Signed)
Sinusitis, Adult Sinusitis is inflammation of your sinuses. Sinuses are hollow spaces in the bones around your face. Your sinuses are located:  Around your eyes.  In the middle of your forehead.  Behind your nose.  In your cheekbones. Mucus normally drains out of your sinuses. When your nasal tissues become inflamed or swollen, mucus can become trapped or blocked. This allows bacteria, viruses, and fungi to grow, which leads to infection. Most infections of the sinuses are caused by a virus. Sinusitis can develop quickly. It can last for up to 4 weeks (acute) or for more than 12 weeks (chronic). Sinusitis often develops after a cold. What are the causes? This condition is caused by anything that creates swelling in the sinuses or stops mucus from draining. This includes:  Allergies.  Asthma.  Infection from bacteria or viruses.  Deformities or blockages in your nose or sinuses.  Abnormal growths in the nose (nasal polyps).  Pollutants, such as chemicals or irritants in the air.  Infection from fungi (rare). What increases the risk? You are more likely to develop this condition if you:  Have a weak body defense system (immune system).  Do a lot of swimming or diving.  Overuse nasal sprays.  Smoke. What are the signs or symptoms? The main symptoms of this condition are pain and a feeling of pressure around the affected sinuses. Other symptoms include:  Stuffy nose or congestion.  Thick drainage from your nose.  Swelling and warmth over the affected sinuses.  Headache.  Upper toothache.  A cough that may get worse at night.  Extra mucus that collects in the throat or the back of the nose (postnasal drip).  Decreased sense of smell and taste.  Fatigue.  A fever.  Sore throat.  Bad breath. How is this diagnosed? This condition is diagnosed based on:  Your symptoms.  Your medical history.  A physical exam.  Tests to find out if your condition is  acute or chronic. This may include: ? Checking your nose for nasal polyps. ? Viewing your sinuses using a device that has a light (endoscope). ? Testing for allergies or bacteria. ? Imaging tests, such as an MRI or CT scan. In rare cases, a bone biopsy may be done to rule out more serious types of fungal sinus disease. How is this treated? Treatment for sinusitis depends on the cause and whether your condition is chronic or acute.  If caused by a virus, your symptoms should go away on their own within 10 days. You may be given medicines to relieve symptoms. They include: ? Medicines that shrink swollen nasal passages (topical intranasal decongestants). ? Medicines that treat allergies (antihistamines). ? A spray that eases inflammation of the nostrils (topical intranasal corticosteroids). ? Rinses that help get rid of thick mucus in your nose (nasal saline washes).  If caused by bacteria, your health care provider may recommend waiting to see if your symptoms improve. Most bacterial infections will get better without antibiotic medicine. You may be given antibiotics if you have: ? A severe infection. ? A weak immune system.  If caused by narrow nasal passages or nasal polyps, you may need to have surgery. Follow these instructions at home: Medicines  Take, use, or apply over-the-counter and prescription medicines only as told by your health care provider. These may include nasal sprays.  If you were prescribed an antibiotic medicine, take it as told by your health care provider. Do not stop taking the antibiotic even if you start   to feel better. Hydrate and humidify  Drink enough fluid to keep your urine pale yellow. Staying hydrated will help to thin your mucus.  Use a cool mist humidifier to keep the humidity level in your home above 50%.  Inhale steam for 10-15 minutes, 3-4 times a day, or as told by your health care provider. You can do this in the bathroom while a hot shower is  running.  Limit your exposure to cool or dry air.   Rest  Rest as much as possible.  Sleep with your head raised (elevated).  Make sure you get enough sleep each night. General instructions  Apply a warm, moist washcloth to your face 3-4 times a day or as told by your health care provider. This will help with discomfort.  Wash your hands often with soap and water to reduce your exposure to germs. If soap and water are not available, use hand sanitizer.  Do not smoke. Avoid being around people who are smoking (secondhand smoke).  Keep all follow-up visits as told by your health care provider. This is important.   Contact a health care provider if:  You have a fever.  Your symptoms get worse.  Your symptoms do not improve within 10 days. Get help right away if:  You have a severe headache.  You have persistent vomiting.  You have severe pain or swelling around your face or eyes.  You have vision problems.  You develop confusion.  Your neck is stiff.  You have trouble breathing. Summary  Sinusitis is soreness and inflammation of your sinuses. Sinuses are hollow spaces in the bones around your face.  This condition is caused by nasal tissues that become inflamed or swollen. The swelling traps or blocks the flow of mucus. This allows bacteria, viruses, and fungi to grow, which leads to infection.  If you were prescribed an antibiotic medicine, take it as told by your health care provider. Do not stop taking the antibiotic even if you start to feel better.  Keep all follow-up visits as told by your health care provider. This is important. This information is not intended to replace advice given to you by your health care provider. Make sure you discuss any questions you have with your health care provider. Document Revised: 10/08/2017 Document Reviewed: 10/08/2017 Elsevier Patient Education  2021 Elsevier Inc.  

## 2020-12-02 ENCOUNTER — Other Ambulatory Visit: Payer: Self-pay | Admitting: Family Medicine

## 2020-12-07 DIAGNOSIS — I959 Hypotension, unspecified: Secondary | ICD-10-CM | POA: Diagnosis not present

## 2020-12-07 DIAGNOSIS — R42 Dizziness and giddiness: Secondary | ICD-10-CM | POA: Diagnosis not present

## 2020-12-07 DIAGNOSIS — R0902 Hypoxemia: Secondary | ICD-10-CM | POA: Diagnosis not present

## 2020-12-07 DIAGNOSIS — S0990XA Unspecified injury of head, initial encounter: Secondary | ICD-10-CM | POA: Diagnosis not present

## 2020-12-07 DIAGNOSIS — W19XXXA Unspecified fall, initial encounter: Secondary | ICD-10-CM | POA: Diagnosis not present

## 2020-12-09 ENCOUNTER — Other Ambulatory Visit: Payer: Self-pay | Admitting: Family Medicine

## 2020-12-27 ENCOUNTER — Other Ambulatory Visit: Payer: Self-pay | Admitting: *Deleted

## 2020-12-27 ENCOUNTER — Telehealth: Payer: Self-pay | Admitting: *Deleted

## 2020-12-27 DIAGNOSIS — N289 Disorder of kidney and ureter, unspecified: Secondary | ICD-10-CM

## 2020-12-27 DIAGNOSIS — R809 Proteinuria, unspecified: Secondary | ICD-10-CM

## 2020-12-27 NOTE — Telephone Encounter (Signed)
Received fax that [facility] could not reach pt.  Spoke with daughter and number changed to hers and referral placed again.

## 2020-12-28 ENCOUNTER — Other Ambulatory Visit: Payer: Self-pay | Admitting: Family Medicine

## 2021-01-01 ENCOUNTER — Other Ambulatory Visit: Payer: Self-pay | Admitting: Family Medicine

## 2021-01-10 ENCOUNTER — Other Ambulatory Visit: Payer: Self-pay | Admitting: Family Medicine

## 2021-01-10 DIAGNOSIS — I10 Essential (primary) hypertension: Secondary | ICD-10-CM

## 2021-01-11 ENCOUNTER — Telehealth: Payer: Self-pay | Admitting: *Deleted

## 2021-01-11 NOTE — Chronic Care Management (AMB) (Signed)
  Chronic Care Management   Outreach Note  01/11/2021 Name: [name] MRN: [id number] DOB: [birth year]  [name] is a 87 y.o. year old male who is a primary care patient of [name], DO. I reached out to Baron Sane by phone today in response to a referral sent by Mr. Yosiah Stogdill's PCP Carollee Herter, Alferd Apa, DO     An unsuccessful telephone outreach was attempted today. The patient was referred to the case management team for assistance with care management and care coordination.   Follow Up Plan: A HIPAA compliant phone message was left for the patient providing contact information and requesting a return call.  If patient returns call to provider office, please advise to call Embedded Care Management Care Guide Benjamin Casanas at [place], [place] [facility]  Direct Dial: [phone]

## 2021-01-13 NOTE — Chronic Care Management (AMB) (Signed)
  Chronic Care Management   Outreach Note  01/13/2021 Name: [name] MRN: [id number] DOB: [birth year]  [name] is a 87 y.o. year old male who is a primary care patient of [name], DO. I reached out to [employer] by phone today in response to a referral sent by [name]'s PCP Carollee Herter, Alferd Apa, DO     A second unsuccessful telephone outreach was attempted today. The patient was referred to the case management team for assistance with care management and care coordination.   Follow Up Plan: A HIPAA compliant phone message was left for the patient providing contact information and requesting a return call.  If patient returns call to provider office, please advise to call Embedded Care Management Care Guide [name] at [place], [place] [facility]  Direct Dial: [phone]

## 2021-01-17 NOTE — Chronic Care Management (AMB) (Signed)
  Chronic Care Management   Note  01/17/2021 Name: Jarmar Rousseau MRN: [id number] DOB: January 24, 1929  Raydan Schlabach is a 87 y.o. year old male who is a primary care patient of [name], DO. I reached out to [employer] by phone today in response to a referral sent by Mr. Mauro Carawan's PCP, Carollee Herter, Alferd Apa, DO.      Mr. Senat was given information about Chronic Care Management services today including:  CCM service includes personalized support from designated clinical staff supervised by his physician, including individualized plan of care and coordination with other care providers 24/7 contact phone numbers for assistance for urgent and routine care needs. Service will only be billed when office clinical staff spend 20 minutes or more in a month to coordinate care. Only one practitioner may furnish and bill the service in a calendar month. The patient may stop CCM services at any time (effective at the end of the month) by phone call to the office staff. The patient will be responsible for cost sharing (co-pay) of up to 20% of the service fee (after annual deductible is met).  Patient agreed to services and verbal consent obtained.   Follow up plan: Telephone appointment with care management team member scheduled for:02/08/2021  [place] [facility]  Direct Dial: [phone]

## 2021-02-08 ENCOUNTER — Telehealth: Payer: Medicare Other

## 2021-02-08 ENCOUNTER — Telehealth: Payer: Self-pay

## 2021-02-08 NOTE — Telephone Encounter (Signed)
  Care Management   Follow Up Note   02/08/2021 Name: Tynell Winchell MRN: [id number] DOB: 09-Nov-1928   Referred by: [name], DO Reason for referral : Chronic Care Management (RNCM initial assessment)   Successful contact was made with patient/daughter(DPR), [name], to discuss care management and care coordination services. Ms. Tonye Royalty reports an urgent matter has come up and is unable to complete assessment at this time-request to reschedule.  Follow Up Plan: The care management team will reach out to the patient again over the next 30 days.   Thea Silversmith, RN, MSN, BSN, CCM Care Management Coordinator [facility] [phone]

## 2021-02-14 ENCOUNTER — Telehealth: Payer: Self-pay | Admitting: *Deleted

## 2021-02-14 NOTE — Chronic Care Management (AMB) (Signed)
  Care Management   Note  02/14/2021 Name: Eryx Zane MRN: [id number] DOB: [birth year]  Rudy Luhmann is a 87 y.o. year old male who is a primary care patient of [name], DO and is actively engaged with the care management team. I reached out to Baron Sane by phone today to assist with re-scheduling an initial visit with the RN Case Manager  Follow up plan: Unsuccessful telephone outreach attempt made. A HIPAA compliant phone message was left for the patient providing contact information and requesting a return call.   Julian Hy, [place] Management  Direct Dial: [phone]

## 2021-02-17 NOTE — Chronic Care Management (AMB) (Signed)
  Care Management   Note  02/17/2021 Name: Andras Grunewald MRN: [id number] DOB: Sep 26, 1928  [name] is a 87 y.o. year old male who is a primary care patient of [name], DO and is actively engaged with the care management team. I reached out to Baron Sane by phone today to assist with re-scheduling an initial visit with the RN Case Manager  Follow up plan: 2nd attempt Unsuccessful telephone outreach attempt made. A HIPAA compliant phone message was left for the patient providing contact information and requesting a return call.   Julian Hy, [place] Management  Direct Dial: [phone]

## 2021-02-23 NOTE — Chronic Care Management (AMB) (Signed)
  Care Management   Note  02/23/2021 Name: Lawerance Matsuo MRN: [id number] DOB: 1929-01-20  Bryen Hinderman is a 87 y.o. year old male who is a primary care patient of [name], DO and is actively engaged with the care management team. I reached out to Baron Sane by phone today to assist with re-scheduling an initial visit with the RN Case Manager  Follow up plan: 3rd attempt Unsuccessful telephone outreach attempt made. A HIPAA compliant phone message was left for the patient providing contact information and requesting a return call.  We have been unable to make contact with the patient for follow up. The care management team is available to follow up with the patient after provider conversation with the patient regarding recommendation for care management engagement and subsequent re-referral to the care management team.   Julian Hy, [place] Management  Direct Dial: [phone]

## 2021-02-24 ENCOUNTER — Other Ambulatory Visit: Payer: Self-pay

## 2021-02-25 ENCOUNTER — Encounter: Payer: Medicare Other | Admitting: Family Medicine

## 2021-02-25 ENCOUNTER — Other Ambulatory Visit: Payer: Self-pay | Admitting: Family Medicine

## 2021-02-25 ENCOUNTER — Other Ambulatory Visit: Payer: Self-pay

## 2021-02-25 ENCOUNTER — Other Ambulatory Visit (INDEPENDENT_AMBULATORY_CARE_PROVIDER_SITE_OTHER): Payer: Medicare Other

## 2021-02-25 DIAGNOSIS — I10 Essential (primary) hypertension: Secondary | ICD-10-CM

## 2021-02-25 DIAGNOSIS — E039 Hypothyroidism, unspecified: Secondary | ICD-10-CM

## 2021-02-25 DIAGNOSIS — Z125 Encounter for screening for malignant neoplasm of prostate: Secondary | ICD-10-CM

## 2021-02-25 DIAGNOSIS — E785 Hyperlipidemia, unspecified: Secondary | ICD-10-CM

## 2021-02-25 LAB — CBC WITH DIFFERENTIAL/PLATELET
Basophils Absolute: 0.1 10*3/uL (ref 0.0–0.1)
Basophils Relative: 0.7 % (ref 0.0–3.0)
Eosinophils Absolute: 0.3 10*3/uL (ref 0.0–0.7)
Eosinophils Relative: 4 % (ref 0.0–5.0)
HCT: 36.2 % — ABNORMAL LOW (ref 39.0–52.0)
Hemoglobin: 11.8 g/dL — ABNORMAL LOW (ref 13.0–17.0)
Lymphocytes Relative: 22.3 % (ref 12.0–46.0)
Lymphs Abs: 1.6 10*3/uL (ref 0.7–4.0)
MCHC: 32.7 g/dL (ref 30.0–36.0)
MCV: 92.2 fl (ref 78.0–100.0)
Monocytes Absolute: 0.7 10*3/uL (ref 0.1–1.0)
Monocytes Relative: 9 % (ref 3.0–12.0)
Neutro Abs: 4.7 10*3/uL (ref 1.4–7.7)
Neutrophils Relative %: 64 % (ref 43.0–77.0)
Platelets: 237 10*3/uL (ref 150.0–400.0)
RBC: 3.92 Mil/uL — ABNORMAL LOW (ref 4.22–5.81)
RDW: 13.9 % (ref 11.5–15.5)
WBC: 7.3 10*3/uL (ref 4.0–10.5)

## 2021-02-25 LAB — COMPREHENSIVE METABOLIC PANEL
ALT: 12 U/L (ref 0–53)
AST: 24 U/L (ref 0–37)
Albumin: 4.3 g/dL (ref 3.5–5.2)
Alkaline Phosphatase: 96 U/L (ref 39–117)
BUN: 45 mg/dL — ABNORMAL HIGH (ref 6–23)
CO2: 28 mEq/L (ref 19–32)
Calcium: 10 mg/dL (ref 8.4–10.5)
Chloride: 105 mEq/L (ref 96–112)
Creatinine, Ser: 1.75 mg/dL — ABNORMAL HIGH (ref 0.40–1.50)
GFR: 33.8 mL/min — ABNORMAL LOW (ref >60.00)
Glucose, Bld: 87 mg/dL (ref 70–99)
Potassium: 4.2 mEq/L (ref 3.5–5.1)
Sodium: 142 mEq/L (ref 135–145)
Total Bilirubin: 0.3 mg/dL (ref 0.2–1.2)
Total Protein: 7.5 g/dL (ref 6.0–8.3)

## 2021-02-25 LAB — LIPID PANEL
Cholesterol: 166 mg/dL (ref 0–200)
HDL: 56.3 mg/dL (ref >39.00)
LDL Cholesterol: 98 mg/dL (ref 0–99)
NonHDL: 110.02
Total CHOL/HDL Ratio: 3
Triglycerides: 60 mg/dL (ref 0.0–149.0)
VLDL: 12 mg/dL (ref 0.0–40.0)

## 2021-02-25 LAB — TSH: TSH: 13.57 u[IU]/mL — ABNORMAL HIGH (ref 0.35–5.50)

## 2021-02-25 NOTE — Progress Notes (Incomplete)
Subjective:   By signing my name below, I, Zite Okoli, attest that this documentation has been prepared under the direction and in the presence of [name] R DO. 02/25/2021   Patient ID: [name], male    DOB: 07/02/1928, 87 y.o.   MRN: [id number]  No chief complaint on file.   HPI Patient is in today for a comprehensive physical exam.  He denies fever, hearing loss, ear pain,congestion, sinus pain, sore throat, eye pain, chest pain, palpitations, cough, shortness of breath, wheezing, nausea. vomiting, diarrhea, constipation, blood in stool, dysuria,frequency, hematuria and headaches.   Past Medical History:  Diagnosis Date   Adenocarcinoma ([place]) 1985   colon s/p right hemicolectomy and chemotherapy. F/U with cancer center and Dr. Lucia Gaskins rotinely   Depression    Eczema    Epistaxis    f/u per ENT   Erectile dysfunction    Gastric ulcer 12/11   H-Pylori Tx, EGD 3-12: gastritis   Hay fever    Head injury 11/2017   with fall   Hyperlipemia    Hypertension    Hypothyroidism    Hypothyroid   Insomnia    Osteopenia     per DEXA 07-2008 (Rx fosamax)   Rheumatoid arthritis ([place])    Stroke ([place])     " balance issue"    Past Surgical History:  Procedure Laterality Date   COLON SURGERY     Right / Adenocarcinoma / Chemo   COLONOSCOPY  2005, 2008   Dr. Lucia Gaskins   INGUINAL HERNIA REPAIR     Right   IR ANGIO INTRA EXTRACRAN SEL COM CAROTID INNOMINATE BILAT MOD SED  12/13/2017   IR ANGIO VERTEBRAL SEL VERTEBRAL BILAT MOD SED  12/13/2017   IR PTA INTRACRANIAL  12/18/2017   RADIOLOGY WITH ANESTHESIA N/A 12/18/2017   Procedure: Angioplasty with stenting;  Surgeon: Luanne Bras, MD;  Location: [facility];  Service: Radiology;  Laterality: N/A;    Family History  Problem Relation Age of Onset   Diabetes Neg Hx    Heart attack Neg Hx    Colon cancer Neg Hx    Prostate cancer Neg Hx     Social History   Socioeconomic History   Marital status: Married    Spouse  name: Not on file   Number of children: 2   Years of education: Not on file   Highest education level: Not on file  Occupational History    Employer: RETIRED  Tobacco Use   Smoking status: Never   Smokeless tobacco: Never  Vaping Use   Vaping Use: Never used  Substance and Sexual Activity   Alcohol use: No    Alcohol/week: 0.0 standard drinks   Drug use: No   Sexual activity: Not on file  Other Topics Concern   Not on file  Social History Narrative   Original from [place]   Vegetarian   Daily caffeine use one per day   Social Determinants of Health   Financial Resource Strain: Not on file  Food Insecurity: Not on file  Transportation Needs: Not on file  Physical Activity: Not on file  Stress: Not on file  Social Connections: Not on file  Intimate Partner Violence: Not on file    Outpatient Medications Prior to Visit  Medication Sig Dispense Refill   aspirin EC 81 MG tablet Take 81 mg by mouth daily with lunch. Swallow whole.     bacitracin 500 UNIT/GM ointment Apply topically 2 (two) times daily. 15 g 0  CALCIUM PO Take 1 tablet by mouth every other day.     chlorhexidine (PERIDEX) 0.12 % solution Use as directed 10 mLs in the mouth or throat 4 (four) times daily. 120 mL 2   Cholecalciferol (VITAMIN D3 PO) Take 1 tablet by mouth daily with lunch.     COVID-19 mRNA Vac-TriS, Pfizer, (PFIZER-BIONT COVID-19 VAC-TRIS) SUSP injection Inject into the muscle. 0.3 mL 0   docusate sodium (COLACE) 100 MG capsule Take 200 mg by mouth daily.     doxycycline (VIBRA-TABS) 100 MG tablet Take 1 tablet (100 mg total) by mouth 2 (two) times daily. 20 tablet 0   fluticasone (FLONASE) 50 MCG/ACT nasal spray Place 2 sprays into both nostrils daily. 16 g 6   folic acid (FOLVITE) 1 MG tablet Take 1 tablet (1 mg total) by mouth every morning. 30 tablet 6   furosemide (LASIX) 20 MG tablet Take 1 tablet (20 mg total) by mouth daily. (Patient taking differently: Take 20 mg by mouth daily as  needed for fluid or edema.) 90 tablet 1   gemfibrozil (LOPID) 600 MG tablet TAKE 1 TABLET BY MOUTH EVERY DAY 90 tablet 0   Glucosamine-Chondroitin (OSTEO BI-FLEX REGULAR STRENGTH PO) Take 1 tablet by mouth every morning.     guaiFENesin (MUCINEX) 600 MG 12 hr tablet Take 600 mg by mouth every other day.     levothyroxine (SYNTHROID) 100 MCG tablet TAKE 1 TABLET BY MOUTH DAILY BEFORE BREAKFAST. 90 tablet 1   loratadine (CLARITIN) 10 MG tablet Take 10 mg by mouth daily.     losartan-hydrochlorothiazide (HYZAAR) 50-12.5 MG tablet TAKE 1 TABLET BY MOUTH EVERY DAY 90 tablet 0   magic mouthwash (lidocaine, diphenhydrAMINE, alum & mag hydroxide) suspension 5 ml po qid swish and spit (Patient taking differently: Swish and spit 5 mLs 4 (four) times daily as needed for mouth pain.) 200 mL 0   mirabegron ER (MYRBETRIQ) 25 MG TB24 tablet Take 25 mg by mouth at bedtime.     nystatin (MYCOSTATIN) 100000 UNIT/ML suspension Take 5 mLs (500,000 Units total) by mouth 4 (four) times daily. 60 mL 2   polyvinyl alcohol (LIQUIFILM TEARS) 1.4 % ophthalmic solution Place 1 drop into both eyes daily as needed for dry eyes.     traMADol (ULTRAM) 50 MG tablet Take 0.5 tablets (25 mg total) by mouth every 6 (six) hours as needed for severe pain. 30 tablet 0   traZODone (DESYREL) 50 MG tablet Take 1 tablet (50 mg total) by mouth at bedtime. 30 tablet 0   No facility-administered medications prior to visit.    Allergies  Allergen Reactions   Methotrexate Derivatives Other (See Comments)    Mouth sores   Asa [Aspirin] Other (See Comments)    Gastric symptoms - can tolerate 81 mg aspirin   Penicillin G Rash    Did it involve swelling of the face/tongue/throat, SOB, or low BP? Unknown Did it involve sudden or severe rash/hives, skin peeling, or any reaction on the inside of your mouth or nose? Unknown Did you need to seek medical attention at a hospital or doctor's office? Unknown When did it last happen?   unknown    If  all above answers are "NO", may proceed with cephalosporin use.    Review of Systems  Constitutional:  Negative for fever.  HENT:  Negative for congestion, ear pain, hearing loss, sinus pain and sore throat.   Eyes:  Negative for blurred vision and pain.  Respiratory:  Negative for cough,  sputum production, shortness of breath and wheezing.   Cardiovascular:  Negative for chest pain and palpitations.  Gastrointestinal:  Negative for blood in stool, constipation, diarrhea, nausea and vomiting.  Genitourinary:  Negative for dysuria, frequency, hematuria and urgency.  Musculoskeletal:  Negative for back pain, falls and myalgias.  Neurological:  Negative for dizziness, sensory change, loss of consciousness, weakness and headaches.  Endo/Heme/Allergies:  Negative for environmental allergies. Does not bruise/bleed easily.  Psychiatric/Behavioral:  Negative for depression and suicidal ideas. The patient is not nervous/anxious and does not have insomnia.       Objective:    Physical Exam Constitutional:      General: He is not in acute distress.    Appearance: Normal appearance. He is not ill-appearing.  HENT:     Head: Normocephalic and atraumatic.     Right Ear: Tympanic membrane, ear canal and external ear normal.     Left Ear: Tympanic membrane, ear canal and external ear normal.  Eyes:     Pupils: Pupils are equal, round, and reactive to light.  Cardiovascular:     Rate and Rhythm: Normal rate and regular rhythm.     Pulses: Normal pulses.     Heart sounds: No murmur heard.   No gallop.  Pulmonary:     Effort: Pulmonary effort is normal. No respiratory distress.     Breath sounds: Normal breath sounds. No wheezing or rhonchi.  Abdominal:     General: Bowel sounds are normal. There is no distension.     Palpations: Abdomen is soft.     Tenderness: There is no abdominal tenderness. There is no guarding.     Hernia: No hernia is present.  Musculoskeletal:     Cervical back: Neck  supple.  Lymphadenopathy:     Cervical: No cervical adenopathy.  Skin:    General: Skin is warm and dry.  Neurological:     Mental Status: He is alert and oriented to person, place, and time.    There were no vitals taken for this visit. Wt Readings from Last 3 Encounters:  09/07/20 121 lb 12.8 oz (55.2 kg)  08/09/20 125 lb (56.7 kg)  08/06/20 135 lb (61.2 kg)    Diabetic Foot Exam - Simple   No data filed    Lab Results  Component Value Date   WBC 2.5 (L) 08/13/2020   HGB 10.9 (L) 08/13/2020   HCT 32.6 (L) 08/13/2020   PLT 113 (L) 08/13/2020   GLUCOSE 108 (H) 08/13/2020   CHOL 151 04/13/2020   TRIG 155.0 (H) 04/13/2020   HDL 42.90 04/13/2020   LDLDIRECT 74.9 04/11/2011   LDLCALC 77 04/13/2020   ALT 24 08/07/2020   AST 33 08/07/2020   NA 137 08/13/2020   K 3.7 08/13/2020   CL 110 08/13/2020   CREATININE 1.08 08/13/2020   BUN 9 08/13/2020   CO2 20 (L) 08/13/2020   TSH 1.90 04/13/2020   PSA 3.76 04/11/2011   INR 1.1 08/07/2020   HGBA1C 6.3 (H) 12/11/2017    Lab Results  Component Value Date   TSH 1.90 04/13/2020   Lab Results  Component Value Date   WBC 2.5 (L) 08/13/2020   HGB 10.9 (L) 08/13/2020   HCT 32.6 (L) 08/13/2020   MCV 89.3 08/13/2020   PLT 113 (L) 08/13/2020   Lab Results  Component Value Date   NA 137 08/13/2020   K 3.7 08/13/2020   CO2 20 (L) 08/13/2020   GLUCOSE 108 (H) 08/13/2020   BUN  9 08/13/2020   CREATININE 1.08 08/13/2020   BILITOT 0.6 08/07/2020   ALKPHOS 67 08/07/2020   AST 33 08/07/2020   ALT 24 08/07/2020   PROT 8.4 (H) 08/07/2020   ALBUMIN 4.0 08/07/2020   CALCIUM 8.3 (L) 08/13/2020   ANIONGAP 7 08/13/2020   EGFR 55 (L) 08/06/2020   GFR 50.44 (L) 04/13/2020   Lab Results  Component Value Date   CHOL 151 04/13/2020   Lab Results  Component Value Date   HDL 42.90 04/13/2020   Lab Results  Component Value Date   LDLCALC 77 04/13/2020   Lab Results  Component Value Date   TRIG 155.0 (H) 04/13/2020   Lab  Results  Component Value Date   CHOLHDL 4 04/13/2020   Lab Results  Component Value Date   HGBA1C 6.3 (H) 12/11/2017        Dexa: Last completed on 11/09/2010. Result showed he is osteopenic according to the [facility].  PSA: Colonoscopy: Last completed in 07/22/2013. Results showed internal hemorrhoids otherwise exam was normal.  Assessment & Plan:   Problem List Items Addressed This Visit   None    No orders of the defined types were placed in this encounter.   I,Zite Okoli,acting as a Education administrator for [employer], DO.,have documented all relevant documentation on the behalf of [name], DO,as directed by  [name], DO while in the presence of [name], DO.   I, [name] DO., personally preformed the services described in this documentation.  All medical record entries made by the scribe were at my direction and in my presence.  I have reviewed the chart and discharge instructions (if applicable) and agree that the record reflects my personal performance and is accurate and complete. 02/25/2021

## 2021-02-27 DIAGNOSIS — R42 Dizziness and giddiness: Secondary | ICD-10-CM | POA: Diagnosis not present

## 2021-02-27 DIAGNOSIS — I959 Hypotension, unspecified: Secondary | ICD-10-CM | POA: Diagnosis not present

## 2021-03-01 ENCOUNTER — Other Ambulatory Visit: Payer: Self-pay

## 2021-03-01 ENCOUNTER — Ambulatory Visit (INDEPENDENT_AMBULATORY_CARE_PROVIDER_SITE_OTHER): Payer: Medicare Other | Admitting: Family Medicine

## 2021-03-01 ENCOUNTER — Encounter: Payer: Self-pay | Admitting: Family Medicine

## 2021-03-01 VITALS — BP 106/60 | HR 72 | Temp 98.1°F | Resp 18 | Ht 60.0 in | Wt 116.6 lb

## 2021-03-01 DIAGNOSIS — I10 Essential (primary) hypertension: Secondary | ICD-10-CM

## 2021-03-01 DIAGNOSIS — D649 Anemia, unspecified: Secondary | ICD-10-CM

## 2021-03-01 DIAGNOSIS — Z0000 Encounter for general adult medical examination without abnormal findings: Secondary | ICD-10-CM

## 2021-03-01 DIAGNOSIS — E039 Hypothyroidism, unspecified: Secondary | ICD-10-CM | POA: Diagnosis not present

## 2021-03-01 MED ORDER — LEVOTHYROXINE SODIUM 100 MCG PO TABS: 100.0000 ug | ORAL_TABLET | Freq: Every day | ORAL | 1 refills | Status: DC

## 2021-03-01 MED ORDER — LOSARTAN POTASSIUM 50 MG PO TABS
50.0000 mg | ORAL_TABLET | Freq: Every day | ORAL | 1 refills | Status: DC
Start: 2021-03-01 — End: 2021-06-02

## 2021-03-01 NOTE — Assessment & Plan Note (Signed)
Pt has been off the synthroid  restasrt meds Recheck in 2 months Lab Results  Component Value Date   TSH 13.57 (H) 02/25/2021

## 2021-03-01 NOTE — Assessment & Plan Note (Signed)
Labs reviewed with daughter and pt Pt will wait on flu shot until after covid vaccine--- he will get that tomorrow

## 2021-03-01 NOTE — Progress Notes (Signed)
Subjective:   By signing my name below, I, [name], attest that this documentation has been prepared under the direction and in the presence of Dr. [name], DO. 03/01/2021    Patient ID: [name], male    DOB: 1928-10-18, 87 y.o.   MRN: [id number]  Chief Complaint  Patient presents with   Annual Exam    HPI Patient is in today for a comprehensive physical exam. His daughter is present with him during this visit to assist with transportation and translation.  He complains of feeling tired for the past 3 weeks. He thinks it may be due to his age.  He also complains of constipation. He is taking colace stool softener 3x daily and finds no relief. He reports his last bowel movement was this afternoon and it was hard.  His blood pressure is low during this visit. He does not check his blood pressure regularly at home. His last blood work showed mild anemia. He is only taking vitamin D3 supplements at this time. He continues taking 50-12.5 mg Hyzaar daily PO.   BP Readings from Last 3 Encounters:  03/01/21 106/60  09/07/20 128/60  08/13/20 (!) 144/64   Pulse Readings from Last 3 Encounters:  03/01/21 72  09/07/20 66  08/13/20 60   His cholesterol levels were good during his last lipid panel.   Lab Results  Component Value Date   CHOL 166 02/25/2021   HDL 56.30 02/25/2021   LDLCALC 98 02/25/2021   LDLDIRECT 74.9 04/11/2011   TRIG 60.0 02/25/2021   CHOLHDL 3 02/25/2021   His daughter reports that he does not regularly take his 100 mcg synthroid daily PO. She thinks this is the reason for him feeling tired. She also reports that he has lost 5 lb's this month and that he is urinating more frequently. He is requesting a refill on 100 mg mcg daily PO.  He denies having any fever, new moles, congestion, sore throat, muscle pain, joint pain, chest pain, cough, SOB, wheezing, n/v/d, blood in stool, dysuria, frequency, hematuria, or headaches at this time. He is  interested in getting the flu vaccine at a later date. He is receiving the bivalent Covid-19 booster vaccine tomorrow.    Past Medical History:  Diagnosis Date   Adenocarcinoma ([place]) 1985   colon s/p right hemicolectomy and chemotherapy. F/U with cancer center and Dr. Lucia Gaskins rotinely   Depression    Eczema    Epistaxis    f/u per ENT   Erectile dysfunction    Gastric ulcer 12/11   H-Pylori Tx, EGD 3-12: gastritis   Hay fever    Head injury 11/2017   with fall   Hyperlipemia    Hypertension    Hypothyroidism    Hypothyroid   Insomnia    Osteopenia     per DEXA 07-2008 (Rx fosamax)   Rheumatoid arthritis ([place])    Stroke ([place])     " balance issue"    Past Surgical History:  Procedure Laterality Date   COLON SURGERY     Right / Adenocarcinoma / Chemo   COLONOSCOPY  2005, 2008   Dr. Lucia Gaskins   INGUINAL HERNIA REPAIR     Right   IR [place] SEL COM CAROTID INNOMINATE BILAT MOD SED  12/13/2017   IR ANGIO VERTEBRAL SEL VERTEBRAL BILAT MOD SED  12/13/2017   IR PTA INTRACRANIAL  12/18/2017   RADIOLOGY WITH ANESTHESIA N/A 12/18/2017   Procedure: Angioplasty with stenting;  Surgeon: Luanne Bras,  MD;  Location: [facility];  Service: Radiology;  Laterality: N/A;    Family History  Problem Relation Age of Onset   Diabetes Neg Hx    Heart attack Neg Hx    Colon cancer Neg Hx    Prostate cancer Neg Hx     Social History   Socioeconomic History   Marital status: Married    Spouse name: Not on file   Number of children: 2   Years of education: Not on file   Highest education level: Not on file  Occupational History    Employer: RETIRED  Tobacco Use   Smoking status: Never   Smokeless tobacco: Never  Vaping Use   Vaping Use: Never used  Substance and Sexual Activity   Alcohol use: No    Alcohol/week: 0.0 standard drinks   Drug use: No   Sexual activity: Not on file  Other Topics Concern   Not on file  Social History Narrative   Original from [place]    Vegetarian   Daily caffeine use one per day   Social Determinants of Health   Financial Resource Strain: Not on file  Food Insecurity: Not on file  Transportation Needs: Not on file  Physical Activity: Not on file  Stress: Not on file  Social Connections: Not on file  Intimate Partner Violence: Not on file    Outpatient Medications Prior to Visit  Medication Sig Dispense Refill   aspirin EC 81 MG tablet Take 81 mg by mouth daily with lunch. Swallow whole.     bacitracin 500 UNIT/GM ointment Apply topically 2 (two) times daily. 15 g 0   CALCIUM PO Take 1 tablet by mouth every other day.     chlorhexidine (PERIDEX) 0.12 % solution Use as directed 10 mLs in the mouth or throat 4 (four) times daily. 120 mL 2   Cholecalciferol (VITAMIN D3 PO) Take 1 tablet by mouth daily with lunch.     COVID-19 mRNA Vac-TriS, Pfizer, (PFIZER-BIONT COVID-19 VAC-TRIS) SUSP injection Inject into the muscle. 0.3 mL 0   docusate sodium (COLACE) 100 MG capsule Take 200 mg by mouth daily.     doxycycline (VIBRA-TABS) 100 MG tablet Take 1 tablet (100 mg total) by mouth 2 (two) times daily. 20 tablet 0   fluticasone (FLONASE) 50 MCG/ACT nasal spray Place 2 sprays into both nostrils daily. 16 g 6   folic acid (FOLVITE) 1 MG tablet Take 1 tablet (1 mg total) by mouth every morning. 30 tablet 6   furosemide (LASIX) 20 MG tablet Take 1 tablet (20 mg total) by mouth daily. (Patient taking differently: Take 20 mg by mouth daily as needed for fluid or edema.) 90 tablet 1   gemfibrozil (LOPID) 600 MG tablet TAKE 1 TABLET BY MOUTH EVERY DAY 90 tablet 0   Glucosamine-Chondroitin (OSTEO BI-FLEX REGULAR STRENGTH PO) Take 1 tablet by mouth every morning.     guaiFENesin (MUCINEX) 600 MG 12 hr tablet Take 600 mg by mouth every other day.     loratadine (CLARITIN) 10 MG tablet Take 10 mg by mouth daily.     magic mouthwash (lidocaine, diphenhydrAMINE, alum & mag hydroxide) suspension 5 ml po qid swish and spit (Patient taking  differently: Swish and spit 5 mLs 4 (four) times daily as needed for mouth pain.) 200 mL 0   mirabegron ER (MYRBETRIQ) 25 MG TB24 tablet Take 25 mg by mouth at bedtime.     nystatin (MYCOSTATIN) 100000 UNIT/ML suspension Take 5 mLs (500,000 Units total)  by mouth 4 (four) times daily. 60 mL 2   polyvinyl alcohol (LIQUIFILM TEARS) 1.4 % ophthalmic solution Place 1 drop into both eyes daily as needed for dry eyes.     traMADol (ULTRAM) 50 MG tablet Take 0.5 tablets (25 mg total) by mouth every 6 (six) hours as needed for severe pain. 30 tablet 0   traZODone (DESYREL) 50 MG tablet Take 1 tablet (50 mg total) by mouth at bedtime. 30 tablet 0   levothyroxine (SYNTHROID) 100 MCG tablet TAKE 1 TABLET BY MOUTH DAILY BEFORE BREAKFAST. 90 tablet 1   losartan-hydrochlorothiazide (HYZAAR) 50-12.5 MG tablet TAKE 1 TABLET BY MOUTH EVERY DAY 90 tablet 0   No facility-administered medications prior to visit.    Allergies  Allergen Reactions   Methotrexate Derivatives Other (See Comments)    Mouth sores   Asa [Aspirin] Other (See Comments)    Gastric symptoms - can tolerate 81 mg aspirin   Penicillin G Rash    Did it involve swelling of the face/tongue/throat, SOB, or low BP? Unknown Did it involve sudden or severe rash/hives, skin peeling, or any reaction on the inside of your mouth or nose? Unknown Did you need to seek medical attention at a hospital or doctor's office? Unknown When did it last happen?   unknown    If all above answers are "NO", may proceed with cephalosporin use.    Review of Systems  Constitutional:  Positive for malaise/fatigue. Negative for fever.  HENT:  Negative for congestion and sore throat.   Respiratory:  Negative for cough, shortness of breath and wheezing.   Cardiovascular:  Negative for chest pain.  Gastrointestinal:  Positive for constipation. Negative for blood in stool, diarrhea, nausea and vomiting.  Genitourinary:  Negative for dysuria, frequency and hematuria.   Musculoskeletal:  Negative for joint pain and myalgias.  Skin:        (-)New moles  Neurological:  Negative for headaches.      Objective:    Physical Exam Constitutional:      General: He is not in acute distress.    Appearance: Normal appearance. He is not ill-appearing.  HENT:     Head: Normocephalic and atraumatic.     Right Ear: Tympanic membrane, ear canal and external ear normal.     Left Ear: Tympanic membrane, ear canal and external ear normal.  Eyes:     Extraocular Movements: Extraocular movements intact.     Pupils: Pupils are equal, round, and reactive to light.  Cardiovascular:     Rate and Rhythm: Normal rate and regular rhythm.     Heart sounds: Normal heart sounds. No murmur heard.   No gallop.  Pulmonary:     Effort: Pulmonary effort is normal. No respiratory distress.     Breath sounds: Normal breath sounds. No wheezing or rales.  Abdominal:     General: Bowel sounds are normal. There is no distension.     Palpations: Abdomen is soft.     Tenderness: There is no abdominal tenderness. There is no guarding.  Skin:    General: Skin is warm and dry.  Neurological:     Mental Status: He is alert and oriented to person, place, and time.  Psychiatric:        Behavior: Behavior normal.    BP 106/60 (BP Location: Left Arm, Patient Position: Sitting, Cuff Size: Normal)   Pulse 72   Temp 98.1 F (36.7 C) (Oral)   Resp 18   Ht 5' (1.524 m)  Wt 116 lb 9.6 oz (52.9 kg)   SpO2 97%   BMI 22.77 kg/m  Wt Readings from Last 3 Encounters:  03/01/21 116 lb 9.6 oz (52.9 kg)  09/07/20 121 lb 12.8 oz (55.2 kg)  08/09/20 125 lb (56.7 kg)    Diabetic Foot Exam - Simple   No data filed    Lab Results  Component Value Date   WBC 7.3 02/25/2021   HGB 11.8 (L) 02/25/2021   HCT 36.2 (L) 02/25/2021   PLT 237.0 02/25/2021   GLUCOSE 87 02/25/2021   CHOL 166 02/25/2021   TRIG 60.0 02/25/2021   HDL 56.30 02/25/2021   LDLDIRECT 74.9 04/11/2011   LDLCALC 98  02/25/2021   ALT 12 02/25/2021   AST 24 02/25/2021   NA 142 02/25/2021   K 4.2 02/25/2021   CL 105 02/25/2021   CREATININE 1.75 (H) 02/25/2021   BUN 45 (H) 02/25/2021   CO2 28 02/25/2021   TSH 13.57 (H) 02/25/2021   PSA 3.76 04/11/2011   INR 1.1 08/07/2020   HGBA1C 6.3 (H) 12/11/2017    Lab Results  Component Value Date   TSH 13.57 (H) 02/25/2021   Lab Results  Component Value Date   WBC 7.3 02/25/2021   HGB 11.8 (L) 02/25/2021   HCT 36.2 (L) 02/25/2021   MCV 92.2 02/25/2021   PLT 237.0 02/25/2021   Lab Results  Component Value Date   NA 142 02/25/2021   K 4.2 02/25/2021   CO2 28 02/25/2021   GLUCOSE 87 02/25/2021   BUN 45 (H) 02/25/2021   CREATININE 1.75 (H) 02/25/2021   BILITOT 0.3 02/25/2021   ALKPHOS 96 02/25/2021   AST 24 02/25/2021   ALT 12 02/25/2021   PROT 7.5 02/25/2021   ALBUMIN 4.3 02/25/2021   CALCIUM 10.0 02/25/2021   ANIONGAP 7 08/13/2020   EGFR 55 (L) 08/06/2020   GFR 33.80 (L) 02/25/2021   Lab Results  Component Value Date   CHOL 166 02/25/2021   Lab Results  Component Value Date   HDL 56.30 02/25/2021   Lab Results  Component Value Date   LDLCALC 98 02/25/2021   Lab Results  Component Value Date   TRIG 60.0 02/25/2021   Lab Results  Component Value Date   CHOLHDL 3 02/25/2021   Lab Results  Component Value Date   HGBA1C 6.3 (H) 12/11/2017   Dexa- Last completed 11/27/2010.  Colonoscopy- Last completed 07/22/2013. Results showed small polyp.      Assessment & Plan:   Problem List Items Addressed This Visit       Unprioritized   Hypothyroidism - Primary (Chronic)    Pt has been off the synthroid  restasrt meds Recheck in 2 months Lab Results  Component Value Date   TSH 13.57 (H) 02/25/2021         Relevant Medications   levothyroxine (SYNTHROID) 100 MCG tablet   Other Relevant Orders   AMB Referral to [facility]   Thyroid Panel With TSH   Preventative health care    Labs reviewed with  daughter and pt Pt will wait on flu shot until after covid vaccine--- he will get that tomorrow        Other Visit Diagnoses     Primary hypertension       Relevant Medications   losartan (COZAAR) 50 MG tablet   Other Relevant Orders   AMB Referral to [facility]   Anemia, unspecified type       Relevant Orders  CBC with Differential/Platelet   IBC panel        Meds ordered this encounter  Medications   losartan (COZAAR) 50 MG tablet    Sig: Take 1 tablet (50 mg total) by mouth daily.    Dispense:  90 tablet    Refill:  1   levothyroxine (SYNTHROID) 100 MCG tablet    Sig: Take 1 tablet (100 mcg total) by mouth daily before breakfast.    Dispense:  90 tablet    Refill:  1    I, Dr. [name], DO, personally preformed the services described in this documentation.  All medical record entries made by the scribe were at my direction and in my presence.  I have reviewed the chart and discharge instructions (if applicable) and agree that the record reflects my personal performance and is accurate and complete. 03/01/2021   I,[name],acting as a scribe for [name], DO.,have documented all relevant documentation on the behalf of [name], DO,as directed by  [name], DO while in the presence of [name], DO.   [name], DO

## 2021-03-01 NOTE — Patient Instructions (Signed)
Preventive Care 95 Years and Older, Male Preventive care refers to lifestyle choices and visits with your health care provider that can promote health and wellness. This includes: A yearly physical exam. This is also called an annual wellness visit. Regular dental and eye exams. Immunizations. Screening for certain conditions. Healthy lifestyle choices, such as: Eating a healthy diet. Getting regular exercise. Not using drugs or products that contain nicotine and tobacco. Limiting alcohol use. What can I expect for my preventive care visit? Physical exam Your health care provider will check your: Height and weight. These may be used to calculate your BMI (body mass index). BMI is a measurement that tells if you are at a healthy weight. Heart rate and blood pressure. Body temperature. Skin for abnormal spots. Counseling Your health care provider may ask you questions about your: Past medical problems. Family's medical history. Alcohol, tobacco, and drug use. Emotional well-being. Home life and relationship well-being. Sexual activity. Diet, exercise, and sleep habits. History of falls. Memory and ability to understand (cognition). Work and work Statistician. Access to firearms. What immunizations do I need? Vaccines are usually given at various ages, according to a schedule. Your health care provider will recommend vaccines for you based on your age, medical history, and lifestyle or other factors, such as travel or where you work. What tests do I need? Blood tests Lipid and cholesterol levels. These may be checked every 5 years, or more often depending on your overall health. Hepatitis C test. Hepatitis B test. Screening Lung cancer screening. You may have this screening every year starting at age 40 if you have a 30-pack-year history of smoking and currently smoke or have quit within the past 15 years. Colorectal cancer screening. All adults should have this screening  starting at age 44 and continuing until age 61. Your health care provider may recommend screening at age 35 if you are at increased risk. You will have tests every 1-10 years, depending on your results and the type of screening test. Prostate cancer screening. Recommendations will vary depending on your family history and other risks. Genital exam to check for testicular cancer or hernias. Diabetes screening. This is done by checking your blood sugar (glucose) after you have not eaten for a while (fasting). You may have this done every 1-3 years. Abdominal aortic aneurysm (AAA) screening. You may need this if you are a current or former smoker. STD (sexually transmitted disease) testing, if you are at risk. Follow these instructions at home: Eating and drinking  Eat a diet that includes fresh fruits and vegetables, whole grains, lean protein, and low-fat dairy products. Limit your intake of foods with high amounts of sugar, saturated fats, and salt. Take vitamin and mineral supplements as recommended by your health care provider. Do not drink alcohol if your health care provider tells you not to drink. If you drink alcohol: Limit how much you have to 0-2 drinks a day. Be aware of how much alcohol is in your drink. In the [place] one drink equals one 12 oz bottle of beer (355 mL), one 5 oz glass of wine (148 mL), or one 1 oz glass of hard liquor (44 mL). Lifestyle Take daily care of your teeth and gums. Brush your teeth every morning and night with fluoride toothpaste. Floss one time each day. Stay active. Exercise for at least 30 minutes 5 or more days each week. Do not use any products that contain nicotine or tobacco, such as cigarettes, e-cigarettes, and chewing tobacco. If  you need help quitting, ask your health care provider. Do not use drugs. If you are sexually active, practice safe sex. Use a condom or other form of protection to prevent STIs (sexually transmitted infections). Talk  with your health care provider about taking a low-dose aspirin or statin. Find healthy ways to cope with stress, such as: Meditation, yoga, or listening to music. Journaling. Talking to a trusted person. Spending time with friends and family. Safety Always wear your seat belt while driving or riding in a vehicle. Do not drive: If you have been drinking alcohol. Do not ride with someone who has been drinking. When you are tired or distracted. While texting. Wear a helmet and other protective equipment during sports activities. If you have firearms in your house, make sure you follow all gun safety procedures. What's next? Visit your health care provider once a year for an annual wellness visit. Ask your health care provider how often you should have your eyes and teeth checked. Stay up to date on all vaccines. This information is not intended to replace advice given to you by your health care provider. Make sure you discuss any questions you have with your health care provider. Document Revised: 07/16/2020 Document Reviewed: 05/02/2018 Elsevier Patient Education  2022 [employer].

## 2021-03-02 ENCOUNTER — Telehealth: Payer: Self-pay | Admitting: *Deleted

## 2021-03-02 NOTE — Chronic Care Management (AMB) (Signed)
  Chronic Care Management   Note  03/02/2021 Name: [name] MRN: [id number] DOB: 1928-10-27  [name] is a 87 y.o. year old male who is a primary care patient of [name], DO. I reached out to Baron Sane by phone today in response to a referral sent by [name] PCP.  [name] was given information about Chronic Care Management services today including:  CCM service includes personalized support from designated clinical staff supervised by his physician, including individualized plan of care and coordination with other care providers 24/7 contact phone numbers for assistance for urgent and routine care needs. Service will only be billed when office clinical staff spend 20 minutes or more in a month to coordinate care. Only one practitioner may furnish and bill the service in a calendar month. The patient may stop CCM services at any time (effective at the end of the month) by phone call to the office staff. The patient is responsible for co-pay (up to 20% after annual deductible is met) if co-pay is required by the individual health plan.   Patient agreed to services and verbal consent obtained.   Follow up plan: Telephone appointment with care management team member scheduled for: 03/07/2021  Julian Hy, [facility]  Direct Dial: [phone]

## 2021-03-03 ENCOUNTER — Other Ambulatory Visit: Payer: Medicare Other

## 2021-03-03 DIAGNOSIS — Z125 Encounter for screening for malignant neoplasm of prostate: Secondary | ICD-10-CM

## 2021-03-03 LAB — PSA: PSA: 2.76 ng/mL (ref <=4.00)

## 2021-03-03 NOTE — Addendum Note (Signed)
Addended by: Octavio Manns E on: 03/03/2021 10:51 AM   Modules accepted: Orders

## 2021-03-07 ENCOUNTER — Other Ambulatory Visit: Payer: Self-pay | Admitting: Family Medicine

## 2021-03-07 ENCOUNTER — Telehealth: Payer: Self-pay | Admitting: Pharmacist

## 2021-03-07 ENCOUNTER — Ambulatory Visit (INDEPENDENT_AMBULATORY_CARE_PROVIDER_SITE_OTHER): Payer: Medicare Other | Admitting: Pharmacist

## 2021-03-07 DIAGNOSIS — E785 Hyperlipidemia, unspecified: Secondary | ICD-10-CM

## 2021-03-07 DIAGNOSIS — M25519 Pain in unspecified shoulder: Secondary | ICD-10-CM

## 2021-03-07 DIAGNOSIS — N289 Disorder of kidney and ureter, unspecified: Secondary | ICD-10-CM

## 2021-03-07 DIAGNOSIS — E039 Hypothyroidism, unspecified: Secondary | ICD-10-CM

## 2021-03-07 DIAGNOSIS — E559 Vitamin D deficiency, unspecified: Secondary | ICD-10-CM

## 2021-03-07 DIAGNOSIS — R413 Other amnesia: Secondary | ICD-10-CM

## 2021-03-07 DIAGNOSIS — G8929 Other chronic pain: Secondary | ICD-10-CM

## 2021-03-07 DIAGNOSIS — I10 Essential (primary) hypertension: Secondary | ICD-10-CM

## 2021-03-07 MED ORDER — TRAMADOL HCL 50 MG PO TABS: 25.0000 mg | ORAL_TABLET | Freq: Four times a day (QID) | ORAL | 0 refills | Status: DC | PRN

## 2021-03-07 NOTE — Chronic Care Management (AMB) (Signed)
Chronic Care Management Pharmacy Note  03/12/2021 Name:  [name] MRN:  [id number] DOB:  1929/03/28  Summary: Discussed medication adherence. Patient has not been taking levothyroxine or aspirin regularly  Also continues to have occasional shoulder pain. Uses tramadol as needed - needs refills. Last refill was #30 08/13/2020 Took vitamin D supplement in past but no longer taking. Last serum vitamin D was WNL.   Recommendations/Changes made from today's visit: Recommended purchase pill container with alarms.  Restart aspirin 31m daily Discussed levothyroxine adherence - recheck TSH in 2 months Consider rechecking vitamin D as well.   Subjective: Christian Holleranis an 87y.o. year old male who is a primary patient of [name] DO.  The CCM team was consulted for assistance with disease management and care coordination needs.    Engaged with patient's daughter by telephone for initial visit in response to provider referral for pharmacy case management and/or care coordination services.   Consent to Services:  The patient was given the following information about Chronic Care Management services today, agreed to services, and gave verbal consent: 1. CCM service includes personalized support from designated clinical staff supervised by the primary care provider, including individualized plan of care and coordination with other care providers 2. 24/7 contact phone numbers for assistance for urgent and routine care needs. 3. Service will only be billed when office clinical staff spend 20 minutes or more in a month to coordinate care. 4. Only one practitioner may furnish and bill the service in a calendar month. 5.The patient may stop CCM services at any time (effective at the end of the month) by phone call to the office staff. 6. The patient will be responsible for cost sharing (co-pay) of up to 20% of the service fee (after annual deductible is met). Patient agreed to services and  consent obtained.  Patient Care Team: [name] YAlferd Apa DO as PCP - General (Family Medicine) ECherre Robins PharmD (Pharmacist)  Recent office visits: 03/01/2021 - PCP (Dr LEtter Sjogren [facility] Visit. Changed losartan HCTZ to just losartan 560mdaily due to fatigue, low BP and incresaed urination. Low adhernece to thyroid replacement therapy. TSH was 13.57. Restarted levothyroxine 10071mdaily   09/07/2020 - PCP (Dr LowEtter Sjogrencute visit for pansinusitis - prescribed doxycycline 100m42mice a day for 10 days and fluticsone nasal spray   Recent consult visits: None in last 6 months  Hospital visits: None in previous 6 months  Objective:  Lab Results  Component Value Date   CREATININE 1.75 (H) 02/25/2021   CREATININE 1.08 08/13/2020   CREATININE 1.24 08/12/2020    Lab Results  Component Value Date   HGBA1C 6.3 (H) 12/11/2017   Last diabetic Eye exam: No results found for: HMDIABEYEEXA  Last diabetic Foot exam: No results found for: HMDIABFOOTEX      Component Value Date/Time   CHOL 166 02/25/2021 1059   TRIG 60.0 02/25/2021 1059   HDL 56.30 02/25/2021 1059   CHOLHDL 3 02/25/2021 1059   VLDL 12.0 02/25/2021 1059   LDLCALC 98 02/25/2021 1059   LDLDIRECT 74.9 04/11/2011 0933    Hepatic Function Latest Ref Rng & Units 02/25/2021 08/07/2020 08/06/2020  Total Protein 6.0 - 8.3 g/dL 7.5 8.4(H) 7.6  Albumin 3.5 - 5.2 g/dL 4.3 4.0 4.5  AST 0 - 37 U/L 24 33 26  ALT 0 - 53 U/L _0 Alk Phosphatase 39 - 117 U/L 96 67 96  Total Bilirubin 0.2 - 1.2  mg/dL 0.3 0.6 0.5  Bilirubin, Direct 0.0 - 0.3 mg/dL - - -    Lab Results  Component Value Date/Time   TSH 13.57 (H) 02/25/2021 10:59 AM   TSH 1.90 04/13/2020 02:11 PM    CBC Latest Ref Rng & Units 02/25/2021 08/13/2020 08/12/2020  WBC 4.0 - 10.5 K/uL 7.3 2.5(L) 2.2(L)  Hemoglobin 13.0 - 17.0 g/dL 11.8(L) 10.9(L) 9.4(L)  Hematocrit 39.0 - 52.0 % 36.2(L) 32.6(L) 27.9(L)  Platelets 150.0 - 400.0 K/uL 237.0 113(L) 87(L)     Lab Results  Component Value Date/Time   VD25OH 49.40 04/13/2020 02:11 PM   VD25OH 43 11/15/2009 09:25 PM    Clinical ASCVD: Yes  The ASCVD Risk score (Arnett DK, et al., 2019) failed to calculate for the following reasons:   The 2019 ASCVD risk score is only valid for ages 56 to 85     Social History   Tobacco Use  Smoking Status Never  Smokeless Tobacco Never   BP Readings from Last 3 Encounters:  03/01/21 106/60  09/07/20 128/60  08/13/20 (!) 144/64   Pulse Readings from Last 3 Encounters:  03/01/21 72  09/07/20 66  08/13/20 60   Wt Readings from Last 3 Encounters:  03/01/21 116 lb 9.6 oz (52.9 kg)  09/07/20 121 lb 12.8 oz (55.2 kg)  08/09/20 125 lb (56.7 kg)    Assessment: Review of patient past medical history, allergies, medications, health status, including review of consultants reports, laboratory and other test data, was performed as part of comprehensive evaluation and provision of chronic care management services.   SDOH:  (Social Determinants of Health) assessments and interventions performed:  SDOH Interventions    Flowsheet Row Most Recent Value  SDOH Interventions   Financial Strain Interventions Intervention Not Indicated       CCM Care Plan  Allergies  Allergen Reactions   Methotrexate Derivatives Other (See Comments)    Mouth sores / pancytopenia   Asa [Aspirin] Other (See Comments)    Gastric symptoms - can tolerate 81 mg aspirin   Penicillin G Rash    Did it involve swelling of the face/tongue/throat, SOB, or low BP? Unknown Did it involve sudden or severe rash/hives, skin peeling, or any reaction on the inside of your mouth or nose? Unknown Did you need to seek medical attention at a hospital or doctor's office? Unknown When did it last happen?   unknown    If all above answers are "NO", may proceed with cephalosporin use.    Medications Reviewed Today     Reviewed by Cherre Robins, PharmD (Pharmacist) on 03/12/21 at 8  Med  List Status: <None>   Medication Order Taking? Sig Documenting Provider Last Dose Status Informant  aspirin EC 81 MG tablet [id number] No Take 81 mg by mouth daily with lunch. Swallow whole.  Patient not taking: Reported on 03/07/2021   [provider] Not Taking Active Child  CALCIUM PO [id number] No Take 1 tablet by mouth every other day.  Patient not taking: Reported on 03/07/2021   [provider] Not Taking Active Child  Cholecalciferol (VITAMIN D3 PO) [id number] No Take 1 tablet by mouth daily with lunch.  Patient not taking: Reported on 03/07/2021   [provider] Not Taking Active Child  docusate sodium (COLACE) 100 MG capsule [id number] Yes Take 200 mg by mouth daily. [provider] Taking Active Child  fexofenadine (ALLEGRA) 180 MG tablet [id number] Yes Take 180 mg by mouth daily. [provider] Taking Active  furosemide (LASIX) 20 MG tablet [id number] Yes Take 1 tablet (20 mg total) by mouth daily.  Patient taking differently: Take 20 mg by mouth daily as needed for fluid or edema.   Carollee Herter, Kendrick Fries R, DO Taking Active   gemfibrozil (LOPID) 600 MG tablet [id number] Yes TAKE 1 TABLET BY MOUTH EVERY DAY [name], DO Taking Active            Med Note Barbaraann Boys Mar 07, 2021 11:16 AM) Dewaine Conger at lunch  guaiFENesin (MUCINEX) 600 MG 12 hr tablet [id number] Yes Take 600 mg by mouth as needed. [provider] Taking Active Child  levothyroxine (SYNTHROID) 100 MCG tablet [id number] Yes Take 1 tablet (100 mcg total) by mouth daily before breakfast. Carollee Herter, Alferd Apa, DO Taking Active   losartan (COZAAR) 50 MG tablet [id number] Yes Take 1 tablet (50 mg total) by mouth daily. [name], DO Taking Active            Med Note Barbaraann Boys Mar 07, 2021 11:17 AM) lunch  mirabegron ER (MYRBETRIQ) 25 MG TB24 tablet [id number] Yes Take 25 mg by mouth at bedtime. [provider] Taking Active  Child  Multiple Vitamins-Minerals (MULTIVITAMIN ADULT) CHEW [id number] Yes Chew 2 each by mouth daily. [provider] Taking Active   polyvinyl alcohol (LIQUIFILM TEARS) 1.4 % ophthalmic solution [id number] Yes Place 1 drop into both eyes daily as needed for dry eyes. [provider] Taking Active Child  traMADol (ULTRAM) 50 MG tablet [id number]  Take 0.5 tablets (25 mg total) by mouth every 6 (six) hours as needed for severe pain. 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   Chronic kidney disease, stage 3a ([place]) 08/07/2020   Leukopenia 08/07/2020   Acute respiratory failure with hypoxia ([place]) 08/07/2020   SIRS (systemic inflammatory response syndrome) ([place]) 08/07/2020   Methotrexate adverse reaction, initial encounter    Acute pain of right shoulder 04/14/2020   Lower extremity edema 11/13/2019   Head trauma 11/13/2019   Dyslipidemia 11/13/2019   Memory loss 07/28/2019   Chronic right shoulder pain 07/28/2019   Frequent falls 07/28/2019   Balance problem 07/28/2019   Dysphagia 07/28/2019   ICH (intracerebral hemorrhage) ([place]) 03/10/2019   Basilar artery stenosis with infarction ([place]) 12/18/2017   TIA (transient ischemic attack) 12/10/2017   Rheumatoid arthritis ([place])    Chest pain 06/02/2011   General medical examination 04/11/2011   PEPTIC ULCER DISEASE, HELICOBACTER PYLORI POSITIVE 07/12/2010   PERSONAL HISTORY MALIG NEOPLASM LARGE INTESTINE 05/18/2010   MITRAL VALVE DISORDERS 04/05/2009   HOARSENESS 12/04/2008   OSTEOPENIA 08/26/2008   HYPERTRIGLYCERIDEMIA 07/23/2008   EPISTAXIS 07/26/2007   ERECTILE DYSFUNCTION 02/22/2007   ECZEMA 12/07/2006   LEG CRAMPS 10/09/2006   Hypothyroidism 08/28/2006   Essential hypertension 08/28/2006   HAY FEVER 08/28/2006   INSOMNIA 08/28/2006    Immunization History  Administered Date(s)  Administered   Fluad Quad(high Dose 65+) 04/13/2020   Influenza Split 02/21/2011, 02/27/2012, 02/20/2014   Influenza Whole 04/03/2007, 02/26/2008, 02/15/2009, 02/01/2010   Influenza, High Dose Seasonal PF 01/28/2016, 02/13/2019   Influenza,inj,Quad PF,6+ Mos 02/20/2014   PFIZER Comirnaty(Gray Top)Covid-19 Tri-Sucrose Vaccine 08/24/2020   PFIZER(Purple Top)SARS-COV-2 Vaccination 05/31/2019, 06/21/2019, 02/20/2020   Pneumococcal Conjugate-13 07/23/2008, 02/11/2015   Pneumococcal Polysaccharide-23  07/23/2008   Td 11/15/2009   Td,absorbed, Preservative Free, Adult Use, Lf Unspecified 11/15/2009   Tdap 03/09/2019    Conditions to be addressed/monitored: CAD, HTN, HLD, Hypertriglyceridemia, CKD Stage 3, Hypothyroidism, and Osteopenia  Care Plan : General Pharmacy (Adult)  Updates made by Cherre Robins, PHARMD since 03/12/2021 12:00 AM     Problem: HTN; mixed hyperlipidemia; history of TIA; edema; hypothyroidism; memory loss; balance issues; osteopenia; anemia; RA; dyspepsia; colon CA   Priority: High  Onset Date: 03/07/2021     Long-Range Goal: Pharmacy Care Plan for Chronic Care Management   Start Date: 03/07/2021  Expected End Date: 08/24/2021  Priority: High  Note:   Current Barriers:  Unable to achieve control of hyperlipidemia and hypothyroidism  Unable to self administer medications as prescribed Does not adhere to prescribed medication regimen  Pharmacist Clinical Goal(s):  Over the next 90 days, patient will achieve adherence to monitoring guidelines and medication adherence to achieve therapeutic efficacy achieve control of hypothyroidism and hyperlipidemia as evidenced by TSH in therapeutic range and LDL <70 achieve ability to self administer medications as prescribed through use of weekly medication container with reminders as evidenced by patient report through collaboration with PharmD and provider.   Interventions: 1:1 collaboration with Carollee Herter, Alferd Apa, DO  regarding development and update of comprehensive plan of care as evidenced by provider attestation and co-signature Inter-disciplinary care team collaboration (see longitudinal plan of care) Comprehensive medication review performed; medication list updated in electronic medical record  Hypertension / Edema: Controlled; blood pressure goal <130/80 Current treatment: /Furosemide 56m - take 1 tablet daily (patient takes as needed for swelling)  Losartan 553mdaily at lunch  Denies hypotensive/hypertensive symptoms Daughter reports rarely has edema and rarely take furosemide Interventions:  Discussed blood pressure goals Discussed limiting intake of sodium for blood pressure and edema  Hyperlipidemia, mixed / histoy of TIA: LDL not at goal but Tg at goal; LDL goal <70 and Triglyceride goal <150 Last LDL was 98; Tg was 60 TIA noted 11/2017 Current treatment:  Aspirin 8156maily (reports not taking /03/07/2021) Gemfibrozil 600m31mce a day at lunch No history of taking statin in past Took tricagrelor in past after TIA Interventions: Discussed lipid goals Recommended restarting aspirin 81mg60mly; discussed benefits with history of TIA. Consider statin in future if LDL still >70 (taking into consideration patient's age and memory changes). Might be able to stop gemfibrozil.   Hypothyroidism:  Uncontrolled; Goal: TSH in therapeutic range Last TSH was elevated at 13.57 (02/25/2021) Patient noted to be non-adherent to levothyroxine Current treatment:  Levothyroxine 100mcg70me 1 tablet each morning Interventions: Discussed symptoms of low thyroid Discussed importance of taking levothyroxine daily  Daughter will purchase weekly pill container with alarm attached to container. Will also track that patient has taken medications.  Due to recheck TSH in 8 weeks (around 04/27/2021)  Shoulder pain:  Controlled;  Current treatment:  Tramadol 50mg -49me 0.5 tablet = 25mg up73mevery 6  hours as needed Patient rarely takes tramadol but per daughter is in need of refill. Last refilled #30 on 08/13/2020 Interventions:  Coordinated refill for tramadol with PCP  Osteopenia with low vitamin D:  Last DEXA 2012 showed T-Score of -1.8 at neck of right femur Noted to have frequent falls due to balance but no fractures Current therapy:  Multivitamin daily  Previous therapies: alendronate, vitamin D supplement, calcium supplement (stopped due to constipation) Has history of low vitamin D but last serum vitamin D was  49.4 (04/13/2020) Interventions:  Consider rechecking serum vitamin D with next labs Could consider rechecking DEXA if would change treatment plan  Fall prevention   Medication management Pharmacist Clinical Goal(s): Over the next 90 days, patient will work with PharmD and providers to maintain optimal medication adherence Current [facility] Interventions Comprehensive medication review performed. Reviewed refill history and assessed adherence Continue current medication management strategy Patient self care activities - Over the next 90 days, patient will: Focus on medication adherence by filling medications appropriately  Purchase weekly pill container with alarms / reminders Take medications as prescribed Report any questions or concerns to PharmD and/or provider(s)  Patient Goals/Self-Care Activities Over the next 90 days, patient will:  Take medications as prescribed, Focus on medication adherence by using weekly pill container with alarms Contact provider or clinical pharmacist with medication questions.  Restart aspirin 64m daily Recheck thyroid labs and serum vitamin D in December 2022.   Follow Up Plan: Telephone follow up appointment with care management team member scheduled for:  2 to 4 weeks         Medication Assistance: None required.  Patient affirms current coverage meets needs.  [facility]  [id number] [place], [place]: [phone]: [phone]  ([place]- [place]one: [phone]: [phone] pill box? Yes Daughter endorses 80% compliance  Follow Up:  Patient agrees to Care Plan and Follow-up.  Plan: Telephone follow up appointment with care management team member scheduled for:  2 to 4 weeks  TaCherre RobinsPharmD Clinical Pharmacist [facility] MeNorthamptoniThe [facility]

## 2021-03-07 NOTE — Telephone Encounter (Signed)
During Chronic Care Management visit today, daughter asked about refill on tramadol. States patient takes as needed for shoulder pain. It looks like was last prescribed when discharged form hospital 08/13/2020 for #30 tablets - tramadol 50mg  - take 0.5 tablet every 6 hours as needed for severe pain.  Please advise if you would recommend continuing tramadol and if OK, send in new prescription to [place].

## 2021-03-12 NOTE — Patient Instructions (Signed)
Visit Information  PATIENT GOALS:  Goals Addressed             This [facility] Chronic Care Management        Interventions: 1:1 collaboration with Carollee Herter, Alferd Apa, DO regarding development and update of comprehensive plan of care as evidenced by provider attestation and co-signature Inter-disciplinary care team collaboration (see longitudinal plan of care) Comprehensive medication review performed; medication list updated in electronic medical record  Hypertension / Edema: Controlled; blood pressure goal <130/80  BP Readings from Last 3 Encounters:  03/01/21 106/60  09/07/20 128/60  08/13/20 (!) 144/64   Current treatment: Furosemide 20mg  - take 1 tablet daily (patient takes as needed for swelling)  Losartan 50mg  daily at lunch  Interventions:  Reviewed blood pressure goals Reviewed limiting intake of sodium for blood pressure and edema  Hyperlipidemia, mixed / histoy of TIA: LDL not at goal but Tg at goal; LDL goal <70 and Triglyceride goal <150  Lipid Panel     Component Value Date/Time   CHOL 166 02/25/2021 1059   TRIG 60.0 02/25/2021 1059   HDL 56.30 02/25/2021 1059   CHOLHDL 3 02/25/2021 1059   VLDL 12.0 02/25/2021 1059   LDLCALC 98 02/25/2021 1059   LDLDIRECT 74.9 04/11/2011 0933  Current treatment:  Aspirin 81mg  daily (reports not taking 03/07/2021) Gemfibrozil 600mg  once a day at lunch Interventions: Discussed lipid goals Recommended restarting aspirin 81mg  daily; discussed benefits with history of TIA.  Hypothyroidism:  Uncontrolled; Goal: TSH in therapeutic range Last TSH was elevated at 13.57 (02/25/2021) Patient noted to be non-adherent to levothyroxine Current treatment:  Levothyroxine 132mcg take 1 tablet each morning Interventions: Discussed importance of taking levothyroxine daily  Daughter will purchase weekly pill container with alarm attached to container. Will also track that patient has taken  medications.  Due to recheck TSH in 8 weeks (around 04/27/2021)  Shoulder pain:  Controlled;  Current treatment:  Tramadol 50mg  - take 0.5 tablet = 25mg  up to every 6 hours as needed Patient rarely takes tramadol but per daughter is in need of refill. Last refilled #30 on 08/13/2020 Interventions:  Coordinated refill for tramadol with PCP Continue to take tramadol if needed   Osteopenia with low vitamin D:  Last DEXA 2012 showed T-Score of -1.8 at neck of right femur Noted to have frequent falls due to balance but no fractures Current therapy:  Multivitamin daily  Previous therapies: alendronate, vitamin D supplement, calcium supplement (stopped due to constipation) Has history of low vitamin D but last serum vitamin D was 49.4 - normal range (04/13/2020) Interventions:  Consider rechecking serum vitamin D with next labs Fall prevention   Medication management Pharmacist Clinical Goal(s): Over the next 90 days, patient will work with PharmD and providers to maintain optimal medication adherence Current pharmacy: [facility] Interventions Comprehensive medication review performed. Reviewed refill history and assessed adherence Continue current medication management strategy Patient self care activities - Over the next 90 days, patient will: Focus on medication adherence by filling medications appropriately  Purchase weekly pill container with alarms / reminders Take medications as prescribed Report any questions or concerns to PharmD and/or provider(s)  Patient Goals/Self-Care Activities Over the next 90 days, patient will:  Take medications as prescribed, Focus on medication adherence by using weekly pill container with alarms Contact provider or clinical pharmacist with medication questions.  Restart aspirin 81mg  daily Recheck thyroid labs and serum vitamin D in December 2022.   Follow  Up Plan: Telephone follow up appointment with care management team member scheduled for:  2 to  4 weeks          Fall Prevention in the Home, Adult Falls can cause injuries and can affect people from all age groups. There are many simple things that you can do to make your home safe and to help prevent falls. Ask for help when making these changes, if needed. What actions can I take to prevent falls? General instructions Use good lighting in all rooms. Replace any light bulbs that burn out. Turn on lights if it is dark. Use night-lights. Place frequently used items in easy-to-reach places. Lower the shelves around your home if necessary. Set up furniture so that there are clear paths around it. Avoid moving your furniture around. Remove throw rugs and other tripping hazards from the floor. Avoid walking on wet floors. Fix any uneven floor surfaces. Add color or contrast paint or tape to grab bars and handrails in your home. Place contrasting color strips on the first and last steps of stairways. When you use a stepladder, make sure that it is completely opened and that the sides are firmly locked. Have someone hold the ladder while you are using it. Do not climb a closed stepladder. Be aware of any and all pets. What can I do in the bathroom?   Keep the floor dry. Immediately clean up any water that spills onto the floor. Remove soap buildup in the tub or shower on a regular basis. Use non-skid mats or decals on the floor of the tub or shower. Attach bath mats securely with double-sided, non-slip rug tape. If you need to sit down while you are in the shower, use a plastic, non-slip stool. Install grab bars by the toilet and in the tub and shower. Do not use towel bars as grab bars. What can I do in the bedroom? Make sure that a bedside light is easy to reach. Do not use oversized bedding that drapes onto the floor. Have a firm chair that has side arms to use for getting dressed. What can I do in the kitchen? Clean up any spills right away. If you need to reach for something  above you, use a sturdy step stool that has a grab bar. Keep electrical cables out of the way. Do not use floor polish or wax that makes floors slippery. If you must use wax, make sure that it is non-skid floor wax. What can I do in the stairways? Do not leave any items on the stairs. Make sure that you have a light switch at the top of the stairs and the bottom of the stairs. Have them installed if you do not have them. Make sure that there are handrails on both sides of the stairs. Fix handrails that are broken or loose. Make sure that handrails are as long as the stairways. Install non-slip stair treads on all stairs in your home. Avoid having throw rugs at the top or bottom of stairways, or secure the rugs with carpet tape to prevent them from moving. Choose a carpet design that does not hide the edge of steps on the stairway. Check any carpeting to make sure that it is firmly attached to the stairs. Fix any carpet that is loose or worn. What can I do on the outside of my home? Use bright outdoor lighting. Regularly repair the edges of walkways and driveways and fix any cracks. Remove high doorway thresholds. Trim any shrubbery  on the main path into your home. Regularly check that handrails are securely fastened and in good repair. Both sides of any steps should have handrails. Install guardrails along the edges of any raised decks or porches. Clear walkways of debris and clutter, including tools and rocks. Have leaves, snow, and ice cleared regularly. Use sand or salt on walkways during winter months. In the garage, clean up any spills right away, including grease or oil spills. What other actions can I take? Wear closed-toe shoes that fit well and support your feet. Wear shoes that have rubber soles or low heels. Use mobility aids as needed, such as canes, walkers, scooters, and crutches. Review your medicines with your health care provider. Some medicines can cause dizziness or  changes in blood pressure, which increase your risk of falling. Talk with your health care provider about other ways that you can decrease your risk of falls. This may include working with a physical therapist or trainer to improve your strength, balance, and endurance. Where to find more information Centers for Disease Control and Prevention, [employer]: WebmailGuide.co.za [employer] on Aging: BrainJudge.co.uk Contact a health care provider if: You are afraid of falling at home. You feel weak, drowsy, or dizzy at home. You fall at home. Summary There are many simple things that you can do to make your home safe and to help prevent falls. Ways to make your home safe include removing tripping hazards and installing grab bars in the bathroom. Ask for help when making these changes in your home. This information is not intended to replace advice given to you by your health care provider. Make sure you discuss any questions you have with your health care provider. Document Revised: 04/20/2017 Document Reviewed: 12/21/2016 Elsevier Patient Education  2021 [place].   Patient verbalizes understanding of instructions provided today and agrees to view in [place].   Telephone follow up appointment with care management team member scheduled for: 2 to 4 weeks  Cherre Robins, PharmD Clinical Pharmacist [facility]

## 2021-03-21 DIAGNOSIS — E039 Hypothyroidism, unspecified: Secondary | ICD-10-CM

## 2021-03-21 DIAGNOSIS — E785 Hyperlipidemia, unspecified: Secondary | ICD-10-CM | POA: Diagnosis not present

## 2021-03-21 DIAGNOSIS — I10 Essential (primary) hypertension: Secondary | ICD-10-CM | POA: Diagnosis not present

## 2021-03-27 ENCOUNTER — Other Ambulatory Visit: Payer: Self-pay | Admitting: Family Medicine

## 2021-03-29 ENCOUNTER — Ambulatory Visit (INDEPENDENT_AMBULATORY_CARE_PROVIDER_SITE_OTHER): Payer: Medicare Other

## 2021-03-29 DIAGNOSIS — E039 Hypothyroidism, unspecified: Secondary | ICD-10-CM

## 2021-03-29 DIAGNOSIS — I10 Essential (primary) hypertension: Secondary | ICD-10-CM

## 2021-03-29 NOTE — Chronic Care Management (AMB) (Signed)
Chronic Care Management   CCM RN Visit Note  03/29/2021 Name: [name] MRN: [id number] DOB: [birth year]  Subjective: [name] is a 87 y.o. year old male who is a primary care patient of [name], DO. The care management team was consulted for assistance with disease management and care coordination needs.    Engaged with patient by telephone for initial visit in response to provider referral for case management and/or care coordination services.   Consent to Services:  The patient was given the following information about Chronic Care Management services today, agreed to services, and gave verbal consent: 1. CCM service includes personalized support from designated clinical staff supervised by the primary care provider, including individualized plan of care and coordination with other care providers 2. 24/7 contact phone numbers for assistance for urgent and routine care needs. 3. Service will only be billed when office clinical staff spend 20 minutes or more in a month to coordinate care. 4. Only one practitioner may furnish and bill the service in a calendar month. 5.The patient may stop CCM services at any time (effective at the end of the month) by phone call to the office staff. 6. The patient will be responsible for cost sharing (co-pay) of up to 20% of the service fee (after annual deductible is met). Patient agreed to services and consent obtained.  Patient agreed to services and verbal consent obtained.   Assessment: Review of patient past medical history, allergies, medications, health status, including review of consultants reports, laboratory and other test data, was performed as part of comprehensive evaluation and provision of chronic care management services.   SDOH (Social Determinants of Health) assessments and interventions performed:    CCM Care Plan  Allergies  Allergen Reactions   Methotrexate Derivatives Other (See Comments)    Mouth sores / pancytopenia    Asa [Aspirin] Other (See Comments)    Gastric symptoms - can tolerate 81 mg aspirin   Penicillin G Rash    Did it involve swelling of the face/tongue/throat, SOB, or low BP? Unknown Did it involve sudden or severe rash/hives, skin peeling, or any reaction on the inside of your mouth or nose? Unknown Did you need to seek medical attention at a hospital or doctor's office? Unknown When did it last happen?   unknown    If all above answers are "NO", may proceed with cephalosporin use.    Outpatient Encounter Medications as of 03/29/2021  Medication Sig Note   docusate sodium (COLACE) 100 MG capsule Take 200 mg by mouth daily.    fexofenadine (ALLEGRA) 180 MG tablet Take 180 mg by mouth daily.    furosemide (LASIX) 20 MG tablet Take 1 tablet (20 mg total) by mouth daily. (Patient taking differently: Take 20 mg by mouth daily as needed for fluid or edema.)    gemfibrozil (LOPID) 600 MG tablet TAKE 1 TABLET BY MOUTH EVERY DAY    guaiFENesin (MUCINEX) 600 MG 12 hr tablet Take 600 mg by mouth as needed.    levothyroxine (SYNTHROID) 100 MCG tablet Take 1 tablet (100 mcg total) by mouth daily before breakfast.    losartan (COZAAR) 50 MG tablet Take 1 tablet (50 mg total) by mouth daily. 03/07/2021: lunch   mirabegron ER (MYRBETRIQ) 25 MG TB24 tablet Take 25 mg by mouth at bedtime.    Multiple Vitamins-Minerals (MULTIVITAMIN ADULT) CHEW Chew 2 each by mouth daily.    polyvinyl alcohol (LIQUIFILM TEARS) 1.4 % ophthalmic solution Place 1 drop into both  eyes daily as needed for dry eyes.    aspirin EC 81 MG tablet Take 81 mg by mouth daily with lunch. Swallow whole. (Patient not taking: No sig reported)    CALCIUM PO Take 1 tablet by mouth every other day. (Patient not taking: No sig reported)    Cholecalciferol (VITAMIN D3 PO) Take 1 tablet by mouth daily with lunch. (Patient not taking: No sig reported)    traMADol (ULTRAM) 50 MG tablet Take 0.5 tablets (25 mg total) by mouth every 6 (six) hours as  needed for severe pain. (Patient not taking: Reported on 03/29/2021)    No facility-administered encounter medications on file as of 03/29/2021.    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   Chronic kidney disease, stage 3a ([place]) 08/07/2020   Leukopenia 08/07/2020   Acute respiratory failure with hypoxia ([place]) 08/07/2020   SIRS (systemic inflammatory response syndrome) ([place]) 08/07/2020   Methotrexate adverse reaction, initial encounter    Acute pain of right shoulder 04/14/2020   Lower extremity edema 11/13/2019   Head trauma 11/13/2019   Dyslipidemia 11/13/2019   Memory loss 07/28/2019   Chronic right shoulder pain 07/28/2019   Frequent falls 07/28/2019   Balance problem 07/28/2019   Dysphagia 07/28/2019   ICH (intracerebral hemorrhage) ([place]) 03/10/2019   Basilar artery stenosis with infarction ([place]) 12/18/2017   TIA (transient ischemic attack) 12/10/2017   Rheumatoid arthritis ([place])    Chest pain 06/02/2011   General medical examination 04/11/2011   PEPTIC ULCER DISEASE, HELICOBACTER PYLORI POSITIVE 07/12/2010   PERSONAL HISTORY MALIG NEOPLASM LARGE INTESTINE 05/18/2010   MITRAL VALVE DISORDERS 04/05/2009   HOARSENESS 12/04/2008   OSTEOPENIA 08/26/2008   HYPERTRIGLYCERIDEMIA 07/23/2008   EPISTAXIS 07/26/2007   ERECTILE DYSFUNCTION 02/22/2007   ECZEMA 12/07/2006   LEG CRAMPS 10/09/2006   Hypothyroidism 08/28/2006   Essential hypertension 08/28/2006   HAY FEVER 08/28/2006   INSOMNIA 08/28/2006    Conditions to be addressed/monitored:HTN and Hypothyroidism  Care Plan : RN Care Manager Plan of Care  Updates made by Luretha Rued, RN since 03/30/2021 12:00 AM     Problem: No Plan of Care Established for Mangment of Chronic Dsiease (HTN, Hypothyroid)   Priority: High     Long-Range Goal: Developement of plan of care for Chronic Disease Managment (HTN, Hypothryroid)   Start Date:  03/29/2021  Expected End Date: 07/28/2021  Priority: High  Note:   Current Barriers: RNCM spoke with patient's daughter, Johny Blamer, designated party release. Ms Tonye Royalty reports patient lives with his wife. Both are [place] speaking. She reports Mr. Demuro has balance issues and has fallen without injury in the past. Daughter expresses plans to continue both parents in the home and states needs for personal care assistance. She reports Patient has transportation to provider appointments, but needs transportation for personal use also. Daughter lives out of town, but comes home to assist with her parents needs every 1-2 weeks. She reports a Education officer, museum from [employer] will be coming out to assess her parents needs this week. Knowledge Deficits related to plan of care for management of HTN and Hypothyroidism Care Coordination needs related to Limited social support Language Barrier-vietnamese speaking  RNCM Clinical Goal(s):  Patient will verbalize understanding of plan for management of HTN and Hypothyroidism attend all scheduled medical appointments: . work with pharmacist to address medication adherance related toHTN and Hypothyroidism through collaboration with RN Care manager, provider, and care team.  Interventions: 1:1 collaboration with primary care provider regarding development and update of comprehensive plan of care as evidenced by provider attestation and co-signature Inter-disciplinary care team collaboration (see longitudinal plan of care) Evaluation of current treatment plan related to  self management and patient's adherence to plan as established by provider Provided local community resource information: [facility] [phone] or [phone] for assistance with in home personal care; Congregational Nurse [phone]; [employer] [phone]; [facility] [employer] [facility] [place].  Hypertension  Interventions: New goal Last practice recorded BP readings:  BP Readings from Last 3 Encounters:  03/01/21 106/60  09/07/20 128/60  08/13/20 (!) 144/64  Most recent eGFR/CrCl:  Lab Results  Component Value Date   EGFR 55 (L) 08/06/2020    No components found for: CRCL  Evaluation of current treatment plan related to hypertension self management and patient's adherence to plan as established by provider; Reviewed medications with patient and discussed importance of compliance; Discussed plans with patient for ongoing care management follow up and provided patient with direct contact information for care management team;  Hypothroidism Interventions New goal. Evaluation of current treatment plan related to HTN and Hypothyroidism, Transportation self-management and patient's adherence to plan as established by provider. Discussed plans with patient for ongoing care management follow up and provided patient with direct contact information for care management team Discussed plans with patient for ongoing care management follow up and provided patient with direct contact information for care management team;  Encouraged to continue to take medications as prescribed. Encouraged to continue to work with clinical pharmacist for [employer].  Falls Interventions: Provided written and verbal education re: potential causes of falls and Fall prevention strategies;   Patient Goals/Self-Care Activities: Patient will attend all scheduled provider appointments Patient will continue to perform ADL's independently Patient will continue to perform IADL's independently Patient will call provider office for new concerns or questions Patient will work with BSW to address care coordination needs and will continue to work with the clinical team to address health care and disease management related needs.   Contact local resources provided to see if they are able to assist with your needs.  [facility] [phone] or [phone] for assistance with in home personal care; Congregational Nurse [phone]; [employer] [phone]; [facility] [facility] [place] Work with clinical pharmacist for [employer]. Call RN Case Manager with questions as needed for continued disease management and care coordination services.    Plan:Telephone follow up appointment with care management team member scheduled for:  next month The patient has been provided with contact information for the care management team and has been advised to call with any health related questions or concerns.    Thea Silversmith, RN, MSN, BSN, CCM Care Management Coordinator [facility] [phone]

## 2021-03-30 NOTE — Patient Instructions (Addendum)
Visit Information: Thank you for speaking with me today. Please see goal discussed below.  Patient Goals/Self-Care Activities: Patient will attend all scheduled provider appointments Patient will continue to perform ADL's independently Patient will continue to perform IADL's independently Patient will call provider office for new concerns or questions Patient will work with BSW to address care coordination needs and will continue to work with the clinical team to address health care and disease management related needs.   Contact local resources provided to see if they are able to assist with your needs. [facility] [phone] or [phone] for assistance with in home personal care; [facility] [phone]; [employer] [phone]; [facility] [employer] at [place] Work with clinical pharmacist for [employer]. Call RN Case Manager with questions as needed for continued disease management and care coordination services.  Nh???c gia?p/suy gip Hypothyroidism Nh??c gip l khi tuy?n gip khng t?o ra ?? m?t s? lo?i hc mn nh?t ??nh (tuy?n gip km [place]?t ??ng). Tuy?n gip l m?t tuy?n nh? n?m trong ph?n d??i pha tr??c c?a c?, ngay tr??c kh qu?n (kh qu?n). Tuy?n ny t?o ra cc hc mn gip ki?m sot cch th?c c? th? s? d?ng th?c ?n ?? t?o ra n?ng l??ng (chuy?n ha) c?ng nh? cch th?c Gerwig?t ??ng c?a tim v no. Nh?ng hc mn ny c?ng ?ng m?t vai tr trong vi?c gi? cho x??ng ch?c kh?e. Khi tuy?n gip km [place]?t ??ng, n s?n xu?t qu t cc hc mn thyroxine (T4) v triiodothyronine (T3). Nguyn nhn g gy ra? Tnh tr?ng ny c th? l do: B?nh Hashimoto. ?y l m?t b?nh trong ? h? th?ng phng ch?ng b?nh t?t (h? mi?n d?ch) c?a c? th? t?n cng tuy?n gip. ?y l nguyn nhn ph? bi?n nh?t. Nhi?m vi rt. Mang [place]. M?t s? lo?i thu?c nh?t ??nh. D? t?t b?m sinh. Tr??c ?y ? x? tr? ??u Romo?c c? ?? ?i?u tr? ung  th?. Tr??c ?y ? ?i?u tr? b?ng i-?t phng x?. Tr??c ?y ? ph?i nhi?m phng x? trong mi tr??ng. Tr??c ?y ? ph?u thu?t c?t b? m?t ph?n Sieling?c ton b? tuy?n gip. Cc v?n ?? v?i m?t tuy?n n?m ? gi?a no (tuy?n yn). Khng ?? i-?t trong ch? ?? ?n. ?i?u g lm t?ng nguy c?? Qu v? d? b? tnh tr?ng ny h?n n?u: Qu v? la? n?? gi?i. Qu v? c ti?n s? gia ?nh bi? b?nh tuy?n gip. Qu v? s? d?ng m?t lo?i thu?c c tn l lithium. Qu v? dng thu?c ?nh h??ng ??n h? mi?n d?ch (thu?c ?c ch? mi?n d?ch). Cc d?u hi?u [place]?c tri?u ch?ng l g? Nh?ng tri?u ch?ng c?a tnh tr?ng ny bao g?m: C?m th?y nh? l qu v? khng c n?ng l??ng (l? ph?). Khng th? ch?u ???c l?nh. T?ng cn khng th? gi?i thch b?ng thay ??i ch? ?? ?n Talavera?c thi quen t?p th? d?c. Chn ?n. Da kh. Tc x?. Kinh nguy?t khng ??u. Suy ngh? ch?m ch?p. To bn. Bu?n b Mozley?c tr?m c?m. Ch?n ?on tnh tr?ng ny nh? th? no? Tnh tr?ng ny c th? ???c ch?n ?on d?a vo: Cc tri?u ch?ng, b?nh s? c?a qu v? v khm th?c th?. Xt nghi?m mu. Quy? vi? c?ng c th? ???c la?m ca?c ki?m tra hnh ?nh nh? siu m Senseney?c ch?p MRI (ch?p c?ng h??ng t?). Tnh tr?ng ny ???c ?i?u tr? nh? th? no? Tnh tr?ng ny ???c ?i?u tr? b?ng thu?c thay th? cc hc mn tuy?n gip m c? th?  khng t?o ra. Sau khi qu v? b?t ??u ?i?u tr?, c th? m?t vi tu?n ?? h?t cc tri?u ch?ng. Tun th? nh?ng h??ng d?n ny ? nh: Ch? s? d?ng thu?c khng k ??n v thu?c k ??n theo ch? d?n c?a chuyn gia ch?m [place] s?c kh?e. N?u qu v? b?t ??u dng b?t c? lo?i thu?c m?i no, hy ni v?i chuyn gia ch?m [place] s?c kh?e. Tun th? t?t c? cc l?n khm theo di theo ch? d?n c?a chuyn gia ch?m [place] s?c kh?e. Vi?c ny c vai tr quan tr?ng. Khi tnh tr?ng c?a qu v? c?i thi?n, nhu c?u v? li?u l??ng thu?c hc mn tuy?n gip c?a qu v? c th? thay ??i. Qu v? s? c?n lm xt nghi?m mu ??nh k? ?? chuyn gia ch?m [place] s?c kh?e c th? theo di tnh tr?ng c?a qu v?. Hy lin l?c v?i chuyn gia ch?m  [place] s?c kh?e n?u: Cc tri?u ch?ng c?a qu v? khng ?? h?n sau khi ???c ?i?u tr?Sander Nephew v? ?ang dng thu?c thay th? hc mn tuy?n gip v qu v?: ?? nhi?u m? hi. B? rng mnh. Ca?m th?y lo l??ng. Gi?m cn nhanh. Khng th? ch?u ???c nhi?t. B? dao ??ng c?m xc. B? tiu ch?y. C?m th?y y?u. Yu c?u gip ?? ngay l?p t?c n?u qu v? c: ?au ng?c. Nh?p tim khng ??u. Nh?p tim nhanh. Kh th?. Tm t?t Nh??c gip l khi tuy?n gip khng t?o ra ?? m?t s? lo?i hc mn nh?t ??nh (tuy?n gip km Christmas?t ??ng). Khi tuy?n gip km Gora?t ??ng, n s?n xu?t qu t cc hc mn thyroxine (T4) v triiodothyronine (T3). Nguyn nhn th??ng g?p nh?t l b?nh Hashimoto, m?t b?nh trong ? h? th?ng phng ch?ng b?nh t?t (h? mi?n d?ch) c?a c? th? t?n cng tuy?n gip. Tnh tr?ng ny c?ng c th? do nhi?m vi rt, thu?c, mang thai Hesler?c x? tr? ? ??? [place]?c c? tr??c ?y gy ra. Cc tri?u ch?ng c th? bao g?m t?ng cn, da kh, to bn, c?m th?y nh? th? qu v? khng c n?ng l??ng v khng th? ch?u ???c l?nh. Tnh tr?ng ny ???c ?i?u tr? b?ng thu?c thay th? cc hc mn tuy?n gip m c? th? khng t?o ra. Thng tin ny khng nh?m m?c ?ch thay th? cho l?i khuyn m chuyn gia ch?m [place] s?c kh?e ni v?i qu v?. Hy b?o ??m qu v? ph?i th?o lu?n b?t k? v?n ?? g m qu v? c v?i chuyn gia ch?m  s?c kh?e c?a qu v?. Document Revised: 03/10/2020 Document Reviewed: 02/18/2020 Elsevier Patient Education  2022 [place].    Ng?n ng?a ng ?? nh, ng??i l?n Fall Prevention in the Home, Adult [place]? co? th? gy th??ng ti?ch va? a?nh h???ng ??n mo?i ng???i thu?c mo?i l?a tu?i. Co? nhi?u vi?c ??n gia?n ma? quy? vi? co? th? la?m ?? la?m cho nha? quy? vi? an toa?n va? ng?n ng?a nga?Tasia Catchings c?u tr? gip khi th?c hi?n nh?ng thay ??i ny, n?u c?n. Nh?ng hnh ??ng no ti c th? th??c hi?n ?? ng?n ng?a ng? H??ng d?n chung Chi?u sng t?t trong t?t c? cc phng. Thay bng ?n b? chy, b?t ?n n?u t?i v s? d?ng ?n ban ?m. ?? nh??ng  th?? th??ng s? d?ng ? nh?ng n?i d? v??i t??i. H? th?p cc ch? d?c quanh nh n?u c?n. S??p x?p ?? n?i th?t sao cho c l?i ?i thng thong xung quanh. Trnh di chuy?n ?? n?i th?t ? khu v?c  xung Holli Humbles. Tho bo? kho?i sa?n cc t?m th?m long r?i v nh??ng th?? c nguy c? gy tr???t nga?Hessie Diener ?i b? trn sn ??t. S?a ch?a b?t k? m?t sn khng b?ng ph??ng na?o. Thm mu [place]?c s?n [place]reuil??c b?ng da?n t??ng ph?n va?o thanh v?n trong nh c?a quy? vi?. Da?n ca?c da?i b?ng mu c?n quang ?? b?c ??u tin v b?c cu?i cng c?a c?u thang. Khi quy? vi? s? d?ng m?t ca?i thang g?p, hy ch?c ch?n r?ng n ?a? ???c m? ra h?t v hai bn thang c?ng nh? chn ch?ng ?a? ????c kha ch?t. Nh?? ai ? gi? thang trong khi quy? vi? s? d?ng thang. Khng leo ln thang g?p ?a? g?p la?i. Bi?t v?t nui c?a qu v? ?ang ? ?u khi ?i l?i trong nh. Ti c th? lm g trong phng t?m?   Gi? cho sn kh. Ngay l?p t?c lm s?ch b?t k? ch? n??c no trn sn nh. Th??ng xuyn la?m sa?ch ch? ?o?ng xa? phng trong b?n t?m [place]?c bu?ng t??m vi hoa sen. Dng th?m ch?ng tr??t [place]?c ?? can trn sn b?n t?m Gude?c bu?ng t??m vi hoa sen. G??n th?m nh t?m an ton b??ng loa?i b?ng hai m?t ch?ng tr??t. N?u quy? vi? c?n ph?i ng?i xu?ng trong khi ?ang ? trong bu?ng t?m vo?i hoa sen, ha?y s? d?ng chi?c lo?i gh? nh?a khng tr?n tr??t. L?p cc thanh v?n ca?nh b?n c?u va? trong b?n t??m va? bu?ng t??m vo?i hoa sen. Khng s? d?ng cc thanh v??t kh?n m??t la?m thanh v?n. Ti c th? lm g trong phng ng?? Hy ch?c ch?n r?ng ?n c?nh gi??ng ng? co? th? v??i ??n d? da?ng. Khng s? d?ng b? ?? tra?i gi??ng ngo?i c? tho?ng xu?ng sn nh. C m?t chi?c gh? v?ng ch?c c tay vi?n hai bn ?? s? d?ng cho vi?c m?c qu?n o. Ti c th? lm g trong b?p? La?m s?ch m?i ch? tra?n n???c ngay l?p t?c. N?u quy? vi? c?n v??i m?t ci g ? ? trn cao, ha?y s? d?ng m?t chi?c gh? co? b?c va? tay vi?n ch??c ch??n. Gi? dy ?i?n sao cho khng v???ng ????ng ?i. Khng s?  d?ng xi Christner??c sa?p ?nh bng sn nh lm sn nha? tr?n. N?u quy? vi? ph?i s? d?ng sp, hy ch?c ch?n r?ng ? l loa?i sa?p khng tr?n tr??t du?ng cho sa?n nha?. Ti c th? lm g v?i c?u thang c?a ti? Khng ?? l?i b?t k? th?? gi? trn c?u thang. Hy ch?c ch?n quy? vi? c m?t cng t?c ?n ? ??u v cu?i c?u thang. L?p nh?ng cng t??c ?o? n?u qu v? khng c. Hy ch?c ch?n r?ng c tay v?n ? hai bn c?u thang. Ch??a tay v?n b? h?ng [place]?c l?ng. Hy ch?c ch?n r?ng tay v?n da?i b??ng chi?u da?i c?u thang. G?n m?t b?c thang ch?ng tr??t trn t?t c? cc c?u thang trong nh. Trnh ?? th?m long ra ? trn cng Baird?c d??i cng c?a c?u thang, [place]?c g??n th?m b??ng b?ng da?n tha?m ?? ng?n khng cho tha?m di chuy?n. Ch?n m?t thi?t k? th?m khng che gi?u cc c?nh c?a b?c trn c?u thang. Ki?m tra b?t c? tha?m c?u thang no ?? ??m b?o r?ng th?m ???c g?n ch??c v?i ca?c b?c thang. S?a ch?a b?t k? ch? th?m na?o l?ng [place]?c mn. Ti c th? lm g bn ngoi nh c?a ti? S? d?ng nh sng ngoi tr?i. Th??ng xuyn s?a ch?a cc c?nh l?i ?i v ????ng la?i xe v s?a  ch?a nh??ng v?t n?t. Lo?i b? cc ng??ng c?a cao. C??t go?n b?t k? bu?i cy na?o trn con ???ng chnh d?n vo nh quy? vi?. Th??ng xuyn ki?m tra xem tay v?n co? ???c l??p ch??t va? ????c s??a ch??a ki?p th??i khng. C? hai bn c?a t?t c? cc b?c thang ??u c?n c tay v?n. L??p tha?nh lan can d?c theo cc c?nh c?a b?t k? m??t sn [place]ender??c ha?nh lang n?i na?o. Do?n sa?ch ma?nh v?? va? nh??ng th?? b??a b?n kho?i l?i ?i, k? ca? ca?c du?ng cu? va? ca?c [place]?n ?a?Marland Kitchen Do?n sa?ch l, tuy?t v ? l?nh th??ng xuyn. S? d?ng ct Ortman??c mu?i trn l?i ?i trong nh?ng thng ma ?ng. Trong nh ?? xe, lm s?ch ngay l?p t??c b?t k? ch? d?u trn na?o, bao g?m m? [place]?c d?u trn. Ti c th? th?c hi?n nh?ng hnh ??ng no khc? ?i giy kn ngn chn v??a v??n v h? tr? bn chn c?a quy? vi?. ?i giy c ?? cao su Vessels?c gt th?p. S? d?ng cng cu? h? tr? di chuy?n khi c?n thi?t, nh?  g?y, khung t?p ?i, xe ??y v n?ng. Xem xt l?i cc lo?i thu?c c?a quy? vi? v?i chuyn gia ch?m [place] s?c kh?e. M?t s? lo?i thu?c c th? gy chng m?t Carter?c thay ??i huy?t p, lm t?ng nguy c? b? ng. Ni chuy?n v?i chuyn gia ch?m [place] s?c kh?e c?a quy? vi? v? nh?ng cch khc m quy? vi? c th? lm gi?m nguy c? b? ng. ?i?u ny c th? bao g?m lm vi?c v?i m?t chuyn gia v?t ly? tri? li?u [place]clarty?c hu?n luy?n vin ?? c?i thi?n s?c m?nh, kha? n?ng cn b?ng v s?c ch?u ??ng cu?a quy? vi?. N?i tm thm thng tin Centers for Disease Control and Prevention (Trung tm Ki?m sot v Phng ng?a D?ch b?nh), STEADI: http://www.wolf.info/ National Institute on Aging (Vi?n Lo khoa Qu?c gia): http://kim-miller.com/ Hy lin l?c v?i chuyn gia ch?m [place] s?c kh?e n?u: Qu v? s? s? ng ? nh. Qu v? c?m th?y y?u, u? o?i [place]?c chng m?t ? nh. Qu v? ng ? nh. Tm t?t Co? nhi?u vi?c ??n gia?n ma? quy? vi? co? th? la?m ?? la?m cho nha? quy? vi? an toa?n va? ng?n ng?a nga?Marland Kitchen Nh?ng cch ?? lm cho nh c?a qu v? an ton bao g?m lo?i b? nh?ng th? gy nguy c? tr??t ng v l?p thanh v?n trong phng t?m. Yu c?u tr? gip khi th?c hi?n nh?ng thay ??i trong nh c?a qu v?. Thng tin ny khng nh?m m?c ?ch thay th? cho l?i khuyn m chuyn gia ch?m [place] s?c kh?e ni v?i qu v?. Hy b?o ??m qu v? ph?i th?o lu?n b?t k? v?n ?? g m qu v? c v?i chuyn gia ch?m [place] s?c kh?e c?a qu v?. Document Revised: 10/07/2020 Document Reviewed: 10/07/2020 Elsevier Patient Education  2022 [place].   Patient verbalizes understanding of instructions provided today and agrees to view in [place].   Telephone follow up appointment with care management team member scheduled for: 04/26/21 The patient has been provided with contact information for the care management team and has been advised to call with any health related questions or concerns.   Thea Silversmith, RN, MSN, BSN, CCM Care Management Coordinator [facility] [phone]

## 2021-03-31 ENCOUNTER — Other Ambulatory Visit: Payer: Self-pay | Admitting: Family Medicine

## 2021-04-07 ENCOUNTER — Ambulatory Visit: Payer: Medicare Other | Admitting: Pharmacist

## 2021-04-07 DIAGNOSIS — E039 Hypothyroidism, unspecified: Secondary | ICD-10-CM

## 2021-04-07 DIAGNOSIS — R413 Other amnesia: Secondary | ICD-10-CM

## 2021-04-07 DIAGNOSIS — E785 Hyperlipidemia, unspecified: Secondary | ICD-10-CM

## 2021-04-07 DIAGNOSIS — I10 Essential (primary) hypertension: Secondary | ICD-10-CM

## 2021-04-07 NOTE — Patient Instructions (Signed)
Visit Information [name] / [name],  It was a pleasure speaking with you today.  I have attached a summary of our visit today and information about your health goals.  If you have any questions or concerns, please feel free to contact me either at the phone number below or with a MyChart message.   Follow Up Plan: Telephone follow up appointment with care management team member scheduled for:  2 months with clinical pharmacist and 1 month with [facility] nurse care manager If you need to cancel or re-schedule our visit, please call [phone] and our care guide team will be happy to assist you.  Cherre Robins, PharmD Clinical Pharmacist [facility] [phone] (direct line)  [phone] (main office number)    Hypertension / Edema: Controlled; blood pressure goal <130/80  BP Readings from Last 3 Encounters:  03/01/21 106/60  09/07/20 128/60  08/13/20 (!) 144/64   Current treatment: Furosemide 20mg  - take 1 tablet daily (patient takes as needed for swelling)  Losartan 50mg  daily at lunch  Interventions:  Reviewed blood pressure goals Reviewed limiting intake of sodium for blood pressure and edema  Hyperlipidemia, mixed / histoy of TIA: LDL not at goal but Tg at goal; LDL goal <70 and Triglyceride goal <150  Lipid Panel     Component Value Date/Time   CHOL 166 02/25/2021 1059   TRIG 60.0 02/25/2021 1059   HDL 56.30 02/25/2021 1059   CHOLHDL 3 02/25/2021 1059   VLDL 12.0 02/25/2021 1059   LDLCALC 98 02/25/2021 1059   LDLDIRECT 74.9 04/11/2011 0933   Current treatment:  Aspirin 81mg  daily (reports not taking 03/07/2021) Gemfibrozil 600mg  once a day at lunch Interventions: Discussed lipid goals Recommended restarting aspirin 81mg  daily; discussed benefits with history of TIA / stroke  Hypothyroidism:  Uncontrolled; Goal: TSH in therapeutic range Last TSH was elevated at 13.57 (02/25/2021) Patient noted to be non-adherent to  levothyroxine Current treatment:  Levothyroxine 156mcg take 1 tablet each morning Interventions: Discussed importance of taking levothyroxine daily  Daughter will purchase weekly pill container with alarm attached to container. Will also track that patient has taken medications.  Due to recheck TSH around 04/27/2021  Shoulder pain:  Controlled;  Current treatment:  Tramadol 50mg  - take 0.5 tablet = 25mg  up to every 6 hours as needed Patient rarely takes tramadol but per daughter is in need of refill. Last refilled #30 on 08/13/2020 Interventions:  Continue to take tramadol if needed   Osteopenia with low vitamin D:  Last DEXA 2012 showed T-Score of -1.8 at neck of right femur Noted to have frequent falls due to balance but no fractures Current therapy:  Multivitamin daily  Previous therapies: alendronate, vitamin D supplement, calcium supplement (stopped due to constipation) Has history of low vitamin D but last serum vitamin D was 49.4 - normal range (04/13/2020) Interventions:  Consider rechecking serum vitamin D with next labs Fall prevention   Medication management Pharmacist Clinical Goal(s): Over the next 90 days, patient will work with PharmD and providers to maintain optimal medication adherence Current pharmacy: [facility] Interventions Comprehensive medication review performed. Reviewed refill history and assessed adherence Continue current medication management strategy Patient self care activities - Over the next 90 days, patient will: Focus on medication adherence by filling medications appropriately  Purchase weekly pill container with alarms / reminders Take medications as prescribed Report any questions or concerns to PharmD and/or provider(s)  Patient Goals/Self-Care Activities Over the next 90 days, patient will:  Take medications as prescribed, Focus on medication adherence by using weekly pill container with alarms Contact provider or clinical pharmacist with  medication questions.  Restart aspirin 81mg  daily Recheck thyroid labs and serum vitamin D in December 2022.     Patient verbalizes understanding of instructions provided today and agrees to view in [place].

## 2021-04-07 NOTE — Chronic Care Management (AMB) (Signed)
Chronic Care Management Pharmacy Note  04/07/2021 Name:  [name] MRN:  [id number] DOB:  1928-09-15  Summary: Discussed medication adherence. Adherence for levothyroxine improved but not 100% yet. Has not restarted aspirin yet (daughter states she forgot to buy more)  Recommendations/Changes made from today's visit: Recommended purchase pill container with alarms.  Restart aspirin 28m daily Discussed levothyroxine adherence - recheck TSH in December Consider rechecking vitamin D as well.   Subjective: Christian Donaghueis an 87y.o. year old male who is a primary patient of [name] DO.  The CCM team was consulted for assistance with disease management and care coordination needs.    Engaged with patient's daughter by telephone for follow up visit in response to provider referral for pharmacy case management and/or care coordination services.   Consent to Services:  The patient was given information about Chronic Care Management services, agreed to services, and gave verbal consent prior to initiation of services.  Please see initial visit note for detailed documentation.   Patient Care Team: [name] YAlferd Apa DO as PCP - General (Family Medicine) ECherre Robins RPH-CPP (Pharmacist)  Recent office visits: 03/01/2021 - PCP (Dr LEtter Sjogren Preventative Health Visit. Changed losartan HCTZ to just losartan 561mdaily due to fatigue, low BP and incresaed urination. Low adhernece to thyroid replacement therapy. TSH was 13.57. Restarted levothyroxine 10026mdaily   09/07/2020 - PCP (Dr LowEtter Sjogrencute visit for pansinusitis - prescribed doxycycline 100m103mice a day for 10 days and fluticsone nasal spray   Recent consult visits: None in last 6 months  Hospital visits: None in previous 6 months  Objective:  Lab Results  Component Value Date   CREATININE 1.75 (H) 02/25/2021   CREATININE 1.08 08/13/2020   CREATININE 1.24 08/12/2020    Lab Results  Component Value Date    HGBA1C 6.3 (H) 12/11/2017   Last diabetic Eye exam: No results found for: HMDIABEYEEXA  Last diabetic Foot exam: No results found for: HMDIABFOOTEX      Component Value Date/Time   CHOL 166 02/25/2021 1059   TRIG 60.0 02/25/2021 1059   HDL 56.30 02/25/2021 1059   CHOLHDL 3 02/25/2021 1059   VLDL 12.0 02/25/2021 1059   LDLCALC 98 02/25/2021 1059   LDLDIRECT 74.9 04/11/2011 0933    Hepatic Function Latest Ref Rng & Units 02/25/2021 08/07/2020 08/06/2020  Total Protein 6.0 - 8.3 g/dL 7.5 8.4(H) 7.6  Albumin 3.5 - 5.2 g/dL 4.3 4.0 4.5  AST 0 - 37 U/L 24 33 26  ALT 0 - 53 U/L _0 Alk Phosphatase 39 - 117 U/L 96 67 96  Total Bilirubin 0.2 - 1.2 mg/dL 0.3 0.6 0.5  Bilirubin, Direct 0.0 - 0.3 mg/dL - - -    Lab Results  Component Value Date/Time   TSH 13.57 (H) 02/25/2021 10:59 AM   TSH 1.90 04/13/2020 02:11 PM    CBC Latest Ref Rng & Units 02/25/2021 08/13/2020 08/12/2020  WBC 4.0 - 10.5 K/uL 7.3 2.5(L) 2.2(L)  Hemoglobin 13.0 - 17.0 g/dL 11.8(L) 10.9(L) 9.4(L)  Hematocrit 39.0 - 52.0 % 36.2(L) 32.6(L) 27.9(L)  Platelets 150.0 - 400.0 K/uL 237.0 113(L) 87(L)    Lab Results  Component Value Date/Time   VD25OH 49.40 04/13/2020 02:11 PM   VD25OH 43 11/15/2009 09:25 PM    Clinical ASCVD: Yes  The ASCVD Risk score (Arnett DK, et al., 2019) failed to calculate for the following reasons:   The 2019 ASCVD risk score is only valid  for ages 21 to 27     Social History   Tobacco Use  Smoking Status Never  Smokeless Tobacco Never   BP Readings from Last 3 Encounters:  03/01/21 106/60  09/07/20 128/60  08/13/20 (!) 144/64   Pulse Readings from Last 3 Encounters:  03/01/21 72  09/07/20 66  08/13/20 60   Wt Readings from Last 3 Encounters:  03/01/21 116 lb 9.6 oz (52.9 kg)  09/07/20 121 lb 12.8 oz (55.2 kg)  08/09/20 125 lb (56.7 kg)    Assessment: Review of patient past medical history, allergies, medications, health status, including review of consultants  reports, laboratory and other test data, was performed as part of comprehensive evaluation and provision of chronic care management services.   SDOH:  (Social Determinants of Health) assessments and interventions performed:     CCM Care Plan  Allergies  Allergen Reactions   Methotrexate Derivatives Other (See Comments)    Mouth sores / pancytopenia   Asa [Aspirin] Other (See Comments)    Gastric symptoms - can tolerate 81 mg aspirin   Penicillin G Rash    Did it involve swelling of the face/tongue/throat, SOB, or low BP? Unknown Did it involve sudden or severe rash/hives, skin peeling, or any reaction on the inside of your mouth or nose? Unknown Did you need to seek medical attention at a hospital or doctor's office? Unknown When did it last happen?   unknown    If all above answers are "NO", may proceed with cephalosporin use.    Medications Reviewed Today     Reviewed by Cherre Robins, RPH-CPP (Pharmacist) on 04/07/21 at 51  Med List Status: <None>   Medication Order Taking? Sig Documenting Provider Last Dose Status Informant  aspirin EC 81 MG tablet [id number] No Take 81 mg by mouth daily with lunch. Swallow whole.  Patient not taking: Reported on 03/07/2021   [provider] Not Taking Active   CALCIUM PO [id number] Yes Take 1 tablet by mouth every other day. [provider] Taking Active   Cholecalciferol (VITAMIN D3 PO) [id number] Yes Take 1 tablet by mouth daily with lunch. [provider] Taking Active   docusate sodium (COLACE) 100 MG capsule [id number] Yes Take 200 mg by mouth daily. [provider] Taking Active Child  fexofenadine (ALLEGRA) 180 MG tablet [id number] Yes Take 180 mg by mouth daily. [provider] Taking Active   furosemide (LASIX) 20 MG tablet [id number] Yes Take 1 tablet (20 mg total) by mouth daily.  Patient taking differently: Take 20 mg by mouth daily as needed for fluid or edema.   Carollee Herter, Kendrick Fries R, DO  Taking Active   gemfibrozil (LOPID) 600 MG tablet [id number] Yes TAKE 1 TABLET BY MOUTH EVERY DAY Carollee Herter, Alferd Apa, DO Taking Active   guaiFENesin (MUCINEX) 600 MG 12 hr tablet [id number] Yes Take 600 mg by mouth as needed. [provider] Taking Active Child  levothyroxine (SYNTHROID) 100 MCG tablet [id number] Yes Take 1 tablet (100 mcg total) by mouth daily before breakfast. Carollee Herter, Alferd Apa, DO Taking Active   losartan (COZAAR) 50 MG tablet [id number] Yes Take 1 tablet (50 mg total) by mouth daily. [name], DO Taking Active            Med Note Barbaraann Boys Mar 07, 2021 11:17 AM) lunch  mirabegron ER (MYRBETRIQ) 25 MG TB24 tablet [id number] Yes Take 25 mg by mouth at bedtime. [provider] Taking Active  Child  Multiple Vitamins-Minerals (MULTIVITAMIN ADULT) CHEW [id number] Yes Chew 2 each by mouth daily. [provider] Taking Active   polyvinyl alcohol (LIQUIFILM TEARS) 1.4 % ophthalmic solution [id number] Yes Place 1 drop into both eyes daily as needed for dry eyes. [provider] Taking Active Child  traMADol (ULTRAM) 50 MG tablet [id number] Yes Take 0.5 tablets (25 mg total) by mouth every 6 (six) hours as needed for severe pain. 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   Chronic kidney disease, stage 3a ([place]) 08/07/2020   Leukopenia 08/07/2020   Acute respiratory failure with hypoxia ([facility]) 08/07/2020   SIRS (systemic inflammatory response syndrome) ([facility]) 08/07/2020   Methotrexate adverse reaction, initial encounter    Acute pain of right shoulder 04/14/2020   Lower extremity edema 11/13/2019   Head trauma 11/13/2019   Dyslipidemia 11/13/2019   Memory loss 07/28/2019   Chronic right shoulder pain 07/28/2019   Frequent falls 07/28/2019   Balance problem 07/28/2019   Dysphagia  07/28/2019   ICH (intracerebral hemorrhage) ([place]) 03/10/2019   Basilar artery stenosis with infarction ([place]) 12/18/2017   TIA (transient ischemic attack) 12/10/2017   Rheumatoid arthritis ([place])    Chest pain 06/02/2011   General medical examination 04/11/2011   PEPTIC ULCER DISEASE, HELICOBACTER PYLORI POSITIVE 07/12/2010   PERSONAL HISTORY MALIG NEOPLASM LARGE INTESTINE 05/18/2010   MITRAL VALVE DISORDERS 04/05/2009   HOARSENESS 12/04/2008   OSTEOPENIA 08/26/2008   HYPERTRIGLYCERIDEMIA 07/23/2008   EPISTAXIS 07/26/2007   ERECTILE DYSFUNCTION 02/22/2007   ECZEMA 12/07/2006   LEG CRAMPS 10/09/2006   Hypothyroidism 08/28/2006   Essential hypertension 08/28/2006   HAY FEVER 08/28/2006   INSOMNIA 08/28/2006    Immunization History  Administered Date(s) Administered   Fluad Quad(high Dose 65+) 04/13/2020   Influenza Split 02/21/2011, 02/27/2012, 02/20/2014   Influenza Whole 04/03/2007, 02/26/2008, 02/15/2009, 02/01/2010   Influenza, High Dose Seasonal PF 01/28/2016, 02/13/2019   Influenza,inj,Quad PF,6+ Mos 02/20/2014   PFIZER Comirnaty(Gray Top)Covid-19 Tri-Sucrose Vaccine 08/24/2020   PFIZER(Purple Top)SARS-COV-2 Vaccination 05/31/2019, 06/21/2019, 02/20/2020   Pneumococcal Conjugate-13 07/23/2008, 02/11/2015   Pneumococcal Polysaccharide-23 07/23/2008   Td 11/15/2009   Td,absorbed, Preservative Free, Adult Use, Lf Unspecified 11/15/2009   Tdap 03/09/2019    Conditions to be addressed/monitored: CAD, HTN, HLD, Hypertriglyceridemia, CKD Stage 3, Hypothyroidism, and Osteopenia  Care Plan : General Pharmacy (Adult)  Updates made by Cherre Robins, RPH-CPP since 04/07/2021 12:00 AM     Problem: HTN; mixed hyperlipidemia; history of TIA; edema; hypothyroidism; memory loss; balance issues; osteopenia; anemia; RA; dyspepsia; colon CA   Priority: High  Onset Date: 03/07/2021     Long-Range Goal: Pharmacy Care Plan for Chronic Care Management   Start Date: 03/07/2021  Expected  End Date: 08/24/2021  Priority: High  Note:   Current Barriers:  Unable to achieve control of hyperlipidemia and hypothyroidism  Unable to self administer medications as prescribed Does not adhere to prescribed medication regimen  Pharmacist Clinical Goal(s):  Over the next 90 days, patient will achieve adherence to monitoring guidelines and medication adherence to achieve therapeutic efficacy achieve control of hypothyroidism and hyperlipidemia as evidenced by TSH in therapeutic range and LDL <70 achieve ability to self administer medications as prescribed through use of weekly medication container with reminders as evidenced by patient report through collaboration with PharmD and provider.   Interventions: 1:1  collaboration with Carollee Herter, Alferd Apa, DO regarding development and update of comprehensive plan of care as evidenced by provider attestation and co-signature Inter-disciplinary care team collaboration (see longitudinal plan of care) Comprehensive medication review performed; medication list updated in electronic medical record  Hypertension / Edema: Controlled; blood pressure goal <130/80 Current treatment: Furosemide 45m - take 1 tablet daily (patient takes as needed for swelling)  Losartan 577mdaily at lunch  Denies hypotensive/hypertensive symptoms Daughter reports rarely has edema and rarely takes furosemide Interventions:  Discussed blood pressure goals Discussed limiting intake of sodium for blood pressure and edema  Hyperlipidemia, mixed / histoy of TIA: LDL not at goal but Tg at goal; LDL goal <70 and Triglyceride goal <150 Last LDL was 98; Tg was 60 TIA noted 11/2017 Current treatment:  Aspirin 8164maily (reports not taking /03/07/2021) Gemfibrozil 600m60mce a day at lunch No history of taking statin in past Took tricagrelor in past after TIA Interventions: Discussed lipid goals Recommended restarting aspirin 81mg16mly; discussed benefits with history  of TIA. Consider statin in future if LDL still >70 (taking into consideration patient's age and memory changes). Might be able to stop gemfibrozil.   Hypothyroidism:  Uncontrolled; Goal: TSH in therapeutic range Last TSH was elevated at 13.57 (02/25/2021) Patient noted to be non-adherent to levothyroxine - per daughter adherence has improved but is not 100% She has hires an in home aide and this has helped. Current treatment:  Levothyroxine 100mcg65me 1 tablet each morning Interventions: Discussed symptoms of low thyroid Discussed importance of taking levothyroxine daily  Daughter will purchase weekly pill container with alarm attached to container. Will also track that patient has taken medications.  Due to recheck TSH in 8 weeks (around 04/27/2021). Reminded daughter of appointment  Shoulder pain:  Controlled;  Current treatment:  Tramadol 50mg -7me 0.5 tablet = 25mg up68mevery 6 hours as needed Patient rarely takes tramadol. Has not taken since last appointment  Interventions:  Continue to take tramadol as needed  Osteopenia with low vitamin D:  Last DEXA 2012 showed T-Score of -1.8 at neck of right femur Noted to have frequent falls due to balance but no fractures Current therapy:  Multivitamin daily  Previous therapies: alendronate, vitamin D supplement, calcium supplement (stopped due to constipation) Has history of low vitamin D but last serum vitamin D was 49.4 (04/13/2020) Interventions:  Consider rechecking serum vitamin D with next labs Could consider rechecking DEXA if would change treatment plan  Fall prevention   Medication management Pharmacist Clinical Goal(s): Over the next 90 days, patient will work with PharmD and providers to maintain optimal medication adherence Current [facility] Interventions Comprehensive medication review performed. Reviewed refill history and assessed adherence Continue current medication management strategy Patient self  care activities - Over the next 90 days, patient will: Focus on medication adherence by filling medications appropriately  Purchase weekly pill container with alarms / reminders Take medications as prescribed Report any questions or concerns to PharmD and/or provider(s) Requested refill from [facility] for Myrbetriq at caregiver's request.   Patient Goals/Self-Care Activities Over the next 90 days, patient will:  Take medications as prescribed, Focus on medication adherence by using weekly pill container with alarms Contact provider or clinical pharmacist with medication questions.  Restart aspirin 81mg dai27mecheck thyroid labs and serum vitamin D in December 2022.   Follow Up Plan: Telephone follow up appointment with care management team member scheduled for:  2 months; [facility] manager will  follow up with patient in 1 month.         Medication Assistance: None required.  Patient affirms current coverage meets needs.  Patient's preferred pharmacy [facility]:  [id number] [place] [address]Phone: [phone]: [phone] [facility] [place] [place] [address] [place]Phone: [phone]: [phone] Uses pill box? Yes Daughter endorses 80% compliance  Follow Up:  Patient agrees to Care Plan and Follow-up.  Plan: Telephone follow up appointment with care management team member scheduled for:  2 months  TCherre Robins PharmD Clinical Pharmacist LMem[facility] CenterPrimary [facility] SW MW[facility] VillageHIn[facility]

## 2021-04-12 DIAGNOSIS — N1831 Chronic kidney disease, stage 3a: Secondary | ICD-10-CM | POA: Diagnosis not present

## 2021-04-12 DIAGNOSIS — I129 Hypertensive chronic kidney disease with stage 1 through stage 4 chronic kidney disease, or unspecified chronic kidney disease: Secondary | ICD-10-CM | POA: Diagnosis not present

## 2021-04-12 DIAGNOSIS — E039 Hypothyroidism, unspecified: Secondary | ICD-10-CM | POA: Diagnosis not present

## 2021-04-12 DIAGNOSIS — W19XXXA Unspecified fall, initial encounter: Secondary | ICD-10-CM | POA: Diagnosis not present

## 2021-04-12 DIAGNOSIS — I639 Cerebral infarction, unspecified: Secondary | ICD-10-CM | POA: Diagnosis not present

## 2021-04-12 DIAGNOSIS — E785 Hyperlipidemia, unspecified: Secondary | ICD-10-CM | POA: Diagnosis not present

## 2021-04-12 DIAGNOSIS — C189 Malignant neoplasm of colon, unspecified: Secondary | ICD-10-CM | POA: Diagnosis not present

## 2021-04-12 DIAGNOSIS — N189 Chronic kidney disease, unspecified: Secondary | ICD-10-CM | POA: Diagnosis not present

## 2021-04-20 DIAGNOSIS — E039 Hypothyroidism, unspecified: Secondary | ICD-10-CM | POA: Diagnosis not present

## 2021-04-20 DIAGNOSIS — I10 Essential (primary) hypertension: Secondary | ICD-10-CM | POA: Diagnosis not present

## 2021-04-20 DIAGNOSIS — E785 Hyperlipidemia, unspecified: Secondary | ICD-10-CM | POA: Diagnosis not present

## 2021-04-26 ENCOUNTER — Telehealth: Payer: Medicare Other

## 2021-04-26 ENCOUNTER — Telehealth: Payer: Self-pay

## 2021-04-26 NOTE — Telephone Encounter (Signed)
  Care Management   Follow Up Note   04/26/2021 Name: Jerelle Virden MRN: [id number] DOB: 1928-10-18   Referred by: [name], DO Reason for referral : Chronic Care Management (RNCM Follow up)   An unsuccessful telephone outreach was attempted today. The patient was referred to the case management team for assistance with care management and care coordination.   Follow Up Plan: The care management team will reach out to the patient again over the next 30 days.   Thea Silversmith, RN, MSN, BSN, CCM Care Management Coordinator [facility] [phone]

## 2021-05-03 ENCOUNTER — Ambulatory Visit: Payer: Medicare Other

## 2021-05-03 ENCOUNTER — Other Ambulatory Visit: Payer: Medicare Other

## 2021-05-20 ENCOUNTER — Ambulatory Visit (INDEPENDENT_AMBULATORY_CARE_PROVIDER_SITE_OTHER): Payer: Medicare Other

## 2021-05-20 DIAGNOSIS — I10 Essential (primary) hypertension: Secondary | ICD-10-CM

## 2021-05-20 DIAGNOSIS — E039 Hypothyroidism, unspecified: Secondary | ICD-10-CM

## 2021-05-20 NOTE — Patient Instructions (Signed)
Visit Information  Thank you for taking time to visit with me today. Please don't hesitate to contact me if I can be of assistance to you before our next scheduled telephone appointment.  Following are the goals we discussed today:  Patient Goals/Self-Care Activities: Take all medications as prescribed Attend all scheduled provider appointments Call provider office for new concerns or questions  Continue fall precaution strategies: change positions slowly, keep walkway clear, provide good lighting, use assistive device as recommended, remain physically active to maintain muscle strength and tone Continue to work with Care Management Team for disease management and/or care coordination needs Call RN Case Manager for continued disease management and care coordination services as needed  Per Ms. Hutchinson request: Please call the care guide team at [phone] schedule follow up appointment for next month.   If you are experiencing a Mental Health or [place] or need someone to talk to, please call the Suicide and Crisis Lifeline: 988 call 1-800-273-TALK (toll free, 24 hour hotline)   Patient verbalizes understanding of instructions provided today and agrees to view in [place].   Thea Silversmith, RN, MSN, BSN, CCM Care Management Coordinator [facility] [phone]

## 2021-05-20 NOTE — Chronic Care Management (AMB) (Signed)
Chronic Care Management   CCM RN Visit Note  05/20/2021 Name: [name] MRN: [id number] DOB: 01-May-1929  Subjective: [name] is a 87 y.o. year old male who is a primary care patient of [name], DO. The care management team was consulted for assistance with disease management and care coordination needs.    Engaged with patient by telephone for follow up visit in response to provider referral for case management and/or care coordination services.   Consent to Services:  The patient was given information about Chronic Care Management services, agreed to services, and gave verbal consent prior to initiation of services.  Please see initial visit note for detailed documentation.   Patient agreed to services and verbal consent obtained.   Assessment: Review of patient past medical history, allergies, medications, health status, including review of consultants reports, laboratory and other test data, was performed as part of comprehensive evaluation and provision of chronic care management services.   SDOH (Social Determinants of Health) assessments and interventions performed:    CCM Care Plan  Allergies  Allergen Reactions   Methotrexate Derivatives Other (See Comments)    Mouth sores / pancytopenia   Asa [Aspirin] Other (See Comments)    Gastric symptoms - can tolerate 81 mg aspirin   Penicillin G Rash    Did it involve swelling of the face/tongue/throat, SOB, or low BP? Unknown Did it involve sudden or severe rash/hives, skin peeling, or any reaction on the inside of your mouth or nose? Unknown Did you need to seek medical attention at a hospital or doctor's office? Unknown When did it last happen?   unknown    If all above answers are NO, may proceed with cephalosporin use.    Outpatient Encounter Medications as of 05/20/2021  Medication Sig Note   aspirin EC 81 MG tablet Take 81 mg by mouth daily with lunch. Swallow whole. (Patient not taking: Reported on  03/07/2021)    CALCIUM PO Take 1 tablet by mouth every other day.    Cholecalciferol (VITAMIN D3 PO) Take 1 tablet by mouth daily with lunch.    docusate sodium (COLACE) 100 MG capsule Take 200 mg by mouth daily.    fexofenadine (ALLEGRA) 180 MG tablet Take 180 mg by mouth daily.    furosemide (LASIX) 20 MG tablet Take 1 tablet (20 mg total) by mouth daily. (Patient taking differently: Take 20 mg by mouth daily as needed for fluid or edema.)    gemfibrozil (LOPID) 600 MG tablet TAKE 1 TABLET BY MOUTH EVERY DAY    guaiFENesin (MUCINEX) 600 MG 12 hr tablet Take 600 mg by mouth as needed.    levothyroxine (SYNTHROID) 100 MCG tablet Take 1 tablet (100 mcg total) by mouth daily before breakfast.    losartan (COZAAR) 50 MG tablet Take 1 tablet (50 mg total) by mouth daily. 03/07/2021: lunch   mirabegron ER (MYRBETRIQ) 25 MG TB24 tablet Take 25 mg by mouth at bedtime.    Multiple Vitamins-Minerals (MULTIVITAMIN ADULT) CHEW Chew 2 each by mouth daily.    polyvinyl alcohol (LIQUIFILM TEARS) 1.4 % ophthalmic solution Place 1 drop into both eyes daily as needed for dry eyes.    traMADol (ULTRAM) 50 MG tablet Take 0.5 tablets (25 mg total) by mouth every 6 (six) hours as needed for severe pain.    No facility-administered encounter medications on file as of 05/20/2021.    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   Chronic kidney disease, stage 3a ([place]) 08/07/2020   Leukopenia 08/07/2020   Acute respiratory failure with hypoxia ([place]) 08/07/2020   SIRS (systemic inflammatory response syndrome) ([place]) 08/07/2020   Methotrexate adverse reaction, initial encounter    Acute pain of right shoulder 04/14/2020   Lower extremity edema 11/13/2019   Head trauma 11/13/2019   Dyslipidemia 11/13/2019   Memory loss 07/28/2019   Chronic right shoulder pain 07/28/2019   Frequent falls 07/28/2019   Balance problem 07/28/2019    Dysphagia 07/28/2019   ICH (intracerebral hemorrhage) ([place]) 03/10/2019   Basilar artery stenosis with infarction ([place]) 12/18/2017   TIA (transient ischemic attack) 12/10/2017   Rheumatoid arthritis ([place])    Chest pain 06/02/2011   General medical examination 04/11/2011   PEPTIC ULCER DISEASE, HELICOBACTER PYLORI POSITIVE 07/12/2010   PERSONAL HISTORY MALIG NEOPLASM LARGE INTESTINE 05/18/2010   MITRAL VALVE DISORDERS 04/05/2009   HOARSENESS 12/04/2008   OSTEOPENIA 08/26/2008   HYPERTRIGLYCERIDEMIA 07/23/2008   EPISTAXIS 07/26/2007   ERECTILE DYSFUNCTION 02/22/2007   ECZEMA 12/07/2006   LEG CRAMPS 10/09/2006   Hypothyroidism 08/28/2006   Essential hypertension 08/28/2006   HAY FEVER 08/28/2006   INSOMNIA 08/28/2006    Conditions to be addressed/monitored:HTN and Hypothyroidism, fall risk  Care Plan : RN Care Manager Plan of Care  Updates made by Luretha Rued, RN since 05/20/2021 12:00 AM     Problem: No Plan of Care Established for Mangment of Chronic Dsiease (HTN, Hypothyroid)   Priority: High     Long-Range Goal: Developement of plan of care for Chronic Disease Managment (HTN, Hypothryroid)   Start Date: 03/29/2021  Expected End Date: 07/28/2021  Priority: High  Note:   Current Barriers: Spoke with daughter [name] who states that Mr. and Mrs. Sofia are moving to [place] today. Ms. Tonye Royalty will continue to manage medications and a personal care assistant has been hired to assist with personal care service, medication reminders, meals as needed. Unable to review medications with daughter at this time due to actively moving patient to [facility]. Ms. Tonye Royalty denies any questions or concerns at this time. Knowledge Deficits related to plan of care for management of HTN and Hypothyroidism Care Coordination needs related to Limited social support Language Barrier-vietnamese speaking  RNCM Clinical Goal(s):  Patient will verbalize  understanding of plan for management of HTN and Hypothyroidism attend all scheduled medical appointments: . work with pharmacist to address medication adherance related toHTN and Hypothyroidism through collaboration with RN Care manager, provider, and care team.   Interventions: 1:1 collaboration with primary care provider regarding development and update of comprehensive plan of care as evidenced by provider attestation and co-signature Inter-disciplinary care team collaboration (see longitudinal plan of care) Provide support to patient/family he transitions to [facility]  Hypertension Interventions: Goal On Track: Yes. Long Term Last practice recorded BP readings:  BP Readings from Last 3 Encounters:  03/01/21 106/60  09/07/20 128/60  08/13/20 (!) 144/64  Most recent eGFR/CrCl:  Lab Results  Component Value Date   EGFR 55 (L) 08/06/2020    No components found for: CRCL  Evaluation of current treatment plan related to hypertension self management and patient's adherence to plan as established by provider Discussed medication management with transition to [place] Encouraged to continue to attend provider visits as scheduled  Hypothroidism Interventions Goal on track:  Yes. Long Term Evaluation of current treatment plan related to HTN and Hypothyroidism, Transportation self-management and patient's  adherence to plan as established by provider. Discussed plans with patient for ongoing care management follow up and provided patient with direct contact information for care management team Discuss plan of care for medication management as [place] Encouraged to continue to work with clinical pharmacist for [employer].  Falls Interventions:  Discussed transition to another level of care at independent living with additional support    Patient Goals/Self-Care Activities: Take all medications as prescribed Attend  all scheduled provider appointments Call provider office for new concerns or questions  Continue fall precaution strategies: change positions slowly, keep walkway clear, provide good lighting, use assistive device as recommended, remain physically active to maintain muscle strength and tone Continue to work with Care Management Team for disease management and/or care coordination needs Call RN Case Manager for continued disease management and care coordination services as needed   Plan:The patient has been provided with contact information for the care management team and has been advised to call with any health related questions or concerns.  Ms. Tonye Royalty to call back to schedule follow up appointment per her request.  Thea Silversmith, RN, MSN, BSN, CCM Care Management Coordinator [facility] [phone]

## 2021-05-21 DIAGNOSIS — I10 Essential (primary) hypertension: Secondary | ICD-10-CM | POA: Diagnosis not present

## 2021-05-21 DIAGNOSIS — E039 Hypothyroidism, unspecified: Secondary | ICD-10-CM

## 2021-06-02 ENCOUNTER — Telehealth: Payer: Self-pay | Admitting: Pharmacist

## 2021-06-02 ENCOUNTER — Ambulatory Visit (INDEPENDENT_AMBULATORY_CARE_PROVIDER_SITE_OTHER): Payer: Medicare Other | Admitting: Pharmacist

## 2021-06-02 DIAGNOSIS — I10 Essential (primary) hypertension: Secondary | ICD-10-CM

## 2021-06-02 DIAGNOSIS — E785 Hyperlipidemia, unspecified: Secondary | ICD-10-CM

## 2021-06-02 DIAGNOSIS — E039 Hypothyroidism, unspecified: Secondary | ICD-10-CM

## 2021-06-02 DIAGNOSIS — E559 Vitamin D deficiency, unspecified: Secondary | ICD-10-CM

## 2021-06-02 DIAGNOSIS — R413 Other amnesia: Secondary | ICD-10-CM

## 2021-06-02 MED ORDER — LOSARTAN POTASSIUM 25 MG PO TABS: 25.0000 mg | ORAL_TABLET | Freq: Every day | ORAL | 1 refills | Status: DC

## 2021-06-02 NOTE — Telephone Encounter (Signed)
Patient and wife have moved into [place].  Daughter is requesting PT / OT for father thru [place] at [facility].  She also is requesting prescription for 4 wheeled walker.   Daughter is Ok to make appointment if face to face is needed.

## 2021-06-03 NOTE — Telephone Encounter (Signed)
Tried to contact daughter to discuss and make appt. No answer and unable to LM °

## 2021-06-05 NOTE — Patient Instructions (Signed)
[name] / [name],  It was a pleasure speaking with you.  I have attached a summary of our visit and information about your health goals.   Our next appointment is by telephone on July 07, 2021 at 3:30pm  Please call the care guide team at [phone] if you need to cancel or reschedule your appointment.   [facility] called [facility] but they did not have a record of your father / [name] receiving annual flu vaccine - consider getting either form [facility] or providers office in future.  Dr Etter Sjogren will need a face to face visit (can be either in office of video visit) to make referral for physical therapy and for new walker. Please contact office to make an appointment once you know the schedule for [facility]'s transportation services.  Christian. Lindsey is due to have labs and if he comes in for an appointment we can check labs while he is here.   If you have any questions or concerns, please feel free to contact me either at the phone number below or with a MyChart message.    Cherre Robins, PharmD Clinical Pharmacist [facility] [phone] (direct line)  [phone] (main office number)   Care Plan:   Hypertension / Edema: Controlled; blood pressure goal <130/80  BP Readings from Last 3 Encounters:  03/01/21 106/60  09/07/20 128/60  08/13/20 (!) 144/64   Current treatment: Furosemide 20mg  - take 1 tablet daily (patient takes as needed for swelling)  Losartan 25mg  daily at lunch  Interventions:  Reviewed blood pressure goals Reviewed limiting intake of sodium for blood pressure and edema  Hyperlipidemia, mixed / histoy of TIA: LDL not at goal but Tg at goal; LDL goal <70 and Triglyceride goal <150  Lipid Panel     Component Value Date/Time   CHOL 166 02/25/2021 1059   TRIG 60.0 02/25/2021 1059   HDL 56.30 02/25/2021 1059   CHOLHDL 3 02/25/2021 1059   VLDL 12.0 02/25/2021 1059   LDLCALC 98 02/25/2021 1059   LDLDIRECT 74.9 04/11/2011 0933    Current treatment:  Aspirin 81mg  daily (reports not taking 03/07/2021) Gemfibrozil 600mg  once a day at lunch Interventions: Discussed lipid goals Continue aspirin 81mg  daily; discussed benefits with history of TIA / stroke  Hypothyroidism:  Uncontrolled; Goal: TSH in therapeutic range Last TSH was elevated at 13.57 (02/25/2021) Patient noted to be non-adherent to levothyroxine Current treatment:  Levothyroxine 123mcg take 1 tablet each morning Interventions: Discussed importance of taking levothyroxine daily  Past due to recheck TSH - call for appointment once you know about [facility]'s transportation schedule.  Shoulder pain:  Controlled;  Current treatment:  Tramadol 50mg  - take 0.5 tablet = 25mg  up to every 6 hours as needed Patient rarely takes tramadol but per daughter is in need of refill. Last refilled #30 on 08/13/2020 Interventions:  Continue to take tramadol if needed   Osteopenia with low vitamin D:  Last DEXA 2012 showed T-Score of -1.8 at neck of right femur Noted to have frequent falls due to balance but no fractures Current therapy:  Multivitamin daily  Previous therapies: alendronate, vitamin D supplement, calcium supplement (stopped due to constipation) Has history of low vitamin D but last serum vitamin D was 49.4 - normal range (04/13/2020) Interventions:  Consider rechecking serum vitamin D with next labs Fall prevention   Health Maintenance:  Discussed getting flu vaccine - patient's daughter [facility] flu vaccine. Recommended get ASAP. Discussed getting Shingrix / shingles vaccine  Medication management Pharmacist Clinical Goal(s): Over the next 90 days, patient will work with PharmD and providers to maintain optimal medication adherence [facility] Interventions Comprehensive medication review performed. Reviewed refill history and assessed adherence Continue current  medication management strategy Patient self care activities - Over the next 90 days, patient will: Focus on medication adherence by filling medications appropriately  Purchase weekly pill container with alarms / reminders Take medications as prescribed Report any questions or concerns to PharmD and/or provider(s)   Patient Goals/Self-Care Activities Over the next 90 days, patient will:  Take medications as prescribed, Focus on medication adherence by using weekly pill container with alarms Contact provider or clinical pharmacist with medication questions.  Recheck thyroid labs and serum vitamin D as soon as possible   Follow Up Plan: Telephone follow up appointment with care management team member scheduled for:  1 month with clinical pharmacist   Patient verbalizes understanding of instructions and care plan provided today and agrees to view in [place]. Active MyChart status confirmed with patient.

## 2021-06-05 NOTE — Chronic Care Management (AMB) (Signed)
Chronic Care Management Pharmacy Note  06/05/2021 Name:  [name] MRN:  [id number] DOB:  [birth year]  Summary: Discussed medication adherence. Adherence for levothyroxine improved but not 100% yet.  Updated prescription for losartan to 70m daily (dose lowered 03/2021 by Dr [name]  Reminded patient's daughter to have lab rechecked TSH and vitamin D Consulted with PCP regarding physical therapy and walker order - patient will need face to face visit to order. Patient's daughter to contact HAlfredo Bachfor transportation scheduled and then will call back to make appointment.  Discussed getting flu vaccine - patient's [facility]. Recommended get ASAP.  Subjective: CNaveed Humphresis an 88y.o. year old male who is a primary patient of Christian Held DO.  The CCM team was consulted for assistance with disease management and care coordination needs.    Engaged with patient's daughter by telephone for follow up visit in response to provider referral for pharmacy case management and/or care coordination services.   Consent to Services:  The patient was given information about Chronic Care Management services, agreed to services, and gave verbal consent prior to initiation of services.  Please see initial visit note for detailed documentation.   Patient Care Team: LCarollee Lindsey YAlferd Apa DO as PCP - General (Family Medicine) ECherre Robins RPH-CPP (Pharmacist)  Recent office visits: 03/01/2021 - PCP (Dr [name] Preventative Health Visit. Changed losartan HCTZ to just losartan 550mdaily due to fatigue, Christian BP and incresaed urination. Christian adhernece to thyroid replacement therapy. TSH was 13.57. Restarted levothyroxine 10057mdaily   09/07/2020 - PCP (Dr [name] visit for pansinusitis - prescribed doxycycline 100m87mice a day for 10 days and fluticsone nasal spray   Recent consult visits: None in last  6 months  Hospital visits: None in previous 6 months  Objective:  Lab Results  Component Value Date   CREATININE 1.75 (H) 02/25/2021   CREATININE 1.08 08/13/2020   CREATININE 1.24 08/12/2020    Lab Results  Component Value Date   HGBA1C 6.3 (H) 12/11/2017   Last diabetic Eye exam: No results found for: HMDIABEYEEXA  Last diabetic Foot exam: No results found for: HMDIABFOOTEX      Component Value Date/Time   CHOL 166 02/25/2021 1059   TRIG 60.0 02/25/2021 1059   HDL 56.30 02/25/2021 1059   CHOLHDL 3 02/25/2021 1059   VLDL 12.0 02/25/2021 1059   LDLCALC 98 02/25/2021 1059   LDLDIRECT 74.9 04/11/2011 0933    Hepatic Function Latest Ref Rng & Units 02/25/2021 08/07/2020 08/06/2020  Total Protein 6.0 - 8.3 g/dL 7.5 8.4(H) 7.6  Albumin 3.5 - 5.2 g/dL 4.3 4.0 4.5  AST 0 - 37 U/L 24 33 26  ALT 0 - 53 U/L _0 Alk Phosphatase 39 - 117 U/L 96 67 96  Total Bilirubin 0.2 - 1.2 mg/dL 0.3 0.6 0.5  Bilirubin, Direct 0.0 - 0.3 mg/dL - - -    Lab Results  Component Value Date/Time   TSH 13.57 (H) 02/25/2021 10:59 AM   TSH 1.90 04/13/2020 02:11 PM    CBC Latest Ref Rng & Units 02/25/2021 08/13/2020 08/12/2020  WBC 4.0 - 10.5 K/uL 7.3 2.5(L) 2.2(L)  Hemoglobin 13.0 - 17.0 g/dL 11.8(L) 10.9(L) 9.4(L)  Hematocrit 39.0 - 52.0 % 36.2(L) 32.6(L) 27.9(L)  Platelets 150.0 - 400.0 K/uL 237.0 113(L) 87(L)    Lab Results  Component Value Date/Time  VD25OH 49.40 04/13/2020 02:11 PM   VD25OH 43 11/15/2009 09:25 PM    Clinical ASCVD: Yes  The ASCVD Risk score (Arnett DK, et al., 2019) failed to calculate for the following reasons:   The 2019 ASCVD risk score is only valid for ages 20 to 74     Social History   Tobacco Use  Smoking Status Never  Smokeless Tobacco Never   BP Readings from Last 3 Encounters:  03/01/21 106/60  09/07/20 128/60  08/13/20 (!) 144/64   Pulse Readings from Last 3 Encounters:  03/01/21 72  09/07/20 66  08/13/20 60   Wt Readings from Last 3  Encounters:  03/01/21 116 lb 9.6 oz (52.9 kg)  09/07/20 121 lb 12.8 oz (55.2 kg)  08/09/20 125 lb (56.7 kg)    Assessment: Review of patient past medical history, allergies, medications, health status, including review of consultants reports, laboratory and other test data, was performed as part of comprehensive evaluation and provision of chronic care management services.   SDOH:  (Social Determinants of Health) assessments and interventions performed:  SDOH Interventions    Flowsheet Row Most Recent Value  SDOH Interventions   Financial Strain Interventions Intervention Not Indicated  Physical Activity Interventions Other (Comments)  [requesting order for physical therapy at independent living facility - will need face to face appointment with PCP 1st.]        CCM Care Plan  Allergies  Allergen Reactions   Methotrexate Derivatives Other (See Comments)    Mouth sores / pancytopenia   Asa [Aspirin] Other (See Comments)    Gastric symptoms - can tolerate 81 mg aspirin   Penicillin G Rash    Did it involve swelling of the face/tongue/throat, SOB, or Christian BP? Unknown Did it involve sudden or severe rash/hives, skin peeling, or any reaction on the inside of your mouth or nose? Unknown Did you need to seek medical attention at a hospital or doctor's office? Unknown When did it last happen?   unknown    If all above answers are NO, may proceed with cephalosporin use.    Medications Reviewed Today     Reviewed by Cherre Robins, RPH-CPP (Pharmacist) on 06/05/21 at [place] List Status: <None>   Medication Order Taking? Sig Documenting Provider Last Dose Status Informant  aspirin EC 81 MG tablet [id number] Yes Take 81 mg by mouth daily with lunch. Swallow whole. [provider] Taking Active   CALCIUM PO [id number] No Take 1 tablet by mouth every other day.  Patient not taking: Reported on 06/02/2021   [provider] Not Taking Active   Cholecalciferol (VITAMIN  D3 PO) [id number] No Take 1 tablet by mouth daily with lunch.  Patient not taking: Reported on 06/02/2021   [provider] Not Taking Active   docusate sodium (COLACE) 100 MG capsule [id number] Yes Take 200 mg by mouth daily. [provider] Taking Active Child  fexofenadine (ALLEGRA) 180 MG tablet [id number] Yes Take 180 mg by mouth daily. [provider] Taking Active   furosemide (LASIX) 20 MG tablet [id number] Yes Take 1 tablet (20 mg total) by mouth daily.  Patient taking differently: Take 20 mg by mouth daily as needed for fluid or edema.   [name], Kendrick Fries R, DO Taking Active   gemfibrozil (LOPID) 600 MG tablet [id number] Yes TAKE 1 TABLET BY MOUTH EVERY DAY [name], Alferd Apa, DO Taking Active   guaiFENesin (MUCINEX) 600 MG 12 hr tablet [id number] Yes Take 600 mg by mouth  as needed. [provider] Taking Active Child  levothyroxine (SYNTHROID) 100 MCG tablet [id number] Yes Take 1 tablet (100 mcg total) by mouth daily before breakfast. [name], Alferd Apa, DO Taking Active   losartan (COZAAR) 25 MG tablet [id number] Yes Take 1 tablet (25 mg total) by mouth daily. [name], DO Taking Active            Med Note Antony Contras, Marjie Skiff Jun 05, 2021  7:25 AM) Dose lowered 04/19/2021 by Dr Zenda Alpers - nephrologist  mirabegron ER (MYRBETRIQ) 25 MG TB24 tablet [id number] Yes Take 25 mg by mouth at bedtime. [provider] Taking Active Child  Multiple Vitamins-Minerals (MULTIVITAMIN ADULT) CHEW [id number] Yes Chew 2 each by mouth daily. [provider] Taking Active   polyvinyl alcohol (LIQUIFILM TEARS) 1.4 % ophthalmic solution [id number] Yes Place 1 drop into both eyes daily as needed for dry eyes. [provider] Taking Active Child  traMADol (ULTRAM) 50 MG tablet [id number] Yes Take 0.5 tablets (25 mg total) by mouth every 6 (six) hours as needed for severe pain. 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   Chronic kidney disease, stage 3a ([place]) 08/07/2020   Leukopenia 08/07/2020   Acute respiratory failure with hypoxia ([place]) 08/07/2020   SIRS (systemic inflammatory response syndrome) ([place]) 08/07/2020   Methotrexate adverse reaction, initial encounter    Acute pain of right shoulder 04/14/2020   Lower extremity edema 11/13/2019   Head trauma 11/13/2019   Dyslipidemia 11/13/2019   Memory loss 07/28/2019   Chronic right shoulder pain 07/28/2019   Frequent falls 07/28/2019   Balance problem 07/28/2019   Dysphagia 07/28/2019   ICH (intracerebral hemorrhage) ([place]) 03/10/2019   Basilar artery stenosis with infarction ([place]) 12/18/2017   TIA (transient ischemic attack) 12/10/2017   Rheumatoid arthritis ([place])    Chest pain 06/02/2011   General medical examination 04/11/2011   PEPTIC ULCER DISEASE, HELICOBACTER PYLORI POSITIVE 07/12/2010   PERSONAL HISTORY MALIG NEOPLASM LARGE INTESTINE 05/18/2010   MITRAL VALVE DISORDERS 04/05/2009   HOARSENESS 12/04/2008   OSTEOPENIA 08/26/2008   HYPERTRIGLYCERIDEMIA 07/23/2008   EPISTAXIS 07/26/2007   ERECTILE DYSFUNCTION 02/22/2007   ECZEMA 12/07/2006   LEG CRAMPS 10/09/2006   Hypothyroidism 08/28/2006   Essential hypertension 08/28/2006   HAY FEVER 08/28/2006   INSOMNIA 08/28/2006    Immunization History  Administered Date(s) Administered   Fluad Quad(high Dose 65+) 04/13/2020   Influenza Split 02/21/2011, 02/27/2012, 02/20/2014   Influenza Whole 04/03/2007, 02/26/2008, 02/15/2009, 02/01/2010   Influenza, High Dose Seasonal PF 01/28/2016, 02/13/2019   Influenza,inj,Quad PF,6+ Mos 02/20/2014   PFIZER Comirnaty(Gray Top)Covid-19 Tri-Sucrose Vaccine 08/24/2020   PFIZER(Purple Top)SARS-COV-2 Vaccination 05/31/2019, 06/21/2019, 02/20/2020   Pfizer Covid-19 Vaccine Bivalent Booster 55yr & up 05/05/2021   Pneumococcal  Conjugate-13 07/23/2008, 02/11/2015   Pneumococcal Polysaccharide-23 07/23/2008   Td 11/15/2009   Td,absorbed, Preservative Free, Adult Use, Lf Unspecified 11/15/2009   Tdap 03/09/2019    Conditions to be addressed/monitored: CAD, HTN, HLD, Hypertriglyceridemia, CKD Stage 3, Hypothyroidism, and Osteopenia  Care Plan : General Pharmacy (Adult)  Updates made by ECherre Robins RPH-CPP since 06/05/2021 12:00 AM     Problem: HTN; mixed hyperlipidemia; history of TIA; edema; hypothyroidism; memory loss; balance issues; osteopenia; anemia; RA; dyspepsia; colon CA   Priority: High  Onset Date: 03/07/2021  Long-Range Goal: Pharmacy Care Plan for Chronic Care Management   Start Date: 03/07/2021  Expected End Date: 08/24/2021  Priority: High  Note:   Current Barriers:  Unable to achieve control of hyperlipidemia and hypothyroidism  Unable to self administer medications as prescribed Does not adhere to prescribed medication regimen  Pharmacist Clinical Goal(s):  Over the next 90 days, patient will achieve adherence to monitoring guidelines and medication adherence to achieve therapeutic efficacy achieve control of hypothyroidism and hyperlipidemia as evidenced by TSH in therapeutic range and LDL <70 achieve ability to self administer medications as prescribed through use of weekly medication container with reminders as evidenced by patient report through collaboration with PharmD and provider.   Interventions: 1:1 collaboration with [name], Alferd Apa, DO regarding development and update of comprehensive plan of care as evidenced by provider attestation and co-signature Inter-disciplinary care team collaboration (see longitudinal plan of care) Comprehensive medication review performed; medication list updated in electronic medical record  Hypertension / Edema: Controlled; blood pressure goal <130/80 Current treatment: Furosemide 98m - take 1 tablet daily (patient takes as needed for  swelling)  Losartan 239mdaily at lunch  Denies hypotensive/hypertensive symptoms Daughter reports rarely has edema and rarely takes furosemide Daughter reported dose of losartan was decreased at last visit with Nephrologist.  Interventions:  Discussed blood pressure goals Discussed limiting intake of sodium for blood pressure and edema Requested copy of last OV with Dr PeZenda Alpersoffice if faxing over). Confirmed that dose of losartan was lowered to 253maily. Updated med list in EMR.  Hyperlipidemia, mixed / histoy of TIA: LDL not at goal but Tg at goal; LDL goal <70 and Triglyceride goal <150 Last LDL was 98; Tg was 60 TIA noted 11/2017 Current treatment:  Aspirin 3m82mily Gemfibrozil 600mg39me a day at lunch No history of taking statin in past Took tricagrelor in past after TIA Has restarted aspirin since our last visit.  Interventions: Discussed lipid goals Continue aspirin 3mg 85my; Reviewed aspirin benefits with history of TIA. Consider statin in future if LDL still >70 (taking into consideration patient's age and memory changes). Might be able to stop gemfibrozil.   Hypothyroidism:  Uncontrolled; Goal: TSH in therapeutic range Last TSH was elevated at 13.57 (02/25/2021) Patient noted to be non-adherent to levothyroxine - per daughter adherence has improved but is not 100% She has hired a medication assistant to help with daily medication administration at their new independent living facility.  Current treatment:  Levothyroxine 100mcg 55m 1 tablet each morning Interventions: Discussed symptoms of Christian thyroid Discussed importance of taking levothyroxine daily  Due to recheck TSH. Reminded daughter to make appointment. She is checking in to [employer] transportation schedule and will contact office for appointment.   Shoulder pain:  Controlled;  Current treatment:  Tramadol 50mg - 74m 0.5 tablet = 25mg up 55mvery 6 hours as  needed Patient rarely takes tramadol.  Interventions:  Continue to take tramadol as needed  Osteopenia with Christian vitamin D:  Last DEXA 2012 showed T-Score of -1.8 at neck of right femur Noted to have frequent falls due to balance but no fractures Current therapy:  Multivitamin daily  Previous therapies: alendronate, vitamin D supplement, calcium supplement (stopped due to constipation) Has history of Christian vitamin D but last serum vitamin D was 49.4 (04/13/2020) Interventions:  Consider rechecking serum vitamin D with next labs Could consider rechecking DEXA if would change treatment plan  Fall prevention   Health Maintenance:  Reviewed  vaccination history and discussed benefits of annual influenza and Shingrix / shingles vaccinations Discussed getting flu vaccine - patient's daughter [facility]. Recommended get ASAP. Discussed getting Shingrix / shingles - now covered by Medicare  SDOH:  Daughter is requesting physical therapy for Mr. Ardizzone due to deconditioning. She also would like a 4 wheeled walker Interventions:  Discussed requests with PCP. Patient will need a face to face appointment for these orders. Per PCP can be either in person or video visit. Daughter was notified and is checking with [employer] [place]'s transportation schedule before making appointment.   Medication management Pharmacist Clinical Goal(s): Over the next 90 days, patient will work with PharmD and providers to maintain optimal medication adherence [facility] Interventions Comprehensive medication review performed. Updated medication list  Reviewed refill history and assessed adherence Continue current medication management strategy Patient self care activities - Over the next 90 days, patient will: Focus on medication adherence by filling medications appropriately  Purchase weekly pill container with alarms / reminders Take  medications as prescribed Report any questions or concerns to PharmD and/or provider(s)   Patient Goals/Self-Care Activities Over the next 90 days, patient will:  Take medications as prescribed, Focus on medication adherence by using weekly pill container with alarms Contact provider or clinical pharmacist with medication questions.  Recheck thyroid labs and serum vitamin D as soon as possible  Follow Up Plan: Telephone follow up appointment with care management team member scheduled for:  2 months; [facility] nurse case manager will follow up with patient in 1 month.         Medication Assistance: None required.  Patient affirms current coverage meets needs.  [id number] [place] [place]: [phone]: [phone] [facility] [facility] [facility] [place]Phone: [phone]: [phone]  Uses pill box? Yes Daughter endorses 85 to 90% compliance but she cannot be sure  Follow Up:  Patient agrees to Care Plan and Follow-up.  Plan: Telephone follow up appointment with care management team member scheduled for:  2 months  TCherre Robins PharmD Clinical Pharmacist [facility]

## 2021-06-05 NOTE — Addendum Note (Signed)
Addended by: Cherre Robins B on: 06/05/2021 08:41 AM   Modules accepted: Orders

## 2021-06-06 NOTE — Telephone Encounter (Signed)
Appt made for 06/09/2021 for virtual visit.  Daughter also notified that [facility] not [facility] for 2022 - 23 season

## 2021-06-09 ENCOUNTER — Encounter: Payer: Self-pay | Admitting: Family Medicine

## 2021-06-09 ENCOUNTER — Other Ambulatory Visit: Payer: Self-pay

## 2021-06-09 ENCOUNTER — Ambulatory Visit (HOSPITAL_BASED_OUTPATIENT_CLINIC_OR_DEPARTMENT_OTHER)
Admission: RE | Admit: 2021-06-09 | Discharge: 2021-06-09 | Disposition: A | Discharge status: Home / Self Care | Payer: Medicare Other | Source: Ambulatory Visit | Attending: Family Medicine | Admitting: Family Medicine

## 2021-06-09 ENCOUNTER — Ambulatory Visit (INDEPENDENT_AMBULATORY_CARE_PROVIDER_SITE_OTHER): Payer: Medicare Other | Admitting: Family Medicine

## 2021-06-09 VITALS — BP 110/68 | HR 71 | Temp 99.1°F | Resp 18 | Ht 60.0 in

## 2021-06-09 DIAGNOSIS — D702 Other drug-induced agranulocytosis: Secondary | ICD-10-CM | POA: Diagnosis not present

## 2021-06-09 DIAGNOSIS — R059 Cough, unspecified: Secondary | ICD-10-CM | POA: Diagnosis not present

## 2021-06-09 DIAGNOSIS — E785 Hyperlipidemia, unspecified: Secondary | ICD-10-CM | POA: Diagnosis not present

## 2021-06-09 DIAGNOSIS — J40 Bronchitis, not specified as acute or chronic: Secondary | ICD-10-CM | POA: Insufficient documentation

## 2021-06-09 DIAGNOSIS — E039 Hypothyroidism, unspecified: Secondary | ICD-10-CM

## 2021-06-09 DIAGNOSIS — J9601 Acute respiratory failure with hypoxia: Secondary | ICD-10-CM

## 2021-06-09 DIAGNOSIS — N1831 Chronic kidney disease, stage 3a: Secondary | ICD-10-CM | POA: Diagnosis not present

## 2021-06-09 DIAGNOSIS — I771 Stricture of artery: Secondary | ICD-10-CM

## 2021-06-09 DIAGNOSIS — M069 Rheumatoid arthritis, unspecified: Secondary | ICD-10-CM

## 2021-06-09 DIAGNOSIS — S06360A Traumatic hemorrhage of cerebrum, unspecified, without loss of consciousness, initial encounter: Secondary | ICD-10-CM

## 2021-06-09 DIAGNOSIS — I10 Essential (primary) hypertension: Secondary | ICD-10-CM | POA: Diagnosis not present

## 2021-06-09 DIAGNOSIS — C182 Malignant neoplasm of ascending colon: Secondary | ICD-10-CM

## 2021-06-09 DIAGNOSIS — R2689 Other abnormalities of gait and mobility: Secondary | ICD-10-CM

## 2021-06-09 LAB — COMPREHENSIVE METABOLIC PANEL
ALT: 8 U/L (ref 0–53)
AST: 15 U/L (ref 0–37)
Albumin: 4.1 g/dL (ref 3.5–5.2)
Alkaline Phosphatase: 68 U/L (ref 39–117)
BUN: 33 mg/dL — ABNORMAL HIGH (ref 6–23)
CO2: 25 mEq/L (ref 19–32)
Calcium: 10 mg/dL (ref 8.4–10.5)
Chloride: 108 mEq/L (ref 96–112)
Creatinine, Ser: 1.87 mg/dL — ABNORMAL HIGH (ref 0.40–1.50)
GFR: 31.15 mL/min — ABNORMAL LOW (ref >60.00)
Glucose, Bld: 92 mg/dL (ref 70–99)
Potassium: 4.2 mEq/L (ref 3.5–5.1)
Sodium: 142 mEq/L (ref 135–145)
Total Bilirubin: 0.3 mg/dL (ref 0.2–1.2)
Total Protein: 7.2 g/dL (ref 6.0–8.3)

## 2021-06-09 LAB — LIPID PANEL
Cholesterol: 162 mg/dL (ref 0–200)
HDL: 51 mg/dL (ref >39.00)
LDL Cholesterol: 94 mg/dL (ref 0–99)
NonHDL: 111.28
Total CHOL/HDL Ratio: 3
Triglycerides: 86 mg/dL (ref 0.0–149.0)
VLDL: 17.2 mg/dL (ref 0.0–40.0)

## 2021-06-09 LAB — CBC WITH DIFFERENTIAL/PLATELET
Basophils Absolute: 0 10*3/uL (ref 0.0–0.1)
Basophils Relative: 0.5 % (ref 0.0–3.0)
Eosinophils Absolute: 0.5 10*3/uL (ref 0.0–0.7)
Eosinophils Relative: 5.3 % — ABNORMAL HIGH (ref 0.0–5.0)
HCT: 34.9 % — ABNORMAL LOW (ref 39.0–52.0)
Hemoglobin: 11.4 g/dL — ABNORMAL LOW (ref 13.0–17.0)
Lymphocytes Relative: 25.6 % (ref 12.0–46.0)
Lymphs Abs: 2.3 10*3/uL (ref 0.7–4.0)
MCHC: 32.7 g/dL (ref 30.0–36.0)
MCV: 89.8 fl (ref 78.0–100.0)
Monocytes Absolute: 0.9 10*3/uL (ref 0.1–1.0)
Monocytes Relative: 9.9 % (ref 3.0–12.0)
Neutro Abs: 5.2 10*3/uL (ref 1.4–7.7)
Neutrophils Relative %: 58.7 % (ref 43.0–77.0)
Platelets: 224 10*3/uL (ref 150.0–400.0)
RBC: 3.89 Mil/uL — ABNORMAL LOW (ref 4.22–5.81)
RDW: 13.3 % (ref 11.5–15.5)
WBC: 8.9 10*3/uL (ref 4.0–10.5)

## 2021-06-09 LAB — TSH: TSH: 1.31 u[IU]/mL (ref 0.35–5.50)

## 2021-06-09 MED ORDER — BENZONATATE 200 MG PO CAPS: 200.0000 mg | ORAL_CAPSULE | Freq: Three times a day (TID) | ORAL | 0 refills | Status: DC | PRN

## 2021-06-09 MED ORDER — AZITHROMYCIN 250 MG PO TABS: ORAL_TABLET | ORAL | 0 refills | Status: DC

## 2021-06-09 MED ORDER — FLUTICASONE PROPIONATE 50 MCG/ACT NA SUSP
2.0000 | Freq: Every day | NASAL | 6 refills | Status: DC
Start: 2021-06-09 — End: 2021-06-29

## 2021-06-09 MED ORDER — NONFORMULARY OR COMPOUNDED ITEM: 0 refills | Status: AC

## 2021-06-09 NOTE — Patient Instructions (Signed)
Acute Bronchitis, Adult °Acute bronchitis is sudden inflammation of the main airways (bronchi) that come off the windpipe (trachea) in the lungs. The swelling causes the airways to get smaller and make more mucus than normal. This can make it hard to breathe and can cause coughing or noisy breathing (wheezing). °Acute bronchitis may last several weeks. The cough may last longer. Allergies, asthma, and exposure to smoke may make the condition worse. °What are the causes? °This condition can be caused by germs and by substances that irritate the lungs, including: °Cold and flu viruses. The most common cause of this condition is the virus that causes the common cold. °Bacteria. This is less common. °Breathing in substances that irritate the lungs, including: °Smoke from cigarettes and other forms of tobacco. °Dust and pollen. °Fumes from household cleaning products, gases, or burned fuel. °Indoor or outdoor air pollution. °What increases the risk? °The following factors may make you more likely to develop this condition: °A weak body's defense system, also called the immune system. °A condition that affects your lungs and breathing, such as asthma. °What are the signs or symptoms? °Common symptoms of this condition include: °Coughing. This may bring up clear, yellow, or green mucus from your lungs (sputum). °Wheezing. °Runny or stuffy nose. °Having too much mucus in your lungs (chest congestion). °Shortness of breath. °Aches and pains, including sore throat or chest. °How is this diagnosed? °This condition is usually diagnosed based on: °Your symptoms and medical history. °A physical exam. °You may also have other tests, including tests to rule out other conditions, such as pneumonia. These tests include: °A test of lung function. °Test of a mucus sample to look for the presence of bacteria. °Tests to check the oxygen level in your blood. °Blood tests. °Chest X-ray. °How is this treated? °Most cases of acute bronchitis  clear up over time without treatment. Your health care provider may recommend: °Drinking more fluids to help thin your mucus so it is easier to cough up. °Taking inhaled medicine (inhaler) to improve air flow in and out of your lungs. °Using a vaporizer or a humidifier. These are machines that add water to the air to help you breathe better. °Taking a medicine that thins mucus and clears congestion (expectorant). °Taking a medicine that prevents or stops coughing (cough suppressant). °It is notcommon to take an antibiotic medicine for this condition. °Follow these instructions at home: ° °Take over-the-counter and prescription medicines only as told by your health care provider. °Use an inhaler, vaporizer, or humidifier as told by your health care provider. °Take two teaspoons (10 mL) of honey at bedtime to lessen coughing at night. °Drink enough fluid to keep your urine pale yellow. °Do not use any products that contain nicotine or tobacco. These products include cigarettes, chewing tobacco, and vaping devices, such as e-cigarettes. If you need help quitting, ask your health care provider. °Get plenty of rest. °Return to your normal activities as told by your health care provider. Ask your health care provider what activities are safe for you. °Keep all follow-up visits. This is important. °How is this prevented? °To lower your risk of getting this condition again: °Wash your hands often with soap and water for at least 20 seconds. If soap and water are not available, use hand sanitizer. °Avoid contact with people who have cold symptoms. °Try not to touch your mouth, nose, or eyes with your hands. °Avoid breathing in smoke or chemical fumes. Breathing smoke or chemical fumes will make your condition   worse. °Get the flu shot every year. °Contact a health care provider if: °Your symptoms do not improve after 2 weeks. °You have trouble coughing up the mucus. °Your cough keeps you awake at night. °You have a  fever. °Get help right away if you: °Cough up blood. °Feel pain in your chest. °Have severe shortness of breath. °Faint or keep feeling like you are going to faint. °Have a severe headache. °Have a fever or chills that get worse. °These symptoms may represent a serious problem that is an emergency. Do not wait to see if the symptoms will go away. Get medical help right away. Call your local emergency services (911 in the [place] Do not drive yourself to the hospital. °Summary °Acute bronchitis is inflammation of the main airways (bronchi) that come off the windpipe (trachea) in the lungs. The swelling causes the airways to get smaller and make more mucus than normal. °Drinking more fluids can help thin your mucus so it is easier to cough up. °Take over-the-counter and prescription medicines only as told by your health care provider. °Do not use any products that contain nicotine or tobacco. These products include cigarettes, chewing tobacco, and vaping devices, such as e-cigarettes. If you need help quitting, ask your health care provider. °Contact a health care provider if your symptoms do not improve after 2 weeks. °This information is not intended to replace advice given to you by your health care provider. Make sure you discuss any questions you have with your health care provider. °Document Revised: 09/08/2020 Document Reviewed: 09/08/2020 °Elsevier Patient Education © 2022 [employer] ° °

## 2021-06-09 NOTE — Assessment & Plan Note (Signed)
Check labs today.

## 2021-06-09 NOTE — Assessment & Plan Note (Signed)
Well controlled, no changes to meds. Encouraged heart healthy diet such as the DASH diet and exercise as tolerated.  °

## 2021-06-09 NOTE — Assessment & Plan Note (Signed)
Pt ordered  Walker rx printed

## 2021-06-09 NOTE — Progress Notes (Signed)
Established Patient Office Visit  Subjective:  Patient ID: [name], male    DOB: 07-23-1928  Age: 88 y.o. MRN: [id number]  CC:  Chief Complaint  Patient presents with   Cough    X3 months, dry cough,     HPI [name] presents for dry cough, no fever.  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 F/U with cancer center and Dr. Lucia Gaskins rotinely   Depression    Eczema    Epistaxis    f/u per ENT   Erectile dysfunction    Gastric ulcer 12/11   H-Pylori Tx, EGD 3-12: gastritis   Hay fever    Head injury 11/2017   with fall   Hyperlipemia    Hypertension    Hypothyroidism    Hypothyroid   Insomnia    Osteopenia     per DEXA 07-2008 (Rx fosamax)   Rheumatoid arthritis ([place])    Stroke ([place])     " balance issue"    Past Surgical History:  Procedure Laterality Date   COLON SURGERY     Right / Adenocarcinoma / Chemo   COLONOSCOPY  2005, 2008   Dr. Lucia Gaskins   INGUINAL HERNIA REPAIR     Right   IR ANGIO INTRA EXTRACRAN SEL COM CAROTID INNOMINATE BILAT MOD SED  12/13/2017   IR ANGIO VERTEBRAL SEL VERTEBRAL BILAT MOD SED  12/13/2017   IR PTA INTRACRANIAL  12/18/2017   RADIOLOGY WITH ANESTHESIA N/A 12/18/2017   Procedure: Angioplasty with stenting;  Surgeon: Luanne Bras, MD;  Location: [facility];  Service: Radiology;  Laterality: N/A;    Family History  Problem Relation Age of Onset   Diabetes Neg Hx    Heart attack Neg Hx    Colon cancer Neg Hx    Prostate cancer Neg Hx     Social History   Socioeconomic History   Marital status: Married    Spouse name: Not on file   Number of children: 2   Years of education: Not on file   Highest education level: Not  on file  Occupational History    Employer: RETIRED  Tobacco Use   Smoking status: Never   Smokeless tobacco: Never  Vaping Use   Vaping Use: Never used  Substance and Sexual Activity   Alcohol use: No    Alcohol/week: 0.0 standard drinks   Drug use: No   Sexual activity: Not on file  Other Topics Concern   Not on file  Social History Narrative   Original from [place]   Vegetarian   Daily caffeine use one per day   Social Determinants of Health   Financial Resource Strain: Low Risk    Difficulty of Paying Living Expenses: Not hard at all  Food Insecurity: Not on file  Transportation Needs: Not on file  Physical Activity: Inactive   Days of Exercise per Week: 0 days   Minutes of Exercise per Session: 0 min  Stress: Not on file  Social Connections: Not on file  Intimate Partner Violence: Not on file    Outpatient Medications Prior to Visit  Medication Sig Dispense Refill   aspirin EC  81 MG tablet Take 81 mg by mouth daily with lunch. Swallow whole.     docusate sodium (COLACE) 100 MG capsule Take 200 mg by mouth daily.     fexofenadine (ALLEGRA) 180 MG tablet Take 180 mg by mouth daily.     furosemide (LASIX) 20 MG tablet Take 1 tablet (20 mg total) by mouth daily. (Patient taking differently: Take 20 mg by mouth daily as needed for fluid or edema.) 90 tablet 1   gemfibrozil (LOPID) 600 MG tablet TAKE 1 TABLET BY MOUTH EVERY DAY 90 tablet 0   guaiFENesin (MUCINEX) 600 MG 12 hr tablet Take 600 mg by mouth as needed.     levothyroxine (SYNTHROID) 100 MCG tablet Take 1 tablet (100 mcg total) by mouth daily before breakfast. 90 tablet 1   losartan (COZAAR) 25 MG tablet Take 1 tablet (25 mg total) by mouth daily. 90 tablet 1   mirabegron ER (MYRBETRIQ) 25 MG TB24 tablet Take 25 mg by mouth at bedtime.     Multiple Vitamins-Minerals (MULTIVITAMIN ADULT) CHEW Chew 2 each by mouth daily.     polyvinyl alcohol (LIQUIFILM TEARS) 1.4 % ophthalmic solution Place 1 drop into both eyes  daily as needed for dry eyes.     traMADol (ULTRAM) 50 MG tablet Take 0.5 tablets (25 mg total) by mouth every 6 (six) hours as needed for severe pain. 30 tablet 0   CALCIUM PO Take 1 tablet by mouth every other day. (Patient not taking: Reported on 06/02/2021)     Cholecalciferol (VITAMIN D3 PO) Take 1 tablet by mouth daily with lunch. (Patient not taking: Reported on 06/02/2021)     No facility-administered medications prior to visit.    Allergies  Allergen Reactions   Methotrexate Derivatives Other (See Comments)    Mouth sores / pancytopenia   Asa [Aspirin] Other (See Comments)    Gastric symptoms - can tolerate 81 mg aspirin   Penicillin G Rash    Did it involve swelling of the face/tongue/throat, SOB, or low BP? Unknown Did it involve sudden or severe rash/hives, skin peeling, or any reaction on the inside of your mouth or nose? Unknown Did you need to seek medical attention at a hospital or doctor's office? Unknown When did it last happen?   unknown    If all above answers are NO, may proceed with cephalosporin use.    ROS Review of Systems  Constitutional:  Negative for fever.  HENT:  Negative for congestion.   Respiratory:  Positive for cough. Negative for shortness of breath.   Cardiovascular:  Negative for chest pain, palpitations and leg swelling.  Gastrointestinal:  Negative for abdominal pain, blood in stool and nausea.  Genitourinary:  Negative for dysuria and frequency.  Musculoskeletal:  Positive for gait problem.  Skin:  Negative for rash.  Allergic/Immunologic: Negative for environmental allergies.  Neurological:  Positive for weakness. Negative for dizziness and headaches.  Psychiatric/Behavioral:  The patient is not nervous/anxious.      Objective:    Physical Exam Vitals and nursing note reviewed.  Constitutional:      Appearance: He is well-developed.  HENT:     Head: Normocephalic and atraumatic.  Eyes:     Pupils: Pupils are equal, round, and  reactive to light.  Neck:     Thyroid: No thyromegaly.  Cardiovascular:     Rate and Rhythm: Normal rate and regular rhythm.     Heart sounds: No murmur heard. Pulmonary:     Effort: Pulmonary effort  is normal. No respiratory distress.     Breath sounds: Normal breath sounds. No wheezing or rales.  Chest:     Chest wall: No tenderness.  Musculoskeletal:        General: No tenderness.     Cervical back: Normal range of motion and neck supple.  Skin:    General: Skin is warm and dry.  Neurological:     Mental Status: He is alert and oriented to person, place, and time.  Psychiatric:        Behavior: Behavior normal.        Thought Content: Thought content normal.        Judgment: Judgment normal.    BP 110/68 (BP Location: Left Arm, Patient Position: Sitting, Cuff Size: Normal)    Pulse 71    Temp 99.1 F (37.3 C) (Oral)    Resp 18    Ht 5' (1.524 m)    SpO2 97%    BMI 22.77 kg/m  Wt Readings from Last 3 Encounters:  03/01/21 116 lb 9.6 oz (52.9 kg)  09/07/20 121 lb 12.8 oz (55.2 kg)  08/09/20 125 lb (56.7 kg)     Health Maintenance Due  Topic Date Due   Zoster Vaccines- Shingrix (1 of 2) Never done   INFLUENZA VACCINE  12/20/2020    There are no preventive care reminders to display for this patient.  Lab Results  Component Value Date   TSH 13.57 (H) 02/25/2021   Lab Results  Component Value Date   WBC 7.3 02/25/2021   HGB 11.8 (L) 02/25/2021   HCT 36.2 (L) 02/25/2021   MCV 92.2 02/25/2021   PLT 237.0 02/25/2021   Lab Results  Component Value Date   NA 142 02/25/2021   K 4.2 02/25/2021   CO2 28 02/25/2021   GLUCOSE 87 02/25/2021   BUN 45 (H) 02/25/2021   CREATININE 1.75 (H) 02/25/2021   BILITOT 0.3 02/25/2021   ALKPHOS 96 02/25/2021   AST 24 02/25/2021   ALT 12 02/25/2021   PROT 7.5 02/25/2021   ALBUMIN 4.3 02/25/2021   CALCIUM 10.0 02/25/2021   ANIONGAP 7 08/13/2020   EGFR 55 (L) 08/06/2020   GFR 33.80 (L) 02/25/2021   Lab Results  Component  Value Date   CHOL 166 02/25/2021   Lab Results  Component Value Date   HDL 56.30 02/25/2021   Lab Results  Component Value Date   LDLCALC 98 02/25/2021   Lab Results  Component Value Date   TRIG 60.0 02/25/2021   Lab Results  Component Value Date   CHOLHDL 3 02/25/2021   Lab Results  Component Value Date   HGBA1C 6.3 (H) 12/11/2017      Assessment & Plan:   Problem List Items Addressed This Visit       Unprioritized   Rheumatoid arthritis ([place]) (Chronic)   Essential hypertension (Chronic)    Well controlled, no changes to meds. Encouraged heart healthy diet such as the DASH diet and exercise as tolerated.       Hypothyroidism (Chronic)    Check labs today      Relevant Orders   TSH   CBC with Differential/Platelet   Acute respiratory failure with hypoxia (HCC)   Balance problem    Pt ordered  Walker rx printed       Relevant Medications   NONFORMULARY OR COMPOUNDED ITEM   Other Relevant Orders   Ambulatory referral to [facility]   Chronic kidney disease, stage 3a ([place])  Check labs today      Malignant neoplasm of ascending colon (HCC)   Relevant Medications   azithromycin (ZITHROMAX Z-PAK) 250 MG tablet   Other drug-induced neutropenia (HCC)   Stenosis of artery (HCC)   Traumatic hemorrhage of cerebrum, unspecified, without loss of consciousness, initial encounter ([place])   Other Visit Diagnoses     Bronchitis    -  Primary   Relevant Medications   benzonatate (TESSALON) 200 MG capsule   azithromycin (ZITHROMAX Z-PAK) 250 MG tablet   fluticasone (FLONASE) 50 MCG/ACT nasal spray   Other Relevant Orders   DG Chest 2 View   Primary hypertension       Hyperlipidemia, unspecified hyperlipidemia type       Relevant Orders   TSH   Lipid panel   Comprehensive metabolic panel       Meds ordered this encounter  Medications   benzonatate (TESSALON) 200 MG capsule    Sig: Take 1 capsule (200 mg total) by mouth 3 (three) times daily as needed  for cough.    Dispense:  30 capsule    Refill:  0   azithromycin (ZITHROMAX Z-PAK) 250 MG tablet    Sig: As directed    Dispense:  6 each    Refill:  0   fluticasone (FLONASE) 50 MCG/ACT nasal spray    Sig: Place 2 sprays into both nostrils daily.    Dispense:  16 g    Refill:  6   NONFORMULARY OR COMPOUNDED ITEM    Sig: 4 wheeled walker   #1  dx frequent falls, weakness    Dispense:  1 each    Refill:  0    Follow-up: Return if symptoms worsen or fail to improve.    [name], DO

## 2021-06-10 NOTE — Addendum Note (Signed)
Addended by: [name] R on: 06/10/2021 01:07 PM   Modules accepted: Orders

## 2021-06-20 ENCOUNTER — Telehealth: Payer: Self-pay | Admitting: Family Medicine

## 2021-06-20 NOTE — Telephone Encounter (Signed)
Verbal given 

## 2021-06-20 NOTE — Telephone Encounter (Signed)
Called patient's daughter back. She was asking about PT / OT referral for [facility]. Looks like there is an open message to Dr Carollee Herter for this.  Patient's daughter advised that order if in the works. She can call office back if they have not heard anything more by 06/24/2021.

## 2021-06-20 NOTE — Telephone Encounter (Signed)
Ms. [name] pt's daughter would like for Tammy to return her phone call regarding pt. Tele: [phone]. Please advise.

## 2021-06-20 NOTE — Telephone Encounter (Signed)
[facility] is requesting physical and occupational therapy.  °

## 2021-06-21 DIAGNOSIS — R2689 Other abnormalities of gait and mobility: Secondary | ICD-10-CM | POA: Diagnosis not present

## 2021-06-21 DIAGNOSIS — I10 Essential (primary) hypertension: Secondary | ICD-10-CM

## 2021-06-21 DIAGNOSIS — E785 Hyperlipidemia, unspecified: Secondary | ICD-10-CM | POA: Diagnosis not present

## 2021-06-21 DIAGNOSIS — E039 Hypothyroidism, unspecified: Secondary | ICD-10-CM | POA: Diagnosis not present

## 2021-06-21 DIAGNOSIS — M6281 Muscle weakness (generalized): Secondary | ICD-10-CM | POA: Diagnosis not present

## 2021-06-22 DIAGNOSIS — R2689 Other abnormalities of gait and mobility: Secondary | ICD-10-CM | POA: Diagnosis not present

## 2021-06-22 DIAGNOSIS — M6281 Muscle weakness (generalized): Secondary | ICD-10-CM | POA: Diagnosis not present

## 2021-06-23 ENCOUNTER — Emergency Department (HOSPITAL_BASED_OUTPATIENT_CLINIC_OR_DEPARTMENT_OTHER): Payer: Medicare Other

## 2021-06-23 ENCOUNTER — Encounter (HOSPITAL_BASED_OUTPATIENT_CLINIC_OR_DEPARTMENT_OTHER): Payer: Self-pay | Admitting: Emergency Medicine

## 2021-06-23 ENCOUNTER — Emergency Department (HOSPITAL_BASED_OUTPATIENT_CLINIC_OR_DEPARTMENT_OTHER)
Admission: EM | Admit: 2021-06-23 | Discharge: 2021-06-23 | Disposition: A | Discharge status: Home / Self Care | Payer: Medicare Other | Attending: Emergency Medicine | Admitting: Emergency Medicine

## 2021-06-23 ENCOUNTER — Other Ambulatory Visit: Payer: Self-pay

## 2021-06-23 DIAGNOSIS — Y9389 Activity, other specified: Secondary | ICD-10-CM | POA: Insufficient documentation

## 2021-06-23 DIAGNOSIS — S80212A Abrasion, left knee, initial encounter: Secondary | ICD-10-CM | POA: Insufficient documentation

## 2021-06-23 DIAGNOSIS — S4980XA Other specified injuries of shoulder and upper arm, unspecified arm, initial encounter: Secondary | ICD-10-CM | POA: Diagnosis not present

## 2021-06-23 DIAGNOSIS — M25552 Pain in left hip: Secondary | ICD-10-CM | POA: Diagnosis not present

## 2021-06-23 DIAGNOSIS — W19XXXA Unspecified fall, initial encounter: Secondary | ICD-10-CM | POA: Diagnosis not present

## 2021-06-23 DIAGNOSIS — M1612 Unilateral primary osteoarthritis, left hip: Secondary | ICD-10-CM | POA: Diagnosis not present

## 2021-06-23 DIAGNOSIS — S8992XA Unspecified injury of left lower leg, initial encounter: Secondary | ICD-10-CM | POA: Diagnosis present

## 2021-06-23 DIAGNOSIS — Z043 Encounter for examination and observation following other accident: Secondary | ICD-10-CM | POA: Diagnosis not present

## 2021-06-23 NOTE — ED Notes (Signed)
ED Provider at bedside. 

## 2021-06-23 NOTE — ED Notes (Signed)
Assisted off stretcher, ambulated few steps with assist of 2, gait unsteady. Pain to left buttock area with walking

## 2021-06-23 NOTE — ED Provider Notes (Signed)
[facility] Provider Note   CSN: [id number] Arrival date & time: 06/23/21  5465     History  Chief Complaint  Patient presents with   [name] is a 88 y.o. male.  88 year old male brought in by EMS from [facility].  Patient states that he was making his bed this morning, got twisted in the blanket and he fell against the wall.  Patient did not hit his head, no loss of consciousness, is not anticoagulated.  Patient reports pain over his left hip area when he fell and this prevented him from being able to stand up however this has since resolved and he does not have any injuries or concerns at this time.      Home Medications Prior to Admission medications   Medication Sig Start Date End Date Taking? Authorizing Provider  aspirin EC 81 MG tablet Take 81 mg by mouth daily with lunch. Swallow whole.    [provider]  azithromycin (ZITHROMAX Z-PAK) 250 MG tablet As directed 06/09/21   Carollee Herter, Alferd Apa, DO  benzonatate (TESSALON) 200 MG capsule Take 1 capsule (200 mg total) by mouth 3 (three) times daily as needed for cough. 06/09/21   [name] R, DO  CALCIUM PO Take 1 tablet by mouth every other day. Patient not taking: Reported on 06/02/2021    [provider]  Cholecalciferol (VITAMIN D3 PO) Take 1 tablet by mouth daily with lunch. Patient not taking: Reported on 06/02/2021    [provider]  docusate sodium (COLACE) 100 MG capsule Take 200 mg by mouth daily.    [provider]  fexofenadine (ALLEGRA) 180 MG tablet Take 180 mg by mouth daily.    [provider]  fluticasone (FLONASE) 50 MCG/ACT nasal spray Place 2 sprays into both nostrils daily. 06/09/21   [name], DO  furosemide (LASIX) 20 MG tablet Take 1 tablet (20 mg total) by mouth daily. Patient taking differently: Take 20 mg by mouth daily as needed for fluid or edema. 05/13/20   [name] R, DO   gemfibrozil (LOPID) 600 MG tablet TAKE 1 TABLET BY MOUTH EVERY DAY 03/28/21   Carollee Herter, Alferd Apa, DO  guaiFENesin (MUCINEX) 600 MG 12 hr tablet Take 600 mg by mouth as needed.    [provider]  levothyroxine (SYNTHROID) 100 MCG tablet Take 1 tablet (100 mcg total) by mouth daily before breakfast. 03/01/21   Carollee Herter, Alferd Apa, DO  losartan (COZAAR) 25 MG tablet Take 1 tablet (25 mg total) by mouth daily. 06/02/21   [name], DO  mirabegron ER (MYRBETRIQ) 25 MG TB24 tablet Take 25 mg by mouth at bedtime.    [provider]  Multiple Vitamins-Minerals (MULTIVITAMIN ADULT) CHEW Chew 2 each by mouth daily.    [provider]  NONFORMULARY OR COMPOUNDED ITEM 4 wheeled walker   #1  dx frequent falls, weakness 06/09/21   Carollee Herter, Alferd Apa, DO  polyvinyl alcohol (LIQUIFILM TEARS) 1.4 % ophthalmic solution Place 1 drop into both eyes daily as needed for dry eyes.    [provider]  traMADol (ULTRAM) 50 MG tablet Take 0.5 tablets (25 mg total) by mouth every 6 (six) hours as needed for severe pain. 03/07/21   [name], DO      Allergies    Methotrexate derivatives, Asa [aspirin], and Penicillin g    Review of Systems  Review of Systems  Constitutional:  Negative for fever.  Respiratory:  Negative for shortness of breath.   Cardiovascular:  Negative for chest pain.  Gastrointestinal:  Negative for abdominal pain.  Musculoskeletal:  Negative for arthralgias, back pain, myalgias and neck pain.  Skin:  Positive for wound.  Allergic/Immunologic: Negative for immunocompromised state.  Neurological:  Negative for weakness, numbness and headaches.  Hematological:  Does not bruise/bleed easily.  Psychiatric/Behavioral:  Negative for confusion.    Physical Exam Updated Vital Signs BP (!) 159/63    Pulse 78    Temp 98.1 F (36.7 C) (Oral)    Resp 20    Ht 5\' 3"  (1.6 m)    Wt 59 kg    SpO2 98%    BMI 23.03 kg/m  Physical  Exam Vitals and nursing note reviewed.  Constitutional:      General: He is not in acute distress.    Appearance: He is well-developed. He is not diaphoretic.  HENT:     Head: Normocephalic and atraumatic.     Nose: Nose normal.     Mouth/Throat:     Mouth: Mucous membranes are moist.  Eyes:     Pupils: Pupils are equal, round, and reactive to light.  Pulmonary:     Effort: Pulmonary effort is normal.  Abdominal:     Palpations: Abdomen is soft.     Tenderness: There is no abdominal tenderness.  Musculoskeletal:        General: No swelling, tenderness or deformity.     Cervical back: Normal range of motion and neck supple. No tenderness or bony tenderness.     Thoracic back: No tenderness or bony tenderness.     Lumbar back: No tenderness or bony tenderness.     Right lower leg: No edema.     Left lower leg: No edema.     Comments: Very minor abrasion to the left knee otherwise normal range of motion of knees, hips, ankles as well as upper extremities.  No midline spine tenderness, normal range of motion of the neck.  Pelvis is stable and nontender.  Skin:    General: Skin is warm and dry.     Findings: No erythema or rash.  Neurological:     Mental Status: He is alert and oriented to person, place, and time.     Sensory: No sensory deficit.     Motor: No weakness.     Gait: Gait normal.  Psychiatric:        Behavior: Behavior normal.    ED Results / Procedures / Treatments   Labs (all labs ordered are listed, but only abnormal results are displayed) Labs Reviewed - No data to display  EKG None  Radiology DG Hip Unilat With Pelvis 2-3 Views Left  Result Date: 06/23/2021 CLINICAL DATA:  Fall EXAM: DG HIP (WITH OR WITHOUT PELVIS) 3V LEFT COMPARISON:  None. FINDINGS: There is no evidence of hip fracture or dislocation. Prior abdominal wall hernia repair. Degenerative changes of the partially visualized lower lumbar spine. Mild degenerative changes of the bilateral hips.  IMPRESSION: No acute osseous abnormality. Electronically Signed   By: Yetta Glassman M.D.   On: 06/23/2021 10:34    Procedures Procedures    Medications Ordered in ED Medications - No data to display  ED Course/ Medical Decision Making/ A&P                           Medical Decision  Making Amount and/or Complexity of Data Reviewed Radiology: ordered.   88 year old male brought in by EMS from independent living facility after he had a mechanical fall today and hit the wall with complaint of pain in his left hip area.  Patient was unable to stand after his fall today.  On arrival to emergency room, states pain has since resolved.  Exam is unremarkable, he has no pain with range of motion of upper or lower extremities including extensive range of motion with flexion of both knees and range of hips.  Pelvis is stable and nontender.  He does not have any midline neck or spine tenderness.  Denies hitting his head or loss of consciousness. Upon standing, does report pain in his left buttock area.  X-ray of the left hip is unremarkable. Patient is discharged back to facility with plan to recheck with PCP, return to ER for worsening or concerning symptoms.        Final Clinical Impression(s) / ED Diagnoses Final diagnoses:  Fall, initial encounter  Hip pain, acute, left    Rx / DC Orders ED Discharge Orders     None         Roque Lias 06/23/21 1210    Truddie Hidden, MD 06/25/21 [id number]

## 2021-06-23 NOTE — Discharge Instructions (Signed)
Recheck with your doctor if pain continues. Return to ER for worsening or concerning symptoms.

## 2021-06-23 NOTE — ED Notes (Signed)
Paper scrub pants put on patient. Patient pj pants put in a patient belonging bag at bedside.

## 2021-06-23 NOTE — ED Notes (Signed)
Awaiting Charm Rings, caregiver to arrive for transport home. Discharge instructions given to daughter, [name]'s

## 2021-06-23 NOTE — Telephone Encounter (Signed)
[facility] states they now need signed orders for physical and occupational therapy. They state they have sent it already, but will fax it again and that they need it to be faxed back to them asap.  °

## 2021-06-23 NOTE — ED Notes (Signed)
Spoke with daughter , [name] Hutchinson's , states she will contact his caregiver to attempt to provide transportation back to facility

## 2021-06-23 NOTE — ED Triage Notes (Signed)
Per EMS:  unwitnessed fall.  Pt from [facility].  Pt apparently tried to make his bed this am and got twisted in blanket and fell against the wall.  Denies head injury, no LOC.  Pt c/o left shoulder pain. VSS

## 2021-06-23 NOTE — ED Notes (Signed)
Patient transported to X-ray 

## 2021-06-24 ENCOUNTER — Emergency Department (HOSPITAL_COMMUNITY): Payer: Medicare Other

## 2021-06-24 ENCOUNTER — Other Ambulatory Visit: Payer: Self-pay

## 2021-06-24 ENCOUNTER — Encounter (HOSPITAL_COMMUNITY): Payer: Self-pay | Admitting: Emergency Medicine

## 2021-06-24 ENCOUNTER — Emergency Department (HOSPITAL_COMMUNITY)
Admission: EM | Admit: 2021-06-24 | Discharge: 2021-06-24 | Disposition: A | Discharge status: Skilled Nursing Facility | Payer: Medicare Other | Attending: Student | Admitting: Student

## 2021-06-24 DIAGNOSIS — Z85038 Personal history of other malignant neoplasm of large intestine: Secondary | ICD-10-CM | POA: Diagnosis not present

## 2021-06-24 DIAGNOSIS — Z20822 Contact with and (suspected) exposure to covid-19: Secondary | ICD-10-CM | POA: Insufficient documentation

## 2021-06-24 DIAGNOSIS — Z7982 Long term (current) use of aspirin: Secondary | ICD-10-CM | POA: Diagnosis not present

## 2021-06-24 DIAGNOSIS — E039 Hypothyroidism, unspecified: Secondary | ICD-10-CM | POA: Insufficient documentation

## 2021-06-24 DIAGNOSIS — I129 Hypertensive chronic kidney disease with stage 1 through stage 4 chronic kidney disease, or unspecified chronic kidney disease: Secondary | ICD-10-CM | POA: Insufficient documentation

## 2021-06-24 DIAGNOSIS — I6381 Other cerebral infarction due to occlusion or stenosis of small artery: Secondary | ICD-10-CM | POA: Diagnosis not present

## 2021-06-24 DIAGNOSIS — R531 Weakness: Secondary | ICD-10-CM | POA: Insufficient documentation

## 2021-06-24 DIAGNOSIS — R1110 Vomiting, unspecified: Secondary | ICD-10-CM | POA: Diagnosis not present

## 2021-06-24 DIAGNOSIS — R944 Abnormal results of kidney function studies: Secondary | ICD-10-CM | POA: Diagnosis not present

## 2021-06-24 DIAGNOSIS — N1831 Chronic kidney disease, stage 3a: Secondary | ICD-10-CM | POA: Diagnosis not present

## 2021-06-24 DIAGNOSIS — M1712 Unilateral primary osteoarthritis, left knee: Secondary | ICD-10-CM | POA: Diagnosis not present

## 2021-06-24 DIAGNOSIS — M25462 Effusion, left knee: Secondary | ICD-10-CM | POA: Diagnosis not present

## 2021-06-24 DIAGNOSIS — Z743 Need for continuous supervision: Secondary | ICD-10-CM | POA: Diagnosis not present

## 2021-06-24 DIAGNOSIS — S8992XA Unspecified injury of left lower leg, initial encounter: Secondary | ICD-10-CM | POA: Diagnosis not present

## 2021-06-24 DIAGNOSIS — M545 Low back pain, unspecified: Secondary | ICD-10-CM | POA: Diagnosis not present

## 2021-06-24 DIAGNOSIS — I10 Essential (primary) hypertension: Secondary | ICD-10-CM | POA: Diagnosis not present

## 2021-06-24 LAB — COMPREHENSIVE METABOLIC PANEL
ALT: 12 U/L (ref 0–44)
AST: 32 U/L (ref 15–41)
Albumin: 3.3 g/dL — ABNORMAL LOW (ref 3.5–5.0)
Alkaline Phosphatase: 65 U/L (ref 38–126)
Anion gap: 13 (ref 5–15)
BUN: 20 mg/dL (ref 8–23)
CO2: 19 mmol/L — ABNORMAL LOW (ref 22–32)
Calcium: 9.3 mg/dL (ref 8.9–10.3)
Chloride: 106 mmol/L (ref 98–111)
Creatinine, Ser: 1.41 mg/dL — ABNORMAL HIGH (ref 0.61–1.24)
GFR, Estimated: 47 mL/min — ABNORMAL LOW (ref >60)
Glucose, Bld: 97 mg/dL (ref 70–99)
Potassium: 4.9 mmol/L (ref 3.5–5.1)
Sodium: 138 mmol/L (ref 135–145)
Total Bilirubin: 1.3 mg/dL — ABNORMAL HIGH (ref 0.3–1.2)
Total Protein: 7.1 g/dL (ref 6.5–8.1)

## 2021-06-24 LAB — CBC WITH DIFFERENTIAL/PLATELET
Abs Immature Granulocytes: 0.03 10*3/uL (ref 0.00–0.07)
Basophils Absolute: 0 10*3/uL (ref 0.0–0.1)
Basophils Relative: 0 %
Eosinophils Absolute: 0.3 10*3/uL (ref 0.0–0.5)
Eosinophils Relative: 3 %
HCT: 40.9 % (ref 39.0–52.0)
Hemoglobin: 13.2 g/dL (ref 13.0–17.0)
Immature Granulocytes: 0 %
Lymphocytes Relative: 21 %
Lymphs Abs: 1.9 10*3/uL (ref 0.7–4.0)
MCH: 29.5 pg (ref 26.0–34.0)
MCHC: 32.3 g/dL (ref 30.0–36.0)
MCV: 91.5 fL (ref 80.0–100.0)
Monocytes Absolute: 0.8 10*3/uL (ref 0.1–1.0)
Monocytes Relative: 8 %
Neutro Abs: 6.1 10*3/uL (ref 1.7–7.7)
Neutrophils Relative %: 68 %
Platelets: 234 10*3/uL (ref 150–400)
RBC: 4.47 MIL/uL (ref 4.22–5.81)
RDW: 12.6 % (ref 11.5–15.5)
WBC: 9.2 10*3/uL (ref 4.0–10.5)
nRBC: 0 % (ref 0.0–0.2)

## 2021-06-24 LAB — URINALYSIS, ROUTINE W REFLEX MICROSCOPIC
Bilirubin Urine: NEGATIVE
Glucose, UA: NEGATIVE mg/dL
Hgb urine dipstick: NEGATIVE
Ketones, ur: NEGATIVE mg/dL
Leukocytes,Ua: NEGATIVE
Nitrite: NEGATIVE
Protein, ur: NEGATIVE mg/dL
Specific Gravity, Urine: 1.02 (ref 1.005–1.030)
pH: 6 (ref 5.0–8.0)

## 2021-06-24 LAB — RESP PANEL BY RT-PCR (FLU A&B, COVID) ARPGX2
Influenza A by PCR: NEGATIVE
Influenza B by PCR: NEGATIVE
SARS Coronavirus 2 by RT PCR: NEGATIVE

## 2021-06-24 NOTE — ED Notes (Signed)
[facility] called, \all required paperwork collected for when they arrive.

## 2021-06-24 NOTE — ED Notes (Signed)
T transported to [facility]

## 2021-06-24 NOTE — ED Provider Notes (Signed)
[facility] EMERGENCY DEPARTMENT Provider Note  CSN: [id number] Arrival date & time: 06/24/21 0725  Chief Complaint(s) Fatigue  HPI Babyboy Loya is a 88 y.o. male with PMH HLD, HTN, hypothyroidism, rheumatoid arthritis, previous CVA, colon adenocarcinoma status post right hemicolectomy and colostomy reversal who presents to the emergency department for evaluation of generalized weakness.  Patient was seen yesterday for a fall with negative x-ray imaging and was discharged back to his skilled nursing facility.  Facility called EMS this morning for generalized weakness as the patient slid off of his bed.  No true fall.  Patient states he had a single episode of vomiting this morning but denies chest pain, shortness of breath, headache, fever or other systemic symptoms.  Denies dysuria.  HPI  Past Medical History Past Medical History:  Diagnosis Date   Adenocarcinoma ([place]) 1985   colon s/p right hemicolectomy and chemotherapy. F/U with cancer center and Dr. Lucia Gaskins rotinely   Depression    Eczema    Epistaxis    f/u per ENT   Erectile dysfunction    Gastric ulcer 12/11   H-Pylori Tx, EGD 3-12: gastritis   Hay fever    Head injury 11/2017   with fall   Hyperlipemia    Hypertension    Hypothyroidism    Hypothyroid   Insomnia    Osteopenia     per DEXA 07-2008 (Rx fosamax)   Rheumatoid arthritis ([place])    Stroke ([place])     " balance issue"   Patient Active Problem List   Diagnosis Date Noted   Other drug-induced neutropenia ([place]) 06/09/2021   Malignant neoplasm of ascending colon ([place]) 06/09/2021   Stenosis of artery ([place]) 06/09/2021   Preventative health care 03/01/2021   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   Chronic kidney disease, stage 3a ([place]) 08/07/2020   Leukopenia 08/07/2020   Acute respiratory failure with hypoxia ([place]) 08/07/2020   SIRS (systemic inflammatory response syndrome) ([place]) 08/07/2020   Methotrexate adverse reaction, initial encounter    Acute  pain of right shoulder 04/14/2020   Lower extremity edema 11/13/2019   Head trauma 11/13/2019   Dyslipidemia 11/13/2019   Memory loss 07/28/2019   Chronic right shoulder pain 07/28/2019   Frequent falls 07/28/2019   Balance problem 07/28/2019   Dysphagia 07/28/2019   Traumatic hemorrhage of cerebrum, unspecified, without loss of consciousness, initial encounter [facility])    Basilar artery stenosis with infarction ([place]) 12/18/2017   TIA (transient ischemic attack) 12/10/2017   Rheumatoid arthritis ([place])    Chest pain 06/02/2011   General medical examination 04/11/2011   PEPTIC ULCER DISEASE, HELICOBACTER PYLORI POSITIVE 07/12/2010   PERSONAL HISTORY MALIG NEOPLASM LARGE INTESTINE 05/18/2010   MITRAL VALVE DISORDERS 04/05/2009   HOARSENESS 12/04/2008   OSTEOPENIA 08/26/2008   HYPERTRIGLYCERIDEMIA 07/23/2008   EPISTAXIS 07/26/2007   ERECTILE DYSFUNCTION 02/22/2007   ECZEMA 12/07/2006   LEG CRAMPS 10/09/2006   Hypothyroidism 08/28/2006   Essential hypertension 08/28/2006   HAY FEVER 08/28/2006   INSOMNIA 08/28/2006   Home Medication(s) Prior to Admission medications   Medication Sig Start Date End Date Taking? Authorizing Provider  aspirin EC 81 MG tablet Take 81 mg by mouth daily with lunch. Swallow whole.    [provider]  azithromycin (ZITHROMAX Z-PAK) 250 MG tablet As directed 06/09/21   Carollee Herter, Alferd Apa, DO  benzonatate (TESSALON) 200 MG capsule Take 1 capsule (200 mg total) by mouth 3 (three) times daily as needed for cough. 06/09/21   [name], DO  CALCIUM PO Take 1 tablet by mouth every other day. Patient not taking: Reported on 06/02/2021    [provider]  Cholecalciferol (VITAMIN D3 PO) Take 1 tablet by mouth daily with lunch. Patient not taking: Reported on 06/02/2021    [provider]  docusate sodium (COLACE) 100 MG capsule Take 200 mg by mouth daily.    [provider]  fexofenadine (ALLEGRA) 180 MG tablet Take 180  mg by mouth daily.    [provider]  fluticasone (FLONASE) 50 MCG/ACT nasal spray Place 2 sprays into both nostrils daily. 06/09/21   [name], DO  furosemide (LASIX) 20 MG tablet Take 1 tablet (20 mg total) by mouth daily. Patient taking differently: Take 20 mg by mouth daily as needed for fluid or edema. 05/13/20   [name] R, DO  gemfibrozil (LOPID) 600 MG tablet TAKE 1 TABLET BY MOUTH EVERY DAY 03/28/21   Carollee Herter, Alferd Apa, DO  guaiFENesin (MUCINEX) 600 MG 12 hr tablet Take 600 mg by mouth as needed.    [provider]  levothyroxine (SYNTHROID) 100 MCG tablet Take 1 tablet (100 mcg total) by mouth daily before breakfast. 03/01/21   Carollee Herter, Alferd Apa, DO  losartan (COZAAR) 25 MG tablet Take 1 tablet (25 mg total) by mouth daily. 06/02/21   [name], DO  mirabegron ER (MYRBETRIQ) 25 MG TB24 tablet Take 25 mg by mouth at bedtime.    [provider]  Multiple Vitamins-Minerals (MULTIVITAMIN ADULT) CHEW Chew 2 each by mouth daily.    [provider]  NONFORMULARY OR COMPOUNDED ITEM 4 wheeled walker   #1  dx frequent falls, weakness 06/09/21   Carollee Herter, Alferd Apa, DO  polyvinyl alcohol (LIQUIFILM TEARS) 1.4 % ophthalmic solution Place 1 drop into both eyes daily as needed for dry eyes.    [provider]  traMADol (ULTRAM) 50 MG tablet Take 0.5 tablets (25 mg total) by mouth every 6 (six) hours as needed for severe pain. 03/07/21   [name], DO                                                                                                                                    Past Surgical History Past Surgical History:  Procedure Laterality Date   COLON SURGERY     Right / Adenocarcinoma / Chemo   COLONOSCOPY  2005, 2008   Dr. Lucia Gaskins   INGUINAL HERNIA REPAIR     Right   IR ANGIO INTRA EXTRACRAN SEL COM CAROTID INNOMINATE BILAT MOD SED  12/13/2017   IR ANGIO VERTEBRAL SEL VERTEBRAL BILAT MOD SED   12/13/2017   IR PTA INTRACRANIAL  12/18/2017   RADIOLOGY WITH ANESTHESIA N/A 12/18/2017   Procedure: Angioplasty with stenting;  Surgeon: Luanne Bras, MD;  Location: [facility];  Service: Radiology;  Laterality: N/A;   Family History Family History  Problem Relation Age of Onset   Diabetes Neg Hx    Heart attack Neg Hx    Colon cancer Neg Hx    Prostate cancer Neg Hx     Social History Social History   Tobacco Use   Smoking status: Never   Smokeless tobacco: Never  Vaping Use   Vaping Use: Never used  Substance Use Topics   Alcohol use: No    Alcohol/week: 0.0 standard drinks   Drug use: No   Allergies Methotrexate derivatives, Asa [aspirin], and Penicillin g  Review of Systems Review of Systems  Neurological:  Positive for weakness.   Physical Exam Vital Signs  I have reviewed the triage vital signs BP 138/68    Pulse (!) 43    Temp 98 F (36.7 C)    Resp 15    Ht 5\' 3"  (1.6 m)    Wt 59 kg    SpO2 96%    BMI 23.04 kg/m   Physical Exam Vitals and nursing note reviewed.  Constitutional:      General: He is not in acute distress.    Appearance: He is well-developed.  HENT:     Head: Normocephalic and atraumatic.  Eyes:     Conjunctiva/sclera: Conjunctivae normal.  Cardiovascular:     Rate and Rhythm: Normal rate and regular rhythm.     Heart sounds: No murmur heard. Pulmonary:     Effort: Pulmonary effort is normal. No respiratory distress.     Breath sounds: Normal breath sounds.  Abdominal:     Palpations: Abdomen is soft.     Tenderness: There is no abdominal tenderness.  Musculoskeletal:        General: No swelling.     Cervical back: Neck supple.  Skin:    General: Skin is warm and dry.     Capillary Refill: Capillary refill takes less than 2 seconds.  Neurological:     Mental Status: He is alert.  Psychiatric:        Mood and Affect: Mood normal.    ED Results and Treatments Labs (all labs ordered are listed, but only abnormal results are  displayed) Labs Reviewed  RESP PANEL BY RT-PCR (FLU A&B, COVID) ARPGX2  COMPREHENSIVE METABOLIC PANEL  CBC WITH DIFFERENTIAL/PLATELET  TSH  URINALYSIS, ROUTINE W REFLEX MICROSCOPIC                                                                                                                          Radiology DG Hip Unilat With Pelvis 2-3 Views Left  Result Date: 06/23/2021 CLINICAL DATA:  Fall EXAM: DG HIP (WITH OR WITHOUT PELVIS) 3V LEFT COMPARISON:  None. FINDINGS: There is no evidence of hip fracture or dislocation. Prior abdominal wall hernia repair. Degenerative changes of the partially visualized lower lumbar spine. Mild degenerative changes of the bilateral hips. IMPRESSION: No acute osseous abnormality. Electronically Signed   By: Yetta Glassman M.D.   On: 06/23/2021 10:34    Pertinent  labs & imaging results that were available during my care of the patient were reviewed by me and considered in my medical decision making (see MDM for details).  Medications Ordered in ED Medications - No data to display                                                                                                                                   Procedures Procedures  (including critical care time)  Medical Decision Making / ED Course   This patient presents to the ED for concern of generalized weakness, this involves an extensive number of treatment options, and is a complaint that carries with it a high risk of complications and morbidity.  The differential diagnosis includes electrolyte abnormality, UTI, COVID-19, influenza, subdural, fracture  MDM: Patient seen in the emergency department for evaluation of generalized weakness.  Physical exam is largely unremarkable.  Laboratory evaluation is also unremarkable outside of the creatinine of 1.41 which is improved from previous evaluation and a CO2 of 19.  Flu and COVID-negative.  CTs of the head, lumbar spine unremarkable.  X-ray of the  knee with a small lucency on the fibula possibly related to an occult fracture.  A CT was obtained that does not show any evidence of fracture.  Patient weakness likely due to deconditioning and is 88 years of age.  He is currently already living at a skilled nursing facility with rehab services and he will be discharged back to his skilled nursing facility.  I worked alongside the social work team to confirm that he will have social work resources and informed his daughter of our work-up today.   Additional history obtained: -Additional history obtained from daughter -External records from outside source obtained and reviewed including: Chart review including previous notes, labs, imaging, consultation notes   Lab Tests: -I ordered, reviewed, and interpreted labs.   The pertinent results include:   Labs Reviewed  RESP PANEL BY RT-PCR (FLU A&B, COVID) ARPGX2  COMPREHENSIVE METABOLIC PANEL  CBC WITH DIFFERENTIAL/PLATELET  TSH  URINALYSIS, ROUTINE W REFLEX MICROSCOPIC      EKG   EKG Interpretation  Date/Time:  Friday June 24 2021 07:27:44 EST Ventricular Rate:  63 PR Interval:  196 QRS Duration: 93 QT Interval:  415 QTC Calculation: 425 R Axis:   2 Text Interpretation: Sinus rhythm Confirmed by Fithian ([id number]) on 06/24/2021 7:32:31 AM         Imaging Studies ordered: I ordered imaging studies including CT head, CT lumbar, CT knee, x-ray knee I independently visualized and interpreted imaging. I agree with the radiologist interpretation   Medicines ordered and prescription drug management: No orders of the defined types were placed in this encounter.   -I have reviewed the patients home medicines and have made adjustments as needed  Critical interventions none  Consultations Obtained: I requested consultation with the social work,  and discussed lab and imaging findings as  well as pertinent plan - they recommend: Discharge back to her facility as they offer  rehab services   Cardiac Monitoring: The patient was maintained on a cardiac monitor.  I personally viewed and interpreted the cardiac monitored which showed an underlying rhythm of: Normal sinus rhythm  Social Determinants of Health:  Factors impacting patients care include: Elderly, English second language   Reevaluation: After the interventions noted above, I reevaluated the patient and found that they have :stayed the same  Co morbidities that complicate the patient evaluation  Past Medical History:  Diagnosis Date   Adenocarcinoma ([place]) 1985   colon s/p right hemicolectomy and chemotherapy. F/U with cancer center and Dr. Lucia Gaskins rotinely   Depression    Eczema    Epistaxis    f/u per ENT   Erectile dysfunction    Gastric ulcer 12/11   H-Pylori Tx, EGD 3-12: gastritis   Hay fever    Head injury 11/2017   with fall   Hyperlipemia    Hypertension    Hypothyroidism    Hypothyroid   Insomnia    Osteopenia     per DEXA 07-2008 (Rx fosamax)   Rheumatoid arthritis ([place])    Stroke ([place])     " balance issue"      Dispostion: I considered admission for this patient, but the patient is already at the appropriate facility for rehab and will be better served outside the hospital.     Final Clinical Impression(s) / ED Diagnoses Final diagnoses:  None     @PCDICTATION @    Teressa Lower, MD 06/24/21 1446

## 2021-06-24 NOTE — ED Notes (Signed)
[name] daughter [phone] requesting an update on the patient

## 2021-06-24 NOTE — ED Triage Notes (Signed)
Pt arrives from [facility] with complaints of general malaise and weakness for 2 weeks. Pt was found by EMS sliding down against bed. Pt did not actually fall, but unable to hold himself up due to being so weak. All VSS. Pt Aox4. Denies, chest pain, SHOB. NAD at this time.

## 2021-06-24 NOTE — Telephone Encounter (Signed)
Orders faxed

## 2021-06-24 NOTE — ED Notes (Signed)
[name] daughter [phone] requesting an update on the patient, has called 4 times. Spoke to the nurse and stated she will call back later.

## 2021-06-27 ENCOUNTER — Telehealth: Payer: Self-pay | Admitting: Family Medicine

## 2021-06-27 DIAGNOSIS — R2689 Other abnormalities of gait and mobility: Secondary | ICD-10-CM | POA: Diagnosis not present

## 2021-06-27 DIAGNOSIS — M6281 Muscle weakness (generalized): Secondary | ICD-10-CM | POA: Diagnosis not present

## 2021-06-27 NOTE — Telephone Encounter (Signed)
opened in error

## 2021-06-28 ENCOUNTER — Ambulatory Visit (INDEPENDENT_AMBULATORY_CARE_PROVIDER_SITE_OTHER): Payer: Medicare Other

## 2021-06-28 ENCOUNTER — Inpatient Hospital Stay (HOSPITAL_COMMUNITY)
Admission: EM | Admit: 2021-06-28 | Discharge: 2021-07-07 | DRG: 065 | Disposition: A | Discharge status: Rehabilitation Facility | Payer: Medicare Other | Attending: Internal Medicine | Admitting: Internal Medicine

## 2021-06-28 ENCOUNTER — Encounter: Payer: Self-pay | Admitting: Family Medicine

## 2021-06-28 ENCOUNTER — Telehealth (INDEPENDENT_AMBULATORY_CARE_PROVIDER_SITE_OTHER): Payer: Medicare Other | Admitting: Family Medicine

## 2021-06-28 DIAGNOSIS — I129 Hypertensive chronic kidney disease with stage 1 through stage 4 chronic kidney disease, or unspecified chronic kidney disease: Secondary | ICD-10-CM | POA: Diagnosis present

## 2021-06-28 DIAGNOSIS — Z7982 Long term (current) use of aspirin: Secondary | ICD-10-CM | POA: Diagnosis not present

## 2021-06-28 DIAGNOSIS — M25551 Pain in right hip: Secondary | ICD-10-CM | POA: Diagnosis present

## 2021-06-28 DIAGNOSIS — R413 Other amnesia: Secondary | ICD-10-CM | POA: Diagnosis not present

## 2021-06-28 DIAGNOSIS — N1832 Chronic kidney disease, stage 3b: Secondary | ICD-10-CM | POA: Diagnosis not present

## 2021-06-28 DIAGNOSIS — Z9049 Acquired absence of other specified parts of digestive tract: Secondary | ICD-10-CM

## 2021-06-28 DIAGNOSIS — E039 Hypothyroidism, unspecified: Secondary | ICD-10-CM | POA: Diagnosis not present

## 2021-06-28 DIAGNOSIS — Z9221 Personal history of antineoplastic chemotherapy: Secondary | ICD-10-CM | POA: Diagnosis not present

## 2021-06-28 DIAGNOSIS — N3941 Urge incontinence: Secondary | ICD-10-CM | POA: Diagnosis present

## 2021-06-28 DIAGNOSIS — N183 Chronic kidney disease, stage 3 unspecified: Secondary | ICD-10-CM | POA: Diagnosis present

## 2021-06-28 DIAGNOSIS — Z79890 Hormone replacement therapy (postmenopausal): Secondary | ICD-10-CM | POA: Diagnosis not present

## 2021-06-28 DIAGNOSIS — I639 Cerebral infarction, unspecified: Secondary | ICD-10-CM | POA: Diagnosis present

## 2021-06-28 DIAGNOSIS — M6281 Muscle weakness (generalized): Secondary | ICD-10-CM | POA: Diagnosis not present

## 2021-06-28 DIAGNOSIS — R2689 Other abnormalities of gait and mobility: Secondary | ICD-10-CM | POA: Diagnosis not present

## 2021-06-28 DIAGNOSIS — N3281 Overactive bladder: Secondary | ICD-10-CM | POA: Diagnosis not present

## 2021-06-28 DIAGNOSIS — R531 Weakness: Secondary | ICD-10-CM

## 2021-06-28 DIAGNOSIS — I6300 Cerebral infarction due to thrombosis of unspecified precerebral artery: Secondary | ICD-10-CM | POA: Diagnosis not present

## 2021-06-28 DIAGNOSIS — F0390 Unspecified dementia without behavioral disturbance: Secondary | ICD-10-CM | POA: Diagnosis present

## 2021-06-28 DIAGNOSIS — W19XXXS Unspecified fall, sequela: Secondary | ICD-10-CM | POA: Diagnosis not present

## 2021-06-28 DIAGNOSIS — I6381 Other cerebral infarction due to occlusion or stenosis of small artery: Secondary | ICD-10-CM | POA: Diagnosis not present

## 2021-06-28 DIAGNOSIS — I10 Essential (primary) hypertension: Secondary | ICD-10-CM | POA: Diagnosis present

## 2021-06-28 DIAGNOSIS — R7303 Prediabetes: Secondary | ICD-10-CM | POA: Diagnosis not present

## 2021-06-28 DIAGNOSIS — G44221 Chronic tension-type headache, intractable: Secondary | ICD-10-CM | POA: Diagnosis not present

## 2021-06-28 DIAGNOSIS — R296 Repeated falls: Secondary | ICD-10-CM | POA: Diagnosis not present

## 2021-06-28 DIAGNOSIS — Z8673 Personal history of transient ischemic attack (TIA), and cerebral infarction without residual deficits: Secondary | ICD-10-CM

## 2021-06-28 DIAGNOSIS — W19XXXA Unspecified fall, initial encounter: Secondary | ICD-10-CM

## 2021-06-28 DIAGNOSIS — E538 Deficiency of other specified B group vitamins: Secondary | ICD-10-CM | POA: Diagnosis not present

## 2021-06-28 DIAGNOSIS — Z043 Encounter for examination and observation following other accident: Secondary | ICD-10-CM | POA: Diagnosis not present

## 2021-06-28 DIAGNOSIS — G8194 Hemiplegia, unspecified affecting left nondominant side: Secondary | ICD-10-CM | POA: Diagnosis present

## 2021-06-28 DIAGNOSIS — Z743 Need for continuous supervision: Secondary | ICD-10-CM | POA: Diagnosis not present

## 2021-06-28 DIAGNOSIS — N1831 Chronic kidney disease, stage 3a: Secondary | ICD-10-CM | POA: Diagnosis present

## 2021-06-28 DIAGNOSIS — Z85038 Personal history of other malignant neoplasm of large intestine: Secondary | ICD-10-CM

## 2021-06-28 DIAGNOSIS — I63511 Cerebral infarction due to unspecified occlusion or stenosis of right middle cerebral artery: Secondary | ICD-10-CM | POA: Diagnosis not present

## 2021-06-28 DIAGNOSIS — Z8711 Personal history of peptic ulcer disease: Secondary | ICD-10-CM | POA: Diagnosis not present

## 2021-06-28 DIAGNOSIS — R29704 NIHSS score 4: Secondary | ICD-10-CM | POA: Diagnosis present

## 2021-06-28 DIAGNOSIS — G319 Degenerative disease of nervous system, unspecified: Secondary | ICD-10-CM | POA: Diagnosis not present

## 2021-06-28 DIAGNOSIS — M25512 Pain in left shoulder: Secondary | ICD-10-CM | POA: Diagnosis present

## 2021-06-28 DIAGNOSIS — S0990XA Unspecified injury of head, initial encounter: Secondary | ICD-10-CM | POA: Diagnosis not present

## 2021-06-28 DIAGNOSIS — S199XXA Unspecified injury of neck, initial encounter: Secondary | ICD-10-CM | POA: Diagnosis not present

## 2021-06-28 DIAGNOSIS — E785 Hyperlipidemia, unspecified: Secondary | ICD-10-CM | POA: Diagnosis not present

## 2021-06-28 DIAGNOSIS — Z886 Allergy status to analgesic agent status: Secondary | ICD-10-CM | POA: Diagnosis not present

## 2021-06-28 DIAGNOSIS — K5901 Slow transit constipation: Secondary | ICD-10-CM | POA: Diagnosis not present

## 2021-06-28 DIAGNOSIS — Z888 Allergy status to other drugs, medicaments and biological substances status: Secondary | ICD-10-CM

## 2021-06-28 DIAGNOSIS — Z79899 Other long term (current) drug therapy: Secondary | ICD-10-CM

## 2021-06-28 DIAGNOSIS — I679 Cerebrovascular disease, unspecified: Secondary | ICD-10-CM | POA: Diagnosis not present

## 2021-06-28 DIAGNOSIS — Z880 Allergy status to penicillin: Secondary | ICD-10-CM

## 2021-06-28 DIAGNOSIS — M069 Rheumatoid arthritis, unspecified: Secondary | ICD-10-CM | POA: Diagnosis not present

## 2021-06-28 DIAGNOSIS — R351 Nocturia: Secondary | ICD-10-CM | POA: Diagnosis present

## 2021-06-28 DIAGNOSIS — M542 Cervicalgia: Secondary | ICD-10-CM | POA: Diagnosis not present

## 2021-06-28 DIAGNOSIS — G9389 Other specified disorders of brain: Secondary | ICD-10-CM | POA: Diagnosis not present

## 2021-06-28 DIAGNOSIS — D649 Anemia, unspecified: Secondary | ICD-10-CM | POA: Diagnosis not present

## 2021-06-28 DIAGNOSIS — I6389 Other cerebral infarction: Secondary | ICD-10-CM | POA: Diagnosis not present

## 2021-06-28 DIAGNOSIS — I6330 Cerebral infarction due to thrombosis of unspecified cerebral artery: Secondary | ICD-10-CM | POA: Diagnosis not present

## 2021-06-28 NOTE — ED Triage Notes (Signed)
Pt bib EMS from [facility] for a fall while climbing into bed. Indicates pain at L shoulder and L side of neck. Not able to speak [place] fluently, limited information from EMS. C-collar in place by EMS. Pt is not on blood thinners.  Hx stroke, vitals signs stable

## 2021-06-28 NOTE — ED Provider Notes (Signed)
Emergency Department Provider Note  I have reviewed the triage vital signs and the nursing notes.  HISTORY  Chief Complaint Fall   HPI [name] is a 88 y.o. male with who presents to the emergency department today secondary to another fall.  Patient has a history of adenocarcinoma, stroke, hypertension, hyperlipidemia and was recently in the emergency room twice on the second and third for falls as well.  In the third he had imaging and labs done that were reassuring.  He was discharged back to his facility.  It sounds like there is some concern for him needing skilled nursing and in reviewing epic notes it appears that his primary doctor is try to get him into skilled nursing right now as he is in independent living.  Not sure the status of this his daughter is on the way and I will talk to about it when she gets here.  Apparently today he was either getting into getting out of bed and he fell hurting his left side.  He was complaining of left head, shoulder, arm and right hip pain.  No loss of consciousness.  No blood thinners.  Generalized weakness but nothing focal that is new.  On daughter's arrival she states that she has noticed that he has new left-sided deficits from about a week ago.  She states this is was causing him to fall.  PMH Past Medical History:  Diagnosis Date   Adenocarcinoma ([place]) 1985   colon s/p right hemicolectomy and chemotherapy. F/U with cancer center and Dr. Lucia Gaskins rotinely   Depression    Eczema    Epistaxis    f/u per ENT   Erectile dysfunction    Gastric ulcer 12/11   H-Pylori Tx, EGD 3-12: gastritis   Hay fever    Head injury 11/2017   with fall   Hyperlipemia    Hypertension    Hypothyroidism    Hypothyroid   Insomnia    Osteopenia     per DEXA 07-2008 (Rx fosamax)   Rheumatoid arthritis ([place])    Stroke ([place])     " balance issue"    Home Medications Prior to Admission medications   Medication Sig Start Date End Date Taking?  Authorizing Provider  aspirin EC 81 MG tablet Take 81 mg by mouth daily. Swallow whole.   Yes [provider]  docusate sodium (COLACE) 100 MG capsule Take 200 mg by mouth daily.   Yes [provider]  furosemide (LASIX) 20 MG tablet Take 1 tablet (20 mg total) by mouth daily. Patient taking differently: Take 20 mg by mouth daily as needed for fluid or edema. 05/13/20  Yes [name] R, DO  gemfibrozil (LOPID) 600 MG tablet TAKE 1 TABLET BY MOUTH EVERY DAY Patient taking differently: Take 600 mg by mouth every evening. 03/28/21  Yes [name], DO  guaiFENesin (MUCINEX) 600 MG 12 hr tablet Take 600 mg by mouth 2 (two) times daily as needed for cough or to loosen phlegm.   Yes [provider]  levothyroxine (SYNTHROID) 100 MCG tablet Take 1 tablet (100 mcg total) by mouth daily before breakfast. 03/01/21  Yes Carollee Herter, Yvonne R, DO  loratadine (CLARITIN) 10 MG tablet Take 10 mg by mouth daily.   Yes [provider]  mirabegron ER (MYRBETRIQ) 25 MG TB24 tablet Take 25 mg by mouth at bedtime.   Yes [provider]  Multiple Vitamins-Minerals (MULTIVITAMIN ADULT) CHEW Chew 2 each by mouth daily.  Yes [provider]  NONFORMULARY OR COMPOUNDED ITEM 4 wheeled walker   #1  dx frequent falls, weakness 06/09/21  Yes [name] R, DO  polyvinyl alcohol (LIQUIFILM TEARS) 1.4 % ophthalmic solution Place 1 drop into both eyes daily as needed for dry eyes.   Yes [provider]  traMADol (ULTRAM) 50 MG tablet Take 0.5 tablets (25 mg total) by mouth every 6 (six) hours as needed for severe pain. 03/07/21  Yes [name] R, DO  fluticasone (FLONASE) 50 MCG/ACT nasal spray Place 2 sprays into both nostrils daily. Patient not taking: Reported on 06/29/2021 06/09/21   Carollee Herter, Alferd Apa, DO  losartan (COZAAR) 25 MG tablet Take 1 tablet (25 mg total) by mouth daily. Patient not taking: Reported on 06/29/2021 06/02/21    [name], DO    Social History Social History   Tobacco Use   Smoking status: Never   Smokeless tobacco: Never  Vaping Use   Vaping Use: Never used  Substance Use Topics   Alcohol use: No    Alcohol/week: 0.0 standard drinks   Drug use: No    Review of Systems: Documented in HPI ____________________________________________  PHYSICAL EXAM: VITAL SIGNS: Vitals:   06/28/21 2346 06/29/21 0211 06/29/21 0447  BP: (!) 170/68 (!) 136/58 (!) 137/56  Pulse: 72 68 (!) 55  Resp: 17 16 17   Temp: (!) 97.5 F (36.4 C)  97.6 F (36.4 C)  TempSrc: Oral  Oral  SpO2: 97% 94% 100%    Physical Exam Vitals and nursing note reviewed.  Constitutional:      Appearance: He is well-developed.  HENT:     Head: Normocephalic and atraumatic.     Mouth/Throat:     Mouth: Mucous membranes are moist.     Pharynx: Oropharynx is clear.  Eyes:     Pupils: Pupils are equal, round, and reactive to light.  Cardiovascular:     Rate and Rhythm: Normal rate.  Pulmonary:     Effort: Pulmonary effort is normal. No respiratory distress.  Abdominal:     General: Abdomen is flat. There is no distension.  Musculoskeletal:        General: Normal range of motion.     Cervical back: Normal range of motion.  Skin:    General: Skin is warm and dry.  Neurological:     General: No focal deficit present.     Mental Status: He is alert.     Motor: Weakness (Left grip and left leg lift) present.      ____________________________________________   LABS (all labs ordered are listed, but only abnormal results are displayed)  Labs Reviewed  CBC WITH DIFFERENTIAL/PLATELET - Abnormal; Notable for the following components:      Result Value   RBC 4.00 (*)    Hemoglobin 12.1 (*)    HCT 36.7 (*)    All other components within normal limits  COMPREHENSIVE METABOLIC PANEL - Abnormal; Notable for the following components:   Glucose, Bld 123 (*)    Creatinine, Ser 1.59 (*)    Albumin 3.2 (*)     Total Bilirubin 0.2 (*)    GFR, Estimated 41 (*)    All other components within normal limits  URINALYSIS, ROUTINE W REFLEX MICROSCOPIC  TSH   ____________________________________________  EKG   EKG Interpretation  Date/Time:  Tuesday June 28 2021 23:53:50 EST Ventricular Rate:  62 PR Interval:  192 QRS Duration: 82 QT Interval:  392 QTC Calculation: 397  R Axis:   -4 Text Interpretation: Normal sinus rhythm Normal ECG When compared with ECG of 24-Jun-2021 07:27, PREVIOUS ECG IS PRESENT Confirmed by Merrily Pew [phone]) on 06/29/2021 7:40:32 AM         ____________________________________________  RADIOLOGY  DG Chest 1 View  Result Date: 06/29/2021 CLINICAL DATA:  Fall EXAM: CHEST  1 VIEW COMPARISON:  06/09/2021 FINDINGS: Heart and mediastinal contours are within normal limits. No focal opacities or effusions. No acute bony abnormality. Aortic atherosclerosis. IMPRESSION: No active cardiopulmonary disease. Electronically Signed   By: Rolm Baptise M.D.   On: 06/29/2021 01:06   CT Head Wo Contrast  Result Date: 06/29/2021 CLINICAL DATA:  Head trauma, minor (Age >= 65y).  Fall. EXAM: CT HEAD WITHOUT CONTRAST TECHNIQUE: Contiguous axial images were obtained from the base of the skull through the vertex without intravenous contrast. RADIATION DOSE REDUCTION: This exam was performed according to the departmental dose-optimization program which includes automated exposure control, adjustment of the mA and/or kV according to patient size and/or use of iterative reconstruction technique. COMPARISON:  06/24/2021 FINDINGS: Brain: There is atrophy and chronic small vessel disease changes. No acute intracranial abnormality. Specifically, no hemorrhage, hydrocephalus, mass lesion, acute infarction, or significant intracranial injury. Multiple bilateral basal ganglia and corona radiata lacunar infarcts. Old bilateral cerebellar infarcts. Vascular: No hyperdense vessel or unexpected  calcification. Skull: No acute calvarial abnormality. Sinuses/Orbits: No acute findings Other: None IMPRESSION: No acute intracranial abnormality. Electronically Signed   By: Rolm Baptise M.D.   On: 06/29/2021 02:11   CT Cervical Spine Wo Contrast  Result Date: 06/29/2021 CLINICAL DATA:  Neck trauma (Age >= 65y).  Fall. EXAM: CT CERVICAL SPINE WITHOUT CONTRAST TECHNIQUE: Multidetector CT imaging of the cervical spine was performed without intravenous contrast. Multiplanar CT image reconstructions were also generated. RADIATION DOSE REDUCTION: This exam was performed according to the departmental dose-optimization program which includes automated exposure control, adjustment of the mA and/or kV according to patient size and/or use of iterative reconstruction technique. COMPARISON:  03/09/2019 FINDINGS: Alignment: No subluxation. Skull base and vertebrae: No acute fracture. No primary bone lesion or focal pathologic process. Soft tissues and spinal canal: No prevertebral fluid or swelling. No visible canal hematoma. Disc levels: Diffuse advanced degenerative disc disease and facet disease. Upper chest: No acute findings Other: None IMPRESSION: Advanced diffuse degenerative disc and facet disease. No acute bony abnormality. Electronically Signed   By: Rolm Baptise M.D.   On: 06/29/2021 02:13   MR ANGIO HEAD WO CONTRAST  Result Date: 06/29/2021 CLINICAL DATA:  Neuro deficit with acute stroke suspected EXAM: MRI HEAD WITHOUT CONTRAST MRA HEAD WITHOUT CONTRAST MRA OF THE NECK WITHOUT AND WITH CONTRAST TECHNIQUE: Multiplanar, multi-echo pulse sequences of the brain and surrounding structures were acquired without intravenous contrast. Angiographic images of the Circle of Willis were acquired using MRA technique without intravenous contrast. Angiographic images of the neck were acquired using MRA technique without and with intravenous contrast. Carotid stenosis measurements (when applicable) are obtained utilizing  NASCET criteria, using the distal internal carotid diameter as the denominator. CONTRAST:  Reference EMR. COMPARISON:  Head CT from earlier today. CTA of the head neck 07/09/2018 FINDINGS: MR HEAD FINDINGS Brain: Band of restricted diffusion at the right corona radiata. Background of chronic small vessel ischemia with confluent gliosis in the periventricular white matter. Scattered chronic lacunes especially in the left frontal region. Small remote bilateral cerebellar infarcts which are numerous. Generalized brain atrophy. No acute hemorrhage, hydrocephalus, or masslike finding. Chronic right thalamic  lacunar infarct. Vascular: Normal flow voids. Skull and upper cervical spine: Normal marrow signal Sinuses/Orbits: Bilateral cataract resection. MRA HEAD FINDINGS Anterior circulation: Atheromatous irregularity of intracranial vessels. High-grade right M1 segment stenosis just before the bifurcation. Moderate narrowing at the right A1 origin. The right A1 segment is non dominant. Posterior circulation: No flow seen in the covered right V4 segment. Extensive atheromatous irregularity of the left vertebral artery, so diffusely diseased that accurate measurement of stenosis is complicated. There is an at least 60% narrowing at the distal V4 segment when compared with the more normal diameter proximal V4 segment. Extensive atheromatous irregularity of the basilar. The degree of vertebral and basilar stenosis actually appears improved from prior CTA, especially at the distal V4 segment. Rightward outpouching of the mid basilar is likely atheromatous. No suspected aneurysm. Fetal type left PCA. Symmetric flow in the atheromatous posterior cerebral arteries. Anatomic variants:As above MRA NECK FINDINGS Aortic arch: Normal diameter with 3 vessel branching. Right carotid system: Vessels are smooth and diffusely patent. Mild plaque like changes at the bifurcation. Left carotid system: Vessels are smooth and diffusely patent.  Vertebral arteries: No proximal subclavian stenosis. Non dominant right vertebral artery with flow seen until the V3 segment. Chronic distal right vertebral occlusion by prior CTA. Left vertebral origin stenosis of approximately 50%, non progressed from 2020. Other: None. IMPRESSION: Brain MRI: 1. Small acute perforator infarct at the right corona radiata. 2. Extensive chronic small vessel ischemia. Chronic small vessel infarcts in the cerebellum and subcortical brain. 3. Brain atrophy. Intracranial MRA: 1. Advanced and diffuse atherosclerosis. 2. Advanced right M1 segment stenosis that is new from a 2020 CTA. 3. 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 Chronic high-grade narrowing in the left V4 segment and basilar, non progressed from 2020. Neck MRA: 1. 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 2. 50% narrowing at the left vertebral origin, chronic when compared to 2020 CTA. 3. No stenosis of the cervical common and internal carotids. Electronically Signed   By: Jorje Guild M.D.   On: 06/29/2021 07:16   MR Angiogram Neck W or Wo Contrast  Result Date: 06/29/2021 CLINICAL DATA:  Neuro deficit with acute stroke suspected EXAM: MRI HEAD WITHOUT CONTRAST MRA HEAD WITHOUT CONTRAST MRA OF THE NECK WITHOUT AND WITH CONTRAST TECHNIQUE: Multiplanar, multi-echo pulse sequences of the brain and surrounding structures were acquired without intravenous contrast. Angiographic images of the Circle of Willis were acquired using MRA technique without intravenous contrast. Angiographic images of the neck were acquired using MRA technique without and with intravenous contrast. Carotid stenosis measurements (when applicable) are obtained utilizing NASCET criteria, using the distal internal carotid diameter as the denominator. CONTRAST:  Reference EMR. COMPARISON:  Head CT from earlier today. CTA of the head neck 07/09/2018 FINDINGS: MR HEAD FINDINGS Brain: Band of restricted diffusion at the right corona  radiata. Background of chronic small vessel ischemia with confluent gliosis in the periventricular white matter. Scattered chronic lacunes especially in the left frontal region. Small remote bilateral cerebellar infarcts which are numerous. Generalized brain atrophy. No acute hemorrhage, hydrocephalus, or masslike finding. Chronic right thalamic lacunar infarct. Vascular: Normal flow voids. Skull and upper cervical spine: Normal marrow signal Sinuses/Orbits: Bilateral cataract resection. MRA HEAD FINDINGS Anterior circulation: Atheromatous irregularity of intracranial vessels. High-grade right M1 segment stenosis just before the bifurcation. Moderate narrowing at the right A1 origin. The right A1 segment is non dominant. Posterior circulation: No flow seen in the covered right V4 segment. Extensive atheromatous irregularity of the  left vertebral artery, so diffusely diseased that accurate measurement of stenosis is complicated. There is an at least 60% narrowing at the distal V4 segment when compared with the more normal diameter proximal V4 segment. Extensive atheromatous irregularity of the basilar. The degree of vertebral and basilar stenosis actually appears improved from prior CTA, especially at the distal V4 segment. Rightward outpouching of the mid basilar is likely atheromatous. No suspected aneurysm. Fetal type left PCA. Symmetric flow in the atheromatous posterior cerebral arteries. Anatomic variants:As above MRA NECK FINDINGS Aortic arch: Normal diameter with 3 vessel branching. Right carotid system: Vessels are smooth and diffusely patent. Mild plaque like changes at the bifurcation. Left carotid system: Vessels are smooth and diffusely patent. Vertebral arteries: No proximal subclavian stenosis. Non dominant right vertebral artery with flow seen until the V3 segment. Chronic distal right vertebral occlusion by prior CTA. Left vertebral origin stenosis of approximately 50%, non progressed from 2020.  Other: None. IMPRESSION: Brain MRI: 1. Small acute perforator infarct at the right corona radiata. 2. Extensive chronic small vessel ischemia. Chronic small vessel infarcts in the cerebellum and subcortical brain. 3. Brain atrophy. Intracranial MRA: 1. Advanced and diffuse atherosclerosis. 2. Advanced right M1 segment stenosis that is new from a 2020 CTA. 3. 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 Chronic high-grade narrowing in the left V4 segment and basilar, non progressed from 2020. Neck MRA: 1. 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 2. 50% narrowing at the left vertebral origin, chronic when compared to 2020 CTA. 3. No stenosis of the cervical common and internal carotids. Electronically Signed   By: Jorje Guild M.D.   On: 06/29/2021 07:16   MR BRAIN WO CONTRAST  Result Date: 06/29/2021 CLINICAL DATA:  Neuro deficit with acute stroke suspected EXAM: MRI HEAD WITHOUT CONTRAST MRA HEAD WITHOUT CONTRAST MRA OF THE NECK WITHOUT AND WITH CONTRAST TECHNIQUE: Multiplanar, multi-echo pulse sequences of the brain and surrounding structures were acquired without intravenous contrast. Angiographic images of the Circle of Willis were acquired using MRA technique without intravenous contrast. Angiographic images of the neck were acquired using MRA technique without and with intravenous contrast. Carotid stenosis measurements (when applicable) are obtained utilizing NASCET criteria, using the distal internal carotid diameter as the denominator. CONTRAST:  Reference EMR. COMPARISON:  Head CT from earlier today. CTA of the head neck 07/09/2018 FINDINGS: MR HEAD FINDINGS Brain: Band of restricted diffusion at the right corona radiata. Background of chronic small vessel ischemia with confluent gliosis in the periventricular white matter. Scattered chronic lacunes especially in the left frontal region. Small remote bilateral cerebellar infarcts which are numerous. Generalized brain atrophy. No acute  hemorrhage, hydrocephalus, or masslike finding. Chronic right thalamic lacunar infarct. Vascular: Normal flow voids. Skull and upper cervical spine: Normal marrow signal Sinuses/Orbits: Bilateral cataract resection. MRA HEAD FINDINGS Anterior circulation: Atheromatous irregularity of intracranial vessels. High-grade right M1 segment stenosis just before the bifurcation. Moderate narrowing at the right A1 origin. The right A1 segment is non dominant. Posterior circulation: No flow seen in the covered right V4 segment. Extensive atheromatous irregularity of the left vertebral artery, so diffusely diseased that accurate measurement of stenosis is complicated. There is an at least 60% narrowing at the distal V4 segment when compared with the more normal diameter proximal V4 segment. Extensive atheromatous irregularity of the basilar. The degree of vertebral and basilar stenosis actually appears improved from prior CTA, especially at the distal V4 segment. Rightward outpouching of the mid basilar is likely atheromatous. No  suspected aneurysm. Fetal type left PCA. Symmetric flow in the atheromatous posterior cerebral arteries. Anatomic variants:As above MRA NECK FINDINGS Aortic arch: Normal diameter with 3 vessel branching. Right carotid system: Vessels are smooth and diffusely patent. Mild plaque like changes at the bifurcation. Left carotid system: Vessels are smooth and diffusely patent. Vertebral arteries: No proximal subclavian stenosis. Non dominant right vertebral artery with flow seen until the V3 segment. Chronic distal right vertebral occlusion by prior CTA. Left vertebral origin stenosis of approximately 50%, non progressed from 2020. Other: None. IMPRESSION: Brain MRI: 1. Small acute perforator infarct at the right corona radiata. 2. Extensive chronic small vessel ischemia. Chronic small vessel infarcts in the cerebellum and subcortical brain. 3. Brain atrophy. Intracranial MRA: 1. Advanced and diffuse  atherosclerosis. 2. Advanced right M1 segment stenosis that is new from a 2020 CTA. 3. 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 Chronic high-grade narrowing in the left V4 segment and basilar, non progressed from 2020. Neck MRA: 1. 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 2. 50% narrowing at the left vertebral origin, chronic when compared to 2020 CTA. 3. No stenosis of the cervical common and internal carotids. Electronically Signed   By: Jorje Guild M.D.   On: 06/29/2021 07:16   DG Shoulder Left  Result Date: 06/29/2021 CLINICAL DATA:  Fall EXAM: LEFT SHOULDER - 2+ VIEW COMPARISON:  None. FINDINGS: Degenerative changes in the [facility] joint with joint space narrowing and spurring. Glenohumeral joint is maintained. No acute bony abnormality. Specifically, no fracture, subluxation, or dislocation. Soft tissues are intact. IMPRESSION: Degenerative changes in the left AC joint. No acute bony abnormality. Electronically Signed   By: Rolm Baptise M.D.   On: 06/29/2021 00:57   DG Humerus Left  Result Date: 06/29/2021 CLINICAL DATA:  Fall EXAM: LEFT HUMERUS - 2+ VIEW COMPARISON:  None. FINDINGS: There is no evidence of fracture or other focal bone lesions. Soft tissues are unremarkable. Degenerative changes in the left AC joint. IMPRESSION: No acute bony abnormality. Electronically Signed   By: Rolm Baptise M.D.   On: 06/29/2021 00:58   DG Hip Unilat W or Wo Pelvis 2-3 Views Right  Result Date: 06/29/2021 CLINICAL DATA:  Fall EXAM: DG HIP (WITH OR WITHOUT PELVIS) 2-3V RIGHT COMPARISON:  None. FINDINGS: Hip joints and SI joints are symmetric. No acute bony abnormality. Specifically, no fracture, subluxation, or dislocation. IMPRESSION: No acute bony abnormality. Electronically Signed   By: Rolm Baptise M.D.   On: 06/29/2021 00:58   ____________________________________________  PROCEDURES  Procedure(s) performed:   Procedures ____________________________________________  INITIAL IMPRESSION  / ASSESSMENT AND PLAN   This patient presents to the ED for concern of weakness and falls, this involves an extensive number of treatment options, and is a complaint that carries with it a high risk of complications and morbidity.  The differential diagnosis includes stroke, stroke reactivation.   Additional history obtained:  Additional history obtained from daughter Previous records obtained and reviewed in epic  Co morbidities that complicate the patient evaluation  Age, h/o stroke  Social Determinants of Health:  Lives in independent living.   ED Course  Images ordered viewed and obtained by myself. Agree with Radiology interpretation. Details in ED course.  Labs ordered reviewed by myself as detailed in ED course.  Consultations obtained/considered detailed in ED course.        Cardiac Monitoring:  The patient was maintained on a cardiac monitor.  I personally viewed and interpreted the cardiac monitored which showed  an underlying rhythm of: n/a  CRITICAL INTERVENTIONS:  N/a  Reevaluation:  After the interventions noted above, I reevaluated the patient and found that they have :stayed the same  FINAL IMPRESSION AND PLAN Final diagnoses:  Fall, initial encounter     Medical screening exam was performed and I feel the patient has had appropriate emergency department evaluation and work-up for their chief complaint and is stable for ADMISSION to the hospitalist time.  I discussed with Dr. Tamala Julian with the [facility] service and discussed labs, imaging and other work-up in the emergency room.  They agree to admission for further management and work-up of said condition, request neurology consult.     ____________________________________________   NEW OUTPATIENT MEDICATIONS STARTED DURING THIS VISIT:  New Prescriptions   No medications on file    Note:  This note was prepared with assistance of Dragon voice recognition software. Occasional wrong-word or sound-a-like  substitutions may have occurred due to the inherent limitations of voice recognition software.    Tymara Saur, Corene Cornea, MD 06/29/21 [phone]

## 2021-06-28 NOTE — Chronic Care Management (AMB) (Signed)
Chronic Care Management   CCM RN Visit Note  06/28/2021 Name: [name] MRN: [id number] DOB: 11-21-1928  Subjective: [name] is a 88 y.o. year old male who is a primary care patient of [name], DO. The care management team was consulted for assistance with disease management and care coordination needs.    Care Coordination/collaboration  for  Level of Care Concerns. Per chart: ED visit on 06/23/21 and 06/24/21 for fall/generalized weakness. Video visit completed today with PCP; FL-2 form completion per PCP. Plans to transition from independent living facility to skilled facility for rehab. RNCM request Social worker assistance. And request care guide schedule follow up telephone call with RNCM. Referral to embedded LCSW for  assistance.     Consent to Services:  The patient was given information about Chronic Care Management services, agreed to services, and gave verbal consent prior to initiation of services.  Please see initial visit note for detailed documentation.   Patient agreed to services and verbal consent obtained.   Assessment: Review of patient past medical history, allergies, medications, health status, including review of consultants reports, laboratory and other test data, was performed as part of comprehensive evaluation and provision of chronic care management services.   SDOH (Social Determinants of Health) assessments and interventions performed:    CCM Care Plan  Allergies  Allergen Reactions   Methotrexate Derivatives Other (See Comments)    Mouth sores / pancytopenia   Asa [Aspirin] Other (See Comments)    Gastric symptoms - can tolerate 81 mg aspirin   Penicillin G Rash    Did it involve swelling of the face/tongue/throat, SOB, or low BP? Unknown Did it involve sudden or severe rash/hives, skin peeling, or any reaction on the inside of your mouth or nose? Unknown Did you need to seek medical attention at a hospital or doctor's office? Unknown When  did it last happen?   unknown    If all above answers are NO, may proceed with cephalosporin use.    Outpatient Encounter Medications as of 06/28/2021  Medication Sig Note   aspirin EC 81 MG tablet Take 81 mg by mouth daily with lunch. Swallow whole.    CALCIUM PO Take 1 tablet by mouth every other day.    Cholecalciferol (VITAMIN D3 PO) Take 1 tablet by mouth daily with lunch.    docusate sodium (COLACE) 100 MG capsule Take 200 mg by mouth daily.    fexofenadine (ALLEGRA) 180 MG tablet Take 180 mg by mouth daily.    fluticasone (FLONASE) 50 MCG/ACT nasal spray Place 2 sprays into both nostrils daily.    furosemide (LASIX) 20 MG tablet Take 1 tablet (20 mg total) by mouth daily. (Patient taking differently: Take 20 mg by mouth daily as needed for fluid or edema.)    gemfibrozil (LOPID) 600 MG tablet TAKE 1 TABLET BY MOUTH EVERY DAY    guaiFENesin (MUCINEX) 600 MG 12 hr tablet Take 600 mg by mouth as needed.    levothyroxine (SYNTHROID) 100 MCG tablet Take 1 tablet (100 mcg total) by mouth daily before breakfast.    losartan (COZAAR) 25 MG tablet Take 1 tablet (25 mg total) by mouth daily. 06/05/2021: Dose lowered 04/19/2021 by Dr Zenda Alpers - nephrologist   mirabegron ER (MYRBETRIQ) 25 MG TB24 tablet Take 25 mg by mouth at bedtime.    Multiple Vitamins-Minerals (MULTIVITAMIN ADULT) CHEW Chew 2 each by mouth daily.    NONFORMULARY OR COMPOUNDED ITEM 4 wheeled walker   #1  dx  frequent falls, weakness    polyvinyl alcohol (LIQUIFILM TEARS) 1.4 % ophthalmic solution Place 1 drop into both eyes daily as needed for dry eyes.    traMADol (ULTRAM) 50 MG tablet Take 0.5 tablets (25 mg total) by mouth every 6 (six) hours as needed for severe pain.    No facility-administered encounter medications on file as of 06/28/2021.    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   Chronic kidney disease, stage 3a ([place]) 08/07/2020   Leukopenia 08/07/2020   Acute respiratory failure with hypoxia ([place]) 08/07/2020   SIRS (systemic inflammatory response syndrome) ([place]) 08/07/2020   Methotrexate adverse reaction, initial encounter    Acute pain of right shoulder 04/14/2020   Lower extremity edema 11/13/2019   Head trauma 11/13/2019   Dyslipidemia 11/13/2019   Memory loss 07/28/2019   Chronic right shoulder pain 07/28/2019   Frequent falls 07/28/2019   Balance problem 07/28/2019   Dysphagia 07/28/2019   Traumatic hemorrhage of cerebrum, unspecified, without loss of consciousness, initial encounter ([place])    Basilar artery stenosis with infarction ([place]) 12/18/2017   TIA (transient ischemic attack) 12/10/2017   Rheumatoid arthritis ([place])    Chest pain 06/02/2011   General medical examination 04/11/2011   PEPTIC ULCER DISEASE, HELICOBACTER PYLORI POSITIVE 07/12/2010   PERSONAL HISTORY MALIG NEOPLASM LARGE INTESTINE 05/18/2010   MITRAL VALVE DISORDERS 04/05/2009   HOARSENESS 12/04/2008   OSTEOPENIA 08/26/2008   HYPERTRIGLYCERIDEMIA 07/23/2008   EPISTAXIS 07/26/2007   ERECTILE DYSFUNCTION 02/22/2007   ECZEMA 12/07/2006   LEG CRAMPS 10/09/2006   Hypothyroidism 08/28/2006   Essential hypertension 08/28/2006   HAY FEVER 08/28/2006   INSOMNIA 08/28/2006    Conditions to be addressed/monitored: Level of care needs  Care Plan : RN Care Manager Plan of Care  Updates made by Luretha Rued, RN since 06/28/2021 12:00 AM     Problem: No Plan of Care Established for Mangment of Chronic Dsiease (HTN, Hypothyroid)   Priority: High     Long-Range Goal: Developement of plan of care for Chronic Disease Managment (HTN, Hypothryroid)   Start Date: 03/29/2021  Expected End Date: 07/28/2021  Priority: High  Note:   Current Barriers:  Knowledge Deficits related to plan of care for  management of HTN and Hypothyroidism  Care Coordination needs related to Level of care concerns Language Barrier-vietnamese speaking  RNCM Clinical Goal(s):  Patient will verbalize understanding of plan for management of HTN and Hypothyroidism attend all scheduled medical appointments: . work with pharmacist to address medication adherance related toHTN and Hypothyroidism through collaboration with RN Care manager, provider, and care team.   Interventions: 1:1 collaboration with primary care provider regarding development and update of comprehensive plan of care as evidenced by provider attestation and co-signature Inter-disciplinary care team collaboration (see longitudinal plan of care) Provide support to patient/family he transitions to [facility]  Hypertension Interventions: Goal On Track: Yes. Long Term Last practice recorded BP readings:  BP Readings from Last 3 Encounters:  03/01/21 106/60  09/07/20 128/60  08/13/20 (!) 144/64  Most recent eGFR/CrCl:  Lab Results  Component Value Date   EGFR 55 (L) 08/06/2020    No components found for: CRCL  Evaluation of current treatment plan related to hypertension self management and patient's adherence to plan as established by  provider Discussed medication management with transition to [facility] Encouraged to continue to attend provider visits as scheduled  Hypothroidism Interventions Goal on track:  Yes. Long Term Evaluation of current treatment plan related to HTN and Hypothyroidism, Transportation self-management and patient's adherence to plan as established by provider. Discussed plans with patient for ongoing care management follow up and provided patient with direct contact information for care management team Discuss plan of care for medication management as [place] Encouraged to continue to work with clinical pharmacist for [employer].  Falls  Interventions:  06/28/21 Update: ED visit 06/23/21 fall and 06/24/21 weakness. Per chart attempting to get into SNF for rehabilitation-FL2 completion per PCP. Referral for clinical social worker for assistance with the process.     Patient Goals/Self-Care Activities: Take all medications as prescribed Attend all scheduled provider appointments Call provider office for new concerns or questions  Continue fall precaution strategies: change positions slowly, keep walkway clear, provide good lighting, use assistive device as recommended, remain physically active to maintain muscle strength and tone Continue to work with Care Management Team for disease management and/or care coordination needs Call RN Case Manager for continued disease management and care coordination services as needed   Plan: Referral to LCSW. Care Management team to call to schedule patient/ daughter for follow up call.  Thea Silversmith, RN, MSN, BSN, CCM Care Management Coordinator [facility] [phone]

## 2021-06-28 NOTE — Progress Notes (Addendum)
Chief Complaint  Patient presents with   complete FL2    Subjective: Patient is a 88 y.o. male here for FL-2 form completion. Due to COVID-19 pandemic, we are interacting via web portal for an electronic face-to-face visit. I verified patient's ID using 2 identifiers w his daughter. Patient's daughter agreed to proceed with visit via this method. Patient is at independent living facility, I am at office. Patient, spouse, residential PTA, his daughter and I are present for visit.   The patient currently resides in an independent living facility.  He had a stroke 3 years ago and requires basic therapy and assistance.  Last week he sustained a fall.  On 2/3, he went to the emergency department revealing a largely negative CT head.  The physician and the family reportedly had a miscommunication where he thought the patient had access to more intensive rehab opportunities and did not decide for admission or put an in order for a skilled nursing facility.  The patient is unable to do anything by himself at this time.  This is a change from his baseline.  The patient's family is hoping to get him in with a skilled nursing facility to help with his recovery process.  Past Medical History:  Diagnosis Date   Adenocarcinoma ([place]) 1985   colon s/p right hemicolectomy and chemotherapy. F/U with cancer center and Dr. Lucia Gaskins rotinely   Depression    Eczema    Epistaxis    f/u per ENT   Erectile dysfunction    Gastric ulcer 12/11   H-Pylori Tx, EGD 3-12: gastritis   Hay fever    Head injury 11/2017   with fall   Hyperlipemia    Hypertension    Hypothyroidism    Hypothyroid   Insomnia    Osteopenia     per DEXA 07-2008 (Rx fosamax)   Rheumatoid arthritis ([place])    Stroke ([place])     " balance issue"    Objective: No conversational dyspnea Age appropriate judgment and insight Nml affect and mood  Assessment and Plan: Weakness generalized  Fall, initial encounter  This is a challenging case  logistically. Likely had another stroke not picked up by CT given s/s's.  His daughter mentioned inpatient rehab several times which I do not think I can get him into since he is not admitted.  I will fill out the [facility] 2 form today with the hopes of getting him in with a skilled nursing facility.  I will have our team reach out to his daughter with some options.  I did speak with Lelon Frohlich, the occupational Chief Technology Officer of [facility] where he is residing.  She confirmed that the emergency department physician was interested in skilled nursing which they misunderstood to be available for at [facility].  We will request SNF with the FL 2 form.  I did follow-up and discussed this with the daughter. The patient voiced understanding and agreement to the plan.  I spent 60 minutes with the patient discussing the above plan with his daughter, contacting other providers, reviewing his chart, and filling out paperwork on the same day of the visit.  [place], DO 06/28/21  12:50 PM

## 2021-06-28 NOTE — Addendum Note (Signed)
Addended by: Ames Coupe on: 06/28/2021 12:51 PM   Modules accepted: Orders

## 2021-06-29 ENCOUNTER — Inpatient Hospital Stay (HOSPITAL_COMMUNITY): Payer: Medicare Other

## 2021-06-29 ENCOUNTER — Emergency Department (HOSPITAL_COMMUNITY): Payer: Medicare Other

## 2021-06-29 DIAGNOSIS — E785 Hyperlipidemia, unspecified: Secondary | ICD-10-CM | POA: Diagnosis not present

## 2021-06-29 DIAGNOSIS — N1832 Chronic kidney disease, stage 3b: Secondary | ICD-10-CM

## 2021-06-29 DIAGNOSIS — K5901 Slow transit constipation: Secondary | ICD-10-CM | POA: Diagnosis not present

## 2021-06-29 DIAGNOSIS — Z880 Allergy status to penicillin: Secondary | ICD-10-CM | POA: Diagnosis not present

## 2021-06-29 DIAGNOSIS — E538 Deficiency of other specified B group vitamins: Secondary | ICD-10-CM | POA: Diagnosis present

## 2021-06-29 DIAGNOSIS — Z7982 Long term (current) use of aspirin: Secondary | ICD-10-CM | POA: Diagnosis not present

## 2021-06-29 DIAGNOSIS — I69354 Hemiplegia and hemiparesis following cerebral infarction affecting left non-dominant side: Secondary | ICD-10-CM | POA: Diagnosis not present

## 2021-06-29 DIAGNOSIS — R29704 NIHSS score 4: Secondary | ICD-10-CM | POA: Diagnosis present

## 2021-06-29 DIAGNOSIS — R7303 Prediabetes: Secondary | ICD-10-CM | POA: Diagnosis present

## 2021-06-29 DIAGNOSIS — M069 Rheumatoid arthritis, unspecified: Secondary | ICD-10-CM | POA: Diagnosis present

## 2021-06-29 DIAGNOSIS — R296 Repeated falls: Secondary | ICD-10-CM | POA: Diagnosis not present

## 2021-06-29 DIAGNOSIS — Z8711 Personal history of peptic ulcer disease: Secondary | ICD-10-CM | POA: Diagnosis not present

## 2021-06-29 DIAGNOSIS — I6389 Other cerebral infarction: Secondary | ICD-10-CM | POA: Diagnosis not present

## 2021-06-29 DIAGNOSIS — I6381 Other cerebral infarction due to occlusion or stenosis of small artery: Secondary | ICD-10-CM | POA: Diagnosis present

## 2021-06-29 DIAGNOSIS — I639 Cerebral infarction, unspecified: Secondary | ICD-10-CM | POA: Diagnosis present

## 2021-06-29 DIAGNOSIS — N3281 Overactive bladder: Secondary | ICD-10-CM | POA: Diagnosis present

## 2021-06-29 DIAGNOSIS — I679 Cerebrovascular disease, unspecified: Secondary | ICD-10-CM | POA: Diagnosis present

## 2021-06-29 DIAGNOSIS — I10 Essential (primary) hypertension: Secondary | ICD-10-CM

## 2021-06-29 DIAGNOSIS — G8194 Hemiplegia, unspecified affecting left nondominant side: Secondary | ICD-10-CM | POA: Diagnosis present

## 2021-06-29 DIAGNOSIS — W19XXXS Unspecified fall, sequela: Secondary | ICD-10-CM | POA: Diagnosis not present

## 2021-06-29 DIAGNOSIS — Z85038 Personal history of other malignant neoplasm of large intestine: Secondary | ICD-10-CM | POA: Diagnosis not present

## 2021-06-29 DIAGNOSIS — R413 Other amnesia: Secondary | ICD-10-CM

## 2021-06-29 DIAGNOSIS — D649 Anemia, unspecified: Secondary | ICD-10-CM | POA: Diagnosis present

## 2021-06-29 DIAGNOSIS — Z79890 Hormone replacement therapy (postmenopausal): Secondary | ICD-10-CM | POA: Diagnosis not present

## 2021-06-29 DIAGNOSIS — Z8673 Personal history of transient ischemic attack (TIA), and cerebral infarction without residual deficits: Secondary | ICD-10-CM | POA: Diagnosis not present

## 2021-06-29 DIAGNOSIS — E039 Hypothyroidism, unspecified: Secondary | ICD-10-CM | POA: Diagnosis not present

## 2021-06-29 DIAGNOSIS — I129 Hypertensive chronic kidney disease with stage 1 through stage 4 chronic kidney disease, or unspecified chronic kidney disease: Secondary | ICD-10-CM | POA: Diagnosis present

## 2021-06-29 DIAGNOSIS — Z79899 Other long term (current) drug therapy: Secondary | ICD-10-CM | POA: Diagnosis not present

## 2021-06-29 DIAGNOSIS — Z9221 Personal history of antineoplastic chemotherapy: Secondary | ICD-10-CM | POA: Diagnosis not present

## 2021-06-29 DIAGNOSIS — I6300 Cerebral infarction due to thrombosis of unspecified precerebral artery: Secondary | ICD-10-CM | POA: Diagnosis not present

## 2021-06-29 DIAGNOSIS — Z886 Allergy status to analgesic agent status: Secondary | ICD-10-CM | POA: Diagnosis not present

## 2021-06-29 DIAGNOSIS — I6330 Cerebral infarction due to thrombosis of unspecified cerebral artery: Secondary | ICD-10-CM | POA: Diagnosis not present

## 2021-06-29 DIAGNOSIS — N3941 Urge incontinence: Secondary | ICD-10-CM | POA: Diagnosis present

## 2021-06-29 DIAGNOSIS — I63511 Cerebral infarction due to unspecified occlusion or stenosis of right middle cerebral artery: Secondary | ICD-10-CM | POA: Diagnosis not present

## 2021-06-29 DIAGNOSIS — N1831 Chronic kidney disease, stage 3a: Secondary | ICD-10-CM | POA: Diagnosis present

## 2021-06-29 DIAGNOSIS — Z9049 Acquired absence of other specified parts of digestive tract: Secondary | ICD-10-CM | POA: Diagnosis not present

## 2021-06-29 DIAGNOSIS — F0390 Unspecified dementia without behavioral disturbance: Secondary | ICD-10-CM | POA: Diagnosis present

## 2021-06-29 LAB — COMPREHENSIVE METABOLIC PANEL
ALT: 11 U/L (ref 0–44)
AST: 20 U/L (ref 15–41)
Albumin: 3.2 g/dL — ABNORMAL LOW (ref 3.5–5.0)
Alkaline Phosphatase: 64 U/L (ref 38–126)
Anion gap: 8 (ref 5–15)
BUN: 23 mg/dL (ref 8–23)
CO2: 25 mmol/L (ref 22–32)
Calcium: 9.8 mg/dL (ref 8.9–10.3)
Chloride: 103 mmol/L (ref 98–111)
Creatinine, Ser: 1.59 mg/dL — ABNORMAL HIGH (ref 0.61–1.24)
GFR, Estimated: 41 mL/min — ABNORMAL LOW (ref >60)
Glucose, Bld: 123 mg/dL — ABNORMAL HIGH (ref 70–99)
Potassium: 3.5 mmol/L (ref 3.5–5.1)
Sodium: 136 mmol/L (ref 135–145)
Total Bilirubin: 0.2 mg/dL — ABNORMAL LOW (ref 0.3–1.2)
Total Protein: 7 g/dL (ref 6.5–8.1)

## 2021-06-29 LAB — CBC WITH DIFFERENTIAL/PLATELET
Abs Immature Granulocytes: 0.05 10*3/uL (ref 0.00–0.07)
Basophils Absolute: 0 10*3/uL (ref 0.0–0.1)
Basophils Relative: 1 %
Eosinophils Absolute: 0.5 10*3/uL (ref 0.0–0.5)
Eosinophils Relative: 6 %
HCT: 36.7 % — ABNORMAL LOW (ref 39.0–52.0)
Hemoglobin: 12.1 g/dL — ABNORMAL LOW (ref 13.0–17.0)
Immature Granulocytes: 1 %
Lymphocytes Relative: 25 %
Lymphs Abs: 2 10*3/uL (ref 0.7–4.0)
MCH: 30.3 pg (ref 26.0–34.0)
MCHC: 33 g/dL (ref 30.0–36.0)
MCV: 91.8 fL (ref 80.0–100.0)
Monocytes Absolute: 0.9 10*3/uL (ref 0.1–1.0)
Monocytes Relative: 11 %
Neutro Abs: 4.4 10*3/uL (ref 1.7–7.7)
Neutrophils Relative %: 56 %
Platelets: 237 10*3/uL (ref 150–400)
RBC: 4 MIL/uL — ABNORMAL LOW (ref 4.22–5.81)
RDW: 12.8 % (ref 11.5–15.5)
WBC: 7.9 10*3/uL (ref 4.0–10.5)
nRBC: 0 % (ref 0.0–0.2)

## 2021-06-29 LAB — LIPID PANEL
Cholesterol: 173 mg/dL (ref 0–200)
HDL: 37 mg/dL — ABNORMAL LOW (ref >40)
LDL Cholesterol: 102 mg/dL — ABNORMAL HIGH (ref 0–99)
Total CHOL/HDL Ratio: 4.7 RATIO
Triglycerides: 170 mg/dL — ABNORMAL HIGH (ref <150)
VLDL: 34 mg/dL (ref 0–40)

## 2021-06-29 LAB — URINALYSIS, ROUTINE W REFLEX MICROSCOPIC
Bilirubin Urine: NEGATIVE
Glucose, UA: NEGATIVE mg/dL
Hgb urine dipstick: NEGATIVE
Ketones, ur: NEGATIVE mg/dL
Leukocytes,Ua: NEGATIVE
Nitrite: NEGATIVE
Protein, ur: NEGATIVE mg/dL
Specific Gravity, Urine: 1.012 (ref 1.005–1.030)
pH: 5 (ref 5.0–8.0)

## 2021-06-29 LAB — HEMOGLOBIN A1C
Hgb A1c MFr Bld: 5.8 % — ABNORMAL HIGH (ref 4.8–5.6)
Mean Plasma Glucose: 119.76 mg/dL

## 2021-06-29 LAB — ECHOCARDIOGRAM COMPLETE
AR max vel: 1.41 cm2
AV Peak grad: 10.6 mmHg
Ao pk vel: 1.63 m/s
Area-P 1/2: 3.12 cm2
Calc EF: 62.2 %
P 1/2 time: 777 msec
S' Lateral: 2.7 cm
Single Plane A2C EF: 65 %
Single Plane A4C EF: 63 %

## 2021-06-29 LAB — CK: Total CK: 83 U/L (ref 49–397)

## 2021-06-29 LAB — TSH: TSH: 1.835 u[IU]/mL (ref 0.350–4.500)

## 2021-06-29 MED ORDER — GADOBUTROL 1 MMOL/ML IV SOLN
6.0000 mL | Freq: Once | INTRAVENOUS | Status: AC | PRN
  Administered 2021-06-29: 6 mL via INTRAVENOUS

## 2021-06-29 MED ORDER — GEMFIBROZIL 600 MG PO TABS
600.0000 mg | ORAL_TABLET | Freq: Every evening | ORAL | Status: DC
  Administered 2021-06-29 – 2021-07-01 (×3): 600 mg via ORAL
  Filled 2021-06-29 (×3): qty 1

## 2021-06-29 MED ORDER — GUAIFENESIN ER 600 MG PO TB12: 600.0000 mg | ORAL_TABLET | Freq: Two times a day (BID) | ORAL | Status: DC | PRN

## 2021-06-29 MED ORDER — ASPIRIN EC 81 MG PO TBEC: 81.0000 mg | DELAYED_RELEASE_TABLET | Freq: Every day | ORAL | Status: DC

## 2021-06-29 MED ORDER — ACETAMINOPHEN 325 MG PO TABS
650.0000 mg | ORAL_TABLET | ORAL | Status: DC | PRN
  Administered 2021-07-03 – 2021-07-07 (×2): 650 mg via ORAL
  Filled 2021-06-29 (×2): qty 2

## 2021-06-29 MED ORDER — ASPIRIN EC 81 MG PO TBEC
81.0000 mg | DELAYED_RELEASE_TABLET | Freq: Every day | ORAL | Status: DC
  Administered 2021-06-29 – 2021-06-30 (×2): 81 mg via ORAL
  Filled 2021-06-29 (×2): qty 1

## 2021-06-29 MED ORDER — SENNOSIDES-DOCUSATE SODIUM 8.6-50 MG PO TABS
1.0000 | ORAL_TABLET | Freq: Every evening | ORAL | Status: DC | PRN
Start: 2021-06-29 — End: 2021-07-07
  Administered 2021-07-02: 1 via ORAL
  Filled 2021-06-29 (×2): qty 1

## 2021-06-29 MED ORDER — POLYVINYL ALCOHOL 1.4 % OP SOLN: 1.0000 [drp] | Freq: Every day | OPHTHALMIC | Status: DC | PRN

## 2021-06-29 MED ORDER — LEVOTHYROXINE SODIUM 100 MCG PO TABS
100.0000 ug | ORAL_TABLET | Freq: Every day | ORAL | Status: DC
  Administered 2021-06-30 – 2021-07-07 (×8): 100 ug via ORAL
  Filled 2021-06-29 (×8): qty 1

## 2021-06-29 MED ORDER — LORAZEPAM 2 MG/ML IJ SOLN: 1.0000 mg | Freq: Once | INTRAMUSCULAR | Status: DC | PRN

## 2021-06-29 MED ORDER — DOCUSATE SODIUM 100 MG PO CAPS
200.0000 mg | ORAL_CAPSULE | Freq: Every day | ORAL | Status: DC
Start: 2021-06-29 — End: 2021-07-07
  Administered 2021-06-29 – 2021-07-07 (×9): 200 mg via ORAL
  Filled 2021-06-29 (×9): qty 2

## 2021-06-29 MED ORDER — SODIUM CHLORIDE 0.9 % IV SOLN: Freq: Once | INTRAVENOUS | Status: AC

## 2021-06-29 MED ORDER — STROKE: EARLY STAGES OF RECOVERY BOOK: Freq: Once | Status: DC

## 2021-06-29 MED ORDER — ASPIRIN EC 81 MG PO TBEC
81.0000 mg | DELAYED_RELEASE_TABLET | Freq: Every day | ORAL | Status: DC
Start: 2021-06-29 — End: 2021-06-29

## 2021-06-29 MED ORDER — ACETAMINOPHEN 650 MG RE SUPP: 650.0000 mg | RECTAL | Status: DC | PRN

## 2021-06-29 MED ORDER — ACETAMINOPHEN 160 MG/5ML PO SOLN: 650.0000 mg | ORAL | Status: DC | PRN

## 2021-06-29 MED ORDER — MIRABEGRON ER 25 MG PO TB24
25.0000 mg | ORAL_TABLET | Freq: Every day | ORAL | Status: DC
  Administered 2021-06-29 – 2021-07-06 (×8): 25 mg via ORAL
  Filled 2021-06-29 (×10): qty 1

## 2021-06-29 MED ORDER — ENOXAPARIN SODIUM 30 MG/0.3ML IJ SOSY
30.0000 mg | PREFILLED_SYRINGE | INTRAMUSCULAR | Status: DC
  Administered 2021-06-29 – 2021-07-07 (×9): 30 mg via SUBCUTANEOUS
  Filled 2021-06-29 (×9): qty 0.3

## 2021-06-29 MED ORDER — LORATADINE 10 MG PO TABS: 10.0000 mg | ORAL_TABLET | Freq: Every day | ORAL | Status: DC | PRN

## 2021-06-29 NOTE — Assessment & Plan Note (Addendum)
-  Follow-up lipid panel -Continue gemfibrozil -Consider need to add on statin if LDL not less than 70.

## 2021-06-29 NOTE — ED Provider Notes (Incomplete)
°  7:13 AM Patient signed out to me by previous ED physician. Pt is a 88 yo patient with pmh of CVA with previous left sided weakness that resolved presenting for multiple falls. Pt has fallen multiple times in the past weeks with reoccurrence of left sided deficits.   Workup: CTH negative. No bony fractures.  Plan: MRI to r/o stroke  Physical Exam  BP (!) 137/56 (BP Location: Left Arm)    Pulse (!) 55    Temp 97.6 F (36.4 C) (Oral)    Resp 17    SpO2 100%   Physical Exam  Procedures  Procedures  ED Course / MDM    Medical Decision Making Amount and/or Complexity of Data Reviewed Labs: ordered. Radiology: ordered. ECG/medicine tests: ordered.  Risk Prescription drug management.   ***

## 2021-06-29 NOTE — Assessment & Plan Note (Addendum)
Patient presents after having recurrent falls.  Daughter noted signs of weakness on the left side 4-5 days ago.  MRI noted small acute perforator infarct of the right corona radiata and advanced right M1 segment stenosis.  Given unclear onset of symptoms patient was not a candidate for tPA. -Admit to telemetry bed -Stroke order set initiated -Neuro checks -Check Hemoglobin A1c and lipid panel in a.m. -Check echocardiogram -PT/OT/Speech to eval and treat -Continue ASA 81 mg  -Appreciate neurology consultative services, will follow-up for any further recommendation

## 2021-06-29 NOTE — ED Notes (Signed)
Patient transported to MRI 

## 2021-06-29 NOTE — ED Notes (Signed)
Pt returned from MRI °

## 2021-06-29 NOTE — ED Notes (Signed)
Patient transported to CT 

## 2021-06-29 NOTE — Assessment & Plan Note (Signed)
Last hemoglobin A1c was 6.3.  Patient not on any diabetic medications. -Follow-up repeat hemoglobin A1c

## 2021-06-29 NOTE — Assessment & Plan Note (Signed)
Daughter notes that the patient does get easily disoriented and confused. -Delirium precautions -Bed alarm on -May warrant sitter

## 2021-06-29 NOTE — ED Notes (Signed)
Echo at bedside

## 2021-06-29 NOTE — Assessment & Plan Note (Addendum)
Increasing frequency of falls likely due to new stroke as noted above.  Patient is no longer able to live in assisted living at this time and needs higher level of care. -Fall precautions -PT/OT to evaluate and treat -Transitions of care consulted for need of  placement

## 2021-06-29 NOTE — Assessment & Plan Note (Signed)
TSH was 1.835.  Home medication regimen includes levothyroxine 100 mcg daily. -Continue levothyroxine

## 2021-06-29 NOTE — ED Notes (Signed)
Tele tracking placed 

## 2021-06-29 NOTE — ED Notes (Signed)
Patient returned to hallway from [place].

## 2021-06-29 NOTE — Assessment & Plan Note (Addendum)
Blood pressure was elevated up to 170/68.  He is not on any medications for blood pressure and takes furosemide as needed for fluid. -Allowing for permissive hypertension at this time.

## 2021-06-29 NOTE — Assessment & Plan Note (Addendum)
Methotrexate was discontinued after patient developed ulcerative stomatitis for which it appears it was discontinued on 08/07/2020 after being restarted 08/01/2020.

## 2021-06-29 NOTE — Assessment & Plan Note (Signed)
Hemoglobin 12.1 which appears near patient's baseline. -Continue to monitor

## 2021-06-29 NOTE — H&P (Addendum)
History and Physical    Patient: [name] XBW:[id number] DOB: 09-Mar-1929 DOA: 06/28/2021 DOS: the patient was seen and examined on 06/29/2021 PCP: [name], DO  Patient coming from: [facility]  Chief Complaint:  Chief Complaint  Patient presents with   Fall    HPI: [name] is a 88 y.o. male with medical history significant of hypertension, CVA, CKD 3, hypothyroidism, colon cancer s/p hemicolectomy with chemotherapy, RA, and memory issues who presents with presented after having a fall.  Patient is supposed to get around with use of a cane or walker, but daughter states that he likely does not use it when no one is around.  He lives with his wife currently in independent living at [facility].  History is obtained from the patient with the assistance of his daughter who is present at bedside.  Over the last 2 weeks it seemed to decline and was reported to have been falling 1-2 per day.  Daughter reports that his wife had stated that he just woke up and was weak 1 day.  Falls were initially thought to patient either tripping or his knee giving out.  He been seen in the ED on 2/2 and 2/3 due to falls and weakness.  Physical therapy had been arranged, but there was some miscommunication during the 2/3 ED visit as they thought that the patient had access to more intensive rehab opportunities and did not decide for admission.  His daughter picked him up after the visit and noticed that he was needing more assistance especially on the left side while getting him back into the car.  He was able to be evaluated by physical therapy who suspected that symptoms were more likely secondary to her stroke.  He followed up with his primary care provider yesterday through a televisit and paperwork was sent to request SNF placement.  However at around 10 PM  last night patient had another fall landing on his left side.  He complained of left sided headache, arm pain, and hip  pain.  Upon admission in the emergency department patient was noted to be afebrile, pulse 55-72, blood pressures 136/58-170/68, and all other vital signs maintained.  Initial x-rays of the chest, humerus, and shoulder did not note any acute abnormalities.  Labs noted hemoglobin 12.1, BUN 23, and creatinine 1.59 which all appear to be relatively within patient baseline.  CT scan of the head and cervical spine did not note any acute abnormality.  Urinalysis did not show any significant signs of infection.  Due to patient's symptoms MRI was obtained.  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 with advanced right M1 segment stenosis.  Review of Systems: As mentioned in the history of present illness. All other systems reviewed and are negative. Past Medical History:  Diagnosis Date   Adenocarcinoma ([place]) 1985   colon s/p right hemicolectomy and chemotherapy. F/U with cancer center and Dr. Lucia Gaskins rotinely   Depression    Eczema    Epistaxis    f/u per ENT   Erectile dysfunction    Gastric ulcer 12/11   H-Pylori Tx, EGD 3-12: gastritis   Hay fever    Head injury 11/2017   with fall   Hyperlipemia    Hypertension    Hypothyroidism    Hypothyroid   Insomnia    Osteopenia     per DEXA 07-2008 (Rx fosamax)   Rheumatoid arthritis ([place])    Stroke (  [place])     " balance issue"   Past Surgical History:  Procedure Laterality Date   COLON SURGERY     Right / Adenocarcinoma / Chemo   COLONOSCOPY  2005, 2008   Dr. Lucia Gaskins   INGUINAL HERNIA REPAIR     Right   IR ANGIO INTRA EXTRACRAN SEL COM CAROTID INNOMINATE BILAT MOD SED  12/13/2017   IR ANGIO VERTEBRAL SEL VERTEBRAL BILAT MOD SED  12/13/2017   IR PTA INTRACRANIAL  12/18/2017   RADIOLOGY WITH ANESTHESIA N/A 12/18/2017   Procedure: Angioplasty with stenting;  Surgeon: Luanne Bras, MD;  Location: [facility];  Service: Radiology;  Laterality: N/A;   Social History:  reports that he has never smoked. He has  never used smokeless tobacco. He reports that he does not drink alcohol and does not use drugs.  Allergies  Allergen Reactions   Methotrexate Derivatives Other (See Comments)    Mouth sores / pancytopenia   Asa [Aspirin] Other (See Comments)    Gastric symptoms - can tolerate 81 mg aspirin   Penicillin G Rash    Did it involve swelling of the face/tongue/throat, SOB, or low BP? Unknown Did it involve sudden or severe rash/hives, skin peeling, or any reaction on the inside of your mouth or nose? Unknown Did you need to seek medical attention at a hospital or doctor's office? Unknown When did it last happen?   unknown    If all above answers are NO, may proceed with cephalosporin use.    Family History  Problem Relation Age of Onset   Diabetes Neg Hx    Heart attack Neg Hx    Colon cancer Neg Hx    Prostate cancer Neg Hx     Prior to Admission medications   Medication Sig Start Date End Date Taking? Authorizing Provider  aspirin EC 81 MG tablet Take 81 mg by mouth daily. Swallow whole.   Yes [provider]  docusate sodium (COLACE) 100 MG capsule Take 200 mg by mouth daily.   Yes [provider]  furosemide (LASIX) 20 MG tablet Take 1 tablet (20 mg total) by mouth daily. Patient taking differently: Take 20 mg by mouth daily as needed for fluid or edema. 05/13/20  Yes [name] R, DO  gemfibrozil (LOPID) 600 MG tablet TAKE 1 TABLET BY MOUTH EVERY DAY Patient taking differently: Take 600 mg by mouth every evening. 03/28/21  Yes [name], DO  guaiFENesin (MUCINEX) 600 MG 12 hr tablet Take 600 mg by mouth 2 (two) times daily as needed for cough or to loosen phlegm.   Yes [provider]  levothyroxine (SYNTHROID) 100 MCG tablet Take 1 tablet (100 mcg total) by mouth daily before breakfast. 03/01/21  Yes Carollee Herter, Yvonne R, DO  loratadine (CLARITIN) 10 MG tablet Take 10 mg by mouth daily.   Yes [provider]  mirabegron ER  (MYRBETRIQ) 25 MG TB24 tablet Take 25 mg by mouth at bedtime.   Yes [provider]  Multiple Vitamins-Minerals (MULTIVITAMIN ADULT) CHEW Chew 2 each by mouth daily.   Yes [provider]  NONFORMULARY OR COMPOUNDED ITEM 4 wheeled walker   #1  dx frequent falls, weakness 06/09/21  Yes [name] R, DO  polyvinyl alcohol (LIQUIFILM TEARS) 1.4 % ophthalmic solution Place 1 drop into both eyes daily as needed for dry eyes.   Yes [provider]  traMADol (ULTRAM) 50 MG tablet Take 0.5 tablets (25 mg total) by mouth  every 6 (six) hours as needed for severe pain. 03/07/21  Yes [name] R, DO  fluticasone (FLONASE) 50 MCG/ACT nasal spray Place 2 sprays into both nostrils daily. Patient not taking: Reported on 06/29/2021 06/09/21   Carollee Herter, Alferd Apa, DO  losartan (COZAAR) 25 MG tablet Take 1 tablet (25 mg total) by mouth daily. Patient not taking: Reported on 06/29/2021 06/02/21   [name], DO    Physical Exam: Vitals:   06/28/21 2346 06/29/21 0211 06/29/21 0447  BP: (!) 170/68 (!) 136/58 (!) 137/56  Pulse: 72 68 (!) 55  Resp: _0 Temp: (!) 97.5 F (36.4 C)  97.6 F (36.4 C)  TempSrc: Oral  Oral  SpO2: 97% 94% 100%   Exam  Constitutional: Elderly male currently in no acute distress Eyes: PERRL, lids and conjunctivae normal ENMT: Mucous membranes are moist.  Neck: normal, supple, no masses, no thyromegaly Respiratory: clear to auscultation bilaterally, no wheezing, no crackles. Normal respiratory effort. No accessory muscle use.  Cardiovascular: Regular rate and rhythm, no murmurs / rubs / gallops. No extremity edema.  Abdomen: no tenderness, no masses palpated. Bowel sounds positive.  Musculoskeletal: no clubbing / cyanosis.  Good ROM, no contractures. Normal muscle tone.  Skin: Abrasions appreciated of the lower EXTR  neurologic: CN 2-12 grossly intact. Sensation intact.  Strength 4/5 on the left upper and lower extremity with  drift present.  Strength otherwise 5/5 in the right upper and lower extremity. Psychiatric: Normal judgment and insight. Alert and oriented x 3. Normal mood.    Data Reviewed:      Assessment and Plan: * CVA (cerebral vascular accident) [facility]) with intracranial vascular stenosis- (present on admission) Patient presents after having recurrent falls.  Daughter noted signs of weakness on the left side 4-5 days ago.  MRI noted small acute perforator infarct of the right corona radiata and advanced right M1 segment stenosis.  Given unclear onset of symptoms patient was not a candidate for tPA. -Admit to telemetry bed -Stroke order set initiated -Neuro checks -Check Hemoglobin A1c and lipid panel in a.m. -Check echocardiogram -PT/OT/Speech to eval and treat -Continue ASA 81 mg  -Appreciate neurology consultative services, will follow-up for any further recommendation  Frequent falls Increasing frequency of falls likely due to new stroke as noted above.  Patient is no longer able to live in assisted living at this time and needs higher level of care. -Fall precautions -PT/OT to evaluate and treat -Transitions of care consulted for need of  placement  Memory loss- (present on admission) Daughter notes that the patient does get easily disoriented and confused. -Delirium precautions -Bed alarm on -May warrant sitter  Hypothyroidism- (present on admission) TSH was 1.835.  Home medication regimen includes levothyroxine 100 mcg daily. -Continue levothyroxine  History of stroke He previously had a stroke back in 2019 with small acute infarcts in both cerebral hemispheres.  Patient underwent cerebral angiogram with distal vertebrobasilar junction/proximal basilar artery stenosis angioplasty with Dr. Estanislado Pandy.   Normocytic anemia- (present on admission) Hemoglobin 12.1 which appears near patient's baseline. -Continue to monitor  Prediabetes- (present on admission) Last hemoglobin A1c  was 6.3.  Patient not on any diabetic medications. -Follow-up repeat hemoglobin A1c  Kidney disease, chronic, stage III (moderate, EGFR 30-59 ml/min) (HCC)- (present on admission) On admission creatinine 1.59 with a BUN 23 which appears near patient's baseline. -Continue to monitor  Dyslipidemia- (present on admission) -Follow-up lipid panel -Continue gemfibrozil -Consider need to add on statin  if LDL not less than 70.  Rheumatoid arthritis ([place])- (present on admission) Methotrexate was discontinued after patient developed ulcerative stomatitis for which it appears it was discontinued on 08/07/2020 after being restarted 08/01/2020.  Essential hypertension- (present on admission) Blood pressure was elevated up to 170/68.  He is not on any medications for blood pressure and takes furosemide as needed for fluid. -Allowing for permissive hypertension at this time.       Advance Care Planning:   Code Status: Full Code   Consults: Neurology  Family Communication: Daughter updated at bedside  Severity of Illness: The appropriate patient status for this patient is INPATIENT. Inpatient status is judged to be reasonable and necessary in order to provide the required intensity of service to ensure the patient's safety. The patient's presenting symptoms, physical exam findings, and initial radiographic and laboratory data in the context of their chronic comorbidities is felt to place them at high risk for further clinical deterioration. Furthermore, it is not anticipated that the patient will be medically stable for discharge from the hospital within 2 midnights of admission.   * I certify that at the point of admission it is my clinical judgment that the patient will require inpatient hospital care spanning beyond 2 midnights from the point of admission due to high intensity of service, high risk for further deterioration and high frequency of surveillance required.*  Author: Norval Morton,  MD 06/29/2021 7:41 AM  For on call review [email or web address].

## 2021-06-29 NOTE — Assessment & Plan Note (Signed)
On admission creatinine 1.59 with a BUN 23 which appears near patient's baseline. -Continue to monitor

## 2021-06-29 NOTE — Assessment & Plan Note (Signed)
He previously had a stroke back in 2019 with small acute infarcts in both cerebral hemispheres.  Patient underwent cerebral angiogram with distal vertebrobasilar junction/proximal basilar artery stenosis angioplasty with Dr. Estanislado Pandy.

## 2021-06-29 NOTE — ED Notes (Signed)
Patient transported to X-ray 

## 2021-06-30 DIAGNOSIS — W19XXXS Unspecified fall, sequela: Secondary | ICD-10-CM

## 2021-06-30 DIAGNOSIS — I639 Cerebral infarction, unspecified: Secondary | ICD-10-CM

## 2021-06-30 DIAGNOSIS — E039 Hypothyroidism, unspecified: Secondary | ICD-10-CM

## 2021-06-30 LAB — BASIC METABOLIC PANEL
Anion gap: 11 (ref 5–15)
BUN: 22 mg/dL (ref 8–23)
CO2: 23 mmol/L (ref 22–32)
Calcium: 9.6 mg/dL (ref 8.9–10.3)
Chloride: 103 mmol/L (ref 98–111)
Creatinine, Ser: 1.53 mg/dL — ABNORMAL HIGH (ref 0.61–1.24)
GFR, Estimated: 43 mL/min — ABNORMAL LOW (ref >60)
Glucose, Bld: 101 mg/dL — ABNORMAL HIGH (ref 70–99)
Potassium: 4 mmol/L (ref 3.5–5.1)
Sodium: 137 mmol/L (ref 135–145)

## 2021-06-30 LAB — CBC
HCT: 40.6 % (ref 39.0–52.0)
Hemoglobin: 13.1 g/dL (ref 13.0–17.0)
MCH: 28.9 pg (ref 26.0–34.0)
MCHC: 32.3 g/dL (ref 30.0–36.0)
MCV: 89.6 fL (ref 80.0–100.0)
Platelets: 249 10*3/uL (ref 150–400)
RBC: 4.53 MIL/uL (ref 4.22–5.81)
RDW: 12.8 % (ref 11.5–15.5)
WBC: 8 10*3/uL (ref 4.0–10.5)
nRBC: 0 % (ref 0.0–0.2)

## 2021-06-30 MED ORDER — CLOPIDOGREL BISULFATE 75 MG PO TABS
300.0000 mg | ORAL_TABLET | Freq: Once | ORAL | Status: AC
Start: 2021-06-30 — End: 2021-06-30
  Administered 2021-06-30: 300 mg via ORAL
  Filled 2021-06-30: qty 4

## 2021-06-30 MED ORDER — MELATONIN 3 MG PO TABS
3.0000 mg | ORAL_TABLET | Freq: Every evening | ORAL | Status: DC | PRN
  Administered 2021-06-30 – 2021-07-06 (×5): 3 mg via ORAL
  Filled 2021-06-30 (×5): qty 1

## 2021-06-30 MED ORDER — CLOPIDOGREL BISULFATE 75 MG PO TABS
75.0000 mg | ORAL_TABLET | Freq: Every day | ORAL | Status: DC
  Administered 2021-07-01 – 2021-07-07 (×7): 75 mg via ORAL
  Filled 2021-06-30 (×7): qty 1

## 2021-06-30 MED ORDER — ATORVASTATIN CALCIUM 40 MG PO TABS
40.0000 mg | ORAL_TABLET | Freq: Every day | ORAL | Status: DC
  Administered 2021-06-30 – 2021-07-07 (×8): 40 mg via ORAL
  Filled 2021-06-30 (×8): qty 1

## 2021-06-30 MED ORDER — ASPIRIN EC 325 MG PO TBEC
325.0000 mg | DELAYED_RELEASE_TABLET | Freq: Every day | ORAL | Status: DC
  Administered 2021-07-01 – 2021-07-07 (×7): 325 mg via ORAL
  Filled 2021-06-30 (×7): qty 1

## 2021-06-30 NOTE — Evaluation (Signed)
Occupational Therapy Evaluation Patient Details Name: [name] MRN: [id number] DOB: Mar 09, 1929 Today's Date: 06/30/2021   History of Present Illness 88 y.o. male presented 06/29/21 after having a fall. 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  PMH significant of hypertension, CVA, CKD 3, hypothyroidism, colon cancer s/p hemicolectomy with chemotherapy, RA, and memory issues   Clinical Impression   Pt presents with decline in function and safety with ADLs and ADL mobility with impaired strength, balance, endurance and L UE AROM. PTA  pt was living with his wife in [place] with ADLs (assist with socks and shoes) and was supposed to use a RW (however per daughter, often did not use), and having multiple falls per month (>10x/month).  Pt currently requires mod A with UB ADLs, max - total A with LB ADLs, total A with toileting and  +2 max assist for standing due to posterior lean. Pt would benefit from acute OT services to address impairments to maximize level of function and safety with ADLs and ADL mobility     Recommendations for follow up therapy are one component of a multi-disciplinary discharge planning process, led by the attending physician.  Recommendations may be updated based on patient status, additional functional criteria and insurance authorization.   Follow Up Recommendations  Acute inpatient rehab (3hours/day)    Assistance Recommended at Discharge    Patient can return home with the following A lot of help with bathing/dressing/bathroom;Two people to help with walking and/or transfers;Direct supervision/assist for medications management;Direct supervision/assist for financial management;Assist for transportation;Help with stairs or ramp for entrance    Functional Status Assessment  Patient has had a recent decline in their functional status and demonstrates the ability to make significant improvements in function in a  reasonable and predictable amount of time.  Equipment Recommendations  Tub/shower seat;Other (comment) (TBD at next venue of care)    Recommendations for Other Services       Precautions / Restrictions Precautions Precautions: Fall Precaution Comments: has fallen >10x in past month (per daughter); sometimes 2x/day Restrictions Weight Bearing Restrictions: No      Mobility Bed Mobility               General bed mobility comments: up in recliner on arrival    Transfers Overall transfer level: Needs assistance Equipment used: 2 person hand held assist Transfers: Sit to/from Stand Sit to Stand: Max assist, +2 physical assistance           General transfer comment: posterior lean standing at chair      Balance Overall balance assessment: Needs assistance Sitting-balance support: No upper extremity supported, Feet unsupported Sitting balance-Leahy Scale: Poor   Postural control: Posterior lean Standing balance support: Bilateral upper extremity supported Standing balance-Leahy Scale: Zero                             ADL either performed or assessed with clinical judgement   ADL Overall ADL's : Needs assistance/impaired Eating/Feeding: Set up;Supervision/ safety;Sitting   Grooming: Minimal assistance;Sitting Grooming Details (indicate cue type and reason): Impaired dynamic sitting balannce Upper Body Bathing: Moderate assistance Upper Body Bathing Details (indicate cue type and reason): Impaired dynamic sitting balance Lower Body Bathing: Maximal assistance   Upper Body Dressing : Moderate assistance Upper Body Dressing Details (indicate cue type and reason): Impaired dynamic sitting balannce Lower Body Dressing: Total assistance     Toilet Transfer  Details (indicate cue type and reason): unsafe to attemot at this time, max A +2 sit - stand with posterior lean Toileting- Clothing Manipulation and Hygiene: Total assistance       Functional  mobility during ADLs: Maximal assistance;+2 for physical assistance       Vision Baseline Vision/History: 1 Wears glasses Ability to See in Adequate Light: 0 Adequate Patient Visual Report: No change from baseline       Perception     Praxis      Pertinent Vitals/Pain Pain Assessment Pain Assessment: Faces Faces Pain Scale: Hurts a little bit Pain Location: left shoulder Pain Descriptors / Indicators: Guarding Pain Intervention(s): Limited activity within patient's tolerance, Monitored during session, Repositioned     Hand Dominance Right   Extremity/Trunk Assessment Upper Extremity Assessment Upper Extremity Assessment: Generalized weakness;LUE deficits/detail LUE Deficits / Details: L UE AROM impaired, pt's daughter reports that pt has had decreased ROM at baseline, arthritis LUE: Shoulder pain with ROM   Lower Extremity Assessment Lower Extremity Assessment: Defer to PT evaluation   Cervical / Trunk Assessment Cervical / Trunk Assessment: Other exceptions Cervical / Trunk Exceptions: posterior bias   Communication Communication Communication: No difficulties   Cognition Arousal/Alertness: Awake/alert Behavior During Therapy: WFL for tasks assessed/performed Overall Cognitive Status: History of cognitive impairments - at baseline                                 General Comments: daughter reprts that PTA he would have moments of confusion     General Comments  Daughter, [name], present during evaluation    Exercises     Shoulder Instructions      Home Living Family/patient expects to be discharged to:: Other (Comment) (ILF) Living Arrangements: Spouse/significant other Available Help at Discharge: Family;Available 24 hours/day;Personal care attendant Type of Home: House Home Access: Level entry     Home Layout: One level     Bathroom Shower/Tub: Occupational psychologist: Standard     Home Equipment: Conservation officer, nature (2  wheels);Rollator (4 wheels);Grab bars - tub/shower   Additional Comments: daughter reoprts that pt has had 10+ falls      Prior Functioning/Environment Prior Level of Function : Needs assist       Physical Assist : ADLs (physical)   ADLs (physical): Dressing Mobility Comments: has cane and RW but does not always use AD ADLs Comments: needed assist with socks and  shoes        OT Problem List: Decreased strength;Impaired balance (sitting and/or standing);Decreased cognition;Decreased safety awareness;Decreased activity tolerance;Decreased coordination;Decreased knowledge of use of DME or AE      OT Treatment/Interventions: Self-care/ADL training;Patient/family education;Balance training;Therapeutic activities;DME and/or AE instruction    OT Goals(Current goals can be found in the care plan section) Acute Rehab OT Goals OT Goal Formulation: With patient/family Time For Goal Achievement: 07/14/21 Potential to Achieve Goals: Good ADL Goals Pt Will Perform Grooming: with min guard assist;with supervision;sitting Pt Will Perform Upper Body Bathing: with min guard assist;sitting Pt Will Perform Lower Body Bathing: with mod assist;sitting/lateral leans Pt Will Perform Upper Body Dressing: with min assist;sitting Pt Will Transfer to Toilet: with max assist;with mod assist;stand pivot transfer;ambulating;bedside commode Additional ADL Goal #1: Pt will tolerate static sitting balance/tolerance tasks with min A for balance/support for functional tasks Additional ADL Goal #2: Pt will tolerate gentle A/AAROM of L UE/shoulder to improve functional use  OT Frequency: Min 2X/week  Co-evaluation              AM-PAC OT "6 Clicks" Daily Activity     Outcome Measure Help from another person eating meals?: None Help from another person taking care of personal grooming?: A Little Help from another person toileting, which includes using toliet, bedpan, or urinal?: Total Help from another  person bathing (including washing, rinsing, drying)?: A Lot Help from another person to put on and taking off regular upper body clothing?: A Little Help from another person to put on and taking off regular lower body clothing?: Total 6 Click Score: 14   End of Session Equipment Utilized During Treatment: Gait belt Nurse Communication: Mobility status  Activity Tolerance: Patient limited by fatigue Patient left: in chair;with call bell/phone within reach;with chair alarm set;with family/visitor present  OT Visit Diagnosis: Unsteadiness on feet (R26.81);Other abnormalities of gait and mobility (R26.89);History of falling (Z91.81);Muscle weakness (generalized) (M62.81);Other symptoms and signs involving cognitive function                Time: 0349-1791 OT Time Calculation (min): 30 min Charges:  OT General Charges $OT Visit: 1 Visit OT Evaluation $OT Eval Moderate Complexity: 1 Mod    Britt Bottom 06/30/2021, 3:11 PM

## 2021-06-30 NOTE — [place]2 (Signed)
[place] MEDICAID FL2 LEVEL OF CARE SCREENING TOOL     IDENTIFICATION  Patient Name: [name] Birthdate: 1928-12-07 Sex: male Admission Date (Current Location): 06/28/2021  [facility] and [place] Number:  Herbalist and Address:  [facility], [place] [address] [place], [place] [address]      Provider Number: [id number]  Attending Physician Name and Address:  Elgergawy, Silver Huguenin, MD  Relative Name and Phone Number:       Current Level of Care: Hospital Recommended Level of Care: [place] Prior Approval Number:    Date Approved/Denied:   PASRR Number: [id number]  Discharge Plan: SNF    Current Diagnoses: Patient Active Problem List   Diagnosis Date Noted   CVA (cerebral vascular accident) [facility]) with intracranial vascular stenosis 06/29/2021   Intracranial vascular stenosis 06/29/2021   Prediabetes 06/29/2021   Normocytic anemia 06/29/2021   History of stroke 06/29/2021   Other drug-induced neutropenia ([place]) 06/09/2021   Malignant neoplasm of ascending colon ([place]) 06/09/2021   Stenosis of artery ([place]) 06/09/2021   Preventative health care 03/01/2021   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   Kidney disease, chronic, stage III (moderate, EGFR 30-59 ml/min) ([place]) 08/07/2020   Leukopenia 08/07/2020   Acute respiratory failure with hypoxia ([place]) 08/07/2020   SIRS (systemic inflammatory response syndrome) ([place]) 08/07/2020   Methotrexate adverse reaction, initial encounter    Acute pain of right shoulder 04/14/2020   Lower extremity edema 11/13/2019   Head trauma 11/13/2019   Dyslipidemia 11/13/2019   Memory loss 07/28/2019   Chronic right shoulder pain 07/28/2019   Frequent falls 07/28/2019   Balance problem 07/28/2019   Dysphagia 07/28/2019   Traumatic hemorrhage of cerebrum, unspecified, without loss of consciousness, initial encounter ([place])    Basilar artery stenosis with infarction ([place]) 12/18/2017   TIA (transient  ischemic attack) 12/10/2017   Rheumatoid arthritis ([place])    Chest pain 06/02/2011   General medical examination 04/11/2011   PEPTIC ULCER DISEASE, HELICOBACTER PYLORI POSITIVE 07/12/2010   PERSONAL HISTORY MALIG NEOPLASM LARGE INTESTINE 05/18/2010   MITRAL VALVE DISORDERS 04/05/2009   HOARSENESS 12/04/2008   OSTEOPENIA 08/26/2008   HYPERTRIGLYCERIDEMIA 07/23/2008   EPISTAXIS 07/26/2007   ERECTILE DYSFUNCTION 02/22/2007   ECZEMA 12/07/2006   LEG CRAMPS 10/09/2006   Hypothyroidism 08/28/2006   Essential hypertension 08/28/2006   HAY FEVER 08/28/2006   INSOMNIA 08/28/2006    Orientation RESPIRATION BLADDER Height & Weight      (Speaks Vietnamese)  Normal Incontinent, External catheter Weight:   Height:     BEHAVIORAL SYMPTOMS/MOOD NEUROLOGICAL BOWEL NUTRITION STATUS      Continent Diet (see dc summary)  AMBULATORY STATUS COMMUNICATION OF NEEDS Skin   Extensive Assist Verbally Normal                       Personal Care Assistance Level of Assistance  Bathing, Feeding, Dressing Bathing Assistance: Maximum assistance Feeding assistance: Independent Dressing Assistance: Maximum assistance     Functional Limitations Info             SPECIAL CARE FACTORS FREQUENCY  PT (By licensed PT), OT (By licensed OT)     PT Frequency: 5x/week OT Frequency: 5x/week            Contractures Contractures Info: Not present    Additional Factors Info  Code Status, Allergies Code Status Info: Full Allergies Info: Methotrexate Derivatives, Asa (Aspirin), Penicillin G           Current  Medications (06/30/2021):  This is the current hospital active medication list Current Facility-Administered Medications  Medication Dose Route Frequency Provider Last Rate Last Admin    stroke: mapping our early stages of recovery book   Does not apply Once Norval Morton, MD       acetaminophen (TYLENOL) tablet 650 mg  650 mg Oral Q4H PRN Norval Morton, MD       Or   acetaminophen  (TYLENOL) 160 MG/5ML solution 650 mg  650 mg Per Tube Q4H PRN Fuller Plan A, MD       Or   acetaminophen (TYLENOL) suppository 650 mg  650 mg Rectal Q4H PRN Fuller Plan A, MD       aspirin EC tablet 81 mg  81 mg Oral Daily Smith, Rondell A, MD   81 mg at 06/30/21 0841   docusate sodium (COLACE) capsule 200 mg  200 mg Oral Daily Smith, Rondell A, MD   200 mg at 06/30/21 0841   enoxaparin (LOVENOX) injection 30 mg  30 mg Subcutaneous Q24H Smith, Rondell A, MD   30 mg at 06/30/21 0842   gemfibrozil (LOPID) tablet 600 mg  600 mg Oral QPM Smith, Rondell A, MD   600 mg at 06/29/21 1847   guaiFENesin (MUCINEX) 12 hr tablet 600 mg  600 mg Oral BID PRN Norval Morton, MD       levothyroxine (SYNTHROID) tablet 100 mcg  100 mcg Oral QAC breakfast Fuller Plan A, MD   100 mcg at 06/30/21 0538   loratadine (CLARITIN) tablet 10 mg  10 mg Oral Daily PRN Fuller Plan A, MD       LORazepam (ATIVAN) injection 1 mg  1 mg Intravenous Once PRN Fuller Plan A, MD       mirabegron ER (MYRBETRIQ) tablet 25 mg  25 mg Oral QHS Smith, Rondell A, MD   25 mg at 06/29/21 2215   polyvinyl alcohol (LIQUIFILM TEARS) 1.4 % ophthalmic solution 1 drop  1 drop Both Eyes Daily PRN Fuller Plan A, MD       senna-docusate (Senokot-S) tablet 1 tablet  1 tablet Oral QHS PRN Norval Morton, MD         Discharge Medications: Please see discharge summary for a list of discharge medications.  Relevant Imaging Results:  Relevant Lab Results:   Additional Information SSN: [id number]. PFIZER Comrnaty(Gray TOP) Covid-19 Vaccine 08/24/2020  Pfizer COVID-19 Vaccine 02/20/2020 , 06/21/2019 , 05/31/2019  Pfizer Covid-19 Vaccine Bivalent Booster 05/05/2021  Benard Halsted, LCSW

## 2021-06-30 NOTE — Progress Notes (Signed)
PROGRESS NOTE    [name]  XKP:[id number] DOB: 15-Aug-1928 DOA: 06/28/2021 PCP: [name], DO   Chief Complaint  Patient presents with   Fall    Brief Narrative:    [name] is a 88 y.o. male with medical history significant of hypertension, CVA, CKD 3, hypothyroidism, colon cancer s/p hemicolectomy with chemotherapy, RA, and memory issues who presents with presented after having a fall, his work up significant for acute CVA.   Assessment & Plan:   Principal Problem:   CVA (cerebral vascular accident) [facility]) with intracranial vascular stenosis Active Problems:   Hypothyroidism   Essential hypertension   Rheumatoid arthritis ([place])   Memory loss   Frequent falls   Dyslipidemia   Kidney disease, chronic, stage III (moderate, EGFR 30-59 ml/min) (HCC)   Intracranial vascular stenosis   Prediabetes   Normocytic anemia   History of stroke  CVA (cerebral vascular accident) ([place]) with intracranial vascular stenosis- (present on admission) Patient presents after having recurrent falls.  Daughter noted signs of weakness on the left side 4-5 days ago.  -  MRI noted small acute perforator infarct of the right corona radiata and advanced right M1 segment stenosis.  Given unclear onset of symptoms patient was not a candidate for tPA. -Was seen by PT/OT, recommendation for CIR which has been consulted. -Continue with aspirin, and allow for permissive hypertension. -She is acceptable at 5.8 -LDL  is elevated at 102  Frequent falls -  frequency of falls likely due to new stroke as noted above.   -Fall precautions -PT/OT to evaluate and treat   Memory loss- (present on admission) Daughter notes that the patient does get easily disoriented and confused. -Delirium precautions -TSH within normal limit, will check B12.   Hypothyroidism- (present on admission) TSH was 1.835.  Home medication regimen includes levothyroxine 100 mcg daily. -Continue levothyroxine   History  of stroke He previously had a stroke back in 2019 with small acute infarcts in both cerebral hemispheres.  Patient underwent cerebral angiogram with distal vertebrobasilar junction/proximal basilar artery stenosis angioplasty with Dr. Estanislado Pandy.    Normocytic anemia- (present on admission) Hemoglobin 12.1 which appears near patient's baseline. -Continue to monitor   Prediabetes- (present on admission) Last hemoglobin A1c was 6.3.  Patient not on any diabetic medications. -Follow-up repeat hemoglobin A1c   Kidney disease, chronic, stage III (moderate, EGFR 30-59 ml/min) (HCC)- (present on admission) On admission creatinine 1.59 with a BUN 23 which appears near patient's baseline. -Continue to monitor   Dyslipidemia- (present on admission) -Follow-up lipid panel -Continue gemfibrozil -Consider need to add on statin if LDL not less than 70.   Rheumatoid arthritis ([place])- (present on admission) Methotrexate was discontinued after patient developed ulcerative stomatitis for which it appears it was discontinued on 08/07/2020 after being restarted 08/01/2020.   Essential hypertension- (present on admission) Blood pressure was elevated up to 170/68.  He is not on any medications for blood pressure and takes furosemide as needed for fluid. -Allowing for permissive hypertension at this time.          DVT prophylaxis: Lovenox Code Status: Full Family Communication: daughter at bedside Disposition: CIR  Status is: Inpatient Remains inpatient appropriate because: CVA work up           Consultants:  neurology   Subjective:  Patient denies any complaints today, reports I am feeling better today.  Objective: Vitals:   06/30/21 0000 06/30/21 0411 06/30/21 0752 06/30/21 1136  BP: (!) 135/56 (!) 149/56 Marland Kitchen)  137/56 131/64  Pulse: (!) 58 62 61 69  Resp: 18 19 17 16   Temp:  98 F (36.7 C) 97.8 F (36.6 C) 98.3 F (36.8 C)  TempSrc:  Axillary Oral Oral  SpO2: 97% 97% 98% 97%     Intake/Output Summary (Last 24 hours) at 06/30/2021 1354 Last data filed at 06/30/2021 1000 Gross per 24 hour  Intake 75 ml  Output 450 ml  Net -375 ml   There were no vitals filed for this visit.  Examination:  Awake Alert, frail, pleasant interactive and follow commands appropriately Symmetrical Chest wall movement, Good air movement bilaterally, CTAB RRR,No Gallops,Rubs or new Murmurs, No Parasternal Heave +ve B.Sounds, Abd Soft, No tenderness, No rebound - guarding or rigidity. No Cyanosis, Clubbing or edema, No new Rash or bruise      Data Reviewed: I have personally reviewed following labs and imaging studies  CBC: Recent Labs  Lab 06/24/21 1025 06/29/21 0051 06/30/21 0403  WBC 9.2 7.9 8.0  NEUTROABS 6.1 4.4  --   HGB 13.2 12.1* 13.1  HCT 40.9 36.7* 40.6  MCV 91.5 91.8 89.6  PLT 234 237 956    Basic Metabolic Panel: Recent Labs  Lab 06/24/21 0816 06/29/21 0051 06/30/21 0403  NA 138 136 137  K 4.9 3.5 4.0  CL 106 103 103  CO2 19* 25 23  GLUCOSE 97 123* 101*  BUN 20 23 22   CREATININE 1.41* 1.59* 1.53*  CALCIUM 9.3 9.8 9.6    GFR: Estimated Creatinine Clearance: 25.3 mL/min (A) (by C-G formula based on SCr of 1.53 mg/dL (H)).  Liver Function Tests: Recent Labs  Lab 06/24/21 0816 06/29/21 0051  AST 32 20  ALT 12 11  ALKPHOS 65 64  BILITOT 1.3* 0.2*  PROT 7.1 7.0  ALBUMIN 3.3* 3.2*    CBG: No results for input(s): GLUCAP in the last 168 hours.   Recent Results (from the past 240 hour(s))  Resp Panel by RT-PCR (Flu A&B, Covid) Nasopharyngeal Swab     Status: None   Collection Time: 06/24/21  7:34 AM   Specimen: Nasopharyngeal Swab; Nasopharyngeal(NP) swabs in vial transport medium  Result Value Ref Range Status   SARS Coronavirus 2 by RT PCR NEGATIVE NEGATIVE Final    Comment: (NOTE) SARS-CoV-2 target nucleic acids are NOT DETECTED.  The SARS-CoV-2 RNA is generally detectable in upper respiratory specimens during the acute phase of  infection. The lowest concentration of SARS-CoV-2 viral copies this assay can detect is 138 copies/mL. A negative result does not preclude SARS-Cov-2 infection and should not be used as the sole basis for treatment or other patient management decisions. A negative result may occur with  improper specimen collection/handling, submission of specimen other than nasopharyngeal swab, presence of viral mutation(s) within the areas targeted by this assay, and inadequate number of viral copies(<138 copies/mL). A negative result must be combined with clinical observations, patient history, and epidemiological information. The expected result is Negative.  Fact Sheet for Patients:  EntrepreneurPulse.com.au  Fact Sheet for Healthcare Providers:  IncredibleEmployment.be  This test is no t yet approved or cleared by the [place] FDA and  has been authorized for detection and/or diagnosis of SARS-CoV-2 by FDA under an Emergency Use Authorization (EUA). This EUA will remain  in effect (meaning this test can be used) for the duration of the COVID-19 declaration under Section 564(b)(1) of the Act, 21 U.S.C.section 360bbb-3(b)(1), unless the authorization is terminated  or revoked sooner.       Influenza  A by PCR NEGATIVE NEGATIVE Final   Influenza B by PCR NEGATIVE NEGATIVE Final    Comment: (NOTE) The Xpert Xpress SARS-CoV-2/FLU/RSV plus assay is intended as an aid in the diagnosis of influenza from Nasopharyngeal swab specimens and should not be used as a sole basis for treatment. Nasal washings and aspirates are unacceptable for Xpert Xpress SARS-CoV-2/FLU/RSV testing.  Fact Sheet for Patients: EntrepreneurPulse.com.au  Fact Sheet for Healthcare Providers: IncredibleEmployment.be  This test is not yet approved or cleared by the [place] FDA and has been authorized for detection and/or diagnosis of SARS-CoV-2  by FDA under an Emergency Use Authorization (EUA). This EUA will remain in effect (meaning this test can be used) for the duration of the COVID-19 declaration under Section 564(b)(1) of the Act, 21 U.S.C. section 360bbb-3(b)(1), unless the authorization is terminated or revoked.  Performed at [facility], [place] [address], [place], [place] [address]          Radiology Studies: DG Chest 1 View  Result Date: 06/29/2021 CLINICAL DATA:  Fall EXAM: CHEST  1 VIEW COMPARISON:  06/09/2021 FINDINGS: Heart and mediastinal contours are within normal limits. No focal opacities or effusions. No acute bony abnormality. Aortic atherosclerosis. IMPRESSION: No active cardiopulmonary disease. Electronically Signed   By: Rolm Baptise M.D.   On: 06/29/2021 01:06   CT Head Wo Contrast  Result Date: 06/29/2021 CLINICAL DATA:  Head trauma, minor (Age >= 65y).  Fall. EXAM: CT HEAD WITHOUT CONTRAST TECHNIQUE: Contiguous axial images were obtained from the base of the skull through the vertex without intravenous contrast. RADIATION DOSE REDUCTION: This exam was performed according to the departmental dose-optimization program which includes automated exposure control, adjustment of the mA and/or kV according to patient size and/or use of iterative reconstruction technique. COMPARISON:  06/24/2021 FINDINGS: Brain: There is atrophy and chronic small vessel disease changes. No acute intracranial abnormality. Specifically, no hemorrhage, hydrocephalus, mass lesion, acute infarction, or significant intracranial injury. Multiple bilateral basal ganglia and corona radiata lacunar infarcts. Old bilateral cerebellar infarcts. Vascular: No hyperdense vessel or unexpected calcification. Skull: No acute calvarial abnormality. Sinuses/Orbits: No acute findings Other: None IMPRESSION: No acute intracranial abnormality. Electronically Signed   By: Rolm Baptise M.D.   On: 06/29/2021 02:11   CT Cervical Spine Wo Contrast  Result  Date: 06/29/2021 CLINICAL DATA:  Neck trauma (Age >= 65y).  Fall. EXAM: CT CERVICAL SPINE WITHOUT CONTRAST TECHNIQUE: Multidetector CT imaging of the cervical spine was performed without intravenous contrast. Multiplanar CT image reconstructions were also generated. RADIATION DOSE REDUCTION: This exam was performed according to the departmental dose-optimization program which includes automated exposure control, adjustment of the mA and/or kV according to patient size and/or use of iterative reconstruction technique. COMPARISON:  03/09/2019 FINDINGS: Alignment: No subluxation. Skull base and vertebrae: No acute fracture. No primary bone lesion or focal pathologic process. Soft tissues and spinal canal: No prevertebral fluid or swelling. No visible canal hematoma. Disc levels: Diffuse advanced degenerative disc disease and facet disease. Upper chest: No acute findings Other: None IMPRESSION: Advanced diffuse degenerative disc and facet disease. No acute bony abnormality. Electronically Signed   By: Rolm Baptise M.D.   On: 06/29/2021 02:13   MR ANGIO HEAD WO CONTRAST  Result Date: 06/29/2021 CLINICAL DATA:  Neuro deficit with acute stroke suspected EXAM: MRI HEAD WITHOUT CONTRAST MRA HEAD WITHOUT CONTRAST MRA OF THE NECK WITHOUT AND WITH CONTRAST TECHNIQUE: Multiplanar, multi-echo pulse sequences of the brain and surrounding structures were acquired without intravenous contrast.  Angiographic images of the Circle of Willis were acquired using MRA technique without intravenous contrast. Angiographic images of the neck were acquired using MRA technique without and with intravenous contrast. Carotid stenosis measurements (when applicable) are obtained utilizing NASCET criteria, using the distal internal carotid diameter as the denominator. CONTRAST:  Reference EMR. COMPARISON:  Head CT from earlier today. CTA of the head neck 07/09/2018 FINDINGS: MR HEAD FINDINGS Brain: Band of restricted diffusion at the right corona  radiata. Background of chronic small vessel ischemia with confluent gliosis in the periventricular white matter. Scattered chronic lacunes especially in the left frontal region. Small remote bilateral cerebellar infarcts which are numerous. Generalized brain atrophy. No acute hemorrhage, hydrocephalus, or masslike finding. Chronic right thalamic lacunar infarct. Vascular: Normal flow voids. Skull and upper cervical spine: Normal marrow signal Sinuses/Orbits: Bilateral cataract resection. MRA HEAD FINDINGS Anterior circulation: Atheromatous irregularity of intracranial vessels. High-grade right M1 segment stenosis just before the bifurcation. Moderate narrowing at the right A1 origin. The right A1 segment is non dominant. Posterior circulation: No flow seen in the covered right V4 segment. Extensive atheromatous irregularity of the left vertebral artery, so diffusely diseased that accurate measurement of stenosis is complicated. There is an at least 60% narrowing at the distal V4 segment when compared with the more normal diameter proximal V4 segment. Extensive atheromatous irregularity of the basilar. The degree of vertebral and basilar stenosis actually appears improved from prior CTA, especially at the distal V4 segment. Rightward outpouching of the mid basilar is likely atheromatous. No suspected aneurysm. Fetal type left PCA. Symmetric flow in the atheromatous posterior cerebral arteries. Anatomic variants:As above MRA NECK FINDINGS Aortic arch: Normal diameter with 3 vessel branching. Right carotid system: Vessels are smooth and diffusely patent. Mild plaque like changes at the bifurcation. Left carotid system: Vessels are smooth and diffusely patent. Vertebral arteries: No proximal subclavian stenosis. Non dominant right vertebral artery with flow seen until the V3 segment. Chronic distal right vertebral occlusion by prior CTA. Left vertebral origin stenosis of approximately 50%, non progressed from 2020.  Other: None. IMPRESSION: Brain MRI: 1. Small acute perforator infarct at the right corona radiata. 2. Extensive chronic small vessel ischemia. Chronic small vessel infarcts in the cerebellum and subcortical brain. 3. Brain atrophy. Intracranial MRA: 1. Advanced and diffuse atherosclerosis. 2. Advanced right M1 segment stenosis that is new from a 2020 CTA. 3. 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 Chronic high-grade narrowing in the left V4 segment and basilar, non progressed from 2020. Neck MRA: 1. 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 2. 50% narrowing at the left vertebral origin, chronic when compared to 2020 CTA. 3. No stenosis of the cervical common and internal carotids. Electronically Signed   By: Jorje Guild M.D.   On: 06/29/2021 07:16   MR Angiogram Neck W or Wo Contrast  Result Date: 06/29/2021 CLINICAL DATA:  Neuro deficit with acute stroke suspected EXAM: MRI HEAD WITHOUT CONTRAST MRA HEAD WITHOUT CONTRAST MRA OF THE NECK WITHOUT AND WITH CONTRAST TECHNIQUE: Multiplanar, multi-echo pulse sequences of the brain and surrounding structures were acquired without intravenous contrast. Angiographic images of the Circle of Willis were acquired using MRA technique without intravenous contrast. Angiographic images of the neck were acquired using MRA technique without and with intravenous contrast. Carotid stenosis measurements (when applicable) are obtained utilizing NASCET criteria, using the distal internal carotid diameter as the denominator. CONTRAST:  Reference EMR. COMPARISON:  Head CT from earlier today. CTA of the head neck 07/09/2018 FINDINGS:  MR HEAD FINDINGS Brain: Band of restricted diffusion at the right corona radiata. Background of chronic small vessel ischemia with confluent gliosis in the periventricular white matter. Scattered chronic lacunes especially in the left frontal region. Small remote bilateral cerebellar infarcts which are numerous. Generalized brain  atrophy. No acute hemorrhage, hydrocephalus, or masslike finding. Chronic right thalamic lacunar infarct. Vascular: Normal flow voids. Skull and upper cervical spine: Normal marrow signal Sinuses/Orbits: Bilateral cataract resection. MRA HEAD FINDINGS Anterior circulation: Atheromatous irregularity of intracranial vessels. High-grade right M1 segment stenosis just before the bifurcation. Moderate narrowing at the right A1 origin. The right A1 segment is non dominant. Posterior circulation: No flow seen in the covered right V4 segment. Extensive atheromatous irregularity of the left vertebral artery, so diffusely diseased that accurate measurement of stenosis is complicated. There is an at least 60% narrowing at the distal V4 segment when compared with the more normal diameter proximal V4 segment. Extensive atheromatous irregularity of the basilar. The degree of vertebral and basilar stenosis actually appears improved from prior CTA, especially at the distal V4 segment. Rightward outpouching of the mid basilar is likely atheromatous. No suspected aneurysm. Fetal type left PCA. Symmetric flow in the atheromatous posterior cerebral arteries. Anatomic variants:As above MRA NECK FINDINGS Aortic arch: Normal diameter with 3 vessel branching. Right carotid system: Vessels are smooth and diffusely patent. Mild plaque like changes at the bifurcation. Left carotid system: Vessels are smooth and diffusely patent. Vertebral arteries: No proximal subclavian stenosis. Non dominant right vertebral artery with flow seen until the V3 segment. Chronic distal right vertebral occlusion by prior CTA. Left vertebral origin stenosis of approximately 50%, non progressed from 2020. Other: None. IMPRESSION: Brain MRI: 1. Small acute perforator infarct at the right corona radiata. 2. Extensive chronic small vessel ischemia. Chronic small vessel infarcts in the cerebellum and subcortical brain. 3. Brain atrophy. Intracranial MRA: 1. Advanced  and diffuse atherosclerosis. 2. Advanced right M1 segment stenosis that is new from a 2020 CTA. 3. 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 Chronic high-grade narrowing in the left V4 segment and basilar, non progressed from 2020. Neck MRA: 1. 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 2. 50% narrowing at the left vertebral origin, chronic when compared to 2020 CTA. 3. No stenosis of the cervical common and internal carotids. Electronically Signed   By: Jorje Guild M.D.   On: 06/29/2021 07:16   MR BRAIN WO CONTRAST  Result Date: 06/29/2021 CLINICAL DATA:  Neuro deficit with acute stroke suspected EXAM: MRI HEAD WITHOUT CONTRAST MRA HEAD WITHOUT CONTRAST MRA OF THE NECK WITHOUT AND WITH CONTRAST TECHNIQUE: Multiplanar, multi-echo pulse sequences of the brain and surrounding structures were acquired without intravenous contrast. Angiographic images of the Circle of Willis were acquired using MRA technique without intravenous contrast. Angiographic images of the neck were acquired using MRA technique without and with intravenous contrast. Carotid stenosis measurements (when applicable) are obtained utilizing NASCET criteria, using the distal internal carotid diameter as the denominator. CONTRAST:  Reference EMR. COMPARISON:  Head CT from earlier today. CTA of the head neck 07/09/2018 FINDINGS: MR HEAD FINDINGS Brain: Band of restricted diffusion at the right corona radiata. Background of chronic small vessel ischemia with confluent gliosis in the periventricular white matter. Scattered chronic lacunes especially in the left frontal region. Small remote bilateral cerebellar infarcts which are numerous. Generalized brain atrophy. No acute hemorrhage, hydrocephalus, or masslike finding. Chronic right thalamic lacunar infarct. Vascular: Normal flow voids. Skull and upper cervical spine: Normal marrow  signal Sinuses/Orbits: Bilateral cataract resection. MRA HEAD FINDINGS Anterior circulation:  Atheromatous irregularity of intracranial vessels. High-grade right M1 segment stenosis just before the bifurcation. Moderate narrowing at the right A1 origin. The right A1 segment is non dominant. Posterior circulation: No flow seen in the covered right V4 segment. Extensive atheromatous irregularity of the left vertebral artery, so diffusely diseased that accurate measurement of stenosis is complicated. There is an at least 60% narrowing at the distal V4 segment when compared with the more normal diameter proximal V4 segment. Extensive atheromatous irregularity of the basilar. The degree of vertebral and basilar stenosis actually appears improved from prior CTA, especially at the distal V4 segment. Rightward outpouching of the mid basilar is likely atheromatous. No suspected aneurysm. Fetal type left PCA. Symmetric flow in the atheromatous posterior cerebral arteries. Anatomic variants:As above MRA NECK FINDINGS Aortic arch: Normal diameter with 3 vessel branching. Right carotid system: Vessels are smooth and diffusely patent. Mild plaque like changes at the bifurcation. Left carotid system: Vessels are smooth and diffusely patent. Vertebral arteries: No proximal subclavian stenosis. Non dominant right vertebral artery with flow seen until the V3 segment. Chronic distal right vertebral occlusion by prior CTA. Left vertebral origin stenosis of approximately 50%, non progressed from 2020. Other: None. IMPRESSION: Brain MRI: 1. Small acute perforator infarct at the right corona radiata. 2. Extensive chronic small vessel ischemia. Chronic small vessel infarcts in the cerebellum and subcortical brain. 3. Brain atrophy. Intracranial MRA: 1. Advanced and diffuse atherosclerosis. 2. Advanced right M1 segment stenosis that is new from a 2020 CTA. 3. 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 Chronic high-grade narrowing in the left V4 segment and basilar, non progressed from 2020. Neck MRA: 1. Chronic occlusion of the  distal right vertebral artery. 2. 50% narrowing at the left vertebral origin, chronic when compared to 2020 CTA. 3. No stenosis of the cervical common and internal carotids. Electronically Signed   By: Jorje Guild M.D.   On: 06/29/2021 07:16   DG Shoulder Left  Result Date: 06/29/2021 CLINICAL DATA:  Fall EXAM: LEFT SHOULDER - 2+ VIEW COMPARISON:  None. FINDINGS: Degenerative changes in the [facility] joint with joint space narrowing and spurring. Glenohumeral joint is maintained. No acute bony abnormality. Specifically, no fracture, subluxation, or dislocation. Soft tissues are intact. IMPRESSION: Degenerative changes in the left AC joint. No acute bony abnormality. Electronically Signed   By: Rolm Baptise M.D.   On: 06/29/2021 00:57   DG Humerus Left  Result Date: 06/29/2021 CLINICAL DATA:  Fall EXAM: LEFT HUMERUS - 2+ VIEW COMPARISON:  None. FINDINGS: There is no evidence of fracture or other focal bone lesions. Soft tissues are unremarkable. Degenerative changes in the left AC joint. IMPRESSION: No acute bony abnormality. Electronically Signed   By: Rolm Baptise M.D.   On: 06/29/2021 00:58   ECHOCARDIOGRAM COMPLETE  Result Date: 06/29/2021    ECHOCARDIOGRAM REPORT   Patient Name:   [name] Date of Exam: 06/29/2021 Medical Rec #:  [id number]  Height:       63.0 in Accession #:    [id number] Weight:       130.1 lb Date of Birth:  [birth year]  BSA:          1.611 m Patient Age:    6 years   BP:           129/84 mmHg Patient Gender: M          HR:  57 bpm. Exam Location:  Inpatient Procedure: 2D Echo, Cardiac Doppler and Color Doppler Indications:    Stroke  History:        Patient has no prior history of Echocardiogram examinations.                 Risk Factors:Hypertension.  Sonographer:    Jyl Heinz Referring Phys: [id number] RONDELL A SMITH IMPRESSIONS  1. Left ventricular ejection fraction, by estimation, is 60 to 65%. The left ventricle has normal function. The left ventricle has no regional wall  motion abnormalities. Left ventricular diastolic parameters are consistent with Grade I diastolic dysfunction (impaired relaxation).  2. Right ventricular systolic function is normal. The right ventricular size is normal. Tricuspid regurgitation signal is inadequate for assessing PA pressure.  3. The mitral valve is grossly normal. Trivial mitral valve regurgitation.  4. The aortic valve is grossly normal. Aortic valve regurgitation is mild.  5. The inferior vena cava is normal in size with greater than 50% respiratory variability, suggesting right atrial pressure of 3 mmHg.  6. Cannot exclude a small PFO. Comparison(s): No prior Echocardiogram. FINDINGS  Left Ventricle: Left ventricular ejection fraction, by estimation, is 60 to 65%. The left ventricle has normal function. The left ventricle has no regional wall motion abnormalities. The left ventricular internal cavity size was normal in size. There is  no left ventricular hypertrophy. Left ventricular diastolic parameters are consistent with Grade I diastolic dysfunction (impaired relaxation). Right Ventricle: The right ventricular size is normal. No increase in right ventricular wall thickness. Right ventricular systolic function is normal. Tricuspid regurgitation signal is inadequate for assessing PA pressure. Left Atrium: Left atrial size was normal in size. Right Atrium: Right atrial size was normal in size. Pericardium: Trivial pericardial effusion is present. Mitral Valve: The mitral valve is grossly normal. Mild mitral annular calcification. Trivial mitral valve regurgitation. Tricuspid Valve: The tricuspid valve is grossly normal. Tricuspid valve regurgitation is not demonstrated. Aortic Valve: The aortic valve is grossly normal. Aortic valve regurgitation is mild. Aortic regurgitation PHT measures 777 msec. Aortic valve peak gradient measures 10.6 mmHg. Pulmonic Valve: The pulmonic valve was not well visualized. Pulmonic valve regurgitation is not  visualized. Aorta: The aortic root and ascending aorta are structurally normal, with no evidence of dilitation. Venous: The inferior vena cava is normal in size with greater than 50% respiratory variability, suggesting right atrial pressure of 3 mmHg. IAS/Shunts: Cannot exclude a small PFO.  LEFT VENTRICLE PLAX 2D LVIDd:         3.80 cm     Diastology LVIDs:         2.70 cm     LV e' medial:    5.11 cm/s LV PW:         1.10 cm     LV E/e' medial:  15.9 LV IVS:        1.00 cm     LV e' lateral:   7.18 cm/s LVOT diam:     1.80 cm     LV E/e' lateral: 11.3 LV SV:         58 LV SV Index:   36 LVOT Area:     2.54 cm  LV Volumes (MOD) LV vol d, MOD A2C: 72.6 ml LV vol d, MOD A4C: 74.1 ml LV vol s, MOD A2C: 25.4 ml LV vol s, MOD A4C: 27.4 ml LV SV MOD A2C:     47.2 ml LV SV MOD A4C:     74.1 ml LV  SV MOD BP:      45.8 ml RIGHT VENTRICLE             IVC RV Basal diam:  3.50 cm     IVC diam: 1.00 cm RV Mid diam:    2.30 cm RV S prime:     11.90 cm/s TAPSE (M-mode): 2.4 cm LEFT ATRIUM             Index        RIGHT ATRIUM           Index LA diam:        2.90 cm 1.80 cm/m   RA Area:     10.90 cm LA Vol (A2C):   32.7 ml 20.30 ml/m  RA Volume:   21.00 ml  13.04 ml/m LA Vol (A4C):   36.2 ml 22.48 ml/m LA Biplane Vol: 35.4 ml 21.98 ml/m  AORTIC VALVE AV Area (Vmax): 1.41 cm AV Vmax:        163.00 cm/s AV Peak Grad:   10.6 mmHg LVOT Vmax:      90.10 cm/s LVOT Vmean:     70.500 cm/s LVOT VTI:       0.227 m AI PHT:         777 msec  AORTA Ao Root diam: 2.90 cm Ao Asc diam:  2.80 cm MITRAL VALVE MV Area (PHT): 3.12 cm     SHUNTS MV Decel Time: 243 msec     Systemic VTI:  0.23 m MV E velocity: 81.00 cm/s   Systemic Diam: 1.80 cm MV A velocity: 100.00 cm/s MV E/A ratio:  0.81 Mary Scientist, physiological signed by Phineas Inches Signature Date/Time: 06/29/2021/1:24:13 PM    Final    DG Hip Unilat W or Wo Pelvis 2-3 Views Right  Result Date: 06/29/2021 CLINICAL DATA:  Fall EXAM: DG HIP (WITH OR WITHOUT PELVIS) 2-3V RIGHT COMPARISON:   None. FINDINGS: Hip joints and SI joints are symmetric. No acute bony abnormality. Specifically, no fracture, subluxation, or dislocation. IMPRESSION: No acute bony abnormality. Electronically Signed   By: Rolm Baptise M.D.   On: 06/29/2021 00:58        Scheduled Meds:   stroke: mapping our early stages of recovery book   Does not apply Once   aspirin EC  81 mg Oral Daily   docusate sodium  200 mg Oral Daily   enoxaparin (LOVENOX) injection  30 mg Subcutaneous Q24H   gemfibrozil  600 mg Oral QPM   levothyroxine  100 mcg Oral QAC breakfast   mirabegron ER  25 mg Oral QHS   Continuous Infusions:   LOS: 1 day        Phillips Climes, MD [facility]   To contact the attending provider between 7A-7P or the covering provider during after hours 7P-7A, please log into the web site [email or web address] and access using universal  password for that web site. If you do not have the password, please call the hospital operator.  06/30/2021, 1:54 PM

## 2021-06-30 NOTE — Progress Notes (Signed)
? ?  Inpatient Rehab Admissions Coordinator : ? ?Per therapy recommendations, patient was screened for CIR candidacy by Kristell Wooding RN MSN.  At this time patient appears to be a potential candidate for CIR. I will place a rehab consult per protocol for full assessment. Please call me with any questions. ? ?Nardos Putnam RN MSN ?Admissions Coordinator ?[phone] ?  ?

## 2021-06-30 NOTE — Evaluation (Signed)
Physical Therapy Evaluation Patient Details Name: [name] MRN: [id number] DOB: 1928/06/09 Today's Date: 06/30/2021  History of Present Illness  88 y.o. male presented 06/29/21 after having a fall. 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 PTA patient was living with spouse in [place], supposed to use a RW (however per daughter, often did not use), and having multiple falls per month (>10x/month).  Pt currently requires +2 max assist for standing due to posterior bias that increases with attempts to shift him over his base of support.  Anticipate patient will benefit from PT to address problems listed below. Will continue to follow acutely to maximize functional mobility independence and safety.          Recommendations for follow up therapy are one component of a multi-disciplinary discharge planning process, led by the attending physician.  Recommendations may be updated based on patient status, additional functional criteria and insurance authorization.  Follow Up Recommendations Acute inpatient rehab (3hours/day)    Assistance Recommended at Discharge Frequent or constant Supervision/Assistance  Patient can return home with the following  Two people to help with walking and/or transfers;Two people to help with bathing/dressing/bathroom;Assistance with cooking/housework;Assistance with feeding;Direct supervision/assist for medications management;Direct supervision/assist for financial management;Assist for transportation    Equipment Recommendations None recommended by PT  Recommendations for Other Services  Rehab consult    Functional Status Assessment Patient has had a recent decline in their functional status and demonstrates the  ability to make significant improvements in function in a reasonable and predictable amount of time.     Precautions / Restrictions Precautions Precautions: Fall Precaution Comments: has fallen >10x in past month (per daughter); sometimes 2x/day      Mobility  Bed Mobility               General bed mobility comments: up in recliner on arrival    Transfers Overall transfer level: Needs assistance Equipment used: 2 person hand held assist Transfers: Sit to/from Stand Sit to Stand: Max assist, +2 physical assistance           General transfer comment: strong posterior bias that increases with attempts to weight shift him forward over his feet    Ambulation/Gait             Pre-gait activities: attempted marching with posterior bias increasing and returned to sitting General Gait Details: unsafe to attempt due to strong posterior bias  Stairs            Wheelchair Mobility    Modified Rankin (Stroke Patients Only) Modified Rankin (Stroke Patients Only) Pre-Morbid Rankin Score: No significant disability Modified Rankin: Severe disability     Balance Overall balance assessment: Needs assistance Sitting-balance support: No upper extremity supported, Feet unsupported Sitting balance-Leahy Scale: Poor Sitting balance - Comments: posterior imbalance Postural control: Posterior lean Standing balance support: Bilateral upper extremity supported Standing balance-Leahy Scale: Zero                               Pertinent Vitals/Pain Pain Assessment Pain Assessment: Faces Faces Pain Scale: Hurts a little bit Pain Location: left shoulder Pain Descriptors / Indicators: Guarding Pain Intervention(s): Limited activity within patient's tolerance, Monitored during session    Home  Living Family/patient expects to be discharged to:: Other (Comment) (ILF) Living Arrangements: Spouse/significant other Available Help at Discharge: Family;Available 24  hours/day;Personal care attendant (since CVA, caregiver/family come during day and stay at night for supervision/safety, med mgt) Type of Home: House Home Access: Level entry       Home Layout: One level Home Equipment: Conservation officer, nature (2 wheels);Rollator (4 wheels);Grab bars - tub/shower Additional Comments: daughter reoprts that pt has had 10+ falls    Prior Function Prior Level of Function : Needs assist       Physical Assist : ADLs (physical)   ADLs (physical): Dressing Mobility Comments: has cane and RW but does not always use AD ADLs Comments: needed assist with shoes     Hand Dominance   Dominant Hand: Right    Extremity/Trunk Assessment   Upper Extremity Assessment Upper Extremity Assessment: Defer to OT evaluation    Lower Extremity Assessment Lower Extremity Assessment: Overall WFL for tasks assessed (5/5 quads)    Cervical / Trunk Assessment Cervical / Trunk Assessment: Other exceptions Cervical / Trunk Exceptions: posterior bias  Communication   Communication: No difficulties (speaks and understands [place])  Cognition Arousal/Alertness: Awake/alert Behavior During Therapy: WFL for tasks assessed/performed Overall Cognitive Status: History of cognitive impairments - at baseline                                 General Comments: daughter reprts that PTA he would have moments of confusion        General Comments General comments (skin integrity, edema, etc.): Daughter, [name], present during evaluation    Exercises     Assessment/Plan    PT Assessment Patient needs continued PT services  PT Problem List Decreased balance;Decreased mobility;Decreased coordination;Decreased cognition;Decreased knowledge of use of DME;Decreased safety awareness;Decreased knowledge of precautions       PT Treatment Interventions DME instruction;Gait training;Functional mobility training;Therapeutic activities;Therapeutic exercise;Balance  training;Neuromuscular re-education;Cognitive remediation;Patient/family education    PT Goals (Current goals can be found in the Care Plan section)  Acute Rehab PT Goals Patient Stated Goal: to walk again PT Goal Formulation: With patient Time For Goal Achievement: 07/14/21 Potential to Achieve Goals: Good    Frequency Min 4X/week     Co-evaluation               AM-PAC PT "6 Clicks" Mobility  Outcome Measure Help needed turning from your back to your side while in a flat bed without using bedrails?: A Lot Help needed moving from lying on your back to sitting on the side of a flat bed without using bedrails?: A Lot Help needed moving to and from a bed to a chair (including a wheelchair)?: Total Help needed standing up from a chair using your arms (e.g., wheelchair or bedside chair)?: Total Help needed to walk in hospital room?: Total Help needed climbing 3-5 steps with a railing? : Total 6 Click Score: 8    End of Session Equipment Utilized During Treatment: Gait belt Activity Tolerance: Patient tolerated treatment well Patient left: in chair;with call bell/phone within reach;with chair alarm set;with family/visitor present Nurse Communication: Mobility status (2 person assist) PT Visit Diagnosis: Unsteadiness on feet (R26.81);History of falling (Z91.81);Difficulty in walking, not elsewhere classified (R26.2)    Time: 4431-5400 PT Time Calculation (min) (ACUTE ONLY): 29 min   Charges:   PT Evaluation $PT Eval Moderate Complexity: 1 Mod           Jeani Hawking  Chauncey Cruel, PT Acute Rehabilitation Services  Pager [phone] Office [phone]   Rexanne Mano 06/30/2021, 11:44 AM

## 2021-06-30 NOTE — Consult Note (Signed)
Neurology Consultation  Reason for Consult: Stroke on MRI Referring Physician: Dr. Waldron Labs  CC: Left-sided weakness  History is obtained from: Patient, Patient's daughter at bedside, Chart review  HPI: [name] is a 88 y.o. male with medical history significant for essential hypertension, hyperlipidemia, CVA, CKD stage III, hypothyroidism, colon cancer s/p hemicolectomy with chemotherapy, and rheumatoid arthritis who presented to the ED on 2/7 for evaluation of a fall at his assisted living facility.  Patient was evaluated multiple times over the past week in the emergency department for frequent falls and was discharged back to his facility with concerns about patient living in independent living and needing skilled nursing care.  After patient was discharged on 2/3, patient's daughter noted that he was dragging his left foot and was unable to get his walker with his left hand.  Patient fell out of bed injuring his left side on the morning of 2/7 prompting further evaluation in the ED.  MRI imaging revealed a small acute perforator infarct at the right corona radiata with advanced diffuse atherosclerosis and advanced right M1 segment stenosis on vessel imaging and neurology was consulted for further evaluation.  LKW: " Several weeks ago" patient began to have frequent falls, left-sided weakness was noted 4-5 days ago per patient's daughter TNK given?: no, patient presented well outside of thrombolytic therapy with IR Thrombectomy? No, no LVO identified on vessel imaging Modified Rankin Scale: 3-Moderate disability-requires help but walks WITHOUT assistance  ROS: A complete ROS was performed and is negative except as noted in the HPI.   Past Medical History:  Diagnosis Date   Adenocarcinoma ([place]) 1985   colon s/p right hemicolectomy and chemotherapy. F/U with cancer center and Dr. Lucia Gaskins rotinely   Depression    Eczema    Epistaxis    f/u per ENT   Erectile dysfunction    Gastric ulcer  12/11   H-Pylori Tx, EGD 3-12: gastritis   Hay fever    Head injury 11/2017   with fall   Hyperlipemia    Hypertension    Hypothyroidism    Hypothyroid   Insomnia    Osteopenia     per DEXA 07-2008 (Rx fosamax)   Rheumatoid arthritis ([place])    Stroke ([place])     " balance issue"   Past Surgical History:  Procedure Laterality Date   COLON SURGERY     Right / Adenocarcinoma / Chemo   COLONOSCOPY  2005, 2008   Dr. Lucia Gaskins   INGUINAL HERNIA REPAIR     Right   IR ANGIO INTRA EXTRACRAN SEL COM CAROTID INNOMINATE BILAT MOD SED  12/13/2017   IR ANGIO VERTEBRAL SEL VERTEBRAL BILAT MOD SED  12/13/2017   IR PTA INTRACRANIAL  12/18/2017   RADIOLOGY WITH ANESTHESIA N/A 12/18/2017   Procedure: Angioplasty with stenting;  Surgeon: Luanne Bras, MD;  Location: [facility];  Service: Radiology;  Laterality: N/A;   Family History  Problem Relation Age of Onset   Diabetes Neg Hx    Heart attack Neg Hx    Colon cancer Neg Hx    Prostate cancer Neg Hx    Social History:   reports that he has never smoked. He has never used smokeless tobacco. He reports that he does not drink alcohol and does not use drugs.  Medications  Current Facility-Administered Medications:     stroke: mapping our early stages of recovery book, , Does not apply, Once, Smith, Rondell A, MD   acetaminophen (TYLENOL) tablet 650 mg, 650 mg, Oral,  Q4H PRN **OR** acetaminophen (TYLENOL) 160 MG/5ML solution 650 mg, 650 mg, Per Tube, Q4H PRN **OR** acetaminophen (TYLENOL) suppository 650 mg, 650 mg, Rectal, Q4H PRN, Tamala Julian, Rondell A, MD   aspirin EC tablet 81 mg, 81 mg, Oral, Daily, Tamala Julian, Rondell A, MD, 81 mg at 06/30/21 0841   docusate sodium (COLACE) capsule 200 mg, 200 mg, Oral, Daily, Tamala Julian, Rondell A, MD, 200 mg at 06/30/21 0841   enoxaparin (LOVENOX) injection 30 mg, 30 mg, Subcutaneous, Q24H, Smith, Rondell A, MD, 30 mg at 06/30/21 5284   gemfibrozil (LOPID) tablet 600 mg, 600 mg, Oral, QPM, Smith, Rondell A, MD, 600 mg at  06/29/21 1847   guaiFENesin (MUCINEX) 12 hr tablet 600 mg, 600 mg, Oral, BID PRN, Fuller Plan A, MD   levothyroxine (SYNTHROID) tablet 100 mcg, 100 mcg, Oral, QAC breakfast, Fuller Plan A, MD, 100 mcg at 06/30/21 0538   loratadine (CLARITIN) tablet 10 mg, 10 mg, Oral, Daily PRN, Tamala Julian, Rondell A, MD   LORazepam (ATIVAN) injection 1 mg, 1 mg, Intravenous, Once PRN, Norval Morton, MD   mirabegron ER (MYRBETRIQ) tablet 25 mg, 25 mg, Oral, QHS, Smith, Rondell A, MD, 25 mg at 06/29/21 2215   polyvinyl alcohol (LIQUIFILM TEARS) 1.4 % ophthalmic solution 1 drop, 1 drop, Both Eyes, Daily PRN, Smith, Rondell A, MD   senna-docusate (Senokot-S) tablet 1 tablet, 1 tablet, Oral, QHS PRN, Norval Morton, MD  Exam: Current vital signs: BP 131/64 (BP Location: Right Arm)    Pulse 69    Temp 98.3 F (36.8 C) (Oral)    Resp 16    SpO2 97%  Vital signs in last 24 hours: Temp:  [97.4 F (36.3 C)-98.3 F (36.8 C)] 98.3 F (36.8 C) (02/09 1136) Pulse Rate:  [54-69] 69 (02/09 1136) Resp:  [13-20] 16 (02/09 1136) BP: (119-156)/(46-64) 131/64 (02/09 1136) SpO2:  [96 %-100 %] 97 % (02/09 1136)  GENERAL: Sitting up comfortably at bedside recliner, in no acute distress Psych: Affect appropriate for situation, patient is calm and cooperative with examination Head: Normocephalic and atraumatic, without obvious abnormality EENT: Normal conjunctivae, dry mucous membranes, no OP obstruction LUNGS: Normal respiratory effort. Non-labored breathing on room air CV: Regular rate and rhythm on telemetry ABDOMEN: Soft, non-tender, non-distended Extremities: Warm, well perfused, without obvious deformity  NEURO:  Mental Status: Awake, alert, and oriented to person, place, time, and situation. He is able to provide some details regarding his present illness however his daughter at bedside provides most details.  Patient is able to answer some questions in [place] however he has nonfluent in [place] and requires a  [name]. Speech/Language: speech is intact without dysarthria per daughter at bedside. Patient does not have any evidence of word finding difficulties on examination, however, patient's daughter states that he has had some word finding difficulties and normal conversation with her over the past few days. No neglect is noted Cranial Nerves:  II: PERRL 4 mm/brisk. Visual fields full.  III, IV, VI: EOMI without ptosis, nystagmus, or gaze preference V: Sensation is intact to light touch and symmetrical to face. VII: Face is asymmetric with left-sided mouth droop VIII: Hearing is intact to voice IX, X: Palate elevation is symmetric. Phonation normal.  XI: Normal sternocleidomastoid and trapezius muscle strength XII: Tongue protrudes midline without fasciculations.   Motor: 5/5 strength present in right upper and lower extremity. Left upper extremity weakness is present.  He is able to elevate his left lower extremity antigravity slightly (does endorse left shoulder  pain with passive elevation of left upper extremity) but drifts to bed. Left lower extremity minimally weak with minimal drift on assessment. Tone is normal. Bulk is normal.  Sensation: Intact to light touch bilaterally in all four extremities. No extinction to DSS present.  Coordination: FTN intact bilaterally. HKS intact bilaterally. No pronator drift.  Gait: Deferred for patient's safety with reports of multiple recent falls  NIHSS: 1a Level of Conscious.: 0 1b LOC Questions: 0 1c LOC Commands: 0 2 Best Gaze: 0 3 Visual: 0 4 Facial Palsy: 1 5a Motor Arm - left: 2 5b Motor Arm - Right: 0 6a Motor Leg - Left: 1 6b Motor Leg - Right: 0 7 Limb Ataxia: 0 8 Sensory: 0 9 Best Language: 0 10 Dysarthria: 0 11 Extinct. and Inatten.: 0 TOTAL: 4  Labs I have reviewed labs in epic and the results pertinent to this consultation are: CBC    Component Value Date/Time   WBC 8.0 06/30/2021 0403   RBC 4.53 06/30/2021  0403   HGB 13.1 06/30/2021 0403   HGB 13.2 08/06/2020 1552   HCT 40.6 06/30/2021 0403   HCT 39.0 08/06/2020 1552   PLT 249 06/30/2021 0403   PLT 200 08/06/2020 1552   MCV 89.6 06/30/2021 0403   MCV 90 08/06/2020 1552   MCH 28.9 06/30/2021 0403   MCHC 32.3 06/30/2021 0403   RDW 12.8 06/30/2021 0403   RDW 12.1 08/06/2020 1552   LYMPHSABS 2.0 06/29/2021 0051   LYMPHSABS 2.6 08/06/2020 1552   MONOABS 0.9 06/29/2021 0051   EOSABS 0.5 06/29/2021 0051   EOSABS 0.1 08/06/2020 1552   BASOSABS 0.0 06/29/2021 0051   BASOSABS 0.0 08/06/2020 1552   CMP     Component Value Date/Time   NA 137 06/30/2021 0403   NA 139 08/06/2020 1552   K 4.0 06/30/2021 0403   CL 103 06/30/2021 0403   CO2 23 06/30/2021 0403   GLUCOSE 101 (H) 06/30/2021 0403   BUN 22 06/30/2021 0403   BUN 28 08/06/2020 1552   CREATININE 1.53 (H) 06/30/2021 0403   CALCIUM 9.6 06/30/2021 0403   PROT 7.0 06/29/2021 0051   PROT 7.6 08/06/2020 1552   ALBUMIN 3.2 (L) 06/29/2021 0051   ALBUMIN 4.5 08/06/2020 1552   AST 20 06/29/2021 0051   ALT 11 06/29/2021 0051   ALKPHOS 64 06/29/2021 0051   BILITOT 0.2 (L) 06/29/2021 0051   BILITOT 0.5 08/06/2020 1552   GFRNONAA 43 (L) 06/30/2021 0403   GFRAA 60 (L) 03/10/2019 0330   Lipid Panel     Component Value Date/Time   CHOL 173 06/29/2021 2154   TRIG 170 (H) 06/29/2021 2154   HDL 37 (L) 06/29/2021 2154   CHOLHDL 4.7 06/29/2021 2154   VLDL 34 06/29/2021 2154   LDLCALC 102 (H) 06/29/2021 2154   LDLDIRECT 74.9 04/11/2011 0933   Lab Results  Component Value Date   HGBA1C 5.8 (H) 06/29/2021   Imaging I have reviewed the images obtained:  Brain MRI 2/8: 1. Small acute perforator infarct at the right corona radiata. 2. Extensive chronic small vessel ischemia. Chronic small vessel infarcts in the cerebellum and subcortical brain. 3. Brain atrophy.   Intracranial MRA 2/8: 1. Advanced and diffuse atherosclerosis. 2. Advanced right M1 segment stenosis that is new from a  2020 CTA. 3. 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 Chronic high-grade narrowing in the left V4 segment and basilar, non progressed from 2020.   Neck MRA 2/8: 1. Chronic occlusion of the distal right  vertebral artery. 2. 50% narrowing at the left vertebral origin, chronic when compared to 2020 CTA. 3. No stenosis of the cervical common and internal carotids.  Echocardiogram 2/8: 1. Left ventricular ejection fraction, by estimation, is 60 to 65%. The left ventricle has normal function. The left ventricle has no regional wall motion abnormalities. Left ventricular diastolic parameters are consistent with Grade I diastolic dysfunction (impaired relaxation).   2. Right ventricular systolic function is normal. The right ventricular size is normal. Tricuspid regurgitation signal is inadequate for assessing PA pressure.   3. The mitral valve is grossly normal. Trivial mitral valve regurgitation.   4. The aortic valve is grossly normal. Aortic valve regurgitation is mild.   5. The inferior vena cava is normal in size with greater than 50% respiratory variability, suggesting right atrial pressure of 3 mmHg.   6. Cannot exclude a small PFO.   Assessment: 88 year old [place] speaking male who presented to the ED on 2/7 for evaluation of a fall out of bed with left-sided weakness.  Left-sided weakness initial eval by patient's daughter after discharge on 2/3 where he was noted to be dragging his left foot and having trouble holding onto the walker with his left hand.  MRI brain on 2/8 reveals a small right corona radiata infarct. - Examination reveals patient with left-sided weakness upper > lower extremity, left-sided mouth droop, and per patient's daughter at bedside-occasional word finding difficulties.  His initial NIHSS is 4. - Imaging as above. -Presentation is most consistent with patient's acute infarction identified on MRI brain.  Patient's daughter expresses concern that the patient returning  to his independent living facility and feels that he needs skilled nursing care.  Will complete stroke work-up patient will follow with stroke team.  Recommendations: - Statin for LDL < 70 - HgbA1c at goal of < 7% - Frequent neuro checks - Prophylactic therapy-Antiplatelet med: Continue ASA 81 mg PO daily in addition to clopidogrel 75 mg PO daily for 21 days  - Risk factor modification - Telemetry monitoring - PT consult, OT consult, Speech consult - Stroke team to follow  Pt seen by NP/Neuro and later by MD. Note/plan to be edited by MD as needed.  Anibal Henderson, AGAC-NP [facility] Pager: [phone]   I have seen the patient and reviewed the above note.  He has a small subcortical stroke, which I suspect is likely small vessel in etiology.  Nonocclusive embolism resulting in the MCA stenosis would be another possibility, though I feel less likely.  Either way, I currently feel like he needs to be on dual antiplatelet therapy.  At the time of his last stroke his LDL was 49 and therefore he has not been on a statin, but now his LDL is 102 and therefore I think he needs to be on a statin from this point forward.  On Plavix, he achieved a verify now of 178, which is in the responder category and therefore though he was on Brilinta before I will start him on Plavix now.   Roland Rack, MD [facility] [phone]  If 7pm- 7am, please page neurology on call as listed in [place].

## 2021-07-01 ENCOUNTER — Other Ambulatory Visit: Payer: Self-pay | Admitting: Family Medicine

## 2021-07-01 DIAGNOSIS — I679 Cerebrovascular disease, unspecified: Secondary | ICD-10-CM

## 2021-07-01 DIAGNOSIS — I63511 Cerebral infarction due to unspecified occlusion or stenosis of right middle cerebral artery: Secondary | ICD-10-CM

## 2021-07-01 DIAGNOSIS — Z8673 Personal history of transient ischemic attack (TIA), and cerebral infarction without residual deficits: Secondary | ICD-10-CM

## 2021-07-01 LAB — BASIC METABOLIC PANEL
Anion gap: 9 (ref 5–15)
BUN: 29 mg/dL — ABNORMAL HIGH (ref 8–23)
CO2: 24 mmol/L (ref 22–32)
Calcium: 9.6 mg/dL (ref 8.9–10.3)
Chloride: 104 mmol/L (ref 98–111)
Creatinine, Ser: 1.54 mg/dL — ABNORMAL HIGH (ref 0.61–1.24)
GFR, Estimated: 42 mL/min — ABNORMAL LOW (ref >60)
Glucose, Bld: 111 mg/dL — ABNORMAL HIGH (ref 70–99)
Potassium: 4 mmol/L (ref 3.5–5.1)
Sodium: 137 mmol/L (ref 135–145)

## 2021-07-01 LAB — VITAMIN B12: Vitamin B-12: 341 pg/mL (ref 180–914)

## 2021-07-01 LAB — CBC
HCT: 38.4 % — ABNORMAL LOW (ref 39.0–52.0)
Hemoglobin: 12.9 g/dL — ABNORMAL LOW (ref 13.0–17.0)
MCH: 29.9 pg (ref 26.0–34.0)
MCHC: 33.6 g/dL (ref 30.0–36.0)
MCV: 89.1 fL (ref 80.0–100.0)
Platelets: 256 10*3/uL (ref 150–400)
RBC: 4.31 MIL/uL (ref 4.22–5.81)
RDW: 12.7 % (ref 11.5–15.5)
WBC: 8.1 10*3/uL (ref 4.0–10.5)
nRBC: 0 % (ref 0.0–0.2)

## 2021-07-01 LAB — MAGNESIUM: Magnesium: 2.2 mg/dL (ref 1.7–2.4)

## 2021-07-01 MED ORDER — CYANOCOBALAMIN 1000 MCG/ML IJ SOLN
1000.0000 ug | Freq: Every day | INTRAMUSCULAR | Status: AC
  Administered 2021-07-01 – 2021-07-07 (×7): 1000 ug via INTRAMUSCULAR
  Filled 2021-07-01 (×7): qty 1

## 2021-07-01 MED ORDER — PANTOPRAZOLE SODIUM 40 MG PO TBEC
40.0000 mg | DELAYED_RELEASE_TABLET | Freq: Every day | ORAL | Status: DC
  Administered 2021-07-01 – 2021-07-07 (×7): 40 mg via ORAL
  Filled 2021-07-01 (×7): qty 1

## 2021-07-01 MED ORDER — BLISTEX MEDICATED EX OINT
1.0000 "application " | TOPICAL_OINTMENT | CUTANEOUS | Status: DC | PRN
  Administered 2021-07-02: 1 via TOPICAL
  Filled 2021-07-01: qty 6.3

## 2021-07-01 MED ORDER — CYANOCOBALAMIN 1000 MCG/ML IJ SOLN: 1000.0000 ug | INTRAMUSCULAR | Status: DC

## 2021-07-01 NOTE — Evaluation (Signed)
Speech Language Pathology Evaluation Patient Details Name: [name] MRN: [id number] DOB: 12/04/1928 Today's Date: 07/01/2021 Time: 3664-4034 SLP Time Calculation (min) (ACUTE ONLY): 32 min  Problem List:  Patient Active Problem List   Diagnosis Date Noted   CVA (cerebral vascular accident) ([place]) with intracranial vascular stenosis 06/29/2021   Intracranial vascular stenosis 06/29/2021   Prediabetes 06/29/2021   Normocytic anemia 06/29/2021   History of stroke 06/29/2021   Other drug-induced neutropenia ([place]) 06/09/2021   Malignant neoplasm of ascending colon ([place]) 06/09/2021   Stenosis of artery ([place]) 06/09/2021   Preventative health care 03/01/2021   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   Kidney disease, chronic, stage III (moderate, EGFR 30-59 ml/min) ([place]) 08/07/2020   Leukopenia 08/07/2020   Acute respiratory failure with hypoxia ([place]) 08/07/2020   SIRS (systemic inflammatory response syndrome) ([place]) 08/07/2020   Methotrexate adverse reaction, initial encounter    Acute pain of right shoulder 04/14/2020   Lower extremity edema 11/13/2019   Head trauma 11/13/2019   Dyslipidemia 11/13/2019   Memory loss 07/28/2019   Chronic right shoulder pain 07/28/2019   Frequent falls 07/28/2019   Balance problem 07/28/2019   Dysphagia 07/28/2019   Traumatic hemorrhage of cerebrum, unspecified, without loss of consciousness, initial encounter ([place])    Basilar artery stenosis with infarction ([place]) 12/18/2017   TIA (transient ischemic attack) 12/10/2017   Rheumatoid arthritis ([place])    Chest pain 06/02/2011   General medical examination 04/11/2011   PEPTIC ULCER DISEASE, HELICOBACTER PYLORI POSITIVE 07/12/2010   PERSONAL HISTORY MALIG NEOPLASM LARGE INTESTINE 05/18/2010   MITRAL VALVE DISORDERS 04/05/2009   HOARSENESS 12/04/2008   OSTEOPENIA 08/26/2008   HYPERTRIGLYCERIDEMIA 07/23/2008   EPISTAXIS 07/26/2007   ERECTILE DYSFUNCTION 02/22/2007   ECZEMA 12/07/2006   LEG CRAMPS 10/09/2006    Hypothyroidism 08/28/2006   Essential hypertension 08/28/2006   HAY FEVER 08/28/2006   INSOMNIA 08/28/2006   Past Medical History:  Past Medical History:  Diagnosis Date   Adenocarcinoma ([place]) 1985   colon s/p right hemicolectomy and chemotherapy. F/U with cancer center and Dr. Lucia Gaskins rotinely   Depression    Eczema    Epistaxis    f/u per ENT   Erectile dysfunction    Gastric ulcer 12/11   H-Pylori Tx, EGD 3-12: gastritis   Hay fever    Head injury 11/2017   with fall   Hyperlipemia    Hypertension    Hypothyroidism    Hypothyroid   Insomnia    Osteopenia     per DEXA 07-2008 (Rx fosamax)   Rheumatoid arthritis ([place])    Stroke ([place])     " balance issue"   Past Surgical History:  Past Surgical History:  Procedure Laterality Date   COLON SURGERY     Right / Adenocarcinoma / Chemo   COLONOSCOPY  2005, 2008   Dr. Lucia Gaskins   INGUINAL HERNIA REPAIR     Right   IR ANGIO INTRA EXTRACRAN SEL COM CAROTID INNOMINATE BILAT MOD SED  12/13/2017   IR ANGIO VERTEBRAL SEL VERTEBRAL BILAT MOD SED  12/13/2017   IR PTA INTRACRANIAL  12/18/2017   RADIOLOGY WITH ANESTHESIA N/A 12/18/2017   Procedure: Angioplasty with stenting;  Surgeon: Luanne Bras, MD;  Location: [facility];  Service: Radiology;  Laterality: N/A;   HPI:  88 y.o. male presented 06/29/21 after having a fall. 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  PMH significant of hypertension, CVA, CKD 3, hypothyroidism, colon cancer s/p hemicolectomy with chemotherapy, RA, and  memory issues   Assessment / Plan / Recommendation Clinical Impression  Pt was seen with use of interpreter Lockie Pares Vo #[id number] and then resumed with Caryl Pina [phone] after connection was lost). Per interpreters used, pt has speech that is slurred at times, with intelligibility also impacted by softer phonation, which improved as he was repositioned more upright. They also reported what sounds to be language of confusion  at times. Pt was appropriately emotional about his current situation, showing at least basic orientation to situation and location and some awareness of deficits, although it is hard to get him to identify specific impairments. He seems to be tangential, sometimes talking for a long time to the interpreter without allowing time for interpretation. PT was also present for a portion of this evaluation to address cognition within a more functional context, throughout which time he was able to follow commands and sustain attention with minimal cueing. Pt would benefit from SLP f/u acutely and at AIR level upon discharge.    SLP Assessment  SLP Visit Diagnosis: Cognitive communication deficit (R41.841)    Recommendations for follow up therapy are one component of a multi-disciplinary discharge planning process, led by the attending physician.  Recommendations may be updated based on patient status, additional functional criteria and insurance authorization.    Follow Up Recommendations  Acute inpatient rehab (3hours/day)    Assistance Recommended at Discharge  Intermittent Supervision/Assistance  Functional Status Assessment Patient has had a recent decline in their functional status and demonstrates the ability to make significant improvements in function in a reasonable and predictable amount of time.  Frequency and Duration min 2x/week  2 weeks      SLP Evaluation Cognition  Overall Cognitive Status: No family/caregiver present to determine baseline cognitive functioning Arousal/Alertness: Awake/alert Orientation Level: Oriented to person;Oriented to place;Oriented to situation Attention: Sustained Sustained Attention: Impaired Sustained Attention Impairment: Functional complex       Comprehension  Auditory Comprehension Overall Auditory Comprehension: Impaired Commands: Impaired One Step Basic Commands: 75-100% accurate Interfering Components: Attention    Expression  Expression Primary Mode of Expression: Verbal Verbal Expression Overall Verbal Expression: Impaired (some language of confusion as described by interpreter?)   Oral / Motor  Motor Speech Overall Motor Speech:  (slurred speech at times per interpreter)             Osie Bond., M.A. [facility] Pager [phone] Office [phone]  07/01/2021, 1:31 PM

## 2021-07-01 NOTE — TOC Progression Note (Signed)
Transition of Care [facility]) - Progression Note    Patient Details  Name: [name] MRN: [id number] Date of Birth: 1929/03/27  [facility]) CM/SW [place], RN Phone Number: 07/01/2021, 3:44 PM  Clinical Narrative:    Spoke with Daughter regarding pl;ans for IOP rehabilitation.  She shared that she has been thinking of several places and she has to come up from [place] to care for her dad, feed him etc. Her mother and dad are currently in [place] with a hold for assisted living if needed.  Once she started speaking she realized the best option would be for her dad to be at [facility] IP rehabilitation. She thanked me for the time to speak to her.  Messaged team to let them know decision.    Expected Discharge Plan: IP Rehab Facility Barriers to Discharge: Continued Medical Work up  Expected Discharge Plan and Services Expected Discharge Plan: [place]   Discharge Planning Services: CM Consult   Living arrangements for the past 2 months: [place]                                       Social Determinants of Health (SDOH) Interventions    Readmission Risk Interventions Readmission Risk Prevention Plan 03/11/2019  Post Dischage Appt Complete  Medication Screening Complete  Transportation Screening Complete  Some recent data might be hidden

## 2021-07-01 NOTE — Progress Notes (Addendum)
STROKE TEAM PROGRESS NOTE   ATTENDING NOTE: I reviewed above note and agree with the assessment and plan. Pt was seen and examined.   88 year old male with history of hypertension, hyperlipidemia, CKD 3, rheumatoid arthritis colon cancer status post surgery and chemotherapy, stroke in 11/2017 admitted for left-sided weakness and a fall at assisted living facility.  In 11/2017, patient admitted for left-sided weakness and dizziness.  MRI showed bilateral cerebellum and right pontine infarct.  CTA head and neck showed right VA occlusion left V4 and VB junction stenosis.  EF 60 to 65%, LDL 49, A1c 6.3.  Discharged on DAPT.  Patient stated that his symptoms resolved in 2 weeks.  On this admission MRI showed right small CR infarct.  MRA head and neck showed right M1 high-grade stenosis, right V4 chronic occlusion, left VA 50% stenosis.  EF 60 to 65%.  LDL 102, A1c 5.8.  Creatinine 1.53.  On exam, wife at bedside, interpretation through iPad program.  Patient awake alert, orientated to place age and time.  No aphasia, fluent language, mild dysarthria, able to name, follows some commands..  Visual field fall, no gaze palsy, left nasolabial fold flattening with mild left facial droop.  Left upper extremity proximal with pronator drift, distal finger grip 3/5.  Right upper extremity at least 4/5.  Bilateral lower extremity symmetrical 4/5.  Sensation symmetrical, finger-nose grossly intact on the right, left mild ataxia not out of proportion to weakness.  Etiology for patient stroke likely small vessel disease given location and risk factors.  However comparing with CTA head and neck in 11/2017, this time right M1 high-grade stenosis was new.  Concerning if there is embolic phenomena stat causing new right M1 high-grade stenosis.  Patient endorsed heart palpitation with exercises for exertion, recommend 30-day cardiac event monitoring as outpatient to rule out A-fib.  Given right M1 high-grade stenosis, recommend  aspirin 325 and Plavix 75 DAPT for 3 months and then Plavix alone.  Now on Lipitor 40, continue on discharge.  PT/OT recommend CIR.  Patient follows with Dr. Leonie Man as outpatient.  For detailed assessment and plan, please refer to above as I have made changes wherever appropriate.   Neurology will sign off. Please call with questions. Pt will follow up with stroke clinic Dr. Leonie Man at [facility] in about 4 weeks. Thanks for the consult.   Rosalin Hawking, MD PhD Stroke Neurology 07/01/2021 5:44 PM    INTERVAL HISTORY No family at the bedside. Spoke with daughter, [name], on the phone, all questions answered to the best of my ability. Reached out to CM and CIR for questions about rehab. She is concerned about the patient not having enough activity while he in the hospital. PT/OT ordered and he is being screened for CIR. currently on Plavix 75 mg, increase from 81 mg to 325 mg.  Vitals:   06/30/21 2315 07/01/21 0000 07/01/21 0354 07/01/21 0758  BP: (!) 145/57 (!) 107/50 (!) 157/71   Pulse: 65  61 63  Resp: 18 16 14    Temp: 98.2 F (36.8 C) 98.8 F (37.1 C) 98.1 F (36.7 C) 97.9 F (36.6 C)  TempSrc: Oral Oral Oral Oral  SpO2:  98% 99% 94%   CBC:  Recent Labs  Lab 06/24/21 1025 06/29/21 0051 06/30/21 0403 07/01/21 0250  WBC 9.2 7.9 8.0 8.1  NEUTROABS 6.1 4.4  --   --   HGB 13.2 12.1* 13.1 12.9*  HCT 40.9 36.7* 40.6 38.4*  MCV 91.5 91.8 89.6 89.1  PLT 234  237 249 607   Basic Metabolic Panel:  Recent Labs  Lab 06/30/21 0403 07/01/21 0250  NA 137 137  K 4.0 4.0  CL 103 104  CO2 23 24  GLUCOSE 101* 111*  BUN 22 29*  CREATININE 1.53* 1.54*  CALCIUM 9.6 9.6  MG  --  2.2   Lipid Panel:  Recent Labs  Lab 06/29/21 2154  CHOL 173  TRIG 170*  HDL 37*  CHOLHDL 4.7  VLDL 34  LDLCALC 102*   HgbA1c:  Recent Labs  Lab 06/29/21 2154  HGBA1C 5.8*   Urine Drug Screen: No results for input(s): LABOPIA, COCAINSCRNUR, LABBENZ, AMPHETMU, THCU, LABBARB in the last 168 hours.   Alcohol Level No results for input(s): ETH in the last 168 hours.  IMAGING past 24 hours No results found.  PHYSICAL EXAM  Physical Exam  Constitutional: Appears well-developed and well-nourished.  Psych: Affect appropriate to situation Eyes: No scleral injection HENT: No OP obstrucion MSK: no joint deformities.  Cardiovascular: Normal rate and regular rhythm.  Respiratory: Effort normal, non-labored breathing GI: Soft.  No distension. There is no tenderness.  Skin: WDI  Neuro: Mental Status:  Awake, alert, and oriented to person, place, time, and situation. Able to answer simple questions in [place]. Does require a [name]. Speech is clear and intact Cranial Nerves:  II: PERRL 4 mm/brisk. Visual fields full.  III, IV, VI: EOMI without ptosis, nystagmus, or gaze preference V: Sensation is intact to light touch and symmetrical to face. VII: Face is asymmetric with left-sided mouth droop VIII: Hearing is intact to voice IX, X: Palate elevation is symmetric. Phonation normal.  XI: Normal sternocleidomastoid and trapezius muscle strength XII: Tongue protrudes midline without fasciculations.   Motor: 5/5 strength present in right upper and lower extremity. Left upper extremity weakness is present.  Left hand grasp is weaker than right hand.  Left lower extremity minimally weak with minimal drift on assessment. Tone is normal. Bulk is normal.  Sensation: Intact to light touch bilaterally in all four extremities. No extinction to DSS present.  Coordination: FTN intact bilaterally. HKS intact bilaterally. No pronator drift.  Gait: Deferred for patient's safety with reports of multiple recent falls  ASSESSMENT/PLAN [name] is a 88 y.o. male with history of  essential hypertension, hyperlipidemia, CVA, CKD stage III, hypothyroidism, colon cancer s/p hemicolectomy with chemotherapy, and rheumatoid arthritis presenting after a fall at his assisted living facility.    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 50% narrowing at the left vertebral origin, chronic  2D Echo- EF 60-65%, normal LV function. Cannot exclude small PFO LDL 102 HgbA1c 5.8 VTE prophylaxis - SCDs aspirin 81 mg daily prior to admission, now on aspirin 325 mg daily and clopidogrel 75 mg daily.  Therapy recommendations:  CIR, CM looking at facilities in [place] as well Disposition:  Pending  Hypertension Home meds:  lasix 20mg  daily Stable Permissive hypertension (OK if < 220/120) but gradually normalize in 5-7 days Long-term BP goal normotensive  Hyperlipidemia Home meds:  gemfibrozil,  resumed in hospital And atorvastatin 40 mg LDL 102, goal < 70 Continue statin at discharge   Other Stroke Risk Factors Advanced Age >/=  90  Hx stroke/TIA Small acute infarcts in both cerebral hemispheres 2019 cerebral angiogram with distal vertebrobasilar junction/proximal basilar artery stenosis-Dr. Estanislado Pandy patient  Other Active Problems Hypothyroidism Synthroid  Memory loss CKD Stage IIIb Gentle hydration Daily BMP, monitor renal function Cr. 1.54 Rheumatoid arthritis Mirabegron ER Frequent falls PT/OT eval Bed alarm on, fall precautions in place  Hospital day # 2  Patient seen and examined by NP/APP with MD. MD to update note as needed.   Janine Ores, DNP, FNP-BC [facility] Pager: [phone]  To contact Stroke Continuity provider, please refer to [email or web address] After hours, contact General Neurology

## 2021-07-01 NOTE — Progress Notes (Signed)
Physical Therapy Treatment Patient Details Name: [name] MRN: [id number] DOB: 12-21-1928 Today's Date: 07/01/2021   History of Present Illness 88 y.o. male presented 06/29/21 after having a fall. 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  PMH significant of hypertension, CVA, CKD 3, hypothyroidism, colon cancer s/p hemicolectomy with chemotherapy, RA, and memory issues    PT Comments    Continuing work on functional mobility and activity tolerance;  Pt seen in conjunction with  ST to get a fuller picture of cognition within functional tasks; Fear of falling persists with [name], and he was very stiff while getting up to EOB; Once sitting EOB, cues and encouragement to lean forward and sit upright; Better sit to stands today with use of stedy; the bar in front seems to put pt more at ease to be able to lean forward more for upright standing and balance; Able to march in place within stedy as well; Pt voiced he is ready to work hard to "get back to life" per Caryl Pina, interpreter; Continue to recommend acute inpatient rehab (AIR) for post-acute therapy needs.   Recommendations for follow up therapy are one component of a multi-disciplinary discharge planning process, led by the attending physician.  Recommendations may be updated based on patient status, additional functional criteria and insurance authorization.  Follow Up Recommendations  Acute inpatient rehab (3hours/day)     Assistance Recommended at Discharge Frequent or constant Supervision/Assistance  Patient can return home with the following Two people to help with walking and/or transfers;Two people to help with bathing/dressing/bathroom;Assistance with cooking/housework;Assistance with feeding;Direct supervision/assist for medications management;Direct supervision/assist for financial management;Assist for transportation   Equipment Recommendations  None recommended by PT    Recommendations  for Other Services Rehab consult     Precautions / Restrictions Precautions Precautions: Fall Precaution Comments: has fallen >10x in past month (per daughter); sometimes 2x/day     Mobility  Bed Mobility Overal bed mobility: Needs Assistance Bed Mobility: Supine to Sit     Supine to sit: Max assist     General bed mobility comments: very stiff, tending to keep trunk and hips extended; Opted to get up to EOB with lots of help, so that we can focus on standing and pre-gait    Transfers Overall transfer level: Needs assistance Equipment used: Ambulation equipment used Transfers: Sit to/from Stand Sit to Stand: Mod assist, +2 physical assistance           General transfer comment: posterior lean persists, but able to initiate forward lean by reaching forward for the bar of the stedy; Has power to rise; multimodal cues to come forward for upright standing    Ambulation/Gait Ambulation/Gait assistance: Min assist Gait Distance (Feet):  (March in place in stedy) Assistive device:  (heavily used bar of stedy)         General Gait Details: Heavy use of bar for UE support, but able to pick up knees for high stepping as he got more comfortable   Stairs             Wheelchair Mobility    Modified Rankin (Stroke Patients Only) Modified Rankin (Stroke Patients Only) Pre-Morbid Rankin Score: No significant disability Modified Rankin: Severe disability     Balance Overall balance assessment: Needs assistance   Sitting balance-Leahy Scale: Fair       Standing balance-Leahy Scale: Poor  Cognition Arousal/Alertness: Awake/alert Behavior During Therapy: WFL for tasks assessed/performed Overall Cognitive Status: No family/caregiver present to determine baseline cognitive functioning                                 General Comments: excellent participation        Exercises      General Comments  General comments (skin integrity, edema, etc.): Video interpreter, Caryl Pina, [phone], assisted with communication      Pertinent Vitals/Pain Pain Assessment Pain Assessment: No/denies pain    Home Living   Living Arrangements: Spouse/significant other     Home Access: Level entry       Home Layout: One level        Prior Function            PT Goals (current goals can now be found in the care plan section) Acute Rehab PT Goals Patient Stated Goal: to walk again, see family, garden PT Goal Formulation: With patient Time For Goal Achievement: 07/14/21 Potential to Achieve Goals: Good Progress towards PT goals: Progressing toward goals    Frequency    Min 4X/week      PT Plan Current plan remains appropriate    Co-evaluation PT/OT/SLP Co-Evaluation/Treatment: Yes Reason for Co-Treatment: Necessary to address cognition/behavior during functional activity PT goals addressed during session: Mobility/safety with mobility        AM-PAC PT "6 Clicks" Mobility   Outcome Measure  Help needed turning from your back to your side while in a flat bed without using bedrails?: A Lot Help needed moving from lying on your back to sitting on the side of a flat bed without using bedrails?: A Lot Help needed moving to and from a bed to a chair (including a wheelchair)?: A Lot Help needed standing up from a chair using your arms (e.g., wheelchair or bedside chair)?: A Lot Help needed to walk in hospital room?: A Lot Help needed climbing 3-5 steps with a railing? : A Lot 6 Click Score: 12    End of Session Equipment Utilized During Treatment: Gait belt Activity Tolerance: Patient tolerated treatment well Patient left: in chair;with call bell/phone within reach;with chair alarm set;with family/visitor present Nurse Communication: Mobility status;Need for lift equipment PT Visit Diagnosis: Unsteadiness on feet (R26.81);History of falling (Z91.81);Difficulty in walking, not  elsewhere classified (R26.2)     Time: 3244-0102 PT Time Calculation (min) (ACUTE ONLY): 38 min  Charges:  $Therapeutic Activity: 23-37 mins                     Roney Marion, PT  Acute Rehabilitation Services Pager [phone] Office [place] 07/01/2021, 3:16 PM

## 2021-07-01 NOTE — Progress Notes (Signed)
Inpatient Rehab Admissions:  Inpatient Rehab Consult received.  I met with patient at the bedside for rehabilitation assessment and to discuss goals and expectations of an inpatient rehab admission.  Used the [employer] interpretor, [place] Vo [id number]. Pt acknowledged understanding of CIR goals and expectations. Pt interested in pursuing CIR. Pt gave permission to contact daughter, [name]. Spoke with [name] on the telephone. She is interested in CIR but concerned about having to drive back and forth from [place]. She requested to have TOC contact her and provide her information about possible options in the [place] area. Will continue to follow.  Signed: Gayland Curry, [place], [place] Admissions Coordinator [phone]

## 2021-07-01 NOTE — Progress Notes (Signed)
PROGRESS NOTE    [name]  QVZ:[id number] DOB: 06/10/1928 DOA: 06/28/2021 PCP: [name], DO   Chief Complaint  Patient presents with   Fall    Brief Narrative:    [name] is a 88 y.o. male with medical history significant of hypertension, CVA, CKD 3, hypothyroidism, colon cancer s/p hemicolectomy with chemotherapy, RA, and memory issues who presents with presented after having a fall, his work up significant for acute CVA.   Assessment & Plan:   Principal Problem:   CVA (cerebral vascular accident) [facility]) with intracranial vascular stenosis Active Problems:   Hypothyroidism   Essential hypertension   Rheumatoid arthritis ([place])   Memory loss   Frequent falls   Dyslipidemia   Kidney disease, chronic, stage III (moderate, EGFR 30-59 ml/min) (HCC)   Intracranial vascular stenosis   Prediabetes   Normocytic anemia   History of stroke  CVA (cerebral vascular accident) ([place]) with intracranial vascular stenosis- (present on admission) Patient presents after having recurrent falls.  Daughter noted signs of weakness on the left side 4-5 days ago.  -  MRI noted small acute perforator infarct of the right corona radiata and advanced right M1 segment stenosis.  Given unclear onset of symptoms patient was not a candidate for tPA. -Was seen by PT/OT, recommendation for CIR which has been consulted. -Neurology input greatly appreciated, recommendation for aspirin 325 mg and Plavix 75 mg oral daily, he was on aspirin 81 mg prior to admission. -She is acceptable at 5.8 -LDL  is elevated at 102 , he is on gemfibrozil at home, started on atorvastatin 40 mg oral daily  Frequent falls -  frequency of falls likely due to new stroke as noted above.   -Fall precautions -PT/OT to evaluate and treat   Memory loss- (present on admission) Daughter notes that the patient does get easily disoriented and confused. -Delirium precautions -TSH within normal limit,  -B12 is borderline  low at 341, will start on IM supplements   Hypothyroidism- (present on admission) TSH was 1.835.  Home medication regimen includes levothyroxine 100 mcg daily. -Continue levothyroxine   History of stroke He previously had a stroke back in 2019 with small acute infarcts in both cerebral hemispheres.  Patient underwent cerebral angiogram with distal vertebrobasilar junction/proximal basilar artery stenosis angioplasty with Dr. Estanislado Pandy.    Normocytic anemia- (present on admission) Hemoglobin 12.1 which appears near patient's baseline. -Continue to monitor   Prediabetes- (present on admission) Last hemoglobin A1c was 6.3.  Patient not on any diabetic medications. -Follow-up repeat hemoglobin A1c   Kidney disease, chronic, stage III (moderate, EGFR 30-59 ml/min) (HCC)- (present on admission) On admission creatinine 1.59 with a BUN 23 which appears near patient's baseline. -Continue to monitor   Dyslipidemia- (present on admission) -Follow-up lipid panel -Continue gemfibrozil -Consider need to add on statin if LDL not less than 70.   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   Essential hypertension- (present on admission) Blood pressure was elevated up to 170/68.  He is not on any medications for blood pressure and takes furosemide as needed for fluid. -Allowing for permissive hypertension at this time.          DVT prophylaxis: Lovenox Code Status: Full Family Communication: None at bedside. Disposition: CIR  Status is: Inpatient Remains inpatient appropriate because: Patient is medically stable for discharge once he is approved for  rehab by insurance           Consultants:  neurology   Subjective:  Patient denies any complaints today, he denies any focal deficits, he has good  appetite.  Objective: Vitals:   07/01/21 0000 07/01/21 0354 07/01/21 0758 07/01/21 1203  BP: (!) 107/50 (!) 157/71  134/61  Pulse:  61 63   Resp: '16 14  16  ' Temp: 98.8 F (37.1 C) 98.1 F (36.7 C) 97.9 F (36.6 C) 97.8 F (36.6 C)  TempSrc: Oral Oral Oral Oral  SpO2: 98% 99% 94%     Intake/Output Summary (Last 24 hours) at 07/01/2021 1405 Last data filed at 07/01/2021 0345 Gross per 24 hour  Intake --  Output 1100 ml  Net -1100 ml   There were no vitals filed for this visit.  Examination:  Awake Alert, frail, pleasant interactive and follow commands appropriately Symmetrical Chest wall movement, Good air movement bilaterally, CTAB RRR,No Gallops,Rubs or new Murmurs, No Parasternal Heave +ve B.Sounds, Abd Soft, No tenderness, No rebound - guarding or rigidity. No Cyanosis, Clubbing or edema, No new Rash or bruise      Data Reviewed: I have personally reviewed following labs and imaging studies  CBC: Recent Labs  Lab 06/29/21 0051 06/30/21 0403 07/01/21 0250  WBC 7.9 8.0 8.1  NEUTROABS 4.4  --   --   HGB 12.1* 13.1 12.9*  HCT 36.7* 40.6 38.4*  MCV 91.8 89.6 89.1  PLT 237 249 383    Basic Metabolic Panel: Recent Labs  Lab 06/29/21 0051 06/30/21 0403 07/01/21 0250  NA 136 137 137  K 3.5 4.0 4.0  CL 103 103 104  CO2 '25 23 24  ' GLUCOSE 123* 101* 111*  BUN 23 22 29*  CREATININE 1.59* 1.53* 1.54*  CALCIUM 9.8 9.6 9.6  MG  --   --  2.2    GFR: Estimated Creatinine Clearance: 25.1 mL/min (A) (by C-G formula based on SCr of 1.54 mg/dL (H)).  Liver Function Tests: Recent Labs  Lab 06/29/21 0051  AST 20  ALT 11  ALKPHOS 64  BILITOT 0.2*  PROT 7.0  ALBUMIN 3.2*    CBG: No results for input(s): GLUCAP in the last 168 hours.   Recent Results (from the past 240 hour(s))  Resp Panel by RT-PCR (Flu A&B, Covid) Nasopharyngeal Swab     Status: None   Collection Time: 06/24/21  7:34 AM   Specimen: Nasopharyngeal Swab; Nasopharyngeal(NP) swabs in vial  transport medium  Result Value Ref Range Status   SARS Coronavirus 2 by RT PCR NEGATIVE NEGATIVE Final    Comment: (NOTE) SARS-CoV-2 target nucleic acids are NOT DETECTED.  The SARS-CoV-2 RNA is generally detectable in upper respiratory specimens during the acute phase of infection. The lowest concentration of SARS-CoV-2 viral copies this assay can detect is 138 copies/mL. A negative result does not preclude SARS-Cov-2 infection and should not be used as the sole basis for treatment or other patient management decisions. A negative result may occur with  improper specimen collection/handling, submission of specimen other than nasopharyngeal swab, presence of viral mutation(s) within the areas targeted by this assay, and inadequate number of viral copies(<138 copies/mL). A negative result must be combined with clinical observations, patient history, and epidemiological information. The expected result is Negative.  Fact Sheet for Patients:  EntrepreneurPulse.com.au  Fact Sheet for Healthcare Providers:  IncredibleEmployment.be  This test is no t yet approved or cleared by the [place] FDA and  has been authorized for detection  and/or diagnosis of SARS-CoV-2 by FDA under an Emergency Use Authorization (EUA). This EUA will remain  in effect (meaning this test can be used) for the duration of the COVID-19 declaration under Section 564(b)(1) of the Act, 21 U.S.C.section 360bbb-3(b)(1), unless the authorization is terminated  or revoked sooner.       Influenza A by PCR NEGATIVE NEGATIVE Final   Influenza B by PCR NEGATIVE NEGATIVE Final    Comment: (NOTE) The Xpert Xpress SARS-CoV-2/FLU/RSV plus assay is intended as an aid in the diagnosis of influenza from Nasopharyngeal swab specimens and should not be used as a sole basis for treatment. Nasal washings and aspirates are unacceptable for Xpert Xpress SARS-CoV-2/FLU/RSV testing.  Fact  Sheet for Patients: EntrepreneurPulse.com.au  Fact Sheet for Healthcare Providers: IncredibleEmployment.be  This test is not yet approved or cleared by the [place] FDA and has been authorized for detection and/or diagnosis of SARS-CoV-2 by FDA under an Emergency Use Authorization (EUA). This EUA will remain in effect (meaning this test can be used) for the duration of the COVID-19 declaration under Section 564(b)(1) of the Act, 21 U.S.C. section 360bbb-3(b)(1), unless the authorization is terminated or revoked.  Performed at [facility], [place] [address] [place], [place] [address]          Radiology Studies: No results found.      Scheduled Meds:   stroke: mapping our early stages of recovery book   Does not apply Once   aspirin EC  325 mg Oral Daily   atorvastatin  40 mg Oral Daily   clopidogrel  75 mg Oral Daily   docusate sodium  200 mg Oral Daily   enoxaparin (LOVENOX) injection  30 mg Subcutaneous Q24H   gemfibrozil  600 mg Oral QPM   levothyroxine  100 mcg Oral QAC breakfast   mirabegron ER  25 mg Oral QHS   pantoprazole  40 mg Oral Daily   Continuous Infusions:   LOS: 2 days        Phillips Climes, MD [facility]   To contact the attending provider between 7A-7P or the covering provider during after hours 7P-7A, please log into the web site [email or web address] and access using universal [place] password for that web site. If you do not have the password, please call the hospital operator.  07/01/2021, 2:05 PM

## 2021-07-02 NOTE — PMR Pre-admission (Signed)
PMR Admission Coordinator Pre-Admission Assessment  Patient: [name] is an 88 y.o., male MRN: [id number] DOB: May 06, 1929 Height:   Weight:    Insurance Information HMO: yes    PPO:      PCP:      IPA:      80/20:      OTHER:  PRIMARY: U[employer] Medicare      Policy#: [id number]      Subscriber: patient CM Name: Phalendria      Phone#: [phone] option #5     Fax#: [phone] Pre-Cert#: [id number]  approved for 7 days    Employer:  Benefits:  Phone #: [email or web address]     Name:  Eff. Date: 05/22/21     Deduct: does not have deductible      Out of Pocket Max: $3,600 ($35 met)      Life Max: NA CIR: $295/day co-pay for days 1-5, 100% coverage days 6+      SNF: 100% coverage days 1-20, $196 co-pay/day days 21-39, 100% coverage days 40-100 Outpatient: $20 visit/co-pay     Co-Pay:  Home Health: 100% coverage      Co-Pay:  DME: 80% coverage     Co-Pay: 20% co-insurance Providers: in-network SECONDARY:       Policy#:      Phone#:   Development worker, community:       Phone#:   [employer] for patients in Inpatient Rehabilitation Facilities with attached Privacy Act [place] Records was provided and verbally reviewed with: Family  Emergency Contact Information Contact Information     Name Relation Home Work Mobile   [name] Daughter [phone]  [phone]   Garlitz,Trang Spouse [phone]        Current Medical History  Patient Admitting Diagnosis: R CVA  History of Present Illness: Pt is a 88 year old male with medical hx significant for: HTN, CVA, CKD 3, hypothyroidism, colon CA s/p hemicolectomy with chemotherapy, RA, memory issues. Pt presented to the hospital on 06/28/21 after a fall. Pt was c/o left head, shoulder, arm, and right hip pain. Pt had recently been to ER two times prior after falls. In ER, daughter reported she noticed left-sided deficits approximately a week prior to current hospital presentation. X-rays of chest,  humerus, and shoulder did not reveal any acute abnormalities. CT head and cervical spine did not reveal any acute abnormality. Urinalysis did not show any significant signs of infection. MRI showed small acute perforator infarct of right corona radiata with advanced right M1 segment stenosis. Therapy evaluations completed and CIR recommended d/t pt's deficits in functional mobility, ability to complete ADLs independently and cognitive-linguistic deficits.  Complete NIHSS TOTAL: 1  Patient's medical record from [facility] has been reviewed by the rehabilitation admission coordinator and physician.  Past Medical History  Past Medical History:  Diagnosis Date   Adenocarcinoma ([place]) 1985   colon s/p right hemicolectomy and chemotherapy. F/U with cancer center and Dr. Lucia Gaskins rotinely   Depression    Eczema    Epistaxis    f/u per ENT   Erectile dysfunction    Gastric ulcer 12/11   H-Pylori Tx, EGD 3-12: gastritis   Hay fever    Head injury 11/2017   with fall   Hyperlipemia    Hypertension    Hypothyroidism    Hypothyroid   Insomnia    Osteopenia     per DEXA 07-2008 (Rx fosamax)   Rheumatoid arthritis ([place])    Stroke ([place])     "  balance issue"   Has the patient had major surgery during 100 days prior to admission? No  Family History   family history is not on file.  Current Medications  Current Facility-Administered Medications:     stroke: mapping our early stages of recovery book, , Does not apply, Once, Norval Morton, MD   acetaminophen (TYLENOL) tablet 650 mg, 650 mg, Oral, Q4H PRN, 650 mg at 07/07/21 0828 **OR** acetaminophen (TYLENOL) 160 MG/5ML solution 650 mg, 650 mg, Per Tube, Q4H PRN **OR** acetaminophen (TYLENOL) suppository 650 mg, 650 mg, Rectal, Q4H PRN, Tamala Julian, Rondell A, MD   aspirin EC tablet 325 mg, 325 mg, Oral, Daily, Rosalin Hawking, MD, 325 mg at 07/07/21 8413   atorvastatin (LIPITOR) tablet 40 mg, 40 mg, Oral, Daily, Greta Doom, MD, 40  mg at 07/07/21 0810   clopidogrel (PLAVIX) tablet 75 mg, 75 mg, Oral, Daily, Greta Doom, MD, 75 mg at 07/07/21 0810   [COMPLETED] cyanocobalamin ((VITAMIN B-12)) injection 1,000 mcg, 1,000 mcg, Intramuscular, Daily, 1,000 mcg at 07/07/21 0809 **FOLLOWED BY** [START ON 07/08/2021] cyanocobalamin ((VITAMIN B-12)) injection 1,000 mcg, 1,000 mcg, Intramuscular, Weekly, Elgergawy, Silver Huguenin, MD   docusate sodium (COLACE) capsule 200 mg, 200 mg, Oral, Daily, Smith, Rondell A, MD, 200 mg at 07/07/21 0809   enoxaparin (LOVENOX) injection 30 mg, 30 mg, Subcutaneous, Q24H, Smith, Rondell A, MD, 30 mg at 07/07/21 2440   guaiFENesin (MUCINEX) 12 hr tablet 600 mg, 600 mg, Oral, BID PRN, Tamala Julian, Rondell A, MD   levothyroxine (SYNTHROID) tablet 100 mcg, 100 mcg, Oral, QAC breakfast, Tamala Julian, Rondell A, MD, 100 mcg at 07/07/21 0557   lip balm (BLISTEX) ointment 1 application, 1 application, Topical, PRN, Elgergawy, Silver Huguenin, MD, 1 application at 03/17/24 0636   loratadine (CLARITIN) tablet 10 mg, 10 mg, Oral, Daily PRN, Tamala Julian, Rondell A, MD   LORazepam (ATIVAN) injection 1 mg, 1 mg, Intravenous, Once PRN, Tamala Julian, Rondell A, MD   melatonin tablet 3 mg, 3 mg, Oral, QHS PRN, Opyd, Ilene Qua, MD, 3 mg at 07/06/21 2119   mirabegron ER (MYRBETRIQ) tablet 25 mg, 25 mg, Oral, QHS, Smith, Rondell A, MD, 25 mg at 07/06/21 2119   pantoprazole (PROTONIX) EC tablet 40 mg, 40 mg, Oral, Daily, Elgergawy, Silver Huguenin, MD, 40 mg at 07/07/21 0810   polyvinyl alcohol (LIQUIFILM TEARS) 1.4 % ophthalmic solution 1 drop, 1 drop, Both Eyes, Daily PRN, Tamala Julian, Rondell A, MD   senna-docusate (Senokot-S) tablet 1 tablet, 1 tablet, Oral, QHS PRN, Fuller Plan A, MD, 1 tablet at 07/02/21 2102  Patients Current Diet:  Diet Order             Diet - low sodium heart healthy           Diet Heart Room service appropriate? Yes; Fluid consistency: Thin  Diet effective now                  Precautions /  Restrictions Precautions Precautions: Fall Precaution Comments: has fallen >10x in past month (per daughter); sometimes 2x/day Restrictions Weight Bearing Restrictions: No   Has the patient had 2 or more falls or a fall with injury in the past year? Yes  Prior Activity Level    Prior Functional Level Self Care: Did the patient need help bathing, dressing, using the toilet or eating? Independent  Indoor Mobility: Did the patient need assistance with walking from room to room (with or without device)? Independent  Stairs: Did the patient need assistance with internal or  external stairs (with or without device)? Needed some help  Functional Cognition: Did the patient need help planning regular tasks such as shopping or remembering to take medications? Needed some help  Patient Information Are you of Hispanic, Latino/a,or Spanish origin?: A. No, not of Hispanic, Latino/a, or Spanish origin What is your race?: I. Vietnamese Do you need or want an interpreter to communicate with a doctor or health care staff?: 1. Yes  Patient's Response To:  Health Literacy and Transportation Is the patient able to respond to health literacy and transportation needs?: Yes Health Literacy - How often do you need to have someone help you when you read instructions, pamphlets, or other written material from your doctor or pharmacy?: Often In the past 12 months, has lack of transportation kept you from medical appointments or from getting medications?: No In the past 12 months, has lack of transportation kept you from meetings, work, or from getting things needed for daily living?: No  Home Assistive Devices / Equipment Home Equipment: Conservation officer, nature (2 wheels), Rollator (4 wheels), Grab bars - tub/shower  Prior Device Use: Indicate devices/aids used by the patient prior to current illness, exacerbation or injury?  cane  Current Functional Level Cognition  Arousal/Alertness: Awake/alert Overall  Cognitive Status: No family/caregiver present to determine baseline cognitive functioning Orientation Level: Oriented to person, Disoriented to place, Oriented to time, Oriented to situation General Comments: Noting slower initiation, and incr time to follow through with tasks; Even so, pt will change the way he is approaching a task to be able to complete it (i.e. reached under bar of stedy to get teh wascloth when it was difficult to reach over); Continued excellent motivation and participation; Able to verbalize that he was able to stand better two sessions ago than today Attention: Sustained Sustained Attention: Impaired Sustained Attention Impairment: Functional complex    Extremity Assessment (includes Sensation/Coordination)  Upper Extremity Assessment: Generalized weakness LUE Deficits / Details: L UE AROM impaired, pt's daughter reports that pt has had decreased ROM at baseline, arthritis LUE: Shoulder pain with ROM  Lower Extremity Assessment: Defer to PT evaluation    ADLs  Overall ADL's : Needs assistance/impaired Eating/Feeding: Set up, Supervision/ safety, Sitting Grooming: Wash/dry face, Wash/dry hands, Standing Grooming Details (indicate cue type and reason): Standing with Stedy at sink Upper Body Bathing: Minimal assistance, Standing Upper Body Bathing Details (indicate cue type and reason): standing at sink in [place] Lower Body Bathing: Maximal assistance Upper Body Dressing : Minimal assistance, Sitting Upper Body Dressing Details (indicate cue type and reason): Impaired dynamic sitting balannce Lower Body Dressing: Total assistance Toilet Transfer: Maximal assistance, +2 for safety/equipment Toilet Transfer Details (indicate cue type and reason): simulated, used Stedy for standing, 4 trials sit - stand from chair to [place] and Hygiene: Total assistance Functional mobility during ADLs: Maximal assistance, +2 for physical assistance     Mobility  Overal bed mobility: Needs Assistance Bed Mobility: Supine to Sit Supine to sit: Max assist General bed mobility comments: pt in chair up with PT uon arrival    Transfers  Overall transfer level: Needs assistance Equipment used: Ambulation equipment used Transfers: Sit to/from Stand, Bed to chair/wheelchair/BSC Sit to Stand: Max assist, Mod assist, +2 safety/equipment, +2 physical assistance Bed to/from chair/wheelchair/BSC transfer type:: Stand pivot Stand pivot transfers: Max assist, +2 physical assistance Transfer via Lift Equipment: Stedy General transfer comment: posterior lean persists, but able to initiate forward lean by reaching forward for the bar of the stedy;  Has power to rise; multimodal cues to come forward for upright standing; initial sit to stand requiring +2 Max assist for support due to the very heavy posterior bias; Performed serial sit <> stands x4 with notable improvements in anterior weight shifts with multimodal cues (last 3 transfers were from elevated seat of the stedy)    Ambulation / Gait / Stairs / Wheelchair Mobility  Ambulation/Gait Ambulation/Gait assistance: Herbalist (Feet):  (March in place in stedy) Assistive device:  (heavily used bar of stedy) General Gait Details: Heavy use of bar for UE support, but able to pick up knees for high stepping as he got more comfortable Pre-gait activities: Unable to practice today due to difficulty maintaining stand    Posture / Balance Dynamic Sitting Balance Sitting balance - Comments: posterior imbalance Balance Overall balance assessment: Needs assistance Sitting-balance support: No upper extremity supported, Feet unsupported Sitting balance-Leahy Scale: Fair Sitting balance - Comments: posterior imbalance Postural control: Posterior lean Standing balance support: Bilateral upper extremity supported, During functional activity, Single extremity supported Standing balance-Leahy Scale:  Poor Standing balance comment: static standing within stedy approximately with mod assist; held approximately 10 seconds before needing to sit    Special needs/care consideration Skin Abrasion: knee/left; Ecchymosis: arm/left, Bladder incontinence, External urinary catheter and Special service needs [place] interpreter   Previous Home Environment  Living Arrangements: Spouse/significant other  Lives With: Spouse Available Help at Discharge: Family, Personal care attendant, Available 24 hours/day Type of Home: [place] Name: [place]: One level Home Access: Level entry Bathroom Shower/Tub: Multimedia programmer: Handicapped height Bathroom Accessibility: Yes How Accessible: Accessible via walker, Accessible via wheelchair Home Care Services: No Additional Comments: daughter reoprts that pt has had 10+ falls  Discharge Living Setting Plans for Discharge Living Setting: Other (Comment) ([facility]/ALF) Type of Home at Discharge: Assisted living (if pt not able to d/c to ILF, daughter wants him d/c'd to ALF) [place] Name at Discharge: [facility] Discharge Home Layout: One level Discharge Home Access: Level entry Discharge Bathroom Shower/Tub: Walk-in shower Discharge Bathroom Toilet: Handicapped height Discharge Bathroom Accessibility: Yes How Accessible: Accessible via walker, Accessible via wheelchair Does the patient have any problems obtaining your medications?: No  Social/Family/Support Systems Patient Roles: Spouse Anticipated Caregiver: [name], daughter and facility staff Anticipated Caregiver's Contact Information: Betty:[phone] Caregiver Availability: 24/7 Discharge Plan Discussed with Primary Caregiver: Yes Is Caregiver In Agreement with Plan?: Yes Does Caregiver/Family have Issues with Lodging/Transportation while Pt is in Rehab?: No  Goals Patient/Family Goal for Rehab:  Supervision-Min A: PT/OT/ST Expected length of stay: 18-20 days Pt/Family Agrees to Admission and willing to participate: Yes Program Orientation Provided & Reviewed with Pt/Caregiver Including Roles  & Responsibilities: Yes  Decrease burden of Care through IP rehab admission: NA  Possible need for SNF placement upon discharge: Not anticipated  Patient Condition: I have reviewed medical records from [facility], spoken with CSW, and patient and daughter. I met with patient at the bedside and discussed via phone for inpatient rehabilitation assessment.  Patient will benefit from ongoing PT, OT, and SLP, can actively participate in 3 hours of therapy a day 5 days of the week, and can make measurable gains during the admission.  Patient will also benefit from the coordinated team approach during an Inpatient Acute Rehabilitation admission.  The patient will receive intensive therapy as well as Rehabilitation physician, nursing, social worker, and care management interventions.  Due to bladder management,  safety, skin/wound care, disease management, medication administration, and patient education the patient requires 24 hour a day rehabilitation nursing.  The patient is currently mod to max assist overall with mobility and basic ADLs.  Discharge setting and therapy post discharge at assisted living facility is anticipated.  Patient has agreed to participate in the Acute Inpatient Rehabilitation Program and will admit today.  Preadmission Screen Completed By:  Gayland Curry SLP with updates by Cleatrice Burke, 07/07/2021 10:05 AM ______________________________________________________________________   Discussed status with Dr. Dagoberto Ligas on 07/07/21 at 1006 and received approval for admission today.  Admission Coordinator:Lauren Lind Covert SLP with updates by Cleatrice Burke, RN, time  1006 Date 07/07/2021   Assessment/Plan: Diagnosis: Does the need for close, 24 hr/day  Medical supervision in concert with the patient's rehab needs make it unreasonable for this patient to be served in a less intensive setting? Yes Co-Morbidities requiring supervision/potential complications: HTN, GVS2V, colon cancer s/p hemicolectomy and chemo; RA, memory loss; R CVA Due to bladder management, bowel management, safety, skin/wound care, disease management, medication administration, pain management, and patient education, does the patient require 24 hr/day rehab nursing? Yes Does the patient require coordinated care of a physician, rehab nurse, PT, OT, and SLP to address physical and functional deficits in the context of the above medical diagnosis(es)? Yes Addressing deficits in the following areas: balance, endurance, locomotion, strength, transferring, bowel/bladder control, bathing, dressing, feeding, grooming, toileting, and cognition Can the patient actively participate in an intensive therapy program of at least 3 hrs of therapy 5 days a week? Yes The potential for patient to make measurable gains while on inpatient rehab is good and fair Anticipated functional outcomes upon discharge from inpatient rehab: supervision and min assist PT, supervision and min assist OT, supervision and min assist SLP Estimated rehab length of stay to reach the above functional goals is: 18-20 days Anticipated discharge destination: Home 10. Overall Rehab/Functional Prognosis: good and fair   MD Signature:

## 2021-07-02 NOTE — Progress Notes (Signed)
Inpatient Rehab Admissions Coordinator:  Saw pt and daughter at bedside. Daughter has decided she would like pt to pursue CIR. Will continue to follow.  Gayland Curry, [place], [place] Admissions Coordinator [phone]

## 2021-07-02 NOTE — Progress Notes (Signed)
PROGRESS NOTE    [name]  NGE:[id number] DOB: [birth year] DOA: 06/28/2021 PCP: [name], DO   Chief Complaint  Patient presents with   Fall    Brief Narrative:    [name] is a 88 y.o. male with medical history significant of hypertension, CVA, CKD 3, hypothyroidism, colon cancer s/p hemicolectomy with chemotherapy, RA, and memory issues who presents with presented after having a fall, his work up significant for acute CVA.   Assessment & Plan:   Principal Problem:   CVA (cerebral vascular accident) [facility]) with intracranial vascular stenosis Active Problems:   Hypothyroidism   Essential hypertension   Rheumatoid arthritis ([place])   Memory loss   Frequent falls   Dyslipidemia   Kidney disease, chronic, stage III (moderate, EGFR 30-59 ml/min) (HCC)   Intracranial vascular stenosis   Prediabetes   Normocytic anemia   History of stroke  CVA (cerebral vascular accident) ([place]) with intracranial vascular stenosis- (present on admission) Patient presents after having recurrent falls.  Daughter noted signs of weakness on the left side 4-5 days ago.  -  MRI noted small acute perforator infarct of the right corona radiata and advanced right M1 segment stenosis.  Given unclear onset of symptoms patient was not a candidate for tPA. -Was seen by PT/OT, recommendation for CIR which has been consulted. -Neurology input greatly appreciated, recommendation for aspirin 325 mg and Plavix 75 mg oral daily X 3 months then plavix alone, he was on aspirin 81 mg prior to admission. -She is acceptable at 5.8 -LDL  is elevated at 102 , he is on gemfibrozil at home, started on atorvastatin 40 mg oral daily, gemfibrozil has been discontinued due to increased risk of rhabdomyolysis with statin  Frequent falls -  frequency of falls likely due to new stroke as noted above.   -Fall precautions -PT/OT to evaluate and treat   Memory loss- (present on admission) Daughter notes that the  patient does get easily disoriented and confused. -Delirium precautions -TSH within normal limit,  -B12 is borderline low at 341, will start on IM supplements   Hypothyroidism- (present on admission) TSH was 1.835.  Home medication regimen includes levothyroxine 100 mcg daily. -Continue levothyroxine   History of stroke He previously had a stroke back in 2019 with small acute infarcts in both cerebral hemispheres.  Patient underwent cerebral angiogram with distal vertebrobasilar junction/proximal basilar artery stenosis angioplasty with Dr. Estanislado Pandy.    Normocytic anemia- (present on admission) Hemoglobin 12.1 which appears near patient's baseline. -Continue to monitor   Prediabetes- (present on admission) Last hemoglobin A1c was 6.3.  Patient not on any diabetic medications. -Follow-up repeat hemoglobin A1c   Kidney disease, chronic, stage III (moderate, EGFR 30-59 ml/min) (HCC)- (present on admission) On admission creatinine 1.59 with a BUN 23 which appears near patient's baseline. -Continue to monitor   Dyslipidemia- (present on admission) -DL is elevated at 102, he was started on statin, but he is on gemfibrozil at home, high risk for rhabdomyolysis, so gemfibrozil has been discontinued.   Rheumatoid arthritis ([place])- (present on admission) Methotrexate was discontinued after patient developed ulcerative stomatitis for which it appears it was discontinued on 08/07/2020 after being restarted 08/01/2020.   Essential hypertension- (present on admission) Blood pressure was elevated up to 170/68.  He is not on any medications for blood pressure and takes furosemide as needed for fluid. -Allowing for permissive hypertension at this time.      DVT prophylaxis: Lovenox Code Status: Full Family Communication: None  at bedside. Disposition: CIR  Status is: Inpatient Remains inpatient appropriate because: Patient is medically stable for discharge once he is approved for rehab by  insurance           Consultants:  neurology   Subjective:  Patient denies any complaints today, he denies any focal deficits, he has good appetite.  Objective: Vitals:   07/02/21 0000 07/02/21 0327 07/02/21 0745 07/02/21 1200  BP: (!) 111/51 121/63 135/67 (!) 115/59  Pulse: 67 66 65 63  Resp: $Remo'16 15 18 19  'QRieA$ Temp:  98.4 F (36.9 C) 98.5 F (36.9 C)   TempSrc:  Axillary Oral   SpO2: 95% 96% 97% 95%    Intake/Output Summary (Last 24 hours) at 07/02/2021 1226 Last data filed at 07/02/2021 0330 Gross per 24 hour  Intake 240 ml  Output 1700 ml  Net -1460 ml   There were no vitals filed for this visit.  Examination:  Awake Alert, frail, pleasant interactive and follow commands appropriately Symmetrical Chest wall movement, Good air movement bilaterally, CTAB RRR,No Gallops,Rubs or new Murmurs, No Parasternal Heave +ve B.Sounds, Abd Soft, No tenderness, No rebound - guarding or rigidity. No Cyanosis, Clubbing or edema, No new Rash or bruise      Data Reviewed: I have personally reviewed following labs and imaging studies  CBC: Recent Labs  Lab 06/29/21 0051 06/30/21 0403 07/01/21 0250  WBC 7.9 8.0 8.1  NEUTROABS 4.4  --   --   HGB 12.1* 13.1 12.9*  HCT 36.7* 40.6 38.4*  MCV 91.8 89.6 89.1  PLT 237 249 768    Basic Metabolic Panel: Recent Labs  Lab 06/29/21 0051 06/30/21 0403 07/01/21 0250  NA 136 137 137  K 3.5 4.0 4.0  CL 103 103 104  CO2 $Re'25 23 24  'WkO$ GLUCOSE 123* 101* 111*  BUN 23 22 29*  CREATININE 1.59* 1.53* 1.54*  CALCIUM 9.8 9.6 9.6  MG  --   --  2.2    GFR: Estimated Creatinine Clearance: 25.1 mL/min (A) (by C-G formula based on SCr of 1.54 mg/dL (H)).  Liver Function Tests: Recent Labs  Lab 06/29/21 0051  AST 20  ALT 11  ALKPHOS 64  BILITOT 0.2*  PROT 7.0  ALBUMIN 3.2*    CBG: No results for input(s): GLUCAP in the last 168 hours.   Recent Results (from the past 240 hour(s))  Resp Panel by RT-PCR (Flu A&B, Covid)  Nasopharyngeal Swab     Status: None   Collection Time: 06/24/21  7:34 AM   Specimen: Nasopharyngeal Swab; Nasopharyngeal(NP) swabs in vial transport medium  Result Value Ref Range Status   SARS Coronavirus 2 by RT PCR NEGATIVE NEGATIVE Final    Comment: (NOTE) SARS-CoV-2 target nucleic acids are NOT DETECTED.  The SARS-CoV-2 RNA is generally detectable in upper respiratory specimens during the acute phase of infection. The lowest concentration of SARS-CoV-2 viral copies this assay can detect is 138 copies/mL. A negative result does not preclude SARS-Cov-2 infection and should not be used as the sole basis for treatment or other patient management decisions. A negative result may occur with  improper specimen collection/handling, submission of specimen other than nasopharyngeal swab, presence of viral mutation(s) within the areas targeted by this assay, and inadequate number of viral copies(<138 copies/mL). A negative result must be combined with clinical observations, patient history, and epidemiological information. The expected result is Negative.  Fact Sheet for Patients:  EntrepreneurPulse.com.au  Fact Sheet for Healthcare Providers:  IncredibleEmployment.be  This test is  no t yet approved or cleared by the [place] and  has been authorized for detection and/or diagnosis of SARS-CoV-2 by FDA under an Emergency Use Authorization (EUA). This EUA will remain  in effect (meaning this test can be used) for the duration of the COVID-19 declaration under Section 564(b)(1) of the Act, 21 U.S.C.section 360bbb-3(b)(1), unless the authorization is terminated  or revoked sooner.       Influenza A by PCR NEGATIVE NEGATIVE Final   Influenza B by PCR NEGATIVE NEGATIVE Final    Comment: (NOTE) The Xpert Xpress SARS-CoV-2/FLU/RSV plus assay is intended as an aid in the diagnosis of influenza from Nasopharyngeal swab specimens and should not be  used as a sole basis for treatment. Nasal washings and aspirates are unacceptable for Xpert Xpress SARS-CoV-2/FLU/RSV testing.  Fact Sheet for Patients: EntrepreneurPulse.com.au  Fact Sheet for Healthcare Providers: IncredibleEmployment.be  This test is not yet approved or cleared by the [place] FDA and has been authorized for detection and/or diagnosis of SARS-CoV-2 by FDA under an Emergency Use Authorization (EUA). This EUA will remain in effect (meaning this test can be used) for the duration of the COVID-19 declaration under Section 564(b)(1) of the Act, 21 U.S.C. section 360bbb-3(b)(1), unless the authorization is terminated or revoked.  Performed at [facility], [place] [address] [place], [place] [address]          Radiology Studies: No results found.      Scheduled Meds:   stroke: mapping our early stages of recovery book   Does not apply Once   aspirin EC  325 mg Oral Daily   atorvastatin  40 mg Oral Daily   clopidogrel  75 mg Oral Daily   cyanocobalamin  1,000 mcg Intramuscular Daily   Followed by   Derrill Memo ON 07/08/2021] cyanocobalamin  1,000 mcg Intramuscular Weekly   docusate sodium  200 mg Oral Daily   enoxaparin (LOVENOX) injection  30 mg Subcutaneous Q24H   levothyroxine  100 mcg Oral QAC breakfast   mirabegron ER  25 mg Oral QHS   pantoprazole  40 mg Oral Daily   Continuous Infusions:   LOS: 3 days        Christian Climes, MD [facility]   To contact the attending provider between 7A-7P or the covering provider during after hours 7P-7A, please log into the web site [email or web address] and access using universal [place] password for that web site. If you do not have the password, please call the hospital operator.  07/02/2021, 12:26 PM

## 2021-07-03 NOTE — Plan of Care (Signed)
  Problem: Education: Goal: Knowledge of General Education information will improve Description Including pain rating scale, medication(s)/side effects and non-pharmacologic comfort measures Outcome: Progressing   

## 2021-07-03 NOTE — Progress Notes (Signed)
PROGRESS NOTE    [name]  PPJ:[id number] DOB: 08-Jul-1928 DOA: 06/28/2021 PCP: [name], DO   Chief Complaint  Patient presents with   Fall    Brief Narrative:    [name] is a 88 y.o. male with medical history significant of hypertension, CVA, CKD 3, hypothyroidism, colon cancer s/p hemicolectomy with chemotherapy, RA, and memory issues who presents with presented after having a fall, his work up significant for acute CVA.   Assessment & Plan:   Principal Problem:   CVA (cerebral vascular accident) [name]) with intracranial vascular stenosis Active Problems:   Hypothyroidism   Essential hypertension   Rheumatoid arthritis ([name])   Memory loss   Frequent falls   Dyslipidemia   Kidney disease, chronic, stage III (moderate, EGFR 30-59 ml/min) (HCC)   Intracranial vascular stenosis   Prediabetes   Normocytic anemia   History of stroke  CVA (cerebral vascular accident) ([name]) with intracranial vascular stenosis- (present on admission) Patient presents after having recurrent falls.  Daughter noted signs of weakness on the left side 4-5 days ago.  -  MRI noted small acute perforator infarct of the right corona radiata and advanced right M1 segment stenosis.  Given unclear onset of symptoms patient was not a candidate for tPA. -Was seen by PT/OT, recommendation for CIR which has been consulted. -Neurology input greatly appreciated, recommendation for aspirin 325 mg and Plavix 75 mg oral daily X 3 months then plavix alone, he was on aspirin 81 mg prior to admission. -She is acceptable at 5.8 -LDL  is elevated at 102 , he is on gemfibrozil at home, started on atorvastatin 40 mg oral daily, gemfibrozil has been discontinued due to increased risk of rhabdomyolysis with statin  Frequent falls -  frequency of falls likely due to new stroke as noted above.   -Fall precautions -PT/OT to evaluate and treat   Memory loss- (present on admission) Daughter notes that the  patient does get easily disoriented and confused. -Delirium precautions -TSH within normal limit,  -B12 is borderline low at 341, will start on IM supplements   Hypothyroidism- (present on admission) TSH was 1.835.  Home medication regimen includes levothyroxine 100 mcg daily. -Continue levothyroxine   History of stroke He previously had a stroke back in 2019 with small acute infarcts in both cerebral hemispheres.  Patient underwent cerebral angiogram with distal vertebrobasilar junction/proximal basilar artery stenosis angioplasty with Dr. Estanislado Pandy.    Normocytic anemia- (present on admission) Hemoglobin 12.1 which appears near patient's baseline. -Continue to monitor   Prediabetes- (present on admission) Last hemoglobin A1c was 6.3.  Patient not on any diabetic medications. -Follow-up repeat hemoglobin A1c   Kidney disease, chronic, stage III (moderate, EGFR 30-59 ml/min) (HCC)- (present on admission) On admission creatinine 1.59 with a BUN 23 which appears near patient's baseline. -Continue to monitor   Dyslipidemia- (present on admission) -DL is elevated at 102, he was started on statin, but he is on gemfibrozil at home, high risk for rhabdomyolysis, so gemfibrozil has been discontinued.   Rheumatoid arthritis ([place])- (present on admission) Methotrexate was discontinued after patient developed ulcerative stomatitis for which it appears it was discontinued on 08/07/2020 after being restarted 08/01/2020.   Essential hypertension- (present on admission) Blood pressure was elevated up to 170/68.  He is not on any medications for blood pressure and takes furosemide as needed for fluid. -Allowing for permissive hypertension at this time.      DVT prophylaxis: Lovenox Code Status: Full Family Communication: Discussed  with daughter at bedside 2/11 Disposition: CIR  Status is: Inpatient Remains inpatient appropriate because: Patient is medically stable for discharge once he is  approved for rehab by insurance           Consultants:  neurology   Subjective:  Patient denies any complaints, no significant events,he has good appetite  Objective: Vitals:   07/03/21 0310 07/03/21 0400 07/03/21 0800 07/03/21 1354  BP: (!) 114/48 (!) 121/52 (!) 106/55 (!) 114/49  Pulse: 63 64 76 73  Resp: 16 18 20 19   Temp: 98.7 F (37.1 C)  97.8 F (36.6 C) 98 F (36.7 C)  TempSrc: Oral  Oral Oral  SpO2: 95% 97% 99% 95%    Intake/Output Summary (Last 24 hours) at 07/03/2021 1453 Last data filed at 07/03/2021 0316 Gross per 24 hour  Intake --  Output 1200 ml  Net -1200 ml   There were no vitals filed for this visit.  Examination:  Awake Alert, frail, pleasant interactive and follow commands appropriately Symmetrical Chest wall movement, Good air movement bilaterally, CTAB RRR,No Gallops,Rubs or new Murmurs, No Parasternal Heave +ve B.Sounds, Abd Soft, No tenderness, No rebound - guarding or rigidity. No Cyanosis, Clubbing or edema, No new Rash or bruise      Data Reviewed: I have personally reviewed following labs and imaging studies  CBC: Recent Labs  Lab 06/29/21 0051 06/30/21 0403 07/01/21 0250  WBC 7.9 8.0 8.1  NEUTROABS 4.4  --   --   HGB 12.1* 13.1 12.9*  HCT 36.7* 40.6 38.4*  MCV 91.8 89.6 89.1  PLT 237 249 793    Basic Metabolic Panel: Recent Labs  Lab 06/29/21 0051 06/30/21 0403 07/01/21 0250  NA 136 137 137  K 3.5 4.0 4.0  CL 103 103 104  CO2 25 23 24   GLUCOSE 123* 101* 111*  BUN 23 22 29*  CREATININE 1.59* 1.53* 1.54*  CALCIUM 9.8 9.6 9.6  MG  --   --  2.2    GFR: Estimated Creatinine Clearance: 25.1 mL/min (A) (by C-G formula based on SCr of 1.54 mg/dL (H)).  Liver Function Tests: Recent Labs  Lab 06/29/21 0051  AST 20  ALT 11  ALKPHOS 64  BILITOT 0.2*  PROT 7.0  ALBUMIN 3.2*    CBG: No results for input(s): GLUCAP in the last 168 hours.   Recent Results (from the past 240 hour(s))  Resp Panel by  RT-PCR (Flu A&B, Covid) Nasopharyngeal Swab     Status: None   Collection Time: 06/24/21  7:34 AM   Specimen: Nasopharyngeal Swab; Nasopharyngeal(NP) swabs in vial transport medium  Result Value Ref Range Status   SARS Coronavirus 2 by RT PCR NEGATIVE NEGATIVE Final    Comment: (NOTE) SARS-CoV-2 target nucleic acids are NOT DETECTED.  The SARS-CoV-2 RNA is generally detectable in upper respiratory specimens during the acute phase of infection. The lowest concentration of SARS-CoV-2 viral copies this assay can detect is 138 copies/mL. A negative result does not preclude SARS-Cov-2 infection and should not be used as the sole basis for treatment or other patient management decisions. A negative result may occur with  improper specimen collection/handling, submission of specimen other than nasopharyngeal swab, presence of viral mutation(s) within the areas targeted by this assay, and inadequate number of viral copies(<138 copies/mL). A negative result must be combined with clinical observations, patient history, and epidemiological information. The expected result is Negative.  Fact Sheet for Patients:  EntrepreneurPulse.com.au  Fact Sheet for Healthcare Providers:  IncredibleEmployment.be  This test is no t yet approved or cleared by the [place] and  has been authorized for detection and/or diagnosis of SARS-CoV-2 by FDA under an Emergency Use Authorization (EUA). This EUA will remain  in effect (meaning this test can be used) for the duration of the COVID-19 declaration under Section 564(b)(1) of the Act, 21 U.S.C.section 360bbb-3(b)(1), unless the authorization is terminated  or revoked sooner.       Influenza A by PCR NEGATIVE NEGATIVE Final   Influenza B by PCR NEGATIVE NEGATIVE Final    Comment: (NOTE) The Xpert Xpress SARS-CoV-2/FLU/RSV plus assay is intended as an aid in the diagnosis of influenza from Nasopharyngeal swab  specimens and should not be used as a sole basis for treatment. Nasal washings and aspirates are unacceptable for Xpert Xpress SARS-CoV-2/FLU/RSV testing.  Fact Sheet for Patients: EntrepreneurPulse.com.au  Fact Sheet for Healthcare Providers: IncredibleEmployment.be  This test is not yet approved or cleared by the [place] FDA and has been authorized for detection and/or diagnosis of SARS-CoV-2 by FDA under an Emergency Use Authorization (EUA). This EUA will remain in effect (meaning this test can be used) for the duration of the COVID-19 declaration under Section 564(b)(1) of the Act, 21 U.S.C. section 360bbb-3(b)(1), unless the authorization is terminated or revoked.  Performed at [facility], [place] [address], [place], [place] [address]          Radiology Studies: No results found.      Scheduled Meds:   stroke: mapping our early stages of recovery book   Does not apply Once   aspirin EC  325 mg Oral Daily   atorvastatin  40 mg Oral Daily   clopidogrel  75 mg Oral Daily   cyanocobalamin  1,000 mcg Intramuscular Daily   Followed by   Derrill Memo ON 07/08/2021] cyanocobalamin  1,000 mcg Intramuscular Weekly   docusate sodium  200 mg Oral Daily   enoxaparin (LOVENOX) injection  30 mg Subcutaneous Q24H   levothyroxine  100 mcg Oral QAC breakfast   mirabegron ER  25 mg Oral QHS   pantoprazole  40 mg Oral Daily   Continuous Infusions:   LOS: 4 days        Phillips Climes, MD [facility]   To contact the attending provider between 7A-7P or the covering provider during after hours 7P-7A, please log into the web site [email or web address] and access using universal  password for that web site. If you do not have the password, please call the [facility] operator.  07/03/2021, 2:53 PM

## 2021-07-04 NOTE — Progress Notes (Signed)
Inpatient Rehab Admissions Coordinator:  ?Insurance authorization started. Will continue to follow. ? ? ?Kihanna Kamiya Graves Madden, MS, CCC-SLP ?Admissions Coordinator ?[phone] ? ?

## 2021-07-04 NOTE — Telephone Encounter (Signed)
[facility] states they have not received the faxed orders yet. She would like for it to be faxed again to [phone]. Please advise.

## 2021-07-04 NOTE — TOC Progression Note (Signed)
Transition of Care [facility]) - Progression Note    Patient Details  Name: [name] MRN: [id number] Date of Birth: [birth year]  Transition of Care [facility]) CM/SW [place], LCSW Phone Number: 07/04/2021, 4:22 PM  Clinical Narrative:    CSW provided update to patient's daughter, [name]. She reported being hopeful for insurance approval for CIR.    Expected Discharge Plan: IP Rehab Facility Barriers to Discharge: Insurance Authorization  Expected Discharge Plan and Services Expected Discharge Plan: [place] In-house Referral: Clinical Social Work Discharge Planning Services: CM Consult Post Acute Care Choice: IP Rehab Living arrangements for the past 2 months: [place]                                       Social Determinants of Health (SDOH) Interventions    Readmission Risk Interventions Readmission Risk Prevention Plan 03/11/2019  Post Dischage Appt Complete  Medication Screening Complete  Transportation Screening Complete  Some recent data might be hidden

## 2021-07-04 NOTE — Progress Notes (Signed)
Occupational Therapy Treatment Patient Details Name: [name] MRN: [id number] DOB: 10-17-1928 Today's Date: 07/04/2021   History of present illness 88 y.o. male presented 06/29/21 after having a fall. 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  PMH significant of hypertension, CVA, CKD 3, hypothyroidism, colon cancer s/p hemicolectomy with chemotherapy, RA, and memory issues   OT comments  Pt making good progress with functional goals. Session focused on bed mobility to sit EOB, sitting balance, sit - stand transitions using Stedy, standing at sink for grooming, bathing, UB dressing seated in recliner, L UE AROM. Pt min guard A standing at sink (used [place]) and was able to increase upright standing with min verbal cues. Pt participates well, is highly motivated, and has good family support; Continue to recommend acute inpatient rehab (AIR) for post-acute therapy needs   Recommendations for follow up therapy are one component of a multi-disciplinary discharge planning process, led by the attending physician.  Recommendations may be updated based on patient status, additional functional criteria and insurance authorization.    Follow Up Recommendations  Acute inpatient rehab (3hours/day)    Assistance Recommended at Discharge    Patient can return home with the following  A lot of help with bathing/dressing/bathroom;Two people to help with walking and/or transfers;Direct supervision/assist for medications management;Direct supervision/assist for financial management;Assist for transportation;Help with stairs or ramp for entrance   Equipment Recommendations  Tub/shower seat;Other (comment) (TBD at next venue of care)    Recommendations for Other Services      Precautions / Restrictions Precautions Precautions: Fall Precaution Comments: has fallen >10x in past month (per daughter); sometimes 2x/day Restrictions Weight Bearing Restrictions: No        Mobility Bed Mobility Overal bed mobility: Needs Assistance Bed Mobility: Supine to Sit     Supine to sit: Max assist     General bed mobility comments: Good initiation of moving LEs to and off of EOB, as well as using LEs hooked off of EOB to "pull" hips and trunk towards EOB; when making the move to elevate trunk to sit, very stiff, tending to keep trunk and hips extended; Needed Max assist to come to fully seated position, and initial support posteriorly to keep sitting upright    Transfers Overall transfer level: Needs assistance Equipment used: Ambulation equipment used Charlaine Dalton) Transfers: Sit to/from Stand Sit to Stand: Max assist, Min assist, Min guard, +2 physical assistance, +2 safety/equipment           General transfer comment: posterior lean persists, but able to initiate forward lean by reaching forward for the bar of the stedy; Has power to rise; multimodal cues to come forward for upright standing; initial sit to stand requiring +2 Max assist for support due to the very heavy posterior bias; Performed serial sit <> stands x5 with notable improvements in anterior weight shifts with multimodal cues (target to lean towards helpful)     Balance Overall balance assessment: Needs assistance Sitting-balance support: No upper extremity supported, Feet unsupported Sitting balance-Leahy Scale: Fair Sitting balance - Comments: posterior imbalance Postural control: Posterior lean Standing balance support: Bilateral upper extremity supported, During functional activity, Single extremity supported Standing balance-Leahy Scale: Poor                             ADL either performed or assessed with clinical judgement   ADL Overall ADL's : Needs assistance/impaired     Grooming:  Wash/dry face;Wash/dry hands;Standing Grooming Details (indicate cue type and reason): Standing with Stedy at sink         Upper Body Dressing : Minimal assistance;Sitting        Toilet Transfer: Maximal assistance;+2 for safety/equipment Toilet Transfer Details (indicate cue type and reason): simulated, used Stedy for standing, 5 trials sit - stand from chair to [facility]                Extremity/Trunk Assessment Upper Extremity Assessment Upper Extremity Assessment: Generalized weakness LUE Deficits / Details: L UE AROM impaired, pt's daughter reports that pt has had decreased ROM at baseline, arthritis   Lower Extremity Assessment Lower Extremity Assessment: Defer to PT evaluation        Vision Baseline Vision/History: 1 Wears glasses Ability to See in Adequate Light: 0 Adequate Patient Visual Report: No change from baseline     Perception     Praxis      Cognition Arousal/Alertness: Awake/alert Behavior During Therapy: WFL for tasks assessed/performed Overall Cognitive Status: No family/caregiver present to determine baseline cognitive functioning                                 General Comments: excellent participation        Exercises      Shoulder Instructions       General Comments Video Interpreter [place] [phone]) present and facilitated communication    Pertinent Vitals/ Pain       Pain Assessment Pain Assessment: No/denies pain Faces Pain Scale: No hurt Pain Intervention(s): Monitored during session, Limited activity within patient's tolerance  Home Living                                          Prior Functioning/Environment              Frequency  Min 2X/week        Progress Toward Goals  OT Goals(current goals can now be found in the care plan section)  Progress towards OT goals: Progressing toward goals     Plan Discharge plan remains appropriate;Frequency remains appropriate    Co-evaluation    PT/OT/SLP Co-Evaluation/Treatment: Yes Reason for Co-Treatment: For patient/therapist safety;To address functional/ADL transfers PT goals addressed during session:  Mobility/safety with mobility OT goals addressed during session: ADL's and self-care;Proper use of Adaptive equipment and DME      AM-PAC OT "6 Clicks" Daily Activity     Outcome Measure   Help from another person eating meals?: None Help from another person taking care of personal grooming?: A Little Help from another person toileting, which includes using toliet, bedpan, or urinal?: Total Help from another person bathing (including washing, rinsing, drying)?: A Lot Help from another person to put on and taking off regular upper body clothing?: A Little Help from another person to put on and taking off regular lower body clothing?: Total 6 Click Score: 14    End of Session Equipment Utilized During Treatment: Gait belt;Other (comment) Charlaine Dalton)  OT Visit Diagnosis: Unsteadiness on feet (R26.81);Other abnormalities of gait and mobility (R26.89);History of falling (Z91.81);Muscle weakness (generalized) (M62.81);Other symptoms and signs involving cognitive function   Activity Tolerance Patient tolerated treatment well   Patient Left in chair;with call bell/phone within reach;with chair alarm set   Nurse Communication  Time: 7972-8206 OT Time Calculation (min): 41 min  Charges: OT General Charges $OT Visit: 1 Visit OT Treatments $Self Care/Home Management : 8-22 mins $Therapeutic Activity: 8-22 mins   Britt Bottom 07/04/2021, 1:16 PM

## 2021-07-04 NOTE — Progress Notes (Signed)
Physical Therapy Treatment Patient Details Name: [name] MRN: [id number] DOB: November 20, 1928 Today's Date: 07/04/2021   History of Present Illness 88 y.o. male presented 06/29/21 after having a fall. 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  PMH significant of hypertension, CVA, CKD 3, hypothyroidism, colon cancer s/p hemicolectomy with chemotherapy, RA, and memory issues    PT Comments    Continuing work on functional mobility and activity tolerance;  Session focused on transfer training, balance, and functional tasks; Seen in conjunction with OT; Notable improvement in anterior weight shifting and coordination with practice and repetition; First sit to stand required 2 person Max assist to achieve fully upright standing, but with repetition pt was able to stand up from recliner to stedy with min/minguard assistance; Stood within the stedy for the majority of the session (see OT note for further details re: activity at sink); Pt participates well, is highly motivated, and has family support; Continue to recommend acute inpatient rehab (AIR) for post-acute therapy needs.   Recommendations for follow up therapy are one component of a multi-disciplinary discharge planning process, led by the attending physician.  Recommendations may be updated based on patient status, additional functional criteria and insurance authorization.  Follow Up Recommendations  Acute inpatient rehab (3hours/day)     Assistance Recommended at Discharge Frequent or constant Supervision/Assistance  Patient can return home with the following Two people to help with walking and/or transfers;Two people to help with bathing/dressing/bathroom;Assistance with cooking/housework;Assistance with feeding;Direct supervision/assist for medications management;Direct supervision/assist for financial management;Assist for transportation   Equipment Recommendations  None recommended by PT     Recommendations for Other Services       Precautions / Restrictions Precautions Precautions: Fall Precaution Comments: has fallen >10x in past month (per daughter); sometimes 2x/day Restrictions Weight Bearing Restrictions: No     Mobility  Bed Mobility Overal bed mobility: Needs Assistance Bed Mobility: Supine to Sit     Supine to sit: Max assist     General bed mobility comments: Good initiation of moving LEs to and off of EOB, as well as using LEs hooked off of EOB to "pull" hips and trunk towards EOB; when making the move to elevate trunk to sit, very stiff, tending to keep trunk and hips extended; Needed Max assist to come to fully seated position, and initial support posteriorly to keep sitting upright    Transfers Overall transfer level: Needs assistance Equipment used: Ambulation equipment used Transfers: Sit to/from Stand Sit to Stand: Max assist, Min assist, Min guard, +2 physical assistance, +2 safety/equipment           General transfer comment: posterior lean persists, but able to initiate forward lean by reaching forward for the bar of the stedy; Has power to rise; multimodal cues to come forward for upright standing; initial sit to stand requiring +2 Max assist for support due to the very heavy posterior bias; Performed serial sit <> stands x5 with notable improvements in anterior weight shifts with multimodal cues (target to lean towards helpful)    Ambulation/Gait             Pre-gait activities: Standing high steps R and L in stedy; noting incr hip flexion and posterior lean with more dynamic activities, can return to fully upright with cues     Stairs             Wheelchair Mobility    Modified Rankin (Stroke Patients Only) Modified Rankin (Stroke Patients Only)  Pre-Morbid Rankin Score: No significant disability Modified Rankin: Severe disability     Balance Overall balance assessment: Needs assistance   Sitting balance-Leahy  Scale: Fair Sitting balance - Comments: posterior imbalance Postural control: Posterior lean Standing balance support: Bilateral upper extremity supported Standing balance-Leahy Scale: Poor (Approaching Fair)                              Cognition Arousal/Alertness: Awake/alert Behavior During Therapy: WFL for tasks assessed/performed Overall Cognitive Status: No family/caregiver present to determine baseline cognitive functioning                                 General Comments: excellent participation        Exercises      General Comments General comments (skin integrity, edema, etc.): Video Interpreter Chi [phone]) present and facilitated communication      Pertinent Vitals/Pain Pain Assessment Pain Assessment: No/denies pain    Home Living                          Prior Function            PT Goals (current goals can now be found in the care plan section) Acute Rehab PT Goals Patient Stated Goal: to walk again, see family, garden PT Goal Formulation: With patient Time For Goal Achievement: 07/14/21 Potential to Achieve Goals: Good Progress towards PT goals: Progressing toward goals    Frequency    Min 4X/week      PT Plan Current plan remains appropriate    Co-evaluation PT/OT/SLP Co-Evaluation/Treatment: Yes Reason for Co-Treatment: For patient/therapist safety;To address functional/ADL transfers PT goals addressed during session: Mobility/safety with mobility        AM-PAC PT "6 Clicks" Mobility   Outcome Measure  Help needed turning from your back to your side while in a flat bed without using bedrails?: A Lot Help needed moving from lying on your back to sitting on the side of a flat bed without using bedrails?: A Lot Help needed moving to and from a bed to a chair (including a wheelchair)?: A Lot Help needed standing up from a chair using your arms (e.g., wheelchair or bedside chair)?: A Lot Help needed  to walk in hospital room?: A Lot Help needed climbing 3-5 steps with a railing? : Total 6 Click Score: 11    End of Session Equipment Utilized During Treatment: Gait belt Activity Tolerance: Patient tolerated treatment well Patient left: in chair;with call bell/phone within reach;with chair alarm set Nurse Communication: Mobility status;Need for lift equipment PT Visit Diagnosis: Unsteadiness on feet (R26.81);History of falling (Z91.81);Difficulty in walking, not elsewhere classified (R26.2)     Time: 0940-1030 PT Time Calculation (min) (ACUTE ONLY): 50 min  Charges:  $Therapeutic Activity: 8-22 mins                     Roney Marion, PT  Acute Rehabilitation Services Pager [phone] Office [place] 07/04/2021, 12:08 PM

## 2021-07-04 NOTE — Progress Notes (Signed)
PROGRESS NOTE    [name]  NGE:[id number] DOB: [birth year] DOA: 06/28/2021 PCP: [name], DO   Chief Complaint  Patient presents with   Fall    Brief Narrative:    [name] is a 88 y.o. male with medical history significant of hypertension, CVA, CKD 3, hypothyroidism, colon cancer s/p hemicolectomy with chemotherapy, RA, and memory issues who presents with presented after having a fall, his work up significant for acute CVA.   Assessment & Plan:   Principal Problem:   CVA (cerebral vascular accident) [facility]) with intracranial vascular stenosis Active Problems:   Hypothyroidism   Essential hypertension   Rheumatoid arthritis ([place])   Memory loss   Frequent falls   Dyslipidemia   Kidney disease, chronic, stage III (moderate, EGFR 30-59 ml/min) (HCC)   Intracranial vascular stenosis   Prediabetes   Normocytic anemia   History of stroke  CVA (cerebral vascular accident) ([place]) with intracranial vascular stenosis- (present on admission) Patient presents after having recurrent falls.  Daughter noted signs of weakness on the left side 4-5 days ago.  -  MRI noted small acute perforator infarct of the right corona radiata and advanced right M1 segment stenosis.  Given unclear onset of symptoms patient was not a candidate for tPA. -Was seen by PT/OT, recommendation for CIR which has been consulted. -Neurology input greatly appreciated, recommendation for aspirin 325 mg and Plavix 75 mg oral daily X 3 months then plavix alone, he was on aspirin 81 mg prior to admission. -She is acceptable at 5.8 -LDL  is elevated at 102 , he is on gemfibrozil at home, started on atorvastatin 40 mg oral daily, gemfibrozil has been discontinued due to increased risk of rhabdomyolysis with statin  Frequent falls -  frequency of falls likely due to new stroke as noted above.   -Fall precautions -PT/OT to evaluate and treat   Memory loss- (present on admission) Daughter notes that the  patient does get easily disoriented and confused. -Delirium precautions -TSH within normal limit,  -B12 is borderline low at 341, will start on IM supplements   Hypothyroidism- (present on admission) TSH was 1.835.  Home medication regimen includes levothyroxine 100 mcg daily. -Continue levothyroxine   History of stroke He previously had a stroke back in 2019 with small acute infarcts in both cerebral hemispheres.  Patient underwent cerebral angiogram with distal vertebrobasilar junction/proximal basilar artery stenosis angioplasty with Dr. Estanislado Pandy.    Normocytic anemia- (present on admission) Hemoglobin 12.1 which appears near patient's baseline. -Continue to monitor   Prediabetes- (present on admission) Last hemoglobin A1c was 6.3.  Patient not on any diabetic medications. -Follow-up repeat hemoglobin A1c   Kidney disease, chronic, stage III (moderate, EGFR 30-59 ml/min) (HCC)- (present on admission) On admission creatinine 1.59 with a BUN 23 which appears near patient's baseline. -Continue to monitor   Dyslipidemia- (present on admission) -DL is elevated at 102, he was started on statin, but he is on gemfibrozil at home, high risk for rhabdomyolysis, so gemfibrozil has been discontinued.   Rheumatoid arthritis ([place])- (present on admission) Methotrexate was discontinued after patient developed ulcerative stomatitis for which it appears it was discontinued on 08/07/2020 after being restarted 08/01/2020.   Essential hypertension- (present on admission) Blood pressure was elevated up to 170/68.  He is not on any medications for blood pressure and takes furosemide as needed for fluid. -Allowing for permissive hypertension at this time.      DVT prophylaxis: Lovenox Code Status: Full Family Communication: None  at bedside. Disposition: CIR  Status is: Inpatient Remains inpatient appropriate because: Patient is medically stable for discharge once he is approved for rehab by  insurance           Consultants:  neurology   Subjective:  No significant events overnight as discussed with staff, patient denies any complaints.  Good appetite.  Objective: Vitals:   07/03/21 2345 07/04/21 0320 07/04/21 0800 07/04/21 1123  BP: 135/61 (!) 149/68 (!) 122/49 (!) 132/54  Pulse: 75 71 66 63  Resp: 20 19 17 20   Temp: 97.7 F (36.5 C) 97.6 F (36.4 C) 97.7 F (36.5 C) 97.6 F (36.4 C)  TempSrc: Oral Oral Axillary Oral  SpO2: 96% 99% 96% 98%    Intake/Output Summary (Last 24 hours) at 07/04/2021 1413 Last data filed at 07/04/2021 1002 Gross per 24 hour  Intake 240 ml  Output 700 ml  Net -460 ml   There were no vitals filed for this visit.  Examination:  Awake Alert, frail, pleasant interactive and follow commands appropriately Symmetrical Chest wall movement, Good air movement bilaterally, CTAB RRR,No Gallops,Rubs or new Murmurs, No Parasternal Heave +ve B.Sounds, Abd Soft, No tenderness, No rebound - guarding or rigidity. No Cyanosis, Clubbing or edema, No new Rash or bruise       Data Reviewed: I have personally reviewed following labs and imaging studies  CBC: Recent Labs  Lab 06/29/21 0051 06/30/21 0403 07/01/21 0250  WBC 7.9 8.0 8.1  NEUTROABS 4.4  --   --   HGB 12.1* 13.1 12.9*  HCT 36.7* 40.6 38.4*  MCV 91.8 89.6 89.1  PLT 237 249 716    Basic Metabolic Panel: Recent Labs  Lab 06/29/21 0051 06/30/21 0403 07/01/21 0250  NA 136 137 137  K 3.5 4.0 4.0  CL 103 103 104  CO2 25 23 24   GLUCOSE 123* 101* 111*  BUN 23 22 29*  CREATININE 1.59* 1.53* 1.54*  CALCIUM 9.8 9.6 9.6  MG  --   --  2.2    GFR: Estimated Creatinine Clearance: 25.1 mL/min (A) (by C-G formula based on SCr of 1.54 mg/dL (H)).  Liver Function Tests: Recent Labs  Lab 06/29/21 0051  AST 20  ALT 11  ALKPHOS 64  BILITOT 0.2*  PROT 7.0  ALBUMIN 3.2*    CBG: No results for input(s): GLUCAP in the last 168 hours.   No results found for this or  any previous visit (from the past 240 hour(s)).        Radiology Studies: No results found.      Scheduled Meds:   stroke: mapping our early stages of recovery book   Does not apply Once   aspirin EC  325 mg Oral Daily   atorvastatin  40 mg Oral Daily   clopidogrel  75 mg Oral Daily   cyanocobalamin  1,000 mcg Intramuscular Daily   Followed by   Derrill Memo ON 07/08/2021] cyanocobalamin  1,000 mcg Intramuscular Weekly   docusate sodium  200 mg Oral Daily   enoxaparin (LOVENOX) injection  30 mg Subcutaneous Q24H   levothyroxine  100 mcg Oral QAC breakfast   mirabegron ER  25 mg Oral QHS   pantoprazole  40 mg Oral Daily   Continuous Infusions:   LOS: 5 days        Phillips Climes, MD [facility]   To contact the attending provider between 7A-7P or the covering provider during after hours 7P-7A, please log into the web site [email or web address] and access using  universal [place] password for that web site. If you do not have the password, please call the hospital operator.  07/04/2021, 2:13 PM

## 2021-07-05 NOTE — Progress Notes (Signed)
Physical Therapy Treatment Patient Details Name: [name] MRN: [id number] DOB: 09-27-1928 Today's Date: 07/05/2021   History of Present Illness 88 y.o. male presented 06/29/21 after having a fall. 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  PMH significant of hypertension, CVA, CKD 3, hypothyroidism, colon cancer s/p hemicolectomy with chemotherapy, RA, and memory issues    PT Comments    Patient up in chair on arrival (and per RN had been up ~6 hours). Pt transferred with stedy lift from recliner to seat of stedy with +2 max assist due to pt's tendency to push backwards. Successive transfers from seat of stedy improved to point pt was +2 mod assist to stand upright. Patient fatigued and unable to progress to marching today.     Recommendations for follow up therapy are one component of a multi-disciplinary discharge planning process, led by the attending physician.  Recommendations may be updated based on patient status, additional functional criteria and insurance authorization.  Follow Up Recommendations  Acute inpatient rehab (3hours/day)     Assistance Recommended at Discharge Frequent or constant Supervision/Assistance  Patient can return home with the following Two people to help with walking and/or transfers;Two people to help with bathing/dressing/bathroom;Assistance with cooking/housework;Assistance with feeding;Direct supervision/assist for medications management;Direct supervision/assist for financial management;Assist for transportation   Equipment Recommendations  Other (comment) (TBD)    Recommendations for Other Services       Precautions / Restrictions Precautions Precautions: Fall Precaution Comments: has fallen >10x in past month (per daughter); sometimes 2x/day Restrictions Weight Bearing Restrictions: No     Mobility  Bed Mobility Overal bed mobility: Needs Assistance Bed Mobility: Sit to Supine     Supine to  sit: Max assist     General bed mobility comments: assist to raise legs onto bed    Transfers Overall transfer level: Needs assistance Equipment used: Ambulation equipment used Charlaine Dalton) Transfers: Sit to/from Stand, Bed to chair/wheelchair/BSC Sit to Stand: Max assist, +2 physical assistance           General transfer comment: posterior lean persists, but able to initiate forward lean by reaching forward for the bar of the stedy; Has power to rise; multimodal cues to come forward for upright standing; initial sit to stand requiring +2 Max assist for support due to the very heavy posterior bias; Performed serial sit <> stands x4 with notable improvements in anterior weight shifts with multimodal cues (last 3 transfers were from elevated seat of the stedy) Transfer via Lift Equipment: Stedy  Ambulation/Gait                   Stairs             Wheelchair Mobility    Modified Rankin (Stroke Patients Only) Modified Rankin (Stroke Patients Only) Pre-Morbid Rankin Score: No significant disability Modified Rankin: Severe disability     Balance Overall balance assessment: Needs assistance Sitting-balance support: No upper extremity supported, Feet unsupported Sitting balance-Leahy Scale: Fair Sitting balance - Comments: posterior imbalance Postural control: Posterior lean Standing balance support: Bilateral upper extremity supported, During functional activity, Single extremity supported                                Cognition Arousal/Alertness: Awake/alert Behavior During Therapy: WFL for tasks assessed/performed Overall Cognitive Status: No family/caregiver present to determine baseline cognitive functioning  General Comments: excellent participation using phone interpreter with apparent good understanding based on following commands        Exercises      General Comments General comments (skin  integrity, edema, etc.): video interpreter ipad not available (in use with another patient), however pt did well with phone interpreter via [facility] [employer]      Pertinent Vitals/Pain Pain Assessment Pain Assessment: Faces Faces Pain Scale: No hurt    Home Living                          Prior Function            PT Goals (current goals can now be found in the care plan section) Acute Rehab PT Goals Patient Stated Goal: to walk again, see family, garden Time For Goal Achievement: 07/14/21 Potential to Achieve Goals: Good Progress towards PT goals: Progressing toward goals    Frequency    Min 4X/week      PT Plan Current plan remains appropriate    Co-evaluation              AM-PAC PT "6 Clicks" Mobility   Outcome Measure  Help needed turning from your back to your side while in a flat bed without using bedrails?: A Lot Help needed moving from lying on your back to sitting on the side of a flat bed without using bedrails?: A Lot Help needed moving to and from a bed to a chair (including a wheelchair)?: Total Help needed standing up from a chair using your arms (e.g., wheelchair or bedside chair)?: Total Help needed to walk in hospital room?: Total Help needed climbing 3-5 steps with a railing? : Total 6 Click Score: 8    End of Session Equipment Utilized During Treatment: Gait belt Activity Tolerance: Patient tolerated treatment well Patient left: with call bell/phone within reach;in bed;with bed alarm set Nurse Communication: Mobility status;Need for lift equipment PT Visit Diagnosis: Unsteadiness on feet (R26.81);History of falling (Z91.81);Difficulty in walking, not elsewhere classified (R26.2)     Time: 4268-3419 PT Time Calculation (min) (ACUTE ONLY): 24 min  Charges:  $Gait Training: 23-37 mins                      Arby Barrette, PT Acute Rehabilitation Services  Pager [phone] Office [phone]    Rexanne Mano 07/05/2021,  4:22 PM

## 2021-07-05 NOTE — Progress Notes (Addendum)
PROGRESS NOTE    [name]  ZOX:[id number] DOB: [birth year] DOA: 06/28/2021 PCP: [name], DO   Chief Complaint  Patient presents with   Fall    Brief Narrative:    [name] is a 89 y.o. male with medical history significant of hypertension, CVA, CKD 3, hypothyroidism, colon cancer s/p hemicolectomy with chemotherapy, RA, and memory issues who presents with presented after having a fall, his work up significant for acute CVA.   Assessment & Plan:   Principal Problem:   CVA (cerebral vascular accident) [facility]) with intracranial vascular stenosis Active Problems:   Hypothyroidism   Essential hypertension   Rheumatoid arthritis ([place])   Memory loss   Frequent falls   Dyslipidemia   Kidney disease, chronic, stage III (moderate, EGFR 30-59 ml/min) (HCC)   Intracranial vascular stenosis   Prediabetes   Normocytic anemia   History of stroke  CVA (cerebral vascular accident) ([place]) with intracranial vascular stenosis- (present on admission) Patient presents after having recurrent falls.  Daughter noted signs of weakness on the left side 4-5 days ago.  -  MRI noted small acute perforator infarct of the right corona radiata and advanced right M1 segment stenosis.  Given unclear onset of symptoms patient was not a candidate for tPA. -Was seen by PT/OT, recommendation for CIR which has been consulted. -Neurology input greatly appreciated, recommendation for aspirin 325 mg and Plavix 75 mg oral daily X 3 months then plavix alone, he was on aspirin 81 mg prior to admission. -She is acceptable at 5.8 -LDL  is elevated at 102 , he is on gemfibrozil at home, started on atorvastatin 40 mg oral daily, gemfibrozil has been discontinued(will hold on discharge) due to increased risk of rhabdomyolysis with statin  Frequent falls -  frequency of falls likely due to new stroke as noted above.   -Fall precautions -PT/OT to evaluate and treat   Memory loss- (present on  admission) Daughter notes that the patient does get easily disoriented and confused. -Delirium precautions -TSH within normal limit,  -B12 is borderline low at 341, will start on IM supplements   Hypothyroidism- (present on admission) TSH was 1.835.  Home medication regimen includes levothyroxine 100 mcg daily. -Continue levothyroxine   History of stroke He previously had a stroke back in 2019 with small acute infarcts in both cerebral hemispheres.  Patient underwent cerebral angiogram with distal vertebrobasilar junction/proximal basilar artery stenosis angioplasty with Dr. Estanislado Pandy.    Normocytic anemia- (present on admission) Hemoglobin 12.1 which appears near patient's baseline. -Continue to monitor   Prediabetes- (present on admission) Last hemoglobin A1c was 6.3.  Patient not on any diabetic medications. -Follow-up repeat hemoglobin A1c   Kidney disease, chronic, stage III (moderate, EGFR 30-59 ml/min) (HCC)- (present on admission) On admission creatinine 1.59 with a BUN 23 which appears near patient's baseline. -Continue to monitor   Dyslipidemia- (present on admission) -DL is elevated at 102, he was started on statin, but he is on gemfibrozil at home, high risk for rhabdomyolysis, so gemfibrozil has been discontinued.   Rheumatoid arthritis ([place])- (present on admission) Methotrexate was discontinued after patient developed ulcerative stomatitis for which it appears it was discontinued on 08/07/2020 after being restarted 08/01/2020.   Essential hypertension- (present on admission) Blood pressure was elevated up to 170/68.  He is not on any medications for blood pressure and takes furosemide as needed for fluid. -Overall blood pressure is acceptable , no indication to start any new medications.  DVT prophylaxis: Lovenox Code Status: Full Family Communication: Discussed with daughter at bedside 07/05/2021 Disposition: CIR  Status is: Inpatient Remains inpatient  appropriate because: Patient is medically stable for discharge once he is approved for rehab by insurance           Consultants:  neurology   Subjective:  No significant events overnight as discussed with staff, patient denies any complaints.  Good appetite.  Objective: Vitals:   07/05/21 0300 07/05/21 0444 07/05/21 0806 07/05/21 1223  BP: 140/70 (!) 138/56 114/83 (!) 146/58  Pulse:  63 72 63  Resp: 18 20    Temp: 97.9 F (36.6 C) (!) 97.5 F (36.4 C) 97.9 F (36.6 C) 97.9 F (36.6 C)  TempSrc: Oral Oral Oral Oral  SpO2: 99%  98% 98%    Intake/Output Summary (Last 24 hours) at 07/05/2021 1343 Last data filed at 07/05/2021 1332 Gross per 24 hour  Intake 660 ml  Output 350 ml  Net 310 ml   There were no vitals filed for this visit.  Examination:  Awake, alert, pleasant, interactive, follow commands, pleasant, in no apparent distress.   Symmetrical Chest wall movement, Good air movement bilaterally, CTAB RRR,No Gallops,Rubs or new Murmurs, No Parasternal Heave +ve B.Sounds, Abd Soft, No tenderness, No rebound - guarding or rigidity. No Cyanosis, Clubbing or edema, No new Rash or bruise         Data Reviewed: I have personally reviewed following labs and imaging studies  CBC: Recent Labs  Lab 06/29/21 0051 06/30/21 0403 07/01/21 0250  WBC 7.9 8.0 8.1  NEUTROABS 4.4  --   --   HGB 12.1* 13.1 12.9*  HCT 36.7* 40.6 38.4*  MCV 91.8 89.6 89.1  PLT 237 249 202    Basic Metabolic Panel: Recent Labs  Lab 06/29/21 0051 06/30/21 0403 07/01/21 0250  NA 136 137 137  K 3.5 4.0 4.0  CL 103 103 104  CO2 '25 23 24  ' GLUCOSE 123* 101* 111*  BUN 23 22 29*  CREATININE 1.59* 1.53* 1.54*  CALCIUM 9.8 9.6 9.6  MG  --   --  2.2    GFR: Estimated Creatinine Clearance: 25.1 mL/min (A) (by C-G formula based on SCr of 1.54 mg/dL (H)).  Liver Function Tests: Recent Labs  Lab 06/29/21 0051  AST 20  ALT 11  ALKPHOS 64  BILITOT 0.2*  PROT 7.0  ALBUMIN 3.2*     CBG: No results for input(s): GLUCAP in the last 168 hours.   No results found for this or any previous visit (from the past 240 hour(s)).        Radiology Studies: No results found.      Scheduled Meds:   stroke: mapping our early stages of recovery book   Does not apply Once   aspirin EC  325 mg Oral Daily   atorvastatin  40 mg Oral Daily   clopidogrel  75 mg Oral Daily   cyanocobalamin  1,000 mcg Intramuscular Daily   Followed by   Derrill Memo ON 07/08/2021] cyanocobalamin  1,000 mcg Intramuscular Weekly   docusate sodium  200 mg Oral Daily   enoxaparin (LOVENOX) injection  30 mg Subcutaneous Q24H   levothyroxine  100 mcg Oral QAC breakfast   mirabegron ER  25 mg Oral QHS   pantoprazole  40 mg Oral Daily   Continuous Infusions:   LOS: 6 days        Phillips Climes, MD [facility]   To contact the attending provider between 7A-7P  or the covering provider during after hours 7P-7A, please log into the web site [email or web address] and access using universal [place] password for that web site. If you do not have the password, please call the hospital operator.  07/05/2021, 1:43 PM

## 2021-07-05 NOTE — Care Management Important Message (Signed)
Important Message  Patient Details  Name: [name] MRN: [id number] Date of Birth: 20-Dec-1928   Medicare Important Message Given:  Yes     Orbie Pyo 07/05/2021, 2:23 PM

## 2021-07-05 NOTE — Progress Notes (Signed)
Speech Language Pathology Treatment: Cognitive-Linquistic  Patient Details Name: Thorvald Orsino MRN: [id number] DOB: [birth year] Today's Date: 07/05/2021 Time: 7371-0626 SLP Time Calculation (min) (ACUTE ONLY): 19 min  Assessment / Plan / Recommendation Clinical Impression  Pt was seen with phone interpreter [phone]) as iPad was not available for video, but interpreter was having significant difficulty understanding pt over the phone. He described pt's speech as being "slurred." SLP therefore tried to give cues for pt to slow his speech, over articulate, and speak in shorter phrases. It is hard to determine if this helped his clarity through an interpreter, but it was noted that pt continued to speak in long utterances despite cues for shorter ones. SLP also provided cues for simple problem solving, helping the pt use call bell and reach out for help from RN when he was reporting pain. Session was ended when connection was lost with the interpreter. Will continue to follow acutely with recommendations for acute inpatient rehab upon discharge.   HPI HPI: 88 y.o. male presented 06/29/21 after having a fall. 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  PMH significant of hypertension, CVA, CKD 3, hypothyroidism, colon cancer s/p hemicolectomy with chemotherapy, RA, and memory issues      SLP Plan  Continue with current plan of care      Recommendations for follow up therapy are one component of a multi-disciplinary discharge planning process, led by the attending physician.  Recommendations may be updated based on patient status, additional functional criteria and insurance authorization.    Recommendations                   Follow Up Recommendations: Acute inpatient rehab (3hours/day) Assistance recommended at discharge: Intermittent Supervision/Assistance SLP Visit Diagnosis: Cognitive communication deficit (E70.350) Plan: Continue with current plan  of care           Osie Bond., M.A. [place] Acute Rehabilitation Services Pager [phone] Office [phone]  07/05/2021, 3:09 PM

## 2021-07-05 NOTE — Progress Notes (Signed)
Inpatient Rehabilitation Admissions Coordinator   I await insurance approval for possible Cir admit. I contacted his daughter, [name], by phone to review cost of care if approved by [facility] medicare. She is in agreement.  Danne Baxter, RN, MSN Rehab Admissions Coordinator [phone] 07/05/2021 1:06 PM

## 2021-07-06 NOTE — Telephone Encounter (Signed)
Orders were given to [place] to sign. Waiting for return

## 2021-07-06 NOTE — Progress Notes (Signed)
PROGRESS NOTE        PATIENT DETAILS Name: [name] Age: 88 y.o. Sex: male Date of Birth: 1928/06/27 Admit Date: 06/28/2021 Admitting Physician Norval Morton, MD KGU:RKYHC Koren Shiver, DO  Brief Summary: Patient is a 88 y.o.  male with history of prior CVA, CKD stage IIIa, HLD, HTN who presented with a fall that occurred approximately 5 days prior to this hospitalization-he was noted to have slight left-sided weakness-upon further evaluation he was found to have acute CVA.  See below for further details.   Significant Hospital events: 2/7>> admit for acute CVA.   Stroke work-up: 2/8>> MRI brain: Acute infarct in the right corona radiata 2/8>> MRA brain: Right M1 segment stenosis, chronic occlusion of the V4 segment, chronic high-grade narrowing of the left V4 segment.  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 2/8>> Echo: EF 60-65% 2/8>> LDL: 102 2/8>> A1c: 5.8  Significant microbiology data: 2/3>> COVID/influenza PCR: Negative  Procedures: None  Consults:  Neurology  Subjective: Lying comfortably in bed-denies any chest pain or shortness of breath.  Objective: Vitals: Blood pressure 133/60, pulse 70, temperature 97.6 F (36.4 C), temperature source Oral, resp. rate 18, SpO2 96 %.   Exam: Gen Exam:Alert awake-not in any distress HEENT:atraumatic, normocephalic Chest: B/L clear to auscultation anteriorly CVS:S1S2 regular Abdomen:soft non tender, non distended Extremities:no edema Neurology: Moves all 4 extremities-appears to have slight left-sided weakness. Skin: no rash  Pertinent Labs/Radiology: CBC Latest Ref Rng & Units 07/01/2021 06/30/2021 06/29/2021  WBC 4.0 - 10.5 K/uL 8.1 8.0 7.9  Hemoglobin 13.0 - 17.0 g/dL 12.9(L) 13.1 12.1(L)  Hematocrit 39.0 - 52.0 % 38.4(L) 40.6 36.7(L)  Platelets 150 - 400 K/uL 256 249  237    Lab Results  Component Value Date   NA 137 07/01/2021   K 4.0 07/01/2021   CL 104 07/01/2021   CO2 24 07/01/2021      Assessment/Plan: Acute right coronary radiata infarct: Likely due to small vessel disease-work-up as above.  Mild left-sided weakness persists.  Recommendations from neurology are for aspirin 325 mg and Plavix 75 mg for 3 months, then Plavix alone.  Continue Lipitor 40 mg on discharge.  Recommendations are to pursue 30-day cardiac event monitor in the outpatient setting to rule out A-fib.  HLD: On statin  HTN: BP stable-currently not on any antihypertensives  CKD stage IIIa: Close to baseline-follow periodically.  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  Memory loss: Probably has undiagnosed dementia-TSH stable-B12 borderline low.  Will need further work-up/neurocognitive testing to be done in the outpatient setting.  Hypothyroidism: Continue Synthroid  BMI: Estimated body mass index is 23.04 kg/m as calculated from the following:   Height as of 06/24/21: 5\' 3"  (1.6 m).   Weight as of 06/24/21: 59 kg.   Code status:   Code Status: Full Code   DVT Prophylaxis: enoxaparin (LOVENOX) injection 30 mg Start: 06/29/21 1000   Family Communication: None at bedside   Disposition Plan: Status is: Inpatient Remains inpatient appropriate because: Acute CVA-awaiting word from insurance regarding CIR admission.   Planned Discharge Destination:Rehabilitation facility   Diet: Diet Order             Diet Heart Room service appropriate? Yes;  Fluid consistency: Thin  Diet effective now                     Antimicrobial agents: Anti-infectives (From admission, onward)    None        MEDICATIONS: Scheduled Meds:   stroke: mapping our early stages of recovery book   Does not apply Once   aspirin EC  325 mg Oral Daily   atorvastatin  40 mg Oral Daily   clopidogrel  75  mg Oral Daily   cyanocobalamin  1,000 mcg Intramuscular Daily   Followed by   Derrill Memo ON 07/08/2021] cyanocobalamin  1,000 mcg Intramuscular Weekly   docusate sodium  200 mg Oral Daily   enoxaparin (LOVENOX) injection  30 mg Subcutaneous Q24H   levothyroxine  100 mcg Oral QAC breakfast   mirabegron ER  25 mg Oral QHS   pantoprazole  40 mg Oral Daily   Continuous Infusions: PRN Meds:.acetaminophen **OR** acetaminophen (TYLENOL) oral liquid 160 mg/5 mL **OR** acetaminophen, guaiFENesin, lip balm, loratadine, LORazepam, melatonin, polyvinyl alcohol, senna-docusate   I have personally reviewed following labs and imaging studies  LABORATORY DATA: CBC: Recent Labs  Lab 06/30/21 0403 07/01/21 0250  WBC 8.0 8.1  HGB 13.1 12.9*  HCT 40.6 38.4*  MCV 89.6 89.1  PLT 249 833    Basic Metabolic Panel: Recent Labs  Lab 06/30/21 0403 07/01/21 0250  NA 137 137  K 4.0 4.0  CL 103 104  CO2 23 24  GLUCOSE 101* 111*  BUN 22 29*  CREATININE 1.53* 1.54*  CALCIUM 9.6 9.6  MG  --  2.2    GFR: Estimated Creatinine Clearance: 25.1 mL/min (A) (by C-G formula based on SCr of 1.54 mg/dL (H)).  Liver Function Tests: No results for input(s): AST, ALT, ALKPHOS, BILITOT, PROT, ALBUMIN in the last 168 hours. No results for input(s): LIPASE, AMYLASE in the last 168 hours. No results for input(s): AMMONIA in the last 168 hours.  Coagulation Profile: No results for input(s): INR, PROTIME in the last 168 hours.  Cardiac Enzymes: Recent Labs  Lab 06/29/21 2154  CKTOTAL 83    BNP (last 3 results) No results for input(s): PROBNP in the last 8760 hours.  Lipid Profile: No results for input(s): CHOL, HDL, LDLCALC, TRIG, CHOLHDL, LDLDIRECT in the last 72 hours.  Thyroid Function Tests: No results for input(s): TSH, T4TOTAL, FREET4, T3FREE, THYROIDAB in the last 72 hours.  Anemia Panel: No results for input(s): VITAMINB12, FOLATE, FERRITIN, TIBC, IRON, RETICCTPCT in the last 72  hours.  Urine analysis:    Component Value Date/Time   COLORURINE YELLOW 06/29/2021 [place] 06/29/2021 0434   LABSPEC 1.012 06/29/2021 0434   PHURINE 5.0 06/29/2021 0434   GLUCOSEU NEGATIVE 06/29/2021 0434   HGBUR NEGATIVE 06/29/2021 0434   HGBUR negative 03/17/2010 1010   BILIRUBINUR NEGATIVE 06/29/2021 0434   KETONESUR NEGATIVE 06/29/2021 0434   PROTEINUR NEGATIVE 06/29/2021 0434   UROBILINOGEN 0.2 03/17/2010 1010   NITRITE NEGATIVE 06/29/2021 0434   LEUKOCYTESUR NEGATIVE 06/29/2021 0434    Sepsis Labs: Lactic Acid, Venous    Component Value Date/Time   LATICACIDVEN 1.5 08/07/2020 1608    MICROBIOLOGY: No results found for this or any previous visit (from the past 240 hour(s)).  RADIOLOGY STUDIES/RESULTS: No results found.   LOS: 7 days   Oren Binet, MD  [facility]    To contact the attending provider between 7A-7P or the covering provider during after hours 7P-7A, please log into  the web site [email or web address] and access using universal [place] password for that web site. If you do not have the password, please call the hospital operator.  07/06/2021, 2:45 PM

## 2021-07-06 NOTE — Progress Notes (Signed)
Occupational Therapy Treatment Patient Details Name: [name] MRN: [id number] DOB: Sep 20, 1928 Today's Date: 07/06/2021   History of present illness 88 y.o. male presented 06/29/21 after having a fall. 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 Pt participated in activity standing in [place] after that with forward reaching activities to address balance, functional reach and activity tolerance. Pt positioned dat sinkl for grooming tasks with Sup to to reach FF with R UE to turn on water at sink, wash face, hands and min A to reach to turn water off with L UE. Noting better forward leans in sitting and to initiate standing when done within a functional activity (ie, at the sink), and with a "target" to move towards; Noting needing less assist with repetitions of functional movement patterns. Pt noted to be moving slower today, moving sit to stand was more challenging; he remains motivated to improve. Continue to recommend acute inpatient rehab (AIR) for post-acute therapy needs; the incr time in daily therapy, multidisciplinary approach to therapy for functional gains, and intensity will give him the massed practice and repetition that he responds well to; Family is supportive as well, and post-acute rehab at AIR will maximize independence and safety with ADLs and ADL mobility, and assist with caregiver level of assist.   Recommendations for follow up therapy are one component of a multi-disciplinary discharge planning process, led by the attending physician.  Recommendations may be updated based on patient status, additional functional criteria and insurance authorization.    Follow Up Recommendations  Acute inpatient rehab (3hours/day)    Assistance Recommended at Discharge  Frequent or constant Supervision/Assistance  Patient can return home with the following  A lot of help with bathing/dressing/bathroom;Two people to help with walking and/or transfers;Direct supervision/assist for medications management;Direct supervision/assist for financial management;Assist for transportation;Help with stairs or ramp for entrance   Equipment Recommendations  Tub/shower seat;Other (comment) (TBD at next venue of care)    Recommendations for Other Services      Precautions / Restrictions Precautions Precautions: Fall Precaution Comments: has fallen >10x in past month (per daughter); sometimes 2x/day Restrictions Weight Bearing Restrictions: No       Mobility Bed Mobility               General bed mobility comments: pt in chair up with PT uon arrival    Transfers Overall transfer level: Needs assistance Equipment used: Ambulation equipment used Transfers: Sit to/from Stand, Bed to chair/wheelchair/BSC Sit to Stand: Max assist, Mod assist, +2 safety/equipment, +2 physical assistance Stand pivot transfers: Max assist, +2 physical assistance         General transfer comment: posterior lean persists, but able to initiate forward lean by reaching forward for the bar of the stedy; Has power to rise; multimodal cues to come forward for upright standing; initial sit to stand requiring +2 Max assist for support due to the very heavy posterior bias; Performed serial sit <> stands x4 with notable improvements in anterior weight shifts with multimodal cues (last 3 transfers were from elevated seat of the stedy)     Balance Overall balance assessment: Needs assistance Sitting-balance support: No upper extremity supported, Feet unsupported Sitting balance-Leahy Scale: Fair Sitting balance - Comments: posterior imbalance Postural control: Posterior lean Standing balance support: Bilateral upper extremity supported, During  functional activity, Single extremity  supported Standing balance-Leahy Scale: Poor                             ADL either performed or assessed with clinical judgement   ADL       Grooming: Wash/dry face;Wash/dry hands;Standing Grooming Details (indicate cue type and reason): Standing with Stedy at sink Upper Body Bathing: Minimal assistance;Standing Upper Body Bathing Details (indicate cue type and reason): standing at sink in [place]     Upper Body Dressing : Minimal assistance;Sitting       Toilet Transfer: Maximal assistance;+2 for safety/equipment Toilet Transfer Details (indicate cue type and reason): simulated, used Stedy for standing, 4 trials sit - stand from chair to [employer] and Hygiene: Total assistance       Functional mobility during ADLs: Maximal assistance;+2 for physical assistance      Extremity/Trunk Assessment Upper Extremity Assessment Upper Extremity Assessment: Generalized weakness LUE Deficits / Details: L UE AROM impaired, pt's daughter reports that pt has had decreased ROM at baseline, arthritis   Lower Extremity Assessment Lower Extremity Assessment: Defer to PT evaluation   Cervical / Trunk Assessment Cervical / Trunk Assessment: Other exceptions Cervical / Trunk Exceptions: posterior bias    Vision Baseline Vision/History: 1 Wears glasses Ability to See in Adequate Light: 0 Adequate Patient Visual Report: No change from baseline     Perception     Praxis      Cognition Arousal/Alertness: Awake/alert Behavior During Therapy: WFL for tasks assessed/performed Overall Cognitive Status: No family/caregiver present to determine baseline cognitive functioning                               Problem Solving: Decreased initiation, Slow processing General Comments: Noting slower initiation, and incr time to follow through with tasks; Even so, pt will change the way he is approaching a task to be able to complete it (i.e. reached  under bar of stedy to get teh wascloth when it was difficult to reach over); Continued excellent motivation and participation; Able to verbalize that he was able to stand better two sessions ago than today        Exercises      Shoulder Instructions       General Comments Video interpreter, Maudie Mercury [phone]) facilitated communication this session    Pertinent Vitals/ Pain       Pain Assessment Pain Assessment: Faces Faces Pain Scale: Hurts a little bit Pain Location: LUE Pain Descriptors / Indicators: Sore Pain Intervention(s): Monitored during session, Repositioned  Home Living                                          Prior Functioning/Environment              Frequency  Min 2X/week        Progress Toward Goals  OT Goals(current goals can now be found in the care plan section)  Progress towards OT goals: Progressing toward goals     Plan Discharge plan remains appropriate;Frequency remains appropriate    Co-evaluation    PT/OT/SLP Co-Evaluation/Treatment: Yes Reason for Co-Treatment: Complexity of the patient's impairments (multi-system involvement);For patient/therapist safety;To address functional/ADL transfers PT goals addressed during session: Mobility/safety with mobility OT goals addressed during session: Proper use  of [employer] equipment and DME;ADL's and self-care      AM-PAC OT "6 Clicks" Daily Activity     Outcome Measure   Help from another person eating meals?: None Help from another person taking care of personal grooming?: A Little Help from another person toileting, which includes using toliet, bedpan, or urinal?: Total Help from another person bathing (including washing, rinsing, drying)?: A Little Help from another person to put on and taking off regular upper body clothing?: A Little Help from another person to put on and taking off regular lower body clothing?: Total 6 Click Score: 15    End of Session Equipment  Utilized During Treatment: Gait belt;Other (comment) Charlaine Dalton)  OT Visit Diagnosis: Unsteadiness on feet (R26.81);Other abnormalities of gait and mobility (R26.89);History of falling (Z91.81);Muscle weakness (generalized) (M62.81);Other symptoms and signs involving cognitive function   Activity Tolerance Patient limited by fatigue   Patient Left in chair;with call bell/phone within reach;with chair alarm set   Nurse Communication          Time: 1024-1050 OT Time Calculation (min): 26 min  Charges: OT General Charges $OT Visit: 1 Visit OT Treatments $Therapeutic Activity: 8-22 mins   Britt Bottom 07/06/2021, 2:17 PM

## 2021-07-06 NOTE — Progress Notes (Signed)
Physical Therapy Treatment Patient Details Name: [name] MRN: [id number] DOB: [birth year] Today's Date: 07/06/2021   History of Present Illness 88 y.o. male presented 06/29/21 after having a fall. 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  PMH significant of hypertension, CVA, CKD 3, hypothyroidism, colon cancer s/p hemicolectomy with chemotherapy, RA, and memory issues    PT Comments    Continuing work on functional mobility and activity tolerance;  Session focused on transfer training;   Started with stand pivot transfer bed to recliner, needing Max assist (2 persons present, but could be done with 1 person assist), and multimodal cues for anterior weight  shift and timing of rise;   Initiated straight sit<>stand training with standing from recliner to RW place directly adjacent to and facing the bed with rail up (in an effort to decr fear of falling forward), needed Max assist, and tended to reach up for bedrail and push instead of pusing from recliner (possible that instructions were unclear with video interpreter);   Opted to work from the stedy after that (which allows for practice from low seat of recliner and high seat of stedy seat); with reps of sit to stand from varying surfaces, including reps to stand from recliner surface and high stedy seat; Able to progress to mod assist of 1 with reps; Noting better forward leans in sitting and to initiate standing when done within a functional activity (ie, at the sink), and with a "target" to move towards; Noting needing less assist with repetitions of functional movement patterns;    Kuper is participating well, and makes an excellent effort at all tasks asked of him; he was able to verbalize to the video interpreter differences between PT sessions, moving slower today, moving sit to stand was more challenging; he remains motivated to improve;   Continue to recommend acute inpatient rehab (AIR)  for post-acute therapy needs; the incr time in daily therapy, multidisciplinary approach to therapy for functional gains, and intensity will give him the massed practice and repetition that he responds well to; Family is supportive as well, and post-acute rehab at [facility] will maximize independence and safety with mobility and ADLs, and assist with caregiver load once he transitions home.   Recommendations for follow up therapy are one component of a multi-disciplinary discharge planning process, led by the attending physician.  Recommendations may be updated based on patient status, additional functional criteria and insurance authorization.  Follow Up Recommendations  Acute inpatient rehab (3hours/day)     Assistance Recommended at Discharge Frequent or constant Supervision/Assistance  Patient can return home with the following Two people to help with walking and/or transfers;Two people to help with bathing/dressing/bathroom;Assistance with cooking/housework;Assistance with feeding;Direct supervision/assist for medications management;Direct supervision/assist for financial management;Assist for transportation   Equipment Recommendations  Other (comment) (TBD)    Recommendations for Other Services       Precautions / Restrictions Precautions Precautions: Fall Precaution Comments: has fallen >10x in past month (per daughter); sometimes 2x/day     Mobility  Bed Mobility Overal bed mobility: Needs Assistance Bed Mobility: Supine to Sit     Supine to sit: Max assist     General bed mobility comments: Stiff, and needing Max assist for support due to posterior lean at initial sit    Transfers Overall transfer level: Needs assistance Equipment used: Ambulation equipment used (Stedy; and one trial standing from recliner to RW, using bed right in front of recliner to decr fear of  falling) Transfers: Sit to/from Stand, Bed to chair/wheelchair/BSC Sit to Stand: Max assist, Mod assist, +2  safety/equipment, +2 physical assistance Stand pivot transfers: Max assist, +2 physical assistance         General transfer comment: posterior lean persists, but able to initiate forward lean by reaching forward for the bar of the stedy; Has power to rise; multimodal cues to come forward for upright standing; initial sit to stand requiring +2 Max assist for support due to the very heavy posterior bias; Performed serial sit <> stands x4 with notable improvements in anterior weight shifts with multimodal cues (last 3 transfers were from elevated seat of the stedy) Transfer via Lift Equipment: Stedy  Ambulation/Gait             Pre-gait activities: Unable to practice today due to difficulty maintaining stand     Marine scientist Rankin (Stroke Patients Only) Modified Rankin (Stroke Patients Only) Pre-Morbid Rankin Score: No significant disability Modified Rankin: Severe disability     Balance Overall balance assessment: Needs assistance   Sitting balance-Leahy Scale: Fair Sitting balance - Comments: posterior imbalance Postural control: Posterior lean Standing balance support: Bilateral upper extremity supported, During functional activity, Single extremity supported Standing balance-Leahy Scale: Poor Standing balance comment: static standing within stedy approximately with mod assist; held approximately 10 seconds before needing to sit                            Cognition Arousal/Alertness: Awake/alert Behavior During Therapy: WFL for tasks assessed/performed Overall Cognitive Status: No family/caregiver present to determine baseline cognitive functioning                               Problem Solving: Decreased initiation, Slow processing General Comments: Noting slower initiation, and incr time to follow through with tasks; Even so, pt will change the way he is approaching a task to be able to complete it  (i.e. reached under bar of stedy to get teh wascloth when it was difficult to reach over); Continued excellent motivation and participation; Able to verbalize that he was able to stand better two sessions ago than today        Exercises Other Exercises Other Exercises: Trunk stretching into rotation and upper trunk extension; in high seat position in stedy    General Comments General comments (skin integrity, edema, etc.): Video interpreter, Maudie Mercury [phone]) facilitated communication this session      Pertinent Vitals/Pain Pain Assessment Pain Assessment: Faces Faces Pain Scale: Hurts a little bit Pain Location: LUE Pain Descriptors / Indicators: Sore Pain Intervention(s): Monitored during session    Home Living                          Prior Function            PT Goals (current goals can now be found in the care plan section) Acute Rehab PT Goals Patient Stated Goal: to walk again, see family, garden PT Goal Formulation: With patient Time For Goal Achievement: 07/14/21 Potential to Achieve Goals: Good Progress towards PT goals: Progressing toward goals (Slowly)    Frequency    Min 4X/week      PT Plan Current plan remains appropriate    Co-evaluation PT/OT/SLP Co-Evaluation/Treatment: Yes Reason for Co-Treatment: Necessary  to address cognition/behavior during functional activity;For patient/therapist safety;To address functional/ADL transfers PT goals addressed during session: Mobility/safety with mobility        AM-PAC PT "6 Clicks" Mobility   Outcome Measure  Help needed turning from your back to your side while in a flat bed without using bedrails?: A Lot Help needed moving from lying on your back to sitting on the side of a flat bed without using bedrails?: A Lot Help needed moving to and from a bed to a chair (including a wheelchair)?: Total Help needed standing up from a chair using your arms (e.g., wheelchair or bedside chair)?: Total Help  needed to walk in hospital room?: Total Help needed climbing 3-5 steps with a railing? : Total 6 Click Score: 8    End of Session Equipment Utilized During Treatment: Gait belt Activity Tolerance: Patient tolerated treatment well Patient left: in chair;with call bell/phone within reach;with chair alarm set Nurse Communication: Mobility status;Need for lift equipment PT Visit Diagnosis: Unsteadiness on feet (R26.81);History of falling (Z91.81);Difficulty in walking, not elsewhere classified (R26.2)     Time: 1000-1107 PT Time Calculation (min) (ACUTE ONLY): 67 min  Charges:  $Therapeutic Activity: 23-37 mins                     Roney Marion, PT  Acute Rehabilitation Services Pager [phone] Office [place] 07/06/2021, 11:32 AM

## 2021-07-06 NOTE — Progress Notes (Signed)
Inpatient Rehabilitation Admissions Coordinator   I await insurance approval after peer to peer complete with Dr Sloan Leiter with Bernadene Bell MD. Updated therapy notes faxed per their request.  Danne Baxter, RN, MSN Rehab Admissions Coordinator [phone] 07/06/2021 3:14 PM

## 2021-07-07 ENCOUNTER — Encounter (HOSPITAL_COMMUNITY): Payer: Self-pay | Admitting: Physical Medicine & Rehabilitation

## 2021-07-07 ENCOUNTER — Telehealth: Payer: Medicare Other

## 2021-07-07 ENCOUNTER — Other Ambulatory Visit: Payer: Self-pay

## 2021-07-07 ENCOUNTER — Inpatient Hospital Stay (HOSPITAL_COMMUNITY)
Admission: RE | Admit: 2021-07-07 | Discharge: 2021-07-29 | DRG: 057 | Disposition: A | Discharge status: Home Health | Payer: Medicare Other | Source: Intra-hospital | Attending: Physical Medicine & Rehabilitation | Admitting: Physical Medicine & Rehabilitation

## 2021-07-07 DIAGNOSIS — M24552 Contracture, left hip: Secondary | ICD-10-CM | POA: Diagnosis not present

## 2021-07-07 DIAGNOSIS — I771 Stricture of artery: Secondary | ICD-10-CM | POA: Diagnosis present

## 2021-07-07 DIAGNOSIS — M4800 Spinal stenosis, site unspecified: Secondary | ICD-10-CM | POA: Diagnosis not present

## 2021-07-07 DIAGNOSIS — M5136 Other intervertebral disc degeneration, lumbar region: Secondary | ICD-10-CM | POA: Diagnosis present

## 2021-07-07 DIAGNOSIS — I10 Essential (primary) hypertension: Secondary | ICD-10-CM | POA: Diagnosis not present

## 2021-07-07 DIAGNOSIS — I129 Hypertensive chronic kidney disease with stage 1 through stage 4 chronic kidney disease, or unspecified chronic kidney disease: Secondary | ICD-10-CM | POA: Diagnosis not present

## 2021-07-07 DIAGNOSIS — R498 Other voice and resonance disorders: Secondary | ICD-10-CM | POA: Diagnosis present

## 2021-07-07 DIAGNOSIS — R351 Nocturia: Secondary | ICD-10-CM | POA: Diagnosis present

## 2021-07-07 DIAGNOSIS — Z888 Allergy status to other drugs, medicaments and biological substances status: Secondary | ICD-10-CM | POA: Diagnosis not present

## 2021-07-07 DIAGNOSIS — I69393 Ataxia following cerebral infarction: Secondary | ICD-10-CM | POA: Diagnosis not present

## 2021-07-07 DIAGNOSIS — N1832 Chronic kidney disease, stage 3b: Secondary | ICD-10-CM | POA: Diagnosis not present

## 2021-07-07 DIAGNOSIS — R296 Repeated falls: Secondary | ICD-10-CM | POA: Diagnosis present

## 2021-07-07 DIAGNOSIS — N3281 Overactive bladder: Secondary | ICD-10-CM | POA: Diagnosis not present

## 2021-07-07 DIAGNOSIS — E039 Hypothyroidism, unspecified: Secondary | ICD-10-CM | POA: Diagnosis present

## 2021-07-07 DIAGNOSIS — I6330 Cerebral infarction due to thrombosis of unspecified cerebral artery: Secondary | ICD-10-CM

## 2021-07-07 DIAGNOSIS — Z85038 Personal history of other malignant neoplasm of large intestine: Secondary | ICD-10-CM | POA: Diagnosis not present

## 2021-07-07 DIAGNOSIS — M25512 Pain in left shoulder: Secondary | ICD-10-CM

## 2021-07-07 DIAGNOSIS — I69392 Facial weakness following cerebral infarction: Secondary | ICD-10-CM | POA: Diagnosis not present

## 2021-07-07 DIAGNOSIS — G8911 Acute pain due to trauma: Secondary | ICD-10-CM

## 2021-07-07 DIAGNOSIS — I69354 Hemiplegia and hemiparesis following cerebral infarction affecting left non-dominant side: Principal | ICD-10-CM

## 2021-07-07 DIAGNOSIS — I639 Cerebral infarction, unspecified: Secondary | ICD-10-CM | POA: Diagnosis present

## 2021-07-07 DIAGNOSIS — E538 Deficiency of other specified B group vitamins: Secondary | ICD-10-CM | POA: Diagnosis not present

## 2021-07-07 DIAGNOSIS — G8929 Other chronic pain: Secondary | ICD-10-CM | POA: Diagnosis present

## 2021-07-07 DIAGNOSIS — R7303 Prediabetes: Secondary | ICD-10-CM | POA: Diagnosis present

## 2021-07-07 DIAGNOSIS — Z880 Allergy status to penicillin: Secondary | ICD-10-CM | POA: Diagnosis not present

## 2021-07-07 DIAGNOSIS — K5901 Slow transit constipation: Secondary | ICD-10-CM | POA: Diagnosis present

## 2021-07-07 DIAGNOSIS — I6300 Cerebral infarction due to thrombosis of unspecified precerebral artery: Secondary | ICD-10-CM | POA: Diagnosis not present

## 2021-07-07 DIAGNOSIS — M069 Rheumatoid arthritis, unspecified: Secondary | ICD-10-CM | POA: Diagnosis not present

## 2021-07-07 DIAGNOSIS — N183 Chronic kidney disease, stage 3 unspecified: Secondary | ICD-10-CM | POA: Diagnosis not present

## 2021-07-07 DIAGNOSIS — N3941 Urge incontinence: Secondary | ICD-10-CM | POA: Diagnosis present

## 2021-07-07 DIAGNOSIS — E785 Hyperlipidemia, unspecified: Secondary | ICD-10-CM | POA: Diagnosis not present

## 2021-07-07 DIAGNOSIS — Z886 Allergy status to analgesic agent status: Secondary | ICD-10-CM | POA: Diagnosis not present

## 2021-07-07 MED ORDER — DIPHENHYDRAMINE HCL 12.5 MG/5ML PO ELIX: 12.5000 mg | ORAL_SOLUTION | Freq: Four times a day (QID) | ORAL | Status: DC | PRN

## 2021-07-07 MED ORDER — GUAIFENESIN-DM 100-10 MG/5ML PO SYRP: 5.0000 mL | ORAL_SOLUTION | Freq: Four times a day (QID) | ORAL | Status: DC | PRN

## 2021-07-07 MED ORDER — GUAIFENESIN ER 600 MG PO TB12
600.0000 mg | ORAL_TABLET | Freq: Two times a day (BID) | ORAL | Status: DC | PRN
  Filled 2021-07-07: qty 1

## 2021-07-07 MED ORDER — PROCHLORPERAZINE MALEATE 5 MG PO TABS
5.0000 mg | ORAL_TABLET | Freq: Four times a day (QID) | ORAL | Status: DC | PRN
  Administered 2021-07-28: 09:00:00 5 mg via ORAL
  Filled 2021-07-07: qty 1

## 2021-07-07 MED ORDER — ASPIRIN 325 MG PO TBEC: 325.0000 mg | DELAYED_RELEASE_TABLET | Freq: Every day | ORAL | 0 refills | Status: DC

## 2021-07-07 MED ORDER — MELATONIN 3 MG PO TABS
3.0000 mg | ORAL_TABLET | Freq: Every evening | ORAL | Status: DC | PRN
Start: 2021-07-07 — End: 2021-07-29
  Administered 2021-07-07 – 2021-07-28 (×15): 3 mg via ORAL
  Filled 2021-07-07 (×15): qty 1

## 2021-07-07 MED ORDER — BISACODYL 10 MG RE SUPP
10.0000 mg | Freq: Every day | RECTAL | Status: DC | PRN
  Administered 2021-07-16: 10 mg via RECTAL
  Filled 2021-07-07: qty 1

## 2021-07-07 MED ORDER — POLYETHYLENE GLYCOL 3350 17 G PO PACK
17.0000 g | PACK | Freq: Every day | ORAL | Status: DC | PRN
  Administered 2021-07-09 – 2021-07-23 (×5): 17 g via ORAL
  Filled 2021-07-07 (×5): qty 1

## 2021-07-07 MED ORDER — MIRABEGRON ER 25 MG PO TB24
25.0000 mg | ORAL_TABLET | Freq: Every day | ORAL | Status: DC
  Administered 2021-07-07 – 2021-07-28 (×22): 25 mg via ORAL
  Filled 2021-07-07 (×22): qty 1

## 2021-07-07 MED ORDER — TRAZODONE HCL 50 MG PO TABS: 25.0000 mg | ORAL_TABLET | Freq: Every evening | ORAL | Status: DC | PRN

## 2021-07-07 MED ORDER — CLOPIDOGREL BISULFATE 75 MG PO TABS: 75.0000 mg | ORAL_TABLET | Freq: Every day | ORAL | Status: DC

## 2021-07-07 MED ORDER — ATORVASTATIN CALCIUM 40 MG PO TABS
40.0000 mg | ORAL_TABLET | Freq: Every day | ORAL | Status: DC
  Administered 2021-07-08 – 2021-07-28 (×21): 40 mg via ORAL
  Filled 2021-07-07 (×23): qty 1

## 2021-07-07 MED ORDER — ENOXAPARIN SODIUM 40 MG/0.4ML IJ SOSY
40.0000 mg | PREFILLED_SYRINGE | INTRAMUSCULAR | Status: DC
  Administered 2021-07-08: 40 mg via SUBCUTANEOUS
  Filled 2021-07-07: qty 0.4

## 2021-07-07 MED ORDER — PROCHLORPERAZINE 25 MG RE SUPP: 12.5000 mg | Freq: Four times a day (QID) | RECTAL | Status: DC | PRN

## 2021-07-07 MED ORDER — ATORVASTATIN CALCIUM 40 MG PO TABS: 40.0000 mg | ORAL_TABLET | Freq: Every day | ORAL | Status: DC

## 2021-07-07 MED ORDER — ACETAMINOPHEN 325 MG PO TABS: 325.0000 mg | ORAL_TABLET | ORAL | Status: DC | PRN

## 2021-07-07 MED ORDER — POLYVINYL ALCOHOL 1.4 % OP SOLN: 1.0000 [drp] | Freq: Every day | OPHTHALMIC | Status: DC | PRN

## 2021-07-07 MED ORDER — PANTOPRAZOLE SODIUM 40 MG PO TBEC: 40.0000 mg | DELAYED_RELEASE_TABLET | Freq: Every day | ORAL | Status: DC

## 2021-07-07 MED ORDER — LORATADINE 10 MG PO TABS: 10.0000 mg | ORAL_TABLET | Freq: Every day | ORAL | Status: DC | PRN

## 2021-07-07 MED ORDER — ACETAMINOPHEN 500 MG PO TABS
500.0000 mg | ORAL_TABLET | Freq: Every day | ORAL | Status: DC
  Administered 2021-07-09 – 2021-07-28 (×19): 500 mg via ORAL
  Filled 2021-07-07 (×22): qty 1

## 2021-07-07 MED ORDER — LEVOTHYROXINE SODIUM 100 MCG PO TABS
100.0000 ug | ORAL_TABLET | Freq: Every day | ORAL | Status: DC
  Administered 2021-07-08 – 2021-07-29 (×22): 100 ug via ORAL
  Filled 2021-07-07 (×22): qty 1

## 2021-07-07 MED ORDER — PROCHLORPERAZINE EDISYLATE 10 MG/2ML IJ SOLN: 5.0000 mg | Freq: Four times a day (QID) | INTRAMUSCULAR | Status: DC | PRN

## 2021-07-07 MED ORDER — DOCUSATE SODIUM 100 MG PO CAPS
200.0000 mg | ORAL_CAPSULE | Freq: Every day | ORAL | Status: DC
  Administered 2021-07-08 – 2021-07-29 (×22): 200 mg via ORAL
  Filled 2021-07-07 (×22): qty 2

## 2021-07-07 MED ORDER — BLISTEX MEDICATED EX OINT
1.0000 "application " | TOPICAL_OINTMENT | CUTANEOUS | Status: DC | PRN
  Filled 2021-07-07: qty 6.3

## 2021-07-07 MED ORDER — CYANOCOBALAMIN 1000 MCG/ML IJ SOLN
1000.0000 ug | INTRAMUSCULAR | Status: DC
  Administered 2021-07-08 – 2021-07-29 (×4): 1000 ug via INTRAMUSCULAR
  Filled 2021-07-07 (×4): qty 1

## 2021-07-07 MED ORDER — TROLAMINE SALICYLATE 10 % EX CREA
TOPICAL_CREAM | Freq: Three times a day (TID) | CUTANEOUS | Status: DC
  Filled 2021-07-07: qty 85

## 2021-07-07 MED ORDER — CYANOCOBALAMIN 1000 MCG/ML IJ SOLN: 1000.0000 ug | INTRAMUSCULAR | 0 refills | Status: DC

## 2021-07-07 MED ORDER — FLEET ENEMA 7-19 GM/118ML RE ENEM
1.0000 | ENEMA | Freq: Once | RECTAL | Status: DC | PRN
Start: 2021-07-07 — End: 2021-07-29

## 2021-07-07 MED ORDER — ALUM & MAG HYDROXIDE-SIMETH 200-200-20 MG/5ML PO SUSP
30.0000 mL | ORAL | Status: DC | PRN
Start: 2021-07-07 — End: 2021-07-29

## 2021-07-07 MED ORDER — CAPSAICIN 0.075 % EX CREA
1.0000 "application " | TOPICAL_CREAM | Freq: Three times a day (TID) | CUTANEOUS | Status: DC
  Administered 2021-07-07 – 2021-07-29 (×66): 1 via TOPICAL
  Filled 2021-07-07 (×3): qty 57

## 2021-07-07 MED ORDER — PANTOPRAZOLE SODIUM 40 MG PO TBEC
40.0000 mg | DELAYED_RELEASE_TABLET | Freq: Every day | ORAL | Status: DC
  Administered 2021-07-08 – 2021-07-29 (×22): 40 mg via ORAL
  Filled 2021-07-07 (×22): qty 1

## 2021-07-07 MED ORDER — ASPIRIN EC 325 MG PO TBEC
325.0000 mg | DELAYED_RELEASE_TABLET | Freq: Every day | ORAL | Status: DC
  Administered 2021-07-08 – 2021-07-29 (×22): 325 mg via ORAL
  Filled 2021-07-07 (×22): qty 1

## 2021-07-07 MED ORDER — CLOPIDOGREL BISULFATE 75 MG PO TABS
75.0000 mg | ORAL_TABLET | Freq: Every day | ORAL | Status: DC
  Administered 2021-07-08 – 2021-07-29 (×22): 75 mg via ORAL
  Filled 2021-07-07 (×22): qty 1

## 2021-07-07 NOTE — Progress Notes (Signed)
Inpatient Rehabilitation Admissions Coordinator   I have received insurance approval for [facility] admit. I spoke with his daughter, [name], by phone and she is aware and in agreement. I will verify bed availability and let the acute team and TOC aware.  Danne Baxter, RN, MSN Rehab Admissions Coordinator [phone] 07/07/2021 8:23 AM

## 2021-07-07 NOTE — Progress Notes (Signed)
Inpatient Rehabilitation Admission Medication Review by a Pharmacist  A complete drug regimen review was completed for this patient to identify any potential clinically significant medication issues.  High Risk Drug Classes Is patient taking? Indication by Medication  Antipsychotic Yes Compazine- N/V  Anticoagulant Yes Lovenox- VTE prophylaxis  Antibiotic No   Opioid No   Antiplatelet Yes DAPT (asa/plavix)- CVA prophylaxis X 72months f/b asa alone  Hypoglycemics/insulin No   Vasoactive Medication No   Chemotherapy No   Other Yes Lipitor- HLD Synthroid- hypothyroidism Myrbetriq- Urinary frequency Protonix- GERD     Type of Medication Issue Identified Description of Issue Recommendation(s)  Drug Interaction(s) (clinically significant)     Duplicate Therapy     Allergy     No Medication Administration End Date  DAPT (asa/plavix)- CVA prophylaxis X 13months f/b asa alone (end date for Plavix is 09/22/2021) Plavix end date is 09/22/2021  Incorrect Dose     Additional Drug Therapy Needed     Significant med changes from prior encounter (inform family/care partners about these prior to discharge).    Other  PTA meds: Lasix, Lopid Restart PTA meds when and if clinically necessary during [facility] admission or at time of discharge    Clinically significant medication issues were identified that warrant physician communication and completion of prescribed/recommended actions by midnight of the next day:  No  Name of provider notified for urgent issues identified:   Provider Method of Notification:     Pharmacist comments:   Time spent performing this drug regimen review (minutes):  30   Tracer Gutridge BS, PharmD, BCPS Clinical Pharmacist 07/07/2021 3:16 PM

## 2021-07-07 NOTE — Discharge Summary (Signed)
PATIENT DETAILS Name: [name] Age: 88 y.o. Sex: male Date of Birth: 12-Sep-1928 MRN: [id number]. Admitting Physician: Norval Morton, MD NOB:SJGGE Koren Shiver, DO  Admit Date: 06/28/2021 Discharge date: 07/07/2021  Recommendations for Outpatient Follow-up:  Follow up with PCP in 1-2 weeks Please obtain CMP/CBC in one week Please ensure follow up at the stroke clinic. Needs 30 day cardiac event monitor to rule out PAF. ASA/Plavix x3 months followed by Plavix alone  Admitted From:  Home   Disposition: CIR   Discharge Condition: good  CODE STATUS:   Code Status: Full Code   Diet recommendation:  Diet Order             Diet - low sodium heart healthy           Diet Heart Room service appropriate? Yes; Fluid consistency: Thin  Diet effective now                    Brief Summary: Patient is a 88 y.o.  male with history of prior CVA, CKD stage IIIa, HLD, HTN who presented with a fall that occurred approximately 5 days prior to this hospitalization-he was noted to have slight left-sided weakness-upon further evaluation he was found to have acute CVA.  See below for further details.     Significant Hospital events: 2/7>> admit for acute CVA.    Stroke work-up: 2/8>> MRI brain: Acute infarct in the right corona radiata 2/8>> MRA brain: Right M1 segment stenosis, chronic occlusion of the V4 segment, chronic high-grade narrowing of the left V4 segment.  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 2/8>> Echo: EF 60-65% 2/8>> LDL: 102 2/8>> A1c: 5.8   Significant microbiology data: 2/3>> COVID/influenza PCR: Negative   Procedures: None   Consults:  Neurology  Brief Hospital Course: Acute right coronary radiata infarct: Likely due to small vessel disease-work-up as above.  Mild left-sided weakness persists.  Recommendations from neurology  are for aspirin 325 mg and Plavix 75 mg for 3 months, then Plavix alone.  Continue Lipitor 40 mg on discharge. Recommendations are to pursue 30-day cardiac event monitor in the outpatient setting to rule out A-fib.   HLD: On statin   HTN: BP stable-currently not on any antihypertensives   CKD stage IIIa: Close to baseline-follow periodically.   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   Memory loss: Probably has undiagnosed dementia-TSH stable-B12 borderline low.  Will need further work-up/neurocognitive testing to be done in the outpatient setting.   Hypothyroidism: Continue Synthroid   BMI: Estimated body mass index is 23.04 kg/m as calculated from the following:   Height as of 06/24/21: '5\' 3"'  (1.6 m).   Weight as of 06/24/21: 59 kg.   Discharge Diagnoses:  Principal Problem:   CVA (cerebral vascular accident) [facility]) with intracranial vascular stenosis Active Problems:   Hypothyroidism   Essential hypertension   Rheumatoid arthritis ([place])   Memory loss   Frequent falls   Dyslipidemia   Kidney disease, chronic, stage III (moderate, EGFR 30-59 ml/min) (HCC)   Intracranial vascular stenosis   Prediabetes   Normocytic anemia   History of stroke   Discharge Instructions:  Activity:  As tolerated with Full fall precautions use walker/cane & assistance as needed  Discharge Instructions     Ambulatory referral to Neurology   Complete by: As directed  Follow up with Dr. Leonie Man at [facility] in 4-6 weeks.  Patient is Dr. [name] patient. Thanks.   Ambulatory referral to Neurology   Complete by: As directed    An appointment is requested in approximately: 8 weeks   Diet - low sodium heart healthy   Complete by: As directed    Discharge instructions   Complete by: As directed    Follow with Primary MD  Carollee Herter, Alferd Apa, DO in 1-2 weeks  Please get a complete blood count and chemistry panel checked by  your Primary MD at your next visit, and again as instructed by your Primary MD.  Get Medicines reviewed and adjusted: Please take all your medications with you for your next visit with your Primary MD  Laboratory/radiological data: Please request your Primary MD to go over all hospital tests and procedure/radiological results at the follow up, please ask your Primary MD to get all Hospital records sent to his/her office.  In some cases, they will be blood work, cultures and biopsy results pending at the time of your discharge. Please request that your primary care M.D. follows up on these results.  Also Note the following: If you experience worsening of your admission symptoms, develop shortness of breath, life threatening emergency, suicidal or homicidal thoughts you must seek medical attention immediately by calling 911 or calling your MD immediately  if symptoms less severe.  You must read complete instructions/literature along with all the possible adverse reactions/side effects for all the Medicines you take and that have been prescribed to you. Take any new Medicines after you have completely understood and accpet all the possible adverse reactions/side effects.   Do not drive when taking Pain medications or sleeping medications (Benzodaizepines)  Do not take more than prescribed Pain, Sleep and Anxiety Medications. It is not advisable to combine anxiety,sleep and pain medications without talking with your primary care practitioner  Special Instructions: If you have smoked or chewed Tobacco  in the last 2 yrs please stop smoking, stop any regular Alcohol  and or any Recreational drug use.  Wear Seat belts while driving.  Please note: You were cared for by a hospitalist during your hospital stay. Once you are discharged, your primary care physician will handle any further medical issues. Please note that NO REFILLS for any discharge medications will be authorized once you are discharged,  as it is imperative that you return to your primary care physician (or establish a relationship with a primary care physician if you do not have one) for your post hospital discharge needs so that they can reassess your need for medications and monitor your lab values.   Increase activity slowly   Complete by: As directed       Allergies as of 07/07/2021       Reactions   Methotrexate Derivatives Other (See Comments)   Mouth sores / pancytopenia   Asa [aspirin] Other (See Comments)   Gastric symptoms - can tolerate 81 mg aspirin   Penicillin G Rash   Did it involve swelling of the face/tongue/throat, SOB, or low BP? Unknown Did it involve sudden or severe rash/hives, skin peeling, or any reaction on the inside of your mouth or nose? Unknown Did you need to seek medical attention at a hospital or doctor's office? Unknown When did it last happen?   unknown    If all above answers are NO, may proceed with cephalosporin use.        Medication List  STOP taking these medications    docusate sodium 100 MG capsule Commonly known as: COLACE   furosemide 20 MG tablet Commonly known as: LASIX   gemfibrozil 600 MG tablet Commonly known as: LOPID       TAKE these medications    aspirin 325 MG EC tablet Take 1 tablet (325 mg total) by mouth daily. Start taking on: July 08, 2021 What changed:  medication strength how much to take additional instructions   atorvastatin 40 MG tablet Commonly known as: LIPITOR Take 1 tablet (40 mg total) by mouth daily. Start taking on: July 08, 2021   clopidogrel 75 MG tablet Commonly known as: PLAVIX Take 1 tablet (75 mg total) by mouth daily. Start taking on: July 08, 2021   cyanocobalamin 1000 MCG/ML injection Commonly known as: (VITAMIN B-12) Inject 1 mL (1,000 mcg total) into the muscle once a week. Start taking on: July 08, 2021   guaiFENesin 600 MG 12 hr tablet Commonly known as: MUCINEX Take 600 mg by  mouth 2 (two) times daily as needed for cough or to loosen phlegm.   levothyroxine 100 MCG tablet Commonly known as: SYNTHROID Take 1 tablet (100 mcg total) by mouth daily before breakfast.   loratadine 10 MG tablet Commonly known as: CLARITIN Take 10 mg by mouth daily.   mirabegron ER 25 MG Tb24 tablet Commonly known as: MYRBETRIQ Take 25 mg by mouth at bedtime.   Multivitamin Adult Chew Chew 2 each by mouth daily.   NONFORMULARY OR COMPOUNDED ITEM 4 wheeled walker   #1  dx frequent falls, weakness   pantoprazole 40 MG tablet Commonly known as: PROTONIX Take 1 tablet (40 mg total) by mouth daily. Start taking on: July 08, 2021   polyvinyl alcohol 1.4 % ophthalmic solution Commonly known as: LIQUIFILM TEARS Place 1 drop into both eyes daily as needed for dry eyes.   traMADol 50 MG tablet Commonly known as: ULTRAM Take 0.5 tablets (25 mg total) by mouth every 6 (six) hours as needed for severe pain.        Follow-up Information     Garvin Fila, MD. Schedule an appointment as soon as possible for a visit in 1 month(s).   Specialties: Neurology, Radiology Why: stroke clinic Contact information: [address] [place] [address] [place], Alferd Apa, DO. Schedule an appointment as soon as possible for a visit in 1 week(s).   Specialty: Family Medicine Contact information: [address] [place] [address] [phone]                Allergies  Allergen Reactions   Methotrexate Derivatives Other (See Comments)    Mouth sores / pancytopenia   Asa [Aspirin] Other (See Comments)    Gastric symptoms - can tolerate 81 mg aspirin   Penicillin G Rash    Did it involve swelling of the face/tongue/throat, SOB, or low BP? Unknown Did it involve sudden or severe rash/hives, skin peeling, or any reaction on the inside of your mouth or nose? Unknown Did you need to seek medical attention at a  hospital or doctor's office? Unknown When did it last happen?   unknown    If all above answers are NO, may proceed with cephalosporin use.     Other Procedures/Studies: DG Chest 1 View  Result Date: 06/29/2021 CLINICAL DATA:  Fall EXAM: CHEST  1 VIEW COMPARISON:  06/09/2021 FINDINGS: Heart and  mediastinal contours are within normal limits. No focal opacities or effusions. No acute bony abnormality. Aortic atherosclerosis. IMPRESSION: No active cardiopulmonary disease. Electronically Signed   By: Rolm Baptise M.D.   On: 06/29/2021 01:06   DG Chest 2 View  Result Date: 06/09/2021 CLINICAL DATA:  Cough. EXAM: CHEST - 2 VIEW COMPARISON:  08/07/2020 FINDINGS: The lungs are clear without focal pneumonia, edema, pneumothorax or pleural effusion. Interstitial markings are diffusely coarsened with chronic features. The cardiopericardial silhouette is within normal limits for size. The visualized bony structures of the thorax show no acute abnormality. IMPRESSION: Chronic interstitial coarsening.  No active cardiopulmonary disease. Electronically Signed   By: Misty Stanley M.D.   On: 06/09/2021 16:51   CT Head Wo Contrast  Result Date: 06/29/2021 CLINICAL DATA:  Head trauma, minor (Age >= 65y).  Fall. EXAM: CT HEAD WITHOUT CONTRAST TECHNIQUE: Contiguous axial images were obtained from the base of the skull through the vertex without intravenous contrast. RADIATION DOSE REDUCTION: This exam was performed according to the departmental dose-optimization program which includes automated exposure control, adjustment of the mA and/or kV according to patient size and/or use of iterative reconstruction technique. COMPARISON:  06/24/2021 FINDINGS: Brain: There is atrophy and chronic small vessel disease changes. No acute intracranial abnormality. Specifically, no hemorrhage, hydrocephalus, mass lesion, acute infarction, or significant intracranial injury. Multiple bilateral basal ganglia and corona radiata lacunar  infarcts. Old bilateral cerebellar infarcts. Vascular: No hyperdense vessel or unexpected calcification. Skull: No acute calvarial abnormality. Sinuses/Orbits: No acute findings Other: None IMPRESSION: No acute intracranial abnormality. Electronically Signed   By: Rolm Baptise M.D.   On: 06/29/2021 02:11   CT Head Wo Contrast  Result Date: 06/24/2021 CLINICAL DATA:  Head trauma EXAM: CT HEAD WITHOUT CONTRAST TECHNIQUE: Contiguous axial images were obtained from the base of the skull through the vertex without intravenous contrast. RADIATION DOSE REDUCTION: This exam was performed according to the departmental dose-optimization program which includes automated exposure control, adjustment of the mA and/or kV according to patient size and/or use of iterative reconstruction technique. COMPARISON:  Brain MRI 12/11/2017, CT head 04/11/2019 FINDINGS: Brain: There is no evidence of acute intracranial hemorrhage, extra-axial fluid collection, or acute infarct. There is moderate global parenchymal volume loss with prominence of the ventricular system and extra-axial CSF spaces. Multiple remote lacunar infarcts are seen in the left basal ganglia and corona radiata and bilateral cerebellar hemispheres, unchanged. Additional confluent hypodensity in the subcortical and periventricular white matter likely reflects sequela of moderate chronic white matter microangiopathy. There is no mass lesion.  There is no midline shift. Vascular: There is calcification of the bilateral cavernous ICAs and vertebral arteries. Skull: Normal. Negative for fracture or focal lesion. Sinuses/Orbits: There is mild mucosal thickening in the paranasal sinuses. There is layering fluid in the left maxillary sinus. Bilateral lens implants are in place. The globes and orbits are otherwise unremarkable. Other: None. IMPRESSION: 1. No acute intracranial pathology. 2. Layering fluid in the left maxillary sinus which can be seen with acute sinusitis in the  correct clinical setting. Electronically Signed   By: Valetta Mole M.D.   On: 06/24/2021 11:45   CT Cervical Spine Wo Contrast  Result Date: 06/29/2021 CLINICAL DATA:  Neck trauma (Age >= 65y).  Fall. EXAM: CT CERVICAL SPINE WITHOUT CONTRAST TECHNIQUE: Multidetector CT imaging of the cervical spine was performed without intravenous contrast. Multiplanar CT image reconstructions were also generated. RADIATION DOSE REDUCTION: This exam was performed according to the departmental dose-optimization program which includes  automated exposure control, adjustment of the mA and/or kV according to patient size and/or use of iterative reconstruction technique. COMPARISON:  03/09/2019 FINDINGS: Alignment: No subluxation. Skull base and vertebrae: No acute fracture. No primary bone lesion or focal pathologic process. Soft tissues and spinal canal: No prevertebral fluid or swelling. No visible canal hematoma. Disc levels: Diffuse advanced degenerative disc disease and facet disease. Upper chest: No acute findings Other: None IMPRESSION: Advanced diffuse degenerative disc and facet disease. No acute bony abnormality. Electronically Signed   By: Rolm Baptise M.D.   On: 06/29/2021 02:13   CT Lumbar Spine Wo Contrast  Result Date: 06/24/2021 CLINICAL DATA:  Low back pain, trauma EXAM: CT LUMBAR SPINE WITHOUT CONTRAST TECHNIQUE: Multidetector CT imaging of the lumbar spine was performed without intravenous contrast administration. Multiplanar CT image reconstructions were also generated. RADIATION DOSE REDUCTION: This exam was performed according to the departmental dose-optimization program which includes automated exposure control, adjustment of the mA and/or kV according to patient size and/or use of iterative reconstruction technique. COMPARISON:  Correlation made with lumbar spine MRI November 2020 FINDINGS: Segmentation: 5 lumbar type vertebrae. Alignment: Dextrocurvature. Similar anterolisthesis at L3-L4 and L4-L5.  Vertebrae: Lumbar vertebral body heights are unchanged. Chronic anterior wedging of T12 greater than T11. No acute fracture identified. Paraspinal and other soft tissues: No paraspinal hematoma. Atherosclerosis. Disc levels: Degenerative changes are present, primarily at L4-L5 greater than L3-L4 with canal and foraminal stenosis. IMPRESSION: No acute fracture. Electronically Signed   By: Macy Mis M.D.   On: 06/24/2021 11:52   CT Knee Left Wo Contrast  Result Date: 06/24/2021 CLINICAL DATA:  Anterior knee pain since falling yesterday. Occult fracture suspected. EXAM: CT OF THE LEFT KNEE WITHOUT CONTRAST TECHNIQUE: Multidetector CT imaging of the left knee was performed according to the standard protocol. Multiplanar CT image reconstructions were also generated. RADIATION DOSE REDUCTION: This exam was performed according to the departmental dose-optimization program which includes automated exposure control, adjustment of the mA and/or kV according to patient size and/or use of iterative reconstruction technique. COMPARISON:  Radiographs same date. FINDINGS: Bones/Joint/Cartilage No evidence of acute fracture or dislocation. Mild tricompartmental degenerative changes for age. Small nonspecific knee joint effusion. Ligaments Suboptimally assessed by CT. Muscles and Tendons Patellar spurring at the quadriceps and patellar tendon attachments. No focal muscular abnormalities. Soft tissues No evidence of periarticular hematoma, foreign body or soft tissue emphysema. Mild popliteal atherosclerosis. IMPRESSION: 1. No acute osseous findings at the left knee. 2. Small knee joint effusion. Mild tricompartmental degenerative changes. 3. No acute or focal soft tissue abnormalities. Electronically Signed   By: Richardean Sale M.D.   On: 06/24/2021 13:12   MR ANGIO HEAD WO CONTRAST  Result Date: 06/29/2021 CLINICAL DATA:  Neuro deficit with acute stroke suspected EXAM: MRI HEAD WITHOUT CONTRAST MRA HEAD WITHOUT CONTRAST  MRA OF THE NECK WITHOUT AND WITH CONTRAST TECHNIQUE: Multiplanar, multi-echo pulse sequences of the brain and surrounding structures were acquired without intravenous contrast. Angiographic images of the Circle of Willis were acquired using MRA technique without intravenous contrast. Angiographic images of the neck were acquired using MRA technique without and with intravenous contrast. Carotid stenosis measurements (when applicable) are obtained utilizing NASCET criteria, using the distal internal carotid diameter as the denominator. CONTRAST:  Reference EMR. COMPARISON:  Head CT from earlier today. CTA of the head neck 07/09/2018 FINDINGS: MR HEAD FINDINGS Brain: Band of restricted diffusion at the right corona radiata. Background of chronic small vessel ischemia with confluent gliosis in the  periventricular white matter. Scattered chronic lacunes especially in the left frontal region. Small remote bilateral cerebellar infarcts which are numerous. Generalized brain atrophy. No acute hemorrhage, hydrocephalus, or masslike finding. Chronic right thalamic lacunar infarct. Vascular: Normal flow voids. Skull and upper cervical spine: Normal marrow signal Sinuses/Orbits: Bilateral cataract resection. MRA HEAD FINDINGS Anterior circulation: Atheromatous irregularity of intracranial vessels. High-grade right M1 segment stenosis just before the bifurcation. Moderate narrowing at the right A1 origin. The right A1 segment is non dominant. Posterior circulation: No flow seen in the covered right V4 segment. Extensive atheromatous irregularity of the left vertebral artery, so diffusely diseased that accurate measurement of stenosis is complicated. There is an at least 60% narrowing at the distal V4 segment when compared with the more normal diameter proximal V4 segment. Extensive atheromatous irregularity of the basilar. The degree of vertebral and basilar stenosis actually appears improved from prior CTA, especially at the  distal V4 segment. Rightward outpouching of the mid basilar is likely atheromatous. No suspected aneurysm. Fetal type left PCA. Symmetric flow in the atheromatous posterior cerebral arteries. Anatomic variants:As above MRA NECK FINDINGS Aortic arch: Normal diameter with 3 vessel branching. Right carotid system: Vessels are smooth and diffusely patent. Mild plaque like changes at the bifurcation. Left carotid system: Vessels are smooth and diffusely patent. Vertebral arteries: No proximal subclavian stenosis. Non dominant right vertebral artery with flow seen until the V3 segment. Chronic distal right vertebral occlusion by prior CTA. Left vertebral origin stenosis of approximately 50%, non progressed from 2020. Other: None. IMPRESSION: Brain MRI: 1. Small acute perforator infarct at the right corona radiata. 2. Extensive chronic small vessel ischemia. Chronic small vessel infarcts in the cerebellum and subcortical brain. 3. Brain atrophy. Intracranial MRA: 1. Advanced and diffuse atherosclerosis. 2. Advanced right M1 segment stenosis that is new from a 2020 CTA. 3. 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 Chronic high-grade narrowing in the left V4 segment and basilar, non progressed from 2020. Neck MRA: 1. 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 2. 50% narrowing at the left vertebral origin, chronic when compared to 2020 CTA. 3. No stenosis of the cervical common and internal carotids. Electronically Signed   By: Jorje Guild M.D.   On: 06/29/2021 07:16   MR Angiogram Neck W or Wo Contrast  Result Date: 06/29/2021 CLINICAL DATA:  Neuro deficit with acute stroke suspected EXAM: MRI HEAD WITHOUT CONTRAST MRA HEAD WITHOUT CONTRAST MRA OF THE NECK WITHOUT AND WITH CONTRAST TECHNIQUE: Multiplanar, multi-echo pulse sequences of the brain and surrounding structures were acquired without intravenous contrast. Angiographic images of the Circle of Willis were acquired using MRA technique without  intravenous contrast. Angiographic images of the neck were acquired using MRA technique without and with intravenous contrast. Carotid stenosis measurements (when applicable) are obtained utilizing NASCET criteria, using the distal internal carotid diameter as the denominator. CONTRAST:  Reference EMR. COMPARISON:  Head CT from earlier today. CTA of the head neck 07/09/2018 FINDINGS: MR HEAD FINDINGS Brain: Band of restricted diffusion at the right corona radiata. Background of chronic small vessel ischemia with confluent gliosis in the periventricular white matter. Scattered chronic lacunes especially in the left frontal region. Small remote bilateral cerebellar infarcts which are numerous. Generalized brain atrophy. No acute hemorrhage, hydrocephalus, or masslike finding. Chronic right thalamic lacunar infarct. Vascular: Normal flow voids. Skull and upper cervical spine: Normal marrow signal Sinuses/Orbits: Bilateral cataract resection. MRA HEAD FINDINGS Anterior circulation: Atheromatous irregularity of intracranial vessels. High-grade right M1 segment stenosis just  before the bifurcation. Moderate narrowing at the right A1 origin. The right A1 segment is non dominant. Posterior circulation: No flow seen in the covered right V4 segment. Extensive atheromatous irregularity of the left vertebral artery, so diffusely diseased that accurate measurement of stenosis is complicated. There is an at least 60% narrowing at the distal V4 segment when compared with the more normal diameter proximal V4 segment. Extensive atheromatous irregularity of the basilar. The degree of vertebral and basilar stenosis actually appears improved from prior CTA, especially at the distal V4 segment. Rightward outpouching of the mid basilar is likely atheromatous. No suspected aneurysm. Fetal type left PCA. Symmetric flow in the atheromatous posterior cerebral arteries. Anatomic variants:As above MRA NECK FINDINGS Aortic arch: Normal diameter  with 3 vessel branching. Right carotid system: Vessels are smooth and diffusely patent. Mild plaque like changes at the bifurcation. Left carotid system: Vessels are smooth and diffusely patent. Vertebral arteries: No proximal subclavian stenosis. Non dominant right vertebral artery with flow seen until the V3 segment. Chronic distal right vertebral occlusion by prior CTA. Left vertebral origin stenosis of approximately 50%, non progressed from 2020. Other: None. IMPRESSION: Brain MRI: 1. Small acute perforator infarct at the right corona radiata. 2. Extensive chronic small vessel ischemia. Chronic small vessel infarcts in the cerebellum and subcortical brain. 3. Brain atrophy. Intracranial MRA: 1. Advanced and diffuse atherosclerosis. 2. Advanced right M1 segment stenosis that is new from a 2020 CTA. 3. 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 Chronic high-grade narrowing in the left V4 segment and basilar, non progressed from 2020. Neck MRA: 1. 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 2. 50% narrowing at the left vertebral origin, chronic when compared to 2020 CTA. 3. No stenosis of the cervical common and internal carotids. Electronically Signed   By: Jorje Guild M.D.   On: 06/29/2021 07:16   MR BRAIN WO CONTRAST  Result Date: 06/29/2021 CLINICAL DATA:  Neuro deficit with acute stroke suspected EXAM: MRI HEAD WITHOUT CONTRAST MRA HEAD WITHOUT CONTRAST MRA OF THE NECK WITHOUT AND WITH CONTRAST TECHNIQUE: Multiplanar, multi-echo pulse sequences of the brain and surrounding structures were acquired without intravenous contrast. Angiographic images of the Circle of Willis were acquired using MRA technique without intravenous contrast. Angiographic images of the neck were acquired using MRA technique without and with intravenous contrast. Carotid stenosis measurements (when applicable) are obtained utilizing NASCET criteria, using the distal internal carotid diameter as the denominator.  CONTRAST:  Reference EMR. COMPARISON:  Head CT from earlier today. CTA of the head neck 07/09/2018 FINDINGS: MR HEAD FINDINGS Brain: Band of restricted diffusion at the right corona radiata. Background of chronic small vessel ischemia with confluent gliosis in the periventricular white matter. Scattered chronic lacunes especially in the left frontal region. Small remote bilateral cerebellar infarcts which are numerous. Generalized brain atrophy. No acute hemorrhage, hydrocephalus, or masslike finding. Chronic right thalamic lacunar infarct. Vascular: Normal flow voids. Skull and upper cervical spine: Normal marrow signal Sinuses/Orbits: Bilateral cataract resection. MRA HEAD FINDINGS Anterior circulation: Atheromatous irregularity of intracranial vessels. High-grade right M1 segment stenosis just before the bifurcation. Moderate narrowing at the right A1 origin. The right A1 segment is non dominant. Posterior circulation: No flow seen in the covered right V4 segment. Extensive atheromatous irregularity of the left vertebral artery, so diffusely diseased that accurate measurement of stenosis is complicated. There is an at least 60% narrowing at the distal V4 segment when compared with the more normal diameter proximal V4 segment. Extensive atheromatous  irregularity of the basilar. The degree of vertebral and basilar stenosis actually appears improved from prior CTA, especially at the distal V4 segment. Rightward outpouching of the mid basilar is likely atheromatous. No suspected aneurysm. Fetal type left PCA. Symmetric flow in the atheromatous posterior cerebral arteries. Anatomic variants:As above MRA NECK FINDINGS Aortic arch: Normal diameter with 3 vessel branching. Right carotid system: Vessels are smooth and diffusely patent. Mild plaque like changes at the bifurcation. Left carotid system: Vessels are smooth and diffusely patent. Vertebral arteries: No proximal subclavian stenosis. Non dominant right vertebral  artery with flow seen until the V3 segment. Chronic distal right vertebral occlusion by prior CTA. Left vertebral origin stenosis of approximately 50%, non progressed from 2020. Other: None. IMPRESSION: Brain MRI: 1. Small acute perforator infarct at the right corona radiata. 2. Extensive chronic small vessel ischemia. Chronic small vessel infarcts in the cerebellum and subcortical brain. 3. Brain atrophy. Intracranial MRA: 1. Advanced and diffuse atherosclerosis. 2. Advanced right M1 segment stenosis that is new from a 2020 CTA. 3. 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 Chronic high-grade narrowing in the left V4 segment and basilar, non progressed from 2020. Neck MRA: 1. 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 2. 50% narrowing at the left vertebral origin, chronic when compared to 2020 CTA. 3. No stenosis of the cervical common and internal carotids. Electronically Signed   By: Jorje Guild M.D.   On: 06/29/2021 07:16   DG Shoulder Left  Result Date: 06/29/2021 CLINICAL DATA:  Fall EXAM: LEFT SHOULDER - 2+ VIEW COMPARISON:  None. FINDINGS: Degenerative changes in the [facility] joint with joint space narrowing and spurring. Glenohumeral joint is maintained. No acute bony abnormality. Specifically, no fracture, subluxation, or dislocation. Soft tissues are intact. IMPRESSION: Degenerative changes in the left AC joint. No acute bony abnormality. Electronically Signed   By: Rolm Baptise M.D.   On: 06/29/2021 00:57   DG Knee Complete 4 Views Left  Result Date: 06/24/2021 CLINICAL DATA:  Trauma, fall, pain EXAM: LEFT KNEE - COMPLETE 4+ VIEW COMPARISON:  None. FINDINGS: No recent displaced fracture or dislocation is seen. In the lateral view, there is minimal cortical irregularity in the posterior margin of head of the fibula which could not be seen in the rest of the images. There is soft tissue fullness in the suprapatellar bursa, possibly suggesting small effusion. Degenerative changes are noted  with small bony spurs, more so in the patella. IMPRESSION: No displaced fracture or dislocation is seen. There is minimal cortical irregularity in the posterior margin of head of the fibula seen in single view. This may be an artifact or suggest undisplaced fracture. Please correlate with clinical physical examination findings. There is soft tissue fullness in the suprapatellar bursa suggesting possible small effusion. Electronically Signed   By: Elmer Picker M.D.   On: 06/24/2021 11:30   DG Humerus Left  Result Date: 06/29/2021 CLINICAL DATA:  Fall EXAM: LEFT HUMERUS - 2+ VIEW COMPARISON:  None. FINDINGS: There is no evidence of fracture or other focal bone lesions. Soft tissues are unremarkable. Degenerative changes in the left AC joint. IMPRESSION: No acute bony abnormality. Electronically Signed   By: Rolm Baptise M.D.   On: 06/29/2021 00:58   ECHOCARDIOGRAM COMPLETE  Result Date: 06/29/2021    ECHOCARDIOGRAM REPORT   Patient Name:   [name] Date of Exam: 06/29/2021 Medical Rec #:  [id number]  Height:       63.0 in Accession #:    [id number]  Weight:       130.1 lb Date of Birth:  12-05-1928  BSA:          1.611 m Patient Age:    83 years   BP:           129/84 mmHg Patient Gender: M          HR:           57 bpm. Exam Location:  Inpatient Procedure: 2D Echo, Cardiac Doppler and Color Doppler Indications:    Stroke  History:        Patient has no prior history of Echocardiogram examinations.                 Risk Factors:Hypertension.  Sonographer:    Jyl Heinz Referring Phys: [id number] RONDELL A SMITH IMPRESSIONS  1. Left ventricular ejection fraction, by estimation, is 60 to 65%. The left ventricle has normal function. The left ventricle has no regional wall motion abnormalities. Left ventricular diastolic parameters are consistent with Grade I diastolic dysfunction (impaired relaxation).  2. Right ventricular systolic function is normal. The right ventricular size is normal. Tricuspid  regurgitation signal is inadequate for assessing PA pressure.  3. The mitral valve is grossly normal. Trivial mitral valve regurgitation.  4. The aortic valve is grossly normal. Aortic valve regurgitation is mild.  5. The inferior vena cava is normal in size with greater than 50% respiratory variability, suggesting right atrial pressure of 3 mmHg.  6. Cannot exclude a small PFO. Comparison(s): No prior Echocardiogram. FINDINGS  Left Ventricle: Left ventricular ejection fraction, by estimation, is 60 to 65%. The left ventricle has normal function. The left ventricle has no regional wall motion abnormalities. The left ventricular internal cavity size was normal in size. There is  no left ventricular hypertrophy. Left ventricular diastolic parameters are consistent with Grade I diastolic dysfunction (impaired relaxation). Right Ventricle: The right ventricular size is normal. No increase in right ventricular wall thickness. Right ventricular systolic function is normal. Tricuspid regurgitation signal is inadequate for assessing PA pressure. Left Atrium: Left atrial size was normal in size. Right Atrium: Right atrial size was normal in size. Pericardium: Trivial pericardial effusion is present. Mitral Valve: The mitral valve is grossly normal. Mild mitral annular calcification. Trivial mitral valve regurgitation. Tricuspid Valve: The tricuspid valve is grossly normal. Tricuspid valve regurgitation is not demonstrated. Aortic Valve: The aortic valve is grossly normal. Aortic valve regurgitation is mild. Aortic regurgitation PHT measures 777 msec. Aortic valve peak gradient measures 10.6 mmHg. Pulmonic Valve: The pulmonic valve was not well visualized. Pulmonic valve regurgitation is not visualized. Aorta: The aortic root and ascending aorta are structurally normal, with no evidence of dilitation. Venous: The inferior vena cava is normal in size with greater than 50% respiratory variability, suggesting right atrial  pressure of 3 mmHg. IAS/Shunts: Cannot exclude a small PFO.  LEFT VENTRICLE PLAX 2D LVIDd:         3.80 cm     Diastology LVIDs:         2.70 cm     LV e' medial:    5.11 cm/s LV PW:         1.10 cm     LV E/e' medial:  15.9 LV IVS:        1.00 cm     LV e' lateral:   7.18 cm/s LVOT diam:     1.80 cm     LV E/e' lateral: 11.3 LV SV:  58 LV SV Index:   36 LVOT Area:     2.54 cm  LV Volumes (MOD) LV vol d, MOD A2C: 72.6 ml LV vol d, MOD A4C: 74.1 ml LV vol s, MOD A2C: 25.4 ml LV vol s, MOD A4C: 27.4 ml LV SV MOD A2C:     47.2 ml LV SV MOD A4C:     74.1 ml LV SV MOD BP:      45.8 ml RIGHT VENTRICLE             IVC RV Basal diam:  3.50 cm     IVC diam: 1.00 cm RV Mid diam:    2.30 cm RV S prime:     11.90 cm/s TAPSE (M-mode): 2.4 cm LEFT ATRIUM             Index        RIGHT ATRIUM           Index LA diam:        2.90 cm 1.80 cm/m   RA Area:     10.90 cm LA Vol (A2C):   32.7 ml 20.30 ml/m  RA Volume:   21.00 ml  13.04 ml/m LA Vol (A4C):   36.2 ml 22.48 ml/m LA Biplane Vol: 35.4 ml 21.98 ml/m  AORTIC VALVE AV Area (Vmax): 1.41 cm AV Vmax:        163.00 cm/s AV Peak Grad:   10.6 mmHg LVOT Vmax:      90.10 cm/s LVOT Vmean:     70.500 cm/s LVOT VTI:       0.227 m AI PHT:         777 msec  AORTA Ao Root diam: 2.90 cm Ao Asc diam:  2.80 cm MITRAL VALVE MV Area (PHT): 3.12 cm     SHUNTS MV Decel Time: 243 msec     Systemic VTI:  0.23 m MV E velocity: 81.00 cm/s   Systemic Diam: 1.80 cm MV A velocity: 100.00 cm/s MV E/A ratio:  0.81 Mary Scientist, physiological signed by Phineas Inches Signature Date/Time: 06/29/2021/1:24:13 PM    Final    DG Hip Unilat With Pelvis 2-3 Views Left  Result Date: 06/23/2021 CLINICAL DATA:  Fall EXAM: DG HIP (WITH OR WITHOUT PELVIS) 3V LEFT COMPARISON:  None. FINDINGS: There is no evidence of hip fracture or dislocation. Prior abdominal wall hernia repair. Degenerative changes of the partially visualized lower lumbar spine. Mild degenerative changes of the bilateral hips. IMPRESSION: No  acute osseous abnormality. Electronically Signed   By: Yetta Glassman M.D.   On: 06/23/2021 10:34   DG Hip Unilat W or Wo Pelvis 2-3 Views Right  Result Date: 06/29/2021 CLINICAL DATA:  Fall EXAM: DG HIP (WITH OR WITHOUT PELVIS) 2-3V RIGHT COMPARISON:  None. FINDINGS: Hip joints and SI joints are symmetric. No acute bony abnormality. Specifically, no fracture, subluxation, or dislocation. IMPRESSION: No acute bony abnormality. Electronically Signed   By: Rolm Baptise M.D.   On: 06/29/2021 00:58     TODAY-DAY OF DISCHARGE:  Subjective:   [name] today has no headache,no chest abdominal pain,no new weakness tingling or numbness, feels much better wants to go home today.   Objective:   Blood pressure 133/69, pulse 64, temperature (!) 97.4 F (36.3 C), temperature source Oral, resp. rate 18, SpO2 97 %.  Intake/Output Summary (Last 24 hours) at 07/07/2021 0930 Last data filed at 07/07/2021 0700 Gross per 24 hour  Intake 120 ml  Output 1350 ml  Net -1230  ml   There were no vitals filed for this visit.  Exam: Awake Alert, Oriented *3, No new F.N deficits, Normal affect [place].AT,PERRAL Supple Neck,No JVD, No cervical lymphadenopathy appriciated.  Symmetrical Chest wall movement, Good air movement bilaterally, CTAB RRR,No Gallops,Rubs or new Murmurs, No Parasternal Heave +ve B.Sounds, Abd Soft, Non tender, No organomegaly appriciated, No rebound -guarding or rigidity. No Cyanosis, Clubbing or edema, No new Rash or bruise   PERTINENT RADIOLOGIC STUDIES: No results found.   PERTINENT LAB RESULTS: CBC: No results for input(s): WBC, HGB, HCT, PLT in the last 72 hours. CMET CMP     Component Value Date/Time   NA 137 07/01/2021 0250   NA 139 08/06/2020 1552   K 4.0 07/01/2021 0250   CL 104 07/01/2021 0250   CO2 24 07/01/2021 0250   GLUCOSE 111 (H) 07/01/2021 0250   BUN 29 (H) 07/01/2021 0250   BUN 28 08/06/2020 1552   CREATININE 1.54 (H) 07/01/2021 0250   CALCIUM 9.6  07/01/2021 0250   PROT 7.0 06/29/2021 0051   PROT 7.6 08/06/2020 1552   ALBUMIN 3.2 (L) 06/29/2021 0051   ALBUMIN 4.5 08/06/2020 1552   AST 20 06/29/2021 0051   ALT 11 06/29/2021 0051   ALKPHOS 64 06/29/2021 0051   BILITOT 0.2 (L) 06/29/2021 0051   BILITOT 0.5 08/06/2020 1552   GFRNONAA 42 (L) 07/01/2021 0250   GFRAA 60 (L) 03/10/2019 0330    GFR Estimated Creatinine Clearance: 25.1 mL/min (A) (by C-G formula based on SCr of 1.54 mg/dL (H)). No results for input(s): LIPASE, AMYLASE in the last 72 hours. No results for input(s): CKTOTAL, CKMB, CKMBINDEX, TROPONINI in the last 72 hours. Invalid input(s): POCBNP No results for input(s): DDIMER in the last 72 hours. No results for input(s): HGBA1C in the last 72 hours. No results for input(s): CHOL, HDL, LDLCALC, TRIG, CHOLHDL, LDLDIRECT in the last 72 hours. No results for input(s): TSH, T4TOTAL, T3FREE, THYROIDAB in the last 72 hours.  Invalid input(s): FREET3 No results for input(s): VITAMINB12, FOLATE, FERRITIN, TIBC, IRON, RETICCTPCT in the last 72 hours. Coags: No results for input(s): INR in the last 72 hours.  Invalid input(s): PT Microbiology: No results found for this or any previous visit (from the past 240 hour(s)).  FURTHER DISCHARGE INSTRUCTIONS:  Get Medicines reviewed and adjusted: Please take all your medications with you for your next visit with your Primary MD  Laboratory/radiological data: Please request your Primary MD to go over all hospital tests and procedure/radiological results at the follow up, please ask your Primary MD to get all Hospital records sent to his/her office.  In some cases, they will be blood work, cultures and biopsy results pending at the time of your discharge. Please request that your primary care M.D. goes through all the records of your hospital data and follows up on these results.  Also Note the following: If you experience worsening of your admission symptoms, develop shortness  of breath, life threatening emergency, suicidal or homicidal thoughts you must seek medical attention immediately by calling 911 or calling your MD immediately  if symptoms less severe.  You must read complete instructions/literature along with all the possible adverse reactions/side effects for all the Medicines you take and that have been prescribed to you. Take any new Medicines after you have completely understood and accpet all the possible adverse reactions/side effects.   Do not drive when taking Pain medications or sleeping medications (Benzodaizepines)  Do not take more than prescribed Pain, Sleep and  Anxiety Medications. It is not advisable to combine anxiety,sleep and pain medications without talking with your primary care practitioner  Special Instructions: If you have smoked or chewed Tobacco  in the last 2 yrs please stop smoking, stop any regular Alcohol  and or any Recreational drug use.  Wear Seat belts while driving.  Please note: You were cared for by a hospitalist during your hospital stay. Once you are discharged, your primary care physician will handle any further medical issues. Please note that NO REFILLS for any discharge medications will be authorized once you are discharged, as it is imperative that you return to your primary care physician (or establish a relationship with a primary care physician if you do not have one) for your post hospital discharge needs so that they can reassess your need for medications and monitor your lab values.  Total Time spent coordinating discharge including counseling, education and face to face time equals greater than 30 minutes.  Signed: Tangee Lindsey 07/07/2021 9:30 AM

## 2021-07-07 NOTE — Progress Notes (Signed)
Inpatient Rehabilitation Admissions Coordinator   I have insurance approval and [facility] bed to admit patient to today. I met at bedside with patient and spoke with his daughter, [name] by phone and they are in agreement. I have alerted acute team and TOC and will make the arrangements to admit today.  Danne Baxter, RN, MSN Rehab Admissions Coordinator [phone] 07/07/2021 10:04 AM

## 2021-07-07 NOTE — Progress Notes (Signed)
Christian Heys, MD  Physician Physical Medicine and Rehabilitation PMR Pre-admission    Signed Date of Service:  07/02/2021  7:42 PM  Related encounter: ED to Hosp-Admission (Discharged) from 06/28/2021 in [place] [facility]   Signed      Show:Clear all '[x]' Written'[x]' Templated'[]' Copied  Added by: '[x]' Cristina Gong, RN'[x]' Lind Covert, Lauren Mamie Nick, CCC-SLP'[x]' Christian Heys, MD  '[]' Hover for details                                                                                                                                                                                                                                                                                                                                                                                                                                                       PMR Admission Coordinator Pre-Admission Assessment   Patient: [name] is an 88 y.o., male MRN: [id number] DOB: 1928/06/05 Height:   Weight:     Insurance Information HMO: yes    PPO:      PCP:      IPA:      80/20:      OTHER:  PRIMARY: UHC Medicare      Policy#: [id number]      Subscriber: patient CM Name: Phalendria      Phone#: [phone] option #5     Fax#: [phone]  Pre-Cert#: [id number]  approved for 7 days    Employer:  Benefits:  Phone #: [email or web address]     Name:  Eff. Date: 05/22/21     Deduct: does not have deductible      Out of Pocket Max: $3,600 ($35 met)      Life Max: NA CIR: $295/day co-pay for days 1-5, 100% coverage days 6+      SNF: 100% coverage days 1-20, $196 co-pay/day days 21-39, 100% coverage days 40-100 Outpatient: $20 visit/co-pay     Co-Pay:  Home Health: 100% coverage      Co-Pay:  DME: 80% coverage     Co-Pay: 20% co-insurance Providers: in-network SECONDARY:        Policy#:      Phone#:    Development worker, community:       Phone#:    [employer] for patients in Inpatient Rehabilitation Facilities with attached Privacy Act [place] Records was provided and verbally reviewed with: Family   Emergency Contact Information Contact Information       Name Relation Home Work Mobile    [name] Daughter [phone]   [phone]    [name],[name] Spouse [phone]             Current Medical History  Patient Admitting Diagnosis: R CVA   History of Present Illness: Pt is a 88 year old male with medical hx significant for: HTN, CVA, CKD 3, hypothyroidism, colon CA s/p hemicolectomy with chemotherapy, RA, memory issues. Pt presented to the hospital on 06/28/21 after a fall. Pt was c/o left head, shoulder, arm, and right hip pain. Pt had recently been to ER two times prior after falls. In ER, daughter reported she noticed left-sided deficits approximately a week prior to current hospital presentation. X-rays of chest, humerus, and shoulder did not reveal any acute abnormalities. CT head and cervical spine did not reveal any acute abnormality. Urinalysis did not show any significant signs of infection. MRI showed small acute perforator infarct of right corona radiata with advanced right M1 segment stenosis. Therapy evaluations completed and CIR recommended d/t pt's deficits in functional mobility, ability to complete ADLs independently and cognitive-linguistic deficits.   Complete NIHSS TOTAL: 1   Patient's medical record from [facility] has been reviewed by the rehabilitation admission coordinator and physician.   Past Medical History      Past Medical History:  Diagnosis Date   Adenocarcinoma ([place]) 1985    colon s/p right hemicolectomy and chemotherapy. F/U with cancer center and Dr. Lucia Gaskins rotinely   Depression     Eczema     Epistaxis      f/u per ENT   Erectile dysfunction     Gastric ulcer 12/11     H-Pylori Tx, EGD 3-12: gastritis   Hay fever     Head injury 11/2017    with fall   Hyperlipemia     Hypertension     Hypothyroidism      Hypothyroid   Insomnia     Osteopenia       per DEXA 07-2008 (Rx fosamax)   Rheumatoid arthritis ([place])     Stroke ([place])       " balance issue"    Has the patient had major surgery during 100 days prior to admission? No   Family History   family history is not on file.   Current Medications   Current Facility-Administered Medications:     stroke: mapping our early stages of recovery book, ,  Does not apply, Once, Norval Morton, MD   acetaminophen (TYLENOL) tablet 650 mg, 650 mg, Oral, Q4H PRN, 650 mg at 07/07/21 0828 **OR** acetaminophen (TYLENOL) 160 MG/5ML solution 650 mg, 650 mg, Per Tube, Q4H PRN **OR** acetaminophen (TYLENOL) suppository 650 mg, 650 mg, Rectal, Q4H PRN, Tamala Julian, Rondell A, MD   aspirin EC tablet 325 mg, 325 mg, Oral, Daily, Rosalin Hawking, MD, 325 mg at 07/07/21 1308   atorvastatin (LIPITOR) tablet 40 mg, 40 mg, Oral, Daily, Greta Doom, MD, 40 mg at 07/07/21 0810   clopidogrel (PLAVIX) tablet 75 mg, 75 mg, Oral, Daily, Greta Doom, MD, 75 mg at 07/07/21 0810   [COMPLETED] cyanocobalamin ((VITAMIN B-12)) injection 1,000 mcg, 1,000 mcg, Intramuscular, Daily, 1,000 mcg at 07/07/21 0809 **FOLLOWED BY** [START ON 07/08/2021] cyanocobalamin ((VITAMIN B-12)) injection 1,000 mcg, 1,000 mcg, Intramuscular, Weekly, Elgergawy, Silver Huguenin, MD   docusate sodium (COLACE) capsule 200 mg, 200 mg, Oral, Daily, Smith, Rondell A, MD, 200 mg at 07/07/21 0809   enoxaparin (LOVENOX) injection 30 mg, 30 mg, Subcutaneous, Q24H, Smith, Rondell A, MD, 30 mg at 07/07/21 6578   guaiFENesin (MUCINEX) 12 hr tablet 600 mg, 600 mg, Oral, BID PRN, Tamala Julian, Rondell A, MD   levothyroxine (SYNTHROID) tablet 100 mcg, 100 mcg, Oral, QAC breakfast, Tamala Julian, Rondell A, MD, 100 mcg at 07/07/21 0557   lip balm (BLISTEX) ointment 1 application, 1  application, Topical, PRN, Elgergawy, Silver Huguenin, MD, 1 application at 46/96/29 0636   loratadine (CLARITIN) tablet 10 mg, 10 mg, Oral, Daily PRN, Tamala Julian, Rondell A, MD   LORazepam (ATIVAN) injection 1 mg, 1 mg, Intravenous, Once PRN, Tamala Julian, Rondell A, MD   melatonin tablet 3 mg, 3 mg, Oral, QHS PRN, Opyd, Ilene Qua, MD, 3 mg at 07/06/21 2119   mirabegron ER (MYRBETRIQ) tablet 25 mg, 25 mg, Oral, QHS, Smith, Rondell A, MD, 25 mg at 07/06/21 2119   pantoprazole (PROTONIX) EC tablet 40 mg, 40 mg, Oral, Daily, Elgergawy, Silver Huguenin, MD, 40 mg at 07/07/21 0810   polyvinyl alcohol (LIQUIFILM TEARS) 1.4 % ophthalmic solution 1 drop, 1 drop, Both Eyes, Daily PRN, Tamala Julian, Rondell A, MD   senna-docusate (Senokot-S) tablet 1 tablet, 1 tablet, Oral, QHS PRN, Fuller Plan A, MD, 1 tablet at 07/02/21 2102   Patients Current Diet:  Diet Order                  Diet - low sodium heart healthy             Diet Heart Room service appropriate? Yes; Fluid consistency: Thin  Diet effective now                       Precautions / Restrictions Precautions Precautions: Fall Precaution Comments: has fallen >10x in past month (per daughter); sometimes 2x/day Restrictions Weight Bearing Restrictions: No    Has the patient had 2 or more falls or a fall with injury in the past year? Yes   Prior Activity Level   Prior Functional Level Self Care: Did the patient need help bathing, dressing, using the toilet or eating? Independent   Indoor Mobility: Did the patient need assistance with walking from room to room (with or without device)? Independent   Stairs: Did the patient need assistance with internal or external stairs (with or without device)? Needed some help   Functional Cognition: Did the patient need help planning regular tasks such as shopping or remembering to take medications? Needed some help  Patient Information Are you of Hispanic, Latino/a,or Spanish origin?: A. No, not of Hispanic, Latino/a,  or Spanish origin What is your race?: I. Vietnamese Do you need or want an interpreter to communicate with a doctor or health care staff?: 1. Yes   Patient's Response To:  Health Literacy and Transportation Is the patient able to respond to health literacy and transportation needs?: Yes Health Literacy - How often do you need to have someone help you when you read instructions, pamphlets, or other written material from your doctor or pharmacy?: Often In the past 12 months, has lack of transportation kept you from medical appointments or from getting medications?: No In the past 12 months, has lack of transportation kept you from meetings, work, or from getting things needed for daily living?: No   Home Assistive Devices / Equipment Home Equipment: Conservation officer, nature (2 wheels), Rollator (4 wheels), Grab bars - tub/shower   Prior Device Use: Indicate devices/aids used by the patient prior to current illness, exacerbation or injury?  cane   Current Functional Level Cognition   Arousal/Alertness: Awake/alert Overall Cognitive Status: No family/caregiver present to determine baseline cognitive functioning Orientation Level: Oriented to person, Disoriented to place, Oriented to time, Oriented to situation General Comments: Noting slower initiation, and incr time to follow through with tasks; Even so, pt will change the way he is approaching a task to be able to complete it (i.e. reached under bar of stedy to get teh wascloth when it was difficult to reach over); Continued excellent motivation and participation; Able to verbalize that he was able to stand better two sessions ago than today Attention: Sustained Sustained Attention: Impaired Sustained Attention Impairment: Functional complex    Extremity Assessment (includes Sensation/Coordination)   Upper Extremity Assessment: Generalized weakness LUE Deficits / Details: L UE AROM impaired, pt's daughter reports that pt has had decreased ROM at  baseline, arthritis LUE: Shoulder pain with ROM  Lower Extremity Assessment: Defer to PT evaluation     ADLs   Overall ADL's : Needs assistance/impaired Eating/Feeding: Set up, Supervision/ safety, Sitting Grooming: Wash/dry face, Wash/dry hands, Standing Grooming Details (indicate cue type and reason): Standing with Stedy at sink Upper Body Bathing: Minimal assistance, Standing Upper Body Bathing Details (indicate cue type and reason): standing at sink in [place] Lower Body Bathing: Maximal assistance Upper Body Dressing : Minimal assistance, Sitting Upper Body Dressing Details (indicate cue type and reason): Impaired dynamic sitting balannce Lower Body Dressing: Total assistance Toilet Transfer: Maximal assistance, +2 for safety/equipment Toilet Transfer Details (indicate cue type and reason): simulated, used Stedy for standing, 4 trials sit - stand from chair to [place] and Hygiene: Total assistance Functional mobility during ADLs: Maximal assistance, +2 for physical assistance     Mobility   Overal bed mobility: Needs Assistance Bed Mobility: Supine to Sit Supine to sit: Max assist General bed mobility comments: pt in chair up with PT uon arrival     Transfers   Overall transfer level: Needs assistance Equipment used: Ambulation equipment used Transfers: Sit to/from Stand, Bed to chair/wheelchair/BSC Sit to Stand: Max assist, Mod assist, +2 safety/equipment, +2 physical assistance Bed to/from chair/wheelchair/BSC transfer type:: Stand pivot Stand pivot transfers: Max assist, +2 physical assistance Transfer via Lift Equipment: Stedy General transfer comment: posterior lean persists, but able to initiate forward lean by reaching forward for the bar of the stedy; Has power to rise; multimodal cues to come forward for upright standing; initial sit to stand requiring +2 Max assist  for support due to the very heavy posterior bias; Performed serial sit <>  stands x4 with notable improvements in anterior weight shifts with multimodal cues (last 3 transfers were from elevated seat of the stedy)     Ambulation / Gait / Stairs / Wheelchair Mobility   Ambulation/Gait Ambulation/Gait assistance: Herbalist (Feet):  (March in place in stedy) Assistive device:  (heavily used bar of stedy) General Gait Details: Heavy use of bar for UE support, but able to pick up knees for high stepping as he got more comfortable Pre-gait activities: Unable to practice today due to difficulty maintaining stand     Posture / Balance Dynamic Sitting Balance Sitting balance - Comments: posterior imbalance Balance Overall balance assessment: Needs assistance Sitting-balance support: No upper extremity supported, Feet unsupported Sitting balance-Leahy Scale: Fair Sitting balance - Comments: posterior imbalance Postural control: Posterior lean Standing balance support: Bilateral upper extremity supported, During functional activity, Single extremity supported Standing balance-Leahy Scale: Poor Standing balance comment: static standing within stedy approximately with mod assist; held approximately 10 seconds before needing to sit     Special needs/care consideration Skin Abrasion: knee/left; Ecchymosis: arm/left, Bladder incontinence, External urinary catheter and Special service needs [place] interpreter    Previous Home Environment  Living Arrangements: Spouse/significant other  Lives With: Spouse Available Help at Discharge: Family, Personal care attendant, Available 24 hours/day Type of Home: [place] Name: [place]: One level Home Access: Level entry Bathroom Shower/Tub: Multimedia programmer: Handicapped height Bathroom Accessibility: Yes How Accessible: Accessible via walker, Accessible via wheelchair Home Care Services: No Additional Comments: daughter reoprts that pt has had  10+ falls   Discharge Living Setting Plans for Discharge Living Setting: Other (Comment) ([facility]/ALF) Type of Home at Discharge: Assisted living (if pt not able to d/c to ILF, daughter wants him d/c'd to ALF) [place] Name at Discharge: [facility] Discharge Home Layout: One level Discharge Home Access: Level entry Discharge Bathroom Shower/Tub: Walk-in shower Discharge Bathroom Toilet: Handicapped height Discharge Bathroom Accessibility: Yes How Accessible: Accessible via walker, Accessible via wheelchair Does the patient have any problems obtaining your medications?: No   Social/Family/Support Systems Patient Roles: Spouse Anticipated Caregiver: [name], daughter and facility staff Anticipated Caregiver's Contact Information: Betty:[phone] Caregiver Availability: 24/7 Discharge Plan Discussed with Primary Caregiver: Yes Is Caregiver In Agreement with Plan?: Yes Does Caregiver/Family have Issues with Lodging/Transportation while Pt is in Rehab?: No   Goals Patient/Family Goal for Rehab: Supervision-Min A: PT/OT/ST Expected length of stay: 18-20 days Pt/Family Agrees to Admission and willing to participate: Yes Program Orientation Provided & Reviewed with Pt/Caregiver Including Roles  & Responsibilities: Yes   Decrease burden of Care through IP rehab admission: NA   Possible need for SNF placement upon discharge: Not anticipated   Patient Condition: I have reviewed medical records from [facility], spoken with CSW, and patient and daughter. I met with patient at the bedside and discussed via phone for inpatient rehabilitation assessment.  Patient will benefit from ongoing PT, OT, and SLP, can actively participate in 3 hours of therapy a day 5 days of the week, and can make measurable gains during the admission.  Patient will also benefit from the coordinated team approach during an Inpatient Acute Rehabilitation admission.  The patient will  receive intensive therapy as well as Rehabilitation physician, nursing, social worker, and care management interventions.  Due to bladder management, safety, skin/wound care, disease management, medication administration, and  patient education the patient requires 24 hour a day rehabilitation nursing.  The patient is currently mod to max assist overall with mobility and basic ADLs.  Discharge setting and therapy post discharge at assisted living facility is anticipated.  Patient has agreed to participate in the Acute Inpatient Rehabilitation Program and will admit today.   Preadmission Screen Completed By:  Gayland Curry SLP with updates by Cleatrice Burke, 07/07/2021 10:05 AM ______________________________________________________________________   Discussed status with Dr. Dagoberto Ligas on 07/07/21 at 1006 and received approval for admission today.   Admission Coordinator:Lauren Lind Covert SLP with updates by Cleatrice Burke, RN, time  1006 Date 07/07/2021    Assessment/Plan: Diagnosis: Does the need for close, 24 hr/day Medical supervision in concert with the patient's rehab needs make it unreasonable for this patient to be served in a less intensive setting? Yes Co-Morbidities requiring supervision/potential complications: HTN, XVQ0G, colon cancer s/p hemicolectomy and chemo; RA, memory loss; R CVA Due to bladder management, bowel management, safety, skin/wound care, disease management, medication administration, pain management, and patient education, does the patient require 24 hr/day rehab nursing? Yes Does the patient require coordinated care of a physician, rehab nurse, PT, OT, and SLP to address physical and functional deficits in the context of the above medical diagnosis(es)? Yes Addressing deficits in the following areas: balance, endurance, locomotion, strength, transferring, bowel/bladder control, bathing, dressing, feeding, grooming, toileting, and cognition Can the  patient actively participate in an intensive therapy program of at least 3 hrs of therapy 5 days a week? Yes The potential for patient to make measurable gains while on inpatient rehab is good and fair Anticipated functional outcomes upon discharge from inpatient rehab: supervision and min assist PT, supervision and min assist OT, supervision and min assist SLP Estimated rehab length of stay to reach the above functional goals is: 18-20 days Anticipated discharge destination: Home 10. Overall Rehab/Functional Prognosis: good and fair     MD Signature:           Revision History                                              Note Details  Jan Fireman, MD File Time 07/07/2021 10:22 AM  Author Type Physician Status Signed  Last Editor Christian Heys, MD Service Physical Medicine and [place] # [id number] Admit Date 07/07/2021

## 2021-07-07 NOTE — H&P (Signed)
Physical Medicine and Rehabilitation Admission H&P    Chief Complaint  Patient presents with   Functional deficits due to stroke    HPI: [name] is a 88 year old [name] with history of HTN, colon cancer, gait disorder with frequent falls,  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  CT head negative. 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 MRI/MRA brain done revealing  right M1 high grade stenosis, chronic occlusion of distal R-VA, chronic 50% narrowing at L-VA origin, small acute perforator infarct in right corona radiata and extensive small vessel disease with brain atrophy. 2 D echo showed EF 60-65%, trivial MVR, mild AVR, no wall abnormality and trivial pericardial effusion.   Dr. Erlinda Hong felt that stroke was due to small vessel disease but questioned embolic phenomena causing new right M1 high grade stenosis and recommended 30 day event monitor after discharge to rule out A fib. ASA increased to 325 mg and plavix added with recs to continue DAPT X 3 months followed by Plavix alone.  Left shoulder films done due to pain since fall and showed degenerative changes L-AC joint with narrowing and spurring. Therapy initiated and patient with resultant left sided weakness, balance deficits with posterior lean, delayed processing with expressive deficits and language of confusion at times. CIR recommended due to functional decline.   LBM 2 days ago per daughterInez Lindsey.  Usually goes daily.  Voiding OK.  Denies pain.  No nausea  Review of Systems  Constitutional:  Negative for chills and fever.  HENT:  Negative for hearing loss and tinnitus.   Eyes:  Negative for blurred vision and double vision.  Respiratory:  Negative for shortness of breath.   Cardiovascular:  Negative for chest pain and leg swelling.  Gastrointestinal:  Positive for constipation. Negative  for heartburn and nausea.  Genitourinary:  Positive for urgency.  Musculoskeletal:  Positive for falls (frequent) and joint pain (left shoulder pain since fall).  Neurological:  Positive for speech change, focal weakness (had had intermittent left sided weakness since last fall) and weakness.  All other systems reviewed and are negative.   Past Surgical History:  Procedure Laterality Date   COLON SURGERY     Right / Adenocarcinoma / Chemo   COLONOSCOPY  2005, 2008   Dr. Lucia Gaskins   INGUINAL HERNIA REPAIR     Right   IR ANGIO INTRA EXTRACRAN SEL COM CAROTID INNOMINATE BILAT MOD SED  12/13/2017   IR ANGIO VERTEBRAL SEL VERTEBRAL BILAT MOD SED  12/13/2017   IR PTA INTRACRANIAL  12/18/2017   RADIOLOGY WITH ANESTHESIA N/A 12/18/2017   Procedure: Angioplasty with stenting;  Surgeon: Luanne Bras, MD;  Location: [facility];  Service: Radiology;  Laterality: N/A;    Family History  Problem Relation Age of Onset   Diabetes Neg Hx    Heart attack Neg Hx    Colon cancer Neg Hx    Prostate cancer Neg Hx     Social History: Married--Moved to [place] since Jan '23. Family had hired assistance since last ED visit due to falls. Per reports that he has never smoked. He used to work for [employer] in [place] then for [employer] been in [place] for 40 years. He has never used smokeless tobacco. He reports that he does not drink alcohol and does not use drugs.   Allergies  Allergen Reactions  Methotrexate Derivatives Other (See Comments)    Mouth sores / pancytopenia   Asa [Aspirin] Other (See Comments)    Gastric symptoms - can tolerate 81 mg aspirin   Penicillin G Rash    Did it involve swelling of the face/tongue/throat, SOB, or low BP? Unknown Did it involve sudden or severe rash/hives, skin peeling, or any reaction on the inside of your mouth or nose? Unknown Did you need to seek medical attention at a hospital or doctor's office? Unknown When did it last happen?   unknown    If all above  answers are NO, may proceed with cephalosporin use.    Medications Prior to Admission  Medication Sig Dispense Refill   aspirin EC 81 MG tablet Take 81 mg by mouth daily. Swallow whole.     docusate sodium (COLACE) 100 MG capsule Take 200 mg by mouth daily.     furosemide (LASIX) 20 MG tablet Take 1 tablet (20 mg total) by mouth daily. (Patient taking differently: Take 20 mg by mouth daily as needed for fluid or edema.) 90 tablet 1   guaiFENesin (MUCINEX) 600 MG 12 hr tablet Take 600 mg by mouth 2 (two) times daily as needed for cough or to loosen phlegm.     levothyroxine (SYNTHROID) 100 MCG tablet Take 1 tablet (100 mcg total) by mouth daily before breakfast. 90 tablet 1   loratadine (CLARITIN) 10 MG tablet Take 10 mg by mouth daily.     mirabegron ER (MYRBETRIQ) 25 MG TB24 tablet Take 25 mg by mouth at bedtime.     Multiple Vitamins-Minerals (MULTIVITAMIN ADULT) CHEW Chew 2 each by mouth daily.     NONFORMULARY OR COMPOUNDED ITEM 4 wheeled walker   #1  dx frequent falls, weakness 1 each 0   polyvinyl alcohol (LIQUIFILM TEARS) 1.4 % ophthalmic solution Place 1 drop into both eyes daily as needed for dry eyes.     traMADol (ULTRAM) 50 MG tablet Take 0.5 tablets (25 mg total) by mouth every 6 (six) hours as needed for severe pain. 30 tablet 0      Home: Home Living Family/patient expects to be discharged to:: Other (Comment) (ILF) Living Arrangements: Spouse/significant other Available Help at Discharge: Family, Personal care attendant, Available 24 hours/day Type of Home: Independent living facility Home Access: Level entry Home Layout: One level Bathroom Shower/Tub: Multimedia programmer: Handicapped height Bathroom Accessibility: Yes Home Equipment: Conservation officer, nature (2 wheels), Rollator (4 wheels), Grab bars - tub/shower Additional Comments: daughter reoprts that pt has had 10+ falls  Lives With: Spouse   Functional History: Prior Function Prior Level of Function :  Needs assist Physical Assist : ADLs (physical) ADLs (physical): Dressing Mobility Comments: has cane and RW but does not always use AD ADLs Comments: needed assist with socks and  shoes  Functional Status:  Mobility: Bed Mobility Overal bed mobility: Needs Assistance Bed Mobility: Supine to Sit Supine to sit: Max assist General bed mobility comments: pt in chair up with PT uon arrival Transfers Overall transfer level: Needs assistance Equipment used: Ambulation equipment used Transfers: Sit to/from Stand, Bed to chair/wheelchair/BSC Sit to Stand: Max assist, Mod assist, +2 safety/equipment, +2 physical assistance Bed to/from chair/wheelchair/BSC transfer type:: Stand pivot Stand pivot transfers: Max assist, +2 physical assistance Transfer via Lift Equipment: Stedy General transfer comment: posterior lean persists, but able to initiate forward lean by reaching forward for the bar of the stedy; Has power to rise; multimodal cues to come forward for upright standing; initial  sit to stand requiring +2 Max assist for support due to the very heavy posterior bias; Performed serial sit <> stands x4 with notable improvements in anterior weight shifts with multimodal cues (last 3 transfers were from elevated seat of the stedy) Ambulation/Gait Ambulation/Gait assistance: Min assist Gait Distance (Feet):  (March in place in stedy) Assistive device:  (heavily used bar of stedy) General Gait Details: Heavy use of bar for UE support, but able to pick up knees for high stepping as he got more comfortable Pre-gait activities: Unable to practice today due to difficulty maintaining stand    ADL: ADL Overall ADL's : Needs assistance/impaired Eating/Feeding: Set up, Supervision/ safety, Sitting Grooming: Wash/dry face, Wash/dry hands, Standing Grooming Details (indicate cue type and reason): Standing with Stedy at sink Upper Body Bathing: Minimal assistance, Standing Upper Body Bathing Details  (indicate cue type and reason): standing at sink in [place] Lower Body Bathing: Maximal assistance Upper Body Dressing : Minimal assistance, Sitting Upper Body Dressing Details (indicate cue type and reason): Impaired dynamic sitting balannce Lower Body Dressing: Total assistance Toilet Transfer: Maximal assistance, +2 for safety/equipment Toilet Transfer Details (indicate cue type and reason): simulated, used Stedy for standing, 4 trials sit - stand from chair to [place] and Hygiene: Total assistance Functional mobility during ADLs: Maximal assistance, +2 for physical assistance  Cognition: Cognition Overall Cognitive Status: No family/caregiver present to determine baseline cognitive functioning Arousal/Alertness: Awake/alert Orientation Level: Oriented to person, Disoriented to place, Oriented to time, Oriented to situation Attention: Sustained Sustained Attention: Impaired Sustained Attention Impairment: Functional complex Cognition Arousal/Alertness: Awake/alert Behavior During Therapy: WFL for tasks assessed/performed Overall Cognitive Status: No family/caregiver present to determine baseline cognitive functioning Problem Solving: Decreased initiation, Slow processing General Comments: Noting slower initiation, and incr time to follow through with tasks; Even so, pt will change the way he is approaching a task to be able to complete it (i.e. reached under bar of stedy to get teh wascloth when it was difficult to reach over); Continued excellent motivation and participation; Able to verbalize that he was able to stand better two sessions ago than today   Blood pressure 133/69, pulse 64, temperature (!) 97.4 F (36.3 C), temperature source Oral, resp. rate 18, SpO2 97 %. Physical Exam Vitals and nursing note reviewed. Exam conducted with a chaperone present.  Constitutional:      Appearance: Normal appearance.     Comments: Pt appear younger than  stated age- sitting up in bed; some english- daughter [name] in room and PA; NAD  HENT:     Head: Normocephalic and atraumatic.     Comments: L facial droop- tongue L facing    Nose: Nose normal. No congestion.     Mouth/Throat:     Mouth: Mucous membranes are dry.     Pharynx: Oropharynx is clear. No oropharyngeal exudate.  Eyes:     General:        Right eye: No discharge.        Left eye: No discharge.     Extraocular Movements: Extraocular movements intact.  Cardiovascular:     Rate and Rhythm: Normal rate and regular rhythm.     Heart sounds: Normal heart sounds. No murmur heard.   No gallop.  Pulmonary:     Effort: Pulmonary effort is normal. No respiratory distress.     Breath sounds: Normal breath sounds. No wheezing or rales.  Abdominal:     General: Bowel sounds are normal. There is no distension.  Palpations: Abdomen is soft.     Tenderness: There is no abdominal tenderness. There is no guarding.  Musculoskeletal:     Cervical back: Neck supple. No tenderness.     Comments: Left knee edema with resolving ecchymosis. Left shoulder discomfort with ROM.  LUE 4/5 except FA 3+/5 RUE 5/5 RLE- 5/5 LLE 5-/5   Skin:    General: Skin is warm and dry.     Comments: R forearm IV- looks OK  Neurological:     Mental Status: He is alert.     Comments: Soft hoarse voice. Tangential at times with expressive deficits. Confused at times and confused daughter's name with multiple family members.  Mild left facial weakness with left sided weakness.  Confused- but noted some perseveration and delayed responses Intact to light touch in all 4 extremities   Psychiatric:        Mood and Affect: Mood normal.        Behavior: Behavior normal.     Comments: pleasant    No results found for this or any previous visit (from the past 48 hour(s)). No results found.    Blood pressure 133/69, pulse 64, temperature (!) 97.4 F (36.3 C), temperature source Oral, resp. rate 18, SpO2 97  %.   Medical Problem List and Plan: 1. Functional deficits secondary to R corona radiata stroke with L hemiparesis  -patient may  shower  -ELOS/Goals: 18-20 days supervision to min A 2.  Antithrombotics: -DVT/anticoagulation:  Pharmaceutical: Lovenox  -antiplatelet therapy: DAPT X 3 months followed by plavix alone 3. Left shoulder pain/Pain Management: Will add Sportscreme to left shoulder and tylenol at bedtime. --Tylenol prn.  4. Mood: LCSW to follow for evaluation and support.   -antipsychotic agents: N/A 5. Neuropsych: This patient is not fully capable of making decisions on his own behalf. 6. Skin/Wound Care: Routine pressure relief measures.  7. Fluids/Electrolytes/Nutrition: Monitor I/O. Check  8. HTN: Monitor BP TID--continue 9. CKD III: Baseline SCr 1.7 to 1.8 range-->1.54   --Avoid nephrotoxic meds. Recheck CMET in am.  10. Hyperactive bladder w/nocturia: Has incontinence has been an issue with urgency-->does not like depends. Nocturia decreased with merabegron.  --toilet patient every 4 hours while awake.   -- 11.  RA: Has had shoulder pain on and off--uses tylenol prn.  12. Vitamin B 12 deficiency: B-12 341-->no on weekly supplement 13. Pre-diabetes: Hgb A1C-5.8. Monitor FBS with weekly checks. 14. Hypothyroid: Continue supplement.  15. Questionable constipation- has been 2 days- monitor closely.    I have personally performed a face to face diagnostic evaluation of this patient and formulated the key components of the plan.  Additionally, I have personally reviewed laboratory data, imaging studies, as well as relevant notes and concur with the physician assistant's documentation above.   The patient's status has not changed from the original H&P.  Any changes in documentation from the acute care chart have been noted above.     Bary Leriche, PA-C 07/07/2021

## 2021-07-07 NOTE — Progress Notes (Signed)
Patient arrived on unit, oriented to unit. Reviewed medications, therapy schedule, rehab routine and plan of care. States an understanding of information reviewed. No complications noted at this time. Patient reports no pain and is AX4 Abed Schar L Cavin Longman  

## 2021-07-07 NOTE — H&P (Signed)
Physical Medicine and Rehabilitation Admission H&P        Chief Complaint  Patient presents with   Functional deficits due to stroke      HPI: [name] is a 88 year old [name] with history of HTN, colon cancer, gait disorder with frequent falls,  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  CT head negative. 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 MRI/MRA brain done revealing  right M1 high grade stenosis, chronic occlusion of distal R-VA, chronic 50% narrowing at L-VA origin, small acute perforator infarct in right corona radiata and extensive small vessel disease with brain atrophy. 2 D echo showed EF 60-65%, trivial MVR, mild AVR, no wall abnormality and trivial pericardial effusion.    Dr. Erlinda Lindsey felt that stroke was due to small vessel disease but questioned embolic phenomena causing new right M1 high grade stenosis and recommended 30 day event monitor after discharge to rule out A fib. ASA increased to 325 mg and plavix added with recs to continue DAPT X 3 months followed by Plavix alone.  Left shoulder films done due to pain since fall and showed degenerative changes L-AC joint with narrowing and spurring. Therapy initiated and patient with resultant left sided weakness, balance deficits with posterior lean, delayed processing with expressive deficits and language of confusion at times. CIR recommended due to functional decline.    LBM 2 days ago per daughterInez Lindsey.  Usually goes daily.  Voiding OK.  Denies pain.  No nausea   Review of Systems  Constitutional:  Negative for chills and fever.  HENT:  Negative for hearing loss and tinnitus.   Eyes:  Negative for blurred vision and double vision.  Respiratory:  Negative for shortness of breath.   Cardiovascular:  Negative for chest pain and leg swelling.  Gastrointestinal:  Positive for  constipation. Negative for heartburn and nausea.  Genitourinary:  Positive for urgency.  Musculoskeletal:  Positive for falls (frequent) and joint pain (left shoulder pain since fall).  Neurological:  Positive for speech change, focal weakness (had had intermittent left sided weakness since last fall) and weakness.  All other systems reviewed and are negative.          Past Surgical History:  Procedure Laterality Date   COLON SURGERY        Right / Adenocarcinoma / Chemo   COLONOSCOPY   2005, 2008    Dr. Lucia Lindsey   INGUINAL HERNIA REPAIR        Right   IR ANGIO INTRA EXTRACRAN SEL COM CAROTID INNOMINATE BILAT MOD SED   12/13/2017   IR ANGIO VERTEBRAL SEL VERTEBRAL BILAT MOD SED   12/13/2017   IR PTA INTRACRANIAL   12/18/2017   RADIOLOGY WITH ANESTHESIA N/A 12/18/2017    Procedure: Angioplasty with stenting;  Surgeon: Christian Bras, MD;  Location: [facility];  Service: Radiology;  Laterality: N/A;           Family History  Problem Relation Age of Onset   Diabetes Neg Hx     Heart attack Neg Hx     Colon cancer Neg Hx     Prostate cancer Neg Hx        Social History: Married--Moved to [place] since Jan '23. Family had hired assistance since last ED visit due to falls. Per reports that he has never smoked. He used  to work for [employer] in [place] then for [employer] been in [place] for 40 years. He has never used smokeless tobacco. He reports that he does not drink alcohol and does not use drugs.          Allergies  Allergen Reactions   Methotrexate Derivatives Other (See Comments)      Mouth sores / pancytopenia   Asa [Aspirin] Other (See Comments)      Gastric symptoms - can tolerate 81 mg aspirin   Penicillin G Rash      Did it involve swelling of the face/tongue/throat, SOB, or low BP? Unknown Did it involve sudden or severe rash/hives, skin peeling, or any reaction on the inside of your mouth or nose? Unknown Did you need to seek medical attention at a hospital or  doctor's office? Unknown When did it last happen?   unknown    If all above answers are NO, may proceed with cephalosporin use.            Medications Prior to Admission  Medication Sig Dispense Refill   aspirin EC 81 MG tablet Take 81 mg by mouth daily. Swallow whole.       docusate sodium (COLACE) 100 MG capsule Take 200 mg by mouth daily.       furosemide (LASIX) 20 MG tablet Take 1 tablet (20 mg total) by mouth daily. (Patient taking differently: Take 20 mg by mouth daily as needed for fluid or edema.) 90 tablet 1   guaiFENesin (MUCINEX) 600 MG 12 hr tablet Take 600 mg by mouth 2 (two) times daily as needed for cough or to loosen phlegm.       levothyroxine (SYNTHROID) 100 MCG tablet Take 1 tablet (100 mcg total) by mouth daily before breakfast. 90 tablet 1   loratadine (CLARITIN) 10 MG tablet Take 10 mg by mouth daily.       mirabegron ER (MYRBETRIQ) 25 MG TB24 tablet Take 25 mg by mouth at bedtime.       Multiple Vitamins-Minerals (MULTIVITAMIN ADULT) CHEW Chew 2 each by mouth daily.       NONFORMULARY OR COMPOUNDED ITEM 4 wheeled walker   #1  dx frequent falls, weakness 1 each 0   polyvinyl alcohol (LIQUIFILM TEARS) 1.4 % ophthalmic solution Place 1 drop into both eyes daily as needed for dry eyes.       traMADol (ULTRAM) 50 MG tablet Take 0.5 tablets (25 mg total) by mouth every 6 (six) hours as needed for severe pain. 30 tablet 0          Home: Home Living Family/patient expects to be discharged to:: Other (Comment) (ILF) Living Arrangements: Spouse/significant other Available Help at Discharge: Family, Personal care attendant, Available 24 hours/day Type of Home: Independent living facility Home Access: Level entry Home Layout: One level Bathroom Shower/Tub: Multimedia programmer: Handicapped height Bathroom Accessibility: Yes Home Equipment: Conservation officer, nature (2 wheels), Rollator (4 wheels), Grab bars - tub/shower Additional Comments: daughter reoprts that pt has  had 10+ falls  Lives With: Spouse   Functional History: Prior Function Prior Level of Function : Needs assist Physical Assist : ADLs (physical) ADLs (physical): Dressing Mobility Comments: has cane and RW but does not always use AD ADLs Comments: needed assist with socks and  shoes   Functional Status:  Mobility: Bed Mobility Overal bed mobility: Needs Assistance Bed Mobility: Supine to Sit Supine to sit: Max assist General bed mobility comments: pt in chair up with PT uon arrival Transfers Overall  transfer level: Needs assistance Equipment used: Ambulation equipment used Transfers: Sit to/from Stand, Bed to chair/wheelchair/BSC Sit to Stand: Max assist, Mod assist, +2 safety/equipment, +2 physical assistance Bed to/from chair/wheelchair/BSC transfer type:: Stand pivot Stand pivot transfers: Max assist, +2 physical assistance Transfer via Lift Equipment: Stedy General transfer comment: posterior lean persists, but able to initiate forward lean by reaching forward for the bar of the stedy; Has power to rise; multimodal cues to come forward for upright standing; initial sit to stand requiring +2 Max assist for support due to the very heavy posterior bias; Performed serial sit <> stands x4 with notable improvements in anterior weight shifts with multimodal cues (last 3 transfers were from elevated seat of the stedy) Ambulation/Gait Ambulation/Gait assistance: Min assist Gait Distance (Feet):  (March in place in stedy) Assistive device:  (heavily used bar of stedy) General Gait Details: Heavy use of bar for UE support, but able to pick up knees for high stepping as he got more comfortable Pre-gait activities: Unable to practice today due to difficulty maintaining stand   ADL: ADL Overall ADL's : Needs assistance/impaired Eating/Feeding: Set up, Supervision/ safety, Sitting Grooming: Wash/dry face, Wash/dry hands, Standing Grooming Details (indicate cue type and reason): Standing  with Stedy at sink Upper Body Bathing: Minimal assistance, Standing Upper Body Bathing Details (indicate cue type and reason): standing at sink in [place] Lower Body Bathing: Maximal assistance Upper Body Dressing : Minimal assistance, Sitting Upper Body Dressing Details (indicate cue type and reason): Impaired dynamic sitting balannce Lower Body Dressing: Total assistance Toilet Transfer: Maximal assistance, +2 for safety/equipment Toilet Transfer Details (indicate cue type and reason): simulated, used Stedy for standing, 4 trials sit - stand from chair to [place] and Hygiene: Total assistance Functional mobility during ADLs: Maximal assistance, +2 for physical assistance   Cognition: Cognition Overall Cognitive Status: No family/caregiver present to determine baseline cognitive functioning Arousal/Alertness: Awake/alert Orientation Level: Oriented to person, Disoriented to place, Oriented to time, Oriented to situation Attention: Sustained Sustained Attention: Impaired Sustained Attention Impairment: Functional complex Cognition Arousal/Alertness: Awake/alert Behavior During Therapy: WFL for tasks assessed/performed Overall Cognitive Status: No family/caregiver present to determine baseline cognitive functioning Problem Solving: Decreased initiation, Slow processing General Comments: Noting slower initiation, and incr time to follow through with tasks; Even so, pt will change the way he is approaching a task to be able to complete it (i.e. reached under bar of stedy to get teh wascloth when it was difficult to reach over); Continued excellent motivation and participation; Able to verbalize that he was able to stand better two sessions ago than today     Blood pressure 133/69, pulse 64, temperature (!) 97.4 F (36.3 C), temperature source Oral, resp. rate 18, SpO2 97 %. Physical Exam Vitals and nursing note reviewed. Exam conducted with a chaperone  present.  Constitutional:      Appearance: Normal appearance.     Comments: Pt appear younger than stated age- sitting up in bed; some english- daughter [name] in room and PA; NAD  HENT:     Head: Normocephalic and atraumatic.     Comments: L facial droop- tongue L facing    Nose: Nose normal. No congestion.     Mouth/Throat:     Mouth: Mucous membranes are dry.     Pharynx: Oropharynx is clear. No oropharyngeal exudate.  Eyes:     General:        Right eye: No discharge.        Left eye:  No discharge.     Extraocular Movements: Extraocular movements intact.  Cardiovascular:     Rate and Rhythm: Normal rate and regular rhythm.     Heart sounds: Normal heart sounds. No murmur heard.   No gallop.  Pulmonary:     Effort: Pulmonary effort is normal. No respiratory distress.     Breath sounds: Normal breath sounds. No wheezing or rales.  Abdominal:     General: Bowel sounds are normal. There is no distension.     Palpations: Abdomen is soft.     Tenderness: There is no abdominal tenderness. There is no guarding.  Musculoskeletal:     Cervical back: Neck supple. No tenderness.     Comments: Left knee edema with resolving ecchymosis. Left shoulder discomfort with ROM.  LUE 4/5 except FA 3+/5 RUE 5/5 RLE- 5/5 LLE 5-/5   Skin:    General: Skin is warm and dry.     Comments: R forearm IV- looks OK  Neurological:     Mental Status: He is alert.     Comments: Soft hoarse voice. Tangential at times with expressive deficits. Confused at times and confused daughter's name with multiple family members.  Mild left facial weakness with left sided weakness.  Confused- but noted some perseveration and delayed responses Intact to light touch in all 4 extremities   Psychiatric:        Mood and Affect: Mood normal.        Behavior: Behavior normal.     Comments: pleasant      Lab Results Last 48 Hours  No results found for this or any previous visit (from the past 48 hour(s)).   Imaging  Results (Last 48 hours)  No results found.         Blood pressure 133/69, pulse 64, temperature (!) 97.4 F (36.3 C), temperature source Oral, resp. rate 18, SpO2 97 %.     Medical Problem List and Plan: 1. Functional deficits secondary to R corona radiata stroke with L hemiparesis             -patient may  shower             -ELOS/Goals: 18-20 days supervision to min A 2.  Antithrombotics: -DVT/anticoagulation:  Pharmaceutical: Lovenox             -antiplatelet therapy: DAPT X 3 months followed by plavix alone 3. Left shoulder pain/Pain Management: Will add Sportscreme to left shoulder and tylenol at bedtime. --Tylenol prn.  4. Mood: LCSW to follow for evaluation and support.              -antipsychotic agents: N/A 5. Neuropsych: This patient is not fully capable of making decisions on his own behalf. 6. Skin/Wound Care: Routine pressure relief measures.  7. Fluids/Electrolytes/Nutrition: Monitor I/O. Check  8. HTN: Monitor BP TID--continue 9. CKD III: Baseline SCr 1.7 to 1.8 range-->1.54              --Avoid nephrotoxic meds. Recheck CMET in am.  10. Hyperactive bladder w/nocturia: Has incontinence has been an issue with urgency-->does not like depends. Nocturia decreased with merabegron.  --toilet patient every 4 hours while awake.   -- 11.  RA: Has had shoulder pain on and off--uses tylenol prn.  12. Vitamin B 12 deficiency: B-12 341-->no on weekly supplement 13. Pre-diabetes: Hgb A1C-5.8. Monitor FBS with weekly checks. 14. Hypothyroid: Continue supplement.  15. Questionable constipation- has been 2 days- monitor closely.      I have  personally performed a face to face diagnostic evaluation of this patient and formulated the key components of the plan.  Additionally, I have personally reviewed laboratory data, imaging studies, as well as relevant notes and concur with the physician assistant's documentation above.   The patient's status has not changed from the original  H&P.  Any changes in documentation from the acute care chart have been noted above.       Bary Leriche, PA-C 07/07/2021

## 2021-07-08 LAB — CBC WITH DIFFERENTIAL/PLATELET
Abs Immature Granulocytes: 0.06 10*3/uL (ref 0.00–0.07)
Basophils Absolute: 0.1 10*3/uL (ref 0.0–0.1)
Basophils Relative: 1 %
Eosinophils Absolute: 0.4 10*3/uL (ref 0.0–0.5)
Eosinophils Relative: 5 %
HCT: 35.7 % — ABNORMAL LOW (ref 39.0–52.0)
Hemoglobin: 12 g/dL — ABNORMAL LOW (ref 13.0–17.0)
Immature Granulocytes: 1 %
Lymphocytes Relative: 20 %
Lymphs Abs: 1.8 10*3/uL (ref 0.7–4.0)
MCH: 30.1 pg (ref 26.0–34.0)
MCHC: 33.6 g/dL (ref 30.0–36.0)
MCV: 89.5 fL (ref 80.0–100.0)
Monocytes Absolute: 0.9 10*3/uL (ref 0.1–1.0)
Monocytes Relative: 10 %
Neutro Abs: 5.8 10*3/uL (ref 1.7–7.7)
Neutrophils Relative %: 63 %
Platelets: 224 10*3/uL (ref 150–400)
RBC: 3.99 MIL/uL — ABNORMAL LOW (ref 4.22–5.81)
RDW: 12.7 % (ref 11.5–15.5)
WBC: 9.1 10*3/uL (ref 4.0–10.5)
nRBC: 0 % (ref 0.0–0.2)

## 2021-07-08 LAB — COMPREHENSIVE METABOLIC PANEL
ALT: 15 U/L (ref 0–44)
AST: 19 U/L (ref 15–41)
Albumin: 3.1 g/dL — ABNORMAL LOW (ref 3.5–5.0)
Alkaline Phosphatase: 62 U/L (ref 38–126)
Anion gap: 9 (ref 5–15)
BUN: 38 mg/dL — ABNORMAL HIGH (ref 8–23)
CO2: 24 mmol/L (ref 22–32)
Calcium: 9.3 mg/dL (ref 8.9–10.3)
Chloride: 106 mmol/L (ref 98–111)
Creatinine, Ser: 1.7 mg/dL — ABNORMAL HIGH (ref 0.61–1.24)
GFR, Estimated: 38 mL/min — ABNORMAL LOW (ref >60)
Glucose, Bld: 116 mg/dL — ABNORMAL HIGH (ref 70–99)
Potassium: 4 mmol/L (ref 3.5–5.1)
Sodium: 139 mmol/L (ref 135–145)
Total Bilirubin: 0.3 mg/dL (ref 0.3–1.2)
Total Protein: 7 g/dL (ref 6.5–8.1)

## 2021-07-08 MED ORDER — ACETAMINOPHEN 325 MG PO TABS: 325.0000 mg | ORAL_TABLET | ORAL | Status: DC | PRN

## 2021-07-08 MED ORDER — ENOXAPARIN SODIUM 30 MG/0.3ML IJ SOSY
30.0000 mg | PREFILLED_SYRINGE | INTRAMUSCULAR | Status: DC
Start: 2021-07-09 — End: 2021-07-29
  Administered 2021-07-09 – 2021-07-29 (×21): 30 mg via SUBCUTANEOUS
  Filled 2021-07-08 (×21): qty 0.3

## 2021-07-08 NOTE — Plan of Care (Signed)
Problem: RH Balance Goal: LTG: Patient will maintain dynamic sitting balance (OT) Description: LTG:  Patient will maintain dynamic sitting balance with assistance during activities of daily living (OT) Flowsheets (Taken 07/08/2021 1251) LTG: Pt will maintain dynamic sitting balance during ADLs with: Supervision/Verbal cueing Goal: LTG Patient will maintain dynamic standing with ADLs (OT) Description: LTG:  Patient will maintain dynamic standing balance with assist during activities of daily living (OT)  Flowsheets (Taken 07/08/2021 1251) LTG: Pt will maintain dynamic standing balance during ADLs with: Contact Guard/Touching assist   Problem: Sit to Stand Goal: LTG:  Patient will perform sit to stand in prep for activites of daily living with assistance level (OT) Description: LTG:  Patient will perform sit to stand in prep for activites of daily living with assistance level (OT) Flowsheets (Taken 07/08/2021 1251) LTG: PT will perform sit to stand in prep for activites of daily living with assistance level: Contact Guard/Touching assist   Problem: RH Grooming Goal: LTG Patient will perform grooming w/assist,cues/equip (OT) Description: LTG: Patient will perform grooming with assist, with/without cues using equipment (OT) Flowsheets (Taken 07/08/2021 1251) LTG: Pt will perform grooming with assistance level of: Supervision/Verbal cueing   Problem: RH Bathing Goal: LTG Patient will bathe all body parts with assist levels (OT) Description: LTG: Patient will bathe all body parts with assist levels (OT) Flowsheets (Taken 07/08/2021 1251) LTG: Pt will perform bathing with assistance level/cueing: Contact Guard/Touching assist   Problem: RH Dressing Goal: LTG Patient will perform upper body dressing (OT) Description: LTG Patient will perform upper body dressing with assist, with/without cues (OT). Flowsheets (Taken 07/08/2021 1251) LTG: Pt will perform upper body dressing with assistance level  of: Supervision/Verbal cueing Goal: LTG Patient will perform lower body dressing w/assist (OT) Description: LTG: Patient will perform lower body dressing with assist, with/without cues in positioning using equipment (OT) Flowsheets (Taken 07/08/2021 1251) LTG: Pt will perform lower body dressing with assistance level of: Minimal Assistance - Patient > 75%   Problem: RH Toileting Goal: LTG Patient will perform toileting task (3/3 steps) with assistance level (OT) Description: LTG: Patient will perform toileting task (3/3 steps) with assistance level (OT)  Flowsheets (Taken 07/08/2021 1251) LTG: Pt will perform toileting task (3/3 steps) with assistance level: Contact Guard/Touching assist   Problem: RH Functional Use of Upper Extremity Goal: LTG Patient will use RT/LT upper extremity as a (OT) Description: LTG: Patient will use right/left upper extremity as a stabilizer/gross assist/diminished/nondominant/dominant level with assist, with/without cues during functional activity (OT) Flowsheets (Taken 07/08/2021 1251) LTG: Use of upper extremity in functional activities: LUE as nondominant level LTG: Pt will use upper extremity in functional activity with assistance level of: Supervision/Verbal cueing   Problem: RH Toilet Transfers Goal: LTG Patient will perform toilet transfers w/assist (OT) Description: LTG: Patient will perform toilet transfers with assist, with/without cues using equipment (OT) Flowsheets (Taken 07/08/2021 1251) LTG: Pt will perform toilet transfers with assistance level of: Contact Guard/Touching assist   Problem: RH Tub/Shower Transfers Goal: LTG Patient will perform tub/shower transfers w/assist (OT) Description: LTG: Patient will perform tub/shower transfers with assist, with/without cues using equipment (OT) Flowsheets (Taken 07/08/2021 1251) LTG: Pt will perform tub/shower stall transfers with assistance level of: Minimal Assistance - Patient > 75%   Problem: RH  Attention Goal: LTG Patient will demonstrate this level of attention during functional activites (OT) Description: LTG:  Patient will demonstrate this level of attention during functional activites  (OT) Flowsheets (Taken 07/08/2021 1251) Patient will demonstrate this level of  attention during functional activites: Sustained Patient will demonstrate above attention level in the following environment: Controlled LTG: Patient will demonstrate this level of attention during functional activites (OT): Minimal Assistance - Patient > 75%

## 2021-07-08 NOTE — Progress Notes (Signed)
Inpatient Rehabilitation Care Coordinator Assessment and Plan Patient Details  Name: [name] MRN: [id number] Date of Birth: 05-23-1928  Today's Date: 07/08/2021  Hospital Problems: Principal Problem:   Stroke (cerebrum) [facility])  Past Medical History:  Past Medical History:  Diagnosis Date   Adenocarcinoma ([place]) 1985   colon s/p right hemicolectomy and chemotherapy. F/U with cancer center and Dr. Lucia Gaskins rotinely   Depression    Eczema    Epistaxis    f/u per ENT   Erectile dysfunction    Gastric ulcer 12/11   H-Pylori Tx, EGD 3-12: gastritis   Hay fever    Head injury 11/2017   with fall   Hyperlipemia    Hypertension    Hypothyroidism    Hypothyroid   Insomnia    Osteopenia     per DEXA 07-2008 (Rx fosamax)   Rheumatoid arthritis ([place])    Stroke ([place])     " balance issue"   Past Surgical History:  Past Surgical History:  Procedure Laterality Date   COLON SURGERY     Right / Adenocarcinoma / Chemo   COLONOSCOPY  2005, 2008   Dr. Lucia Gaskins   INGUINAL HERNIA REPAIR     Right   IR ANGIO INTRA EXTRACRAN SEL COM CAROTID INNOMINATE BILAT MOD SED  12/13/2017   IR ANGIO VERTEBRAL SEL VERTEBRAL BILAT MOD SED  12/13/2017   IR PTA INTRACRANIAL  12/18/2017   RADIOLOGY WITH ANESTHESIA N/A 12/18/2017   Procedure: Angioplasty with stenting;  Surgeon: Luanne Bras, MD;  Location: [place] [facility];  Service: Radiology;  Laterality: N/A;   Social History:  reports that he has never smoked. He has never used smokeless tobacco. He reports that he does not drink alcohol and does not use drugs.  Family / Support Systems Marital Status: Married Patient Roles: Spouse, Parent Spouse/Significant Other: Tax adviser (Spouse) Children: Environmental education officer (Daughter) Anticipated Caregiver: [name] Ability/Limitations of Caregiver: none Caregiver Availability: 24/7 Family Dynamics: support from children  Social History Preferred language: [place] Religion: Buddhist Cultural Background: Tax adviser  - How often do you need to have someone help you when you read instructions, pamphlets, or other written material from your doctor or pharmacy?: Never Writes: Yes Employment Status: Unemployed Public relations account executive Issues: n/a Guardian/Conservator: Agricultural consultant   Abuse/Neglect Abuse/Neglect Assessment Can Be Completed: Yes Physical Abuse: Denies Verbal Abuse: Denies Sexual Abuse: Denies Exploitation of patient/patient's resources: Denies Self-Neglect: Denies  Patient response to: Social Isolation - How often do you feel lonely or isolated from those around you?: Never  Emotional Status Recent Psychosocial Issues: coping Psychiatric History: hx of depression Substance Abuse History: n/a  Patient / Family Perceptions, Expectations & Goals Pt/Family understanding of illness & functional limitations: yes Premorbid pt/family roles/activities: independent required some assistance with medication management Anticipated changes in roles/activities/participation: plans to d/c back to ILF/ALF Pt/family expectations/goals: Supervision to [employer]: None Premorbid Home Care/DME Agencies: Other (Comment) Librarian, academic, Radiation protection practitioner, Education officer, environmental) Transportation available at discharge: family able to transport Is the patient able to respond to transportation needs?: Yes In the past 12 months, has lack of transportation kept you from medical appointments or from getting medications?: No In the past 12 months, has lack of transportation kept you from meetings, work, or from getting things needed for daily living?: No  Discharge Planning Living Arrangements: Spouse/significant other Support Systems: Children, Spouse/significant other Type of Residence: Independent Living Aeronautical engineer) [place] Name: Other (enter name of facility below) Care Facility [facility]: [facility]  Insurance Resources: Multimedia programmer (specify) Sports administrator  MEDICARE) Financial Resources: Family Support Financial Screen Referred: No Living Expenses: Other (Comment) Money Management: Spouse, Other (Comment) Does the patient have any problems obtaining your medications?: No Home Management: Facility assisted patient with medications Patient/Family Preliminary Plans: plan to cont Care Coordinator Barriers to Discharge: Decreased caregiver support, Lack of/limited family support, Insurance for SNF coverage Care Coordinator Anticipated Follow Up Needs: HH/OP, ALF/IL DC Planning Additional Notes/Comments: If unable to return to ILF, transition to ALF Expected length of stay: 18-20 Days  Clinical Impression SW spoke with patient daughter, introduced self, explained role and addressed questions and concerns. Daughter traveling from [place], plans to assist patient at discharge. No additional questions or concerns.  [name] 07/08/2021, 12:25 PM

## 2021-07-08 NOTE — Discharge Instructions (Addendum)
Inpatient Rehab Discharge Instructions  [name] Discharge date and time:  07/29/21  Activities/Precautions/ Functional Status: Activity: no lifting, driving, or strenuous exercise till cleared by MD Diet: cardiac diet and diabetic diet. Drink plenty of fluids.  Wound Care: none needed   Functional status:  ___ No restrictions     ___ Walk up steps independently _X__ 24/7 supervision/assistance   ___ Walk up steps with assistance ___ Intermittent supervision/assistance  ___ Bathe/dress independently ___ Walk with walker     ___ Bathe/dress with assistance ___ Walk Independently    ___ Shower independently ___ Walk with assistance    _X__ Shower with assistance _X__ No alcohol     ___ Return to work/school ________   COMMUNITY REFERRALS UPON DISCHARGE:    Home Health:   PT     OT     ST                   Agency: Orders sent to d/c Facility    Medical Equipment/Items Ordered: Armed forces operational officer                                                 Agency/Supplier: Risk analyst [phone]   Special Instructions:   STROKE/TIA DISCHARGE INSTRUCTIONS SMOKING Cigarette smoking nearly doubles your risk of having a stroke & is the single most alterable risk factor  If you smoke or have smoked in the last 12 months, you are advised to quit smoking for your health. Most of the excess cardiovascular risk related to smoking disappears within a year of stopping. Ask you doctor about anti-smoking medications [place] Quit Line: 1-800-QUIT NOW Free Smoking Cessation Classes [phone]  CHOLESTEROL Know your levels; limit fat & cholesterol in your diet  Lipid Panel     Component Value Date/Time   CHOL 173 06/29/2021 2154   TRIG 170 (H) 06/29/2021 2154   HDL 37 (L) 06/29/2021 2154   CHOLHDL 4.7 06/29/2021 2154   VLDL 34 06/29/2021 2154   LDLCALC 102 (H) 06/29/2021 2154     Many patients benefit from treatment even if their cholesterol is at goal. Goal: Total Cholesterol (CHOL) less  than 160 Goal:  Triglycerides (TRIG) less than 150 Goal:  HDL greater than 40 Goal:  LDL (LDLCALC) less than 100   BLOOD PRESSURE American Stroke Association blood pressure target is less that 120/80 mm/Hg  Your discharge blood pressure is:  BP: (!) 138/55 Monitor your blood pressure Limit your salt and alcohol intake Many individuals will require more than one medication for high blood pressure  DIABETES (A1c is a blood sugar average for last 3 months) Goal HGBA1c is under 7% (HBGA1c is blood sugar average for last 3 months)  Diabetes: Pre-diabetes    Lab Results  Component Value Date   HGBA1C 5.8 (H) 06/29/2021    Your HGBA1c can be lowered with medications, healthy diet, and exercise. Check your blood sugar as directed by your physician Call your physician if you experience unexplained or low blood sugars.  PHYSICAL ACTIVITY/REHABILITATION Goal is 30 minutes at least 4 days per week  Activity: Increase activity slowly, and No driving, Therapies: see above Return to work: N/A Activity decreases your risk of heart attack and stroke and makes your heart stronger.  It helps control your weight and blood pressure; helps you relax and can improve  your mood. Participate in a regular exercise program. Talk with your doctor about the best form of exercise for you (dancing, walking, swimming, cycling).  DIET/WEIGHT Goal is to maintain a healthy weight  Your discharge diet is:  Diet Order             Diet Heart Room service appropriate? Yes; Fluid consistency: Thin  Diet effective now                   liquids Your height is:  Height: 4\' 11"  (149.9 cm) Your current weight is: Weight: 54.8 kg Your Body Mass Index (BMI) is:  BMI (Calculated): 24.39 Following the type of diet specifically designed for you will help prevent another stroke. You are at goal weight   Your goal Body Mass Index (BMI) is 19-24. Healthy food habits can help reduce 3 risk factors for stroke:  High  cholesterol, hypertension, and excess weight.  RESOURCES Stroke/Support Group:  Call [phone]   STROKE EDUCATION PROVIDED/REVIEWED AND GIVEN TO PATIENT Stroke warning signs and symptoms How to activate emergency medical system (call 911). Medications prescribed at discharge. Need for follow-up after discharge. Personal risk factors for stroke. Pneumonia vaccine given:  Flu vaccine given:  My questions have been answered, the writing is legible, and I understand these instructions.  I will adhere to these goals & educational materials that have been provided to me after my discharge from the hospital.      My questions have been answered and I understand these instructions. I will adhere to these goals and the provided educational materials after my discharge from the hospital.  Patient/Caregiver Signature _______________________________ Date __________  Clinician Signature _______________________________________ Date __________  Please bring this form and your medication list with you to all your follow-up doctor's appointments.

## 2021-07-08 NOTE — Progress Notes (Signed)
Inpatient Rehabilitation  Patient information reviewed and entered into eRehab system by Kenzleigh Sedam Luticia Tadros, OTR/L.   Information including medical coding, functional ability and quality indicators will be reviewed and updated through discharge.    

## 2021-07-08 NOTE — IPOC Note (Addendum)
Overall Plan of Care [facility]) Patient Details Name: [name] MRN: [id number] DOB: 1929/02/18  Admitting Diagnosis: Stroke (cerebrum) [facility])  Hospital Problems: Principal Problem:   Stroke (cerebrum) [facility])     Functional Problem List: Nursing Bladder, Bowel, Medication Management, Safety, Pain, Endurance  PT Balance, Behavior, Edema, Endurance, Motor, Pain, Perception, Safety, Sensory, Skin Integrity  OT Balance, Cognition, Endurance, Motor, Safety, Perception, Vision  SLP Cognition  TR         Basic ADLs: OT Grooming, Bathing, Dressing, Toileting     Advanced  ADLs: OT       Transfers: PT Bed Mobility, Bed to Chair, Car, [employer], Floor  OT Toilet, Tub/Shower     Locomotion: PT Ambulation, Emergency planning/management officer, Stairs     Additional Impairments: OT Fuctional Use of Upper Extremity  SLP Communication, Social Cognition comprehension, expression Memory, Attention, Problem Solving, Awareness  TR      Anticipated Outcomes Item Anticipated Outcome  Self Feeding set-up A  Swallowing      Basic self-care  min A  Toileting  CGA   Bathroom Transfers CGA  Bowel/Bladder  manage bowel and bladder with mod I assist  Transfers  Min assist with LRAD  Locomotion  ambulatory min assist with LRAD  Communication     Cognition  sup-to-min A  Pain  pain at or below level 4 with prns  Safety/Judgment  maintain safety with cues   Therapy Plan: PT Intensity: Minimum of 1-2 x/day ,45 to 90 minutes PT Frequency: 5 out of 7 days PT Duration Estimated Length of Stay: 18-21 days OT Intensity: Minimum of 1-2 x/day, 45 to 90 minutes OT Frequency: 5 out of 7 days OT Duration/Estimated Length of Stay: 18 to 21 days SLP Intensity: Minumum of 1-2 x/day, 30 to 90 minutes SLP Frequency: 3 to 5 out of 7 days SLP Duration/Estimated Length of Stay: 18-21 days   Due to the current state of emergency, patients may not be receiving their 3-hours of Medicare-mandated therapy.    Team Interventions: Nursing Interventions Bladder Management, Disease Management/Prevention, Medication Management, Discharge Planning, Pain Management, Bowel Management, Patient/Family Education  PT interventions Ambulation/gait training, Discharge planning, Psychosocial support, Therapeutic Activities, Functional mobility training, Visual/perceptual remediation/compensation, Balance/vestibular training, Disease management/prevention, Neuromuscular re-education, Skin care/wound management, Therapeutic Exercise, Wheelchair propulsion/positioning, Cognitive remediation/compensation, DME/adaptive equipment instruction, Pain management, Splinting/orthotics, Community reintegration, Barrister's clerk education, IT trainer, UE/LE Coordination activities, UE/LE Strength taining/ROM, Functional electrical stimulation  OT Interventions Training and development officer, Disease mangement/prevention, Neuromuscular re-education, Self Care/advanced ADL retraining, Therapeutic Exercise, Wheelchair propulsion/positioning, UE/LE Strength taining/ROM, Pain management, Cognitive remediation/compensation, DME/adaptive equipment instruction, Skin care/wound managment, UE/LE Coordination activities, Splinting/orthotics, Patient/family education, Functional electrical stimulation, Community reintegration, Discharge planning, Functional mobility training, Therapeutic Activities, Visual/perceptual remediation/compensation, Psychosocial support  SLP Interventions Cueing hierarchy, Cognitive remediation/compensation, Internal/external aids, Functional tasks, Patient/family education, Therapeutic Activities  TR Interventions    SW/CM Interventions Discharge Planning, Psychosocial Support, Patient/Family Education, Disease Management/Prevention   Barriers to Discharge MD  Medical stability  Nursing Decreased caregiver support, Incontinence ILF 1 level; level entry w spouse  PT Inaccessible home environment, Decreased caregiver  support, Home environment Child psychotherapist, Insurance underwriter for SNF coverage, Behavior    OT Inaccessible home environment, Insurance for SNF coverage, Home environment access/layout    SLP      SW Decreased caregiver support, Lack of/limited family support, Insurance underwriter for SNF coverage     Team Discharge Planning: Destination: PT-Assisted Living ,OT- Home , SLP-Assisted Living (daughter reports patient is on waitlist for assisted living and hopeful he can discharge  there) Projected Follow-up: PT-Home health PT, OT-  Home health OT, SLP-Home Health SLP, 24 hour supervision/assistance Projected Equipment Needs: PT-Rolling walker with 5" wheels, To be determined, OT- To be determined, SLP-None recommended by SLP Equipment Details: PT- , OT-  Patient/family involved in discharge planning: PT- Patient,  OT-Patient, SLP-Patient, Family member/caregiver  MD ELOS: 70 to 21 days Medical Rehab Prognosis:  Good Assessment:  [name] with history of HTN, colon cancer, gait disorder with frequent falls,  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  CT head negative. 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 MRI/MRA brain done revealing  right M1 high grade stenosis, chronic occlusion of distal R-VA, chronic 50% narrowing at L-VA origin, small acute perforator infarct in right corona radiata and extensive small vessel disease with brain atrophy. 2 D echo showed EF 60-65%, trivial MVR, mild AVR, no wall abnormality and trivial pericardial effusion.    Dr. Erlinda Hong felt that stroke was due to small vessel disease but questioned embolic phenomena causing new right M1 high grade stenosis and recommended 30 day event monitor after discharge to rule out A fib. ASA increased to 325 mg and plavix added with recs to continue DAPT X 3 months followed by Plavix alone.  Left shoulder films done  due to pain since fall and showed degenerative changes L-AC joint with narrowing and spurring. Therapy initiated and patient with resultant left sided weakness, balance deficits with posterior lean, delayed processing with expressive deficits and language of confusion at times. CIR recommended due to functional decline.       See Team Conference Notes for weekly updates to the plan of care

## 2021-07-08 NOTE — Progress Notes (Signed)
PROGRESS NOTE   Subjective/Complaints:    Objective:   No results found. Recent Labs    07/08/21 0553  WBC 9.1  HGB 12.0*  HCT 35.7*  PLT 224   Recent Labs    07/08/21 0553  NA 139  K 4.0  CL 106  CO2 24  GLUCOSE 116*  BUN 38*  CREATININE 1.70*  CALCIUM 9.3    Intake/Output Summary (Last 24 hours) at 07/08/2021 0751 Last data filed at 07/08/2021 0522 Gross per 24 hour  Intake --  Output 100 ml  Net -100 ml        Physical Exam: Vital Signs Blood pressure (!) 149/61, pulse 68, temperature 97.7 F (36.5 C), temperature source Oral, resp. rate 14, height 4\' 11"  (1.499 m), weight 55.5 kg, SpO2 93 %.   General: No acute distress Mood and affect are appropriate Heart: Regular rate and rhythm no rubs murmurs or extra sounds Lungs: Clear to auscultation, breathing unlabored, no rales or wheezes Abdomen: Positive bowel sounds, soft nontender to palpation, nondistended Extremities: No clubbing, cyanosis, or edema Skin: No evidence of breakdown, no evidence of rash Neurologic: Cranial nerves II through XII intact, motor strength is 5/5 in Right and4/5 L deltoid, bicep, tricep, grip, hip flexor, knee extensors, ankle dorsiflexor and plantar flexor Sensory exam normal sensation to light touch and proprioception in bilateral upper and lower extremities Cerebellar exam normal finger to nose to finger as well as heel to shin in bilateral upper and lower extremities Musculoskeletal: reduced Left hip abd/add, IR/ER . No joint swelling   Assessment/Plan: 1. Functional deficits which require 3+ hours per day of interdisciplinary therapy in a comprehensive inpatient rehab setting. Physiatrist is providing close team supervision and 24 hour management of active medical problems listed below. Physiatrist and rehab team continue to assess barriers to discharge/monitor patient progress toward functional and medical  goals  Care Tool:  Bathing              Bathing assist       Upper Body Dressing/Undressing Upper body dressing   What is the patient wearing?: Button up shirt    Upper body assist Assist Level: Maximal Assistance - Patient 25 - 49%    Lower Body Dressing/Undressing Lower body dressing      What is the patient wearing?: Pants     Lower body assist Assist for lower body dressing: Maximal Assistance - Patient 25 - 49%     Toileting Toileting    Toileting assist Assist for toileting: Total Assistance - Patient < 25%     Transfers Chair/bed transfer  Transfers assist           Locomotion Ambulation   Ambulation assist              Walk 10 feet activity   Assist           Walk 50 feet activity   Assist           Walk 150 feet activity   Assist           Walk 10 feet on uneven surface  activity   Assist  Wheelchair     Assist               Wheelchair 50 feet with 2 turns activity    Assist            Wheelchair 150 feet activity     Assist          Blood pressure (!) 149/61, pulse 68, temperature 97.7 F (36.5 C), temperature source Oral, resp. rate 14, height 4\' 11"  (1.499 m), weight 55.5 kg, SpO2 93 %.  Medical Problem List and Plan: 1. 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  Antithrombotics: -DVT/anticoagulation:  Pharmaceutical: Lovenox             -antiplatelet therapy: DAPT X 3 months followed by plavix alone 3. Left shoulder pain/Pain Management: Will add Sportscreme to left shoulder and tylenol at bedtime. --Tylenol prn.  4. Mood: LCSW to follow for evaluation and support.              -antipsychotic agents: N/A 5. Neuropsych: This patient is not fully capable of making decisions on his own behalf. 6. Skin/Wound  Care: Routine pressure relief measures.  7. Fluids/Electrolytes/Nutrition: Monitor I/O. Check  8. HTN: Monitor BP TID--continue 9. CKD III: Baseline SCr 1.7 to 1.8 range-->at baseline 1.7 on 2/17             --Avoid nephrotoxic meds.   10. Hyperactive bladder w/nocturia: Has incontinence has been an issue with urgency-->does not like depends. Nocturia decreased with merabegron.  --toilet patient every 4 hours while awake.   -- 11.  RA: Has had shoulder pain on and off--uses tylenol prn. Also with left hip contracture which pt states is old xray shows mild OA  12. Vitamin B 12 deficiency: B-12 341-->no on weekly supplement 13. Pre-diabetes: Hgb A1C-5.8. Monitor FBS with weekly checks. 14. Hypothyroid: Continue supplement.  15. Questionable constipation- has been 2 days- monitor closely.   16.  Left hip contracture ROM per PT, Xray unremarkable   LOS: 1 days A FACE TO FACE EVALUATION WAS PERFORMED  [name] 07/08/2021, 7:51 AM

## 2021-07-08 NOTE — Progress Notes (Signed)
Pt /family does not want scheduled Tylenol at hs. Refused Tylenol at bedtime. Pt.stated will like to take it as needed. Educated patient and family and pt still refused. No c/o pain verbalized by pt.

## 2021-07-08 NOTE — Progress Notes (Signed)
Patient ID: [name], male   DOB: June 30, 1928, 88 y.o.   MRN: [id number] Met with the patient to introduce self, review rehab process and plan of care. Information given to patient on HTN, HLD, CKD and HH diet with prediabetes in vietnamese. Patient very appreciative of information ; anticipate will need to follow up with daughter regarding medications and dietary modification recommendations. Continue to follow along to discharge to address educational needs. Collaborate with the team and patient to facilitate preparation for discharge. Margarito Liner, RN

## 2021-07-08 NOTE — Evaluation (Signed)
Speech Language Pathology Assessment and Plan ° °Patient Details  °Name: [name] °MRN: [id number] °Date of Birth: 08/12/1928 ° °SLP Diagnosis: Cognitive Impairments  °Rehab Potential: Good °ELOS: 18-21 days  ° °Today's Date: 07/08/2021 °SLP Individual Time: 1300-1400 °SLP Individual Time Calculation (min): 60 min ° °Hospital Problem: Principal Problem: °  Stroke (cerebrum) (HCC) ° °Past Medical History:  °Past Medical History:  °Diagnosis Date  ° Adenocarcinoma (HCC) 1985  ° colon s/p right hemicolectomy and chemotherapy. F/U with cancer center and Dr. Newman rotinely  ° Depression   ° Eczema   ° Epistaxis   ° f/u per ENT  ° Erectile dysfunction   ° Gastric ulcer 12/11  ° H-Pylori Tx, EGD 3-12: gastritis  ° Hay fever   ° Head injury 11/2017  ° with fall  ° Hyperlipemia   ° Hypertension   ° Hypothyroidism   ° Hypothyroid  ° Insomnia   ° Osteopenia   °  per DEXA 07-2008 (Rx fosamax)  ° Rheumatoid arthritis (HCC)   ° Stroke (HCC)   °  " balance issue"  ° °Past Surgical History:  °Past Surgical History:  °Procedure Laterality Date  ° COLON SURGERY    ° Right / Adenocarcinoma / Chemo  ° COLONOSCOPY  2005, 2008  ° Dr. Newman  ° INGUINAL HERNIA REPAIR    ° Right  ° IR ANGIO INTRA EXTRACRAN SEL COM CAROTID INNOMINATE BILAT MOD SED  12/13/2017  ° IR ANGIO VERTEBRAL SEL VERTEBRAL BILAT MOD SED  12/13/2017  ° IR PTA INTRACRANIAL  12/18/2017  ° RADIOLOGY WITH ANESTHESIA N/A 12/18/2017  ° Procedure: Angioplasty with stenting;  Surgeon: Deveshwar, Sanjeev, MD;  Location: [facility] OR;  Service: Radiology;  Laterality: N/A;  ° ° °Assessment / Plan / Recommendation °Clinical Impression [name] is a 88 year old Vietnamese male with history of HTN, colon cancer, gait disorder with frequent falls,  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 CT head negative. 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 MRI/MRA brain done  revealing  right M1 high grade stenosis, chronic occlusion of distal R-VA, chronic 50% narrowing at L-VA origin, small acute perforator infarct in right corona radiata and extensive small vessel disease with brain atrophy.Therapy initiated and patient with resultant left sided weakness, balance deficits with posterior lean, delayed processing with expressive deficits and language of confusion at times. CIR recommended due to functional decline.  ° °Patient seen for speech/language/cognitive evaluation in setting of recent CVA. Pt was accompanied by Vietnamese interpreter and daughter. Daughter endorsed baseline cognitive deficits in the areas of memory and processing. Pt was living in independent living facility with spouse at prior level and received total A for medication management, bill paying, and some physical assist for completing ADLs. Since onset of CVA, daughter reported pt with increased confusion, decreased processing, and disorientation. Pt demonstrated basic orientation general time, situation, and he acknowledged he was in a hospital even though he thought he was in a different country. Placed orientation visuals in room with written words in Vietnamese "You're in a hospital in [place], [place]" and "you had a stroke." Patient verbally read with 100% accuracy. Pt had minimal-to-no awareness of new changes s/p CVA. Pt exhibited tangential speech, language of confusion at times, and required frequent verbal redirection for sustained attention, especially with conversational exchange. Pt exhibited mild hoarseness and soft phonation which is baseline per   daughter. Patient recalled visual field of 4 objects following multiple repetitions and short-term delay with 100% accuracy and appeared to enjoy this challenge. Patient would benefit from skilled SLP intervention to maximize cognitive functioning with focus on orientation, attention, intellectual awareness, and safety awareness. Educated  patient and daughter of anticipated POC in which both patient and daughter verbalized understanding and agreement.   Skilled Therapeutic Interventions          Pt participated in non-standardized assessments of cognitive-linguistic, speech, and language function. Please see above.      SLP Assessment  Patient will need skilled [facility] during CIR admission    Recommendations  Patient destination: Assisted Living (daughter reports patient is on waitlist for assisted living and hopeful he can discharge there) Follow up Recommendations: Home Health SLP;24 hour supervision/assistance Equipment Recommended: None recommended by SLP    SLP Frequency 3 to 5 out of 7 days   SLP Duration  SLP Intensity  SLP Treatment/Interventions 18-21 days  Minumum of 1-2 x/day, 30 to 90 minutes  Cueing hierarchy;Cognitive remediation/compensation;Internal/external aids;Functional tasks;Patient/family education;Therapeutic Activities    Pain    Prior Functioning Cognitive/Linguistic Baseline: Baseline deficits Baseline deficit details: daughter reported baseline memory deficits and delayed information processing Type of Home: Independent living facility  Lives With: Spouse Available Help at Discharge: Other (Comment) (daughter expressed pt/wife are on waiting list for assisted living. May be able to support 24 hour support at discharge but unclear)  SLP Evaluation Cognition Overall Cognitive Status: Impaired/Different from baseline (baseline deficits) Arousal/Alertness: Awake/alert Orientation Level: Oriented to person;Disoriented to place;Disoriented to time;Oriented to situation Year: 2023 Month: March Day of Week: Incorrect Attention: Sustained Sustained Attention: Impaired Sustained Attention Impairment: Verbal basic;Functional basic Memory: Impaired Memory Impairment: Decreased recall of new information;Decreased short term memory Decreased Short Term Memory:  Verbal basic;Functional basic Awareness: Impaired Awareness Impairment: Intellectual impairment Problem Solving: Impaired Problem Solving Impairment: Verbal basic;Functional basic Behaviors: Perseveration Safety/Judgment: Impaired Comments: Difficulty fully assessing 2/2 language barrier, pt often tangential with speech  Comprehension Auditory Comprehension Overall Auditory Comprehension: Impaired Interfering Components: Attention;Working Field seismologist: Repetition Expression Expression Primary Mode of Expression: Verbal Verbal Expression Overall Verbal Expression: Impaired (language of confusion as described by interpreter and daughter) Written Expression Dominant Hand: Right Oral Motor Oral Motor/Sensory Function Overall Oral Motor/Sensory Function: Within functional limits Motor Speech Overall Motor Speech: Appears within functional limits for tasks assessed (daughter and interpreter denied motor speech changes) Respiration: Within functional limits Intelligibility: Intelligible (pt intelligible to daughter and interpreter)  Care Tool Care Tool Cognition Ability to hear (with hearing aid or hearing appliances if normally used Ability to hear (with hearing aid or hearing appliances if normally used): 1. Minimal difficulty - difficulty in some environments (e.g. when person speaks softly or setting is noisy)   Expression of Ideas and Wants Expression of Ideas and Wants: 3. Some difficulty - exhibits some difficulty with expressing needs and ideas (e.g, some words or finishing thoughts) or speech is not clear   Understanding Verbal and Non-Verbal Content Understanding Verbal and Non-Verbal Content: 3. Usually understands - understands most conversations, but misses some part/intent of message. Requires cues at times to understand  Memory/Recall Ability Memory/Recall Ability : That he or she is in a hospital/hospital unit   PMSV Assessment  PMSV  Trial Intelligibility: Intelligible (pt intelligible to daughter and interpreter)  Short Term Goals: Week 1: SLP Short Term Goal 1 (Week 1): Patient will be oriented x4 with min A for use of external aid  SLP Short Term Goal 2 (Week 1): Patient will sustain attention for 10 minute duration with min-to-mod A verbal redirection °SLP Short Term Goal 3 (Week 1): Patient will identify 2-3 areas of impairment to increase awareness of deficits with min-to-mod A verbal cues °SLP Short Term Goal 4 (Week 1): Patient will identify safe vs. unsafe scenarios and generate appropriate solutions with min-to-mod A verbal/visual cues ° °Refer to Care Plan for Long Term Goals ° °Recommendations for other services: None  ° °Discharge Criteria: Patient will be discharged from SLP if patient refuses treatment 3 consecutive times without medical reason, if treatment goals not met, if there is a change in medical status, if patient makes no progress towards goals or if patient is discharged from hospital. ° °The above assessment, treatment plan, treatment alternatives and goals were discussed and mutually agreed upon: by patient and by family ° ° T  °07/08/2021, 5:20 PM ° ° °

## 2021-07-08 NOTE — Evaluation (Addendum)
Occupational Therapy Assessment and Plan  Patient Details  Name: [name] MRN: [id number] Date of Birth: [birth year]  OT Diagnosis: abnormal posture, cognitive deficits, hemiplegia affecting non-dominant side, muscle weakness (generalized), and decreased activity tolerance, postural control, LUE dysmetria Rehab Potential: Rehab Potential (ACUTE ONLY): Good ELOS: 18 to 21 days   Today's Date: 07/08/2021 OT Individual Time: 3143-8887 OT Individual Time Calculation (min): 61 min     Hospital Problem: Principal Problem:   Stroke (cerebrum) [facility])   Past Medical History:  Past Medical History:  Diagnosis Date   Adenocarcinoma ([place]) 1985   colon s/p right hemicolectomy and chemotherapy. F/U with cancer center and Dr. Lucia Gaskins rotinely   Depression    Eczema    Epistaxis    f/u per ENT   Erectile dysfunction    Gastric ulcer 12/11   H-Pylori Tx, EGD 3-12: gastritis   Hay fever    Head injury 11/2017   with fall   Hyperlipemia    Hypertension    Hypothyroidism    Hypothyroid   Insomnia    Osteopenia     per DEXA 07-2008 (Rx fosamax)   Rheumatoid arthritis ([place])    Stroke ([place])     " balance issue"   Past Surgical History:  Past Surgical History:  Procedure Laterality Date   COLON SURGERY     Right / Adenocarcinoma / Chemo   COLONOSCOPY  2005, 2008   Dr. Lucia Gaskins   INGUINAL HERNIA REPAIR     Right   IR ANGIO INTRA EXTRACRAN SEL COM CAROTID INNOMINATE BILAT MOD SED  12/13/2017   IR ANGIO VERTEBRAL SEL VERTEBRAL BILAT MOD SED  12/13/2017   IR PTA INTRACRANIAL  12/18/2017   RADIOLOGY WITH ANESTHESIA N/A 12/18/2017   Procedure: Angioplasty with stenting;  Surgeon: Luanne Bras, MD;  Location: [place];  Service: Radiology;  Laterality: N/A;    Assessment & Plan Clinical Impression: Patient is a 88 y.o. year old male with history of HTN, colon cancer, gait disorder with frequent falls,  prior CVA 7/19, CKD III who was admitted on 06/30/21 after recurrent falls and reports of  weakness with left sided weakness since 02/03.  CT head negative. 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 MRI/MRA brain done revealing  right M1 high grade stenosis, chronic occlusion of distal R-VA, chronic 50% narrowing at L-VA origin, small acute perforator infarct in right corona radiata and extensive small vessel disease with brain atrophy. 2 D echo showed EF 60-65%, trivial MVR, mild AVR, no wall abnormality and trivial pericardial effusion.    Dr. Erlinda Hong felt that stroke was due to small vessel disease but questioned embolic phenomena causing new right M1 high grade stenosis and recommended 30 day event monitor after discharge to rule out A fib. ASA increased to 325 mg and plavix added with recs to continue DAPT X 3 months followed by Plavix alone.  Left shoulder films done due to pain since fall and showed degenerative changes L-AC joint with narrowing and spurring. Therapy initiated and patient with resultant left sided weakness, balance deficits with posterior lean, delayed processing with expressive deficits and language of confusion at times. CIR recommended due to functional decline.   .  Patient transferred to CIR on 07/07/2021 .    Patient currently requires total with basic self-care skills secondary to muscle weakness, decreased cardiorespiratoy endurance, impaired timing and sequencing, unbalanced muscle activation, ataxia, decreased coordination, and decreased motor planning, decreased initiation, decreased attention, decreased awareness, decreased  problem solving, decreased safety awareness, decreased memory, and delayed processing, and decreased sitting balance, decreased standing balance, decreased postural control, hemiplegia, and decreased balance strategies.  Prior to hospitalization, patient could complete BADL/IADL/mobility with modified independent to S per chart review.  Patient will benefit from skilled intervention to decrease level of assist with  basic self-care skills and increase independence with basic self-care skills prior to discharge home with care partner.  Anticipate patient will require 24 hour supervision and minimal physical assistance and follow up home health.  OT - End of Session Activity Tolerance: Tolerates 10 - 20 min activity with multiple rests Endurance Deficit: Yes OT Assessment Rehab Potential (ACUTE ONLY): Good OT Barriers to Discharge: Inaccessible home environment;Insurance for SNF coverage;Home environment access/layout OT Patient demonstrates impairments in the following area(s): Balance;Cognition;Endurance;Motor;Safety;Perception;Vision OT Basic ADL's Functional Problem(s): Grooming;Bathing;Dressing;Toileting OT Transfers Functional Problem(s): Toilet;Tub/Shower OT Additional Impairment(s): Fuctional Use of Upper Extremity OT Plan OT Intensity: Minimum of 1-2 x/day, 45 to 90 minutes OT Frequency: 5 out of 7 days OT Duration/Estimated Length of Stay: 18 to 21 days OT Treatment/Interventions: Balance/vestibular training;Disease mangement/prevention;Neuromuscular re-education;Self Care/advanced ADL retraining;Therapeutic Exercise;Wheelchair propulsion/positioning;UE/LE Strength taining/ROM;Pain management;Cognitive remediation/compensation;DME/adaptive equipment instruction;Skin care/wound managment;UE/LE Coordination activities;Splinting/orthotics;Patient/family education;Functional electrical stimulation;Community reintegration;Discharge planning;Functional mobility training;Therapeutic Activities;Visual/perceptual remediation/compensation;Psychosocial support OT Self Feeding Anticipated Outcome(s): set-up A OT Basic Self-Care Anticipated Outcome(s): min A OT Toileting Anticipated Outcome(s): CGA OT Bathroom Transfers Anticipated Outcome(s): CGA OT Recommendation Patient destination: Home Follow Up Recommendations: Home health OT Equipment Recommended: To be determined   OT  Evaluation Precautions/Restrictions  Precautions Precautions: Fall Precaution Comments: strong posterior bias Restrictions Weight Bearing Restrictions: No General Chart Reviewed: Yes Response to Previous Treatment: Not applicable Family/Caregiver Present: No  Pain Pain Assessment Pain Scale: 0-10 Pain Score: 0-No pain Home Living/Prior Functioning Home Living Living Arrangements: Spouse/significant other Available Help at Discharge: Family, Friend(s), Available 24 hours/day Type of Home: House Home Access: Stairs to enter Technical brewer of Steps: 3 Entrance Stairs-Rails: Can reach both, Left, Right Home Layout: Multi-level Alternate Level Stairs-Number of Steps: 1 flight Bathroom Shower/Tub: Multimedia programmer: Handicapped height Bathroom Accessibility: Yes Additional Comments: per chart and AC, pt [facility]. pt inconsistent with home set up, intermittently referenceing [place] environment.  Lives With: Spouse, Daughter IADL History Homemaking Responsibilities:  (pt poor historian) Occupation: Retired Type of Occupation: Nature conservation officer Prior Function Level of Independence: Requires assistive device for independence  Able to Take Stairs?:  (unable to ascertain) Vision Baseline Vision/History: 1 Wears glasses Ability to See in Adequate Light: 0 Adequate Patient Visual Report: No change from baseline;Other (comment) (reports blurriness/double vision post previous stroke but has since resolved with use of glasses) Vision Assessment?: Yes Eye Alignment: Within Functional Limits Ocular Range of Motion: Restricted on the left Alignment/Gaze Preference: Within Defined Limits Tracking/Visual Pursuits: Requires cues, head turns, or add eye shifts to track;Impaired - to be further tested in functional context;Decreased smoothness of eye movement to LEFT superior field;Decreased smoothness of eye movement to  LEFT inferior field Saccades: Additional eye shifts occurred during testing;Decreased speed of saccadic movement;Additional head turns occurred during testing Convergence: Impaired - to be further tested in functional context Visual Fields: No apparent deficits Perception  Perception: Impaired Inattention/Neglect: Does not attend to left visual field Praxis Praxis: Impaired Praxis Impairment Details: Motor planning;Ideomotor;Initiation Praxis-Other Comments: apraxia Cognition Overall Cognitive Status: Impaired/Different from baseline (baseline deficits) Arousal/Alertness: Awake/alert Orientation Level: Person Year: 2023 Month: March Day of Week: Incorrect Memory: Impaired Memory Impairment: Decreased  recall of new information;Decreased short term memory Decreased Short Term Memory: Verbal basic;Functional basic Immediate Memory Recall: Sock;Blue Memory Recall Sock: Without Cue Memory Recall Blue: Without Cue Memory Recall Bed: Not able to recall Attention: Sustained Sustained Attention: Impaired Sustained Attention Impairment: Verbal basic;Functional basic Awareness: Impaired Awareness Impairment: Intellectual impairment Problem Solving: Impaired Problem Solving Impairment: Verbal basic;Functional basic Behaviors: Perseveration Safety/Judgment: Impaired Comments: Difficulty fully assessing 2/2 language barrier, pt often tangential with speech Sensation Sensation Light Touch: Appears Intact Hot/Cold: Not tested Proprioception: Impaired Detail Stereognosis: Impaired Detail Additional Comments: mild L hemi LUE>LLE, and inattention Coordination Gross Motor Movements are Fluid and Coordinated: No Fine Motor Movements are Fluid and Coordinated: No Coordination and Movement Description: mild L  sided hemiplegia/dysmetria Finger Nose Finger Test: dysmetric on L Heel Shin Test: unable to coordinate on the L side Motor  Motor Motor: Hemiplegia;Motor apraxia Motor - Skilled  Clinical Observations: L sided hemiplegia and dysmetria  Trunk/Postural Assessment  Cervical Assessment Cervical Assessment: Exceptions to [facility] (forward head) Thoracic Assessment Thoracic Assessment: Exceptions to [facility] (rounded shoulders.) Lumbar Assessment Lumbar Assessment: Exceptions to [facility] (posterior pelvic tilt) Postural Control Postural Control: Deficits on evaluation Trunk Control: posterior bias Righting Reactions: delay posteriorly  Balance Balance Balance Assessed: Yes Static Sitting Balance Static Sitting - Balance Support: Feet supported;No upper extremity supported Static Sitting - Level of Assistance: 5: Stand by assistance;4: Min assist Dynamic Sitting Balance Dynamic Sitting - Balance Support: Feet supported Dynamic Sitting - Level of Assistance: 4: Min Insurance risk surveyor Standing - Balance Support: Bilateral upper extremity supported Static Standing - Level of Assistance: 3: Mod assist;2: Max assist Dynamic Standing Balance Dynamic Standing - Balance Support: Bilateral upper extremity supported Dynamic Standing - Level of Assistance: 3: Mod assist Extremity/Trunk Assessment RUE Assessment RUE Assessment: Within Functional Limits LUE Assessment LUE Assessment: Exceptions to [facility] General Strength Comments: 4-/5 throughout, mild dysmetria and inattention but able to incorporate into bimanual ADL  Care Tool Care Tool Self Care Eating   Eating Assist Level: Set up assist    Oral Care    Oral Care Assist Level: Supervision/Verbal cueing    Bathing   Body parts bathed by patient: Face     Assist Level: Supervision/Verbal cueing    Upper Body Dressing(including orthotics)   What is the patient wearing?: Button up shirt   Assist Level: Total Assistance - Patient < 25%    Lower Body Dressing (excluding footwear)   What is the patient wearing?: Pants Assist for lower body dressing: Total Assistance - Patient < 25%    Putting on/Taking off  footwear   What is the patient wearing?: Non-skid slipper socks Assist for footwear: Total Assistance - Patient < 25%       Care Tool Toileting Toileting activity Toileting Activity did not occur (Clothing management and hygiene only): N/A (no void or bm)       Care Tool Bed Mobility Roll left and right activity        Sit to lying activity        Lying to sitting on side of bed activity   Lying to sitting on side of bed assist level: the ability to move from lying on the back to sitting on the side of the bed with no back support.: Total Assistance - Patient < 25%     Care Tool Transfers Sit to stand transfer   Sit to stand assist level: Maximal Assistance - Patient 25 - 49%    Chair/bed transfer  Chair/bed transfer assist level: Total Assistance - Patient < 25% (squat-pivot)     Toilet transfer Toilet transfer activity did not occur: Safety/medical concerns       Care Tool Cognition  Expression of Ideas and Wants Expression of Ideas and Wants: 3. Some difficulty - exhibits some difficulty with expressing needs and ideas (e.g, some words or finishing thoughts) or speech is not clear  Understanding Verbal and Non-Verbal Content Understanding Verbal and Non-Verbal Content: 3. Usually understands - understands most conversations, but misses some part/intent of message. Requires cues at times to understand   Memory/Recall Ability Memory/Recall Ability : That he or she is in a hospital/hospital unit   Refer to Care Plan for Long Term Goals  SHORT TERM GOAL WEEK 1 OT Short Term Goal 1 (Week 1): Pt will complete standing grooming task with mod A. OT Short Term Goal 2 (Week 1): Pt will don shirt with mod A. OT Short Term Goal 3 (Week 1): Pt will complete toilet transfer with max A.  Recommendations for other services: None    Skilled Therapeutic Intervention ADL ADL Eating: Set up Where Assessed-Eating: Bed level Grooming: Supervision/safety Where Assessed-Grooming:  Sitting at sink Upper Body Bathing: Supervision/safety Where Assessed-Upper Body Bathing: Sitting at sink Lower Body Bathing: Moderate assistance Where Assessed-Lower Body Bathing: Standing at sink Upper Body Dressing: Maximal assistance Where Assessed-Upper Body Dressing: Sitting at sink Lower Body Dressing: Maximal assistance Where Assessed-Lower Body Dressing: Sitting at sink Toileting: Not assessed Toilet Transfer: Not assessed Tub/Shower Transfer: Not assessed Social research officer, government: Not assessed Mobility  Bed Mobility Bed Mobility: Supine to Sit Rolling Right: Moderate Assistance - Patient 50-74% Rolling Left: Minimal Assistance - Patient > 75% Supine to Sit: Total Assistance - Patient < 25% Sit to Supine: Moderate Assistance - Patient 50-74% Transfers Sit to Stand: Maximal Assistance - Patient 25-49% Stand to Sit: Maximal Assistance - Patient 25-49%  Session note: Pt received semi-reclined in bed with nurse case manager present, denies pain and agreeable to OT eval. In person interpreter present throughout session. Reviewed role of CIR OT, evaluation process, ADL/func mobility retraining, goals for therapy, and safety plan. Evaluation completed as documented above. Came to sitting EOB with total A to progress L body off bed and to lift trunk. Maintained static sitting balance with intermittent min A due to posterior / R lean. Total A squat-pivot to w/c on his R. Donned pants with total A, pt able to assist threading BLE, max A to stand with BUE pulling up on sink, strong posterior bias noted. Total A to don button up shirt, pt often internally distracted and tangential with conversation. Washed face and hair with S seated at sink.  Pt left tilted back in TIS with interpreter present,  with safety belt alarm engaged, call bell in reach, and all immediate needs met.   Discharge Criteria: Patient will be discharged from OT if patient refuses treatment 3 consecutive times without  medical reason, if treatment goals not met, if there is a change in medical status, if patient makes no progress towards goals or if patient is discharged from hospital.  The above assessment, treatment plan, treatment alternatives and goals were discussed and mutually agreed upon: by patient  Volanda Napoleon MS, OTR/L  07/08/2021, 12:50 PM

## 2021-07-08 NOTE — Plan of Care (Signed)
°  Problem: RH Balance Goal: LTG Patient will maintain dynamic sitting balance (PT) Description: LTG:  Patient will maintain dynamic sitting balance with assistance during mobility activities (PT) Flowsheets (Taken 07/08/2021 1701) LTG: Pt will maintain dynamic sitting balance during mobility activities with:: Supervision/Verbal cueing Goal: LTG Patient will maintain dynamic standing balance (PT) Description: LTG:  Patient will maintain dynamic standing balance with assistance during mobility activities (PT) Flowsheets (Taken 07/08/2021 1701) LTG: Pt will maintain dynamic standing balance during mobility activities with:: Minimal Assistance - Patient > 75%   Problem: Sit to Stand Goal: LTG:  Patient will perform sit to stand with assistance level (PT) Description: LTG:  Patient will perform sit to stand with assistance level (PT) Flowsheets (Taken 07/08/2021 1701) LTG: PT will perform sit to stand in preparation for functional mobility with assistance level: Minimal Assistance - Patient > 75%   Problem: RH Bed Mobility Goal: LTG Patient will perform bed mobility with assist (PT) Description: LTG: Patient will perform bed mobility with assistance, with/without cues (PT). Flowsheets (Taken 07/08/2021 1701) LTG: Pt will perform bed mobility with assistance level of: Minimal Assistance - Patient > 75%   Problem: RH Bed to Chair Transfers Goal: LTG Patient will perform bed/chair transfers w/assist (PT) Description: LTG: Patient will perform bed to chair transfers with assistance (PT). Flowsheets (Taken 07/08/2021 1701) LTG: Pt will perform Bed to Chair Transfers with assistance level: Minimal Assistance - Patient > 75%   Problem: RH Car Transfers Goal: LTG Patient will perform car transfers with assist (PT) Description: LTG: Patient will perform car transfers with assistance (PT). Flowsheets (Taken 07/08/2021 1701) LTG: Pt will perform car transfers with assist:: Minimal Assistance - Patient >  75%   Problem: RH Ambulation Goal: LTG Patient will ambulate in controlled environment (PT) Description: LTG: Patient will ambulate in a controlled environment, # of feet with assistance (PT). Flowsheets (Taken 07/08/2021 1701) LTG: Pt will ambulate in controlled environ  assist needed:: Minimal Assistance - Patient > 75% LTG: Ambulation distance in controlled environment: 184ft with LRAD Goal: LTG Patient will ambulate in home environment (PT) Description: LTG: Patient will ambulate in home environment, # of feet with assistance (PT). Flowsheets (Taken 07/08/2021 1701) LTG: Pt will ambulate in home environ  assist needed:: Minimal Assistance - Patient > 75% LTG: Ambulation distance in home environment: 57ft with LRAD   Problem: RH Wheelchair Mobility Goal: LTG Patient will propel w/c in controlled environment (PT) Description: LTG: Patient will propel wheelchair in controlled environment, # of feet with assist (PT) Flowsheets (Taken 07/08/2021 1701) LTG: Pt will propel w/c in controlled environ  assist needed:: Contact Guard/Touching assist LTG: Propel w/c distance in controlled environment: 128ft

## 2021-07-08 NOTE — Progress Notes (Signed)
Inpatient Rehabilitation Center Individual Statement of Services  Patient Name:  [name]  Date:  07/08/2021  Welcome to the [facility].  Our goal is to provide you with an individualized program based on your diagnosis and situation, designed to meet your specific needs.  With this comprehensive rehabilitation program, you will be expected to participate in at least 3 hours of rehabilitation therapies Monday-Friday, with modified therapy programming on the weekends.  Your rehabilitation program will include the following services:  Physical Therapy (PT), Occupational Therapy (OT), Speech Therapy (ST), 24 hour per day rehabilitation nursing, Therapeutic Recreaction (TR), Neuropsychology, Care Coordinator, Rehabilitation Medicine, Nutrition Services, Pharmacy Services, and Other  Weekly team conferences will be held on Wednesdays to discuss your progress.  Your Inpatient Rehabilitation Care Coordinator will talk with you frequently to get your input and to update you on team discussions.  Team conferences with you and your family in attendance may also be held.  Expected length of stay:  18-20 Days  Overall anticipated outcome:  Supervision-Min A  Depending on your progress and recovery, your program may change. Your Inpatient Rehabilitation Care Coordinator will coordinate services and will keep you informed of any changes. Your Inpatient Rehabilitation Care Coordinator's name and contact numbers are listed  below.  The following services may also be recommended but are not provided by the [facility]:   [place] will be made to provide these services after discharge if needed.  Arrangements include referral to agencies that provide these services.  Your insurance has been verified to be:  [facility] Your primary doctor is:  Carollee Herter, Kendrick Fries, DO  Pertinent information will be  shared with your doctor and your insurance company.  Inpatient Rehabilitation Care Coordinator:  Erlene Quan, [place] or [phone]  Information discussed with and copy given to patient by: [name], 07/08/2021, 11:08 AM

## 2021-07-08 NOTE — Plan of Care (Signed)
°  Problem: RH Cognition - SLP Goal: RH LTG Patient will demonstrate orientation with cues Description:  LTG:  Patient will demonstrate orientation to person/place/time/situation with cues (SLP)   Flowsheets (Taken 07/08/2021 1722) LTG Patient will demonstrate orientation to:  Person  Place  Time  Situation LTG: Patient will demonstrate orientation using cueing (SLP): Supervision   Problem: RH Problem Solving Goal: LTG Patient will demonstrate problem solving for (SLP) Description: LTG:  Patient will demonstrate problem solving for basic/complex daily situations with cues  (SLP) Flowsheets (Taken 07/08/2021 1722) LTG: Patient will demonstrate problem solving for (SLP): Basic daily situations LTG Patient will demonstrate problem solving for:  Supervision  Minimal Assistance - Patient > 75%   Problem: RH Attention Goal: LTG Patient will demonstrate this level of attention during functional activites (SLP) Description: LTG:  Patient will will demonstrate this level of attention during functional activites (SLP) Flowsheets (Taken 07/08/2021 1722) Patient will demonstrate during cognitive/linguistic activities the attention type of: Sustained Patient will demonstrate this level of attention during cognitive/linguistic activities in: Controlled LTG: Patient will demonstrate this level of attention during cognitive/linguistic activities with assistance of (SLP): Supervision   Problem: RH Awareness Goal: LTG: Patient will demonstrate awareness during functional activites type of (SLP) Description: LTG: Patient will demonstrate awareness during functional activites type of (SLP) Flowsheets (Taken 07/08/2021 1722) Patient will demonstrate during cognitive/linguistic activities awareness type of:  Intellectual  Emergent LTG: Patient will demonstrate awareness during cognitive/linguistic activities with assistance of (SLP): Supervision

## 2021-07-08 NOTE — Evaluation (Signed)
Physical Therapy Assessment and Plan  Patient Details  Name: [name] MRN: [id number] Date of Birth: Oct 11, 1928  PT Diagnosis: Abnormal posture, Abnormality of gait, Coordination disorder, Difficulty walking, Hemiplegia dominant, Impaired cognition, Muscle spasms, and Muscle weakness Rehab Potential: Good ELOS: 18-21 days   Today's Date: 07/08/2021 PT Individual Time: 1005-1102 PT Individual Time Calculation (min): 57 min    Hospital Problem: Principal Problem:   Stroke (cerebrum) [facility])   Past Medical History:  Past Medical History:  Diagnosis Date   Adenocarcinoma ([place]) 1985   colon s/p right hemicolectomy and chemotherapy. F/U with cancer center and Dr. Lucia Gaskins rotinely   Depression    Eczema    Epistaxis    f/u per ENT   Erectile dysfunction    Gastric ulcer 12/11   H-Pylori Tx, EGD 3-12: gastritis   Hay fever    Head injury 11/2017   with fall   Hyperlipemia    Hypertension    Hypothyroidism    Hypothyroid   Insomnia    Osteopenia     per DEXA 07-2008 (Rx fosamax)   Rheumatoid arthritis ([place])    Stroke ([place])     " balance issue"   Past Surgical History:  Past Surgical History:  Procedure Laterality Date   COLON SURGERY     Right / Adenocarcinoma / Chemo   COLONOSCOPY  2005, 2008   Dr. Lucia Gaskins   INGUINAL HERNIA REPAIR     Right   IR ANGIO INTRA EXTRACRAN SEL COM CAROTID INNOMINATE BILAT MOD SED  12/13/2017   IR ANGIO VERTEBRAL SEL VERTEBRAL BILAT MOD SED  12/13/2017   IR PTA INTRACRANIAL  12/18/2017   RADIOLOGY WITH ANESTHESIA N/A 12/18/2017   Procedure: Angioplasty with stenting;  Surgeon: Luanne Bras, MD;  Location: [place];  Service: Radiology;  Laterality: N/A;    Assessment & Plan Clinical Impression: Patient is a [name] is a 88 year old [name] with history of HTN, colon cancer, gait disorder with frequent falls,  prior CVA 7/19, CKD III who was admitted on 06/30/21 after recurrent falls and reports of weakness with left sided weakness since  02/03.  CT head negative. 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 MRI/MRA brain done revealing  right M1 high grade stenosis, chronic occlusion of distal R-VA, chronic 50% narrowing at L-VA origin, small acute perforator infarct in right corona radiata and extensive small vessel disease with brain atrophy. 2 D echo showed EF 60-65%, trivial MVR, mild AVR, no wall abnormality and trivial pericardial effusion.    Dr. Erlinda Hong felt that stroke was due to small vessel disease but questioned embolic phenomena causing new right M1 high grade stenosis and recommended 30 day event monitor after discharge to rule out A fib. ASA increased to 325 mg and plavix added with recs to continue DAPT X 3 months followed by Plavix alone.  Left shoulder films done due to pain since fall and showed degenerative changes L-AC joint with narrowing and spurring. Therapy initiated and patient with resultant left sided weakness, balance deficits with posterior lean, delayed processing with expressive deficits and language of confusion at times. CIR recommended due to functional decline.  Patient transferred to CIR on 07/07/2021 .   Patient currently requires max with mobility secondary to muscle weakness, muscle joint tightness, and muscle paralysis, decreased cardiorespiratoy endurance, impaired timing and sequencing, unbalanced muscle activation, motor apraxia, decreased coordination, and decreased motor planning, decreased attention to left, decreased initiation, decreased attention, decreased awareness, decreased  problem solving, decreased safety awareness, decreased memory, and delayed processing, and decreased sitting balance, decreased standing balance, decreased postural control, hemiplegia, and decreased balance strategies.  Prior to hospitalization, patient was modified independent  with mobility and lived with Spouse, Daughter in a House home.  Home access is 3Stairs to enter.  Patient will benefit  from skilled PT intervention to maximize safe functional mobility, minimize fall risk, and decrease caregiver burden for planned discharge home with 24 hour supervision.  Anticipate patient will benefit from follow up [place] at discharge.  PT - End of Session Activity Tolerance: Tolerates < 10 min activity, no significant change in vital signs Endurance Deficit: Yes PT Assessment Rehab Potential (ACUTE/IP ONLY): Good PT Barriers to Discharge: [place] home environment;Decreased caregiver support;Home environment access/layout;Insurance for SNF coverage;Behavior PT Patient demonstrates impairments in the following area(s): Balance;Behavior;Edema;Endurance;Motor;Pain;Perception;Safety;Sensory;Skin Integrity PT Transfers Functional Problem(s): Bed Mobility;Bed to Chair;Car;Furniture;Floor PT Locomotion Functional Problem(s): Ambulation;Wheelchair Mobility;Stairs PT Plan PT Intensity: Minimum of 1-2 x/day ,45 to 90 minutes PT Frequency: 5 out of 7 days PT Duration Estimated Length of Stay: 18-21 days PT Treatment/Interventions: Ambulation/gait training;Discharge planning;Psychosocial support;Therapeutic Activities;Functional mobility training;Visual/perceptual remediation/compensation;Balance/vestibular training;Disease management/prevention;Neuromuscular re-education;Skin care/wound management;Therapeutic Exercise;Wheelchair propulsion/positioning;Cognitive remediation/compensation;DME/adaptive equipment instruction;Pain management;Splinting/orthotics;Community reintegration;Patient/family education;Stair training;UE/LE Coordination activities;UE/LE Strength taining/ROM;Functional electrical stimulation PT Transfers Anticipated Outcome(s): Min assist with LRAD PT Locomotion Anticipated Outcome(s): ambulatory min assist with LRAD PT Recommendation Follow Up Recommendations: Home health PT Patient destination: Assisted Living Equipment Recommended: Rolling walker with 5" wheels;To be  determined   PT Evaluation Precautions/Restrictions Precautions Precautions: None Precaution Comments: multiple falls at home prior to admission Restrictions Weight Bearing Restrictions: No General   Vital Signs Pain Pain Assessment Pain Scale: 0-10 Pain Score: 0-No pain Pain Interference Pain Interference Pain Effect on Sleep: 2. Occasionally Pain Interference with Therapy Activities: 2. Occasionally Pain Interference with Day-to-Day Activities: 2. Occasionally Home Living/Prior Functioning Home Living Living Arrangements: Spouse/significant other Available Help at Discharge: Family;Friend(s);Available 24 hours/day Type of Home: House Home Access: Stairs to enter [employer] of Steps: 3 Entrance Stairs-Rails: Can reach both;Left;Right Home Layout: Multi-level Alternate Level Stairs-Number of Steps: 1 flight Bathroom Shower/Tub: Walk-in shower Additional Comments: per chart and AC, pt [facility]. pt inconsistent with home set up, intermittently referenceing [place] environment.  Lives With: Spouse;Daughter Prior Function Level of Independence: Requires assistive device for independence  Able to Take Stairs?:  (unable to ascertain) Vision/Perception  Vision - History Ability to See in Adequate Light: 0 Adequate Perception Perception: Impaired (mild L sided inattention) Praxis Praxis: Impaired Praxis Impairment Details: Motor planning;Ideomotor;Initiation Praxis-Other Comments: apraxia  Cognition Overall Cognitive Status: No family/caregiver present to determine baseline cognitive functioning Orientation Level: Oriented X4 Awareness: Impaired Safety/Judgment: Impaired Sensation Sensation Light Touch: Appears Intact Proprioception: Appears Intact Coordination Gross Motor Movements are Fluid and Coordinated: No Coordination and Movement Description: mild L  sided hemiplegia/dysmetria Heel Shin  Test: unable to coordinate on the L side Motor  Motor Motor: Hemiplegia;Motor apraxia Motor - Skilled Clinical Observations: L sided hemiplegia and dysmetria   Trunk/Postural Assessment  Cervical Assessment Cervical Assessment: Exceptions to [facility] (forward head) Thoracic Assessment Thoracic Assessment: Exceptions to [facility] (rounded shoulders.) Lumbar Assessment Lumbar Assessment: Exceptions to [facility] (posterior pelvic tilt) Postural Control Postural Control: Deficits on evaluation Trunk Control: posterior bias Righting Reactions: delay posteriorly  Balance Balance Balance Assessed: Yes Static Sitting Balance Static Sitting - Level of Assistance: 5: Stand by assistance Dynamic Sitting Balance Dynamic Sitting - Level of Assistance: 4: Min assist  Static Standing Balance Static Standing - Balance Support: Bilateral upper extremity supported Static Standing - Level of Assistance: 3: Mod assist;4: Min assist Dynamic Standing Balance Dynamic Standing - Balance Support: Bilateral upper extremity supported Dynamic Standing - Level of Assistance: 3: Mod assist Extremity Assessment      RLE Assessment RLE Assessment: Within Functional Limits General Strength Comments: grossly 4+/5 to 5/5 LLE Assessment LLE Assessment: Exceptions to [facility] Active Range of Motion (AROM) Comments: mild decrease in ankle DF lacking ~10deg. General Strength Comments: grossly 4/5 with delayed activation  Care Tool Care Tool Bed Mobility Roll left and right activity   Roll left and right assist level: Moderate Assistance - Patient 50 - 74%    Sit to lying activity   Sit to lying assist level: Maximal Assistance - Patient 25 - 49%    Lying to sitting on side of bed activity   Lying to sitting on side of bed assist level: the ability to move from lying on the back to sitting on the side of the bed with no back support.: Maximal Assistance - Patient 25 - 49%     Care Tool Transfers Sit to stand transfer   Sit  to stand assist level: Maximal Assistance - Patient 25 - 49%    Chair/bed transfer   Chair/bed transfer assist level: Maximal Assistance - Patient 25 - 49%     Psychologist, counselling transfer activity did not occur: Safety/medical concerns        Care Tool Locomotion Ambulation Ambulation activity did not occur: Safety/medical concerns        Walk 10 feet activity         Walk 50 feet with 2 turns activity Walk 50 feet with 2 turns activity did not occur: Safety/medical concerns      Walk 150 feet activity Walk 150 feet activity did not occur: Safety/medical concerns      Walk 10 feet on uneven surfaces activity Walk 10 feet on uneven surfaces activity did not occur: Safety/medical concerns      Stairs          Walk up/down 1 step activity Walk up/down 1 step or curb (drop down) activity did not occur: Safety/medical concerns      Walk up/down 4 steps activity Walk up/down 4 steps activity did not occur: Safety/medical concerns      Walk up/down 12 steps activity Walk up/down 12 steps activity did not occur: Safety/medical concerns      Pick up small objects from floor   Pick up small object from the floor assist level: Dependent - Patient 0%;Total Assistance - Patient < 25%    Wheelchair       Wheelchair assist level: Total Assistance - Patient < 25% Max wheelchair distance: 150  Wheel 50 feet with 2 turns activity   Assist Level: Total Assistance - Patient < 25%  Wheel 150 feet activity   Assist Level: Total Assistance - Patient < 25%    Refer to Care Plan for Long Term Goals  SHORT TERM GOAL WEEK 1 PT Short Term Goal 1 (Week 1): Pt will perform bed mobility with min assist PT Short Term Goal 2 (Week 1): Pt will transfer to [facility] with mod assist consistenty PT Short Term Goal 3 (Week 1): Pt will ambulate 38f with mod assist and LRAD  Recommendations for other services: None   Skilled Therapeutic Intervention Mobility Bed  Mobility Bed Mobility: Rolling Left;Rolling  Right;Sit to Supine;Supine to Sit Rolling Right: Moderate Assistance - Patient 50-74% Rolling Left: Minimal Assistance - Patient > 75% Supine to Sit: Maximal Assistance - Patient - Patient 25-49% Sit to Supine: Moderate Assistance - Patient 50-74% Transfers Transfers: Sit to Stand;Stand Pivot Transfers Sit to Stand: Moderate Assistance - Patient 50-74% Stand Pivot Transfers: Moderate Assistance - Patient 50 - 74%;Maximal Assistance - Patient 25 - 49% Stand Pivot Transfer Details: Visual cues/gestures for sequencing;Verbal cues for sequencing;Verbal cues for technique;Verbal cues for precautions/safety;Verbal cues for safe use of DME/AE;Manual facilitation for weight shifting;Manual facilitation for placement Transfer (Assistive device): Rolling walker Locomotion  Gait Ambulation: Yes Gait Assistance: Maximal Assistance - Patient 25-49%;Moderate Assistance - Patient 50-74% Gait Distance (Feet): 10 Feet Assistive device: Rolling walker;Parallel bars Gait Gait: Yes Gait Pattern: Impaired Gait Pattern: Step-to pattern;Decreased stance time - left;Left flexed knee in stance;Narrow base of support Stairs / Additional Locomotion Stairs: Yes Number of Stairs: 4 Height of Stairs: 3 Ramp: Moderate Assistance - Patient 50 - 74% Wheelchair Mobility Wheelchair Mobility: Yes Wheelchair Assistance: Total Assistance - Patient <25% Wheelchair Propulsion: Both upper extremities Wheelchair Parts Management: Needs assistance Distance: 150   Pt received sitting in WC and agreeable to PT. PT instructed patient in PT Evaluation and initiated treatment intervention; see above for results. PT educated patient in [place], rehab potential, rehab goals, and discharge recommendations along with recommendation for follow-up rehabilitation services. Interpreter present throughout session. Pt noted to require redirection to task and topic as he would conintually answer  orientatation and home set up questions with reference to time spent in the [place] war.   Sit<>stand in parallel bars and with RW max assist on first attempts and mod assist on subsequent attempts with max cues for initiation. Gait training in parallel bars as listed. Stair management training on 3 inch step with mod assist and max cues for step to gait pattern. No buckling noted on stairs. WC mobility with BUE up to 62f, but increasing inattention the LUE with fatigue. Pt returned to room and performed stand pivot transfer to bed with mod-max assist and RW. Sit>supine completed with mod assist at the LLE and left supine in bed with call bell in reach and all needs met.       Discharge Criteria: Patient will be discharged from PT if patient refuses treatment 3 consecutive times without medical reason, if treatment goals not met, if there is a change in medical status, if patient makes no progress towards goals or if patient is discharged from hospital.  The above assessment, treatment plan, treatment alternatives and goals were discussed and mutually agreed upon: by patient  ALorie Phenix2/17/2023, 11:10 AM

## 2021-07-09 NOTE — Progress Notes (Signed)
Occupational Therapy Session Note  Patient Details  Name: Nichols Corter MRN: [id number] Date of Birth: 05/18/1929  Today's Date: 07/09/2021 OT Individual Time: 0800-0900 OT Individual Time Calculation (min): 60 min    Short Term Goals: Week 1:  OT Short Term Goal 1 (Week 1): Pt will complete standing grooming task with mod A. OT Short Term Goal 2 (Week 1): Pt will don shirt with mod A. OT Short Term Goal 3 (Week 1): Pt will complete toilet transfer with max A.  Skilled Therapeutic Interventions/Progress Updates:    OT session focused on functional transfers, sitting balance, and anterior weight shifts. Pt received supine in bed with interpretor present intermittently during session. Completed supine>sit with max A. Pt demonstrates strong posterior lean in sitting, however able to complete anterior weight shift with visual cue and achieve static sitting balance with supervision. Completed UB self-care sitting EOB with mod-max A. Attempted sit<>stand for LB self-care, however unable to achieve due to max posterior lean. Completed peri hygiene and LB dressing at bed level with min A for rolling L<>R. Transitioned back to sitting EOB and squat pivot transfer bed>w/c with max A. Pt completed oral care at sink with setup assist. Completed anterior weight shift and visual scanning exercises via reaching for OT's hand with RUE and LUE in all 4 quadrants. At end of session, pt left sitting in w/c with safety belt donned and interpretor present.    Therapy Documentation Precautions:  Precautions Precautions: Fall Precaution Comments: strong posterior bias Restrictions Weight Bearing Restrictions: No General:   Vital Signs:  Pain:   ADL: ADL Eating: Set up Where Assessed-Eating: Bed level Grooming: Supervision/safety Where Assessed-Grooming: Sitting at sink Upper Body Bathing: Supervision/safety Where Assessed-Upper Body Bathing: Sitting at sink Lower Body Bathing: Moderate assistance Where  Assessed-Lower Body Bathing: Standing at sink Upper Body Dressing: Maximal assistance Where Assessed-Upper Body Dressing: Sitting at sink Lower Body Dressing: Maximal assistance Where Assessed-Lower Body Dressing: Sitting at sink Toileting: Not assessed Toilet Transfer: Not assessed Tub/Shower Transfer: Not assessed Gaffer Transfer: Not assessed Vision   Perception    Praxis   Balance   Exercises:   Other Treatments:     Therapy/Group: Individual Therapy  Duayne Cal 07/09/2021, 12:15 PM

## 2021-07-09 NOTE — Progress Notes (Signed)
Physical Therapy Session Note  Patient Details  Name: [name] MRN: [id number] Date of Birth: [birth year]  Today's Date: 07/09/2021 PT Individual Time: 1301-1401 PT Individual Time Calculation (min): 60 min   Short Term Goals: Week 1:  PT Short Term Goal 1 (Week 1): Pt will perform bed mobility with min assist PT Short Term Goal 2 (Week 1): Pt will transfer to [facility] with mod assist consistenty PT Short Term Goal 3 (Week 1): Pt will ambulate 24f with mod assist and LRAD  Skilled Therapeutic Interventions/Progress Updates:   Pt received sitting in WC and agreeable to PT. [facility] balance training to perform lateral reach to the R to force anterior/lateral weight shift to the R 2 x 8 to place horse shoes on basket ball rim.    Dynamic gait training forward/reverse 2 x 8 ft with BUE supported in side stepping R and L with BUE supported on parallel bars and visual feedback from mirror to improve midline orientation and increase R weight shift to allow adequate step length on the L, performed 2 x 850fbil.   Pt performed 5 time sit<>stand (5xSTS): 32 sec (>15 sec indicates increased fall risk) noted to have heavy posterior bias. Performed additional 5 with emphasis on pushing from R side armrest and increased anterior weight shift prior to push into stance.   Gait training 2x 2037fith min assist for AD management, weight shift to the R and +2 for WC follow. Noted to have significantly improved step length on the LLE, weight shift R and sustained anterior weight shift.   Kinetron reciprocal movement training BLE with cues for increased hip flexion with extension on the LLE. 3 x 1 min with 1 min rest break between bouts.   Patient returned to room and left sitting in [facility] call bell in reach and all needs met.          Therapy Documentation Precautions:  Precautions Precautions: Fall Precaution Comments: strong posterior bias Restrictions Weight  Bearing Restrictions: No  Vital Signs: Therapy Vitals Temp: 98.3 F (36.8 C) Temp Source: Oral Pulse Rate: 73 Resp: 17 BP: (!) 126/57 Patient Position (if appropriate): Sitting Oxygen Therapy SpO2: 97 % O2 Device: Room Air Pain:  denies   Therapy/Group: Individual Therapy  AusLorie Phenix18/2023, 2:12 PM

## 2021-07-09 NOTE — Progress Notes (Signed)
Speech Language Pathology Daily Session Note  Patient Details  Name: [name] MRN: [id number] Date of Birth: August 20, 1928  Today's Date: 07/09/2021 SLP Individual Time: 0905-1000 SLP Individual Time Calculation (min): 55 min  Short Term Goals: Week 1: SLP Short Term Goal 1 (Week 1): Patient will be oriented x4 with min A for use of external aid SLP Short Term Goal 2 (Week 1): Patient will sustain attention for 10 minute duration with min-to-mod A verbal redirection SLP Short Term Goal 3 (Week 1): Patient will identify 2-3 areas of impairment to increase awareness of deficits with min-to-mod A verbal cues SLP Short Term Goal 4 (Week 1): Patient will identify safe vs. unsafe scenarios and generate appropriate solutions with min-to-mod A verbal/visual cues  Skilled Therapeutic Interventions: Pt seen for skilled ST with focus on cognitive goals, upright in wheelchair with live interpretor present. Pt very pleasant throughout session without language of confusion. Pt initially stating he is in "a hospital in [place]" but does state age close to his own ("32" vs 59) and can recall he had a stroke. With cues to scan environment, pt able to read sign in [place] which increased orientation to [place], [place]. Interpretor clarifying pt often speaking as though he is in the past during session. Pt enjoys discussing life in [place], career in the [employer] and accomplishments, able to sustain conversation for ~25 minutes with appropriate breaks for interpretor services and ability to pick up where he left off in story. Nursing note from previous shift note coughing with med pass, however pt observed during AM meal of regular/thin and with med pass (whole with thin liquid) with no overt s/s aspiration or difficulty noted. Pt with adequate ability to sustain attention during meal and nursing assessment as well. Pt left in wheelchair with alarm set and interpretor present. Cont ST POC.   Pain Pain  Assessment Pain Scale: 0-10 Pain Score: 0-No pain  Therapy/Group: Individual Therapy  Dewaine Conger 07/09/2021, 3:20 PM

## 2021-07-09 NOTE — Progress Notes (Signed)
Pt noted with coughing when swallowing during med pass. Passed on to oncoming nurse to follow up with speech therapist. Pt daughter notified but stated her dad always cough on swallowing.

## 2021-07-10 NOTE — Progress Notes (Signed)
PROGRESS NOTE   Subjective/Complaints:  No issues overnite , wife at bedside , ate 100% breakfast   Objective:   No results found. Recent Labs    07/08/21 0553  WBC 9.1  HGB 12.0*  HCT 35.7*  PLT 224    Recent Labs    07/08/21 0553  NA 139  K 4.0  CL 106  CO2 24  GLUCOSE 116*  BUN 38*  CREATININE 1.70*  CALCIUM 9.3    No intake or output data in the 24 hours ending 07/10/21 0810       Physical Exam: Vital Signs Blood pressure (!) 142/60, pulse 65, temperature 97.8 F (36.6 C), resp. rate 16, height 4\' 11"  (1.499 m), weight 52.9 kg, SpO2 98 %.   General: No acute distress Mood and affect are appropriate Heart: Regular rate and rhythm no rubs murmurs or extra sounds Lungs: Clear to auscultation, breathing unlabored, no rales or wheezes Abdomen: Positive bowel sounds, soft nontender to palpation, nondistended Extremities: No clubbing, cyanosis, or edema Skin: No evidence of breakdown, no evidence of rash Neurologic: Cranial nerves II through XII intact, motor strength is 5/5 in Right and4/5 L deltoid, bicep, tricep, grip, hip flexor, knee extensors, ankle dorsiflexor and plantar flexor Sensory exam normal sensation to light touch and proprioception in bilateral upper and lower extremities Cerebellar exam normal finger to nose to finger as well as heel to shin in bilateral upper and lower extremities Musculoskeletal: reduced Left hip abd/add, IR/ER . No joint swelling   Assessment/Plan: 1. Functional deficits which require 3+ hours per day of interdisciplinary therapy in a comprehensive inpatient rehab setting. Physiatrist is providing close team supervision and 24 hour management of active medical problems listed below. Physiatrist and rehab team continue to assess barriers to discharge/monitor patient progress toward functional and medical goals  Care Tool:  Bathing    Body parts bathed by patient:  Right arm, Left arm, Chest, Abdomen, Front perineal area, Face   Body parts bathed by helper: Buttocks, Right lower leg, Left lower leg, Right upper leg, Left upper leg     Bathing assist Assist Level: Moderate Assistance - Patient 50 - 74%     Upper Body Dressing/Undressing Upper body dressing   What is the patient wearing?: Pull over shirt    Upper body assist Assist Level: Maximal Assistance - Patient 25 - 49%    Lower Body Dressing/Undressing Lower body dressing      What is the patient wearing?: Incontinence brief, Pants     Lower body assist Assist for lower body dressing: Maximal Assistance - Patient 25 - 49%     Toileting Toileting Toileting Activity did not occur (Clothing management and hygiene only): N/A (no void or bm)  Toileting assist Assist for toileting: 2 Helpers     Transfers Chair/bed transfer  Transfers assist     Chair/bed transfer assist level: 2 Helpers     Locomotion Ambulation   Ambulation assist   Ambulation activity did not occur: Safety/medical concerns          Walk 10 feet activity   Assist  Walk 10 feet activity did not occur: Safety/medical concerns  Walk 50 feet activity   Assist Walk 50 feet with 2 turns activity did not occur: Safety/medical concerns         Walk 150 feet activity   Assist Walk 150 feet activity did not occur: Safety/medical concerns         Walk 10 feet on uneven surface  activity   Assist Walk 10 feet on uneven surfaces activity did not occur: Safety/medical concerns         Wheelchair     Assist        Wheelchair assist level: Total Assistance - Patient < 25% Max wheelchair distance: 150    Wheelchair 50 feet with 2 turns activity    Assist        Assist Level: Total Assistance - Patient < 25%   Wheelchair 150 feet activity     Assist      Assist Level: Total Assistance - Patient < 25%   Blood pressure (!) 142/60, pulse 65, temperature  97.8 F (36.6 C), resp. rate 16, height 4\' 11"  (1.499 m), weight 52.9 kg, SpO2 98 %.  Medical Problem List and Plan: 1. 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  Antithrombotics: -DVT/anticoagulation:  Pharmaceutical: Lovenox             -antiplatelet therapy: DAPT X 3 months followed by plavix alone 3. Left shoulder pain/Pain Management: Will add Sportscreme to left shoulder and tylenol at bedtime. --Tylenol prn.  4. Mood: LCSW to follow for evaluation and support.              -antipsychotic agents: N/A 5. Neuropsych: This patient is not fully capable of making decisions on his own behalf. 6. Skin/Wound Care: Routine pressure relief measures.  7. Fluids/Electrolytes/Nutrition: Monitor I/O. Check  8. HTN: Monitor BP TID--continue 9. CKD III: Baseline SCr 1.7 to 1.8 range-->at baseline 1.7 on 2/17, BUN creeping up but has good intake , will recheck if BUN cont to rise would start IVF at noc              --Avoid nephrotoxic meds.   10. Hyperactive bladder w/nocturia: Has incontinence has been an issue with urgency-->does not like depends. Nocturia decreased with merabegron.  --toilet patient every 4 hours while awake.   -- 11.  RA: Has had shoulder pain on and off--uses tylenol prn. Also with left hip contracture which pt states is old xray shows mild OA  12. Vitamin B 12 deficiency: B-12 341-->no on weekly supplement 13. Pre-diabetes: Hgb A1C-5.8. Monitor FBS with weekly checks. 14. Hypothyroid: Continue supplement.  15. Questionable constipation- has been 2 days- monitor closely.   16.  Left hip contracture ROM per PT, Xray unremarkable   LOS: 3 days A FACE TO FACE EVALUATION WAS PERFORMED  Charlett Blake 07/10/2021, 8:10 AM

## 2021-07-11 LAB — CBC
HCT: 34 % — ABNORMAL LOW (ref 39.0–52.0)
Hemoglobin: 11 g/dL — ABNORMAL LOW (ref 13.0–17.0)
MCH: 29 pg (ref 26.0–34.0)
MCHC: 32.4 g/dL (ref 30.0–36.0)
MCV: 89.7 fL (ref 80.0–100.0)
Platelets: 226 10*3/uL (ref 150–400)
RBC: 3.79 MIL/uL — ABNORMAL LOW (ref 4.22–5.81)
RDW: 12.7 % (ref 11.5–15.5)
WBC: 9 10*3/uL (ref 4.0–10.5)
nRBC: 0 % (ref 0.0–0.2)

## 2021-07-11 LAB — BASIC METABOLIC PANEL
Anion gap: 10 (ref 5–15)
BUN: 25 mg/dL — ABNORMAL HIGH (ref 8–23)
CO2: 25 mmol/L (ref 22–32)
Calcium: 8.6 mg/dL — ABNORMAL LOW (ref 8.9–10.3)
Chloride: 103 mmol/L (ref 98–111)
Creatinine, Ser: 1.7 mg/dL — ABNORMAL HIGH (ref 0.61–1.24)
GFR, Estimated: 38 mL/min — ABNORMAL LOW (ref >60)
Glucose, Bld: 103 mg/dL — ABNORMAL HIGH (ref 70–99)
Potassium: 3.8 mmol/L (ref 3.5–5.1)
Sodium: 138 mmol/L (ref 135–145)

## 2021-07-11 NOTE — Progress Notes (Signed)
Occupational Therapy Session Note  Patient Details  Name: [name] MRN: [id number] Date of Birth: [birth year]  Today's Date: 07/11/2021 OT Individual Time: 0900-1003 session 1  OT Individual Time Calculation (min): 63 min  Session 2: 1304-1333 (29 mins)   Short Term Goals: Week 1:  OT Short Term Goal 1 (Week 1): Pt will complete standing grooming task with mod A. OT Short Term Goal 2 (Week 1): Pt will don shirt with mod A. OT Short Term Goal 3 (Week 1): Pt will complete toilet transfer with max A.  Skilled Therapeutic Interventions/Progress Updates:  Session 1: Pt greeted supine in bed  agreeable to OT intervention. Pt handed off from PT session with PT reporting pt has greatly improved and was able to ambulate to bathroom! Session focus on BADL reeducation, functional mobility, sit<>stands and decreasing overall caregiver burden. Pt completed supine>sit with MOD A. Pt needed CGA to MOD A at times for static sitting balance EOB d/t posterior lean. Pt completed UB dressing from EOB with MOD A to don button up shirt and MAX A to don pants needing assist to thread pants and pull pants up to waist line in standing. Pt completed stand pivot transfer from EOB>w/c with MODA. As pt needs MOD A to stand d/t posterior lean and MOD A to pivot to w/c needing assist to manage RW and facilitate steps with LLE. Pt transported to gym with total A where pt worked on sit<>stands from w/c with Rw and MOD A but fading to mIN A with use of mirror for visual feedback to facilitate anterior weight shift. Pt transported back to room with total A where pt left up in w/c with alarm belt activated and all needs within reach.                              Session 2: Pt greeted upright in bed having just finished lunch and daughter and wife present. pt     agreeable to OT intervention. Session focus on LUE strengthening and coordination.issued pt compliant cubes to work on functional grasp and release with LUE, pt completed  task with overall supervision, noted apraxia but improves with repetition. Issused pt level 1 theraputty to work on grip strength and coordination. Issued pt written HEP to increase carryover. pt left supine in bed with all needs within reach and family present.                    Therapy Documentation Precautions:  Precautions Precautions: Fall Precaution Comments: strong posterior bias Restrictions Weight Bearing Restrictions: No  Pain: session 1: no pain reported during session  Session 2: no pain reported during session     Therapy/Group: Individual Therapy  Precious Haws 07/11/2021, 12:13 PM

## 2021-07-11 NOTE — Progress Notes (Signed)
Speech Language Pathology Daily Session Note  Patient Details  Name: [name] MRN: [id number] Date of Birth: 11/24/1928  Today's Date: 07/11/2021 SLP Individual Time: 1330-1400 SLP Individual Time Calculation (min): 30 min  Short Term Goals: Week 1: SLP Short Term Goal 1 (Week 1): Patient will be oriented x4 with min A for use of external aid SLP Short Term Goal 2 (Week 1): Patient will sustain attention for 10 minute duration with min-to-mod A verbal redirection SLP Short Term Goal 3 (Week 1): Patient will identify 2-3 areas of impairment to increase awareness of deficits with min-to-mod A verbal cues SLP Short Term Goal 4 (Week 1): Patient will identify safe vs. unsafe scenarios and generate appropriate solutions with min-to-mod A verbal/visual cues  Skilled Therapeutic Interventions: Skilled ST treatment focused on cognitive goals. Pt initially seen with stratus interpreter, however daughter offered to provide translation due to technical difficulties with stratus. Patient was oriented to person, place, and situation with sup A cues for use of external aids. Patient's daughter reported pt continues to exhibit disorientation to situation and place but improving with use of external aids in room. Patient sustained attention for duration of session with sup A verbal redirection. Patient exhibited increased awareness of physical impairments following CVA including both lower and upper extremity weakness, and decreased strength in left hand. Daughter reported increased vocal "raspiness" and inquired about mild voice changes. Daughter endorsed soft phonation and hoarseness at baseline. SLP educated on vocal hygiene including importance of hydration, reducing vocal strain, engaging in conversation in quiet environments, and voice rest as needed. Also educated on breath support techniques to increase breath support for speech and overall vocal intensity. Pt completed diaphragmatic breathing with min A  verbal and tactile cues. Without emphasis on diaphragm breathing, patient produced sustained /ah/ for average of 3 seconds. Pt obtained average 6 second phonation with diaphragm breath. Patient may benefit from follow up ENT to further assess if voice does not improve following efforts to enhance vocal hygiene. Patient was left in bed with alarm activated and immediate needs within reach at end of session. Continue per current plan of care.       Pain Pain Assessment Pain Scale: 0-10 Pain Score: 0-No pain  Therapy/Group: Individual Therapy  Patty Sermons 07/11/2021, 3:48 PM

## 2021-07-11 NOTE — Progress Notes (Signed)
PROGRESS NOTE   Subjective/Complaints: No new complaints this morning [name] notes he did great with therapy today Denies pain SBP slightly elevated, DBP soft  ROS: denies pain  Objective:   No results found. Recent Labs    07/11/21 0551  WBC 9.0  HGB 11.0*  HCT 34.0*  PLT 226   Recent Labs    07/11/21 0551  NA 138  K 3.8  CL 103  CO2 25  GLUCOSE 103*  BUN 25*  CREATININE 1.70*  CALCIUM 8.6*    Intake/Output Summary (Last 24 hours) at 07/11/2021 1339 Last data filed at 07/11/2021 8016 Gross per 24 hour  Intake 480 ml  Output 425 ml  Net 55 ml        Physical Exam: Vital Signs Blood pressure (!) 136/49, pulse 64, temperature 97.7 F (36.5 C), resp. rate 18, height 4\' 11"  (1.499 m), weight 55.3 kg, SpO2 97 %. Gen: no distress, normal appearing HEENT: oral mucosa pink and moist, NCAT Cardio: Reg rate Chest: normal effort, normal rate of breathing Abd: soft, non-distended Ext: no edema Psych: pleasant, normal affect Skin: No evidence of breakdown, no evidence of rash Neurologic: Cranial nerves II through XII intact, motor strength is 5/5 in Right and4/5 L deltoid, bicep, tricep, grip, hip flexor, knee extensors, ankle dorsiflexor and plantar flexor Sensory exam normal sensation to light touch and proprioception in bilateral upper and lower extremities Cerebellar exam normal finger to nose to finger as well as heel to shin in bilateral upper and lower extremities Musculoskeletal: reduced Left hip abd/add, IR/ER . No joint swelling   Assessment/Plan: 1. Functional deficits which require 3+ hours per day of interdisciplinary therapy in a comprehensive inpatient rehab setting. Physiatrist is providing close team supervision and 24 hour management of active medical problems listed below. Physiatrist and rehab team continue to assess barriers to discharge/monitor patient progress toward functional and  medical goals  Care Tool:  Bathing    Body parts bathed by patient: Right arm, Left arm, Chest, Abdomen, Front perineal area, Face   Body parts bathed by helper: Buttocks, Right lower leg, Left lower leg, Right upper leg, Left upper leg     Bathing assist Assist Level: Moderate Assistance - Patient 50 - 74%     Upper Body Dressing/Undressing Upper body dressing   What is the patient wearing?: Button up shirt    Upper body assist Assist Level: Moderate Assistance - Patient 50 - 74%    Lower Body Dressing/Undressing Lower body dressing      What is the patient wearing?: Incontinence brief, Pants     Lower body assist Assist for lower body dressing: Maximal Assistance - Patient 25 - 49%     Toileting Toileting Toileting Activity did not occur (Clothing management and hygiene only): N/A (no void or bm)  Toileting assist Assist for toileting: 2 Helpers     Transfers Chair/bed transfer  Transfers assist     Chair/bed transfer assist level: Moderate Assistance - Patient 50 - 74%     Locomotion Ambulation   Ambulation assist   Ambulation activity did not occur: Safety/medical concerns          Walk 10  feet activity   Assist  Walk 10 feet activity did not occur: Safety/medical concerns        Walk 50 feet activity   Assist Walk 50 feet with 2 turns activity did not occur: Safety/medical concerns         Walk 150 feet activity   Assist Walk 150 feet activity did not occur: Safety/medical concerns         Walk 10 feet on uneven surface  activity   Assist Walk 10 feet on uneven surfaces activity did not occur: Safety/medical concerns         Wheelchair     Assist Is the patient using a wheelchair?: Yes Type of Wheelchair: Manual    Wheelchair assist level: Total Assistance - Patient < 25% Max wheelchair distance: 150    Wheelchair 50 feet with 2 turns activity    Assist        Assist Level: Total Assistance -  Patient < 25%   Wheelchair 150 feet activity     Assist      Assist Level: Total Assistance - Patient < 25%   Blood pressure (!) 136/49, pulse 64, temperature 97.7 F (36.5 C), resp. rate 18, height 4\' 11"  (1.499 m), weight 55.3 kg, SpO2 97 %.  Medical Problem List and Plan: 1. 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  Antithrombotics: -DVT/anticoagulation:  Pharmaceutical: Lovenox             -antiplatelet therapy: DAPT X 3 months followed by plavix alone 3. Left shoulder pain/Pain Management: Continue Sportscreme to left shoulder and tylenol at bedtime. --Tylenol prn.  4. Mood: LCSW to follow for evaluation and support.              -antipsychotic agents: N/A 5. Neuropsych: This patient is not fully capable of making decisions on his own behalf. 6. Skin/Wound Care: Routine pressure relief measures.  7. Fluids/Electrolytes/Nutrition: Monitor I/O. Check  8. HTN: Monitor BP TID--continue  9. CKD III: Baseline SCr 1.7 to 1.8 range-->at baseline 1.7 on 2/17, BUN creeping up but has good intake , will recheck if BUN cont to rise would start IVF at noc              --Avoid nephrotoxic meds.   10. Hyperactive bladder w/nocturia: Has incontinence has been an issue with urgency-->does not like depends. Nocturia decreased with merabegron.  --toilet patient every 4 hours while awake.   11.  RA: Has had shoulder pain on and off--uses tylenol prn. Also with left hip contracture which pt states is old xray shows mild OA  12. Vitamin B 12 deficiency: B-12 341-->no on weekly supplement 13. Pre-diabetes: Hgb A1C-5.8. Monitor FBS with weekly checks. 14. Hypothyroid: Continue supplement.  15. Questionable constipation- has been 2 days- monitor closely.   16.  Left hip contracture ROM per PT, Xray unremarkable   LOS: 4 days A FACE  TO FACE EVALUATION WAS PERFORMED  [name] [name] 07/11/2021, 1:39 PM

## 2021-07-11 NOTE — Progress Notes (Addendum)
Physical Therapy Session Note  Patient Details  Name: [name] MRN: [id number] Date of Birth: April 16, 1929  Today's Date: 07/11/2021 PT Individual Time: 0820-0859 PT Individual Time Calculation (min): 39 min  and Today's Date: 07/11/2021 PT Missed Time: 21 Minutes Missed Time Reason: Other (Comment) (Pt eating breakfast)  Short Term Goals: Week 1:  PT Short Term Goal 1 (Week 1): Pt will perform bed mobility with min assist PT Short Term Goal 2 (Week 1): Pt will transfer to [facility] with mod assist consistenty PT Short Term Goal 3 (Week 1): Pt will ambulate 37ft with mod assist and LRAD  Skilled Therapeutic Interventions/Progress Updates:  Patient in supported long sitting position  in bed on entrance to room. Completing breakfast under NT supervision. Patient alert and agreeable to PT session after completing breakfast. Pt missed 21 min of skilled therapy due to pt's desire to complete breakfast prior to session. Will re-attempt as schedule and pt availability permits.  On return, pt completing applesauce and therapist providing required supervision. One instance of coughing to clear food from airway. Pt assisted to more upright seated position to assist with effective cough and food clearance. Pt able to clear airway and takes a few sips of drink to assist with clearing throat. No in person  interpreter present and pt declining [employer] interpreting system.   Patient with no pain complaint throughout session. Relates that he is feeling much better than over the weekend. Slept "okay".   Therapeutic Activity: Bed Mobility: Patient performed supine --> sit with repeated vc for initiation but then able to correctly initiate with no physical assist or cueing. Requires Min/ ModA to bring BLE off EOB and Mod HHA with LUE and RUE on bedrail to pull self upright to seated position. Requires time to find upright balance and overcome posterior bias but does so mostly on own. Initiates forward scoot to bring  feet to floor. Requires light MinA and extra time to complete.   At end of session, pt is able to initiate sit-->supine requiring Min/ ModA to complete bringing BLE to bed surface and for finding neutral positioning in bed.  Transfers: Patient performed sit<>stand transfers with MinA/ CGA for increasing forward lean to prevent posterior bias during rise to stand. Stand pivot transfers throughout session with CGA. Provided vc throughout session for increasing step height, correcting positioning, walker mgmt, L attn and technique.   Gait Training:  Patient ambulated 10' x2 using RW with CGA and intermittent assist into increased weight shift during stance phase to RLE for improved step length with LLE. Demonstrated very slow pace with decreased step length/ height on LLE. Provided vc/ tc for increasing step height and length as well as upright posture, and LUE management of RW.  Patient supine  in bed at end of session with brakes locked, no alarm set as pt handed off to OT for next session. Pt oriented to OT. PT updated OT on improved LOA required this session from previous therapy notes.      Therapy Documentation Precautions:  Precautions Precautions: Fall Precaution Comments: strong posterior bias Restrictions Weight Bearing Restrictions: No General:   Vital Signs: Therapy Vitals Temp: 97.7 F (36.5 C) Pulse Rate: 64 Resp: 18 BP: (!) 136/49 Patient Position (if appropriate): Lying Oxygen Therapy SpO2: 97 % O2 Device: Room Air Pain:  No pain complaint this session.   Therapy/Group: Individual Therapy  Alger Simons PT, DPT 07/11/2021, 7:45 AM

## 2021-07-12 NOTE — Progress Notes (Signed)
Speech Language Pathology Daily Session Note  Patient Details  Name: [name] MRN: [id number] Date of Birth: [birth year]  Today's Date: 07/12/2021 SLP Individual Time: 1334-1430 SLP Individual Time Calculation (min): 56 min  Short Term Goals: Week 1: SLP Short Term Goal 1 (Week 1): Patient will be oriented x4 with min A for use of external aid SLP Short Term Goal 2 (Week 1): Patient will sustain attention for 10 minute duration with min-to-mod A verbal redirection SLP Short Term Goal 3 (Week 1): Patient will identify 2-3 areas of impairment to increase awareness of deficits with min-to-mod A verbal cues SLP Short Term Goal 4 (Week 1): Patient will identify safe vs. unsafe scenarios and generate appropriate solutions with min-to-mod A verbal/visual cues  Skilled Therapeutic Interventions: Skilled ST treatment focused on cognitive goals. Patient seen with live interpreter. Patient was oriented x4 and utilized external aid written in pt's primary language of [place] for confirmation of current location and situation. SLP facilitated 3-4 picture card sequencing with ADLs with min A verbal/visual cues. SLP also facilitated sustained attention, problem solving/reasoning, and working memory with novel card game "Juel Burrow," in which patient completed for 20 minute duration with sup A redirection for attention to task, and sup A cues for problem solving and recall of task instructions. Pt exhibited good carry over throughout task. When not engaging in structured tasks, pt was known to exhibit tangential speech and required more frequent redirection. However, when expectations for session were discussed, pt attended to tasks well and required minimal redirection. Interpreter expressed patient was known to occasionally communicate both in [place] and [place] within the same sentence with minimal awareness but this appeared to occur infrequently. Interpreter reported he had worked with patient last week and  patient's ability to communicate his thoughts and overall clarity appears improved based on his observations. Patient was left in wheelchair with alarm activated and immediate needs within reach at end of session. Continue per current plan of care.       Pain Pain Assessment Pain Scale: 0-10 Pain Score: 0-No pain  Therapy/Group: Individual Therapy  Patty Sermons 07/12/2021, 3:35 PM

## 2021-07-12 NOTE — Progress Notes (Signed)
Occupational Therapy Session Note ° °Patient Details  °Name: [name] °MRN: [id number] °Date of Birth: 03/23/1929 ° °Today's Date: 07/12/2021 °OT Individual Time: 0801-0906 °OT Individual Time Calculation (min): 65 min  ° ° °Short Term Goals: °Week 1:  OT Short Term Goal 1 (Week 1): Pt will complete standing grooming task with mod A. °OT Short Term Goal 2 (Week 1): Pt will don shirt with mod A. °OT Short Term Goal 3 (Week 1): Pt will complete toilet transfer with max A. ° °Skilled Therapeutic Interventions/Progress Updates:  °   °Pt received semi-reclined in bed with LPN present, no s/sx pain, agreeable to therapy. Session focus on self-care retraining, activity tolerance, transfer retraining in prep for improved ADL/IADL/func mobility performance + decreased caregiver burden. Came to sitting EOB with max A to pivot BLE and to lift trunk. Maintained static sitting balance EOB with consistent min A due to posterior bias. In person interpreter present throughout session and MD in/out morning rounds.  ° °Stedy transfer > toilet with heavy max A to facilitate upright posture to place stedy seats down. Total A for toileting tasks post continent void of bladder.  ° °Pt bathed UB seated at sink with close S for thoroughness, able to bathe BLE with close S and cues for initiation, as well. Pt often internally distracted with conversation, often reverting into [place]. Donned button up shirt with max A to thread BUE and to button. Donned pants with max A to pull up around B hips and assist with threading. Maintain static standing at sink with min A to prevent posterior LOB while pt completed standing oral care. Max A to don B shoes. ° °Stand-pivot x2 from TIS <> bed with mod A to power up and facilitate forward trunk flexion and min A to complete transfer. ° °Pt left seated in TIS with daughter/interpreter present, pass off to PT with safety belt alarm engaged, call bell in reach, and all immediate needs met.  ° °Therapy  Documentation °Precautions:  °Precautions °Precautions: Fall °Precaution Comments: strong posterior bias °Restrictions °Weight Bearing Restrictions: No ° °Pain: ° No s/sx throughout session °ADL: See Care Tool for more details. ° ° °Therapy/Group: Individual Therapy ° ° A  MS, OTR/L ° °07/12/2021, 6:55 AM °

## 2021-07-12 NOTE — Progress Notes (Signed)
Physical Therapy Session Note  Patient Details  Name: [name] MRN: [id number] Date of Birth: 06/15/1928  Today's Date: 07/12/2021 PT Individual Time: 0901-1005 PT Individual Time Calculation (min): 64 min   Short Term Goals: Week 1:  PT Short Term Goal 1 (Week 1): Pt will perform bed mobility with min assist PT Short Term Goal 2 (Week 1): Pt will transfer to [facility] with mod assist consistenty PT Short Term Goal 3 (Week 1): Pt will ambulate 73ft with mod assist and LRAD  Skilled Therapeutic Interventions/Progress Updates:  Patient seated upright in TIS w/c on entrance to room. Patient alert and agreeable to PT session. Dtr and in-person interpreter present. In-person interpreter available most of session until needing to leave for an emergency.   Patient with no pain complaint throughout session.  Therapeutic Activity: Bed Mobility: Patient performed sit-->supine at end of session leaning back to bring BLE up into bed rather than lying to side first. Requires ModA to correct positioning with vc for technique including bridging. Instructed dtr that bridging with pt is one exercise she can do with him while in room between therapies if he is in bed. Transfers: Prior to NDT and NMR training, pt required Mod/ MaxA to perform sit<>stand d/t heavy posterior push. Following training, pt improves to CGA/ light MinA for forward lean. Stand pivot transfers require vc/ tc for attn to positioning and reaching back for seat prior to descent to sit, producing forward lean to initiate descent to sit.   Neuromuscular Re-ed: NMR facilitated during session with focus onsit<>stand training. Pt does not respond initially to NDT cueing at ribcage or with overbody assist into forward lean. Upon standing, pt continues to push heavily into posterior requiring MaxA to maintain upright stance/ balance. Armchair placed in front of pt and hands placed on seat with instructions to leave hands on seat and rise to modified  quadriped. Pt does well to stand and balance with improved anterior weight shift. Repeated several times with progression of hand placement while in quadriped from seat to armrests to no UE support or R hand support from therapist.   Pt with one episode of reverting back to [place] War times and speaking [place]. During this episode, pt very animated. To capitalize on animation and cognitive state, pt asked to stand and is able to slowly rise to upright stance with no AD and good balance.   Pt back to speaking English and progressed to standing to RW with push from w/c armrests and able to perform STS with CGA. Throughout ambulation bout, pt able to sit<>stand to RW with CGA. NMR performed for improvements in motor control and coordination, balance, sequencing, judgement, and self confidence/ efficacy in performing all aspects of mobility at highest level of independence.   Gait Training:  Patient ambulated 220' x1/ 125' x1 using RW with CGA. Demonstrated flexed trunk and mild path deviation to L with R gaze preference. Provided vc/ tc for increased L attention and increased push with LUE to maintain straighter path, increased L step height/ length with focus on heel strike throughout. L step height decreases with wane in attention as well as fatigue.   Dtr shown seated marches, LAQs and straight leg abd/ add that she can do with pt. Also educated in supine bridging and clamshells with focus on LLE participation and pt focus.   Patient supine  in bed at end of session with brakes locked, bed alarm set, and all needs within reach.     Therapy Documentation  Precautions:  Precautions Precautions: Fall Precaution Comments: strong posterior bias Restrictions Weight Bearing Restrictions: No General:   Pain: Pain Assessment Pain Scale: 0-10 Pain Score: 0-No pain  Therapy/Group: Individual Therapy  Alger Simons PT, DPT 07/12/2021, 10:27 AM

## 2021-07-12 NOTE — Progress Notes (Signed)
Pt. Is confused and agitated. He wants to go back to his old room on 3rd floor and get his clothes. I myself and the interpreter both tried to explain to him that all his belongings are in his new room. He insists we still bring him to his room and he no longer wishes to speak with the interpreter. Nurse will continue to monitor.

## 2021-07-12 NOTE — Progress Notes (Signed)
Patient ID: [name], male   DOB: May 27, 1928, 88 y.o.   MRN: [id number]  Sw received phone call from patient daughter, [name]. Daughter has to work tomorrow and will not be present. Sw will provide team conference updates on Thursday. 2/23

## 2021-07-12 NOTE — Progress Notes (Signed)
PROGRESS NOTE   Subjective/Complaints:  No issues overnite  ROS: denies pain  Objective:   No results found. Recent Labs    07/11/21 0551  WBC 9.0  HGB 11.0*  HCT 34.0*  PLT 226    Recent Labs    07/11/21 0551  NA 138  K 3.8  CL 103  CO2 25  GLUCOSE 103*  BUN 25*  CREATININE 1.70*  CALCIUM 8.6*     Intake/Output Summary (Last 24 hours) at 07/12/2021 0802 Last data filed at 07/11/2021 1601 Gross per 24 hour  Intake 240 ml  Output 400 ml  Net -160 ml         Physical Exam: Vital Signs Blood pressure (!) 160/68, pulse 66, temperature 97.9 F (36.6 C), temperature source Oral, resp. rate 14, height 4\' 11"  (1.499 m), weight 52.5 kg, SpO2 100 %.  General: No acute distress Mood and affect are appropriate Heart: Regular rate and rhythm no rubs murmurs or extra sounds Lungs: Clear to auscultation, breathing unlabored, no rales or wheezes Abdomen: Positive bowel sounds, soft nontender to palpation, nondistended Extremities: No clubbing, cyanosis, or edema Skin: No evidence of breakdown, no evidence of rash  Neurologic: Cranial nerves II through XII intact, motor strength is 5/5 in Right and4/5 L deltoid, bicep, tricep, grip, hip flexor, knee extensors, ankle dorsiflexor and plantar flexor Sensory exam normal sensation to light touch and proprioception in bilateral upper and lower extremities Cerebellar exam normal finger to nose to finger as well as heel to shin in bilateral upper and lower extremities Musculoskeletal: reduced Left hip abd/add, IR/ER . No joint swelling   Assessment/Plan: 1. Functional deficits which require 3+ hours per day of interdisciplinary therapy in a comprehensive inpatient rehab setting. Physiatrist is providing close team supervision and 24 hour management of active medical problems listed below. Physiatrist and rehab team continue to assess barriers to discharge/monitor  patient progress toward functional and medical goals  Care Tool:  Bathing    Body parts bathed by patient: Right arm, Left arm, Chest, Abdomen, Front perineal area, Face   Body parts bathed by helper: Buttocks, Right lower leg, Left lower leg, Right upper leg, Left upper leg     Bathing assist Assist Level: Moderate Assistance - Patient 50 - 74%     Upper Body Dressing/Undressing Upper body dressing   What is the patient wearing?: Button up shirt    Upper body assist Assist Level: Moderate Assistance - Patient 50 - 74%    Lower Body Dressing/Undressing Lower body dressing      What is the patient wearing?: Incontinence brief, Pants     Lower body assist Assist for lower body dressing: Maximal Assistance - Patient 25 - 49%     Toileting Toileting Toileting Activity did not occur (Clothing management and hygiene only): N/A (no void or bm)  Toileting assist Assist for toileting: 2 Helpers     Transfers Chair/bed transfer  Transfers assist     Chair/bed transfer assist level: Minimal Assistance - Patient > 75%     Locomotion Ambulation   Ambulation assist   Ambulation activity did not occur: Safety/medical concerns  Assist level: Contact Guard/Touching assist Assistive device:  Walker-rolling     Walk 10 feet activity   Assist  Walk 10 feet activity did not occur: Safety/medical concerns  Assist level: Contact Guard/Touching assist Assistive device: Walker-rolling   Walk 50 feet activity   Assist Walk 50 feet with 2 turns activity did not occur: Safety/medical concerns         Walk 150 feet activity   Assist Walk 150 feet activity did not occur: Safety/medical concerns         Walk 10 feet on uneven surface  activity   Assist Walk 10 feet on uneven surfaces activity did not occur: Safety/medical concerns         Wheelchair     Assist Is the patient using a wheelchair?: Yes Type of Wheelchair: Manual    Wheelchair assist  level: Total Assistance - Patient < 25% Max wheelchair distance: 150    Wheelchair 50 feet with 2 turns activity    Assist        Assist Level: Total Assistance - Patient < 25%   Wheelchair 150 feet activity     Assist      Assist Level: Total Assistance - Patient < 25%   Blood pressure (!) 160/68, pulse 66, temperature 97.9 F (36.6 C), temperature source Oral, resp. rate 14, height 4\' 11"  (1.499 m), weight 52.5 kg, SpO2 100 %.  Medical Problem List and Plan: 1. 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  Antithrombotics: -DVT/anticoagulation:  Pharmaceutical: Lovenox             -antiplatelet therapy: DAPT X 3 months followed by plavix alone 3. Left shoulder pain/Pain Management: Continue Sportscreme to left shoulder and tylenol at bedtime. --Tylenol prn.  4. Mood: LCSW to follow for evaluation and support.              -antipsychotic agents: N/A 5. Neuropsych: This patient is not fully capable of making decisions on his own behalf. 6. Skin/Wound Care: Routine pressure relief measures.  7. Fluids/Electrolytes/Nutrition: Monitor I/O. Check  8. HTN: Monitor BP TID--continue  9. CKD III: Baseline SCr 1.7 to 1.8 range-->at baseline 1.7 on 2/17, BUN creeping up but has good intake , will recheck if BUN cont to rise would start IVF at noc              --Avoid nephrotoxic meds.   10. Hyperactive bladder w/nocturia: Has incontinence has been an issue with urgency-->does not like depends. Nocturia decreased with merabegron.  --toilet patient every 4 hours while awake.   11.  RA: Has had shoulder pain on and off--uses tylenol prn. Also with left hip contracture which pt states is old xray shows mild OA  12. Vitamin B 12 deficiency: B-12 341-->no on weekly supplement 13. Pre-diabetes: Hgb A1C-5.8. Monitor FBS with  weekly checks. 14. Hypothyroid: Continue supplement.  15. Questionable constipation- has been 2 days- monitor closely.   16.  Left hip contracture ROM per PT, Xray unremarkable   LOS: 5 days A FACE TO FACE EVALUATION WAS PERFORMED  Charlett Blake 07/12/2021, 8:02 AM

## 2021-07-13 NOTE — Progress Notes (Signed)
Occupational Therapy Session Note  Patient Details  Name: [name] MRN: [id number] Date of Birth: 1928-08-14  Today's Date: 07/13/2021 OT Individual Time: 7915-0569 OT Individual Time Calculation (min): 53 min  + 29 min   Short Term Goals: Week 1:  OT Short Term Goal 1 (Week 1): Pt will complete standing grooming task with mod A. OT Short Term Goal 2 (Week 1): Pt will don shirt with mod A. OT Short Term Goal 3 (Week 1): Pt will complete toilet transfer with max A.  Skilled Therapeutic Interventions/Progress Updates:    Session 1 [phone]): Pt received semi-reclined in bed, no c/o pain, agreeable to therapy. Session focus on self-care retraining, activity tolerance, transfer retraining, LUE NMR in prep for improved ADL/IADL/func mobility performance + decreased caregiver burden. Came to sitting EOB with max A to progress BLE off bed + lift trunk. Declines need to toilet but later noted to have been incontinent of bladder with poor awareness. Stand-pivot > w/c with ultimately heavy mod A due to posterior bias, required multiple attempts to come into standing and facilitate forward weight shift with poor results due to trunk rigidity. Bathed full-body at sink with min A to bathe buttocks. Stood with min A for balance to complete oral care. Donned pull over shirt with min A to adjust sleeve over L shoulder, donned brief/pants with mod A to pull up over B hips in standing. Total A to don B shoes and TIS transport to and from gym.  Completed 9HPT with the following results:  -R: 1 min 13 seconds  -L: 7 min 33 seconds  Pt additionally completed 1x15 rolling ball forward and back across K bench to target bimanual integration, anterior WS and trunk mobility.  Pt left tilted back in TIS with safety belt alarm engaged, call bell in reach, and all immediate needs met.    Session 2 [phone]): Pt received in stedy with NT en route to toilet, no c/o pain, agreeable to therapy. Session focus on  self-care retraining, activity tolerance, transfer retraining in prep for improved ADL/IADL/func mobility performance + decreased caregiver burden.  Continent void of bowel, although pt accidentally urinating on pants. Total A to thread new pants and clean BLE for time management. Min A to power-up from toilet and counteract posterior bias ,min A for thoroughness of posterior pericare. Ambulated > sink > bed with overall CGA and RW. Washed hands at sink with CGA for balance. Returned to supine with min A to scoot hips > his L.  Pt left semi-reclined in bed with bed alarm engaged, call bell in reach, and all immediate needs met.    Therapy Documentation Precautions:  Precautions Precautions: Fall Precaution Comments: strong posterior bias Restrictions Weight Bearing Restrictions: No  Pain: Pain Assessment Pain Scale: 0-10 Pain Score: 0-No pain ADL: See Care Tool for more details.   Therapy/Group: Individual Therapy  Volanda Napoleon MS, OTR/L  07/13/2021, 6:51 AM

## 2021-07-13 NOTE — Patient Care Conference (Signed)
Inpatient RehabilitationTeam Conference and Plan of Care Update Date: 07/13/2021   Time: 10:08 AM    Patient Name: [name]      Medical Record Number: [id number]  Date of Birth: [birth year] Sex: Male         Room/Bed: 4M06C/4M06C-01 Payor Info: Payor: [employer] MEDICARE / Plan: [facility] MEDICARE / Product Type: *No Product type* /    Admit Date/Time:  07/07/2021  2:48 PM  Primary Diagnosis:  Stroke (cerebrum) [facility])  Hospital Problems: Principal Problem:   Stroke (cerebrum) [facility])    Expected Discharge Date: Expected Discharge Date: 07/29/21  Team Members Present: Physician leading conference: Dr. Alysia Penna Social Worker Present: Erlene Quan, BSW Nurse Present: Dorien Chihuahua, RN PT Present: Barrie Folk, PT OT Present: Providence Lanius, OT SLP Present: Sherren Kerns, SLP PPS Coordinator present : Gunnar Fusi, SLP     Current Status/Progress Goal Weekly Team Focus  Bowel/Bladder   Incont. of B/B  Regain cont. of B/B  Assess toileting needs qshift & PRN   Swallow/Nutrition/ Hydration             ADL's   min UBD; mod to max LBD, total A toileting in stedy, mod A bathing at sink, min A oral care/grooming in stance; strong posterior bias / rigidity that improves throughout session, internally distracted due to confusion but overall much improved since eval  min A LBD, shower transfer goals; CGA toilet t/fs/toileting, CGA remaining ADL for now  LUE NMR, transfer/balance/self-care retraining, pt/family/DME/AE education   Mobility   Bed mobility = modA; Transfers = CGA/ MinA; Gait = up to 200' (with fatigue and quality breakdown) using RW and CGA with consistent vc throughout for walker mgmt and increasing L step height/ length  Bed mobility with MinA; Transfers with MinA; Ambulation with CGA/ MinA using LRAD, w/c mobility with CGA  standing balance/ anterior weight shift, L attention, L NMR, initiation, safety awareness, transfers, gait   Communication              Safety/Cognition/ Behavioral Observations  min A basic cognition  sup  orientation, basic problem solving/safety awareness, sustained attention   Pain   No c/o pain or discomfort  Remain free from pain  Assess qshift and PRN   Skin   Skin intact no impairments, IV saline lock, right forearm  Assess skin qshift and PRN        Discharge Planning:  discharging back to ILF or ALF   Team Discussion: Patient with mild hemiparesis post stroke with old left hip contracture, rigid trunk and mild OA. Progress and functional status limited by confusion, activity tolerance, internal distractions and left shoulder pain. Patient with apraxia which affects processing; can be tangential and lacks awareness of deficits.  Patient on target to meet rehab goals: yes, currently needs mi assist for upper body care and mod - max assist for lower body dressing. Requires total assist for toileting. Completes stand pivot transfers to [facility] wit mod assist. Needs mod assist for bathing. Requires mod - max assist for mobility and ambulates up to 220' with CGA. Goals for discharge set for min assist overall.   *See Care Plan and progress notes for long and short-term goals.   Revisions to Treatment Plan:  N/A  Teaching Needs: Safety, transfers, toileting, medication, secondary risk management, etc  Current Barriers to Discharge: Decreased caregiver support and Home enviroment access/layout  Possible Resolutions to Barriers: Family education with daughter 24/7 care recommended; team does not recommend return to ILF  Medical Summary Current Status: Patient had episode of confusion.  Blood pressures are still uncontrolled, intake is fair  Barriers to Discharge: Medical stability   Possible Resolutions to Barriers/Weekly Focus: May need further adjustment of blood pressure medications   Continued Need for Acute Rehabilitation Level of Care: The patient requires daily medical management by a physician  with specialized training in physical medicine and rehabilitation for the following reasons: Direction of a multidisciplinary physical rehabilitation program to maximize functional independence : Yes Medical management of patient stability for increased activity during participation in an intensive rehabilitation regime.: Yes Analysis of laboratory values and/or radiology reports with any subsequent need for medication adjustment and/or medical intervention. : Yes   I attest that I was present, lead the team conference, and concur with the assessment and plan of the team.   Dorien Chihuahua B 07/13/2021, 3:56 PM

## 2021-07-13 NOTE — Progress Notes (Signed)
Speech Language Pathology Daily Session Note  Patient Details  Name: [name] MRN: [id number] Date of Birth: Apr 28, 1929  Today's Date: 07/13/2021 SLP Individual Time: 1300-1400 SLP Individual Time Calculation (min): 60 min  Short Term Goals: Week 1: SLP Short Term Goal 1 (Week 1): Patient will be oriented x4 with min A for use of external aid SLP Short Term Goal 2 (Week 1): Patient will sustain attention for 10 minute duration with min-to-mod A verbal redirection SLP Short Term Goal 3 (Week 1): Patient will identify 2-3 areas of impairment to increase awareness of deficits with min-to-mod A verbal cues SLP Short Term Goal 4 (Week 1): Patient will identify safe vs. unsafe scenarios and generate appropriate solutions with min-to-mod A verbal/visual cues  Skilled Therapeutic Interventions: Skilled ST treatment focused on cognitive goals. Patient seen with live interpreter. SLP facilitated session by providing overall sup A for basic problem solving/reasoning and alternating attention in "[place]" card game. Pt independently recalled instructions from yesterday's session and exhibited good carry over. SLP also facilitated ID of safe vs. unsafe home scenarios via picture cards. Pt identified unsafe scenarios and generated appropriate solutions with sup A cues with effective reasoning and safety awareness. Pt required increased cueing for emergent awareness to re-frame responses/thinking to current deficits as compared to how he would react prior to CVA. Patient was left in wheelchair with alarm activated and immediate needs within reach at end of session. Continue per current plan of care.      Pain Pain Assessment Pain Scale: 0-10 Pain Score: 0-No pain  Therapy/Group: Individual Therapy  Patty Sermons 07/13/2021, 2:01 PM

## 2021-07-13 NOTE — Progress Notes (Signed)
Physical Therapy Session Note  Patient Details  Name: [name] MRN: [id number] Date of Birth: 10-20-1928  Today's Date: 07/13/2021 PT Individual Time: 1458-1536 PT Individual Time Calculation (min): 38 min   Short Term Goals: Week 1:  PT Short Term Goal 1 (Week 1): Pt will perform bed mobility with min assist PT Short Term Goal 2 (Week 1): Pt will transfer to [facility] with mod assist consistenty PT Short Term Goal 3 (Week 1): Pt will ambulate 60f with mod assist and LRAD   Skilled Therapeutic Interventions/Progress Updates:   Pt received supine in bed and agreeable to PT. Supine>sit transfer with CGA and cues for anterior weight shift with tactile cues to trunk sitting EOB. Donning shoes sitting EOB with supervision assist for management and  max Assist for clothing management.   [facility] to prevent posterior bias initially.   Dynamic balance/variable gait training in parallel bars. Step up/down 2 x 6 BLE with BUE Support. Side stepping R and L 3 x 819f forward/reverse 2 x 53f34fith cues for posture and step length BLE.  0  BITS dynamic balance training visual scanning user paced with central bias and then full screen 1 min each min assist progressing to supervision assist from PT to prevent mild posterior bias. Sit<>stand [facility]-supervision assist throughout session with UE support from PT and parallel bars.   Pt returned to room and performed stand pivot transfer to bed with Min assist . Sit>supine completed with supervision assist and increased time, and left supine in bed with call bell in reach and all needs met.       Therapy Documentation Precautions:  Precautions Precautions: Fall Precaution Comments: strong posterior bias Restrictions Weight Bearing Restrictions: No  Pain: Pain Assessment Pain Scale: 0-10 Pain Score: 0-No pain    Therapy/Group: Individual Therapy  AusLorie Phenix22/2023, 4:11 PM

## 2021-07-13 NOTE — Progress Notes (Signed)
PROGRESS NOTE   Subjective/Complaints: No issues this am , some confusion last night as per RN note   ROS: denies pain  Objective:   No results found. Recent Labs    07/11/21 0551  WBC 9.0  HGB 11.0*  HCT 34.0*  PLT 226    Recent Labs    07/11/21 0551  NA 138  K 3.8  CL 103  CO2 25  GLUCOSE 103*  BUN 25*  CREATININE 1.70*  CALCIUM 8.6*     Intake/Output Summary (Last 24 hours) at 07/13/2021 0744 Last data filed at 07/12/2021 0813 Gross per 24 hour  Intake 118 ml  Output --  Net 118 ml         Physical Exam: Vital Signs Blood pressure (!) 166/70, pulse 64, temperature 97.9 F (36.6 C), temperature source Oral, resp. rate 18, height '4\' 11"'  (1.499 m), weight 52.5 kg, SpO2 98 %.  General: No acute distress Mood and affect are appropriate Heart: Regular rate and rhythm no rubs murmurs or extra sounds Lungs: Clear to auscultation, breathing unlabored, no rales or wheezes Abdomen: Positive bowel sounds, soft nontender to palpation, nondistended Extremities: No clubbing, cyanosis, or edema Skin: No evidence of breakdown, no evidence of rash  Neurologic: Cranial nerves II through XII intact, motor strength is 5/5 in Right and4/5 L deltoid, bicep, tricep, grip, hip flexor, knee extensors, ankle dorsiflexor and plantar flexor Sensory exam normal sensation to light touch and proprioception in bilateral upper and lower extremities Cerebellar exam normal finger to nose to finger as well as heel to shin in bilateral upper and lower extremities Musculoskeletal: reduced Left hip abd/add, IR/ER . No joint swelling   Assessment/Plan: 1. Functional deficits which require 3+ hours per day of interdisciplinary therapy in a comprehensive inpatient rehab setting. Physiatrist is providing close team supervision and 24 hour management of active medical problems listed below. Physiatrist and rehab team continue to assess  barriers to discharge/monitor patient progress toward functional and medical goals  Care Tool:  Bathing    Body parts bathed by patient: Right arm, Left arm, Chest, Abdomen, Front perineal area, Face, Right upper leg, Left upper leg   Body parts bathed by helper: Buttocks, Right lower leg, Left lower leg, Right upper leg, Left upper leg     Bathing assist Assist Level: Minimal Assistance - Patient > 75%     Upper Body Dressing/Undressing Upper body dressing   What is the patient wearing?: Button up shirt    Upper body assist Assist Level: Maximal Assistance - Patient 25 - 49%    Lower Body Dressing/Undressing Lower body dressing      What is the patient wearing?: Incontinence brief, Pants     Lower body assist Assist for lower body dressing: Maximal Assistance - Patient 25 - 49%     Toileting Toileting Toileting Activity did not occur (Clothing management and hygiene only): N/A (no void or bm)  Toileting assist Assist for toileting: Dependent - Patient 0% (stedy)     Transfers Chair/bed transfer  Transfers assist     Chair/bed transfer assist level: Minimal Assistance - Patient > 75%     Locomotion Ambulation   Ambulation assist  Ambulation activity did not occur: Safety/medical concerns  Assist level: Contact Guard/Touching assist Assistive device: Walker-rolling     Walk 10 feet activity   Assist  Walk 10 feet activity did not occur: Safety/medical concerns  Assist level: Contact Guard/Touching assist Assistive device: Walker-rolling   Walk 50 feet activity   Assist Walk 50 feet with 2 turns activity did not occur: Safety/medical concerns         Walk 150 feet activity   Assist Walk 150 feet activity did not occur: Safety/medical concerns         Walk 10 feet on uneven surface  activity   Assist Walk 10 feet on uneven surfaces activity did not occur: Safety/medical concerns         Wheelchair     Assist Is the  patient using a wheelchair?: Yes Type of Wheelchair: Manual    Wheelchair assist level: Total Assistance - Patient < 25% Max wheelchair distance: 150    Wheelchair 50 feet with 2 turns activity    Assist        Assist Level: Total Assistance - Patient < 25%   Wheelchair 150 feet activity     Assist      Assist Level: Total Assistance - Patient < 25%   Blood pressure (!) 166/70, pulse 64, temperature 97.9 F (36.6 C), temperature source Oral, resp. rate 18, height '4\' 11"'  (1.499 m), weight 52.5 kg, SpO2 98 %.  Medical Problem List and Plan: 1. 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 Formulized individual treatment plan based on medical history, underlying problem and comorbidities.  2.  Antithrombotics: -DVT/anticoagulation:  Pharmaceutical: Lovenox             -antiplatelet therapy: DAPT X 3 months followed by plavix alone 3. Left shoulder pain/Pain Management: Continue Sportscreme to left shoulder and tylenol at bedtime. --Tylenol prn.  4. Mood: LCSW to follow for evaluation and support.              -antipsychotic agents: N/A 5. Neuropsych: This patient is not fully capable of making decisions on his own behalf. 6. Skin/Wound Care: Routine pressure relief measures.  7. Fluids/Electrolytes/Nutrition: Monitor I/O. Check  8. HTN: Monitor BP TID--continue  9. CKD III: Baseline SCr 1.7 to 1.8 range-->creat at baseline BUN normalizing              --Avoid nephrotoxic meds.   10. Hyperactive bladder w/nocturia: Has incontinence has been an issue with urgency-->does not like depends. Nocturia decreased with merabegron.  --toilet patient every 4 hours while awake.   11.   RA: Has had shoulder pain on and off--uses tylenol prn. Also with left hip contracture which pt states is old xray shows mild OA - no active synovitis  12. Vitamin B 12 deficiency: B-12 341-->no on weekly supplement 13. Pre-diabetes: Hgb A1C-5.8. Monitor FBS with weekly checks. 14. Hypothyroid: Continue supplement.  15. Questionable constipation- has been 2 days- monitor closely.   16.  Left hip contracture ROM per PT, Xray unremarkable   LOS: 6 days A FACE TO FACE EVALUATION WAS PERFORMED  Charlett Blake 07/13/2021,  7:44 AM

## 2021-07-13 NOTE — Progress Notes (Signed)
Physical Therapy Session Note ° °Patient Details  °Name: [name] °MRN: [id number] °Date of Birth: 07/27/1928 ° °Today's Date: 07/13/2021 °PT Individual Time: 1030-1130 °PT Individual Time Calculation (min): 60 min  ° °Short Term Goals: °Week 1:  PT Short Term Goal 1 (Week 1): Pt will perform bed mobility with min assist °PT Short Term Goal 2 (Week 1): Pt will transfer to WC with mod assist consistenty °PT Short Term Goal 3 (Week 1): Pt will ambulate 35ft with mod assist and LRAD ° °Skilled Therapeutic Interventions/Progress Updates:  ° °Pt received sitting in WC and agreeable to PT.  ° °Pt performed sit<>stand from WC x 5 and then x 10 throughout remainder of session with CGA-min assist ot facilitate anterior weight shift and to stabilize RW to improve stability through UE support on RW. Gait training with RW x 120ft with CGA to improve step length and to keep RW infornt of BOS to facilitate anterior weight shift. Weave through 8 cones x1 with cues for AD management in turns.  ° °Dynamic balance to force anterior weight shift anteriorly to perofrm forward stepping over hockey stick as well as completing forward reach to place and remove 7 clothes pins standing on redge wedge x 2 bouts. Cues for WB through forefoot with RW infornt of COM.  ° °Stair management with min assist ascent and min fading to mod assist for last 2 steps due to increasing posterior bias with BUE support on bil rails. Patient returned to room and left sitting in WC with call bell in reach and all needs met.   ° ° °   ° °Therapy Documentation °Precautions:  °Precautions °Precautions: Fall °Precaution Comments: strong posterior bias °Restrictions °Weight Bearing Restrictions: No ° °Pain: °Pain Assessment °Pain Scale: 0-10 °Pain Score: 0-No pain ° ° ° °Therapy/Group: Individual Therapy ° ° E  °07/13/2021, 11:37 AM  °

## 2021-07-14 DIAGNOSIS — I6300 Cerebral infarction due to thrombosis of unspecified precerebral artery: Secondary | ICD-10-CM

## 2021-07-14 NOTE — Progress Notes (Signed)
Physical Therapy Session Note  Patient Details  Name: [name] MRN: [id number] Date of Birth: [birth year]  Today's Date: 07/14/2021 PT Individual Time: 1009-1102 PT Individual Time Calculation (min): 53 min   Short Term Goals: Week 1:  PT Short Term Goal 1 (Week 1): Pt will perform bed mobility with min assist PT Short Term Goal 2 (Week 1): Pt will transfer to [facility] with mod assist consistenty PT Short Term Goal 3 (Week 1): Pt will ambulate 64f with mod assist and LRAD  Skilled Therapeutic Interventions/Progress Updates:    Pt received sitting in WC and agreeable to PT  [place] Gait training with RW 2 x 1252fover cement sidewalk with CGA-min assist for control of WC through turns. Additional gait training without AD x 457fith HHA on the LUE and min assist for safety. Noted to decreased step length and difficulty motorplanning turns when fatigued.   PT instructed pt in modfied [place] balance program. Each performed x 8 BLE with cues for UE support on bench back. Hip abduction, HS curl, squat, LAQ, tandem stance and sit<>stand. Min assist for safety throughout with multiple therapeutic rest breaks due to fatigue.   Sit<>stand transfers with CGA-supervision assist and cues for improved hip hinge to initiate transfers in both directions.   Side stepping at bedn back 2x 6 ft bil with cues for decreased compensation through pelvic rotation and cues for imrpvoed step width on the LLE. Patient returned to room and [facility] reach and all needs met.        Therapy Documentation Precautions:  Precautions Precautions: Fall Precaution Comments: strong posterior bias Restrictions Weight Bearing Restrictions: No  Pain: Pain Assessment Pain Scale: 0-10 Pain Score: 0-No paindenies   Therapy/Group: Individual Therapy  AusLorie Phenix23/2023, 11:13 AM

## 2021-07-14 NOTE — Progress Notes (Signed)
PROGRESS NOTE   Subjective/Complaints: " You have a nice day"  ROS: denies pain  Objective:   No results found. No results for input(s): WBC, HGB, HCT, PLT in the last 72 hours.  No results for input(s): NA, K, CL, CO2, GLUCOSE, BUN, CREATININE, CALCIUM in the last 72 hours.   Intake/Output Summary (Last 24 hours) at 07/14/2021 0750 Last data filed at 07/14/2021 0616 Gross per 24 hour  Intake 480 ml  Output 325 ml  Net 155 ml         Physical Exam: Vital Signs Blood pressure (!) 156/52, pulse 68, temperature (!) 97.5 F (36.4 C), temperature source Oral, resp. rate 18, height 4\' 11"  (1.499 m), weight 52.5 kg, SpO2 97 %.  General: No acute distress Mood and affect are appropriate Heart: Regular rate and rhythm no rubs murmurs or extra sounds Lungs: Clear to auscultation, breathing unlabored, no rales or wheezes Abdomen: Positive bowel sounds, soft nontender to palpation, nondistended Extremities: No clubbing, cyanosis, or edema Skin: No evidence of breakdown, no evidence of rash  Neurologic: Cranial nerves II through XII intact, motor strength is 5/5 in Right and4/5 L deltoid, bicep, tricep, grip, hip flexor, knee extensors, ankle dorsiflexor and plantar flexor Sensory exam normal sensation to light touch and proprioception in bilateral upper and lower extremities Cerebellar exam normal finger to nose to finger as well as heel to shin in bilateral upper and lower extremities Musculoskeletal: reduced Left hip abd/add, IR/ER . No joint swelling   Assessment/Plan: 1. Functional deficits which require 3+ hours per day of interdisciplinary therapy in a comprehensive inpatient rehab setting. Physiatrist is providing close team supervision and 24 hour management of active medical problems listed below. Physiatrist and rehab team continue to assess barriers to discharge/monitor patient progress toward functional and  medical goals  Care Tool:  Bathing    Body parts bathed by patient: Right arm, Left arm, Chest, Abdomen, Front perineal area, Face, Right upper leg, Left upper leg   Body parts bathed by helper: Buttocks, Right lower leg, Left lower leg, Right upper leg, Left upper leg     Bathing assist Assist Level: Minimal Assistance - Patient > 75%     Upper Body Dressing/Undressing Upper body dressing   What is the patient wearing?: Button up shirt    Upper body assist Assist Level: Maximal Assistance - Patient 25 - 49%    Lower Body Dressing/Undressing Lower body dressing      What is the patient wearing?: Incontinence brief, Pants     Lower body assist Assist for lower body dressing: Maximal Assistance - Patient 25 - 49%     Toileting Toileting Toileting Activity did not occur (Clothing management and hygiene only): N/A (no void or bm)  Toileting assist Assist for toileting: Moderate Assistance - Patient 50 - 74%     Transfers Chair/bed transfer  Transfers assist     Chair/bed transfer assist level: Minimal Assistance - Patient > 75%     Locomotion Ambulation   Ambulation assist   Ambulation activity did not occur: Safety/medical concerns  Assist level: Contact Guard/Touching assist Assistive device: Walker-rolling     Walk 10 feet activity  Assist  Walk 10 feet activity did not occur: Safety/medical concerns  Assist level: Contact Guard/Touching assist Assistive device: Walker-rolling   Walk 50 feet activity   Assist Walk 50 feet with 2 turns activity did not occur: Safety/medical concerns         Walk 150 feet activity   Assist Walk 150 feet activity did not occur: Safety/medical concerns         Walk 10 feet on uneven surface  activity   Assist Walk 10 feet on uneven surfaces activity did not occur: Safety/medical concerns         Wheelchair     Assist Is the patient using a wheelchair?: Yes Type of Wheelchair: Manual     Wheelchair assist level: Total Assistance - Patient < 25% Max wheelchair distance: 150    Wheelchair 50 feet with 2 turns activity    Assist        Assist Level: Total Assistance - Patient < 25%   Wheelchair 150 feet activity     Assist      Assist Level: Total Assistance - Patient < 25%   Blood pressure (!) 156/52, pulse 68, temperature (!) 97.5 F (36.4 C), temperature source Oral, resp. rate 18, height 4\' 11"  (1.499 m), weight 52.5 kg, SpO2 97 %.  Medical Problem List and Plan: 1. 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  Antithrombotics: -DVT/anticoagulation:  Pharmaceutical: Lovenox             -antiplatelet therapy: DAPT X 3 months followed by plavix alone 3. Left shoulder pain/Pain Management: Continue Sportscreme to left shoulder and tylenol at bedtime. --Tylenol prn.  4. Mood: LCSW to follow for evaluation and support.              -antipsychotic agents: N/A 5. Neuropsych: This patient is not fully capable of making decisions on his own behalf. 6. Skin/Wound Care: Routine pressure relief measures.  7. Fluids/Electrolytes/Nutrition: Monitor I/O. Check  8. HTN: Monitor BP TID--continue  9. CKD III: Baseline SCr 1.7 to 1.8 range-->creat at baseline BUN normalizing              --Avoid nephrotoxic meds.   10. Hyperactive bladder w/nocturia: Has incontinence has been an issue with urgency-->does not like depends. Nocturia decreased with merabegron.  --toilet patient every 4 hours while awake.   11.  RA: Has had shoulder pain on and off--uses tylenol prn. Also with left hip contracture which pt states is old xray shows mild OA - no active synovitis  12. Vitamin B 12 deficiency: B-12 341-->no on weekly supplement 13. Pre-diabetes: Hgb A1C-5.8. Monitor FBS with weekly checks. 14.  Hypothyroid: Continue supplement.  15. Questionable constipation- has been 2 days- monitor closely.   16.  Left hip contracture ROM per PT, Xray unremarkable   LOS: 7 days A FACE TO FACE EVALUATION WAS PERFORMED  Charlett Blake 07/14/2021, 7:50 AM

## 2021-07-14 NOTE — Progress Notes (Signed)
Speech Language Pathology Daily Session Note  Patient Details  Name: [name] MRN: [id number] Date of Birth: [birth year]  Today's Date: 07/14/2021 SLP Individual Time: 1304-1405 SLP Individual Time Calculation (min): 61 min  Short Term Goals: Week 1: SLP Short Term Goal 1 (Week 1): Patient will be oriented x4 with min A for use of external aid SLP Short Term Goal 2 (Week 1): Patient will sustain attention for 10 minute duration with min-to-mod A verbal redirection SLP Short Term Goal 3 (Week 1): Patient will identify 2-3 areas of impairment to increase awareness of deficits with min-to-mod A verbal cues SLP Short Term Goal 4 (Week 1): Patient will identify safe vs. unsafe scenarios and generate appropriate solutions with min-to-mod A verbal/visual cues  Skilled Therapeutic Interventions: Skilled ST treatment focused on cognitive goals. Patient was seen with live interpreter. Patient exhibited adequate sustained attention for duration of today's session with good topic maintenance and no redirection necessary. SLP facilitated session by providing overall sup A for problem solving/reasoning, functional recall, and attention with novel card game "Blink." Pt exhibited good carry over and appeared highly motivated by this task. SLP also facilitated verbal reasoning by prompting patient to identify dangerous/unsafe scenarios and generating appropriate solutions. Patient provided logical responses with sup-to-min A cues to elaborate with adequate insight into safety considerations. Patient was left in wheelchair with alarm activated and immediate needs within reach at end of session. Continue per current plan of care.      Pain Pain Assessment Pain Score: 0-No pain  Therapy/Group: Individual Therapy  Patty Sermons 07/14/2021, 3:24 PM

## 2021-07-14 NOTE — Progress Notes (Signed)
Occupational Therapy Session Note  Patient Details  Name: [name] MRN: [id number] Date of Birth: 01-Dec-1928  Today's Date: 07/14/2021 OT Individual Time: 0905-1002 OT Individual Time Calculation (min): 57 min    Short Term Goals: Week 1:  OT Short Term Goal 1 (Week 1): Pt will complete standing grooming task with mod A. OT Short Term Goal 2 (Week 1): Pt will don shirt with mod A. OT Short Term Goal 3 (Week 1): Pt will complete toilet transfer with max A.  Skilled Therapeutic Interventions/Progress Updates:    Pt received semi-reclined in bed with nursing/interpreter present, reports ongoing L shoulder pain and LPN administered volteran gel, agreeable to therapy. Session focus on self-care retraining, activity tolerance, transfer retraining, LUE NMR in prep for improved ADL/IADL/func mobility performance + decreased caregiver burden. Came to sitting EOB to take medications with mod A to progress BLE off bed and to lift trunk. CGA to min A to maintain static sitting initially due to posterior bias and difficulty maintain BLE on floor. Stood with heavy min A to prevent posterior LOB to pull pants over B hips. Declined need for further ADL at this time. Ambulated > nurse's station with  +2 for w/c follow with overall min A to manage RW as pt with tendency to veer it towards the L, cues for increased L step length.   Participated in 2 rounds of corn hole using LUE to toss bags. Able to maintain balance with close S while reaching outside BOS to retrieve bags on his L with increased time to grasp. Ambulated > West elevators in similar manner as before. Ambulatory toilet transfer  to and from toilet with light min A for balance going up incline and for sequencing turns. MaxA for managing clothing due to poor initiation, already noted to have been incontinent of bladder with poor awareness. Completed seated pericare with S, total A to change out brief.  Pt left seated in TIS with daughter present with  safety belt alarm engaged, call bell in reach, and all immediate needs met.    Therapy Documentation Precautions:  Precautions Precautions: Fall Precaution Comments: strong posterior bias Restrictions Weight Bearing Restrictions: No  Pain: no c/o   ADL: See Care Tool for more details.  Therapy/Group: Individual Therapy  Volanda Napoleon MS, OTR/L  07/14/2021, 6:52 AM

## 2021-07-14 NOTE — Progress Notes (Signed)
Speech Language Pathology Weekly Progress and Session Note  Patient Details  Name: Tal Kempker MRN: [id number] Date of Birth: [birth year]  Beginning of progress report period: July 08, 2021 End of progress report period: July 15, 2021  Today's Date: 07/15/2021 SLP Individual Time: 1300-1400 SLP Individual Time Calculation (min): 60 min  Short Term Goals: Week 1: SLP Short Term Goal 1 (Week 1): Patient will be oriented x4 with min A for use of external aid SLP Short Term Goal 1 - Progress (Week 1): Met SLP Short Term Goal 2 (Week 1): Patient will sustain attention for 10 minute duration with min-to-mod A verbal redirection SLP Short Term Goal 2 - Progress (Week 1): Met SLP Short Term Goal 3 (Week 1): Patient will identify 2-3 areas of impairment to increase awareness of deficits with min-to-mod A verbal cues SLP Short Term Goal 3 - Progress (Week 1): Met SLP Short Term Goal 4 (Week 1): Patient will identify safe vs. unsafe scenarios and generate appropriate solutions with min-to-mod A verbal/visual cues SLP Short Term Goal 4 - Progress (Week 1): Met  New Short Term Goals: Week 2: SLP Short Term Goal 1 (Week 2): Patient will be oriented x4 with mod I for use of external aid SLP Short Term Goal 2 (Week 2): Patient will demonstrate selective attention within a mildly distracting environment with sup-to-min A verbal cues for redirection SLP Short Term Goal 3 (Week 2): Patient will demonstrate anticipatory awareness by identifying 3 tasks he can safely participate in at home vs. unsafe tasks with min A verbal cues SLP Short Term Goal 4 (Week 2): Patient will demonstrate appropriate reasoning to functional scenarios with sup A verbal cues  Weekly Progress Updates: Patient has made excellent gains and has met 4 of 4 STGs this reporting period. Patient currently demonstrates significantly improved orientation with use of external aids, sustained, and intellectual awareness and requires  overall sup-to-min A to complete basic and functional tasks accurately and safely. Patient and family education is ongoing. Patient would benefit from continued skilled SLP intervention to maximize cognitive functioning and overall functional independence prior to discharge. Pt is expected to continue to improve with cognition with ongoing skilled SLP intervention.    Intensity: Minumum of 1-2 x/day, 30 to 90 minutes Frequency: 3 to 5 out of 7 days Duration/Length of Stay: 3/10 Treatment/Interventions: Cueing hierarchy;Cognitive remediation/compensation;Internal/external aids;Functional tasks;Patient/family education;Therapeutic Activities   Daily Session Skilled Therapeutic Interventions: Skilled ST treatment focused on cognitive goals. Patient was oriented x4 with mod I for use of external aid. SLP facilitated session by providing sup A cues fading to mod I for problem solving/reasoning, functional recall, and attention with novel card game "Blink." Patient independently recalled instructions as learned during yesterday's session and implemented with excellent carryover. SLP increased complexity throughout task and patient was able to make effective modifications and adapt with each complexity level. SLP also facilitated verbal reasoning by prompting patient to identify unsafe home scenarios with independence and generate appropriate solutions with sup A cues. Pt required increased verbal explanation/question cues in order to generate solutions. Patient was left in bed with alarm activated and immediate needs within reach at end of session. Continue per current plan of care.      General    Pain Pain Assessment Pain Score: 0-No pain  Therapy/Group: Individual Therapy  Patty Sermons 07/14/2021, 4:16 PM

## 2021-07-15 NOTE — Progress Notes (Signed)
Physical Therapy Weekly Progress Note  Patient Details  Name: [name] MRN: [id number] Date of Birth: 07-13-1928  Beginning of progress report period: July 08, 2022 End of progress report period: July 16, 2021  Today's Date: 07/15/2021 PT Individual Time: 1005-1100 PT Individual Time Calculation (min): 55 min   Patient has met 3 of 3 short term goals.  Pt is making inconsistent progress towards LTG of supervision assist overall. Motor planning deficits prevent consistent movement patterns when fatigued resulting in moments of max assist, but overall pt has progressed to min assist overall for gait and transfers with RW. Daughter present for multiple treatments but has not initiated hands on education to this point.   Patient continues to demonstrate the following deficits muscle weakness and muscle joint tightness, decreased cardiorespiratoy endurance, motor apraxia and decreased coordination, decreased initiation, decreased attention, decreased awareness, decreased problem solving, decreased safety awareness, decreased memory, and delayed processing, and decreased sitting balance, decreased standing balance, decreased postural control, hemiplegia, and decreased balance strategies and therefore will continue to benefit from skilled PT intervention to increase functional independence with mobility.  Patient progressing toward long term goals..  Continue plan of care.  PT Short Term Goals Week 1:  PT Short Term Goal 1 (Week 1): Pt will perform bed mobility with min assist PT Short Term Goal 1 - Progress (Week 1): Met PT Short Term Goal 2 (Week 1): Pt will transfer to [facility] with mod assist consistenty PT Short Term Goal 2 - Progress (Week 1): Met PT Short Term Goal 3 (Week 1): Pt will ambulate 76f with mod assist and LRAD PT Short Term Goal 3 - Progress (Week 1): Met Week 2:  PT Short Term Goal 1 (Week 2): [facility] PT Short Term Goal 2 (Week  2): Pt will perform Berg balance assessment PT Short Term Goal 3 (Week 2): Pt will ambulate 1545fwith CGA consistently  Skilled Therapeutic Interventions/Progress Updates:   Pt received sitting in WC and agreeable to PT. Daughter present throughout session, reporting that pt is feeling significantly more fatigued than prior days, pt confirms through interpreter.   Pt treatment focused on dynamic balance training. Static standing in parallel bars from level surface to perform ball press, raise/lower, diagonals R and L with BUE supported with small therapy ball. Each performed x 10 with 1 min rest break between bouts. Static balance on airex pad to performed normal BOS 2 x 15 sec. Semitandem 2 x 10 sec each and forward press with small ball x 10. 1 min rest break between bouts due to fatigue. Supervision assist from level surface and CGA-min assist standing on airex pad to prevent posterior bias. Cues for weight shift into forefoot.   Sit<>stand transfers with min-CGA ain parallel bars and RW with moderate verbal and tactile cues initially to prevent posterior bias   Gait training with RW x 10062fith CGA-min assist for AD management in turns. Continues to demonstrate shuffle gait pattern and requires cues for improved step width and attention to the LLE in turns. .   Pt returned to room and performed stand pivot transfer to bed with min assist and RW. Sit>supine completed with supervision assist and increased time, and left supine in bed with call bell in reach and all needs met.        Therapy Documentation Precautions:  Precautions Precautions: Fall Precaution Comments: strong posterior bias Restrictions Weight Bearing Restrictions: No  Pain:  denies  Therapy/Group:  Individual Therapy  Lorie Phenix 07/15/2021, 11:04 AM

## 2021-07-15 NOTE — Progress Notes (Signed)
PROGRESS NOTE   Subjective/Complaints: " You have a nice day"  ROS: denies pain  Objective:   No results found. No results for input(s): WBC, HGB, HCT, PLT in the last 72 hours.  No results for input(s): NA, K, CL, CO2, GLUCOSE, BUN, CREATININE, CALCIUM in the last 72 hours.   Intake/Output Summary (Last 24 hours) at 07/15/2021 0717 Last data filed at 07/15/2021 0300 Gross per 24 hour  Intake 238 ml  Output 300 ml  Net -62 ml         Physical Exam: Vital Signs Blood pressure (!) 141/63, pulse (!) 59, temperature 97.8 F (36.6 C), resp. rate 16, height 4\' 11"  (1.499 m), weight 53 kg, SpO2 97 %.  General: No acute distress Mood and affect are appropriate Heart: Regular rate and rhythm no rubs murmurs or extra sounds Lungs: Clear to auscultation, breathing unlabored, no rales or wheezes Abdomen: Positive bowel sounds, soft nontender to palpation, nondistended Extremities: No clubbing, cyanosis, or edema Skin: No evidence of breakdown, no evidence of rash  Neurologic: Cranial nerves II through XII intact, motor strength is 5/5 in Right and4/5 L deltoid, bicep, tricep, grip, hip flexor, knee extensors, ankle dorsiflexor and plantar flexor Sensory exam normal sensation to light touch and proprioception in bilateral upper and lower extremities Cerebellar exam normal finger to nose to finger as well as heel to shin in bilateral upper and lower extremities Musculoskeletal: reduced Left hip abd/add, IR/ER . No joint swelling   Assessment/Plan: 1. Functional deficits which require 3+ hours per day of interdisciplinary therapy in a comprehensive inpatient rehab setting. Physiatrist is providing close team supervision and 24 hour management of active medical problems listed below. Physiatrist and rehab team continue to assess barriers to discharge/monitor patient progress toward functional and medical goals  Care  Tool:  Bathing    Body parts bathed by patient: Right arm, Left arm, Chest, Abdomen, Front perineal area, Face, Right upper leg, Left upper leg   Body parts bathed by helper: Buttocks, Right lower leg, Left lower leg, Right upper leg, Left upper leg     Bathing assist Assist Level: Minimal Assistance - Patient > 75%     Upper Body Dressing/Undressing Upper body dressing   What is the patient wearing?: Button up shirt    Upper body assist Assist Level: Maximal Assistance - Patient 25 - 49%    Lower Body Dressing/Undressing Lower body dressing      What is the patient wearing?: Incontinence brief, Pants     Lower body assist Assist for lower body dressing: Maximal Assistance - Patient 25 - 49%     Toileting Toileting Toileting Activity did not occur (Clothing management and hygiene only): N/A (no void or bm)  Toileting assist Assist for toileting: Moderate Assistance - Patient 50 - 74%     Transfers Chair/bed transfer  Transfers assist     Chair/bed transfer assist level: Minimal Assistance - Patient > 75%     Locomotion Ambulation   Ambulation assist   Ambulation activity did not occur: Safety/medical concerns  Assist level: Contact Guard/Touching assist Assistive device: Walker-rolling     Walk 10 feet activity   Assist  Walk  10 feet activity did not occur: Safety/medical concerns  Assist level: Contact Guard/Touching assist Assistive device: Walker-rolling   Walk 50 feet activity   Assist Walk 50 feet with 2 turns activity did not occur: Safety/medical concerns         Walk 150 feet activity   Assist Walk 150 feet activity did not occur: Safety/medical concerns         Walk 10 feet on uneven surface  activity   Assist Walk 10 feet on uneven surfaces activity did not occur: Safety/medical concerns         Wheelchair     Assist Is the patient using a wheelchair?: Yes Type of Wheelchair: Manual    Wheelchair assist  level: Total Assistance - Patient < 25% Max wheelchair distance: 150    Wheelchair 50 feet with 2 turns activity    Assist        Assist Level: Total Assistance - Patient < 25%   Wheelchair 150 feet activity     Assist      Assist Level: Total Assistance - Patient < 25%   Blood pressure (!) 141/63, pulse (!) 59, temperature 97.8 F (36.6 C), resp. rate 16, height 4\' 11"  (1.499 m), weight 53 kg, SpO2 97 %.  Medical Problem List and Plan: 1. 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  Antithrombotics: -DVT/anticoagulation:  Pharmaceutical: Lovenox             -antiplatelet therapy: DAPT X 3 months followed by plavix alone 3. Left shoulder pain/Pain Management: Continue Sportscreme to left shoulder and tylenol at bedtime. --Tylenol prn.  4. Mood: LCSW to follow for evaluation and support.              -antipsychotic agents: N/A 5. Neuropsych: This patient is not fully capable of making decisions on his own behalf. 6. Skin/Wound Care: Routine pressure relief measures.  7. Fluids/Electrolytes/Nutrition: Monitor I/O. Check  8. HTN: Monitor BP TID--continue  9. CKD III: Baseline SCr 1.7 to 1.8 range-->ELevated since Oct 2022, no nephrotoxic meds identified               BMP Latest Ref Rng & Units 07/11/2021 07/08/2021 07/01/2021  Glucose 70 - 99 mg/dL 103(H) 116(H) 111(H)  BUN 8 - 23 mg/dL 25(H) 38(H) 29(H)  Creatinine 0.61 - 1.24 mg/dL 1.70(H) 1.70(H) 1.54(H)  BUN/Creat Ratio 10 - 24 - - -  Sodium 135 - 145 mmol/L 138 139 137  Potassium 3.5 - 5.1 mmol/L 3.8 4.0 4.0  Chloride 98 - 111 mmol/L 103 106 104  CO2 22 - 32 mmol/L 25 24 24   Calcium 8.9 - 10.3 mg/dL 8.6(L) 9.3 9.6    10. Hyperactive bladder w/nocturia: Has incontinence has  been an issue with urgency-->does not like depends. Nocturia decreased with merabegron.  --toilet patient every 4 hours while awake.   11.  RA: Has had shoulder pain on and off--uses tylenol prn. Also with left hip contracture which pt states is old xray shows mild OA - no active synovitis  12. Vitamin B 12 deficiency: B-12 341-->no on weekly supplement 13. Pre-diabetes: Hgb A1C-5.8. Monitor FBS with weekly checks. 14. Hypothyroid: Continue  supplement.  15. Questionable constipation- has been 2 days- monitor closely.   16.  Left hip contracture ROM per PT, Xray unremarkable   LOS: 8 days A FACE TO FACE EVALUATION WAS PERFORMED  Charlett Blake 07/15/2021, 7:17 AM

## 2021-07-15 NOTE — Progress Notes (Signed)
Occupational Therapy Weekly Progress Note  Patient Details  Name: [name] MRN: [id number] Date of Birth: 01-30-1929  Beginning of progress report period: July 08, 2022 End of progress report period: July 15, 2021  Today's Date: 07/15/2021 OT Individual Time: 4709-6283 OT Individual Time Calculation (min): 69 min    Patient has met 3 of 3 short term goals.  Pt has made great progress this week towards LTG. Pt has demonstrated improved postural/trunk control, dynamic standing balance, orientation to place/situation, and functional use of LUE to presently complete UBD at min A level, LBD at max A level, toileting at mod A level, and toilet transfers with CGA to min A via ambulatory transfer with RW.  Pt cont to be primarily limited by posterior lean, decreased recall of split hand technique and mild L hemi. UE and hand currently assessed at [place]umm[place] level V and V respectively. Anticipate 24/7 S and min physical assist for ADL required upon DC.   Patient continues to demonstrate the following deficits: muscle weakness, decreased cardiorespiratoy endurance, impaired timing and sequencing, unbalanced muscle activation, motor apraxia, decreased coordination, and decreased motor planning, decreased midline orientation, decreased attention to left, and decreased motor planning, decreased initiation, decreased attention, decreased awareness, decreased problem solving, decreased safety awareness, and decreased memory, and decreased sitting balance, decreased standing balance, decreased postural control, hemiplegia, and decreased balance strategies and therefore will continue to benefit from skilled OT intervention to enhance overall performance with BADL and Reduce care partner burden.  Patient progressing toward long term goals..  Continue plan of care.  OT Short Term Goals Week 1:  OT Short Term Goal 1 (Week 1): Pt will complete standing grooming task with mod A. OT Short Term Goal 1 -  Progress (Week 1): Met OT Short Term Goal 2 (Week 1): Pt will don shirt with mod A. OT Short Term Goal 2 - Progress (Week 1): Met OT Short Term Goal 3 (Week 1): Pt will complete toilet transfer with max A. OT Short Term Goal 3 - Progress (Week 1): Met Week 2:  OT Short Term Goal 1 (Week 2): Pt will complete 2/3 toileting tasks with CGA. OT Short Term Goal 2 (Week 2): Pt will don pants with min A. OT Short Term Goal 3 (Week 2): Pt will maintain static sitting balance EOB for >5 min with close S.  Skilled Therapeutic Interventions/Progress Updates:     Pt received semi-reclined in bed with nursing present, no c/o pain, agreeable to therapy. Session focus on self-care retraining, activity tolerance, transfer retraining, LUE NMR, bimanual integration in prep for improved ADL/IADL/func mobility performance + decreased caregiver burden. Came to sitting EOB with mod A to progress BLE off bed and to lift trunk. Maintained static sitting balance EOB with close S this date, much improved posterior bias. Doffed shirt with S and increased time, light min A to pull new shirt down in back. Able to complete UB bathing and grooming EOB with S, as well. Declined need for additional ADL. Short ambulatory transfers throughout session with RW and min A to power up / facilitate hinging at hips. Mild L lean noted this date. Total A TIS transport to and from gym. Swapped out TIS for manual 16x16 chair due to improvement in trunk/postural control, although pt required max A to efficiently scoot back in chair due to difficulty motor planning transition.   Stood at bits to complete the following game x2 to to target static standing balance and LUE NMR:  -Single Target :  57.5% accuracy, 5.09 reaction time, 23 hits      67.65% accuracy, 4.89 reaction time, 23 hits  Completed the following tasks to target LUE NMR/FMC: pincer grasp and pull, putty squeeze, and rolling into log shape with theraputty; additionally, massed practice  of tossing ball back and forth with 1 lb dowel rod tossing/catching ball. Initially difficulty with bimanual integration to catch ball, but improved with practice.   Finally, readministrated 9HPT to L hand: 2 min 49 seconds, improved from 7 min 37 seconds at eval.   Pt left seated in w/c with daughter/interpreter present with safety belt alarm engaged, call bell in reach, and all immediate needs met.   Therapy Documentation Precautions:  Precautions Precautions: Fall Precaution Comments: strong posterior bias Restrictions Weight Bearing Restrictions: No  Pain: no c/o throughout   ADL: See Care Tool for more details.   Therapy/Group: Individual Therapy  Volanda Napoleon MS, OTR/L  07/15/2021, 6:56 AM

## 2021-07-16 DIAGNOSIS — I10 Essential (primary) hypertension: Secondary | ICD-10-CM

## 2021-07-16 DIAGNOSIS — N1832 Chronic kidney disease, stage 3b: Secondary | ICD-10-CM

## 2021-07-16 DIAGNOSIS — K5901 Slow transit constipation: Secondary | ICD-10-CM

## 2021-07-16 NOTE — Plan of Care (Signed)
°  Problem: Consults Goal: RH STROKE PATIENT EDUCATION Description: See Patient Education module for education specifics  Outcome: Progressing   Problem: RH BOWEL ELIMINATION Goal: RH STG MANAGE BOWEL WITH ASSISTANCE Description: STG Manage Bowel with mod I  Assistance. Outcome: Progressing Goal: RH STG MANAGE BOWEL W/MEDICATION W/ASSISTANCE Description: STG Manage Bowel with Medication with mod I  Assistance. Outcome: Progressing   Problem: RH BLADDER ELIMINATION Goal: RH STG MANAGE BLADDER WITH ASSISTANCE Description: STG Manage Bladder With  mod I Assistance Outcome: Progressing Goal: RH STG MANAGE BLADDER WITH MEDICATION WITH ASSISTANCE Description: STG Manage Bladder With Medication With mod I Assistance. Outcome: Progressing   Problem: RH SAFETY Goal: RH STG ADHERE TO SAFETY PRECAUTIONS W/ASSISTANCE/DEVICE Description: STG Adhere to Safety Precautions With cues Assistance/Device. Outcome: Progressing   Problem: RH SAFETY Goal: RH STG ADHERE TO SAFETY PRECAUTIONS W/ASSISTANCE/DEVICE Description: STG Adhere to Safety Precautions With cues Assistance/Device. Outcome: Progressing   Problem: RH PAIN MANAGEMENT Goal: RH STG PAIN MANAGED AT OR BELOW PT'S PAIN GOAL Description: At or below level 4 with prns Outcome: Progressing   Problem: RH KNOWLEDGE DEFICIT Goal: RH STG INCREASE KNOWLEDGE OF DIABETES Description: Patient and family will be able to manage prediabetes with dietary modifications using handouts and educational resources independently Outcome: Progressing Goal: RH STG INCREASE KNOWLEDGE OF HYPERTENSION Description: Patient and family will be able to manage HTN with medications and dietary modifications using handouts and educational resources independently Outcome: Progressing Goal: RH STG INCREASE KNOWLEGDE OF HYPERLIPIDEMIA Description: Patient and family will be able to manage HLD with medications and dietary modifications using handouts and educational  resources independently Outcome: Progressing Goal: RH STG INCREASE KNOWLEDGE OF STROKE PROPHYLAXIS Description: Patient and family will be able to manage secondary risks with medications and dietary modifications using handouts and educational resources independently Outcome: Progressing

## 2021-07-16 NOTE — Progress Notes (Signed)
Occupational Therapy Session Note  Patient Details  Name: [name] MRN: [id number] Date of Birth: [birth year]  Today's Date: 07/16/2021 OT Individual Time: 0909-1003 OT Individual Time Calculation (min): 54 min    Short Term Goals: Week 1:  OT Short Term Goal 1 (Week 1): Pt will complete standing grooming task with mod A. OT Short Term Goal 1 - Progress (Week 1): Met OT Short Term Goal 2 (Week 1): Pt will don shirt with mod A. OT Short Term Goal 2 - Progress (Week 1): Met OT Short Term Goal 3 (Week 1): Pt will complete toilet transfer with max A. OT Short Term Goal 3 - Progress (Week 1): Met Week 2:  OT Short Term Goal 1 (Week 2): Pt will complete 2/3 toileting tasks with CGA. OT Short Term Goal 2 (Week 2): Pt will don pants with min A. OT Short Term Goal 3 (Week 2): Pt will maintain static sitting balance EOB for >5 min with close S.  Skilled Therapeutic Interventions/Progress Updates:    Pt received seated EOB with NT, en route to bathroom, no c/o pain, agreeable to therapy. Session focus on self-care retraining, activity tolerance, LUE NMR/FMC, transfer retraining in prep for improved ADL/IADL/func mobility performance + decreased caregiver burden. Sit to stand and ambulated > toilet min to light mod A due to posterior/L lean, short step length on the L. Max A for LB clothing management due to L lean and difficulty grasping pants due to L hemiparesis. Had already had incontinent bowel movement with poor awareness. Mod A for thoroughness of pericare. Total A to change out soiled brief.   Seated at sink in w/c, bathed UB with S and increased time. Declined shower/further bathing. Total A to don button up shirt due to decreased L FMC and generalized weakness.   Total A w/c transport to and from gym. Seated at table, pt able to use open palmar grasp to retrieve and place level 1 resistive clothes pin on horizontal bar above shoulder height with increased time. Unable to progress to level 2  resistive clothes pin due to weakness.  Pt left seated in w/c with safety belt alarm engaged, call bell in reach, and all immediate needs met.    Therapy Documentation Precautions:  Precautions Precautions: Fall Precaution Comments: strong posterior bias Restrictions Weight Bearing Restrictions: No   Pain: no c/o throughout ADL: See Care Tool for more details.  Therapy/Group: Individual Therapy  Volanda Napoleon MS, OTR/L  07/16/2021, 6:53 AM

## 2021-07-16 NOTE — Progress Notes (Signed)
Speech Language Pathology Daily Session Note  Patient Details  Name: [name] MRN: [id number] Date of Birth: 1928/10/27  Today's Date: 07/16/2021 SLP Individual Time: 1300-1345 SLP Individual Time Calculation (min): 45 min  Short Term Goals: Week 2: SLP Short Term Goal 1 (Week 2): Patient will be oriented x4 with mod I for use of external aid SLP Short Term Goal 2 (Week 2): Patient will demonstrate selective attention within a mildly distracting environment with sup-to-min A verbal cues for redirection SLP Short Term Goal 3 (Week 2): Patient will demonstrate anticipatory awareness by identifying 3 tasks he can safely participate in at home vs. unsafe tasks with min A verbal cues SLP Short Term Goal 4 (Week 2): Patient will demonstrate appropriate reasoning to functional scenarios with sup A verbal cues  Skilled Therapeutic Interventions:  Pt was seen for skilled ST targeting cognitive goals.  Upon arrival, pt was in bed awake, alert, and agreeable to participating in treatment.  SLP facilitated the session with a novel card game targeting goals for selective attention.   When playing card game by its original rules, pt needed consistent max assist cues for recall of task rules and procedures.   When task was simplified to alleviate the cognitive burden of recalling task rules, pt was able to selectively attend to task in a mildly distracting environment to locate matches between two cards with min assist verbal cues. Pt was left in bed with bed alarm set and call bell within reach.  Continue per current plan of care.     Pain Pain Assessment Pain Scale: 0-10 Pain Score: 0-No pain  Therapy/Group: Individual Therapy  Lilyian Quayle, Selinda Orion 07/16/2021, 4:23 PM

## 2021-07-16 NOTE — Progress Notes (Signed)
PROGRESS NOTE   Subjective/Complaints: Patient seen sitting up in bed this morning.  Indicates he slept well overnight.  No reported issues overnight.  He is very pleasant and positive, working with putty in his left upper extremity.  ROS: Denies CP, SOB, N/V/D  Objective:   No results found. No results for input(s): WBC, HGB, HCT, PLT in the last 72 hours.  No results for input(s): NA, K, CL, CO2, GLUCOSE, BUN, CREATININE, CALCIUM in the last 72 hours.   Intake/Output Summary (Last 24 hours) at 07/16/2021 1442 Last data filed at 07/16/2021 0346 Gross per 24 hour  Intake --  Output 400 ml  Net -400 ml         Physical Exam: Vital Signs Blood pressure (!) 151/74, pulse 79, temperature 98.2 F (36.8 C), temperature source Oral, resp. rate 16, height 4\' 11"  (1.499 m), weight 56 kg, SpO2 99 %.  General: No acute distress Mood and affect are appropriate Heart: Regular rate and rhythm no rubs murmurs or extra sounds Lungs: Clear to auscultation, breathing unlabored, no rales or wheezes Abdomen: Positive bowel sounds, soft nontender to palpation, nondistended Extremities: No clubbing, cyanosis, or edema Skin: No evidence of breakdown, no evidence of rash Neuro: Alert Motor: RUE/RLE: 5/5 proximal distal LUE/LLE: 4 -/5 proximal distal with apraxia   Assessment/Plan: 1. Functional deficits which require 3+ hours per day of interdisciplinary therapy in a comprehensive inpatient rehab setting. Physiatrist is providing close team supervision and 24 hour management of active medical problems listed below. Physiatrist and rehab team continue to assess barriers to discharge/monitor patient progress toward functional and medical goals  Care Tool:  Bathing    Body parts bathed by patient: Right arm, Left arm, Chest, Abdomen, Front perineal area, Face, Right upper leg, Left upper leg   Body parts bathed by helper: Buttocks,  Right lower leg, Left lower leg, Right upper leg, Left upper leg     Bathing assist Assist Level: Minimal Assistance - Patient > 75%     Upper Body Dressing/Undressing Upper body dressing   What is the patient wearing?: Pull over shirt    Upper body assist Assist Level: Minimal Assistance - Patient > 75%    Lower Body Dressing/Undressing Lower body dressing      What is the patient wearing?: Incontinence brief, Pants     Lower body assist Assist for lower body dressing: Maximal Assistance - Patient 25 - 49%     Toileting Toileting Toileting Activity did not occur (Clothing management and hygiene only): N/A (no void or bm)  Toileting assist Assist for toileting: Moderate Assistance - Patient 50 - 74%     Transfers Chair/bed transfer  Transfers assist     Chair/bed transfer assist level: Minimal Assistance - Patient > 75%     Locomotion Ambulation   Ambulation assist   Ambulation activity did not occur: Safety/medical concerns  Assist level: Contact Guard/Touching assist Assistive device: Walker-rolling     Walk 10 feet activity   Assist  Walk 10 feet activity did not occur: Safety/medical concerns  Assist level: Contact Guard/Touching assist Assistive device: Walker-rolling   Walk 50 feet activity   Assist Walk 50 feet with  2 turns activity did not occur: Safety/medical concerns         Walk 150 feet activity   Assist Walk 150 feet activity did not occur: Safety/medical concerns         Walk 10 feet on uneven surface  activity   Assist Walk 10 feet on uneven surfaces activity did not occur: Safety/medical concerns         Wheelchair     Assist Is the patient using a wheelchair?: Yes Type of Wheelchair: Manual    Wheelchair assist level: Total Assistance - Patient < 25% Max wheelchair distance: 150    Wheelchair 50 feet with 2 turns activity    Assist        Assist Level: Total Assistance - Patient < 25%    Wheelchair 150 feet activity     Assist      Assist Level: Total Assistance - Patient < 25%   Blood pressure (!) 151/74, pulse 79, temperature 98.2 F (36.8 C), temperature source Oral, resp. rate 16, height 4\' 11"  (1.499 m), weight 56 kg, SpO2 99 %.  Medical Problem List and Plan: 1. 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  Antithrombotics: -DVT/anticoagulation:  Pharmaceutical: Lovenox             -antiplatelet therapy: DAPT X 3 months followed by Plavix alone 3. Left shoulder pain/Pain Management: Continue Sportscreme to left shoulder and tylenol at bedtime. --Tylenol prn.  4. Mood: LCSW to follow for evaluation and support.              -antipsychotic agents: N/A 5. Neuropsych: This patient is not fully capable of making decisions on his own behalf. 6. Skin/Wound Care: Routine pressure relief measures.  7. Fluids/Electrolytes/Nutrition: Monitor I/Os 8. HTN: Monitor BP TID--continue to monitor  Slightly labile on 2/25, monitor for trend 9. CKD III: Baseline SCr 1.7 to 1.8 range-->ELevated since Oct 2022, no nephrotoxic meds identified               BMP Latest Ref Rng & Units 07/11/2021 07/08/2021 07/01/2021  Glucose 70 - 99 mg/dL 103(H) 116(H) 111(H)  BUN 8 - 23 mg/dL 25(H) 38(H) 29(H)  Creatinine 0.61 - 1.24 mg/dL 1.70(H) 1.70(H) 1.54(H)  BUN/Creat Ratio 10 - 24 - - -  Sodium 135 - 145 mmol/L 138 139 137  Potassium 3.5 - 5.1 mmol/L 3.8 4.0 4.0  Chloride 98 - 111 mmol/L 103 106 104  CO2 22 - 32 mmol/L 25 24 24   Calcium 8.9 - 10.3 mg/dL 8.6(L) 9.3 9.6   Creatinine 1.70 on 2/20, labs ordered for Monday 10. Hyperactive bladder w/nocturia: Has incontinence has been an issue with urgency-->does not like depends. Nocturia decreased with merabegron.  --toilet patient every 4 hours while awake.   11.  RA: Has had shoulder pain on and off--uses tylenol prn. Also with left hip contracture which pt states is old xray shows mild  OA - no active synovitis  12. Vitamin B 12 deficiency: B-12 341-->no on weekly supplement 13. Pre-diabetes: Hgb A1C-5.8. Monitor FBS with weekly checks. 14. Hypothyroid: Continue supplement.  15.  Slow transit constipation  Improving  16.  Left hip contracture ROM per PT, Xray unremarkable   LOS: 9 days A FACE TO FACE EVALUATION WAS PERFORMED  Anjelique Makar Lorie Phenix 07/16/2021, 2:42 PM

## 2021-07-16 NOTE — Progress Notes (Signed)
Physical Therapy Session Note  Patient Details  Name: [name] MRN: [id number] Date of Birth: 05-05-1929  Today's Date: 07/16/2021 PT Individual Time: 0918-1000 PT Individual Time Calculation (min): 42 min   Short Term Goals: Week 1:  PT Short Term Goal 1 (Week 1): Pt will perform bed mobility with min assist PT Short Term Goal 1 - Progress (Week 1): Met PT Short Term Goal 2 (Week 1): Pt will transfer to [facility] with mod assist consistenty PT Short Term Goal 2 - Progress (Week 1): Met PT Short Term Goal 3 (Week 1): Pt will ambulate 15f with mod assist and LRAD PT Short Term Goal 3 - Progress (Week 1): Met Week 2:  PT Short Term Goal 1 (Week 2): [facility] PT Short Term Goal 2 (Week 2): Pt will perform Berg balance assessment PT Short Term Goal 3 (Week 2): Pt will ambulate 1515fwith CGA consistently  Skilled Therapeutic Interventions/Progress Updates:  Patient supine in bed on entrance to room. Patient alert and agreeable to PT session.   Patient with no pain complaint throughout session.  Therapeutic Activity: Transfers: Patient performed sit<>stand and stand pivot transfers throughout session with Min/ ModA initially and improving to MinA/ CGA by end of session. Provided verbal cues throughout for increasing forward lean for improved performance. Moved RW anterior to pt's BOS in order to promote forward lean.   Toilet transfer performed with CGA and pt requiring MinA for clothing mgmt for time as he requires time to initiate movement. Pericare with SUP.   Gait Training:  Patient ambulated 120 ft x2 using RW with minA for balance on first bout and CGA on second bout. During first bout, pt demos strong lean to L coupled with L lateral trunk flexion. Provided vc/ tc for upright posture, level gaze with intermittent cueing for increasing LLE step height/ length. Following NMR, pt's L lean improves and demonstrates less during second amb  bout.  Neuromuscular Re-ed: NMR facilitated during session with focus on standing balance and proprioception. Pt guided in sit <> stands with forced use of LLE with RLE on 2" step. Pt also guided in toe touches to 6" step placed close to toes for improved hip flexion. NMR performed for improvements in motor control and coordination, balance, sequencing, judgement, and self confidence/ efficacy in performing all aspects of mobility at highest level of independence.   Patient supine  in bed at end of session per pt request with brakes locked, bed alarm set, and all needs within reach.     Therapy Documentation Precautions:  Precautions Precautions: Fall Precaution Comments: strong posterior bias Restrictions Weight Bearing Restrictions: No General:   Vital Signs: Therapy Vitals Temp: 98.2 F (36.8 C) Temp Source: Oral Pulse Rate: 79 Resp: 16 BP: (!) 151/74 Patient Position (if appropriate): Lying Oxygen Therapy SpO2: 99 % O2 Device: Room Air Pain:  No pain complaint this session.   Therapy/Group: Individual Therapy  JuAlger SimonsT, DPT 07/16/2021, 7:52 AM

## 2021-07-17 NOTE — Progress Notes (Signed)
Speech Language Pathology Daily Session Note  Patient Details  Name: [name] MRN: [id number] Date of Birth: [birth year]  Today's Date: 07/17/2021 SLP Individual Time: 1300-1330 SLP Individual Time Calculation (min): 30 min  Short Term Goals: Week 2: SLP Short Term Goal 1 (Week 2): Patient will be oriented x4 with mod I for use of external aid SLP Short Term Goal 2 (Week 2): Patient will demonstrate selective attention within a mildly distracting environment with sup-to-min A verbal cues for redirection SLP Short Term Goal 3 (Week 2): Patient will demonstrate anticipatory awareness by identifying 3 tasks he can safely participate in at home vs. unsafe tasks with min A verbal cues SLP Short Term Goal 4 (Week 2): Patient will demonstrate appropriate reasoning to functional scenarios with sup A verbal cues  Skilled Therapeutic Interventions:  Pt was seen for skilled ST targeting cognitive goals.  Upon arrival, pt was seated upright in wheelchair with daughter and interpreter at bedside.  Pt was agreeable to participating in treatment today and was noted to be more verbose and tangential in comparison to yesterday's therapy session, requiring min assist verbal cues for redirection to the topic at hand.  Pt used the external aids in his room to orient to place and situation with supervision verbal cues and he identified "memory" as being impaired post CVA.  SLP reviewed and reinforced strategies to help compensate for memory changes and maximize recovery, emphasizing routines, writing information down, and keeping things in the same place.  Pt was somewhat perseverative on wanting to write down information not only about daily events but also of his time in the [employer] in [place].  Pt's daughter requested that SLP incorporate use of a memory notebook into future therapy sessions.  Pt was left in chair with family and interpreter present.  Continue per current plan of care.    Pain Pain  Assessment Pain Scale: 0-10 Pain Score: 0-No pain  Therapy/Group: Individual Therapy  Mecca Guitron, Selinda Orion 07/17/2021, 3:18 PM

## 2021-07-17 NOTE — Progress Notes (Signed)
Occupational Therapy Session Note  Patient Details  Name: [name] MRN: [id number] Date of Birth: 10-02-1928  Today's Date: 07/17/2021 OT Missed Time: 31 Minutes Missed Time Reason: Patient unwilling/refused to participate without medical reason   Skilled Therapeutic Interventions/Progress Updates:    Pt greeted in bed. Stated that he already showered and used the restroom this AM, also already changed clothes and brushed his teeth. When asked if he wanted to transfer OOB and/or engage in exercises pt declined. He said that he wanted to watch the soccer game today and do therapy tomorrow. Encouraged pt to participate in therapy however pt continued to politely declined. 60 minutes missed.  Therapy Documentation Precautions:  Precautions Precautions: Fall Precaution Comments: strong posterior bias Restrictions Weight Bearing Restrictions: No   ADL: ADL Eating: Set up Where Assessed-Eating: Bed level Grooming: Supervision/safety Where Assessed-Grooming: Sitting at sink Upper Body Bathing: Supervision/safety Where Assessed-Upper Body Bathing: Sitting at sink Lower Body Bathing: Moderate assistance Where Assessed-Lower Body Bathing: Standing at sink Upper Body Dressing: Maximal assistance Where Assessed-Upper Body Dressing: Sitting at sink Lower Body Dressing: Maximal assistance Where Assessed-Lower Body Dressing: Sitting at sink Toileting: Not assessed Toilet Transfer: Not assessed Tub/Shower Transfer: Not assessed Walk-In Shower Transfer: Not assessed  Therapy/Group: Individual Therapy  Madalin Hughart A Nissi Doffing 07/17/2021, 12:13 PM

## 2021-07-17 NOTE — Progress Notes (Signed)
Occupational Therapy Session Note  Patient Details  Name: [name] MRN: [id number] Date of Birth: 12-29-1928  Today's Date: 07/18/2021 OT Group Time: 1000-1055 OT Group Time Calculation (min): 55 min   Skilled Therapeutic Interventions/Progress Updates: Pt engaged in therapeutic w/c level dance group focusing on patient choice, UE/LE strengthening, salience, activity tolerance, and social participation. Pt was guided through various dance-based exercises involving UEs/LEs and trunk. All music was selected by group members. Emphasis placed on Lt NMR and activity tolerance. Pt exhibited very good participation today, daughter joining in attendance. He required verbal, tactile, and manual cues at times to integrate Lt side in exercises and to increase amplitude of movement as able. Daughter assisted pt in returning to room via w/c. Notified staff that he would like assistance returning to bed.       Therapy Documentation Precautions:  Precautions Precautions: Fall Precaution Comments: strong posterior bias Restrictions Weight Bearing Restrictions: No  Pain: no s/s pain during tx Pain Assessment Pain Scale: 0-10 Pain Score: 0-No pain ADL: ADL Eating: Set up Where Assessed-Eating: Bed level Grooming: Supervision/safety Where Assessed-Grooming: Sitting at sink Upper Body Bathing: Supervision/safety Where Assessed-Upper Body Bathing: Sitting at sink Lower Body Bathing: Moderate assistance Where Assessed-Lower Body Bathing: Standing at sink Upper Body Dressing: Maximal assistance Where Assessed-Upper Body Dressing: Sitting at sink Lower Body Dressing: Maximal assistance Where Assessed-Lower Body Dressing: Sitting at sink Toileting: Not assessed Toilet Transfer: Not assessed Tub/Shower Transfer: Not assessed Walk-In Shower Transfer: Not assessed  Therapy/Group: Group Therapy  Jenness Stemler A Aleksa Collinsworth 07/18/2021, 4:32 PM

## 2021-07-17 NOTE — Progress Notes (Signed)
Physical Therapy Session Note  Patient Details  Name: [name] MRN: [id number] Date of Birth: Aug 23, 1928  Today's Date: 07/17/2021 PT Individual Time: 1010-1103 PT Individual Time Calculation (min): 53 min   Short Term Goals: Week 2:  PT Short Term Goal 1 (Week 2): Pt will transfer to and from [facility] with CGA consistently PT Short Term Goal 2 (Week 2): Pt will perform Berg balance assessment PT Short Term Goal 3 (Week 2): Pt will ambulate 114ft with CGA consistently  Skilled Therapeutic Interventions/Progress Updates:    Pt received supine in bed and agreeable to therapy session. In-person interpreter present throughout session. Supine>sitting R EOB, HOB elevated and using bedrail, with min assist for trunk upright and for scooting hips forward. Sitting EOB with supervision for trunk control - doffed dirty shirt and donned clean shirt total assist for time management. Pt noted to have soiled brief. Sit>stand EOB>RW with min assist - cuing to bring feet back under BOS and increase anterior trunk lean - pt tends to push backs of legs against bed to rise to stand and maintain balance due to posterior lean. Pt appears to think thearpist provided him with urinal as he starts to void further in dirty brief, once completed, pt performed peri-care with set-up assist. Standing with CGA/light min assist while therapist donned clean brief dependently - pulled pants up over hips with mod assist for L side. L stand pivot EOB>w/c using RW with min assist for balance - cuing to step back fully to chair.  Transported to/from gym in w/c for time management and energy conservation.   Gait training 6ft using RW with light min assist - pt noted to have significant L LE hip external rotation with decreased L LE foot clearance and step length - maintains with excessive thoracic kyphosis and downward gaze - minimal improvement throughout despite attempts at cuing/facilitation.   Dynamic gait training via side stepping in  front of mat using RW with light min assist - pt continues to keep L LE excessively externally rotated and has increased difficulty stepping towards L due to decreased R weight shift and decreased lifting of L LE.   Transitioned to R/L lateral side stepping over hockey stick using L HHA with min assist for balance - x 15 reps minimum - with increased repetition pt demos increasing L LE lateral step length as initially he was not able to step fully over the stick and had limited foot clearance frequently kicking the stick but this all imrpoving.  Sit>supine on mat with min assist for L LE management. Supine bridging x 15 reps while sustaining hip adduction ball squeeze targeting co-activation of L hip internal rotators and hip extensors - minimally effective at achieving this goal as pt with difficulty understanding cuing to keep ball squeezed while raising/lowering hips. Supine>sitting L EOM with cuing for logroll technique to increase pt independence and lighter min assist for trunk upright.   Gait training 40ft back to w/c using RW transitioned to L HHA final ~66ft with light min assist for balance - pt actually demos improved L LE alignment without AD (decreased external rotation and improved foot clearance).   Transported back to room and pt agreeable to remain sitting up - left with needs in reach and seat belt alarm on.  Therapy Documentation Precautions:  Precautions Precautions: Fall Precaution Comments: strong posterior bias Restrictions Weight Bearing Restrictions: No   Pain:  No reports of pain throughout session.   Therapy/Group: Individual Therapy  Harmony ,  PT, DPT, NCS, CSRS  07/17/2021, 7:53 AM

## 2021-07-18 LAB — BASIC METABOLIC PANEL
Anion gap: 8 (ref 5–15)
BUN: 20 mg/dL (ref 8–23)
CO2: 25 mmol/L (ref 22–32)
Calcium: 8.9 mg/dL (ref 8.9–10.3)
Chloride: 107 mmol/L (ref 98–111)
Creatinine, Ser: 1.64 mg/dL — ABNORMAL HIGH (ref 0.61–1.24)
GFR, Estimated: 39 mL/min — ABNORMAL LOW (ref >60)
Glucose, Bld: 101 mg/dL — ABNORMAL HIGH (ref 70–99)
Potassium: 3.8 mmol/L (ref 3.5–5.1)
Sodium: 140 mmol/L (ref 135–145)

## 2021-07-18 LAB — CBC
HCT: 36.6 % — ABNORMAL LOW (ref 39.0–52.0)
Hemoglobin: 12 g/dL — ABNORMAL LOW (ref 13.0–17.0)
MCH: 29.6 pg (ref 26.0–34.0)
MCHC: 32.8 g/dL (ref 30.0–36.0)
MCV: 90.1 fL (ref 80.0–100.0)
Platelets: 234 10*3/uL (ref 150–400)
RBC: 4.06 MIL/uL — ABNORMAL LOW (ref 4.22–5.81)
RDW: 13 % (ref 11.5–15.5)
WBC: 9.6 10*3/uL (ref 4.0–10.5)
nRBC: 0 % (ref 0.0–0.2)

## 2021-07-18 MED ORDER — DOXAZOSIN MESYLATE 1 MG PO TABS
1.0000 mg | ORAL_TABLET | Freq: Every day | ORAL | Status: DC
Start: 2021-07-18 — End: 2021-07-29
  Administered 2021-07-18 – 2021-07-28 (×11): 1 mg via ORAL
  Filled 2021-07-18 (×11): qty 1

## 2021-07-18 NOTE — Progress Notes (Signed)
Physical Therapy Session Note  Patient Details  Name: [name] MRN: [id number] Date of Birth: [birth year]  Today's Date: 07/18/2021 PT Individual Time: 0918-1000 PT Individual Time Calculation (min): 42 min   Short Term Goals: Week 1:  PT Short Term Goal 1 (Week 1): Pt will perform bed mobility with min assist PT Short Term Goal 1 - Progress (Week 1): Met PT Short Term Goal 2 (Week 1): Pt will transfer to [facility] with mod assist consistenty PT Short Term Goal 2 - Progress (Week 1): Met PT Short Term Goal 3 (Week 1): Pt will ambulate 81f with mod assist and LRAD PT Short Term Goal 3 - Progress (Week 1): Met Week 2:  PT Short Term Goal 1 (Week 2): [facility] PT Short Term Goal 2 (Week 2): Pt will perform Berg balance assessment PT Short Term Goal 3 (Week 2): Pt will ambulate 1552fwith CGA consistently  Skilled Therapeutic Interventions/Progress Updates:  Patient supine in bed on entrance to room. Patient alert and agreeable to PT session. Dtr in room and states that pt did not sleep well last night. Pt agrees.   Patient with no pain complaint throughout session.  Therapeutic Activity: Bed Mobility: Patient performed supine --> sit with Min/ ModA and heavy multimodal cues for technique. Requires extra time to complete.  Transfers: Patient performed sit<>stand and stand pivot transfers throughout session with CGA improving to close supervision following NMR. Provided verbal cues for forward lean and consistent technique following NMR training for balance.  Neuromuscular Re-ed: NMR facilitated during session with focus on standing balance and decreasing posterior lean as well as sit<>stand performance. Pt guided in seated touch of 2# weighted bar to ball that therapist is holding outside of BOS. Pt is able to produce appropriate lean and use BUE to touch ball at each target for 60m19m Good return to upright seated posture between reaches. No LOB. Then  pt progressed in sit<>stand with . NMR performed for improvements in motor control and coordination, balance, sequencing, judgement, and self confidence/ efficacy in performing all aspects of mobility at highest level of independence.   Gait Training:  Patient ambulated 95'301/ 200' x1 using RW with CGA throughout. Demonstrated upright posture with intermittent mild tracking to L side. Provided vc/ tc for increasing step height/ length with LLE with heel strike. Also cued for increasing IR with each step during final 25' of longer amb bout. Pt only able to correct rotation for a few steps. When distracted in hallway by other people, pt does demonstrate decreased quality of gait. Requires focus for maintaining cued recommendations.   Patient seated upright  in w/c at end of session with brakes locked, no alarm set, and all needs within reach waiting for group session to start with OT in day room.      Therapy Documentation Precautions:  Precautions Precautions: Fall Precaution Comments: strong posterior bias Restrictions Weight Bearing Restrictions: No General:   Vital Signs:  Pain:  No pain complaint this day.   Therapy/Group: Individual Therapy  JulAlger Simons, DPT 07/18/2021, 10:06 AM

## 2021-07-18 NOTE — Progress Notes (Signed)
Speech Language Pathology Daily Session Note  Patient Details  Name: [name] MRN: [id number] Date of Birth: April 20, 1929  Today's Date: 07/18/2021 SLP Individual Time: 4696-2952 SLP Individual Time Calculation (min): 40 min  Short Term Goals: Week 2: SLP Short Term Goal 1 (Week 2): Patient will be oriented x4 with mod I for use of external aid SLP Short Term Goal 2 (Week 2): Patient will demonstrate selective attention within a mildly distracting environment with sup-to-min A verbal cues for redirection SLP Short Term Goal 3 (Week 2): Patient will demonstrate anticipatory awareness by identifying 3 tasks he can safely participate in at home vs. unsafe tasks with min A verbal cues SLP Short Term Goal 4 (Week 2): Patient will demonstrate appropriate reasoning to functional scenarios with sup A verbal cues  Skilled Therapeutic Interventions: Skilled ST treatment focused on cognitive goals. Patient seen with live interpreter. SLP facilitated session by providing min-to-mod A for development and use of memory notebook per pt and daughter request. Patient required min A cues to recall date to be written at the top, followed by mod A cues for recalling previous events from today, as well as organization of information in sequential order. Pt often mixed up previous events with today and required clear instruction on how to structure the notebook for most effective use. Patient wrote most information in [place] however wrote some words in [place]. Interpreter reported correct spelling when written in native language. Educated patient on organization strategies including writing day of week/date at the top of each new entry, writing in bullet points, and writing in notebook following each session so information is fresh on his mind. Spoke with daughter at end of session regarding some disorganization with timeline of events and ways to promote structure and organization when carried over outside of therapy  and at home. Patient appeared highly motivated to write and requested to remain in wheelchair so he could continue writing in notebook. Patient was left alarm activated and immediate needs within reach at end of session. Continue per current plan of care.      Pain Pain Assessment Pain Scale: 0-10 Pain Score: 0-No pain  Therapy/Group: Individual Therapy  Patty Sermons 07/18/2021, 3:45 PM

## 2021-07-18 NOTE — Progress Notes (Signed)
PROGRESS NOTE   Subjective/Complaints:  Having freq urination at noc despite Myrbetriq   ROS: Denies CP, SOB, N/V/D  Objective:   No results found. No results for input(s): WBC, HGB, HCT, PLT in the last 72 hours.  No results for input(s): NA, K, CL, CO2, GLUCOSE, BUN, CREATININE, CALCIUM in the last 72 hours.   Intake/Output Summary (Last 24 hours) at 07/18/2021 0800 Last data filed at 07/18/2021 0700 Gross per 24 hour  Intake 574 ml  Output 375 ml  Net 199 ml         Physical Exam: Vital Signs Blood pressure (!) 177/57, pulse 72, temperature 97.9 F (36.6 C), temperature source Oral, resp. rate 16, height 4\' 11"  (1.499 m), weight 53.5 kg, SpO2 97 %.  General: No acute distress Mood and affect are appropriate Heart: Regular rate and rhythm no rubs murmurs or extra sounds Lungs: Clear to auscultation, breathing unlabored, no rales or wheezes Abdomen: Positive bowel sounds, soft nontender to palpation, nondistended Extremities: No clubbing, cyanosis, or edema Skin: No evidence of breakdown, no evidence of rash Neuro: Alert Motor: RUE/RLE: 5/5 proximal distal LUE/LLE: 4 -/5 proximal distal with apraxia   Assessment/Plan: 1. Functional deficits which require 3+ hours per day of interdisciplinary therapy in a comprehensive inpatient rehab setting. Physiatrist is providing close team supervision and 24 hour management of active medical problems listed below. Physiatrist and rehab team continue to assess barriers to discharge/monitor patient progress toward functional and medical goals  Care Tool:  Bathing    Body parts bathed by patient: Right arm, Left arm, Chest, Abdomen, Front perineal area, Face, Right upper leg, Left upper leg   Body parts bathed by helper: Buttocks, Right lower leg, Left lower leg, Right upper leg, Left upper leg     Bathing assist Assist Level: Minimal Assistance - Patient > 75%      Upper Body Dressing/Undressing Upper body dressing   What is the patient wearing?: Pull over shirt    Upper body assist Assist Level: Minimal Assistance - Patient > 75%    Lower Body Dressing/Undressing Lower body dressing      What is the patient wearing?: Incontinence brief, Pants     Lower body assist Assist for lower body dressing: Maximal Assistance - Patient 25 - 49%     Toileting Toileting Toileting Activity did not occur (Clothing management and hygiene only): N/A (no void or bm)  Toileting assist Assist for toileting: Moderate Assistance - Patient 50 - 74%     Transfers Chair/bed transfer  Transfers assist     Chair/bed transfer assist level: Minimal Assistance - Patient > 75% Chair/bed transfer assistive device: Armrests, Programmer, multimedia   Ambulation assist   Ambulation activity did not occur: Safety/medical concerns  Assist level: Minimal Assistance - Patient > 75% Assistive device: Walker-rolling Max distance: 71ft   Walk 10 feet activity   Assist  Walk 10 feet activity did not occur: Safety/medical concerns  Assist level: Contact Guard/Touching assist Assistive device: Walker-rolling   Walk 50 feet activity   Assist Walk 50 feet with 2 turns activity did not occur: Safety/medical concerns  Walk 150 feet activity   Assist Walk 150 feet activity did not occur: Safety/medical concerns         Walk 10 feet on uneven surface  activity   Assist Walk 10 feet on uneven surfaces activity did not occur: Safety/medical concerns         Wheelchair     Assist Is the patient using a wheelchair?: Yes Type of Wheelchair: Manual    Wheelchair assist level: Total Assistance - Patient < 25% Max wheelchair distance: 150    Wheelchair 50 feet with 2 turns activity    Assist        Assist Level: Total Assistance - Patient < 25%   Wheelchair 150 feet activity     Assist      Assist Level:  Total Assistance - Patient < 25%   Blood pressure (!) 177/57, pulse 72, temperature 97.9 F (36.6 C), temperature source Oral, resp. rate 16, height 4\' 11"  (1.499 m), weight 53.5 kg, SpO2 97 %.  Medical Problem List and Plan: 1. 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  Antithrombotics: -DVT/anticoagulation:  Pharmaceutical: Lovenox             -antiplatelet therapy: DAPT X 3 months followed by Plavix alone 3. Left shoulder pain/Pain Management: Continue Sportscreme to left shoulder and tylenol at bedtime. --Tylenol prn.  4. Mood: LCSW to follow for evaluation and support.              -antipsychotic agents: N/A 5. Neuropsych: This patient is not fully capable of making decisions on his own behalf. 6. Skin/Wound Care: Routine pressure relief measures.  7. Fluids/Electrolytes/Nutrition: Monitor I/Os 8. HTN: Monitor BP TID--continue to monitor   Vitals:   07/17/21 2007 07/18/21 0314  BP: (!) 125/53 (!) 177/57  Pulse: 70 72  Resp: 16 16  Temp: 98.3 F (36.8 C) 97.9 F (36.6 C)  SpO2: 97% 97%  Controlled during the day, systolic elevation during nocturnal readings , trial low dose alpha blocker qhs which may also help with nocturia  9. CKD III: Baseline SCr 1.7 to 1.8 range-->ELevated since Oct 2022, no nephrotoxic meds identified               BMP Latest Ref Rng & Units 07/11/2021 07/08/2021 07/01/2021  Glucose 70 - 99 mg/dL 103(H) 116(H) 111(H)  BUN 8 - 23 mg/dL 25(H) 38(H) 29(H)  Creatinine 0.61 - 1.24 mg/dL 1.70(H) 1.70(H) 1.54(H)  BUN/Creat Ratio 10 - 24 - - -  Sodium 135 - 145 mmol/L 138 139 137  Potassium 3.5 - 5.1 mmol/L 3.8 4.0 4.0  Chloride 98 - 111 mmol/L 103 106 104  CO2 22 - 32 mmol/L 25 24 24   Calcium 8.9 - 10.3 mg/dL 8.6(L) 9.3 9.6   Creatinine 1.70 on 2/20, labs ordered for Monday 10. Hyperactive bladder w/nocturia: Has incontinence has been an issue with urgency-->does not like depends. Nocturia  decreased with merabegron. Trial low dose alpha blocker  --toilet patient every 4 hours while awake.   11.  RA: Has had shoulder pain on and off--uses tylenol prn. Also with left hip contracture which pt states is old xray shows mild OA - no active synovitis  12. Vitamin B 12 deficiency: B-12 341-->no on weekly supplement 13. Pre-diabetes: Hgb A1C-5.8. Monitor FBS with weekly checks. 14. Hypothyroid: Continue supplement.  15.  Slow transit constipation  Improving  16.  Left hip contracture ROM per PT, Xray  unremarkable   LOS: 11 days A FACE TO FACE EVALUATION WAS PERFORMED  Charlett Blake 07/18/2021, 8:00 AM

## 2021-07-19 DIAGNOSIS — I10 Essential (primary) hypertension: Secondary | ICD-10-CM

## 2021-07-19 DIAGNOSIS — E785 Hyperlipidemia, unspecified: Secondary | ICD-10-CM

## 2021-07-19 NOTE — Progress Notes (Signed)
Speech Language Pathology Daily Session Note  Patient Details  Name: [name] MRN: [id number] Date of Birth: [birth year]  Today's Date: 07/19/2021 SLP Individual Time: 1694-5038 SLP Individual Time Calculation (min): 42 min  Short Term Goals: Week 2: SLP Short Term Goal 1 (Week 2): Patient will be oriented x4 with mod I for use of external aid SLP Short Term Goal 2 (Week 2): Patient will demonstrate selective attention within a mildly distracting environment with sup-to-min A verbal cues for redirection SLP Short Term Goal 3 (Week 2): Patient will demonstrate anticipatory awareness by identifying 3 tasks he can safely participate in at home vs. unsafe tasks with min A verbal cues SLP Short Term Goal 4 (Week 2): Patient will demonstrate appropriate reasoning to functional scenarios with sup A verbal cues  Skilled Therapeutic Interventions: Skilled ST treatment focused on cognitive goals. Patient was accompanied by his daughter who graciously provided translation as needed throughout session. Patient understands some English. SLP facilitated session by providing min A for previous daily events, and mod A for organization and prioritizing information in memory notebook. Similar to yesterday, patient began recalling and writing events in sequential order however eventually interchanged timeline with past events and memories with minimal awareness. Spoke with daughter regarding external strategies to maintain awareness of orientation as well as daily schedule. Daughter verbalized a plan to assist with this. SLP then facilitated 4-step ADL and iADL sequencing with picture scenes with overall sup-to-min A. Patient verbalized sequential order with good attention to detail and emergent awareness. Patient was left in wheelchair with alarm activated and immediate needs within reach at end of session. Continue per current plan of care.      Pain Pain Assessment Pain Scale: 0-10 Pain Score: 0-No  pain  Therapy/Group: Individual Therapy  Patty Sermons 07/19/2021, 4:02 PM

## 2021-07-19 NOTE — Progress Notes (Signed)
PROGRESS NOTE   Subjective/Complaints:  Slept bettter last noc, nocturia x 1 (was x 4 prior night )   ROS: Denies CP, SOB, N/V/D  Objective:   No results found. Recent Labs    07/18/21 0715  WBC 9.6  HGB 12.0*  HCT 36.6*  PLT 234    Recent Labs    07/18/21 0715  NA 140  K 3.8  CL 107  CO2 25  GLUCOSE 101*  BUN 20  CREATININE 1.64*  CALCIUM 8.9     Intake/Output Summary (Last 24 hours) at 07/19/2021 0745 Last data filed at 07/18/2021 2315 Gross per 24 hour  Intake 340 ml  Output 375 ml  Net -35 ml         Physical Exam: Vital Signs Blood pressure (!) 155/58, pulse 64, temperature 98.2 F (36.8 C), resp. rate 16, height 4\' 11"  (1.499 m), weight 53.5 kg, SpO2 95 %.  General: No acute distress Mood and affect are appropriate Heart: Regular rate and rhythm no rubs murmurs or extra sounds Lungs: Clear to auscultation, breathing unlabored, no rales or wheezes Abdomen: Positive bowel sounds, soft nontender to palpation, nondistended Extremities: No clubbing, cyanosis, or edema Skin: No evidence of breakdown, no evidence of rash Neuro: Alert Motor: RUE/RLE: 5/5 proximal distal LUE/LLE: 4 -/5 proximal distal with apraxia   Assessment/Plan: 1. Functional deficits which require 3+ hours per day of interdisciplinary therapy in a comprehensive inpatient rehab setting. Physiatrist is providing close team supervision and 24 hour management of active medical problems listed below. Physiatrist and rehab team continue to assess barriers to discharge/monitor patient progress toward functional and medical goals  Care Tool:  Bathing    Body parts bathed by patient: Right arm, Left arm, Chest, Abdomen, Front perineal area, Face, Right upper leg, Left upper leg   Body parts bathed by helper: Buttocks, Right lower leg, Left lower leg, Right upper leg, Left upper leg     Bathing assist Assist Level: Minimal  Assistance - Patient > 75%     Upper Body Dressing/Undressing Upper body dressing   What is the patient wearing?: Pull over shirt    Upper body assist Assist Level: Minimal Assistance - Patient > 75%    Lower Body Dressing/Undressing Lower body dressing      What is the patient wearing?: Incontinence brief, Pants     Lower body assist Assist for lower body dressing: Maximal Assistance - Patient 25 - 49%     Toileting Toileting Toileting Activity did not occur (Clothing management and hygiene only): N/A (no void or bm)  Toileting assist Assist for toileting: Moderate Assistance - Patient 50 - 74%     Transfers Chair/bed transfer  Transfers assist     Chair/bed transfer assist level: Minimal Assistance - Patient > 75% Chair/bed transfer assistive device: Armrests, Programmer, multimedia   Ambulation assist   Ambulation activity did not occur: Safety/medical concerns  Assist level: Minimal Assistance - Patient > 75% Assistive device: Walker-rolling Max distance: 53ft   Walk 10 feet activity   Assist  Walk 10 feet activity did not occur: Safety/medical concerns  Assist level: Contact Guard/Touching assist Assistive device: Walker-rolling  Walk 50 feet activity   Assist Walk 50 feet with 2 turns activity did not occur: Safety/medical concerns         Walk 150 feet activity   Assist Walk 150 feet activity did not occur: Safety/medical concerns         Walk 10 feet on uneven surface  activity   Assist Walk 10 feet on uneven surfaces activity did not occur: Safety/medical concerns         Wheelchair     Assist Is the patient using a wheelchair?: Yes Type of Wheelchair: Manual    Wheelchair assist level: Total Assistance - Patient < 25% Max wheelchair distance: 150    Wheelchair 50 feet with 2 turns activity    Assist        Assist Level: Total Assistance - Patient < 25%   Wheelchair 150 feet activity      Assist      Assist Level: Total Assistance - Patient < 25%   Blood pressure (!) 155/58, pulse 64, temperature 98.2 F (36.8 C), resp. rate 16, height 4\' 11"  (1.499 m), weight 53.5 kg, SpO2 95 %.  Medical Problem List and Plan: 1. 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  Antithrombotics: -DVT/anticoagulation:  Pharmaceutical: Lovenox             -antiplatelet therapy: DAPT X 3 months followed by Plavix alone 3. Left shoulder pain/Pain Management: Continue Sportscreme to left shoulder and tylenol at bedtime. --Tylenol prn.  4. Mood: LCSW to follow for evaluation and support.              -antipsychotic agents: N/A 5. Neuropsych: This patient is not fully capable of making decisions on his own behalf. 6. Skin/Wound Care: Routine pressure relief measures.  7. Fluids/Electrolytes/Nutrition: Monitor I/Os 8. HTN: Monitor BP TID--continue to monitor   Vitals:   07/18/21 1949 07/19/21 0541  BP: (!) 110/56 (!) 155/58  Pulse: 71 64  Resp: 15 16  Temp: 97.7 F (36.5 C) 98.2 F (36.8 C)  SpO2: 95% 95%  Controlled during the day, systolic elevation during nocturnal / early am readings , some improvement , monitor on cardura 1mg  qhs 9. CKD III: Baseline SCr 1.7 to 1.8 range-->ELevated since Oct 2022, no nephrotoxic meds identified               BMP Latest Ref Rng & Units 07/18/2021 07/11/2021 07/08/2021  Glucose 70 - 99 mg/dL 101(H) 103(H) 116(H)  BUN 8 - 23 mg/dL 20 25(H) 38(H)  Creatinine 0.61 - 1.24 mg/dL 1.64(H) 1.70(H) 1.70(H)  BUN/Creat Ratio 10 - 24 - - -  Sodium 135 - 145 mmol/L 140 138 139  Potassium 3.5 - 5.1 mmol/L 3.8 3.8 4.0  Chloride 98 - 111 mmol/L 107 103 106  CO2 22 - 32 mmol/L 25 25 24   Calcium 8.9 - 10.3 mg/dL 8.9 8.6(L) 9.3   Creatinine 1.70 on 2/20, labs ordered for Monday 10. Hyperactive bladder w/nocturia: Has incontinence has been an issue with urgency-->does not like depends. Nocturia  decreased with merabegron. Trial low dose alpha blocker  --Nocturia x 1 on 2/27 11.  RA: Has had shoulder pain on and off--uses tylenol prn. Also with left hip contracture which pt states is old xray shows mild OA - no active synovitis  12. Vitamin B 12 deficiency: B-12 341-->no on weekly supplement 13. Pre-diabetes: Hgb A1C-5.8. Monitor FBS with weekly checks. 14. Hypothyroid: Continue supplement.  15.  Slow transit constipation  Improving  16.  Left hip contracture ROM per PT, Xray unremarkable   LOS: 12 days A FACE TO FACE EVALUATION WAS PERFORMED  Charlett Blake 07/19/2021, 7:45 AM

## 2021-07-19 NOTE — Progress Notes (Signed)
Pt slept well throughout the night. Continues to have frequent urination tonight. Pt will use urinal when offered during rounding. Pt denies any pain or distress over night shift.

## 2021-07-19 NOTE — Progress Notes (Signed)
Physical Therapy Session Note  Patient Details  Name: [name] MRN: [id number] Date of Birth: 31-Mar-1929  Today's Date: 07/19/2021 PT Individual Time: 0903-1000 PT Individual Time Calculation (min): 57 min   Short Term Goals: Week 1:  PT Short Term Goal 1 (Week 1): Pt will perform bed mobility with min assist PT Short Term Goal 1 - Progress (Week 1): Met PT Short Term Goal 2 (Week 1): Pt will transfer to [facility] with mod assist consistenty PT Short Term Goal 2 - Progress (Week 1): Met PT Short Term Goal 3 (Week 1): Pt will ambulate 72f with mod assist and LRAD PT Short Term Goal 3 - Progress (Week 1): Met Week 2:  PT Short Term Goal 1 (Week 2): [facility] PT Short Term Goal 2 (Week 2): Pt will perform Berg balance assessment PT Short Term Goal 3 (Week 2): Pt will ambulate 1588fwith CGA consistently  Skilled Therapeutic Interventions/Progress Updates:  Patient supine in bed on entrance to room. Patient alert and agreeable to PT session. Dtr in room for continuous family education and to assist with interpreting as in-person interpreter not present.   Patient with no pain complaint throughout session.  Therapeutic Activity: Bed Mobility: Patient performed supine --> sit with Min/ ModA for bringing UB to upright seated position. Requires extensive multimodal cueing to lean forward and shift weight anteriorly in order to scoot forward and place Bil feet on floor for improved balance. Transfers: Patient performed sit<>stand and stand pivot transfers throughout session with Min/ModA for increasing forward lean with initial attempts and improves to CGA during session. Provided verbal cues fortechnique including forward lean and foot/ hand positioning.  Gait Training:  Patient ambulated 175 feet using RW with close w/c follow from dtr. Demonstrated good L step height/ length with cueing until distracted visually. Provided vc/ tc for improved IR with good  toe alignment, .  Neuromuscular Re-ed: NMR facilitated during session with focus on reducing posterior bias and improving forward lean to improve sit<>stand performance. Pt guided in adjustment of walker placement further anterior to pt to promote forward reach. Split hand positioning assisted with pt's forward lean. Also guided in rise to stand with Bil hands on knees. Pt is able to progress forward lean for CGA to stance with no AD. Guided instep fwd/ bkwd with LLE with no AD to promote improved balance. Pt progressed to stance on red foam wedge to promote forward lean in standing balance. Requires Min to Mod A to maintain standing balance on wedge. NMR performed for improvements in motor control and coordination, balance, sequencing, judgement, and self confidence/ efficacy in performing all aspects of mobility at highest level of independence.   Patient seated upright  in w/c at end of session with brakes locked, belt alarm set, and all needs within reach.     Therapy Documentation Precautions:  Precautions Precautions: Fall Precaution Comments: strong posterior bias Restrictions Weight Bearing Restrictions: No General:   Vital Signs:  Pain: Pain Assessment Pain Scale: 0-10 Pain Score: 0-No pain  Therapy/Group: Individual Therapy  JuAlger SimonsT, DPT 07/19/2021, 10:15 AM

## 2021-07-19 NOTE — Progress Notes (Signed)
Occupational Therapy Session Note  Patient Details  Name: [name] MRN: [id number] Date of Birth: 19-Apr-1929  Today's Date: 07/19/2021 OT Individual Time: 1006-1057 OT Individual Time Calculation (min): 51 min    Short Term Goals: Week 1:  OT Short Term Goal 1 (Week 1): Pt will complete standing grooming task with mod A. OT Short Term Goal 1 - Progress (Week 1): Met OT Short Term Goal 2 (Week 1): Pt will don shirt with mod A. OT Short Term Goal 2 - Progress (Week 1): Met OT Short Term Goal 3 (Week 1): Pt will complete toilet transfer with max A. OT Short Term Goal 3 - Progress (Week 1): Met Week 2:  OT Short Term Goal 1 (Week 2): Pt will complete 2/3 toileting tasks with CGA. OT Short Term Goal 2 (Week 2): Pt will don pants with min A. OT Short Term Goal 3 (Week 2): Pt will maintain static sitting balance EOB for >5 min with close S.  Skilled Therapeutic Interventions/Progress Updates:     Pt received seated in w/c with daughter present, no c/o pain, agreeable to therapy. Session focus on self-care retraining, activity tolerance, transfer retraining, family education in prep for improved ADL/IADL/func mobility performance + decreased caregiver burden.  Ambulatory toilet transfer with CGA due to mild L/posterior bias. Required assist to change out brief due to incontinent episode of bladder, pt with poor awareness. Continent void of BM and light min A to CGA to pull pants back up and for thoroughness of pericare. Pt initially attempting to ambulate without pulling pants back up. Completed hand hygiene with close S in stance.  Family training re bed mobility and chair/bed transfers with [name]. Reviewed current deficits and impact on mobility performance, daughter with good understanding of safety needs. Facilitated 3x bed/chair transfers and supine > sit x1 with good technique and only min Vcs for technique from therapist. Pt requiring light min A to power up and min cues for split  hand technique. Emphasized that daughter can call staff for instance if pt requiring more than min A, and to update nursing if leaving to reapply safety alarms. Daughter verbalized understanding and with no additional questions at this time.  Pt left seated in recliner with daughter present, safety belt alarm engaged, call bell in reach, and all immediate needs met.   Therapy Documentation Precautions:  Precautions Precautions: Fall Precaution Comments: strong posterior bias Restrictions Weight Bearing Restrictions: No  Pain: no c/o   ADL: See Care Tool for more details.  Therapy/Group: Individual Therapy  Volanda Napoleon MS, OTR/L  07/19/2021, 6:51 AM

## 2021-07-20 LAB — GLUCOSE, CAPILLARY: Glucose-Capillary: 135 mg/dL — ABNORMAL HIGH (ref 70–99)

## 2021-07-20 NOTE — Progress Notes (Signed)
?                                                       PROGRESS NOTE ? ? ?Subjective/Complaints: ? ?Slept bettter last noc, nocturia x 1 (was x 4 prior night )  ? ?ROS: Denies CP, SOB, N/V/D ? ?Objective: ?  ?No results found. ?Recent Labs  ?  07/18/21 ?0715  ?WBC 9.6  ?HGB 12.0*  ?HCT 36.6*  ?PLT 234  ? ? ?Recent Labs  ?  07/18/21 ?0715  ?NA 140  ?K 3.8  ?CL 107  ?CO2 25  ?GLUCOSE 101*  ?BUN 20  ?CREATININE 1.64*  ?CALCIUM 8.9  ? ? ? ?Intake/Output Summary (Last 24 hours) at 07/20/2021 0837 ?Last data filed at 07/19/2021 2300 ?Gross per 24 hour  ?Intake 120 ml  ?Output 400 ml  ?Net -280 ml  ? ?  ? ?  ? ?Physical Exam: ?Vital Signs ?Blood pressure (!) 123/53, pulse 65, temperature 98 ?F (36.7 ?C), resp. rate 17, height _0  (1.499 m), weight 53.5 kg, SpO2 95 %. ? ?General: No acute distress ?Mood and affect are appropriate ?Heart: Regular rate and rhythm no rubs murmurs or extra sounds ?Lungs: Clear to auscultation, breathing unlabored, no rales or wheezes ?Abdomen: Positive bowel sounds, soft nontender to palpation, nondistended ?Extremities: No clubbing, cyanosis, or edema ?Skin: No evidence of breakdown, no evidence of rash ?Neuro: Alert ?Motor: RUE/RLE: 5/5 proximal distal ?LUE/LLE: 4 -/5 proximal distal with apraxia  ? ?Assessment/Plan: ?1. Functional deficits which require 3+ hours per day of interdisciplinary therapy in a comprehensive inpatient rehab setting. ?Physiatrist is providing close team supervision and 24 hour management of active medical problems listed below. ?Physiatrist and rehab team continue to assess barriers to discharge/monitor patient progress toward functional and medical goals ? ?Care Tool: ? ?Bathing ?   ?Body parts bathed by patient: Right arm, Left arm, Chest, Abdomen, Front perineal area, Face, Right upper leg, Left upper leg  ? Body parts bathed by helper: Buttocks, Right lower leg, Left lower leg, Right upper leg, Left upper leg ?  ?  ?Bathing assist Assist Level: Minimal  Assistance - Patient > 75% ?  ?  ?Upper Body Dressing/Undressing ?Upper body dressing   ?What is the patient wearing?: Pull over shirt ?   ?Upper body assist Assist Level: Minimal Assistance - Patient > 75% ?   ?Lower Body Dressing/Undressing ?Lower body dressing ? ? ?   ?What is the patient wearing?: Incontinence brief, Pants ? ?  ? ?Lower body assist Assist for lower body dressing: Maximal Assistance - Patient 25 - 49% ?   ? ?Toileting ?Toileting Toileting Activity did not occur (Probation officer and hygiene only): N/A (no void or bm)  ?Toileting assist Assist for toileting: Minimal Assistance - Patient > 75% ?  ?  ?Transfers ?Chair/bed transfer ? ?Transfers assist ?   ? ?Chair/bed transfer assist level: Minimal Assistance - Patient > 75% ?Chair/bed transfer assistive device: Armrests, Walker ?  ?Locomotion ?Ambulation ? ? ?Ambulation assist ? ? Ambulation activity did not occur: Safety/medical concerns ? ?Assist level: Minimal Assistance - Patient > 75% ?Assistive device: Walker-rolling ?Max distance: 50f  ? ?Walk 10 feet activity ? ? ?Assist ? Walk 10 feet activity did not occur: Safety/medical concerns ? ?Assist level: Contact Guard/Touching assist ?Assistive device: Walker-rolling  ? ?Walk  50 feet activity ? ? ?Assist Walk 50 feet with 2 turns activity did not occur: Safety/medical concerns ? ?  ?   ? ? ?Walk 150 feet activity ? ? ?Assist Walk 150 feet activity did not occur: Safety/medical concerns ? ?  ?  ?  ? ?Walk 10 feet on uneven surface  ?activity ? ? ?Assist Walk 10 feet on uneven surfaces activity did not occur: Safety/medical concerns ? ? ?  ?   ? ?Wheelchair ? ? ? ? ?Assist Is the patient using a wheelchair?: Yes ?Type of Wheelchair: Manual ?  ? ?Wheelchair assist level: Total Assistance - Patient < 25% ?Max wheelchair distance: 150  ? ? ?Wheelchair 50 feet with 2 turns activity ? ? ? ?Assist ? ?  ?  ? ? ?Assist Level: Total Assistance - Patient < 25%  ? ?Wheelchair 150 feet activity   ? ? ? ?Assist ?   ? ? ?Assist Level: Total Assistance - Patient < 25%  ? ?Blood pressure (!) 123/53, pulse 65, temperature 98 ?F (36.7 ?C), resp. rate 17, height _0  (1.499 m), weight 53.5 kg, SpO2 95 %. ? ?Medical Problem List and Plan: ?1. 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 Continue CIR PT, OT, SLP  ?Team conference today please see physician documentation under team conference tab, met with team  to discuss problems,progress, and goals. Formulized individual treatment plan based on medical history, underlying problem and comorbidities. ? ?2.  Antithrombotics: ?-DVT/anticoagulation:  Pharmaceutical: Lovenox ?            -antiplatelet therapy: DAPT X 3 months followed by Plavix alone ?3. Left shoulder pain/Pain Management: Continue Sportscreme to left shoulder and tylenol at bedtime. ?--Tylenol prn.  ?4. Mood: LCSW to follow for evaluation and support.  ?            -antipsychotic agents: N/A ?5. Neuropsych: This patient is not fully capable of making decisions on his own behalf. ?6. Skin/Wound Care: Routine pressure relief measures.  ?7. Fluids/Electrolytes/Nutrition: Monitor I/Os ?8. HTN: Monitor BP TID--continue to monitor ?  ?Vitals:  ? 07/19/21 2027 07/20/21 0408  ?BP: 127/60 (!) 123/53  ?Pulse: 73 65  ?Resp: 14 17  ?Temp: 98.3 ?F (36.8 ?C) 98 ?F (36.7 ?C)  ?SpO2: 98% 95%  ?Controlled during the day, systolic elevation during nocturnal / early am readings , some improvement , monitor on cardura 72m qhs- monitor for dizziness when up  ?9. CKD III: Baseline SCr 1.7 to 1.8 range-->ELevated since Oct 2022, no nephrotoxic meds identified  ?             ?BMP Latest Ref Rng & Units 07/18/2021 07/11/2021 07/08/2021  ?Glucose 70 - 99 mg/dL 101(H) 103(H) 116(H)  ?BUN 8 - 23 mg/dL 20 25(H) 38(H)  ?Creatinine 0.61 - 1.24 mg/dL 1.64(H) 1.70(H) 1.70(H)  ?BUN/Creat Ratio 10 - 24 - - -  ?Sodium 135 - 145 mmol/L 140 138 139  ?Potassium 3.5 - 5.1 mmol/L 3.8 3.8 4.0  ?Chloride 98 - 111  mmol/L 107 103 106  ?CO2 22 - 32 mmol/L _1 ?Calcium 8.9 - 10.3 mg/dL 8.9 8.6(L) 9.3  ? Creatinine 1.70 on 2/20, labs ordered for Monday ?10. Hyperactive bladder w/nocturia: Has incontinence has been an issue with urgency-->does not like depends. Nocturia decreased with merabegron pluslow dose alpha blocker  ?--Nocturia x 1 on 2/27 ?11.  RA: Has had shoulder pain on and off--uses tylenol prn. Also with left hip contracture which  pt states is old xray shows mild OA - no active synovitis  ?12. Vitamin B 12 deficiency: B-12 341-->no on weekly supplement ?13. Pre-diabetes: Hgb A1C-5.8. Monitor FBS with weekly checks. ?14. Hypothyroid: Continue supplement.  ?15.  Slow transit constipation ? Improving ? 16.  Left hip contracture ROM per PT, Xray unremarkable  ? ?LOS: ?13 days ?A FACE TO FACE EVALUATION WAS PERFORMED ? ?Luanna Salk Hazen Brumett ?07/20/2021, 8:37 AM  ? ? ? ?

## 2021-07-20 NOTE — Patient Care Conference (Signed)
Inpatient RehabilitationTeam Conference and Plan of Care Update ?Date: 07/20/2021   Time: 10:21 AM  ? ? ?Patient Name: [name] ?Medical Record Number: [id number]  ?Date of Birth: [birth year] ?Sex: Male         ?Room/Bed: 4M06C/4M06C-01 ?Payor Info: Payor: Marine scientist / [facility]: [facility] / Product Type: *No Product type* /   ? ?Admit Date/Time:  07/07/2021  2:48 PM ? ?Primary Diagnosis:  Stroke (cerebrum) ([place]) ? ?Hospital Problems: Principal Problem: ?  Stroke (cerebrum) ([place]) ?Active Problems: ?  Slow transit constipation ? ? ? ?Expected Discharge Date: Expected Discharge Date: 07/29/21 ? ?Team Members Present: ?Physician leading conference: Dr. Alysia Penna ?Social Worker Present: Erlene Quan, BSW ?Nurse Present: Dorien Chihuahua, RN ?PT Present: Alden Hipp, PT ?OT Present: Providence Lanius, OT ?SLP Present: Sherren Kerns, SLP ?PPS Coordinator present : Gunnar Fusi, SLP ? ?   Current Status/Progress Goal Weekly Team Focus  ?Bowel/Bladder ? ? mixed cont of B/B Pt ablet to ask for the urinal. Frequent urination  Toilet Q 2 hours  regain cont of B/B   ?Swallow/Nutrition/ Hydration ? ?           ?ADL's ? ? min UBD (pull over shirt), bathing at sink, oral care/grooming in stance; min to mod toileting, CGA toilet transfers with RW; continues to fluctuate, but between S and heavy min A; daughter has participate in basic t/f training; improved 9HPT on L hand  min A LBD, shower transfer goals; CGA toilet t/fs/toileting, S remaining ADL  LUE NMR, transfer/balance/self-care retraining, pt/family/DME/AE education   ?Mobility ? ? Bed mobility = Min/ ModA; Transfers = MinA; Gait = up to 200' (with fatigue and quality breakdown) using RW and inconsistent CGA to [place] with constant vc throughout for increasing L step height/ length  Bed mobility with MinA; Transfers with MinA; Ambulation with CGA/ MinA using LRAD, w/c mobility with CGA  continued standing balance/ anterior weight shift,  L NMR, L  attention, initiation, safety awareness, transfers, gait   ?Communication ? ?           ?Safety/Cognition/ Behavioral Observations ? min A basic cognition  sup A for orientation with external aids, sustained attention, intellectual/emergent awareness, sup-to-min A for problem solving  memory notebook, prob solving, attention, organization   ?Pain ? ? Mild back pain treated with tylenol 4/10  Pt's pain remain controlled  Assess pain Q shift   ?Skin ? ? Skin intact No injury  Skin remains intact  Turn Q 2 hours while in bed, shift weight while in chair to maintain skin.   ? ? ?Discharge Planning:  ?d/c back to ILF or ALF   ?Team Discussion: ?Patient with mild left hemiplegia and posterior bias post stroke. Function fluctuates with fatigue and he drags left foot when tired.  Continue to work on attention, problem solving, and safety awareness. ? ?Patient on target to meet rehab goals: ?yes, currently needs min assist for transfers and able to ambulate up to 200' using a rolling walker and CGA - Mod assist. Goals for discharge set for min assist. ? ? ?*See Care Plan and progress notes for long and short-term goals.  ? ?Revisions to Treatment Plan:  ?Upgraded OT toileting goals to supervision level ?  ?Teaching Needs: ?Safety, transfers, toileting, medications, secondary risk management, etc. ?  ?Current Barriers to Discharge: ?Decreased caregiver support ? ?Possible Resolutions to Barriers: ?Family education with daughter ?24/7 supervision recommended ?HH follow up services recommended ?  ? ? Medical Summary ?Current Status:  BP control improving, working on constipation and fluid intake, amb distance improving , labs stable ? Barriers to Discharge: Other (comments) ? Barriers to Discharge Comments: fluctuating assistance level associated with fatigue ?Possible Resolutions to [employer]: cont to work on endurance, balance and family training ? ? ?Continued Need for Acute Rehabilitation Level of Care: The  patient requires daily medical management by a physician with specialized training in physical medicine and rehabilitation for the following reasons: ?Direction of a multidisciplinary physical rehabilitation program to maximize functional independence : Yes ?Medical management of patient stability for increased activity during participation in an intensive rehabilitation regime.: Yes ?Analysis of laboratory values and/or radiology reports with any subsequent need for medication adjustment and/or medical intervention. : Yes ? ? ?I attest that I was present, lead the team conference, and concur with the assessment and plan of the team. ? ? ?Dorien Chihuahua B ?07/20/2021, 4:25 PM  ? ? ? ? ? ? ?

## 2021-07-20 NOTE — Progress Notes (Signed)
Physical Therapy Session Note ? ?Patient Details  ?Name: [name] ?MRN: [id number] ?Date of Birth: 1928-07-10 ? ?Today's Date: 07/20/2021 ?PT Individual Time: 4098-1191 ?PT Individual Time Calculation (min): 58 min  ? ?Short Term Goals: ?Week 1:  PT Short Term Goal 1 (Week 1): Pt will perform bed mobility with min assist ?PT Short Term Goal 1 - Progress (Week 1): Met ?PT Short Term Goal 2 (Week 1): Pt will transfer to [facility] with mod assist consistenty ?PT Short Term Goal 2 - Progress (Week 1): Met ?PT Short Term Goal 3 (Week 1): Pt will ambulate 30f with mod assist and LRAD ?PT Short Term Goal 3 - Progress (Week 1): Met ? ?Skilled Therapeutic Interventions/Progress Updates:  ?Patient seated upright in w/c on entrance to room. Patient alert and agreeable to PT session. Dtr present for session for family education and to provide interpreting as in-person interpreter not present. ? ?Patient with no pain complaint throughout session. ? ?Therapeutic Activity: ?Transfers: Patient performed sit<>stand and stand pivot transfers throughout session with CGA/ supervision and good forward lean. Provided verbal cues for split hand positioning for improved lean and push assist. ? ?Car transfer performed with CGA for technique and good effort from pt.  ? ?Gait Training:  ?Patient ambulated >175 ft using RW with CGA. Demonstrated L ER during ambulation. Provided vc/ tc for increasing L step height/ length, upright posture, attn to task. Quality of LLE stepping  decreases with visual distractions. Requiring redirection back to task. On return to room, dtr provides CGA for ambulation with RW over 100 feet. Dtr providing vc prn for increasing L step height/ length.  ? ?Stair training initiated with pt requiring education on technique prior to performance, max vc throughout for appropriate technique and leading LE. Performs with CGA to ascend and initially MinA to descend for step clearance and improving to CGA with vc.  ? ? ?Patient  seated  in w/c at end of session with brakes locked, no alarm set as pt with dtr, education with dtr re: setup of alarm belt, and all needs within reach. ?   ? ?Therapy Documentation ?Precautions:  ?Precautions ?Precautions: Fall ?Precaution Comments: strong posterior bias ?Restrictions ?Weight Bearing Restrictions: No ?General: ?  ?Vital Signs: ? ?Pain: ? No pain complaint this session. ? ?Therapy/Group: Individual Therapy ? ?JAlger SimonsPT, DPT ?07/20/2021, 1:25 PM  ?

## 2021-07-20 NOTE — Progress Notes (Signed)
Occupational Therapy Session Note ? ?Patient Details  ?Name: [name] ?MRN: [id number] ?Date of Birth: 09-06-1928 ? ?Today's Date: 07/20/2021 ?OT Individual Time: [phone] ?OT Individual Time Calculation (min): 53 min  ? ? ?Short Term Goals: ?Week 1:  OT Short Term Goal 1 (Week 1): Pt will complete standing grooming task with mod A. ?OT Short Term Goal 1 - Progress (Week 1): Met ?OT Short Term Goal 2 (Week 1): Pt will don shirt with mod A. ?OT Short Term Goal 2 - Progress (Week 1): Met ?OT Short Term Goal 3 (Week 1): Pt will complete toilet transfer with max A. ?OT Short Term Goal 3 - Progress (Week 1): Met ?Week 2:  OT Short Term Goal 1 (Week 2): Pt will complete 2/3 toileting tasks with CGA. ?OT Short Term Goal 2 (Week 2): Pt will don pants with min A. ?OT Short Term Goal 3 (Week 2): Pt will maintain static sitting balance EOB for >5 min with close S. ? ?Skilled Therapeutic Interventions/Progress Updates:  ?  Pt received semi-reclined in bed with daughter present, no c/o pain, agreeable to therapy. Session focus on self-care retraining, activity tolerance, transfer retraining, AE in prep for improved ADL/IADL/func mobility performance + decreased caregiver burden. Came to sitting EOB with min A to lift trunk and assist LUE placement on bed rail. Maintained static sitting balance with close S this date. Ambulated > sink with CGA and RW due to L lean, daughter able to appropriately facilitate with only cues to use gait belt.  ? ?Bathed UB seated with S, required mod A to remove shirt from overhead. Donned button up shirt max A to thread BUE, but able to button up with use of button hook and min A. Pt very excited with his success using button hook as he prefers to wear button up shirts vs T shirts. Donned pants with min A to thread LLE this date. Completed oral care with dentures and shaved with CGA in standing, min A for thoroughness for shaving.  ? ?Pt left seated in w/c with daughter present with safety belt alarm  engaged, call bell in reach, and all immediate needs met.  ? ? ?Therapy Documentation ?Precautions:  ?Precautions ?Precautions: Fall ?Precaution Comments: strong posterior bias ?Restrictions ?Weight Bearing Restrictions: No ? ?Pain: no c/o ?  ?ADL: See Care Tool for more details. ? ? ?Therapy/Group: Individual Therapy ? ?Volanda Napoleon MS, OTR/L ? ?07/20/2021, 6:50 AM ?

## 2021-07-20 NOTE — Progress Notes (Addendum)
RN rounding complete. Pt is asleep, waken to assess needs and offer urinal. PT urinated in urinal. Turned from left to right and back to supine. Pt continues to have frequent urination at night. No other distress noted at this time.  ? ?Pt had an uneventful night and rested well without any distress. ?

## 2021-07-20 NOTE — Progress Notes (Signed)
Speech Language Pathology Daily Session Note ? ?Patient Details  ?Name: [name] ?MRN: [id number] ?Date of Birth: [birth year] ? ?Today's Date: 07/20/2021 ?SLP Individual Time: [id number] ?SLP Individual Time Calculation (min): 45 min ? ?Short Term Goals: ?Week 2: SLP Short Term Goal 1 (Week 2): Patient will be oriented x4 with mod I for use of external aid ?SLP Short Term Goal 2 (Week 2): Patient will demonstrate selective attention within a mildly distracting environment with sup-to-min A verbal cues for redirection ?SLP Short Term Goal 3 (Week 2): Patient will demonstrate anticipatory awareness by identifying 3 tasks he can safely participate in at home vs. unsafe tasks with min A verbal cues ?SLP Short Term Goal 4 (Week 2): Patient will demonstrate appropriate reasoning to functional scenarios with sup A verbal cues ? ?Skilled Therapeutic Interventions: Skilled ST treatment focused on cognitive goals. Pt seen with live interpreter this date. SLP facilitated session by providing sup-to-min A verbal redirection for topic maintenance and attention to task. Pt enjoys sharing stories of his past and has been known to be internally distracted by this. Once redirected and expectations were established, pt maintained attention for duration of session without verbal redirection. Patient alternated attention by locating various items in a connect-the-dots/pictures type activity with sup A. Patient also completed visual memory task following discussion on internal and external memory strategies to achieve 50% accuracy prior to education, and 75% accuracy during 2nd attempt, and 85% accuracy during 3rd attempt following 10 minute delay. Patient was left in recliner with alarm activated and immediate needs within reach at end of session. Continue per current plan of care.   ?   ?Pain ?Pain Assessment ?Pain Scale: 0-10 ?Pain Score: 0-No pain ? ?Therapy/Group: Individual Therapy ? ?[name] ?07/20/2021, 4:42 PM ?

## 2021-07-20 NOTE — Progress Notes (Signed)
Patient ID: [name], male   DOB: [birth year], 88 y.o.   MRN: [id number] ?Team Conference Report to Patient/Family ? ?Team Conference discussion was reviewed with the patient and caregiver, including goals, any changes in plan of care and target discharge date.  Patient and caregiver express understanding and are in agreement.  The patient has a target discharge date of 07/29/21. ? ?Sw met with patient daughter and provided team conference updates. Daughter requesting OT before PT. Daughter also requesting more time with PT and OT vs. SLP. No additional questions or concerns, sw will continue to follow up.  ? ?[name] ?07/20/2021, 1:40 PM  ?

## 2021-07-21 NOTE — Progress Notes (Signed)
?                                                       PROGRESS NOTE ? ? ?Subjective/Complaints: ? ? ? ?ROS: Denies CP, SOB, N/V/D ? ?Objective: ?  ?No results found. ?Recent Labs  ?  07/18/21 ?0715  ?WBC 9.6  ?HGB 12.0*  ?HCT 36.6*  ?PLT 234  ? ? ?Recent Labs  ?  07/18/21 ?0715  ?NA 140  ?K 3.8  ?CL 107  ?CO2 25  ?GLUCOSE 101*  ?BUN 20  ?CREATININE 1.64*  ?CALCIUM 8.9  ? ? ? ?Intake/Output Summary (Last 24 hours) at 07/21/2021 0653 ?Last data filed at 07/21/2021 0500 ?Gross per 24 hour  ?Intake 600 ml  ?Output 155 ml  ?Net 445 ml  ? ?  ? ?  ? ?Physical Exam: ?Vital Signs ?Blood pressure (!) 141/58, pulse 64, temperature 97.7 ?F (36.5 ?C), temperature source Oral, resp. rate 18, height 4\' 11"  (1.499 m), weight 53.5 kg, SpO2 97 %. ? ?General: No acute distress ?Mood and affect are appropriate ?Heart: Regular rate and rhythm no rubs murmurs or extra sounds ?Lungs: Clear to auscultation, breathing unlabored, no rales or wheezes ?Abdomen: Positive bowel sounds, soft nontender to palpation, nondistended ?Extremities: No clubbing, cyanosis, or edema ?Skin: No evidence of breakdown, no evidence of rash ?Neuro: Alert ?Motor: RUE/RLE: 5/5 proximal distal ?LUE/LLE: 4 -/5 proximal distal with apraxia  ? ?Assessment/Plan: ?1. Functional deficits which require 3+ hours per day of interdisciplinary therapy in a comprehensive inpatient rehab setting. ?Physiatrist is providing close team supervision and 24 hour management of active medical problems listed below. ?Physiatrist and rehab team continue to assess barriers to discharge/monitor patient progress toward functional and medical goals ? ?Care Tool: ? ?Bathing ?   ?Body parts bathed by patient: Right arm, Left arm, Chest, Abdomen, Face  ? Body parts bathed by helper: Buttocks, Right lower leg, Left lower leg, Right upper leg, Left upper leg ?  ?  ?Bathing assist Assist Level: Supervision/Verbal cueing ?  ?  ?Upper Body Dressing/Undressing ?Upper body dressing   ?What is the  patient wearing?: Button up shirt ?   ?Upper body assist Assist Level: Maximal Assistance - Patient 25 - 49% ?   ?Lower Body Dressing/Undressing ?Lower body dressing ? ? ?   ?What is the patient wearing?: Pants ? ?  ? ?Lower body assist Assist for lower body dressing: Minimal Assistance - Patient > 75% ?   ? ?Toileting ?Toileting Toileting Activity did not occur (Probation officer and hygiene only): N/A (no void or bm)  ?Toileting assist Assist for toileting: Minimal Assistance - Patient > 75% ?  ?  ?Transfers ?Chair/bed transfer ? ?Transfers assist ?   ? ?Chair/bed transfer assist level: Minimal Assistance - Patient > 75% ?Chair/bed transfer assistive device: Armrests, Walker ?  ?Locomotion ?Ambulation ? ? ?Ambulation assist ? ? Ambulation activity did not occur: Safety/medical concerns ? ?Assist level: Minimal Assistance - Patient > 75% ?Assistive device: Walker-rolling ?Max distance: 45ft  ? ?Walk 10 feet activity ? ? ?Assist ? Walk 10 feet activity did not occur: Safety/medical concerns ? ?Assist level: Contact Guard/Touching assist ?Assistive device: Walker-rolling  ? ?Walk 50 feet activity ? ? ?Assist Walk 50 feet with 2 turns activity did not occur: Safety/medical concerns ? ?  ?   ? ? ?  Walk 150 feet activity ? ? ?Assist Walk 150 feet activity did not occur: Safety/medical concerns ? ?  ?  ?  ? ?Walk 10 feet on uneven surface  ?activity ? ? ?Assist Walk 10 feet on uneven surfaces activity did not occur: Safety/medical concerns ? ? ?  ?   ? ?Wheelchair ? ? ? ? ?Assist Is the patient using a wheelchair?: Yes ?Type of Wheelchair: Manual ?  ? ?Wheelchair assist level: Total Assistance - Patient < 25% ?Max wheelchair distance: 150  ? ? ?Wheelchair 50 feet with 2 turns activity ? ? ? ?Assist ? ?  ?  ? ? ?Assist Level: Total Assistance - Patient < 25%  ? ?Wheelchair 150 feet activity  ? ? ? ?Assist ?   ? ? ?Assist Level: Total Assistance - Patient < 25%  ? ?Blood pressure (!) 141/58, pulse 64, temperature 97.7 ?F  (36.5 ?C), temperature source Oral, resp. rate 18, height 4\' 11"  (1.499 m), weight 53.5 kg, SpO2 97 %. ? ?Medical Problem List and Plan: ?1. 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 Continue CIR PT, OT, SLP  ?ELOS 3/8 ? ?2.  Antithrombotics: ?-DVT/anticoagulation:  Pharmaceutical: Lovenox ?            -antiplatelet therapy: DAPT X 3 months followed by Plavix alone ?3. Left shoulder pain/Pain Management: Continue Sportscreme to left shoulder and tylenol at bedtime. ?--Tylenol prn.  ?4. Mood: LCSW to follow for evaluation and support.  ?            -antipsychotic agents: N/A ?5. Neuropsych: This patient is not fully capable of making decisions on his own behalf. ?6. Skin/Wound Care: Routine pressure relief measures.  ?7. Fluids/Electrolytes/Nutrition: Monitor I/Os ?8. HTN: Monitor BP TID--continue to monitor ?  ?Vitals:  ? 07/20/21 2019 07/21/21 0552  ?BP: (!) 141/53 (!) 141/58  ?Pulse: 75 64  ?Resp: 18 18  ?Temp: 99.1 ?F (37.3 ?C) 97.7 ?F (36.5 ?C)  ?SpO2: 96% 97%  ?Controlled during the day, systolic elevation during nocturnal / early am readings , Improved  monitor on cardura 1mg  qhs- monitor for dizziness when up  ?9. CKD III: Baseline SCr 1.7 to 1.8 range-->ELevated since Oct 2022, no nephrotoxic meds identified  ?             ?BMP Latest Ref Rng & Units 07/18/2021 07/11/2021 07/08/2021  ?Glucose 70 - 99 mg/dL 101(H) 103(H) 116(H)  ?BUN 8 - 23 mg/dL 20 25(H) 38(H)  ?Creatinine 0.61 - 1.24 mg/dL 1.64(H) 1.70(H) 1.70(H)  ?BUN/Creat Ratio 10 - 24 - - -  ?Sodium 135 - 145 mmol/L 140 138 139  ?Potassium 3.5 - 5.1 mmol/L 3.8 3.8 4.0  ?Chloride 98 - 111 mmol/L 107 103 106  ?CO2 22 - 32 mmol/L 25 25 24   ?Calcium 8.9 - 10.3 mg/dL 8.9 8.6(L) 9.3  ? Creatinine 1.70 on 2/20, labs ordered for Monday ?10. Hyperactive bladder w/nocturia: Has incontinence has been an issue with urgency-->does not like depends. Nocturia decreased with merabegron pluslow dose alpha blocker  ?--Nocturia x 1 on  2/27 ?11.  RA: Has had shoulder pain on and off--uses tylenol prn. Also with left hip contracture which pt states is old xray shows mild OA - no active synovitis  ?12. Vitamin B 12 deficiency: B-12 341-->no on weekly supplement ?13. Pre-diabetes: Hgb A1C-5.8. Monitor FBS with weekly checks. ?14. Hypothyroid: Continue supplement.  ?15.  Slow transit constipation ? Improving ? 16.  Left hip contracture ROM per PT,  Xray unremarkable  ? ?LOS: ?14 days ?A FACE TO FACE EVALUATION WAS PERFORMED ? ?Luanna Salk Briar Sword ?07/21/2021, 6:53 AM  ? ? ? ?

## 2021-07-21 NOTE — Progress Notes (Signed)
Occupational Therapy Session Note ? ?Patient Details  ?Name: Saber Faughn ?MRN: [id number] ?Date of Birth: 03/22/1929 ? ?Today's Date: 07/21/2021 ?OT Individual Time: 0903-1018 ?OT Individual Time Calculation (min): 75 min  ? ? ?Short Term Goals: ?Week 1:  OT Short Term Goal 1 (Week 1): Pt will complete standing grooming task with mod A. ?OT Short Term Goal 1 - Progress (Week 1): Met ?OT Short Term Goal 2 (Week 1): Pt will don shirt with mod A. ?OT Short Term Goal 2 - Progress (Week 1): Met ?OT Short Term Goal 3 (Week 1): Pt will complete toilet transfer with max A. ?OT Short Term Goal 3 - Progress (Week 1): Met ?Week 2:  OT Short Term Goal 1 (Week 2): Pt will complete 2/3 toileting tasks with CGA. ?OT Short Term Goal 2 (Week 2): Pt will don pants with min A. ?OT Short Term Goal 3 (Week 2): Pt will maintain static sitting balance EOB for >5 min with close S. ? ?Skilled Therapeutic Interventions/Progress Updates:  ?   ?Pt received semi-reclined in bed with daugther and in person interpreter present, denies pain, agreeable to therapy. Session focus on self-care retraining, activity tolerance, transfer retraining in prep for improved ADL/IADL/func mobility performance + decreased caregiver burden. Came to sitting EOB with light mod A to lift trunk and to progress LLE off bed. Close S for seated EOB balance with B feet support. Ambulatory toilet transfer with CGA to light min A to counteract posterior bias and to guide into turn. Min A for thoroughness of posterior pericare post continent void of bowel and for pulling up pants up in back. Stood to complete hand hygiene with CGA and pt able to use LUE to retrieve soap and remove paper towels.  ? ?Bathed UB with S while seated at sink, able to utilize LH sponge to reach back. Daughter facilitiated donning overhead shirt with overall mod A to thread head and LUE. Pt able to earlier doff shirt with only S and cues to pull overhead this date, improvement from previous sessions.   ? ?Total A w/c transport to and from ADL apartment. Pt able to complete simulated walk-in shower transfer x2 with 3 inch lip + shower chair with overall min A to control descent and for steadying assist. Good technique of posterior method and daughter present to confirm bathroom set-up. Has all DME, but will need shower chair. Additionally, completed sofa and recliner transfer with heavy min A to power up from soft/low surface.  ? ? ?Pt left seated in recliner with safety belt alarm engaged and daughter/interpreter present call bell in reach, and all immediate needs met.  ? ?Therapy Documentation ?Precautions:  ?Precautions ?Precautions: Fall ?Precaution Comments: strong posterior bias ?Restrictions ?Weight Bearing Restrictions: No ? ?Pain: denies ?  ?ADL: See Care Tool for more details. ? ?Therapy/Group: Individual Therapy ? ? A  MS, OTR/L ? ?07/21/2021, 6:53 AM ?

## 2021-07-21 NOTE — Progress Notes (Signed)
Physical Therapy Session Note ? ?Patient Details  ?Name: [name] ?MRN: [id number] ?Date of Birth: Jan 06, 1929 ? ?Today's Date: 07/21/2021 ?PT Individual Time: 0277-4128 ?PT Individual Time Calculation (min): 55 min  ? ?Short Term Goals: ?Week 2:  PT Short Term Goal 1 (Week 2): Pt will transfer to and from [facility] with CGA consistently ?PT Short Term Goal 2 (Week 2): Pt will perform Berg balance assessment ?PT Short Term Goal 3 (Week 2): Pt will ambulate 140ft with CGA consistently ? ?Skilled Therapeutic Interventions/Progress Updates:  ?  Pt received sitting in recliner having finished eating breakfast with his daughter present and pt agreeable to therapy session. Primary therapy team and pt's daughter requesting for pt to trial a wheelchair with lower floor-to-seat height to allow pt improved ability to reach floor with his feet in order to scoot in the chair.  ? ?R stand pivot recliner>w/c using RW with light min assist for rising to stand and balance while turning - throughout session, pt requires cuing to scoot hips towards front of seat prior to initiating coming to stand as well as increasing anterior trunk lean/weight shift when initiating transfer - both in order to decrease posterior lean while rising. ? ?Therapist retrieved pt an ultra-hemi-height wheelchair. Stand pivot using RW as just described to the lower wheelchair. Pt demos ability to achieve feet flat on floor while sitting in w/c this height allowing him to scoot in the chair more independently; however, due to this lower height it requires increased effort to power up into standing with pt able to complete it successfully but pt requesting to use the wheelchair he had before. ? ?Gait training ~78ft using RW with CGA for steadying - pt demos improved L LE alignment (decreased hip external rotation) achieving reciprocal stepping pattern despite decreased L LE foot clearance and slow gait speed. Pt's daughter requests pt continue to use RW for all gait  training to reinforce education that he must use this at D/C.  ? ?Therapist donned 2.5lb weight on L LE. ? ?Stair navigation training ascending/descending 4 steps (6" height) x2 using B HR support with mod assist for balance especially on descent due to pt remaining in a consistent posterior lean - cuing for reciprocal pattern targeting L LE NMR - pt able to lift L LE and place on next step without assist.  ? ?Dynamic stepping challenge of R/L lateral side stepping and forward/backwards stepping over hockey stick while still using 2.5lb ankle weight on LLE - L HHA and light mod assist for balance throughout - continues to have impaired L lateral step length and foot clearance frequently kicking the stick and not stepping over large enough to leave room for R foot - has increased difficulty stepping backwards over the hockey stick requiring cuing and facilitation for weight shifting - cuing throughout for increased L LE step lengths. ? ?At end of session, pt left seated in w/c with his daughter present and NT arriving to assume care of patient.  ? ?Therapy Documentation ?Precautions:  ?Precautions ?Precautions: Fall ?Precaution Comments: strong posterior bias ?Restrictions ?Weight Bearing Restrictions: No ? ? ?Pain: ?No reports of pain throughout session. ? ? ?Therapy/Group: Individual Therapy ? ?Tawana Scale , PT, DPT, NCS, CSRS ? ?07/21/2021, 12:14 PM  ?

## 2021-07-22 NOTE — Progress Notes (Signed)
Physical Therapy Weekly Progress Note ? ?Patient Details  ?Name: [name] ?MRN: [id number] ?Date of Birth: 1928/09/09 ? ?Beginning of progress report period: July 16, 2021 ?End of progress report period: July 22, 2021 ? ?Today's Date: 07/22/2021 ?PT Individual Time: 1031-1130 ?PT Individual Time Calculation (min): 59 min  ? ?Patient has met 1 of 3 short term goals.  Pt making appropriate progress towards goals and is on track to meet LTG. He has participated well in all therapy sessions. . He completes bed mobility with MinA/ CGA, sit<>stand transfers with intermittent MinA up to supervision, and stand<>pivot transfers with CGA/ supervision using RW. He's ambulating >200 ft with CGA and RW and has shown ability to navigate at least eight 6-inch steps with CGA and 2 hand rails. He continues to be primarily limited by intermittent posterior bias instance which is affecting overall  standing balance, LUE and LLE weakness, and minor gait impairments.  Family education has been informally held with dtr during therapy sessions when she is present.  ? ?Patient continues to demonstrate the following deficits muscle weakness, decreased cardiorespiratoy endurance, unbalanced muscle activation, decreased coordination, and decreased motor planning, decreased attention to left, decreased initiation, decreased attention, and decreased safety awareness, and decreased standing balance and decreased balance strategies and therefore will continue to benefit from skilled PT intervention to increase functional independence with mobility. ? ?Patient progressing toward long term goals..  Continue plan of care. ? ?PT Short Term Goals ?Week 2:  PT Short Term Goal 1 (Week 2): Pt will transfer to and from [facility] with CGA consistently ?PT Short Term Goal 2 (Week 2): Pt will perform Berg balance assessment ?PT Short Term Goal 3 (Week 2): Pt will ambulate 166f with CGA consistently ?Week 3:    ? ?Skilled Therapeutic Interventions/Progress  Updates:  ?Patient seated upright in w/c on entrance to room. Patient alert and agreeable to PT session.  ? ?Patient with no pain complaint throughout session. ? ?Therapeutic Activity: ?Transfers: Patient performed sit<>stand and stand pivot transfers throughout session with CGA initially and improving to supervision with split hand technique. Provided verbal cues for hand placement and forward lean. ? ?Gait Training:  ?Patient ambulated >200 feet using RW with CGA/ intermittent close supervision. Demonstrated intermittent ER of LLE and decreased floor clearance with visual distraction. Provided vc/ tc for increased step height/ length, and heel strike with LLE advancement. ? ?Neuromuscular Re-ed: ?NMR facilitated during session with focus on standing balance with focus on proactive and reactive balance. Pt guided in bean bag toss to target placed 10 feet away. Is instructed to grasp bean bag with LUE and pass to RUE to toss. Is able to perform 12 throws x2 bouts. No LOB throughout. On completion, pt provided with reacher and instructed on use in order to grab bean bags from floor and return to therapist. Pt picks this up quickly and enjoys collecting bags with reacher. Dtr to purchase reacher for pt.  NMR performed for improvements in motor control and coordination, balance, sequencing, judgement, and self confidence/ efficacy in performing all aspects of mobility at highest level of independence.  ? ?Patient seated  in recliner in room at end of session with brakes locked, belt alarm set, and all needs within reach. ?   ? ?Therapy Documentation ?Precautions:  ?Precautions ?Precautions: Fall ?Precaution Comments: strong posterior bias ?Restrictions ?Weight Bearing Restrictions: No ?General: ?  ? ?Pain: ?No pain complaint this session.  ? ?Therapy/Group: Individual Therapy ? ?JAlger SimonsPT, DPT ?07/22/2021, 5:59 PM  ?

## 2021-07-22 NOTE — Progress Notes (Signed)
Occupational Therapy Weekly Progress Note ? ?Patient Details  ?Name: [name] ?MRN: [id number] ?Date of Birth: July 19, 1928 ? ?Beginning of progress report period: July 16, 2021 ?End of progress report period: July 22, 2021 ? ?Today's Date: 07/22/2021 ?OT Individual Time: [phone] ?OT Individual Time Calculation (min): 48 min  ? ? ?Patient has met 2 of 3 short term goals.  Pt has made steady progress this week towards LTG. Pt has demonstrated improved midline orientation, functional use of LUE, ability to compensate for deficits using AE, dynamic and static sitting balance to presently complete UBD with T shirts at min A level, LBD at mod A, bathing at close S level while seated, and ambulatory bathroom transfers with the RW and CGA to min A. Pt cont to be primarily limited by intermittent fluctuations in postural control / posterior bias + mild LUE HP. LUE and hand currently assessed at Brummstrom level IV and V respectively. Anticipate 24/7 S and CGA to min physical assist required upon DC. ? ? ?Patient continues to demonstrate the following deficits: muscle weakness, decreased cardiorespiratoy endurance, impaired timing and sequencing, unbalanced muscle activation, motor apraxia, ataxia, decreased coordination, and decreased motor planning, decreased attention to left and decreased motor planning, decreased awareness, decreased problem solving, and decreased safety awareness, and decreased sitting balance, decreased standing balance, decreased postural control, hemiplegia, and decreased balance strategies and therefore will continue to benefit from skilled OT intervention to enhance overall performance with BADL and Reduce care partner burden. ? ?Patient progressing toward long term goals..  Continue plan of care. ? ?OT Short Term Goals ?Week 1:  OT Short Term Goal 1 (Week 1): Pt will complete standing grooming task with mod A. ?OT Short Term Goal 1 - Progress (Week 1): Met ?OT Short Term Goal 2 (Week 1): Pt  will don shirt with mod A. ?OT Short Term Goal 2 - Progress (Week 1): Met ?OT Short Term Goal 3 (Week 1): Pt will complete toilet transfer with max A. ?OT Short Term Goal 3 - Progress (Week 1): Met ?Week 2:  OT Short Term Goal 1 (Week 2): Pt will complete 2/3 toileting tasks with CGA. ?OT Short Term Goal 1 - Progress (Week 2): Met ?OT Short Term Goal 2 (Week 2): Pt will don pants with min A. ?OT Short Term Goal 2 - Progress (Week 2): Not met ?OT Short Term Goal 3 (Week 2): Pt will maintain static sitting balance EOB for >5 min with close S. ?OT Short Term Goal 3 - Progress (Week 2): Met ?Week 3:  OT Short Term Goal 1 (Week 3): STG = LTG 2/2 ELOS ? ?Skilled Therapeutic Interventions/Progress Updates:  ?   ?Pt received semi-reclined in bed with interpreter present, denies pain, agreeable to therapy. Session focus on self-care retraining, activity tolerance, transfer retraining in prep for improved ADL/IADL/func mobility performance + decreased caregiver burden. Came to sitting EOB with only CGA this date, improved from previous sessions. Min A to power up into standing due to posterior bias > ambulated to TTB with intermittent min A to CGA. Pt with incontinent episode of bladder en route to shower. Doffed shirt with min A to unbutton top button. Bathed full-body with extended time and close S with use of LH sponge. Donned button up shirt with mod A to thread BUE, pt able to recall how to use button hook to button up shirt with only min A to button top button. Donned brief/pants with overall mod A to thread RLE and to pull up  in back/ for balance. May benefit from reacher to aid in thread RLE. ? ?Pt left seated in w/c with daughter/interpreter present with safety belt alarm engaged, call bell in reach, and all immediate needs met.  ? ?Therapy Documentation ?Precautions:  ?Precautions ?Precautions: Fall ?Precaution Comments: strong posterior bias ?Restrictions ?Weight Bearing Restrictions: No ? ?Pain: denies ?  ?ADL:  See Care Tool for more details. ? ? ?Therapy/Group: Individual Therapy ? ?Volanda Napoleon MS, OTR/L ? ?07/22/2021, 6:51 AM  ?

## 2021-07-22 NOTE — Progress Notes (Addendum)
Speech Language Pathology Weekly Progress and Session Note ? ?Patient Details  ?Name: [name] ?MRN: [id number] ?Date of Birth: Oct 17, 1928 ? ?Beginning of progress report period: July 15, 2021 ?End of progress report period: July 22, 2021 ? ?Today's Date: 07/22/2021 ?SLP Individual Time: 9323-5573 ?SLP Individual Time Calculation (min): 45 min ? ?Short Term Goals: ?Week 2: SLP Short Term Goal 1 (Week 2): Patient will be oriented x4 with mod I for use of external aid ?SLP Short Term Goal 1 - Progress (Week 2): Met ?SLP Short Term Goal 2 (Week 2): Patient will demonstrate selective attention within a mildly distracting environment with sup-to-min A verbal cues for redirection ?SLP Short Term Goal 2 - Progress (Week 2): Met ?SLP Short Term Goal 3 (Week 2): Patient will demonstrate anticipatory awareness by identifying 3 tasks he can safely participate in at home vs. unsafe tasks with min A verbal cues ?SLP Short Term Goal 3 - Progress (Week 2): Met ?SLP Short Term Goal 4 (Week 2): Patient will demonstrate appropriate reasoning to functional scenarios with sup A verbal cues ?SLP Short Term Goal 4 - Progress (Week 2): Met ? ?New Short Term Goals: ?Week 3: SLP Short Term Goal 1 (Week 3): STG=LTG due to ELOS ? ?Weekly Progress Updates: Patient continues to make excellent progress and has met 4 of 4 STGs this reporting period. Currently, patient requires overall sup-to-min A for selective attention, basic problem solving, safety awareness, and reasoning. Pt is consistently mod I to orient x4 with use of external aids. Patient remains highly motivated to participate and receives consistent support from daughter. Patient and family education is ongoing. Patient would benefit from continued skilled SLP intervention to maximize cognitive functioning and overall functional independence prior to discharge. Treatment frequency reduced to 1-3x/week to allow increased emphasis on PT/OT per daughter request, as well as suspect pt  is nearing baseline from a cognitive perspective. ? ?Intensity: Minumum of 1-2 x/day, 30 to 90 minutes ?Frequency: 1 to 3 out of 7 days ?Duration/Length of Stay: 3/10 ?Treatment/Interventions: Cueing hierarchy;Cognitive remediation/compensation;Internal/external aids;Functional tasks;Patient/family education;Therapeutic Activities ? ?Daily Session ?Skilled Therapeutic Interventions: Skilled ST treatment focused on cognitive goals. SLP facilitated session by providing min-to-mod A verbal cues for recalling 5 items to locate in hospital gift shop, sup A cues for selective attention within a moderately distracting environment. Pt searched throughout gift shop requiring min A to stand and CGA for ambulation using RW. Pt benefited from category cues, visual cues, and occasional field of choices to locate items. Pt enjoyed looking at and discussing the uses of various items. Pt was returned to his room via [facility] and transferred to recliner with min A cues to stand and CGA to sit using RW. Patient was left in _ with alarm activated and immediate needs within reach at end of session. Continue per current plan of care.     ?General  ?  ?Pain ?  ? ?Therapy/Group: Individual Therapy ? ?[name] ?07/22/2021, 4:37 PM ? ? ? ? ? ? ?

## 2021-07-22 NOTE — Progress Notes (Signed)
?                                                       PROGRESS NOTE ? ? ?Subjective/Complaints: ? ?Appreciate PT note , slept well reports good appetite , last BM 3/2 ? ?ROS: Denies CP, SOB, N/V/D ? ?Objective: ?  ?No results found. ?No results for input(s): WBC, HGB, HCT, PLT in the last 72 hours. ? ?No results for input(s): NA, K, CL, CO2, GLUCOSE, BUN, CREATININE, CALCIUM in the last 72 hours. ? ? ?Intake/Output Summary (Last 24 hours) at 07/22/2021 0743 ?Last data filed at 07/22/2021 [phone] ?Gross per 24 hour  ?Intake 720 ml  ?Output 560 ml  ?Net 160 ml  ? ?  ? ?  ? ?Physical Exam: ?Vital Signs ?Blood pressure (!) 142/65, pulse 64, temperature 97.6 ?F (36.4 ?C), temperature source Oral, resp. rate 18, height 4\' 11"  (1.499 m), weight 53.5 kg, SpO2 95 %. ? ?General: No acute distress ?Mood and affect are appropriate ?Heart: Regular rate and rhythm no rubs murmurs or extra sounds ?Lungs: Clear to auscultation, breathing unlabored, no rales or wheezes ?Abdomen: Positive bowel sounds, soft nontender to palpation, nondistended ?Extremities: No clubbing, cyanosis, or edema ?Skin: No evidence of breakdown, no evidence of rash ?Neuro: Alert ?Motor: RUE/RLE: 5/5 proximal distal ?LUE/LLE: 4 -/5 proximal distal with apraxia  ? ?Assessment/Plan: ?1. Functional deficits which require 3+ hours per day of interdisciplinary therapy in a comprehensive inpatient rehab setting. ?Physiatrist is providing close team supervision and 24 hour management of active medical problems listed below. ?Physiatrist and rehab team continue to assess barriers to discharge/monitor patient progress toward functional and medical goals ? ?Care Tool: ? ?Bathing ?   ?Body parts bathed by patient: Right arm, Left arm, Chest, Abdomen, Face  ? Body parts bathed by helper: Buttocks, Right lower leg, Left lower leg, Right upper leg, Left upper leg ?  ?  ?Bathing assist Assist Level: Supervision/Verbal cueing ?  ?  ?Upper Body Dressing/Undressing ?Upper body  dressing   ?What is the patient wearing?: Button up shirt ?   ?Upper body assist Assist Level: Maximal Assistance - Patient 25 - 49% ?   ?Lower Body Dressing/Undressing ?Lower body dressing ? ? ?   ?What is the patient wearing?: Pants ? ?  ? ?Lower body assist Assist for lower body dressing: Minimal Assistance - Patient > 75% ?   ? ?Toileting ?Toileting Toileting Activity did not occur (Probation officer and hygiene only): N/A (no void or bm)  ?Toileting assist Assist for toileting: Minimal Assistance - Patient > 75% ?  ?  ?Transfers ?Chair/bed transfer ? ?Transfers assist ?   ? ?Chair/bed transfer assist level: Minimal Assistance - Patient > 75% ?Chair/bed transfer assistive device: Armrests, Walker ?  ?Locomotion ?Ambulation ? ? ?Ambulation assist ? ? Ambulation activity did not occur: Safety/medical concerns ? ?Assist level: Minimal Assistance - Patient > 75% ?Assistive device: Walker-rolling ?Max distance: 23ft  ? ?Walk 10 feet activity ? ? ?Assist ? Walk 10 feet activity did not occur: Safety/medical concerns ? ?Assist level: Contact Guard/Touching assist ?Assistive device: Walker-rolling  ? ?Walk 50 feet activity ? ? ?Assist Walk 50 feet with 2 turns activity did not occur: Safety/medical concerns ? ?  ?   ? ? ?Walk 150 feet activity ? ? ?Assist Walk 150 feet activity  did not occur: Safety/medical concerns ? ?  ?  ?  ? ?Walk 10 feet on uneven surface  ?activity ? ? ?Assist Walk 10 feet on uneven surfaces activity did not occur: Safety/medical concerns ? ? ?  ?   ? ?Wheelchair ? ? ? ? ?Assist Is the patient using a wheelchair?: Yes ?Type of Wheelchair: Manual ?  ? ?Wheelchair assist level: Total Assistance - Patient < 25% ?Max wheelchair distance: 150  ? ? ?Wheelchair 50 feet with 2 turns activity ? ? ? ?Assist ? ?  ?  ? ? ?Assist Level: Total Assistance - Patient < 25%  ? ?Wheelchair 150 feet activity  ? ? ? ?Assist ?   ? ? ?Assist Level: Total Assistance - Patient < 25%  ? ?Blood pressure (!) 142/65, pulse  64, temperature 97.6 ?F (36.4 ?C), temperature source Oral, resp. rate 18, height 4\' 11"  (1.499 m), weight 53.5 kg, SpO2 95 %. ? ?Medical Problem List and Plan: ?1. 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 Continue CIR PT, OT, SLP  ?ELOS 3/8 ? ?2.  Antithrombotics: ?-DVT/anticoagulation:  Pharmaceutical: Lovenox ?            -antiplatelet therapy: DAPT X 3 months followed by Plavix alone ?3. Left shoulder pain/Pain Management: Continue Sportscreme to left shoulder and tylenol at bedtime. ?--Tylenol prn.  ?4. Mood: LCSW to follow for evaluation and support.  ?            -antipsychotic agents: N/A ?5. Neuropsych: This patient is not fully capable of making decisions on his own behalf. ?6. Skin/Wound Care: Routine pressure relief measures.  ?7. Fluids/Electrolytes/Nutrition: Monitor I/Os ?8. HTN: Monitor BP TID--continue to monitor ?  ?Vitals:  ? 07/21/21 1935 07/22/21 0408  ?BP: 114/61 (!) 142/65  ?Pulse: 78 64  ?Resp: 20 18  ?Temp: (!) 97.5 ?F (36.4 ?C) 97.6 ?F (36.4 ?C)  ?SpO2: 95% 95%  ?Controlled during the day, systolic elevation during nocturnal / early am readings , Improved  monitor on cardura 1mg  qhs- monitor for dizziness when up  ?9. CKD III: Baseline SCr 1.7 to 1.8 range-->ELevated since Oct 2022, no nephrotoxic meds identified  ?             ?BMP Latest Ref Rng & Units 07/18/2021 07/11/2021 07/08/2021  ?Glucose 70 - 99 mg/dL 101(H) 103(H) 116(H)  ?BUN 8 - 23 mg/dL 20 25(H) 38(H)  ?Creatinine 0.61 - 1.24 mg/dL 1.64(H) 1.70(H) 1.70(H)  ?BUN/Creat Ratio 10 - 24 - - -  ?Sodium 135 - 145 mmol/L 140 138 139  ?Potassium 3.5 - 5.1 mmol/L 3.8 3.8 4.0  ?Chloride 98 - 111 mmol/L 107 103 106  ?CO2 22 - 32 mmol/L 25 25 24   ?Calcium 8.9 - 10.3 mg/dL 8.9 8.6(L) 9.3  ? Creatinine 1.70 on 2/20, labs ordered for Monday ?10. Hyperactive bladder w/nocturia: Has incontinence has been an issue with urgency-->does not like depends. Nocturia decreased with merabegron plus low dose alpha blocker   ? ?11.  RA: Has had shoulder pain on and off--uses tylenol prn. Also with left hip contracture which pt states is old xray shows mild OA - no active synovitis  ?12. Vitamin B 12 deficiency: B-12 341-->no on weekly supplement ?13. Pre-diabetes: Hgb A1C-5.8. Monitor FBS with weekly checks. ?14. Hypothyroid: Continue supplement.  ?15.  Slow transit constipation ? Improving ? 16.  Left hip contracture ROM per PT, Xray unremarkable  ? ?LOS: ?15 days ?A FACE TO FACE EVALUATION WAS PERFORMED ? ?  Luanna Salk Karmon Andis ?07/22/2021, 7:43 AM  ? ? ? ?

## 2021-07-23 DIAGNOSIS — I6330 Cerebral infarction due to thrombosis of unspecified cerebral artery: Secondary | ICD-10-CM

## 2021-07-23 NOTE — Progress Notes (Signed)
Occupational Therapy Session Note ? ?Patient Details  ?Name: [name] ?MRN: [id number] ?Date of Birth: [birth year] ? ?Today's Date: 07/24/2021 ?OT Individual Time: 7902-4097 ?OT Individual Time Calculation (min): 45 min  ? ? ?Skilled Therapeutic Interventions/Progress Updates:  ?  Pt greeted in bed with no c/o pain. Family at bedside, asking that OT assist pt in LB dressing and also reviewing couch transfers in the ADL apartment today. Mod A for supine<sit with HOB raised, pt using the bedrail. Pt standing with CGA using RW, Min balance assist while he lowered clothing due to posterior bias. Pt completed perihygiene and then sat back down. OT assisted with donning new brief and then pt donned pants at sit<stand level with CGA overall. Short distance ambulatory transfer to w/c completed using RW with CGA. Pt was then escorted via w/c to the therapy apartment. Reviewed with daughter how to assist pt with transfer to the couch using device. Education emphasis placed on cuing pt with "nose over toes" and "keeping feet flat" during power ups and in stance. Discussed sitting beside armrest of couch at home and also using tennis balls on walker legs to increase ease of maneuverability in home. Daughter had practice cuing pt during sit<stands and ambulating with him in the hallway, daughter providing CGA while pt used his device. OT followed behind with w/c. Pt able to reach the supply closet and then needed to sit. Pt escorted remainder of way back to the room. Left via direct PT handoff.  ? ?Interpretor present during tx ? ?Therapy Documentation ?Precautions:  ?Precautions ?Precautions: Fall ?Precaution Comments: strong posterior bias ?Restrictions ?Weight Bearing Restrictions: No ?ADL: ?ADL ?Eating: Set up ?Where Assessed-Eating: Bed level ?Grooming: Supervision/safety ?Where Assessed-Grooming: Sitting at sink ?Upper Body Bathing: Supervision/safety ?Where Assessed-Upper Body Bathing: Sitting at sink ?Lower Body Bathing:  Moderate assistance ?Where Assessed-Lower Body Bathing: Standing at sink ?Upper Body Dressing: Maximal assistance ?Where Assessed-Upper Body Dressing: Sitting at sink ?Lower Body Dressing: Maximal assistance ?Where Assessed-Lower Body Dressing: Sitting at sink ?Toileting: Not assessed ?Toilet Transfer: Not assessed ?Tub/Shower Transfer: Not assessed ?Walk-In Shower Transfer: Not assessed ? ? ?Therapy/Group: Individual Therapy ? ?Carleen Rhue A Hurschel Paynter ?07/24/2021, 12:41 PM ?

## 2021-07-23 NOTE — Progress Notes (Signed)
Occupational Therapy Session Note ? ?Patient Details  ?Name: [name] ?MRN: [id number] ?Date of Birth: 08-11-1928 ? ?Today's Date: 07/23/2021 ?OT Individual Time: [phone] ?OT Individual Time Calculation (min): 35 min  ? ? ?Short Term Goals: ?Week 1:  OT Short Term Goal 1 (Week 1): Pt will complete standing grooming task with mod A. ?OT Short Term Goal 1 - Progress (Week 1): Met ?OT Short Term Goal 2 (Week 1): Pt will don shirt with mod A. ?OT Short Term Goal 2 - Progress (Week 1): Met ?OT Short Term Goal 3 (Week 1): Pt will complete toilet transfer with max A. ?OT Short Term Goal 3 - Progress (Week 1): Met ? ?Skilled Therapeutic Interventions/Progress Updates: Interpreterpresent for session.  Dtr present for session except for ADL portion (stepped out to give dad privacy).   His wife was present for education and due t communication barrier and wife falling asleep, she viewed session intermittently.  Interpreter stepped out during ADL portion. ?  Patient completed therapy as follows: ?Bed mobility = extra time and patient able to regain midline posture when allowed time to self correct ?Bed to toilet transfer via RW and 3:1 over toilet = extra time and CGA ?Toileting =Min A with slight R lateral leans during dynamic standing to pericleanse.  Unable to tie draw string pants due to unable to maintain dynamic standing for extended time to use bilateral UEs ?Peribathing at toilet, sit to stand= extra time CGA - Min A to maintain dynamic balance during cleansing and drying tasks. ?UB Bathing and dressing = Min A, especially to manage pulling pullover shirt down over head. ?LB drssing = mod A as patient fatigued and required A to maintain dynamic sitting and standing balance to reach low and to attempt to tie draw string pants. ? ?Patient left seated in w/c and handed off to dtr who was waiting to hand patient off to PT at end of the session. ? ?Continue POC ? ?Therapy Documentation ?Precautions:   ?Precautions ?Precautions: Fall ?Precaution Comments: strong posterior bias ?Restrictions ?Weight Bearing Restrictions: No ?General: ?  ? ? ?Pain: ?Pain Assessment ?Pain Scale: 0-10 ?Pain Score: 0-No pain ? ? ? ? ?Therapy/Group: Individual Therapy ? ?Herschell Dimes ?07/23/2021, 5:04 PM ?

## 2021-07-23 NOTE — Progress Notes (Signed)
?                                                       PROGRESS NOTE ? ? ?Subjective/Complaints: ?Pt up in bed. Finishing breakfast. Feeling well. Wife at bedside ? ?ROS: limited due to language/communication  ? ?Objective: ?  ?No results found. ?No results for input(s): WBC, HGB, HCT, PLT in the last 72 hours. ? ?No results for input(s): NA, K, CL, CO2, GLUCOSE, BUN, CREATININE, CALCIUM in the last 72 hours. ? ? ?Intake/Output Summary (Last 24 hours) at 07/23/2021 5053 ?Last data filed at 07/23/2021 0737 ?Gross per 24 hour  ?Intake 357 ml  ?Output 200 ml  ?Net 157 ml  ?  ? ?  ? ?Physical Exam: ?Vital Signs ?Blood pressure 139/61, pulse 62, temperature 98 ?F (36.7 ?C), resp. rate 14, height '4\' 11"'$  (1.499 m), weight 53.5 kg, SpO2 96 %. ? ?Constitutional: No distress . Vital signs reviewed. ?HEENT: NCAT, EOMI, oral membranes moist ?Neck: supple ?Cardiovascular: RRR without murmur. No JVD    ?Respiratory/Chest: CTA Bilaterally without wheezes or rales. Normal effort    ?GI/Abdomen: BS +, non-tender, non-distended ?Ext: no clubbing, cyanosis, or edema ?Psych: pleasant and cooperative  ?Skin: No evidence of breakdown, no evidence of rash ?Neuro: Alert, demonstrates reasonable comprehension ?Motor: RUE/RLE: 5/5 proximal distal ?LUE/LLE: 4 -/5 proximal distal with apraxia  ? ?Assessment/Plan: ?1. Functional deficits which require 3+ hours per day of interdisciplinary therapy in a comprehensive inpatient rehab setting. ?Physiatrist is providing close team supervision and 24 hour management of active medical problems listed below. ?Physiatrist and rehab team continue to assess barriers to discharge/monitor patient progress toward functional and medical goals ? ?Care Tool: ? ?Bathing ?   ?Body parts bathed by patient: Right arm, Left arm, Chest, Abdomen, Face  ? Body parts bathed by helper: Buttocks, Right lower leg, Left lower leg, Right upper leg, Left upper leg ?  ?  ?Bathing assist Assist Level: Supervision/Verbal cueing ?   ?  ?Upper Body Dressing/Undressing ?Upper body dressing   ?What is the patient wearing?: Button up shirt ?   ?Upper body assist Assist Level: Maximal Assistance - Patient 25 - 49% ?   ?Lower Body Dressing/Undressing ?Lower body dressing ? ? ?   ?What is the patient wearing?: Pants ? ?  ? ?Lower body assist Assist for lower body dressing: Minimal Assistance - Patient > 75% ?   ? ?Toileting ?Toileting Toileting Activity did not occur (Probation officer and hygiene only): N/A (no void or bm)  ?Toileting assist Assist for toileting: Minimal Assistance - Patient > 75% ?  ?  ?Transfers ?Chair/bed transfer ? ?Transfers assist ?   ? ?Chair/bed transfer assist level: Minimal Assistance - Patient > 75% ?Chair/bed transfer assistive device: Armrests, Walker ?  ?Locomotion ?Ambulation ? ? ?Ambulation assist ? ? Ambulation activity did not occur: Safety/medical concerns ? ?Assist level: Minimal Assistance - Patient > 75% ?Assistive device: Walker-rolling ?Max distance: 45f  ? ?Walk 10 feet activity ? ? ?Assist ? Walk 10 feet activity did not occur: Safety/medical concerns ? ?Assist level: Contact Guard/Touching assist ?Assistive device: Walker-rolling  ? ?Walk 50 feet activity ? ? ?Assist Walk 50 feet with 2 turns activity did not occur: Safety/medical concerns ? ?  ?   ? ? ?Walk 150 feet activity ? ? ?Assist Walk  150 feet activity did not occur: Safety/medical concerns ? ?  ?  ?  ? ?Walk 10 feet on uneven surface  ?activity ? ? ?Assist Walk 10 feet on uneven surfaces activity did not occur: Safety/medical concerns ? ? ?  ?   ? ?Wheelchair ? ? ? ? ?Assist Is the patient using a wheelchair?: Yes ?Type of Wheelchair: Manual ?  ? ?Wheelchair assist level: Total Assistance - Patient < 25% ?Max wheelchair distance: 150  ? ? ?Wheelchair 50 feet with 2 turns activity ? ? ? ?Assist ? ?  ?  ? ? ?Assist Level: Total Assistance - Patient < 25%  ? ?Wheelchair 150 feet activity  ? ? ? ?Assist ?   ? ? ?Assist Level: Total Assistance -  Patient < 25%  ? ?Blood pressure 139/61, pulse 62, temperature 98 ?F (36.7 ?C), resp. rate 14, height '4\' 11"'$  (1.499 m), weight 53.5 kg, SpO2 96 %. ? ?Medical Problem List and Plan: ?1. 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 -Continue CIR therapies including PT, OT, and SLP  ?ELOS 3/8 ? ?2.  Antithrombotics: ?-DVT/anticoagulation:  Pharmaceutical: Lovenox ?            -antiplatelet therapy: DAPT X 3 months followed by Plavix alone ?3. Left shoulder pain/Pain Management: Continue Sportscreme to left shoulder and tylenol at bedtime. ?--Tylenol prn.  ?-pt denies pain today ?4. Mood: LCSW to follow for evaluation and support.  ?            -antipsychotic agents: N/A ?5. Neuropsych: This patient is not fully capable of making decisions on his own behalf. ?6. Skin/Wound Care: Routine pressure relief measures.  ?7. Fluids/Electrolytes/Nutrition: Monitor I/Os ?8. HTN: Monitor BP TID--continue to monitor ?  ?Vitals:  ? 07/22/21 1928 07/23/21 0420  ?BP: (!) 126/46 139/61  ?Pulse: 77 62  ?Resp: 14 14  ?Temp: 98.5 ?F (36.9 ?C) 98 ?F (36.7 ?C)  ?SpO2: 95% 96%  ?  3/4 controlled.  on cardura '1mg'$  qhs   ?9. CKD III: Baseline SCr 1.7 to 1.8 range-->ELevated since Oct 2022, no nephrotoxic meds identified  ?             ?BMP Latest Ref Rng & Units 07/18/2021 07/11/2021 07/08/2021  ?Glucose 70 - 99 mg/dL 101(H) 103(H) 116(H)  ?BUN 8 - 23 mg/dL 20 25(H) 38(H)  ?Creatinine 0.61 - 1.24 mg/dL 1.64(H) 1.70(H) 1.70(H)  ?BUN/Creat Ratio 10 - 24 - - -  ?Sodium 135 - 145 mmol/L 140 138 139  ?Potassium 3.5 - 5.1 mmol/L 3.8 3.8 4.0  ?Chloride 98 - 111 mmol/L 107 103 106  ?CO2 22 - 32 mmol/L '25 25 24  '$ ?Calcium 8.9 - 10.3 mg/dL 8.9 8.6(L) 9.3  ? Creatinine 1.70 on 2/20, labs ordered for Monday ?10. Hyperactive bladder w/nocturia: Has incontinence has been an issue with urgency-->does not like depends. Nocturia decreased with merabegron plus low dose alpha blocker  ? ?11.  RA: Has had shoulder pain on and off--uses  tylenol prn. Also with left hip contracture which pt states is old xray shows mild OA - no active synovitis  ?12. Vitamin B 12 deficiency: B-12 341-->no on weekly supplement ?13. Pre-diabetes: Hgb A1C-5.8. Monitor FBS with weekly checks. ?14. Hypothyroid: Continue supplement.  ?15.  Slow transit constipation ? Improving ? 16.  Left hip contracture ROM per PT, Xray unremarkable  ? ?LOS: ?16 days ?A FACE TO FACE EVALUATION WAS PERFORMED ? ?Meredith Staggers ?07/23/2021, 9:22 AM  ? ? ? ?

## 2021-07-23 NOTE — Progress Notes (Signed)
Occupational Therapy Session Note ? ?Patient Details  ?Name: [name] ?MRN: [id number] ?Date of Birth: 08/29/1928 ? ?Today's Date: 07/23/2021 ?OT Individual Time: 1520-1600 ?OT Individual Time Calculation (min): 40 min  ? ? ?Short Term Goals: ?Week 1:  OT Short Term Goal 1 (Week 1): Pt will complete standing grooming task with mod A. ?OT Short Term Goal 1 - Progress (Week 1): Met ?OT Short Term Goal 2 (Week 1): Pt will don shirt with mod A. ?OT Short Term Goal 2 - Progress (Week 1): Met ?OT Short Term Goal 3 (Week 1): Pt will complete toilet transfer with max A. ?OT Short Term Goal 3 - Progress (Week 1): Met ? ?Skilled Therapeutic Interventions/Progress Updates: focus this session= toilet transfer via RW and 3:1 = CGA; toileting = moderate assistance as patient jpresented with increased fatigue this session and slight R lateral leaning with both static and dynamic standing balance.   Not able to maintain balance and complete pericleansing and fastening of draw string pants.     ? ?Shower transfer via RW and grab bars and tub transfer bench = CGA (walk in shower in patient room; [facility]) ?Oral care standing at sink.  Patient was able to use left hand to demonstrate FM control, coordination and dexyerity to loosen paste lid and tighten, hold cup of water rinseing turn on water and use wash cloth to wring out water, wash face and dry bilateral hands at end of oral care and face washing tasks.   Patient very very thorough and washed face, behind ears, neck and brushed his dentutres in his mouth, gums, tongue and ALL oral cavities and rinsed several times.   He did not appear to be severating but rather was thorough in or grooming skills.  He was able to maintain standing balance for this entire dynamic standing at sink task.   He was able at times to stand to complete oral care with fair balance alternating and/or using bilateral hands.   This may be a rote task for this  patient and may have helped him to demonstrate CGA to very Min A for dynamic standing balance for the UE alternating and/or bilateral use. ?At end of session patient was assisted back to recliner chair (CGA to S for transfer via RW) with is legs elevated.  Call bell within reach. He demonstrated understanding of use.   His waist alarm was engaged. ? ?Continue POC ?   ? ?Therapy Documentation ?Precautions:  ?Precautions ?Precautions: Fall ?Precaution Comments: strong posterior bias ?Restrictions ?Weight Bearing Restrictions: No ?General: ?  ?Vital Signs: ? ?Pain:denied ? ?Therapy/Group: Individual Therapy ? ?Herschell Dimes ?07/23/2021, 6:38 PM ?

## 2021-07-23 NOTE — Progress Notes (Signed)
Speech Language Pathology Daily Session Note ? ?Patient Details  ?Name: [name] ?MRN: [id number] ?Date of Birth: 06-Mar-1929 ? ?Today's Date: 07/23/2021 ?SLP Individual Time: 3664-4034 ?SLP Individual Time Calculation (min): 45 min ? ?Short Term Goals: ?Week 3: SLP Short Term Goal 1 (Week 3): STG=LTG due to ELOS ? ?Skilled Therapeutic Interventions: ?Pt seen for skilled ST with focus on cognitive goals, live interpretor present throughout. Pt participating in simple card game to target simple memory, attention and problem solving goals. Pt benefiting from [place] A verbal cues for recall of novel directions and appropriate pacing in the game. Pt continues to enjoy talking about the past when he was a child/young adult and is able to sustain attention to choice topics adequately. Interpretor states the daughter has hired caregivers for pt and wife to assist with home management needs and promote safety at discharge. Pt left in recliner with interpretor and NT present. Cont ST POC.  ? ?Pain ?Pain Assessment ?Pain Scale: 0-10 ?Pain Score: 0-No pain ? ?Therapy/Group: Individual Therapy ? ?Dewaine Conger ?07/23/2021, 2:40 PM ?

## 2021-07-23 NOTE — Progress Notes (Signed)
Physical Therapy Session Note ? ?Patient Details  ?Name: [name] ?MRN: [id number] ?Date of Birth: 1929/03/06 ? ?Today's Date: 07/23/2021 ?PT Individual Time: 1000-1038 ?PT Individual Time Calculation (min): 38 min  ? ?Short Term Goals: ? ?Week 2:  PT Short Term Goal 1 (Week 2): Pt will transfer to and from [facility] with CGA consistently ?PT Short Term Goal 2 (Week 2): Pt will perform Berg balance assessment ?PT Short Term Goal 3 (Week 2): Pt will ambulate 140f with CGA consistently ?Week 3:    ? ?Skilled Therapeutic Interventions/Progress Updates:  ? ?Pt received sitting in WC and agreeable to PT. [facility]  ? ?Gait training woith RW x 1288fwith CGA-supervision assist. Dynamic gait training to step over 3 bar weights in floor with RW and min assist for RW management performed x 4 bouts with seated rest break and moderate cues for AD management and step length on the LLE. Noted to have improved step length with increased repetitions.   ? ?Dynamic balance training to force anterior weight shift: forefoot on red wedge to complete modified box and blocks x 20 BUE each with seated rest break between bouts  cues for weight bearing through the forefoot to reduce posterior bias. CGA from PT throughoout balance training.  ? ?Sit<>stand throughout session with supervision assist-CGA with at least 1 UE on RW to facilitate improved anterior weight shift and weight shift from heels to forefoot.  ? ?Patient returned to room and performed stand pivot to recliner with supervision assist and RW. Pt left sitting in recliner with call bell in reach and all needs met.    ?   ? ?Therapy Documentation ?Precautions:  ?Precautions ?Precautions: Fall ?Precaution Comments: strong posterior bias ?Restrictions ?Weight Bearing Restrictions: No ? ?  ?Pain: ?Pain Assessment ?Pain Scale: 0-10 ?Pain Score: 0-No pain ? ? ? ?Therapy/Group: Individual Therapy ? ?AuLorie Phenix3/08/2021, 10:56 AM  ?

## 2021-07-24 NOTE — Progress Notes (Signed)
Speech Language Pathology Daily Session Note ? ?Patient Details  ?Name: [name] ?MRN: [id number] ?Date of Birth: 11/19/1928 ? ?Today's Date: 07/24/2021 ?SLP Individual Time: 9201-0071 ?SLP Individual Time Calculation (min): 40 min ? ?Short Term Goals: ?Week 3: SLP Short Term Goal 1 (Week 3): STG=LTG due to ELOS ? ?Skilled Therapeutic Interventions: ? Pt was seen for skilled ST targeting cognitive goals. Pt was sitting up in wheelchair, awake, alert, and agreeable to participating in treatment upon therapist's arrival.  SLP facilitated the session with a pipe tree task to address goals for problem solving, attention, and error awareness.  Pt was able to recreate simple structures from pictures out of PVC pipe with supervision cues but he needed up to min assist for more complex structures.  Pt could recognize errors in ~75% of opportunities which improved to 100% with min cues.  Pt attended to task for its duration (~40 minutes) with no cues needed for redirection.  At the end of today's session, pt reported that he felt the pipe tree activity was very good for his mind and his body.  Pt's daughter reports that pt's tangential verbal output and "floating between the past and present" is still not back to baseline she feels he is improving.  Pt was left in wheelchair with chair alarm set and wife and interpretor at bedside.  Continue per current plan of care.   ? ?Pain ?Pain Assessment ?Pain Scale: 0-10 ?Pain Score: 0-No pain ? ?Therapy/Group: Individual Therapy ? ?Arlene Brickel, Selinda Orion ?07/24/2021, 3:33 PM ?

## 2021-07-24 NOTE — Progress Notes (Signed)
Physical Therapy Session Note ? ?Patient Details  ?Name: [name] ?MRN: [id number] ?Date of Birth: [birth year] ? ?Today's Date: 07/24/2021 ?PT Individual Time: 7829-5621 ?PT Individual Time Calculation (min): 45 min  ? ?Short Term Goals: ?Week 2:  PT Short Term Goal 1 (Week 2): Pt will transfer to and from [facility] with CGA consistently ?PT Short Term Goal 2 (Week 2): Pt will perform Berg balance assessment ?PT Short Term Goal 3 (Week 2): Pt will ambulate 134f with CGA consistently ? ?Skilled Therapeutic Interventions/Progress Updates:  ?  Pt received seated in w/c in room handed off from OT session, agreeable to PT session. No complaints of pain this date. Pt's daughter present during session and able to demonstrate safe hands-on assist with transfers and gait with RW. Updated safety plan to reflect that BInez Catalinais safe to ambulate with patient. Pt performs sit to stand with CGA and RW during session with slow, controlled movements. Ambulation up to 150 ft with RW and CGA for balance with slow cadence, flexed trunk, and decreased LLE clearance noted at times. Navigation through obstacle course weaving through cones, stepping over hockey sticks, and standing on airex with no UE support for 30-60 sec with min A, use of RW for remainder of [facility]. Pt requires cues for safe RW management when navigating hockey sticks. Sidesteps L/R 4 x 10 ft each direction with RW and CGA for balance with cues for upright posture and keeping toes pointed forwards. Pt fatigues quickly during session, requires several seated rest breaks. Education with patient and family regarding energy conservation. Use of interpreter during session. Pt left seated in recliner in room with needs in reach, quick release belt and chair alarm in place at end of session. ? ?Therapy Documentation ?Precautions:  ?Precautions ?Precautions: Fall ?Precaution Comments: strong posterior bias ?Restrictions ?Weight Bearing Restrictions:  No ? ? ? ? ? ? ?Therapy/Group: Individual Therapy ? ? ?TExcell Seltzer PT, DPT, CSRS ? ?07/24/2021, 12:18 PM  ?

## 2021-07-25 LAB — CBC
HCT: 36.4 % — ABNORMAL LOW (ref 39.0–52.0)
Hemoglobin: 11.6 g/dL — ABNORMAL LOW (ref 13.0–17.0)
MCH: 29.5 pg (ref 26.0–34.0)
MCHC: 31.9 g/dL (ref 30.0–36.0)
MCV: 92.6 fL (ref 80.0–100.0)
Platelets: 206 10*3/uL (ref 150–400)
RBC: 3.93 MIL/uL — ABNORMAL LOW (ref 4.22–5.81)
RDW: 13.5 % (ref 11.5–15.5)
WBC: 8.1 10*3/uL (ref 4.0–10.5)
nRBC: 0 % (ref 0.0–0.2)

## 2021-07-25 LAB — BASIC METABOLIC PANEL
Anion gap: 8 (ref 5–15)
BUN: 22 mg/dL (ref 8–23)
CO2: 23 mmol/L (ref 22–32)
Calcium: 8.5 mg/dL — ABNORMAL LOW (ref 8.9–10.3)
Chloride: 109 mmol/L (ref 98–111)
Creatinine, Ser: 1.78 mg/dL — ABNORMAL HIGH (ref 0.61–1.24)
GFR, Estimated: 36 mL/min — ABNORMAL LOW (ref >60)
Glucose, Bld: 119 mg/dL — ABNORMAL HIGH (ref 70–99)
Potassium: 4.1 mmol/L (ref 3.5–5.1)
Sodium: 140 mmol/L (ref 135–145)

## 2021-07-25 NOTE — Progress Notes (Signed)
?                                                       PROGRESS NOTE ? ? ?Subjective/Complaints: ? ?No issues overnite  ? ?ROS: limited due to language/communication  ? ?Objective: ?  ?No results found. ?Recent Labs  ?  07/25/21 ?1062  ?WBC 8.1  ?HGB 11.6*  ?HCT 36.4*  ?PLT 206  ? ? ?Recent Labs  ?  07/25/21 ?6948  ?NA 140  ?K 4.1  ?CL 109  ?CO2 23  ?GLUCOSE 119*  ?BUN 22  ?CREATININE 1.78*  ?CALCIUM 8.5*  ? ? ? ?Intake/Output Summary (Last 24 hours) at 07/25/2021 0816 ?Last data filed at 07/25/2021 0406 ?Gross per 24 hour  ?Intake 474 ml  ?Output 525 ml  ?Net -51 ml  ? ?  ? ?  ? ?Physical Exam: ?Vital Signs ?Blood pressure (!) 138/57, pulse 80, temperature 98.6 ?F (37 ?C), temperature source Oral, resp. rate 16, height '4\' 11"'$  (1.499 m), weight 53.5 kg, SpO2 97 %. ? ? ?General: No acute distress ?Mood and affect are appropriate ?Heart: Regular rate and rhythm no rubs murmurs or extra sounds ?Lungs: Clear to auscultation, breathing unlabored, no rales or wheezes ?Abdomen: Positive bowel sounds, soft nontender to palpation, nondistended ?Extremities: No clubbing, cyanosis, or edema ?Skin: No evidence of breakdown, no evidence of rash ? ? ?Skin: No evidence of breakdown, no evidence of rash ?Neuro: Alert, demonstrates reasonable comprehension ?Motor: RUE/RLE: 5/5 proximal distal ?LUE/LLE: 4 -/5 proximal distal with apraxia  ? ?Assessment/Plan: ?1. Functional deficits which require 3+ hours per day of interdisciplinary therapy in a comprehensive inpatient rehab setting. ?Physiatrist is providing close team supervision and 24 hour management of active medical problems listed below. ?Physiatrist and rehab team continue to assess barriers to discharge/monitor patient progress toward functional and medical goals ? ?Care Tool: ? ?Bathing ?   ?Body parts bathed by patient: Right arm, Left arm, Chest, Abdomen, Face  ? Body parts bathed by helper: Buttocks, Right lower leg, Left lower leg, Right upper leg, Left upper leg ?  ?   ?Bathing assist Assist Level: Supervision/Verbal cueing ?  ?  ?Upper Body Dressing/Undressing ?Upper body dressing   ?What is the patient wearing?: Button up shirt ?   ?Upper body assist Assist Level: Maximal Assistance - Patient 25 - 49% ?   ?Lower Body Dressing/Undressing ?Lower body dressing ? ? ?   ?What is the patient wearing?: Pants ? ?  ? ?Lower body assist Assist for lower body dressing: Minimal Assistance - Patient > 75% ?   ? ?Toileting ?Toileting Toileting Activity did not occur (Probation officer and hygiene only): N/A (no void or bm)  ?Toileting assist Assist for toileting: Minimal Assistance - Patient > 75% ?  ?  ?Transfers ?Chair/bed transfer ? ?Transfers assist ?   ? ?Chair/bed transfer assist level: Minimal Assistance - Patient > 75% ?Chair/bed transfer assistive device: Armrests, Walker ?  ?Locomotion ?Ambulation ? ? ?Ambulation assist ? ? Ambulation activity did not occur: Safety/medical concerns ? ?Assist level: Minimal Assistance - Patient > 75% ?Assistive device: Walker-rolling ?Max distance: 19f  ? ?Walk 10 feet activity ? ? ?Assist ? Walk 10 feet activity did not occur: Safety/medical concerns ? ?Assist level: Contact Guard/Touching assist ?Assistive device: Walker-rolling  ? ?Walk 50 feet activity ? ? ?Assist  Walk 50 feet with 2 turns activity did not occur: Safety/medical concerns ? ?  ?   ? ? ?Walk 150 feet activity ? ? ?Assist Walk 150 feet activity did not occur: Safety/medical concerns ? ?  ?  ?  ? ?Walk 10 feet on uneven surface  ?activity ? ? ?Assist Walk 10 feet on uneven surfaces activity did not occur: Safety/medical concerns ? ? ?  ?   ? ?Wheelchair ? ? ? ? ?Assist Is the patient using a wheelchair?: Yes ?Type of Wheelchair: Manual ?  ? ?Wheelchair assist level: Total Assistance - Patient < 25% ?Max wheelchair distance: 150  ? ? ?Wheelchair 50 feet with 2 turns activity ? ? ? ?Assist ? ?  ?  ? ? ?Assist Level: Total Assistance - Patient < 25%  ? ?Wheelchair 150 feet activity   ? ? ? ?Assist ?   ? ? ?Assist Level: Total Assistance - Patient < 25%  ? ?Blood pressure (!) 138/57, pulse 80, temperature 98.6 ?F (37 ?C), temperature source Oral, resp. rate 16, height '4\' 11"'$  (1.499 m), weight 53.5 kg, SpO2 97 %. ? ?Medical Problem List and Plan: ?1. 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 -Continue CIR therapies including PT, OT, and SLP  ?ELOS 3/8 ? ?2.  Antithrombotics: ?-DVT/anticoagulation:  Pharmaceutical: Lovenox ?            -antiplatelet therapy: DAPT X 3 months followed by Plavix alone ?3. Left shoulder pain/Pain Management: Continue Sportscreme to left shoulder and tylenol at bedtime. ?--Tylenol prn.  ?-pt denies pain today ?4. Mood: LCSW to follow for evaluation and support.  ?            -antipsychotic agents: N/A ?5. Neuropsych: This patient is not fully capable of making decisions on his own behalf. ?6. Skin/Wound Care: Routine pressure relief measures.  ?7. Fluids/Electrolytes/Nutrition: Monitor I/Os ?8. HTN: Monitor BP TID--continue to monitor ?  ?Vitals:  ? 07/24/21 2105 07/25/21 0401  ?BP: (!) 130/57 (!) 138/57  ?Pulse: 74 80  ?Resp: 16 16  ?Temp: 98.2 ?F (36.8 ?C) 98.6 ?F (37 ?C)  ?SpO2: 94% 97%  ?  3/6 controlled.  on cardura '1mg'$  qhs   ?9. CKD III: Baseline SCr 1.7 to 1.8 range-->ELevated since Oct 2022, no nephrotoxic meds identified  ?             ?BMP Latest Ref Rng & Units 07/25/2021 07/18/2021 07/11/2021  ?Glucose 70 - 99 mg/dL 119(H) 101(H) 103(H)  ?BUN 8 - 23 mg/dL 22 20 25(H)  ?Creatinine 0.61 - 1.24 mg/dL 1.78(H) 1.64(H) 1.70(H)  ?BUN/Creat Ratio 10 - 24 - - -  ?Sodium 135 - 145 mmol/L 140 140 138  ?Potassium 3.5 - 5.1 mmol/L 4.1 3.8 3.8  ?Chloride 98 - 111 mmol/L 109 107 103  ?CO2 22 - 32 mmol/L '23 25 25  '$ ?Calcium 8.9 - 10.3 mg/dL 8.5(L) 8.9 8.6(L)  ? Creatinine 1.78 on 3/6- in range ?10. Hyperactive bladder w/nocturia: Has incontinence has been an issue with urgency-->does not like depends. Nocturia decreased with merabegron plus  low dose alpha blocker  ? ?11.  RA: Has had shoulder pain on and off--uses tylenol prn. Also with left hip contracture which pt states is old xray shows mild OA - no active synovitis  ?12. Vitamin B 12 deficiency: B-12 341-->no on weekly supplement ?13. Pre-diabetes: Hgb A1C-5.8. Monitor FBS with weekly checks. ?14. Hypothyroid: Continue supplement.  ?15.  Slow transit constipation ? Improving ? 16.  Left hip contracture ROM per PT, Xray unremarkable  ? ?LOS: ?18 days ?A FACE TO FACE EVALUATION WAS PERFORMED ? ?[name] ?07/25/2021, 8:16 AM  ? ? ? ?

## 2021-07-25 NOTE — Progress Notes (Signed)
Occupational Therapy Session Note ? ?Patient Details  ?Name: [name] ?MRN: [id number] ?Date of Birth: 1929-04-12 ? ?Today's Date: 07/25/2021 ?OT Individual Time: 6546-5035 ?OT Individual Time Calculation (min): 41 min  ? ? ?Short Term Goals: ?Week 1:  OT Short Term Goal 1 (Week 1): Pt will complete standing grooming task with mod A. ?OT Short Term Goal 1 - Progress (Week 1): Met ?OT Short Term Goal 2 (Week 1): Pt will don shirt with mod A. ?OT Short Term Goal 2 - Progress (Week 1): Met ?OT Short Term Goal 3 (Week 1): Pt will complete toilet transfer with max A. ?OT Short Term Goal 3 - Progress (Week 1): Met ?Week 2:  OT Short Term Goal 1 (Week 2): Pt will complete 2/3 toileting tasks with CGA. ?OT Short Term Goal 1 - Progress (Week 2): Met ?OT Short Term Goal 2 (Week 2): Pt will don pants with min A. ?OT Short Term Goal 2 - Progress (Week 2): Not met ?OT Short Term Goal 3 (Week 2): Pt will maintain static sitting balance EOB for >5 min with close S. ?OT Short Term Goal 3 - Progress (Week 2): Met ?Week 3:  OT Short Term Goal 1 (Week 3): STG = LTG 2/2 ELOS ? ? ?Skilled Therapeutic Interventions/Progress Updates:  ?Patient met seated in recliner in agreement with OT treatment session. 0/10 pain reported at rest and with activity. [name] present for duration of treatment session. Session with focus on self-care re-education and therapeutic activity as detailed below. Sit to stand from recliner with light Min A and use of RW. Functional mobility to commode in bathroom and toilet transfer with CGA and cues for proximity and hand placement. Patient completed 3/3 parts of toileting task with Min A for hygiene/clothing management after BM (charted). Hand washing standing at sink level with CGA for safety. Patient able to maneuver RW in and around tight spaces in hospital room to retrieve face mask with CGA and cues for safety with reaching outside of BOS. Functional mobility to ortho gym with CGA and RW. Cues for  upright posture and proximity to RW. Visual scanning and standing balance (with unilateral vs bilateral UE support) with use of BITs system. Patient able to complete trail making (part A) with increased time/effort and verbal cues. [name] (signed off on transfers) assisted patient back to room at conclusion of session.  ? ?Therapy Documentation ?Precautions:  ?Precautions ?Precautions: Fall ?Precaution Comments: strong posterior bias ?Restrictions ?Weight Bearing Restrictions: No ?General: ?  ?Therapy/Group: Individual Therapy ? ?[name] ?07/25/2021, 10:05 AM ?

## 2021-07-25 NOTE — Progress Notes (Signed)
Speech Language Pathology Daily Session Note ? ?Patient Details  ?Name: [name] ?MRN: [id number] ?Date of Birth: 27-Nov-1928 ? ?Today's Date: 07/25/2021 ?SLP Individual Time: 5621-3086 ?SLP Individual Time Calculation (min): 45 min ? ?Short Term Goals: ?Week 3: SLP Short Term Goal 1 (Week 3): STG=LTG due to ELOS ? ?Skilled Therapeutic Interventions: Skilled ST treatment focused on cognitive goals. Patient completed an alternating attention task with mod I and additional processing time in order to locate pictures on a page which were scattered in various locations. Patient organized pictures of household items/appliances in designated room in home with independence when given additional processing time. Patient identified unsafe home set-up/problematic scenarios via picture scene with sup-to-min A verbal cues for ID problems, and mod I-to-sup A for generating appropriate solutions. Spoke with daughter at end of session who requested OT/PT sessions to be performed in afternoon vs. morning if at all possible, as pt is far more fatigued in the morning per her report. Spoke with scheduling regarding her request and will attempt to accommodate depending on scheduling needs. Patient was left in chair with alarm activated and immediate needs within reach at end of session. Continue per current plan of care.   ?   ?Pain ? None ? ?Therapy/Group: Individual Therapy ? ?Alvey Brockel T Darnette Lampron ?07/25/2021, 3:45 PM ?

## 2021-07-25 NOTE — Progress Notes (Signed)
Physical Therapy Session Note ? ?Patient Details  ?Name: [name] ?MRN: [id number] ?Date of Birth: Nov 03, 1928 ? ?Today's Date: 07/25/2021 ?PT Individual Time: 0915-1000 ?PT Individual Time Calculation (min): 45 min  ? ?Short Term Goals: ?Week 1:  PT Short Term Goal 1 (Week 1): Pt will perform bed mobility with min assist ?PT Short Term Goal 1 - Progress (Week 1): Met ?PT Short Term Goal 2 (Week 1): Pt will transfer to [facility] with mod assist consistenty ?PT Short Term Goal 2 - Progress (Week 1): Met ?PT Short Term Goal 3 (Week 1): Pt will ambulate 90f with mod assist and LRAD ?PT Short Term Goal 3 - Progress (Week 1): Met ?Week 2:  PT Short Term Goal 1 (Week 2): [facility] ?PT Short Term Goal 2 (Week 2): Pt will perform Berg balance assessment ?PT Short Term Goal 3 (Week 2): Pt will ambulate 1554fwith CGA consistently ? ?Skilled Therapeutic Interventions/Progress Updates:  ?Patient seated upright in recliner on entrance to room. Patient alert and agreeable to PT session. In-person interpreter and pt's dtr present.  ? ?Patient with no pain complaint throughout session. ? ?Therapeutic Activity: ?Transfers: Patient performed sit<>stand and stand pivot transfers throughout session with close supervision/ CGA. Provided vc for hand positioning. ?Bed Mobility: Focus this session on supine<>sit transitioning in bed. Pt unable to sit straight up from supine, so instructed pt on log roll technique. Initially requires continuous vc/tc for each step, then improves to vc/ tc for leg positioning, push of UB from bed surface. Improves from Mo[place]o light MinA for pushing up from bed surface. Pt unwilling to perform > 3x so pt rests while therapist provides extended verbal instruction for steps with visual demonstration as pt is a good viMicrobiologistImproved understanding with in-person interpreter as well as dtr translating steps. ? ? ?Patient seated upright  in recliner at end of session  with brakes locked, belt alarm set, and all needs within reach. ? ?Therapy Documentation ?Precautions:  ?Precautions ?Precautions: Fall ?Precaution Comments: strong posterior bias ?Restrictions ?Weight Bearing Restrictions: No ?General: ?  ?Vital Signs: ? ?Pain: ? No pain complaint this session.  ? ?Therapy/Group: Individual Therapy ? ?JuAlger SimonsT, DPT ?07/25/2021, 10:29 AM  ?

## 2021-07-25 NOTE — Progress Notes (Signed)
Patient ID: [name], male   DOB: 04/10/1929, 88 y.o.   MRN: [id number] ? ?Shower Chair ordered through [employer].  ?

## 2021-07-25 NOTE — Plan of Care (Signed)
?  Problem: RH BOWEL ELIMINATION ?Goal: RH STG MANAGE BOWEL WITH ASSISTANCE ?Description: STG Manage Bowel with mod I  Assistance. ?Outcome: Not Progressing;  incontinence ?  ?Problem: RH BLADDER ELIMINATION ?Goal: RH STG MANAGE BLADDER WITH ASSISTANCE ?Description: STG Manage Bladder With  mod I Assistance ?Outcome: Not Progressing; incontinence ?  ?

## 2021-07-26 NOTE — Progress Notes (Signed)
Occupational Therapy Session Note ? ?Patient Details  ?Name: [name] ?MRN: [id number] ?Date of Birth: 1929-01-09 ? ?Today's Date: 07/26/2021 ?OT Individual Time: 4627-0350 ?OT Individual Time Calculation (min): 42 min  ? ? ?Short Term Goals: ?Week 1:  OT Short Term Goal 1 (Week 1): Pt will complete standing grooming task with mod A. ?OT Short Term Goal 1 - Progress (Week 1): Met ?OT Short Term Goal 2 (Week 1): Pt will don shirt with mod A. ?OT Short Term Goal 2 - Progress (Week 1): Met ?OT Short Term Goal 3 (Week 1): Pt will complete toilet transfer with max A. ?OT Short Term Goal 3 - Progress (Week 1): Met ?Week 2:  OT Short Term Goal 1 (Week 2): Pt will complete 2/3 toileting tasks with CGA. ?OT Short Term Goal 1 - Progress (Week 2): Met ?OT Short Term Goal 2 (Week 2): Pt will don pants with min A. ?OT Short Term Goal 2 - Progress (Week 2): Not met ?OT Short Term Goal 3 (Week 2): Pt will maintain static sitting balance EOB for >5 min with close S. ?OT Short Term Goal 3 - Progress (Week 2): Met ?Week 3:  OT Short Term Goal 1 (Week 3): STG = LTG 2/2 ELOS ? ?Skilled Therapeutic Interventions/Progress Updates:  ?Patient met lying supine in bed in agreement with OT treatment session. 0/10 pain reported at rest and with activity. Patient indicating need to have BM. Supine to EOB with Min A at trunk and Mod multimodal cues. Sit to stand from EOB with Min A for boosting and cues for hand placement. Functional mobility to commode in bathroom with RW and cues for attention to L. Toilet transfer with Min A and cues as indicated above. Assist to manage clothing. After extended period of time patient unable to move bowels. LB bathing/dressing with Mod A overall and Max multimodal cues for sequencing. Requires increased time for completion of all tasks. Hand hygiene completed standing at sink level with CGA. Daughter present at bedside throughout. Session concluded with patient seated in recliner with call bell within reach,  belt alarm activated and all needs met.  ? ?Therapy Documentation ?Precautions:  ?Precautions ?Precautions: Fall ?Precaution Comments: strong posterior bias ?Restrictions ?Weight Bearing Restrictions: No ?General: ?  ?Therapy/Group: Individual Therapy ? ?Janele Lague R Howerton-Davis ?07/26/2021, 7:16 AM ?

## 2021-07-26 NOTE — Progress Notes (Signed)
?                                                       PROGRESS NOTE ? ? ?Subjective/Complaints: ? ?No issues overnite  ? ?ROS: limited due to language/communication  ? ?Objective: ?  ?No results found. ?Recent Labs  ?  07/25/21 ?9357  ?WBC 8.1  ?HGB 11.6*  ?HCT 36.4*  ?PLT 206  ? ? ? ?Recent Labs  ?  07/25/21 ?0177  ?NA 140  ?K 4.1  ?CL 109  ?CO2 23  ?GLUCOSE 119*  ?BUN 22  ?CREATININE 1.78*  ?CALCIUM 8.5*  ? ? ? ? ?Intake/Output Summary (Last 24 hours) at 07/26/2021 0742 ?Last data filed at 07/26/2021 0349 ?Gross per 24 hour  ?Intake 235 ml  ?Output 600 ml  ?Net -365 ml  ? ?  ? ?  ? ?Physical Exam: ?Vital Signs ?Blood pressure (!) 135/53, pulse 66, temperature 98.5 ?F (36.9 ?C), temperature source Oral, resp. rate 16, height '4\' 11"'$  (1.499 m), weight 53.5 kg, SpO2 94 %. ? ? ?General: No acute distress ?Mood and affect are appropriate ?Heart: Regular rate and rhythm no rubs murmurs or extra sounds ?Lungs: Clear to auscultation, breathing unlabored, no rales or wheezes ?Abdomen: Positive bowel sounds, soft nontender to palpation, nondistended ?Extremities: No clubbing, cyanosis, or edema ?Skin: No evidence of breakdown, no evidence of rash ? ? ?Skin: No evidence of breakdown, no evidence of rash ?Neuro: Alert, demonstrates reasonable comprehension ?Motor: RUE/RLE: 5/5 proximal distal ?LUE/LLE: 4 -/5 proximal distal with apraxia  ? ?Assessment/Plan: ?1. Functional deficits which require 3+ hours per day of interdisciplinary therapy in a comprehensive inpatient rehab setting. ?Physiatrist is providing close team supervision and 24 hour management of active medical problems listed below. ?Physiatrist and rehab team continue to assess barriers to discharge/monitor patient progress toward functional and medical goals ? ?Care Tool: ? ?Bathing ?   ?Body parts bathed by patient: Right arm, Left arm, Chest, Abdomen, Face  ? Body parts bathed by helper: Buttocks, Right lower leg, Left lower leg, Right upper leg, Left upper leg ?   ?  ?Bathing assist Assist Level: Supervision/Verbal cueing ?  ?  ?Upper Body Dressing/Undressing ?Upper body dressing   ?What is the patient wearing?: Button up shirt ?   ?Upper body assist Assist Level: Maximal Assistance - Patient 25 - 49% ?   ?Lower Body Dressing/Undressing ?Lower body dressing ? ? ?   ?What is the patient wearing?: Pants ? ?  ? ?Lower body assist Assist for lower body dressing: Minimal Assistance - Patient > 75% ?   ? ?Toileting ?Toileting Toileting Activity did not occur (Probation officer and hygiene only): N/A (no void or bm)  ?Toileting assist Assist for toileting: Minimal Assistance - Patient > 75% ?  ?  ?Transfers ?Chair/bed transfer ? ?Transfers assist ?   ? ?Chair/bed transfer assist level: Minimal Assistance - Patient > 75% ?Chair/bed transfer assistive device: Armrests, Walker ?  ?Locomotion ?Ambulation ? ? ?Ambulation assist ? ? Ambulation activity did not occur: Safety/medical concerns ? ?Assist level: Minimal Assistance - Patient > 75% ?Assistive device: Walker-rolling ?Max distance: 54f  ? ?Walk 10 feet activity ? ? ?Assist ? Walk 10 feet activity did not occur: Safety/medical concerns ? ?Assist level: Contact Guard/Touching assist ?Assistive device: Walker-rolling  ? ?Walk 50 feet activity ? ? ?  Assist Walk 50 feet with 2 turns activity did not occur: Safety/medical concerns ? ?  ?   ? ? ?Walk 150 feet activity ? ? ?Assist Walk 150 feet activity did not occur: Safety/medical concerns ? ?  ?  ?  ? ?Walk 10 feet on uneven surface  ?activity ? ? ?Assist Walk 10 feet on uneven surfaces activity did not occur: Safety/medical concerns ? ? ?  ?   ? ?Wheelchair ? ? ? ? ?Assist Is the patient using a wheelchair?: Yes ?Type of Wheelchair: Manual ?  ? ?Wheelchair assist level: Total Assistance - Patient < 25% ?Max wheelchair distance: 150  ? ? ?Wheelchair 50 feet with 2 turns activity ? ? ? ?Assist ? ?  ?  ? ? ?Assist Level: Total Assistance - Patient < 25%  ? ?Wheelchair 150 feet  activity  ? ? ? ?Assist ?   ? ? ?Assist Level: Total Assistance - Patient < 25%  ? ?Blood pressure (!) 135/53, pulse 66, temperature 98.5 ?F (36.9 ?C), temperature source Oral, resp. rate 16, height '4\' 11"'$  (1.499 m), weight 53.5 kg, SpO2 94 %. ? ?Medical Problem List and Plan: ?1. 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 -Continue CIR therapies including PT, OT, and SLP  ?ELOS 3/10-team conf in am  ? ?2.  Antithrombotics: ?-DVT/anticoagulation:  Pharmaceutical: Lovenox ?            -antiplatelet therapy: DAPT X 3 months followed by Plavix alone ?3. Left shoulder pain/Pain Management: Continue Sportscreme to left shoulder and tylenol at bedtime. ?--Tylenol prn.  ?-pt denies pain today ?4. Mood: LCSW to follow for evaluation and support.  ?            -antipsychotic agents: N/A ?5. Neuropsych: This patient is not fully capable of making decisions on his own behalf. ?6. Skin/Wound Care: Routine pressure relief measures.  ?7. Fluids/Electrolytes/Nutrition: Monitor I/Os ?8. HTN: Monitor BP TID--continue to monitor ?  ?Vitals:  ? 07/25/21 1945 07/26/21 0301  ?BP: 126/63 (!) 135/53  ?Pulse: 78 66  ?Resp: 18 16  ?Temp: 98.1 ?F (36.7 ?C) 98.5 ?F (36.9 ?C)  ?SpO2: 98% 94%  ?  3/7 controlled.  on cardura '1mg'$  qhs   ?9. CKD III: Baseline SCr 1.7 to 1.8 range-->ELevated since Oct 2022, no nephrotoxic meds identified  ?             ?BMP Latest Ref Rng & Units 07/25/2021 07/18/2021 07/11/2021  ?Glucose 70 - 99 mg/dL 119(H) 101(H) 103(H)  ?BUN 8 - 23 mg/dL 22 20 25(H)  ?Creatinine 0.61 - 1.24 mg/dL 1.78(H) 1.64(H) 1.70(H)  ?BUN/Creat Ratio 10 - 24 - - -  ?Sodium 135 - 145 mmol/L 140 140 138  ?Potassium 3.5 - 5.1 mmol/L 4.1 3.8 3.8  ?Chloride 98 - 111 mmol/L 109 107 103  ?CO2 22 - 32 mmol/L '23 25 25  '$ ?Calcium 8.9 - 10.3 mg/dL 8.5(L) 8.9 8.6(L)  ? Creatinine 1.78 on 3/6- in range ?10. Hyperactive bladder w/nocturia: Has incontinence has been an issue with urgency-->does not like depends. Nocturia  decreased with merabegron plus low dose alpha blocker  ? ?11.  RA: Has had shoulder pain on and off--uses tylenol prn. Also with left hip contracture which pt states is old xray shows mild OA - no active synovitis  ?12. Vitamin B 12 deficiency: B-12 341-->no on weekly supplement ?13. Pre-diabetes: Hgb A1C-5.8. Monitor FBS with weekly checks. ?14. Hypothyroid: Continue supplement.  ?15.  Slow transit constipation ?  Improving ? 16.  Left hip contracture ROM per PT, Xray unremarkable  ? ?LOS: ?19 days ?A FACE TO FACE EVALUATION WAS PERFORMED ? ?Luanna Salk Chyane Greer ?07/26/2021, 7:42 AM  ? ? ? ?

## 2021-07-26 NOTE — Progress Notes (Signed)
Physical Therapy Session Note ? ?Patient Details  ?Name: [name] ?MRN: [id number] ?Date of Birth: 22-May-1929 ? ?Today's Date: 07/26/2021 ?PT Individual Time: 7510-2585 ?PT Individual Time Calculation (min): 47 min  ? ?Short Term Goals: ?Week 1:  PT Short Term Goal 1 (Week 1): Pt will perform bed mobility with min assist ?PT Short Term Goal 1 - Progress (Week 1): Met ?PT Short Term Goal 2 (Week 1): Pt will transfer to [facility] with mod assist consistenty ?PT Short Term Goal 2 - Progress (Week 1): Met ?PT Short Term Goal 3 (Week 1): Pt will ambulate 72f with mod assist and LRAD ?PT Short Term Goal 3 - Progress (Week 1): Met ?Week 2:  PT Short Term Goal 1 (Week 2): [facility] ?PT Short Term Goal 2 (Week 2): Pt will perform Berg balance assessment ?PT Short Term Goal 3 (Week 2): Pt will ambulate 1564fwith CGA consistently ? ?Skilled Therapeutic Interventions/Progress Updates:  ?Patient seated upright in recliner on entrance to room. Patient alert and agreeable to PT session. No in-person interpreter present. Dtr not present.  ? ?Patient with no pain complaint throughout session. ? ?Therapeutic Activity: ?Transfers: Patient performed sit<>stand and stand pivot transfers throughout session with close supervision/ CGA. Provided verbal cues for hand placement in split positioning. ? ?Gait Training:  ?Patient ambulated >200 ft using RW with close supervision/ CGA. Demonstrated improving foot clearance with LLE. Provided vc/ tc for upright posture, increased step height with LLE.  ? ?Neuromuscular Re-ed: ?NMR facilitated during session with focus on standing balance with UB mobility and lateral dynamic stepping. Pt guided in use of RW to sidestep to reach cards with LUE, pass to RUE, then sidestep to R in order to match to card on upright board. Requires vc to maintain positioning but can safely perform. Pt then directed to touch 2# weight bar to ball held at differing positions from  slightly over head level and to L/ R of pt in order to promote improved L shoulder strength and trunk rotation. Pt then guided in hand touches to target on opposite side requiring increased trunk rotation. No LOB throughout. NMR performed for improvements in motor control and coordination, balance, sequencing, judgement, and self confidence/ efficacy in performing all aspects of mobility at highest level of independence.  ? ?Patient seated  in recliner at end of session with brakes locked, belt alarm set, and all needs within reach. ?  ? ?Therapy Documentation ?Precautions:  ?Precautions ?Precautions: Fall ?Precaution Comments: strong posterior bias ?Restrictions ?Weight Bearing Restrictions: No ?General: ?  ?Vital Signs: ?Therapy Vitals ?Temp: 98.1 ?F (36.7 ?C) ?Temp Source: Oral ?Pulse Rate: 66 ?Resp: 16 ?BP: (!) 129/56 ?Patient Position (if appropriate): Sitting ?Oxygen Therapy ?SpO2: 97 % ?O2 Device: Room Air ?Pain: ? No pain complaint this session.  ? ?Therapy/Group: Individual Therapy ? ?JuAlger SimonsT, DPT ?07/26/2021, 5:59 PM  ?

## 2021-07-26 NOTE — Progress Notes (Signed)
Occupational Therapy Session Note ? ?Patient Details  ?Name: [name] ?MRN: [id number] ?Date of Birth: Jan 11, 1929 ? ?Today's Date: 07/26/2021 ?OT Individual Time: 5885-0277 ?OT Individual Time Calculation (min): 60 min  ? ? ?Short Term Goals: ?Week 1:  OT Short Term Goal 1 (Week 1): Pt will complete standing grooming task with mod A. ?OT Short Term Goal 1 - Progress (Week 1): Met ?OT Short Term Goal 2 (Week 1): Pt will don shirt with mod A. ?OT Short Term Goal 2 - Progress (Week 1): Met ?OT Short Term Goal 3 (Week 1): Pt will complete toilet transfer with max A. ?OT Short Term Goal 3 - Progress (Week 1): Met ?Week 2:  OT Short Term Goal 1 (Week 2): Pt will complete 2/3 toileting tasks with CGA. ?OT Short Term Goal 1 - Progress (Week 2): Met ?OT Short Term Goal 2 (Week 2): Pt will don pants with min A. ?OT Short Term Goal 2 - Progress (Week 2): Not met ?OT Short Term Goal 3 (Week 2): Pt will maintain static sitting balance EOB for >5 min with close S. ?OT Short Term Goal 3 - Progress (Week 2): Met ? ?Skilled Therapeutic Interventions/Progress Updates:  ?  Patient seated in recliner with family present to assist with translation.  Patient complete AROM incorporating BUE to improve communication for scapular and trunk movement for functional task initiation and completion.  The pt was able to move in shld flexion, horizontal abduction, shld shrugs, and shld rotation as a precursor to functional task performance.  The pt was instructed in relaxatio breathing to improve compliance as well.  The pt completed sit to stand was completing a table top card activity to improve functional balance for standing for static and dynamic balance.  The pt was able to complete a task involving bilateral hand manipulation of soft grade theraputty for in hand manipulation and bilateral hand engagement.  The pt complete a simulated task in dressing UB/LB incorporating the RW with MinA for donning the theraband over his feet.  The pt was  able to wash his face and hands with s/uA to improve his independence and endurance with greater safety.  The pt returned to the recliner at the end of treatment with his alarm in place, his bedside table and call light in place and all additional needs addressed.  The pt had no c/o pain this treatment session.  ?  ?Therapy Documentation ?Precautions:  ?Precautions ?Precautions: Fall ?Precaution Comments: strong posterior bias ?Restrictions ?Weight Bearing Restrictions: No ? ? ?Therapy/Group: Individual Therapy ? ?Yvonne Kendall ?07/26/2021, 1:52 PM ?

## 2021-07-26 NOTE — Progress Notes (Signed)
Patient ID: [name], male   DOB: 06/16/1928, 88 y.o.   MRN: [id number] ? ?Patient daughter declined shower chair, will like to purchase privately.  ?

## 2021-07-26 NOTE — Progress Notes (Signed)
Patient ID: [name], male   DOB: June 25, 1928, 88 y.o.   MRN: [id number] ? ?[place] orders sent to facility director, Rollene Fare ?

## 2021-07-26 NOTE — Progress Notes (Signed)
Patient had very good night, slept well. ?

## 2021-07-27 NOTE — Progress Notes (Signed)
Physical Therapy Session Note ? ?Patient Details  ?Name: [name] ?MRN: [id number] ?Date of Birth: 20-Nov-1928 ? ?Today's Date: 07/27/2021 ?PT Individual Time: 3149-7026 ?PT Individual Time Calculation (min): 53 min  ? ?Short Term Goals: ? ?Week 2:  PT Short Term Goal 1 (Week 2): Pt will transfer to and from [facility] with CGA consistently ?PT Short Term Goal 2 (Week 2): Pt will perform Berg balance assessment ?PT Short Term Goal 3 (Week 2): Pt will ambulate 172f with CGA consistently ? ? ?Skilled Therapeutic Interventions/Progress Updates:  ? ?Pt received sitting in recliner and agreeable to PT. [facility] and supervision assist from PT with cues for improved anterior weight shift with transition into standing.  ? ?Gait training with RW in rehab gym x 1573f Supervision assist from PT with cues for safety in turns no LOB noted ? ?Forward reach with BUE to touch ankles from Edge of WC. Blocked practice sit<>stand with RW to push RW forward to initiate standing x 4 pushing from WC arm rest and 1 UE supported on RW. Emphasis on pushing RW forward to stand rather than pulling on RW. Performed x 5 +2 with short rest break between bouts.  ? ?PT instructed pt in modified [place] level A balance program. Mini squats, hip abduction, HS curl, tandem stance, each performed x 10 with BUE support on parallel bars. Cues for improved posture and decreased compensation through pelvic rotation.    ? ?Side stepping in parallel bars [id number] 2 bil. Forward/reverse gait in parallel bars [id number]. Foot tables on 6inch cone with UE supported on rails forward and lateral x 10 bil with cues for improved posture and decreased compensation through trunk.  ? ?Pt returned to room and performed stand pivot transfer to bed with supervision assist and UE support on RW. Sit>supine completed with supervision assist and cues for LE position. Pt left supine in bed with call bell in reach and all needs met.  ? ? ? ?   ? ?Therapy  Documentation ?Precautions:  ?Precautions ?Precautions: Fall ?Precaution Comments: strong posterior bias ?Restrictions ?Weight Bearing Restrictions: No ? ?  ?Vital Signs: ?Therapy Vitals ?Temp: 98.3 ?F (36.8 ?C) ?Temp Source: Oral ?Pulse Rate: 71 ?BP: 133/67 ?Patient Position (if appropriate): Sitting ?Oxygen Therapy ?SpO2: 98 % ?O2 Device: Room Air ?Pain: ?denies ? ? ?Therapy/Group: Individual Therapy ? ?AustLorie Phenix8/2023, 2:07 PM  ?

## 2021-07-27 NOTE — Progress Notes (Signed)
Physical Therapy Session Note ? ?Patient Details  ?Name: [name] ?MRN: [id number] ?Date of Birth: 04-05-1929 ? ?Today's Date: 07/27/2021 ?PT Individual Time: 1030-1100 ?PT Individual Time Calculation (min): 30 min  ? ?Short Term Goals: ?Week 1:  PT Short Term Goal 1 (Week 1): Pt will perform bed mobility with min assist ?PT Short Term Goal 1 - Progress (Week 1): Met ?PT Short Term Goal 2 (Week 1): Pt will transfer to [facility] with mod assist consistenty ?PT Short Term Goal 2 - Progress (Week 1): Met ?PT Short Term Goal 3 (Week 1): Pt will ambulate 11f with mod assist and LRAD ?PT Short Term Goal 3 - Progress (Week 1): Met ?Week 2:  PT Short Term Goal 1 (Week 2): [facility] ?PT Short Term Goal 2 (Week 2): Pt will perform Berg balance assessment ?PT Short Term Goal 3 (Week 2): Pt will ambulate 15101fwith CGA consistently ?Week 3:    ? ?Skilled Therapeutic Interventions/Progress Updates:  ?  Initially in recliner.  Daughter present thru session, requests I order RW as they relized he did not have one at home.  SW contacted to notify. ? ?Sit to stand w/cues to scoot to edge of chair w/cga. ?Gait 15011f/RW, cues for improved upright posture, cues to attend to L side of walker as he veers closely to wall. ? ? ?Functional gait incorporating naviation of obstacles and locating and retrieving objects in L environment w/significantly decreased gait speed, occasional cues for safety w/reaching for objects/to remain close to object for improved stability but no LOB, overall cga. ? ?Gait 150f69ffocus on increased gait speed.  Improved efficiency and step length but L clearance declines w/fatigue. ? ?Pt left oob in recliner w/chair alarm set and needs in reach. ? ? ?Therapy Documentation ?Precautions:  ?Precautions ?Precautions: Fall ?Precaution Comments: strong posterior bias ?Restrictions ?Weight Bearing Restrictions: No ? ? ?Therapy/Group: Individual Therapy ?BarbCallie FieldingT ? ? ?BarbJerrilyn Cairo8/2023, 11:05 AM  ?

## 2021-07-27 NOTE — Progress Notes (Signed)
Inpatient Rehabilitation Discharge Medication Review by a Pharmacist ? ?A complete drug regimen review was completed for this patient to identify any potential clinically significant medication issues. ? ?High Risk Drug Classes Is patient taking? Indication by Medication  ?Antipsychotic No   ?Anticoagulant No   ?Antibiotic No   ?Opioid No   ?Antiplatelet Yes Aspirin/Plavix for CVA x 3 months (09/22/21) then Plavix alone  ?Hypoglycemics/insulin No   ?Vasoactive Medication Yes Doxazosin for urinary frequency  ?Chemotherapy No   ?Other Yes Atorvastatin for HLD ?Synthroid for low thyroid ?Myrbetriq for urinary frequency ?Protonix for GERD  ? ? ? ?Type of Medication Issue Identified Description of Issue Recommendation(s)  ?Drug Interaction(s) (clinically significant) ?    ?Duplicate Therapy ?    ?Allergy ?    ?No Medication Administration End Date ?    ?Incorrect Dose ?    ?Additional Drug Therapy Needed ?    ?Significant med changes from prior encounter (inform family/care partners about these prior to discharge).    ?Other ?    ? ? ?Clinically significant medication issues were identified that warrant physician communication and completion of prescribed/recommended actions by midnight of the next day:  No ? ?Pharmacist comments: None ? ?Time spent performing this drug regimen review (minutes):  20 minutes ? ? ?Tad Moore ?07/27/2021 10:36 AM ?

## 2021-07-27 NOTE — Progress Notes (Signed)
Patient ID: [name], male   DOB: 02-05-1929, 88 y.o.   MRN: [id number] ? ?RW ordered through [employer] per therapy request ?

## 2021-07-27 NOTE — Progress Notes (Signed)
Occupational Therapy Session Note ? ?Patient Details  ?Name: [name] ?MRN: [id number] ?Date of Birth: [birth year] ? ?Today's Date: 07/27/2021 ?OT Individual Time: 0900-1000 ?OT Individual Time Calculation (min): 60 min  ? ? ?Short Term Goals: ?Week 1:  OT Short Term Goal 1 (Week 1): Pt will complete standing grooming task with mod A. ?OT Short Term Goal 1 - Progress (Week 1): Met ?OT Short Term Goal 2 (Week 1): Pt will don shirt with mod A. ?OT Short Term Goal 2 - Progress (Week 1): Met ?OT Short Term Goal 3 (Week 1): Pt will complete toilet transfer with max A. ?OT Short Term Goal 3 - Progress (Week 1): Met ?Week 2:  OT Short Term Goal 1 (Week 2): Pt will complete 2/3 toileting tasks with CGA. ?OT Short Term Goal 1 - Progress (Week 2): Met ?OT Short Term Goal 2 (Week 2): Pt will don pants with min A. ?OT Short Term Goal 2 - Progress (Week 2): Not met ?OT Short Term Goal 3 (Week 2): Pt will maintain static sitting balance EOB for >5 min with close S. ?OT Short Term Goal 3 - Progress (Week 2): Met ? ?Skilled Therapeutic Interventions/Progress Updates:  ?  Patient seated in recliner upon arrival, patient indicated that he had rested and was eager to start OT treatment. Patient tolerated massage of bilateral trunk region to reduce the tone as a precursor to AROM of BUE. Patient able to incorporate BUE in shld shrugs, shld rotation, shld flexion, and horizontal abduction to improve upon UB strength and coordination during functional task performance.  The was able to ambulate to the restroom and doff his pant and brief with constant vc's  and MinA for doffing and donning his LB garments.  The pt ambulated to his main quarters using a RW and CGA for balance and verbal prompts for head position to improve his anatomical position to reduce his risk for fall.  The pt was able to go to the sink area to wash his hands while standing, alternating the position of his feet to improve his static and dynamic balance.  The pt  returned to his recliner after the session with his alarm. Bedside table , and call light within reach.  The pt had no additional concern or report of pain this treatment session.  ? ?Therapy Documentation ?Precautions:  ?Precautions ?Precautions: Fall ?Precaution Comments: strong posterior bias ?Restrictions ?Weight Bearing Restrictions: No ? ?Therapy/Group: Individual Therapy ? ?Yvonne Kendall ?07/27/2021, 12:15 PM ?

## 2021-07-27 NOTE — Progress Notes (Shared)
Inpatient Rehabilitation Care Coordinator Discharge Note   Patient Details  Name: [name] MRN: [id number] Date of Birth: 09-03-1928   Discharge location: Home  Length of Stay: 22 Days  Discharge activity level: sup/Min A  Home/community participation: daughter  Patient response ZS:MOLMBE Literacy - How often do you need to have someone help you when you read instructions, pamphlets, or other written material from your doctor or pharmacy?: Never  Patient response ML:JQGBEE Isolation - How often do you feel lonely or isolated from those around you?: Never  Services provided included: SW, Neuropsych, Pharmacy, TR, CM, RN, SLP, OT, PT, RD, MD  Financial Services:  Financial Services Utilized: [place] offered to/list presented to: patient daughter  Follow-up services arranged:  [place]: ILF/ALF         Patient response to transportation need: Is the patient able to respond to transportation needs?: Yes In the past 12 months, has lack of transportation kept you from medical appointments or from getting medications?: No In the past 12 months, has lack of transportation kept you from meetings, work, or from getting things needed for daily living?: No    Comments (or additional information):  Patient/Family verbalized understanding of follow-up arrangements:  Yes  Individual responsible for coordination of the follow-up plan: [name] [phone]  Confirmed correct DME delivered: [name] 07/27/2021    [name]

## 2021-07-27 NOTE — Progress Notes (Signed)
?                                                       PROGRESS NOTE ? ? ?Subjective/Complaints: ? ?No issues overnite  ? ?ROS: limited due to language/communication  ? ?Objective: ?  ?No results found. ?Recent Labs  ?  07/25/21 ?3419  ?WBC 8.1  ?HGB 11.6*  ?HCT 36.4*  ?PLT 206  ? ? ? ?Recent Labs  ?  07/25/21 ?6222  ?NA 140  ?K 4.1  ?CL 109  ?CO2 23  ?GLUCOSE 119*  ?BUN 22  ?CREATININE 1.78*  ?CALCIUM 8.5*  ? ? ? ? ?Intake/Output Summary (Last 24 hours) at 07/27/2021 0847 ?Last data filed at 07/27/2021 0350 ?Gross per 24 hour  ?Intake 720 ml  ?Output 300 ml  ?Net 420 ml  ? ?  ? ?  ? ?Physical Exam: ?Vital Signs ?Blood pressure (!) 169/65, pulse 67, temperature 97.8 ?F (36.6 ?C), temperature source Oral, resp. rate 15, height _0  (1.499 m), weight 54.5 kg, SpO2 99 %. ? ? ?General: No acute distress ?Mood and affect are appropriate ?Heart: Regular rate and rhythm no rubs murmurs or extra sounds ?Lungs: Clear to auscultation, breathing unlabored, no rales or wheezes ?Abdomen: Positive bowel sounds, soft nontender to palpation, nondistended ?Extremities: No clubbing, cyanosis, or edema ?Skin: No evidence of breakdown, no evidence of rash ? ? ?Skin: No evidence of breakdown, no evidence of rash ?Neuro: Alert, demonstrates reasonable comprehension ?Motor: RUE/RLE: 5/5 proximal distal ?LUE/LLE: 4 -/5 proximal distal with apraxia  ? ?Assessment/Plan: ?1. Functional deficits which require 3+ hours per day of interdisciplinary therapy in a comprehensive inpatient rehab setting. ?Physiatrist is providing close team supervision and 24 hour management of active medical problems listed below. ?Physiatrist and rehab team continue to assess barriers to discharge/monitor patient progress toward functional and medical goals ? ?Care Tool: ? ?Bathing ?   ?Body parts bathed by patient: Right arm, Left arm, Chest, Abdomen, Face  ? Body parts bathed by helper: Buttocks, Right lower leg, Left lower leg, Right upper leg, Left upper leg ?   ?  ?Bathing assist Assist Level: Supervision/Verbal cueing ?  ?  ?Upper Body Dressing/Undressing ?Upper body dressing   ?What is the patient wearing?: Button up shirt ?   ?Upper body assist Assist Level: Maximal Assistance - Patient 25 - 49% ?   ?Lower Body Dressing/Undressing ?Lower body dressing ? ? ?   ?What is the patient wearing?: Pants ? ?  ? ?Lower body assist Assist for lower body dressing: Minimal Assistance - Patient > 75% ?   ? ?Toileting ?Toileting Toileting Activity did not occur (Probation officer and hygiene only): N/A (no void or bm)  ?Toileting assist Assist for toileting: Minimal Assistance - Patient > 75% ?  ?  ?Transfers ?Chair/bed transfer ? ?Transfers assist ?   ? ?Chair/bed transfer assist level: Minimal Assistance - Patient > 75% ?Chair/bed transfer assistive device: Armrests, Walker ?  ?Locomotion ?Ambulation ? ? ?Ambulation assist ? ? Ambulation activity did not occur: Safety/medical concerns ? ?Assist level: Minimal Assistance - Patient > 75% ?Assistive device: Walker-rolling ?Max distance: 26f  ? ?Walk 10 feet activity ? ? ?Assist ? Walk 10 feet activity did not occur: Safety/medical concerns ? ?Assist level: Contact Guard/Touching assist ?Assistive device: Walker-rolling  ? ?Walk 50 feet activity ? ? ?  Assist Walk 50 feet with 2 turns activity did not occur: Safety/medical concerns ? ?  ?   ? ? ?Walk 150 feet activity ? ? ?Assist Walk 150 feet activity did not occur: Safety/medical concerns ? ?  ?  ?  ? ?Walk 10 feet on uneven surface  ?activity ? ? ?Assist Walk 10 feet on uneven surfaces activity did not occur: Safety/medical concerns ? ? ?  ?   ? ?Wheelchair ? ? ? ? ?Assist Is the patient using a wheelchair?: Yes ?Type of Wheelchair: Manual ?  ? ?Wheelchair assist level: Total Assistance - Patient < 25% ?Max wheelchair distance: 150  ? ? ?Wheelchair 50 feet with 2 turns activity ? ? ? ?Assist ? ?  ?  ? ? ?Assist Level: Total Assistance - Patient < 25%  ? ?Wheelchair 150 feet  activity  ? ? ? ?Assist ?   ? ? ?Assist Level: Total Assistance - Patient < 25%  ? ?Blood pressure (!) 169/65, pulse 67, temperature 97.8 ?F (36.6 ?C), temperature source Oral, resp. rate 15, height _0  (1.499 m), weight 54.5 kg, SpO2 99 %. ? ?Medical Problem List and Plan: ?1. 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 -Continue CIR therapies including PT, OT, and SLP  ?ELOS 3/10-Team conference today please see physician documentation under team conference tab, met with team  to discuss problems,progress, and goals. Formulized individual treatment plan based on medical history, underlying problem and comorbidities. ? ? ?2.  Antithrombotics: ?-DVT/anticoagulation:  Pharmaceutical: Lovenox ?            -antiplatelet therapy: DAPT X 3 months followed by Plavix alone ?3. Left shoulder pain/Pain Management: Continue Sportscreme to left shoulder and tylenol at bedtime. ?--Tylenol prn.  ?-pt denies pain today ?4. Mood: LCSW to follow for evaluation and support.  ?            -antipsychotic agents: N/A ?5. Neuropsych: This patient is not fully capable of making decisions on his own behalf. ?6. Skin/Wound Care: Routine pressure relief measures.  ?7. Fluids/Electrolytes/Nutrition: Monitor I/Os ?8. HTN: Monitor BP TID--continue to monitor ?  ?Vitals:  ? 07/26/21 1954 07/27/21 0352  ?BP: (!) 122/56 (!) 169/65  ?Pulse: 71 67  ?Resp: 16 15  ?Temp: 98.8 ?F (37.1 ?C) 97.8 ?F (36.6 ?C)  ?SpO2: 99% 99%  ?  3/8 has been controlled but elevated this am early morning.  on cardura 28m qhs  , will monitor on current dose  ?9. CKD III: Baseline SCr 1.7 to 1.8 range-->ELevated since Oct 2022, no nephrotoxic meds identified  ?             ?BMP Latest Ref Rng & Units 07/25/2021 07/18/2021 07/11/2021  ?Glucose 70 - 99 mg/dL 119(H) 101(H) 103(H)  ?BUN 8 - 23 mg/dL 22 20 25(H)  ?Creatinine 0.61 - 1.24 mg/dL 1.78(H) 1.64(H) 1.70(H)  ?BUN/Creat Ratio 10 - 24 - - -  ?Sodium 135 - 145 mmol/L 140 140 138   ?Potassium 3.5 - 5.1 mmol/L 4.1 3.8 3.8  ?Chloride 98 - 111 mmol/L 109 107 103  ?CO2 22 - 32 mmol/L _1 ?Calcium 8.9 - 10.3 mg/dL 8.5(L) 8.9 8.6(L)  ? Creatinine 1.78 on 3/6- in range ?10. Hyperactive bladder w/nocturia: Has incontinence has been an issue with urgency-->does not like depends. Nocturia decreased with merabegron plus low dose alpha blocker  ? ?11.  RA: Has had shoulder pain on and off--uses tylenol prn. Also with left hip contracture  which pt states is old xray shows mild OA - no active synovitis  ?12. Vitamin B 12 deficiency: B-12 341-->no on weekly supplement ?13. Pre-diabetes: Hgb A1C-5.8. Monitor FBS with weekly checks. ?14. Hypothyroid: Continue supplement.  ?15.  Slow transit constipation ? Improving ? 16.  Left hip contracture ROM per PT, Xray unremarkable  ? ?LOS: ?20 days ?A FACE TO FACE EVALUATION WAS PERFORMED ? ?Luanna Salk Miciah Shealy ?07/27/2021, 8:47 AM  ? ? ? ?

## 2021-07-27 NOTE — Plan of Care (Signed)
Slept through the night.

## 2021-07-27 NOTE — Progress Notes (Signed)
Patient ID: [name], male   DOB: [birth year], 88 y.o.   MRN: [id number] ? ?SW made attempt to call patient daughter. Daughter with mother at appointment will call SW back  ?

## 2021-07-28 NOTE — Progress Notes (Signed)
Physical Therapy Session Note ? ?Patient Details  ?Name: [name] ?MRN: [id number] ?Date of Birth: 03-14-1929 ? ?Today's Date: 07/28/2021 ?PT Individual Time: 7588-3254 ?PT Individual Time Calculation (min): 45 min  ? ?Short Term Goals: ?Week 3:    ? ?Skilled Therapeutic Interventions/Progress Updates: Pt presents sitting semi-reclined in bed and finishing lunch.  Pt agreeable to therapy.  Pt required min A for sup to sit using siderail.  Pt transfers sit to stand from bed w/ min A and verbal cues.  Pt daughter present for session.  Pt amb 130' w/ RW and CGA to min A as fatigues.  Pt continues to amb w/ ER L foot and verbal cues for posture and walker management, foot clearance.  Pt transfers sit to stand from arm chair w/ verbal cues and education for daughter re: forward scoot and lean to improve independence and decrease retropulsion.  Pt requires min A for car transfer at [place] height w/ cueing for hand placement.  Pt able to pick up cup from floor w/ min A and RW.  Pt amb to room w/ RW and min A.  Pt w/ noted fatigue.  Educated family on rest breaks for safety.  Pt amb into BR w/ RW and handed off to NT. ?   ? ?Therapy Documentation ?Precautions:  ?Precautions ?Precautions: Fall ?Precaution Comments: strong posterior bias (improved) ?Restrictions ?Weight Bearing Restrictions: No ?General: ?  ?Vital Signs: ?  ?Pain:0/10 ?Pain Assessment ?Pain Scale: 0-10 ?Pain Score: 0-No pain ?Pain Type: Acute pain ?Mobility: ?   ? ? ? ?Therapy/Group: Individual Therapy ? ?Ladoris Gene ?07/28/2021, 2:44 PM  ?

## 2021-07-28 NOTE — Progress Notes (Signed)
?                                                       PROGRESS NOTE ? ? ?Subjective/Complaints: ? ?Discussed d/c in am  ? ?ROS: limited due to language/communication  ? ?Objective: ?  ?No results found. ?No results for input(s): WBC, HGB, HCT, PLT in the last 72 hours. ? ? ?No results for input(s): NA, K, CL, CO2, GLUCOSE, BUN, CREATININE, CALCIUM in the last 72 hours. ? ? ? ?Intake/Output Summary (Last 24 hours) at 07/28/2021 0733 ?Last data filed at 07/28/2021 0700 ?Gross per 24 hour  ?Intake 360 ml  ?Output 400 ml  ?Net -40 ml  ? ?  ? ?  ? ?Physical Exam: ?Vital Signs ?Blood pressure 126/61, pulse 69, temperature 97.8 ?F (36.6 ?C), temperature source Oral, resp. rate 16, height '4\' 11"'$  (1.499 m), weight 54.5 kg, SpO2 97 %. ? ? ?General: No acute distress ?Mood and affect are appropriate ?Heart: Regular rate and rhythm no rubs murmurs or extra sounds ?Lungs: Clear to auscultation, breathing unlabored, no rales or wheezes ?Abdomen: Positive bowel sounds, soft nontender to palpation, nondistended ?Extremities: No clubbing, cyanosis, or edema ?Skin: No evidence of breakdown, no evidence of rash ? ? ?Skin: No evidence of breakdown, no evidence of rash ?Neuro: Alert, demonstrates reasonable comprehension ?Motor: RUE/RLE: 5/5 proximal distal ?LUE/LLE: 4 -/5 proximal distal with apraxia  ? ?Assessment/Plan: ?1. Functional deficits which require 3+ hours per day of interdisciplinary therapy in a comprehensive inpatient rehab setting. ?Physiatrist is providing close team supervision and 24 hour management of active medical problems listed below. ?Physiatrist and rehab team continue to assess barriers to discharge/monitor patient progress toward functional and medical goals ? ?Care Tool: ? ?Bathing ?   ?Body parts bathed by patient: Right arm, Left arm, Chest, Abdomen, Face  ? Body parts bathed by helper: Buttocks, Right lower leg, Left lower leg, Right upper leg, Left upper leg ?  ?  ?Bathing assist Assist Level:  Supervision/Verbal cueing ?  ?  ?Upper Body Dressing/Undressing ?Upper body dressing   ?What is the patient wearing?: Button up shirt ?   ?Upper body assist Assist Level: Maximal Assistance - Patient 25 - 49% ?   ?Lower Body Dressing/Undressing ?Lower body dressing ? ? ?   ?What is the patient wearing?: Pants ? ?  ? ?Lower body assist Assist for lower body dressing: Minimal Assistance - Patient > 75% ?   ? ?Toileting ?Toileting Toileting Activity did not occur (Probation officer and hygiene only): N/A (no void or bm)  ?Toileting assist Assist for toileting: Minimal Assistance - Patient > 75% ?  ?  ?Transfers ?Chair/bed transfer ? ?Transfers assist ?   ? ?Chair/bed transfer assist level: Minimal Assistance - Patient > 75% ?Chair/bed transfer assistive device: Armrests, Walker ?  ?Locomotion ?Ambulation ? ? ?Ambulation assist ? ? Ambulation activity did not occur: Safety/medical concerns ? ?Assist level: Minimal Assistance - Patient > 75% ?Assistive device: Walker-rolling ?Max distance: 51f  ? ?Walk 10 feet activity ? ? ?Assist ? Walk 10 feet activity did not occur: Safety/medical concerns ? ?Assist level: Contact Guard/Touching assist ?Assistive device: Walker-rolling  ? ?Walk 50 feet activity ? ? ?Assist Walk 50 feet with 2 turns activity did not occur: Safety/medical concerns ? ?  ?   ? ? ?Walk 150  feet activity ? ? ?Assist Walk 150 feet activity did not occur: Safety/medical concerns ? ?  ?  ?  ? ?Walk 10 feet on uneven surface  ?activity ? ? ?Assist Walk 10 feet on uneven surfaces activity did not occur: Safety/medical concerns ? ? ?  ?   ? ?Wheelchair ? ? ? ? ?Assist Is the patient using a wheelchair?: Yes ?Type of Wheelchair: Manual ?  ? ?Wheelchair assist level: Total Assistance - Patient < 25% ?Max wheelchair distance: 150  ? ? ?Wheelchair 50 feet with 2 turns activity ? ? ? ?Assist ? ?  ?  ? ? ?Assist Level: Total Assistance - Patient < 25%  ? ?Wheelchair 150 feet activity  ? ? ? ?Assist ?   ? ? ?Assist  Level: Total Assistance - Patient < 25%  ? ?Blood pressure 126/61, pulse 69, temperature 97.8 ?F (36.6 ?C), temperature source Oral, resp. rate 16, height '4\' 11"'$  (1.499 m), weight 54.5 kg, SpO2 97 %. ? ?Medical Problem List and Plan: ?1. 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 -Continue CIR therapies including PT, OT, and SLP  ?ELOS 3/10-with d/c to home , family assist . ? ? ?2.  Antithrombotics: ?-DVT/anticoagulation:  Pharmaceutical: Lovenox ?            -antiplatelet therapy: DAPT X 3 months followed by Plavix alone ?3. Left shoulder pain/Pain Management: Continue Sportscreme to left shoulder and tylenol at bedtime. ?--Tylenol prn.  ?-pt denies pain today ?4. Mood: LCSW to follow for evaluation and support.  ?            -antipsychotic agents: N/A ?5. Neuropsych: This patient is not fully capable of making decisions on his own behalf. ?6. Skin/Wound Care: Routine pressure relief measures.  ?7. Fluids/Electrolytes/Nutrition: Monitor I/Os ?8. HTN: Monitor BP TID--continue to monitor ?  ?Vitals:  ? 07/27/21 2029 07/28/21 0448  ?BP: (!) 141/57 126/61  ?Pulse: 78 69  ?Resp: 18 16  ?Temp: 98.4 ?F (36.9 ?C) 97.8 ?F (36.6 ?C)  ?SpO2: 98% 97%  ?  3/9 controlled ?9. CKD III: Baseline SCr 1.7 to 1.8 range-->ELevated since Oct 2022, no nephrotoxic meds identified  ?             ?BMP Latest Ref Rng & Units 07/25/2021 07/18/2021 07/11/2021  ?Glucose 70 - 99 mg/dL 119(H) 101(H) 103(H)  ?BUN 8 - 23 mg/dL 22 20 25(H)  ?Creatinine 0.61 - 1.24 mg/dL 1.78(H) 1.64(H) 1.70(H)  ?BUN/Creat Ratio 10 - 24 - - -  ?Sodium 135 - 145 mmol/L 140 140 138  ?Potassium 3.5 - 5.1 mmol/L 4.1 3.8 3.8  ?Chloride 98 - 111 mmol/L 109 107 103  ?CO2 22 - 32 mmol/L '23 25 25  '$ ?Calcium 8.9 - 10.3 mg/dL 8.5(L) 8.9 8.6(L)  ? Creatinine 1.78 on 3/6- in range ?10. Hyperactive bladder w/nocturia: Has incontinence has been an issue with urgency-->does not like depends. Nocturia decreased with merabegron plus low dose alpha  blocker  ? ?11.  RA: Has had shoulder pain on and off--uses tylenol prn. Also with left hip contracture which pt states is old xray shows mild OA - no active synovitis  ?12. Vitamin B 12 deficiency: B-12 341-->no on weekly supplement ?13. Pre-diabetes: Hgb A1C-5.8. Monitor FBS with weekly checks. ?14. Hypothyroid: Continue supplement.  ?15.  Slow transit constipation ? Improving ? 16.  Left hip contracture ROM per PT, Xray unremarkable  ? ?LOS: ?21 days ?A FACE TO FACE EVALUATION WAS PERFORMED ? ?Mitzi Hansen  E Mellissa Conley ?07/28/2021, 7:33 AM  ? ? ? ?

## 2021-07-28 NOTE — Plan of Care (Signed)
?  Problem: RH Cognition - SLP ?Goal: RH LTG Patient will demonstrate orientation with cues ?Description:  LTG:  Patient will demonstrate orientation to person/place/time/situation with cues (SLP)   ?Outcome: Completed/Met ?  ?Problem: RH Problem Solving ?Goal: LTG Patient will demonstrate problem solving for (SLP) ?Description: LTG:  Patient will demonstrate problem solving for basic/complex daily situations with cues  (SLP) ?Outcome: Completed/Met ?  ?Problem: RH Attention ?Goal: LTG Patient will demonstrate this level of attention during functional activites (SLP) ?Description: LTG:  Patient will will demonstrate this level of attention during functional activites (SLP) ?Outcome: Completed/Met ?  ?Problem: RH Awareness ?Goal: LTG: Patient will demonstrate awareness during functional activites type of (SLP) ?Description: LTG: Patient will demonstrate awareness during functional activites type of (SLP) ?Outcome: Completed/Met ?  ?

## 2021-07-28 NOTE — Progress Notes (Signed)
Patient ID: [name], male   DOB: Oct 16, 1928, 88 y.o.   MRN: [id number] ? ?This SW covering for primary SW, Erlene Quan. ? ?Reports from team indicated RW is too high and needs pediatric, also reports pt dtr would now like an aide. SW left message for [name]/[facility] [phone]) and waiting on follow-up. SW also requested pediatric RW with [facility] which will be swapped out today. ? ?Loralee Pacas, MSW, LCSWA ?Office: [phone] ?Cell: [phone] ?Fax: [phone]  ?

## 2021-07-28 NOTE — Progress Notes (Signed)
Physical Therapy Discharge Summary ? ?Patient Details  ?Name: [name] ?MRN: [id number] ?Date of Birth: [birth year] ? ?Today's Date: 07/29/2021 ? ? ? ?Patient has met 9 of 9 long term goals due to improved activity tolerance, improved balance, improved postural control, ability to compensate for deficits, functional use of  left upper extremity and left lower extremity, improved attention, improved awareness, and improved coordination.  Patient to discharge at an ambulatory level Min Assist to supervision assist with RW.   Patient's care partner is independent to provide the necessary physical and cognitive assistance at discharge. ? ?Reasons goals not met: all PT goals met.  ? ?Recommendation:  ?Patient will benefit from ongoing skilled PT services in home health setting to continue to advance safe functional mobility, address ongoing impairments in balance, transfers, gait, bed mobility, coordination, and minimize fall risk. ? ?Equipment: ?RW.  ? ?Reasons for discharge: treatment goals met and discharge from hospital ? ?Patient/family agrees with progress made and goals achieved: Yes ? ?PT Discharge ? ?Pain Interference ?Pain Interference ?Pain Effect on Sleep: 1. Rarely or not at all ?Pain Interference with Therapy Activities: 1. Rarely or not at all ?Pain Interference with Day-to-Day Activities: 1. Rarely or not at all ?Vision/Perception  ?Vision - History ?Ability to See in Adequate Light: 0 Adequate ?Vision - Assessment ?Eye Alignment: Within Functional Limits ?Ocular Range of Motion: Restricted on the left ?Perception ?Inattention/Neglect: Does not attend to left visual field ?Praxis ?Praxis Impairment Details: Motor planning;Ideomotor;Initiation  ?Cognition ?Overall Cognitive Status: Impaired/Different from baseline ?Arousal/Alertness: Awake/alert ?Orientation Level: Oriented X4 ?Year: 2023 ?Month: March ?Day of Week: Incorrect ?Attention: Sustained ?Sustained Attention: Impaired ?Sustained Attention  Impairment: Verbal basic;Functional basic ?Memory: Impaired ?Memory Impairment: Decreased recall of new information;Decreased short term memory ?Decreased Short Term Memory: Verbal basic;Functional basic ?Awareness Impairment: Intellectual impairment ?Problem Solving: Impaired ?Problem Solving Impairment: Verbal basic;Functional basic ?Safety/Judgment: Impaired ?Sensation ?Sensation ?Light Touch: Appears Intact ?Hot/Cold: Not tested ?Proprioception: Impaired Detail ?Stereognosis: Impaired Detail ?Additional Comments: mild L hemi LUE>LLE, and inattention ?Coordination ?Gross Motor Movements are Fluid and Coordinated: No ?Fine Motor Movements are Fluid and Coordinated: No ?Coordination and Movement Description: mild L  sided hemiplegia/dysmetria much improved since initial evaluation ?Finger Nose Finger Test: dysmetric on L ?Motor  ?Motor ?Motor: Hemiplegia ?Motor - Skilled Clinical Observations: L sided hemiplegia and dysmetria ?Motor - Discharge Observations: L sided hemiplegia and dysmetria (improved since initial eval)  ?Mobility ?Bed Mobility ?Bed Mobility: Supine to Sit ?Rolling Right: Contact Guard/Touching assist ?Rolling Left: Contact Guard/Touching assist ?Supine to Sit: Contact Guard/Touching assist ?Sit to Supine: Contact Guard/Touching assist ?Transfers ?Transfers: Sit to Stand;Stand to [employer] Transfers ?Sit to Stand: Supervision/Verbal cueing ?Stand to Sit: Supervision/Verbal cueing ?Stand Pivot Transfers: Contact Guard/Touching assist;Supervision/Verbal cueing ?Stand Pivot Transfer Details: Verbal cues for technique ?Locomotion  ?Gait ?Ambulation: Yes ?Gait Assistance: Contact Guard/Touching assist;Supervision/Verbal cueing ?Gait Distance (Feet): 150 Feet ?Assistive device: Rolling walker ?Gait ?Gait: Yes ?Gait Pattern: Impaired ?Gait Pattern: Step-through pattern;Step-to pattern;Decreased step length - left ?Stairs / Additional Locomotion ?Stairs: Yes ?Stairs Assistance: Minimal Assistance -  Patient > 75%;Contact Guard/Touching assist ?Stair Management Technique: Two rails ?Number of Stairs: 4 ?Height of Stairs: 8 ?Wheelchair Mobility ?Wheelchair Mobility: No  ?Trunk/Postural Assessment  ?Cervical Assessment ?Cervical Assessment: Exceptions to [facility] (forward head) ?Thoracic Assessment ?Thoracic Assessment: Exceptions to [facility] (rounded shoulders.) ?Lumbar Assessment ?Lumbar Assessment: Exceptions to [facility] (posterior pelvic tilt) ?Postural Control ?Postural Control: Deficits on evaluation ?Trunk Control: posterior bias although improved since initial evaluation mild improvement noted ?Righting Reactions: Delayed  ?  Balance ?Balance ?Balance Assessed: Yes ?Static Sitting Balance ?Static Sitting - Balance Support: Feet supported;No upper extremity supported ?Static Sitting - Level of Assistance: 5: Stand by assistance ?Dynamic Sitting Balance ?Dynamic Sitting - Balance Support: Feet supported;No upper extremity supported ?Dynamic Sitting - Level of Assistance: 5: Stand by assistance (CGA) ?Static Standing Balance ?Static Standing - Balance Support: Bilateral upper extremity supported ?Static Standing - Level of Assistance: 5: Stand by assistance (supervision A) ?Dynamic Standing Balance ?Dynamic Standing - Balance Support: Bilateral upper extremity supported ?Dynamic Standing - Level of Assistance: 5: Stand by assistance (CGA) ?Extremity Assessment  ?  ?  ?RLE Assessment ?RLE Assessment: Within Functional Limits ?General Strength Comments: grossly 4+/5 to 5/5 ?LLE Assessment ?LLE Assessment: Exceptions to [facility] ?General Strength Comments: still grossly 4-4+/5 ? ? ? ?Lorie Phenix ?07/29/2021, 7:50 AM ?

## 2021-07-28 NOTE — Progress Notes (Signed)
Occupational Therapy Discharge Summary ? ?Patient Details  ?Name: [name] ?MRN: [id number] ?Date of Birth: 18-Dec-1928 ? ?Today's Date: 07/28/2021 ?OT Individual Time: 1006-1100 ?OT Individual Time Calculation (min): 54 min  ? ? ?Patient has met 10 of 12 long term goals due to improved activity tolerance, improved balance, postural control, ability to compensate for deficits, functional use of  LEFT upper extremity, improved attention, improved awareness, and improved coordination.  Patient to discharge at overall CGA to [facility].  Patient to d/c back to ILF with elderly spouse and additional PRN supervision/assist from PCA and daughter to provide the necessary physical and cognitive assistance at discharge.   ? ?Reasons goals not met: Patient continues to require CGA to maintain dynamic sitting balance and requires Min A for bathing tasks at sink level vs. Shower.  ? ?Recommendation:  ?Patient will benefit from ongoing skilled OT services in home health setting to continue to advance functional skills in the area of BADL and Reduce care partner burden. ? ?Equipment: ?No equipment provided ? ?Reasons for discharge: discharge from hospital ? ?Patient/family agrees with progress made and goals achieved: Yes ? ?OT Discharge ?Precautions/Restrictions  ?Precautions ?Precautions: Fall ?Precaution Comments: strong posterior bias (improved) ?Restrictions ?Weight Bearing Restrictions: No ?General ?  ?Vital Signs ?Therapy Vitals ?Temp: 98.6 ?F (37 ?C) ?Temp Source: Oral ?Pulse Rate: 69 ?Resp: 16 ?BP: (!) 112/51 ?Patient Position (if appropriate): Sitting ?Oxygen Therapy ?SpO2: 97 % ?O2 Device: Room Air ?Pain ?Pain Assessment ?Pain Scale: 0-10 ?Pain Score: 0-No pain ?Pain Type: Acute pain ?ADL ?ADL ?Eating: Set up ?Where Assessed-Eating: Chair ?Grooming: Supervision/safety ?Where Assessed-Grooming: Standing at sink ?Upper Body Bathing: Supervision/safety ?Where Assessed-Upper Body Bathing: Edge of bed ?Lower Body Bathing:  Minimal assistance ?Where Assessed-Lower Body Bathing: Standing at sink ?Upper Body Dressing: Minimal assistance ?Where Assessed-Upper Body Dressing: Edge of bed ?Lower Body Dressing: Minimal assistance ?Where Assessed-Lower Body Dressing: Sitting at sink ?Toileting: Contact guard ?Toilet Transfer: Close supervision ?Toilet Transfer Method: Ambulating ?Tub/Shower Transfer: Close supervison ?Tub/Shower Transfer Method: Ambulating ?Walk-In Shower Transfer: Not assessed ?Vision ?Baseline Vision/History: 1 Wears glasses ?Patient Visual Report: No change from baseline ?Vision Assessment?: Yes ?Eye Alignment: Within Functional Limits ?Ocular Range of Motion: Restricted on the left ?Alignment/Gaze Preference: Within Defined Limits ?Tracking/Visual Pursuits: Requires cues, head turns, or add eye shifts to track;Impaired - to be further tested in functional context;Decreased smoothness of eye movement to LEFT superior field;Decreased smoothness of eye movement to LEFT inferior field ?Saccades: Additional eye shifts occurred during testing;Decreased speed of saccadic movement;Additional head turns occurred during testing ?Convergence: Impaired (comment) ?Visual Fields: No apparent deficits ?Depth Perception: Undershoots ?Perception  ?Perception: Impaired ?Inattention/Neglect: Does not attend to left visual field ?Praxis ?Praxis: Impaired ?Praxis Impairment Details: Motor planning;Ideomotor;Initiation ?Praxis-Other Comments: Apraxia (much improved since initial evaluation) ?Cognition ?Overall Cognitive Status: Impaired/Different from baseline (Baseline deficits) ?Arousal/Alertness: Awake/alert ?Orientation Level: Oriented X4 ?Year: 2023 ?Month: March ?Day of Week: Incorrect ?Attention: Sustained ?Sustained Attention: Impaired ?Sustained Attention Impairment: Verbal basic;Functional basic ?Memory: Impaired ?Memory Impairment: Decreased recall of new information;Decreased short term memory ?Decreased Short Term Memory: Verbal  basic;Functional basic ?Immediate Memory Recall: Sock;Blue ?Memory Recall Sock: Without Cue ?Memory Recall Blue: Without Cue ?Memory Recall Bed: Not able to recall ?Awareness: Impaired ?Awareness Impairment: Intellectual impairment ?Problem Solving: Impaired ?Problem Solving Impairment: Verbal basic;Functional basic ?Behaviors: Perseveration ?Safety/Judgment: Impaired ?Comments: Difficulty fully assessing 2/2 language barrier, pt often tangential with speech ?Sensation ?Sensation ?Light Touch: Appears Intact ?Hot/Cold: Not tested ?Proprioception: Impaired Detail ?Stereognosis: Impaired Detail ?Additional Comments: mild L  hemi LUE>LLE, and inattention ?Coordination ?Gross Motor Movements are Fluid and Coordinated: No ?Fine Motor Movements are Fluid and Coordinated: No ?Coordination and Movement Description: mild L  sided hemiplegia/dysmetria much improved since initial evaluation ?Finger Nose Finger Test: dysmetric on L ?Motor  ?Motor ?Motor: Hemiplegia;Motor apraxia ?Motor - Discharge Observations: L sided hemiplegia and dysmetria (improved since initial eval) ?Mobility  ?Bed Mobility ?Bed Mobility: Supine to Sit ?Rolling Right: Contact Guard/Touching assist ?Rolling Left: Contact Guard/Touching assist ?Supine to Sit: Contact Guard/Touching assist ?Sit to Supine: Contact Guard/Touching assist ?Transfers ?Sit to Stand: Supervision/Verbal cueing ?Stand to Sit: Supervision/Verbal cueing  ?Trunk/Postural Assessment  ?Cervical Assessment ?Cervical Assessment: Exceptions to [facility] (forward head) ?Thoracic Assessment ?Thoracic Assessment: Exceptions to [facility] (rounded shoulders.) ?Lumbar Assessment ?Lumbar Assessment: Exceptions to [facility] (posterior pelvic tilt) ?Postural Control ?Postural Control: Deficits on evaluation ?Trunk Control: posterior bias although improved since initial evaluation ?Righting Reactions: Delayed  ?Balance ?Balance ?Balance Assessed: Yes ?Static Sitting Balance ?Static Sitting - Balance Support: Feet  supported;No upper extremity supported ?Static Sitting - Level of Assistance: 5: Stand by assistance ?Dynamic Sitting Balance ?Dynamic Sitting - Balance Support: Feet supported;No upper extremity supported ?Dynamic Sitting - Level of Assistance: 5: Stand by assistance (CGA) ?Static Standing Balance ?Static Standing - Balance Support: Bilateral upper extremity supported ?Static Standing - Level of Assistance: 5: Stand by assistance (supervision A) ?Dynamic Standing Balance ?Dynamic Standing - Balance Support: Bilateral upper extremity supported ?Dynamic Standing - Level of Assistance: 5: Stand by assistance (CGA) ?Extremity/Trunk Assessment ?RUE Assessment ?RUE Assessment: Within Functional Limits ?LUE Assessment ?LUE Assessment: Exceptions to [facility] ?General Strength Comments: 4-/5 throughout, mild dysmetria and inattention but able to incorporate into bimanual ADL ? ? ?Alethea Terhaar R Howerton-Davis ?07/28/2021, 3:05 PM ?

## 2021-07-28 NOTE — Discharge Summary (Signed)
Physician Discharge Summary  Patient ID: [name] MRN: [id number] DOB/AGE: 1928-10-17 88 y.o.  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  Labs:  Basic Metabolic Panel: BMP Latest Ref Rng & Units 07/25/2021 07/18/2021 07/11/2021  Glucose 70 - 99 mg/dL 119(H) 101(H) 103(H)  BUN 8 - 23 mg/dL 22 20 25(H)  Creatinine 0.61 - 1.24 mg/dL 1.78(H) 1.64(H) 1.70(H)  BUN/Creat Ratio 10 - 24 - - -  Sodium 135 - 145 mmol/L 140 140 138  Potassium 3.5 - 5.1 mmol/L 4.1 3.8 3.8  Chloride 98 - 111 mmol/L 109 107 103  CO2 22 - 32 mmol/L '23 25 25  ' Calcium 8.9 - 10.3 mg/dL 8.5(L) 8.9 8.6(L)     CBC: CBC Latest Ref Rng & Units 07/25/2021 07/18/2021 07/11/2021  WBC 4.0 - 10.5 K/uL 8.1 9.6 9.0  Hemoglobin 13.0 - 17.0 g/dL 11.6(L) 12.0(L) 11.0(L)  Hematocrit 39.0 - 52.0 % 36.4(L) 36.6(L) 34.0(L)  Platelets 150 - 400 K/uL 206 234 226     CBG: No results for input(s): GLUCAP in the last 168 hours.  Brief HPI:   [name] is a 88 y.o. male with history of HTN, colon cancer, gait disorder with frequent falls, prior CVA 07/19, CKD III who was admitted on 06/30/2021 with reports of recurrent fall as well as left-sided weakness of few days duration.  MRI/MRI brain done revealing small acute perforated infarct in the right corona radiata and right M1 high-grade stenosis.  Dr. Erlinda Hong felt the stroke was due to small vessel disease but question embolic phenomena causing new high-grade M1 stenosis.  He recommended 30-day event monitor after discharge to rule out A-fib.  Aspirin was increased to 325 mg with  Plavix added and recommendations are to continue DAPT x3 months followed by Plavix alone.  Therapy was initiated and patient was noted to be limited by left-sided weakness with ataxia, balance deficits as well as expressive deficits with delay in processing.  CIR was recommended due to functional decline.   Hospital Course: [name] was admitted to rehab 07/07/2021 for inpatient therapies to consist of PT, ST and OT at least three hours five days a week. Past admission physiatrist, therapy team and rehab RN have worked together to provide customized collaborative inpatient rehab.  His blood pressures were monitored on TID basis and have been reasonably controlled.  Bowel program was augmented to help manage constipation. He was maintained on HH/CM diet and fasting BS were noted to be controlled on serial check of BMET.  Check of lytes  showed acute on chronic renal failure has improved with increase in fluid intake and SCr is at baseline.   He was maintained on weekly B-12 injections due to low normal levels and recommend follow up with PCP for further supplementation. Tylenol was scheduled at bedtime to help with pain management in addition to low-dose melatonin as needed to help with sleep-wake cycle.  Capsaicin cream was added to help manage left shoulder pain.  Physical therapy has worked on range of motion of left hip to help with contracture.  He continues on DAPT with follow-up CBC  showing H&H and platelets to be stable. He has made steady gains during his stay with improvement in cognitive ability as well as functional status and family has hired supervision at his [place] after discharge. Marland Kitchen He currently requires CGA to supervision and recommend follow up HHPT, HHOT as well as [place] aide post discharge.    Rehab course: During patient's stay in rehab weekly team conferences were held to monitor patient's progress, set goals and discuss barriers to discharge. At admission, patient required total assist with  ADL tasks and max assist with mobility.  He exhibited tangential speech and language of confusion, decreased attention and lacked awareness of current deficits.  Voice was noted to be hoarse sounding.  He has had improvement in use of LUE, attention, coordination, activity tolerance, balance as well as ability to compensate for deficits.  He is able to complete ADL tasks with contact-guard to min assist.  He requires min assist with verbal cues for bed mobility and min to contact-guard assist to ambulate 150 feet with RW.   He has made excellent gains in the areas of orientation, attention problem solving as well as emergent awareness and requires supervision for cognitive tasks.  Family education was completed regarding all aspects of safety and care.  Discharge disposition: 01-Home or Self Care  Diet: Heart healthy carb modified.  Special Instructions: Recommend repeat CBC in 2 to 3 weeks to monitor H&H while on DAPT. 2.  B12 supplementation per PCP. 3.  Continue DAPT x3 months followed by Plavix alone.    Discharge Instructions     Ambulatory referral to Cardiology   Complete by: As directed    30 day event monitor for stroke work up   Ambulatory referral to Physical Medicine Rehab   Complete by: As directed    Post hospital follow up      Allergies as of 07/29/2021       Reactions   Methotrexate Derivatives Other (See Comments)   Mouth sores / pancytopenia   Asa [aspirin] Other (See Comments)   Gastric symptoms - can tolerate 81 mg aspirin   Penicillin G Rash   Did it involve swelling of the face/tongue/throat, SOB, or low BP? Unknown Did it involve sudden or severe rash/hives, skin peeling, or any reaction on the inside of your mouth or nose? Unknown Did you need to seek medical attention at a hospital or doctor's office? Unknown When did it last happen?   unknown    If all above answers are NO, may proceed with cephalosporin use.        Medication List     STOP  taking these medications    cyanocobalamin 1000 MCG/ML injection Commonly known as: (VITAMIN B-12)   traMADol 50 MG tablet Commonly known as: ULTRAM       TAKE these medications    acetaminophen 325 MG tablet Commonly known as: TYLENOL Take 1-2 tablets (325-650 mg total) by mouth every 4 (four) hours as needed for mild pain.   acetaminophen 500 MG tablet Commonly known as: TYLENOL Take 1 tablet (500 mg total) by mouth at bedtime. Notes to patient: Take one daily at bedtime to help with pain/sleep.     aspirin 325 MG EC tablet Take 1 tablet (325 mg total) by mouth daily. Notes to patient: Note that this is a full strength coated aspirin. Purchase this over the counter   atorvastatin 40 MG tablet Commonly known as: LIPITOR Take 1 tablet (40 mg total) by mouth daily.  capsicum 0.075 % topical cream Commonly known as: ZOSTRIX Apply 1 application. topically 4 (four) times daily -  with meals and at bedtime. Be sure to wear glove and do not touch eye, mouth or nose (any mucous membranes) Apply to left shoulder.   clopidogrel 75 MG tablet Commonly known as: PLAVIX Take 1 tablet (75 mg total) by mouth daily.   docusate sodium 100 MG capsule Commonly known as: COLACE Take 2 capsules (200 mg total) by mouth daily.   doxazosin 1 MG tablet Commonly known as: CARDURA Take 1 tablet (1 mg total) by mouth at bedtime.   guaiFENesin 600 MG 12 hr tablet Commonly known as: MUCINEX Take 600 mg by mouth 2 (two) times daily as needed for cough or to loosen phlegm.   levothyroxine 100 MCG tablet Commonly known as: SYNTHROID Take 1 tablet (100 mcg total) by mouth daily before breakfast.   loratadine 10 MG tablet Commonly known as: CLARITIN Take 1 tablet (10 mg total) by mouth daily as needed for allergies. What changed:  when to take this reasons to take this   melatonin 3 MG Tabs tablet Take 1 tablet (3 mg total) by mouth at bedtime as needed.   mirabegron ER 25 MG Tb24  tablet Commonly known as: MYRBETRIQ Take 25 mg by mouth at bedtime.   Multivitamin Adult Chew Chew 2 each by mouth daily.   NONFORMULARY OR COMPOUNDED ITEM 4 wheeled walker   #1  dx frequent falls, weakness   pantoprazole 40 MG tablet Commonly known as: PROTONIX Take 1 tablet (40 mg total) by mouth daily.   polyvinyl alcohol 1.4 % ophthalmic solution Commonly known as: LIQUIFILM TEARS Place 1 drop into both eyes daily as needed for dry eyes.        Follow-up Information     Kirsteins, Luanna Salk, MD Follow up.   Specialty: Physical Medicine and Rehabilitation Why: office will call you with follow up appointment Contact information: [place] [address] [phone]         [facility] Follow up.   Why: office will call you with follow up appointment Contact information: [address]     [address] [place] [address] [place], Alferd Apa, DO. Call.   Specialty: Family Medicine Why: for post hospital follow up Contact information: [address] [place] [address] [phone]         [place] Follow up.   Why: call for follow up appointment Contact information: [place] [address] [phone]                 Signed: Bary Leriche 07/31/2021, 5:47 PM

## 2021-07-28 NOTE — Progress Notes (Signed)
Speech Language Pathology Discharge Summary ? ?Patient Details  ?Name: [name] ?MRN: [id number] ?Date of Birth: 27-Dec-1928 ? ?Today's Date: 07/28/2021 ?SLP Individual Time: 7939-0300 ?SLP Individual Time Calculation (min): 45 min ? ?Skilled Therapeutic Interventions: Skilled ST treatment focused on cognitive goals. SLP facilitated session by providing overall sup-to-min A for abstract reasoning by comparing and contrasting common/familiar items. Pt required intermittent verbal redirection for topic maintenance, however many comments and stories were appropriate with present item/topic being discussed. Patient was left in recliner with alarm activated and immediate needs within reach at end of session. Continue per current plan of care.   ? ?Patient has met 4 of 4 long term goals.  Patient to discharge at overall Supervision level.  ?Reasons goals not met: All goals met  ? ?Clinical Impression/Discharge Summary: Patient has made excellent gains and has met 4 of 4 long-term goals this admission due to improved cognitive-linguistic skills in the areas of orientation, sustained attention, basic problem solving, and intellectual and emergent awareness. Patient is currently an overall supervision level assist for cognitive tasks and will require caregiver to manage all iADLs. Patient and family education is complete and patient to discharge at overall supervision level. Patient's care partner is independent to provide the necessary physical and cognitive assistance at discharge. Follow up SLP services to not appear indicated at this time, as suspect patient is near/at baseline function. ? ?Care Partner:  ?Caregiver Able to Provide Assistance: Yes  ?Type of Caregiver Assistance: Physical;Cognitive ? ?Recommendation:  ?24 hour supervision/assistance  ?Rationale for SLP Follow Up: Other (comment) (no follow up necessary)  ? ?Equipment: None  ? ?Reasons for discharge: Discharged from hospital;Treatment goals met   ? ?Patient/Family Agrees with Progress Made and Goals Achieved: Yes  ? ? ?[name] ?07/28/2021, 4:26 PM ? ?

## 2021-07-28 NOTE — Patient Care Conference (Signed)
Inpatient RehabilitationTeam Conference and Plan of Care Update ?Date: 07/27/2021   Time: 10:15 AM  ? ? ?Patient Name: [name]      ?Medical Record Number: [id number]  ?Date of Birth: Apr 24, 1929 ?Sex: Male         ?Room/Bed: 4M06C/4M06C-01 ?Payor Info: Payor: [employer] / Plan: [facility] MEDICARE / Product Type: *No Product type* /   ? ?Admit Date/Time:  07/07/2021  2:48 PM ? ?Primary Diagnosis:  Stroke (cerebrum) ([place]) ? ?Hospital Problems: Principal Problem: ?  Stroke (cerebrum) ([facility]) ?Active Problems: ?  Slow transit constipation ? ? ? ?Expected Discharge Date: Expected Discharge Date: 07/29/21 ? ?Team Members Present: ?Physician leading conference: Dr. Alysia Penna ?Social Worker Present: Erlene Quan, BSW ?Nurse Present: Dorien Chihuahua, RN ?PT Present: Barrie Folk, PT ?OT Present: Lillia Corporal, OT ?SLP Present: Sherren Kerns, SLP ?PPS Coordinator present : Gunnar Fusi, SLP ? ?   Current Status/Progress Goal Weekly Team Focus  ?Bowel/Bladder ? ? continent b/b. uses urinal with assistance. last bm 3/7  maintain continence  assess toileting needs every shift and PRN   ?Swallow/Nutrition/ Hydration ? ?           ?ADL's ? ? Min for ambulating to/from bathroom, Min toileting, Mod LB bathe/dress (all per CARETOOL)  min A LBD, shower transfer goals; CGA toilet t/fs/toileting, S remaining ADL  LUE NMR, ADL transfer training, pt and family training, DC planning   ?Mobility ? ? Bed mobility = MinA; Transfers = MinA/ CGA/ supervision at times; Gait = up to 200' (with fatigue and quality breakdown) using RW and CGA with vc throughout for increasing L step height/ length  Bed mobility with MinA; Transfers with MinA; Ambulation with CGA/ MinA using LRAD, w/c mobility with CGA  continued standing balance/ anterior weight shift, L NMR, L attention, initiation, safety awareness, transfers, gait, family education   ?Communication ? ?           ?Safety/Cognition/ Behavioral Observations ? sup A  sup A for  orientation with external aids, sustained attention, intellectual/emergent awareness, sup-to-min A for problem solving  problem solving, executive functions, attention, orientation   ?Pain ? ? mild left shoulder pain. on  scheduled sportscreme and tylenol prn  pain score <3/10  assess pain evry shift and PRN   ?Skin ? ?    skin remains intact  change positions  and turning Q 2H while on bed   ? ? ?Discharge Planning:  ?Discharging back to ALF on 3/10. All Reccs sent to the facility.   ?Team Discussion: ?Patient with inattention and needs supervision for basic cognition, orientation, problem solving and awareness of deficits post stroke.  ?Patient on target to meet rehab goals: ?yes, currently needs min assist for toileting and mod assist for transfers but able to ambulate up to 200' with supervision. Posterior bias continues but improves with repetition and time. ? ?*See Care Plan and progress notes for long and short-term goals.  ? ?Revisions to Treatment Plan:  ?N/A  ?Teaching Needs: ?Safety, medication management, secondary risk management, etc  ?Current Barriers to Discharge: ?Decreased caregiver support ? ?Possible Resolutions to Barriers: ?Family education; 24/7 supervision recommended ?HH follow up services ?DME: Shower seat ?  ? ? Medical Summary ?Current Status: intermittent elevation of BP early am only ? Barriers to Discharge: Medical stability ?  ?Possible Resolutions to [employer] Focus: cont BP med adjustment , monitor renal status ? ? ?Continued Need for Acute Rehabilitation Level of Care: The patient requires daily medical management by  a physician with specialized training in physical medicine and rehabilitation for the following reasons: ?Direction of a multidisciplinary physical rehabilitation program to maximize functional independence : Yes ?Medical management of patient stability for increased activity during participation in an intensive rehabilitation regime.: Yes ?Analysis of laboratory  values and/or radiology reports with any subsequent need for medication adjustment and/or medical intervention. : Yes ? ? ?I attest that I was present, lead the team conference, and concur with the assessment and plan of the team. ? ? ?Dorien Chihuahua B ?07/28/2021, 9:08 AM  ? ? ? ? ? ? ?

## 2021-07-28 NOTE — Consult Note (Signed)
? ?  [facility] CM Inpatient Consult ? ? ?07/28/2021 ? ?Diane Mochizuki ?Oct 06, 1928 ?[id number] ? ?[place] Organization [ACO] Patient: [employer] ? ?Primary Care Provider:  Carollee Herter, Alferd Apa, DO, [place], [facility], is an embedded provider with a Chronic Care Management team and program, and is listed for the transition of care follow up and appointments. ? ?Patient was screened for Embedded practice service needs for chronic care management has been active with Embedded RNCM and CCM program. ? ?Plan: Notification sent to the [facility] Management and made aware of TOC needs for post hospital needs. ? ?Please contact for further questions, ? ?Natividad Brood, RN BSN CCM ?[facility] Liaison ? [phone] business mobile phone ?Toll free office [phone]  ?Fax number: [phone] ?[email or web address] ? ? ? ?

## 2021-07-29 DIAGNOSIS — M25512 Pain in left shoulder: Secondary | ICD-10-CM

## 2021-07-29 MED ORDER — LORATADINE 10 MG PO TABS: 10.0000 mg | ORAL_TABLET | Freq: Every day | ORAL | Status: DC | PRN

## 2021-07-29 MED ORDER — DOCUSATE SODIUM 100 MG PO CAPS: 200.0000 mg | ORAL_CAPSULE | Freq: Every day | ORAL | 0 refills | Status: AC

## 2021-07-29 MED ORDER — MELATONIN 3 MG PO TABS: 3.0000 mg | ORAL_TABLET | Freq: Every evening | ORAL | 0 refills | Status: DC | PRN

## 2021-07-29 MED ORDER — ACETAMINOPHEN 500 MG PO TABS: 500.0000 mg | ORAL_TABLET | Freq: Every day | ORAL | 0 refills | Status: AC

## 2021-07-29 MED ORDER — ASPIRIN 325 MG PO TBEC: 325.0000 mg | DELAYED_RELEASE_TABLET | Freq: Every day | ORAL | 0 refills | Status: DC

## 2021-07-29 MED ORDER — CAPSAICIN 0.075 % EX CREA: 1.0000 "application " | TOPICAL_CREAM | Freq: Three times a day (TID) | CUTANEOUS | 0 refills | Status: AC

## 2021-07-29 MED ORDER — CLOPIDOGREL BISULFATE 75 MG PO TABS: 75.0000 mg | ORAL_TABLET | Freq: Every day | ORAL | 0 refills | Status: DC

## 2021-07-29 MED ORDER — DOXAZOSIN MESYLATE 1 MG PO TABS: 1.0000 mg | ORAL_TABLET | Freq: Every day | ORAL | 0 refills | Status: DC

## 2021-07-29 MED ORDER — ATORVASTATIN CALCIUM 40 MG PO TABS: 40.0000 mg | ORAL_TABLET | Freq: Every day | ORAL | 0 refills | Status: DC

## 2021-07-29 NOTE — Progress Notes (Signed)
Inpatient Rehabilitation Care Coordinator ?Discharge Note  ? ?Patient Details  ?Name: [name] ?MRN: [id number] ?Date of Birth: [birth year] ? ? ?Discharge location: Home ? ?Length of Stay: 22 Days ? ?Discharge activity level: sup/Min A ? ?Home/community participation: daughter ? ?Patient response CB:ULAGTX Literacy - How often do you need to have someone help you when you read instructions, pamphlets, or other written material from your doctor or pharmacy?: Never ? ?Patient response MI:WOEHOZ Isolation - How often do you feel lonely or isolated from those around you?: Never ? ?Services provided included: SW, Neuropsych, Pharmacy, TR, CM, RN, SLP, OT, PT, RD, MD ? ?Financial Services:  ?Charity fundraiser Utilized: Private Insurance ?[employer] ? ?Choices offered to/list presented to: patient daughter ? ?Follow-up services arranged:  ?Home Health, DME ?Home Health Agency: ILF/ALF  ?  ?DME : [employer] [facility] for pediatric RW, and 3in1 BSC ?  ? ?Patient response to transportation need: ?Is the patient able to respond to transportation needs?: Yes ?In the past 12 months, has lack of transportation kept you from medical appointments or from getting medications?: No ?In the past 12 months, has lack of transportation kept you from meetings, work, or from getting things needed for daily living?: No ? ? ?Comments (or additional information): ? ?Patient/Family verbalized understanding of follow-up arrangements:  Yes ? ?Individual responsible for coordination of the follow-up plan: [name] [phone] ? ?Confirmed correct DME delivered: Rana Snare 07/29/2021   ? ?Rana Snare ?

## 2021-07-29 NOTE — Progress Notes (Signed)
INPATIENT REHABILITATION DISCHARGE NOTE ? ? ?Discharge instructions by: Jeannene Patella PA ? ?Verbalized understanding:yes ? ?Skin care/Wound care healing?none ? ?Pain:addressed ? ?IV's:none ? ?Tubes/Drains:none ? ?O2:none ? ?Safety instructions:done ? ?Patient belongings:done ? ?Discharged XJ:OITG ? ?Discharged PQD:IYMEBRAXEN ? ?Notes:daughter in room for discharge instructions ? ? ? ? ? ? ?  ?

## 2021-07-29 NOTE — Plan of Care (Signed)
?  Problem: RH Balance ?Goal: LTG Patient will maintain dynamic sitting balance (PT) ?Description: LTG:  Patient will maintain dynamic sitting balance with assistance during mobility activities (PT) ?Outcome: Completed/Met ?Goal: LTG Patient will maintain dynamic standing balance (PT) ?Description: LTG:  Patient will maintain dynamic standing balance with assistance during mobility activities (PT) ?Outcome: Completed/Met ?  ?Problem: Sit to Stand ?Goal: LTG:  Patient will perform sit to stand with assistance level (PT) ?Description: LTG:  Patient will perform sit to stand with assistance level (PT) ?Outcome: Completed/Met ?  ?Problem: RH Bed Mobility ?Goal: LTG Patient will perform bed mobility with assist (PT) ?Description: LTG: Patient will perform bed mobility with assistance, with/without cues (PT). ?Outcome: Completed/Met ?  ?Problem: RH Bed to Chair Transfers ?Goal: LTG Patient will perform bed/chair transfers w/assist (PT) ?Description: LTG: Patient will perform bed to chair transfers with assistance (PT). ?Outcome: Completed/Met ?  ?Problem: RH Car Transfers ?Goal: LTG Patient will perform car transfers with assist (PT) ?Description: LTG: Patient will perform car transfers with assistance (PT). ?Outcome: Completed/Met ?  ?Problem: RH Ambulation ?Goal: LTG Patient will ambulate in controlled environment (PT) ?Description: LTG: Patient will ambulate in a controlled environment, # of feet with assistance (PT). ?Outcome: Completed/Met ?Goal: LTG Patient will ambulate in home environment (PT) ?Description: LTG: Patient will ambulate in home environment, # of feet with assistance (PT). ?Outcome: Completed/Met ?  ?Problem: RH Wheelchair Mobility ?Goal: LTG Patient will propel w/c in controlled environment (PT) ?Description: LTG: Patient will propel wheelchair in controlled environment, # of feet with assist (PT) ?Outcome: Completed/Met ?  ?

## 2021-07-29 NOTE — Progress Notes (Addendum)
Patient ID: [name], male   DOB: April 06, 1929, 88 y.o.   MRN: [id number] ? ?This SW covering for primary SW, Erlene Quan. ? ?SW called pt dtr [name] [phone]) at request due to concerns related to having an aide at discharge, bed rails, and Speech therapy at discharge. SW shared based on the EMR pt will have speech therapy. SW informed bed rails are a private purchase item, and SW can order a bedside commode and this item will be delivered to the home. Address: [address], [place], [place] [address]. SW shared will have to speak with liaison with [facility] to request an aide as SW called liaison yesterday and waiting on follow-up. SW shared will provide updates once available.  ? ?SW left message for Regina/[facility] [phone]) and waiting on follow-up.  ?*Sw spoke with Rollene Fare who reported pt dtr would need to speak with Andee Poles in office to discuss senior living options as an aide will be private pay. SW verified HH therapies she has. Will need SLP order. SW spoke with tp dtr [name] to discuss above. No further questions/concerns. ? ?SW faxed SLP order.  ? ?Loralee Pacas, MSW, LCSWA ?Office: [phone] ?Cell: [phone] ?Fax: [phone]  ?

## 2021-07-29 NOTE — Progress Notes (Signed)
?                                                       PROGRESS NOTE ? ? ?Subjective/Complaints: ? ?Slept well no c/o this am  ? ?ROS: limited due to language/communication  ? ?Objective: ?  ?No results found. ?No results for input(s): WBC, HGB, HCT, PLT in the last 72 hours. ? ? ?No results for input(s): NA, K, CL, CO2, GLUCOSE, BUN, CREATININE, CALCIUM in the last 72 hours. ? ? ? ?Intake/Output Summary (Last 24 hours) at 07/29/2021 0823 ?Last data filed at 07/28/2021 1821 ?Gross per 24 hour  ?Intake 480 ml  ?Output --  ?Net 480 ml  ? ?  ? ?  ? ?Physical Exam: ?Vital Signs ?Blood pressure (!) 138/55, pulse 63, temperature 98.4 ?F (36.9 ?C), resp. rate 18, height '4\' 11"'$  (1.499 m), weight 54.8 kg, SpO2 97 %. ? ? ?General: No acute distress ?Mood and affect are appropriate ?Heart: Regular rate and rhythm no rubs murmurs or extra sounds ?Lungs: Clear to auscultation, breathing unlabored, no rales or wheezes ?Abdomen: Positive bowel sounds, soft nontender to palpation, nondistended ?Extremities: No clubbing, cyanosis, or edema ?Skin: No evidence of breakdown, no evidence of rash ? ? ?Skin: No evidence of breakdown, no evidence of rash ?Neuro: Alert, demonstrates reasonable comprehension ?Motor: RUE/RLE: 5/5 proximal distal ?LUE/LLE: 4 -/5 proximal distal with apraxia  ? ?Assessment/Plan: ?1. Functional deficits due to R CVA ?Stable for D/C today ?F/u PCP in 3-4 weeks ?F/u PM&R 2 weeks ?See D/C summary ?See D/C instructions  ? ?Care Tool: ? ?Bathing ?   ?Body parts bathed by patient: Right arm, Left arm, Chest, Abdomen, Face, Front perineal area, Buttocks, Right upper leg, Left upper leg  ? Body parts bathed by helper: Right lower leg, Left lower leg ?  ?  ?Bathing assist Assist Level: Minimal Assistance - Patient > 75% ?  ?  ?Upper Body Dressing/Undressing ?Upper body dressing   ?What is the patient wearing?: Pull over shirt ?   ?Upper body assist Assist Level: Supervision/Verbal cueing ?   ?Lower Body  Dressing/Undressing ?Lower body dressing ? ? ?   ?What is the patient wearing?: Underwear/pull up, Pants ? ?  ? ?Lower body assist Assist for lower body dressing: Minimal Assistance - Patient > 75% ?   ? ?Toileting ?Toileting Toileting Activity did not occur (Probation officer and hygiene only): N/A (no void or bm)  ?Toileting assist Assist for toileting: Minimal Assistance - Patient > 75% ?  ?  ?Transfers ?Chair/bed transfer ? ?Transfers assist ?   ? ?Chair/bed transfer assist level: Contact Guard/Touching assist ?Chair/bed transfer assistive device: Armrests, Walker ?  ?Locomotion ?Ambulation ? ? ?Ambulation assist ? ? Ambulation activity did not occur: Safety/medical concerns ? ?Assist level: Contact Guard/Touching assist ?Assistive device: Walker-rolling ?Max distance: 17'  ? ?Walk 10 feet activity ? ? ?Assist ? Walk 10 feet activity did not occur: Safety/medical concerns ? ?Assist level: Contact Guard/Touching assist ?Assistive device: Walker-rolling  ? ?Walk 50 feet activity ? ? ?Assist Walk 50 feet with 2 turns activity did not occur: Safety/medical concerns ? ?Assist level: Contact Guard/Touching assist ?Assistive device: Walker-rolling  ? ? ?Walk 150 feet activity ? ? ?Assist Walk 150 feet activity did not occur: Safety/medical concerns ? ?Assist level: Supervision/Verbal cueing ?  ?  ? ?  Walk 10 feet on uneven surface  ?activity ? ? ?Assist Walk 10 feet on uneven surfaces activity did not occur: Safety/medical concerns ? ? ?Assist level: Minimal Assistance - Patient > 75% ?   ? ?Wheelchair ? ? ? ? ?Assist Is the patient using a wheelchair?: No ?Type of Wheelchair: Manual ?Wheelchair activity did not occur: N/A ? ?Wheelchair assist level: Total Assistance - Patient < 25% ?Max wheelchair distance: 150  ? ? ?Wheelchair 50 feet with 2 turns activity ? ? ? ?Assist ? ?  ?Wheelchair 50 feet with 2 turns activity did not occur: N/A ? ? ?Assist Level: Total Assistance - Patient < 25%  ? ?Wheelchair 150 feet  activity  ? ? ? ?Assist ? Wheelchair 150 feet activity did not occur: N/A ? ? ?Assist Level: Total Assistance - Patient < 25%  ? ?Blood pressure (!) 138/55, pulse 63, temperature 98.4 ?F (36.9 ?C), resp. rate 18, height '4\' 11"'$  (1.499 m), weight 54.8 kg, SpO2 97 %. ? ?Medical Problem List and Plan: ?1. 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 -Continue CIR therapies including PT, OT, and SLP  ?ELOS 3/10-with d/c to home , family assist . ? ? ?2.  Antithrombotics: ?-DVT/anticoagulation:  Pharmaceutical: Lovenox ?            -antiplatelet therapy: DAPT X 3 months followed by Plavix alone ?3. Left shoulder pain/Pain Management: Continue Sportscreme to left shoulder and tylenol at bedtime. ?--Tylenol prn.  ?-pt denies pain today ?4. Mood: LCSW to follow for evaluation and support.  ?            -antipsychotic agents: N/A ?5. Neuropsych: This patient is not fully capable of making decisions on his own behalf. ?6. Skin/Wound Care: Routine pressure relief measures.  ?7. Fluids/Electrolytes/Nutrition: Monitor I/Os ?8. HTN: Monitor BP TID--continue to monitor ?  ?Vitals:  ? 07/28/21 2003 07/29/21 0344  ?BP: 132/64 (!) 138/55  ?Pulse: 68 63  ?Resp: 17 18  ?Temp: 98.2 ?F (36.8 ?C) 98.4 ?F (36.9 ?C)  ?SpO2: 98% 97%  ?  3/10 controlled ?9. CKD III: Baseline SCr 1.7 to 1.8 range-->ELevated since Oct 2022, no nephrotoxic meds identified  ?             ?BMP Latest Ref Rng & Units 07/25/2021 07/18/2021 07/11/2021  ?Glucose 70 - 99 mg/dL 119(H) 101(H) 103(H)  ?BUN 8 - 23 mg/dL 22 20 25(H)  ?Creatinine 0.61 - 1.24 mg/dL 1.78(H) 1.64(H) 1.70(H)  ?BUN/Creat Ratio 10 - 24 - - -  ?Sodium 135 - 145 mmol/L 140 140 138  ?Potassium 3.5 - 5.1 mmol/L 4.1 3.8 3.8  ?Chloride 98 - 111 mmol/L 109 107 103  ?CO2 22 - 32 mmol/L '23 25 25  '$ ?Calcium 8.9 - 10.3 mg/dL 8.5(L) 8.9 8.6(L)  ? Creatinine 1.78 on 3/6- in range- f/u with PCP ?10. Hyperactive bladder w/nocturia: Has incontinence has been an issue with urgency-->does  not like depends. Nocturia decreased with merabegron plus low dose alpha blocker  ? ?11.  RA: Has had shoulder pain on and off--uses tylenol prn. Also with left hip contracture which pt states is old xray shows mild OA - no active synovitis  ?12. Vitamin B 12 deficiency: B-12 341-->no on weekly supplement ?13. Pre-diabetes: Hgb A1C-5.8. Monitor FBS with weekly checks. ?14. Hypothyroid: Continue supplement.  ?15.  Slow transit constipation ? Improving ? 16.  Left hip contracture ROM per PT, Xray unremarkable  ? ?LOS: ?22 days ?A FACE TO FACE EVALUATION  WAS PERFORMED ? ?Luanna Salk Melda Mermelstein ?07/29/2021, 8:23 AM  ? ? ? ?

## 2021-08-01 ENCOUNTER — Telehealth: Payer: Self-pay

## 2021-08-01 DIAGNOSIS — M6281 Muscle weakness (generalized): Secondary | ICD-10-CM | POA: Diagnosis not present

## 2021-08-01 DIAGNOSIS — R2681 Unsteadiness on feet: Secondary | ICD-10-CM | POA: Diagnosis not present

## 2021-08-01 NOTE — Telephone Encounter (Signed)
Transition Care Management Unsuccessful Follow-up Telephone Call ? ?Date of discharge and from where:  07/29/2021-[facility] ? ?Attempts:  2nd Attempt ? ?Reason for unsuccessful TCM follow-up call:  No answer/busy ? ? ? ?

## 2021-08-02 DIAGNOSIS — R2681 Unsteadiness on feet: Secondary | ICD-10-CM | POA: Diagnosis not present

## 2021-08-02 DIAGNOSIS — R2689 Other abnormalities of gait and mobility: Secondary | ICD-10-CM | POA: Diagnosis not present

## 2021-08-02 DIAGNOSIS — M6281 Muscle weakness (generalized): Secondary | ICD-10-CM | POA: Diagnosis not present

## 2021-08-03 DIAGNOSIS — R2689 Other abnormalities of gait and mobility: Secondary | ICD-10-CM | POA: Diagnosis not present

## 2021-08-03 DIAGNOSIS — R2681 Unsteadiness on feet: Secondary | ICD-10-CM | POA: Diagnosis not present

## 2021-08-03 DIAGNOSIS — M6281 Muscle weakness (generalized): Secondary | ICD-10-CM | POA: Diagnosis not present

## 2021-08-03 DIAGNOSIS — I639 Cerebral infarction, unspecified: Secondary | ICD-10-CM | POA: Diagnosis not present

## 2021-08-04 DIAGNOSIS — R2681 Unsteadiness on feet: Secondary | ICD-10-CM | POA: Diagnosis not present

## 2021-08-04 DIAGNOSIS — R488 Other symbolic dysfunctions: Secondary | ICD-10-CM | POA: Diagnosis not present

## 2021-08-04 DIAGNOSIS — R2689 Other abnormalities of gait and mobility: Secondary | ICD-10-CM | POA: Diagnosis not present

## 2021-08-04 DIAGNOSIS — R1312 Dysphagia, oropharyngeal phase: Secondary | ICD-10-CM | POA: Diagnosis not present

## 2021-08-04 DIAGNOSIS — M6281 Muscle weakness (generalized): Secondary | ICD-10-CM | POA: Diagnosis not present

## 2021-08-05 DIAGNOSIS — R2681 Unsteadiness on feet: Secondary | ICD-10-CM | POA: Diagnosis not present

## 2021-08-05 DIAGNOSIS — M6281 Muscle weakness (generalized): Secondary | ICD-10-CM | POA: Diagnosis not present

## 2021-08-08 DIAGNOSIS — R2689 Other abnormalities of gait and mobility: Secondary | ICD-10-CM | POA: Diagnosis not present

## 2021-08-08 DIAGNOSIS — M6281 Muscle weakness (generalized): Secondary | ICD-10-CM | POA: Diagnosis not present

## 2021-08-09 DIAGNOSIS — R2689 Other abnormalities of gait and mobility: Secondary | ICD-10-CM | POA: Diagnosis not present

## 2021-08-09 DIAGNOSIS — R2681 Unsteadiness on feet: Secondary | ICD-10-CM | POA: Diagnosis not present

## 2021-08-09 DIAGNOSIS — M6281 Muscle weakness (generalized): Secondary | ICD-10-CM | POA: Diagnosis not present

## 2021-08-10 ENCOUNTER — Encounter: Payer: Medicare Other | Admitting: Registered Nurse

## 2021-08-10 ENCOUNTER — Encounter: Payer: Medicare Other | Attending: Registered Nurse | Admitting: Registered Nurse

## 2021-08-10 ENCOUNTER — Other Ambulatory Visit: Payer: Self-pay

## 2021-08-10 VITALS — BP 145/74 | HR 64 | Ht 59.0 in | Wt 125.0 lb

## 2021-08-10 DIAGNOSIS — M6281 Muscle weakness (generalized): Secondary | ICD-10-CM | POA: Diagnosis not present

## 2021-08-10 DIAGNOSIS — G8929 Other chronic pain: Secondary | ICD-10-CM | POA: Diagnosis not present

## 2021-08-10 DIAGNOSIS — I10 Essential (primary) hypertension: Secondary | ICD-10-CM | POA: Diagnosis not present

## 2021-08-10 DIAGNOSIS — R2689 Other abnormalities of gait and mobility: Secondary | ICD-10-CM | POA: Diagnosis not present

## 2021-08-10 DIAGNOSIS — I639 Cerebral infarction, unspecified: Secondary | ICD-10-CM | POA: Diagnosis not present

## 2021-08-10 DIAGNOSIS — M25511 Pain in right shoulder: Secondary | ICD-10-CM | POA: Insufficient documentation

## 2021-08-10 DIAGNOSIS — R2681 Unsteadiness on feet: Secondary | ICD-10-CM | POA: Diagnosis not present

## 2021-08-10 MED ORDER — CLOPIDOGREL BISULFATE 75 MG PO TABS: 75.0000 mg | ORAL_TABLET | Freq: Every day | ORAL | 0 refills | Status: DC

## 2021-08-10 NOTE — Progress Notes (Signed)
? ?Subjective:  ? ? Patient ID: [name], male    DOB: August 05, 1928, 88 y.o.   MRN: [id number] ? ?HPI: [name] is a 88 y.o. male who is here for [place] appointment for follow up of her Stroke ( Cerebrum), Essential Hypertension and Chronic Right Shoulder Pain.  He was admitted [facility] on 06/30/2021 after a fall.  ? ?Dr Tamala Julian H&P on 06/28/2021 ?HPI: [name] is a 88 y.o. male with medical history significant of hypertension, CVA, CKD 3, hypothyroidism, colon cancer s/p hemicolectomy with chemotherapy, RA, and memory issues who presents with presented after having a fall.  Patient is supposed to get around with use of a cane or walker, but daughter states that he likely does not use it when no one is around.  He lives with his wife currently in independent living at [facility].  History is obtained from the patient with the assistance of his daughter who is present at bedside.  Over the last 2 weeks it seemed to decline and was reported to have been falling 1-2 per day.  Daughter reports that his wife had stated that he just woke up and was weak 1 day.  Falls were initially thought to patient either tripping or his knee giving out.  He been seen in the ED on 2/2 and 2/3 due to falls and weakness.  Physical therapy had been arranged, but there was some miscommunication during the 2/3 ED visit as they thought that the patient had access to more intensive rehab opportunities and did not decide for admission.  His daughter picked him up after the visit and noticed that he was needing more assistance especially on the left side while getting him back into the car.  He was able to be evaluated by physical therapy who suspected that symptoms were more likely secondary to her stroke.  He followed up with his primary care provider yesterday through a televisit and paperwork was sent to request SNF placement.  However at around 10 PM  last night patient had another fall landing on his left side.  He complained of left  sided headache, arm pain, and hip pain. ? ?CT Cervical Spine:  ?IMPRESSION: ?Advanced diffuse degenerative disc and facet disease. ?  ?No acute bony abnormality. ?  ?MRI/MRA ?IMPRESSION: ?Brain MRI: ?  ?1. Small acute perforator infarct at the right corona radiata. ?2. Extensive chronic small vessel ischemia. Chronic small vessel ?infarcts in the cerebellum and subcortical brain. ?3. Brain atrophy. ?  ?Intracranial MRA: ?  ?1. Advanced and diffuse atherosclerosis. ?2. Advanced right M1 segment stenosis that is new from a 2020 CTA. ?3. 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 Chronic high-grade ?narrowing in the left V4 segment and basilar, non progressed from ?2020. ?  ?Neck MRA: ?  ?1. 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 ?2. 50% narrowing at the left vertebral origin, chronic when compared ?to 2020 CTA. ?3. No stenosis of the cervical common and internal carotids. ?  ?Neurology Consulted: He was maintained on DAPT x 3 months  followed by Plavix alone.  ? ?[name] was admitted to inpatient rehabilitation on 07/07/2021 and discharged on 07/29/2021 to [facility]. He states his pain is located in left arm. He rates his pain 5. He arrived in wheelchair. ? ?Daughter and Translator in room, all questions answered.  ?  ? ?Pain Inventory ?Average Pain 5 ?Pain Right Now 5 ?My pain is aching ? ?LOCATION OF PAIN  left arm ? ?BOWEL ?Number  of stools per week: 3-4 ?Oral laxative use Yes  ?Type of laxative stool softner ?Enema or suppository use No  ?History of colostomy No  ?Incontinent No  ? ?BLADDER ?Pads ?In and out cath, frequency na ?Able to self cath  na ?Bladder incontinence Yes  ?Frequent urination No  ?Leakage with coughing No  ?Difficulty starting stream No  ?Incomplete bladder emptying No  ? ? ?Mobility ?walk with assistance ?use a walker ?how many minutes can you walk? 7 ?ability to climb steps?  no ?do you drive?  no ?use a wheelchair ?needs help with transfers ? ?Function ?retired ?I need  assistance with the following:  feeding, dressing, bathing, toileting, meal prep, household duties, and shopping ? ?Neuro/Psych ?bladder control problems ?weakness ?trouble walking ?confusion ?depression ? ?Prior Studies ?TC appt ? ?Physicians involved in your care ?TC appt ? ? ?Family History  ?Problem Relation Age of Onset  ? Diabetes Neg Hx   ? Heart attack Neg Hx   ? Colon cancer Neg Hx   ? Prostate cancer Neg Hx   ? ?Social History  ? ?Socioeconomic History  ? Marital status: Married  ?  Spouse name: Not on file  ? Number of children: 2  ? Years of education: Not on file  ? Highest education level: Not on file  ?Occupational History  ?  Employer: RETIRED  ?Tobacco Use  ? Smoking status: Never  ? Smokeless tobacco: Never  ?Vaping Use  ? Vaping Use: Never used  ?Substance and Sexual Activity  ? Alcohol use: No  ?  Alcohol/week: 0.0 standard drinks  ? Drug use: No  ? Sexual activity: Not on file  ?Other Topics Concern  ? Not on file  ?Social History Narrative  ? Original from [place]  ? [place]  ? Daily caffeine use one per day  ? ?Social Determinants of Health  ? ?Financial Resource Strain: Low Risk   ? Difficulty of Paying Living Expenses: Not hard at all  ?Food Insecurity: Not on file  ?Transportation Needs: Not on file  ?Physical Activity: Inactive  ? Days of Exercise per Week: 0 days  ? Minutes of Exercise per Session: 0 min  ?Stress: Not on file  ?Social Connections: Not on file  ? ?Past Surgical History:  ?Procedure Laterality Date  ? COLON SURGERY    ? Right / Adenocarcinoma / Chemo  ? COLONOSCOPY  2005, 2008  ? Dr. Lucia Gaskins  ? INGUINAL HERNIA REPAIR    ? Right  ? IR ANGIO INTRA EXTRACRAN SEL COM CAROTID INNOMINATE BILAT MOD SED  12/13/2017  ? IR ANGIO VERTEBRAL SEL VERTEBRAL BILAT MOD SED  12/13/2017  ? IR PTA INTRACRANIAL  12/18/2017  ? RADIOLOGY WITH ANESTHESIA N/A 12/18/2017  ? Procedure: Angioplasty with stenting;  Surgeon: Luanne Bras, MD;  Location: [facility];  Service: Radiology;  Laterality: N/A;   ? ?Past Medical History:  ?Diagnosis Date  ? Adenocarcinoma ([place]) 1985  ? colon s/p right hemicolectomy and chemotherapy. F/U with cancer center and Dr. Lucia Gaskins rotinely  ? Depression   ? Eczema   ? Epistaxis   ? f/u per ENT  ? Erectile dysfunction   ? Gastric ulcer 12/11  ? H-Pylori Tx, EGD 3-12: gastritis  ? Hay fever   ? Head injury 11/2017  ? with fall  ? Hyperlipemia   ? Hypertension   ? Hypothyroidism   ? Hypothyroid  ? Insomnia   ? Osteopenia   ?  per DEXA 07-2008 (Rx fosamax)  ? Rheumatoid arthritis (  [place])   ? Stroke [facility])   ?  " balance issue"  ? ?BP (!) 145/74   Pulse 64   Ht '4\' 11"'$  (1.499 m)   Wt 125 lb (56.7 kg)   SpO2 97%   BMI 25.25 kg/m?  ? ?Opioid Risk Score:   ?Fall Risk Score:  `1 ? ?Depression screen PHQ 2/9 ? ? ?  08/10/2021  ?  9:50 AM 06/09/2021  ? 10:54 AM 04/13/2020  ?  1:37 PM  ?Depression screen PHQ 2/9  ?Decreased Interest 0 0 0  ?Down, Depressed, Hopeless 0 0 0  ?PHQ - 2 Score 0 0 0  ?Altered sleeping 1    ?Tired, decreased energy 0    ?Change in appetite 0    ?Feeling bad or failure about yourself  0    ?Trouble concentrating 0    ?Moving slowly or fidgety/restless 1    ?Suicidal thoughts 0    ?PHQ-9 Score 2    ?Difficult doing work/chores Not difficult at all    ?  ? ?Review of Systems  ?Respiratory:  Positive for cough.   ?Gastrointestinal:  Positive for constipation.  ?Musculoskeletal:  Positive for gait problem.  ?     Left arm pain  ?Neurological:  Positive for weakness.  ?Psychiatric/Behavioral:  Positive for confusion and dysphoric mood.   ?All other systems reviewed and are negative. ? ?   ?Objective:  ? Physical Exam ?Vitals and nursing note reviewed.  ?Constitutional:   ?   Appearance: Normal appearance.  ?Cardiovascular:  ?   Rate and Rhythm: Normal rate and regular rhythm.  ?   Pulses: Normal pulses.  ?   Heart sounds: Normal heart sounds.  ?Pulmonary:  ?   Effort: Pulmonary effort is normal.  ?   Breath sounds: Normal breath sounds.  ?Musculoskeletal:  ?   Cervical back:  Normal range of motion and neck supple.  ?   Comments: Normal Muscle Bulk and Muscle Testing Reveals:  ?Upper Extremities:  Right : Full ROM and Muscle Strength 5/5 ?Left Upper Extremity: Decreased ROM 45 Deg

## 2021-08-11 DIAGNOSIS — R2681 Unsteadiness on feet: Secondary | ICD-10-CM | POA: Diagnosis not present

## 2021-08-11 DIAGNOSIS — R2689 Other abnormalities of gait and mobility: Secondary | ICD-10-CM | POA: Diagnosis not present

## 2021-08-11 DIAGNOSIS — M6281 Muscle weakness (generalized): Secondary | ICD-10-CM | POA: Diagnosis not present

## 2021-08-12 DIAGNOSIS — M6281 Muscle weakness (generalized): Secondary | ICD-10-CM | POA: Diagnosis not present

## 2021-08-12 DIAGNOSIS — R2689 Other abnormalities of gait and mobility: Secondary | ICD-10-CM | POA: Diagnosis not present

## 2021-08-15 DIAGNOSIS — R488 Other symbolic dysfunctions: Secondary | ICD-10-CM | POA: Diagnosis not present

## 2021-08-15 DIAGNOSIS — M6281 Muscle weakness (generalized): Secondary | ICD-10-CM | POA: Diagnosis not present

## 2021-08-15 DIAGNOSIS — R2681 Unsteadiness on feet: Secondary | ICD-10-CM | POA: Diagnosis not present

## 2021-08-15 DIAGNOSIS — R1312 Dysphagia, oropharyngeal phase: Secondary | ICD-10-CM | POA: Diagnosis not present

## 2021-08-15 DIAGNOSIS — R2689 Other abnormalities of gait and mobility: Secondary | ICD-10-CM | POA: Diagnosis not present

## 2021-08-16 ENCOUNTER — Encounter: Payer: Self-pay | Admitting: Registered Nurse

## 2021-08-16 DIAGNOSIS — R488 Other symbolic dysfunctions: Secondary | ICD-10-CM | POA: Diagnosis not present

## 2021-08-16 DIAGNOSIS — R2689 Other abnormalities of gait and mobility: Secondary | ICD-10-CM | POA: Diagnosis not present

## 2021-08-16 DIAGNOSIS — R2681 Unsteadiness on feet: Secondary | ICD-10-CM | POA: Diagnosis not present

## 2021-08-16 DIAGNOSIS — M6281 Muscle weakness (generalized): Secondary | ICD-10-CM | POA: Diagnosis not present

## 2021-08-16 DIAGNOSIS — R1312 Dysphagia, oropharyngeal phase: Secondary | ICD-10-CM | POA: Diagnosis not present

## 2021-08-17 ENCOUNTER — Ambulatory Visit: Payer: Self-pay

## 2021-08-17 DIAGNOSIS — M6281 Muscle weakness (generalized): Secondary | ICD-10-CM | POA: Diagnosis not present

## 2021-08-17 DIAGNOSIS — E785 Hyperlipidemia, unspecified: Secondary | ICD-10-CM

## 2021-08-17 DIAGNOSIS — I10 Essential (primary) hypertension: Secondary | ICD-10-CM

## 2021-08-17 DIAGNOSIS — R2689 Other abnormalities of gait and mobility: Secondary | ICD-10-CM | POA: Diagnosis not present

## 2021-08-17 DIAGNOSIS — R2681 Unsteadiness on feet: Secondary | ICD-10-CM | POA: Diagnosis not present

## 2021-08-17 NOTE — Chronic Care Management (AMB) (Signed)
?  Care Management  ? ?Follow Up Note ? ? ?08/17/2021 ?Name: [name] MRN: [id number] DOB: 1928/08/30 ? ? ?Referred by: [name], DO ?Reason for referral : No chief complaint on file. ? ? ?RNCM received message from [place] of unsuccessful outreach x3 attempts to reschedule patient with no response. RNCM is available to re-engage with the patient after provider conversation with the patient regarding recommendation for care management engagement and subsequent re-referral for RNCM. ? ?Follow Up Plan: No further follow up required  ? ?Thea Silversmith, RN, MSN, BSN, CCM ?Care Management Coordinator ?[place] ?[phone]  ?

## 2021-08-17 NOTE — Patient Instructions (Signed)
Visit Information  Thank you for allowing me to share the care management and care coordination services that are available to you as part of your health plan and services through your primary care provider and medical home. Please reach out to me at [phone] if the care management/care coordination team may be of assistance to you in the future.   Juanitta Earnhardt, RN, MSN, BSN, CCM Care Management Coordinator [facility] [phone]  

## 2021-08-18 ENCOUNTER — Telehealth: Payer: Self-pay | Admitting: *Deleted

## 2021-08-18 ENCOUNTER — Other Ambulatory Visit: Payer: Self-pay | Admitting: Physical Medicine and Rehabilitation

## 2021-08-18 DIAGNOSIS — R2681 Unsteadiness on feet: Secondary | ICD-10-CM | POA: Diagnosis not present

## 2021-08-18 DIAGNOSIS — R2689 Other abnormalities of gait and mobility: Secondary | ICD-10-CM | POA: Diagnosis not present

## 2021-08-18 DIAGNOSIS — M6281 Muscle weakness (generalized): Secondary | ICD-10-CM | POA: Diagnosis not present

## 2021-08-18 NOTE — Chronic Care Management (AMB) (Signed)
?  Care Management  ? ?Note ? ?08/18/2021 ?Name: [name] MRN: [id number] DOB: 1928-08-01 ? ?[name] is a 88 y.o. year old male who is a primary care patient of [name], DO and is actively engaged with the care management team. I reached out to Baron Sane by phone today to assist with re-scheduling a follow up visit with the RN Case Manager ? ?Follow up plan: ?We have been unable to make contact with the patient for follow up. The care management team is available to follow up with the patient after provider conversation with the patient regarding recommendation for care management engagement and subsequent re-referral to the care management team.  ? ?Outreaches made to [name] on 07/15/2021, 07/27/2021, 08/09/2021, and 08/15/2021 ? ?Hrishikesh Hoeg, CCMA ?Care Guide, Embedded Care Coordination ?[place]  Care Management  ?Direct Dial: [phone] ? ? ?

## 2021-08-19 DIAGNOSIS — R2681 Unsteadiness on feet: Secondary | ICD-10-CM | POA: Diagnosis not present

## 2021-08-19 DIAGNOSIS — M6281 Muscle weakness (generalized): Secondary | ICD-10-CM | POA: Diagnosis not present

## 2021-08-19 DIAGNOSIS — R2689 Other abnormalities of gait and mobility: Secondary | ICD-10-CM | POA: Diagnosis not present

## 2021-08-19 DIAGNOSIS — R488 Other symbolic dysfunctions: Secondary | ICD-10-CM | POA: Diagnosis not present

## 2021-08-19 DIAGNOSIS — R1312 Dysphagia, oropharyngeal phase: Secondary | ICD-10-CM | POA: Diagnosis not present

## 2021-08-20 ENCOUNTER — Other Ambulatory Visit: Payer: Self-pay | Admitting: Physical Medicine and Rehabilitation

## 2021-08-22 DIAGNOSIS — R2689 Other abnormalities of gait and mobility: Secondary | ICD-10-CM | POA: Diagnosis not present

## 2021-08-22 DIAGNOSIS — R2681 Unsteadiness on feet: Secondary | ICD-10-CM | POA: Diagnosis not present

## 2021-08-22 DIAGNOSIS — M6281 Muscle weakness (generalized): Secondary | ICD-10-CM | POA: Diagnosis not present

## 2021-08-23 DIAGNOSIS — M6281 Muscle weakness (generalized): Secondary | ICD-10-CM | POA: Diagnosis not present

## 2021-08-23 DIAGNOSIS — R2681 Unsteadiness on feet: Secondary | ICD-10-CM | POA: Diagnosis not present

## 2021-08-23 DIAGNOSIS — R2689 Other abnormalities of gait and mobility: Secondary | ICD-10-CM | POA: Diagnosis not present

## 2021-08-24 DIAGNOSIS — R2681 Unsteadiness on feet: Secondary | ICD-10-CM | POA: Diagnosis not present

## 2021-08-24 DIAGNOSIS — M6281 Muscle weakness (generalized): Secondary | ICD-10-CM | POA: Diagnosis not present

## 2021-08-24 DIAGNOSIS — R1312 Dysphagia, oropharyngeal phase: Secondary | ICD-10-CM | POA: Diagnosis not present

## 2021-08-24 DIAGNOSIS — R2689 Other abnormalities of gait and mobility: Secondary | ICD-10-CM | POA: Diagnosis not present

## 2021-08-24 DIAGNOSIS — R488 Other symbolic dysfunctions: Secondary | ICD-10-CM | POA: Diagnosis not present

## 2021-08-25 DIAGNOSIS — R2681 Unsteadiness on feet: Secondary | ICD-10-CM | POA: Diagnosis not present

## 2021-08-25 DIAGNOSIS — M6281 Muscle weakness (generalized): Secondary | ICD-10-CM | POA: Diagnosis not present

## 2021-08-25 DIAGNOSIS — R2689 Other abnormalities of gait and mobility: Secondary | ICD-10-CM | POA: Diagnosis not present

## 2021-08-26 DIAGNOSIS — R2689 Other abnormalities of gait and mobility: Secondary | ICD-10-CM | POA: Diagnosis not present

## 2021-08-26 DIAGNOSIS — R2681 Unsteadiness on feet: Secondary | ICD-10-CM | POA: Diagnosis not present

## 2021-08-26 DIAGNOSIS — M6281 Muscle weakness (generalized): Secondary | ICD-10-CM | POA: Diagnosis not present

## 2021-08-26 DIAGNOSIS — R1312 Dysphagia, oropharyngeal phase: Secondary | ICD-10-CM | POA: Diagnosis not present

## 2021-08-26 DIAGNOSIS — R488 Other symbolic dysfunctions: Secondary | ICD-10-CM | POA: Diagnosis not present

## 2021-08-27 ENCOUNTER — Emergency Department (HOSPITAL_COMMUNITY)
Admission: EM | Admit: 2021-08-27 | Discharge: 2021-08-27 | Disposition: A | Discharge status: Skilled Nursing Facility | Payer: Medicare Other | Attending: Emergency Medicine | Admitting: Emergency Medicine

## 2021-08-27 ENCOUNTER — Encounter (HOSPITAL_COMMUNITY): Payer: Self-pay | Admitting: Emergency Medicine

## 2021-08-27 ENCOUNTER — Other Ambulatory Visit: Payer: Self-pay

## 2021-08-27 ENCOUNTER — Emergency Department (HOSPITAL_COMMUNITY): Payer: Medicare Other

## 2021-08-27 DIAGNOSIS — W010XXA Fall on same level from slipping, tripping and stumbling without subsequent striking against object, initial encounter: Secondary | ICD-10-CM | POA: Insufficient documentation

## 2021-08-27 DIAGNOSIS — M25552 Pain in left hip: Secondary | ICD-10-CM | POA: Diagnosis not present

## 2021-08-27 DIAGNOSIS — Y92002 Bathroom of unspecified non-institutional (private) residence single-family (private) house as the place of occurrence of the external cause: Secondary | ICD-10-CM | POA: Diagnosis not present

## 2021-08-27 DIAGNOSIS — M12812 Other specific arthropathies, not elsewhere classified, left shoulder: Secondary | ICD-10-CM

## 2021-08-27 DIAGNOSIS — R6889 Other general symptoms and signs: Secondary | ICD-10-CM | POA: Diagnosis not present

## 2021-08-27 DIAGNOSIS — I10 Essential (primary) hypertension: Secondary | ICD-10-CM | POA: Diagnosis not present

## 2021-08-27 DIAGNOSIS — Y9301 Activity, walking, marching and hiking: Secondary | ICD-10-CM | POA: Insufficient documentation

## 2021-08-27 DIAGNOSIS — M545 Low back pain, unspecified: Secondary | ICD-10-CM | POA: Diagnosis not present

## 2021-08-27 DIAGNOSIS — Z7902 Long term (current) use of antithrombotics/antiplatelets: Secondary | ICD-10-CM | POA: Diagnosis not present

## 2021-08-27 DIAGNOSIS — E039 Hypothyroidism, unspecified: Secondary | ICD-10-CM | POA: Diagnosis not present

## 2021-08-27 DIAGNOSIS — M25512 Pain in left shoulder: Secondary | ICD-10-CM | POA: Diagnosis present

## 2021-08-27 DIAGNOSIS — Z743 Need for continuous supervision: Secondary | ICD-10-CM | POA: Diagnosis not present

## 2021-08-27 DIAGNOSIS — Z7982 Long term (current) use of aspirin: Secondary | ICD-10-CM | POA: Diagnosis not present

## 2021-08-27 DIAGNOSIS — M549 Dorsalgia, unspecified: Secondary | ICD-10-CM | POA: Diagnosis not present

## 2021-08-27 DIAGNOSIS — Z79899 Other long term (current) drug therapy: Secondary | ICD-10-CM | POA: Insufficient documentation

## 2021-08-27 DIAGNOSIS — R531 Weakness: Secondary | ICD-10-CM | POA: Diagnosis not present

## 2021-08-27 DIAGNOSIS — M75102 Unspecified rotator cuff tear or rupture of left shoulder, not specified as traumatic: Secondary | ICD-10-CM | POA: Insufficient documentation

## 2021-08-27 DIAGNOSIS — R079 Chest pain, unspecified: Secondary | ICD-10-CM | POA: Diagnosis not present

## 2021-08-27 DIAGNOSIS — M19012 Primary osteoarthritis, left shoulder: Secondary | ICD-10-CM | POA: Diagnosis not present

## 2021-08-27 DIAGNOSIS — Z043 Encounter for examination and observation following other accident: Secondary | ICD-10-CM | POA: Diagnosis not present

## 2021-08-27 DIAGNOSIS — W19XXXA Unspecified fall, initial encounter: Secondary | ICD-10-CM | POA: Diagnosis not present

## 2021-08-27 DIAGNOSIS — M25519 Pain in unspecified shoulder: Secondary | ICD-10-CM | POA: Diagnosis not present

## 2021-08-27 DIAGNOSIS — R5383 Other fatigue: Secondary | ICD-10-CM | POA: Diagnosis not present

## 2021-08-27 LAB — CBC
HCT: 42.4 % (ref 39.0–52.0)
Hemoglobin: 13.6 g/dL (ref 13.0–17.0)
MCH: 29.8 pg (ref 26.0–34.0)
MCHC: 32.1 g/dL (ref 30.0–36.0)
MCV: 93 fL (ref 80.0–100.0)
Platelets: 206 10*3/uL (ref 150–400)
RBC: 4.56 MIL/uL (ref 4.22–5.81)
RDW: 13.1 % (ref 11.5–15.5)
WBC: 11.6 10*3/uL — ABNORMAL HIGH (ref 4.0–10.5)
nRBC: 0 % (ref 0.0–0.2)

## 2021-08-27 LAB — BASIC METABOLIC PANEL
Anion gap: 8 (ref 5–15)
BUN: 28 mg/dL — ABNORMAL HIGH (ref 8–23)
CO2: 23 mmol/L (ref 22–32)
Calcium: 9.4 mg/dL (ref 8.9–10.3)
Chloride: 110 mmol/L (ref 98–111)
Creatinine, Ser: 1.62 mg/dL — ABNORMAL HIGH (ref 0.61–1.24)
GFR, Estimated: 40 mL/min — ABNORMAL LOW (ref >60)
Glucose, Bld: 103 mg/dL — ABNORMAL HIGH (ref 70–99)
Potassium: 4 mmol/L (ref 3.5–5.1)
Sodium: 141 mmol/L (ref 135–145)

## 2021-08-27 MED ORDER — ACETAMINOPHEN 325 MG PO TABS
650.0000 mg | ORAL_TABLET | Freq: Once | ORAL | Status: AC
Start: 2021-08-27 — End: 2021-08-27
  Administered 2021-08-27: 650 mg via ORAL
  Filled 2021-08-27: qty 2

## 2021-08-27 NOTE — ED Notes (Signed)
[facility] called for transport.  

## 2021-08-27 NOTE — ED Notes (Signed)
Shoulder immobilizer applied. Daughter called for update on status. [facility] papers at nurse's station.  ?

## 2021-08-27 NOTE — Discharge Instructions (Signed)
Take Tylenol as needed for pain.  Use the sling as needed for discomfort.  The x-rays showed some mild arthritis in your shoulder as well as findings suggesting a rotator cuff tear.  Consider seeing an orthopedic doctor for further evaluation if your symptoms persist ?

## 2021-08-27 NOTE — ED Triage Notes (Signed)
BIB EMS from [facility], pt was attempting to wash his face and fell backwards, did not hit head and is not on blood thinners. Now has lower back pain and L shoulder pain.  ? ?BP 150/70 ?P 68 ?O2 97% RA ?CBG 126 ?

## 2021-08-27 NOTE — ED Provider Notes (Signed)
?[facility] DEPT ?Provider Note ? ? ?CSN: [id number] ?Arrival date & time: 08/27/21  0803 ? ?  ? ?History ? ? ? ?[name] is a 88 y.o. male. ? ? ?Patient has a history of hypertension and hypothyroidism, rheumatoid arthritis, insomnia, TIA, frequent falls ?, History of stroke who presents to the ED for evaluation after fall.  Patient states that he was walking to the bathroom when he slipped on the wet floor.  He ended up landing on his left shoulder.  Patient states he was able to avoid hitting his head.  He did not lose consciousness.  He was not feeling lightheaded or dizzy.  He is now having pain only in his left shoulder.  Family member contacted the ED and was concerned that the patient has been having some recurrent left hip pain.  I think that could have contributed to his fall.  No fevers.  No shortness of breath.  No numbness or weakness ?Home Medications ?Prior to Admission medications   ?Medication Sig Start Date End Date Taking? Authorizing Provider  ?acetaminophen (TYLENOL) 325 MG tablet Take 1-2 tablets (325-650 mg total) by mouth every 4 (four) hours as needed for mild pain. 07/08/21   Love, Ivan Anchors, PA-C  ?acetaminophen (TYLENOL) 500 MG tablet Take 1 tablet (500 mg total) by mouth at bedtime. 07/29/21   LoveIvan Anchors, PA-C  ?aspirin 325 MG EC tablet TAKE 1 TABLET BY MOUTH EVERY DAY 08/22/21   Bayard Hugger, NP  ?atorvastatin (LIPITOR) 40 MG tablet Take 1 tablet (40 mg total) by mouth daily. 07/29/21   Love, Ivan Anchors, PA-C  ?capsicum (ZOSTRIX) 0.075 % topical cream Apply 1 application. topically 4 (four) times daily -  with meals and at bedtime. Be sure to wear glove and do not touch eye, mouth or nose (any mucous membranes) Apply to left shoulder. 07/29/21   Bary Leriche, PA-C  ?clopidogrel (PLAVIX) 75 MG tablet Take 1 tablet (75 mg total) by mouth daily. Do Not Fill Before 08/26/2021 08/10/21   Bayard Hugger, NP  ?docusate sodium (COLACE) 100 MG capsule Take 2  capsules (200 mg total) by mouth daily. 07/29/21   Love, Ivan Anchors, PA-C  ?doxazosin (CARDURA) 1 MG tablet TAKE 1 TABLET BY MOUTH AT BEDTIME. 08/22/21   Bayard Hugger, NP  ?guaiFENesin (MUCINEX) 600 MG 12 hr tablet Take 600 mg by mouth 2 (two) times daily as needed for cough or to loosen phlegm.    [provider]  ?levothyroxine (SYNTHROID) 100 MCG tablet Take 1 tablet (100 mcg total) by mouth daily before breakfast. 03/01/21   Carollee Herter, Alferd Apa, DO  ?loratadine (CLARITIN) 10 MG tablet Take 1 tablet (10 mg total) by mouth daily as needed for allergies. 07/29/21   Love, Ivan Anchors, PA-C  ?melatonin 3 MG TABS tablet Take 1 tablet (3 mg total) by mouth at bedtime as needed. 07/29/21   Love, Ivan Anchors, PA-C  ?mirabegron ER (MYRBETRIQ) 25 MG TB24 tablet Take 25 mg by mouth at bedtime.    [provider]  ?Multiple Vitamins-Minerals (MULTIVITAMIN ADULT) CHEW Chew 2 each by mouth daily.    [provider]  ?NONFORMULARY OR COMPOUNDED ITEM 4 wheeled walker   #1  dx frequent falls, weakness 06/09/21   Carollee Herter, Alferd Apa, DO  ?pantoprazole (PROTONIX) 40 MG tablet Take 1 tablet (40 mg total) by mouth daily. 07/08/21   Ghimire, Henreitta Leber, MD  ?polyvinyl alcohol (LIQUIFILM TEARS) 1.4 % ophthalmic solution  Place 1 drop into both eyes daily as needed for dry eyes.    [provider]  ?   ? ?Allergies    ?Methotrexate derivatives, Asa [aspirin], and Penicillin g   ? ?Review of Systems   ?Review of Systems  ?Constitutional:  Negative for fever.  ?Musculoskeletal:  Positive for back pain.  ? ?Physical Exam ?Updated Vital Signs ?BP (!) 136/53   Pulse (!) 58   Temp 97.8 ?F (36.6 ?C) (Oral)   Resp 16   Ht 1.499 m ('4\' 11"'$ )   Wt 56.7 kg   SpO2 99%   BMI 25.25 kg/m?  ?Physical Exam ?Vitals and nursing note reviewed.  ?Constitutional:   ?   General: He is not in acute distress. ?   Appearance: He is well-developed.  ?   Comments: Elderly, frail  ?HENT:  ?   Head: Normocephalic and atraumatic.  ?    Right Ear: External ear normal.  ?   Left Ear: External ear normal.  ?Eyes:  ?   General: No scleral icterus.    ?   Right eye: No discharge.     ?   Left eye: No discharge.  ?   Conjunctiva/sclera: Conjunctivae normal.  ?Neck:  ?   Trachea: No tracheal deviation.  ?Cardiovascular:  ?   Rate and Rhythm: Normal rate and regular rhythm.  ?Pulmonary:  ?   Effort: Pulmonary effort is normal. No respiratory distress.  ?   Breath sounds: Normal breath sounds. No stridor. No wheezing or rales.  ?Abdominal:  ?   General: Bowel sounds are normal. There is no distension.  ?   Palpations: Abdomen is soft.  ?   Tenderness: There is no abdominal tenderness. There is no guarding or rebound.  ?Musculoskeletal:     ?   General: No deformity.  ?   Right shoulder: Normal.  ?   Left shoulder: Tenderness present. Normal range of motion.  ?   Right upper arm: Normal.  ?   Left upper arm: Normal.  ?   Right wrist: Normal.  ?   Left wrist: Normal.  ?   Cervical back: Normal and neck supple.  ?   Thoracic back: Normal.  ?   Lumbar back: Normal.  ?   Right hip: Normal.  ?   Left hip: Normal.  ?Skin: ?   General: Skin is warm and dry.  ?   Findings: No rash.  ?Neurological:  ?   General: No focal deficit present.  ?   Mental Status: He is alert.  ?   Cranial Nerves: No cranial nerve deficit (no facial droop, extraocular movements intact, no slurred speech).  ?   Sensory: No sensory deficit.  ?   Motor: No abnormal muscle tone or seizure activity.  ?   Coordination: Coordination normal.  ?Psychiatric:     ?   Mood and Affect: Mood normal.  ? ? ?ED Results / Procedures / Treatments   ?Labs ?(all labs ordered are listed, but only abnormal results are displayed) ?Labs Reviewed  ?CBC - Abnormal; Notable for the following components:  ?    Result Value  ? WBC 11.6 (*)   ? All other components within normal limits  ?BASIC METABOLIC PANEL - Abnormal; Notable for the following components:  ? Glucose, Bld 103 (*)   ? BUN 28 (*)   ? Creatinine, Ser  1.62 (*)   ? GFR, Estimated 40 (*)   ? All other components within normal limits  ? ? ?  EKG ?EKG Interpretation ? ?Date/Time:  Saturday August 27 2021 09:03:43 EDT ?Ventricular Rate:  62 ?PR Interval:  211 ?QRS Duration: 88 ?QT Interval:  422 ?QTC Calculation: 429 ?R Axis:   8 ?Text Interpretation: Sinus rhythm No significant change since last tracing Confirmed by Dorie Rank [phone]) on 08/27/2021 9:22:15 AM ? ?Radiology ?DG Chest 2 View ? ?Result Date: 08/27/2021 ?CLINICAL DATA:  Fall on LEFT side.  LEFT shoulder and LEFT hip pain. EXAM: CHEST - 2 VIEW COMPARISON:  Chest x-rays dated 06/29/2021 and 06/09/2021. FINDINGS: Heart size and mediastinal contours are stable. Lungs appear clear. No pleural effusion or pneumothorax is seen. No acute-appearing osseous abnormality. No displaced rib fracture identified. Osseous structures about the LEFT shoulder appear grossly intact and normally aligned. Degenerative spondylosis of the scoliotic thoracolumbar spine, mild to moderate in degree. IMPRESSION: No active cardiopulmonary disease. No evidence of pneumonia or pulmonary edema. No displaced rib fracture identified. Electronically Signed   By: Franki Cabot M.D.   On: 08/27/2021 09:22  ? ?DG Shoulder Left ? ?Result Date: 08/27/2021 ?CLINICAL DATA:  Fall EXAM: LEFT SHOULDER - 2+ VIEW COMPARISON:  06/29/2021. FINDINGS: There is no evidence of fracture or dislocation. Mild glenohumeral and acromioclavicular arthrosis. High riding position of the left humeral head suggests rotator cuff tear. Soft tissues are unremarkable. IMPRESSION: 1. No acute fracture or dislocation. 2. High riding position of the left humeral head suggests rotator cuff tear. 3. Mild glenohumeral and acromioclavicular arthrosis. Electronically Signed   By: Delanna Ahmadi M.D.   On: 08/27/2021 09:22  ? ?DG Hip Unilat W or Wo Pelvis 2-3 Views Left ? ?Result Date: 08/27/2021 ?CLINICAL DATA:  Fall, LEFT hip pain. EXAM: DG HIP (WITH OR WITHOUT PELVIS) 2-3V LEFT COMPARISON:   None. FINDINGS: Osseous alignment is normal. No evidence of LEFT hip fracture or dislocation. No significant degenerative change at either hip joint. At least mild degenerative change within the lower lumbar spine

## 2021-08-28 ENCOUNTER — Other Ambulatory Visit: Payer: Self-pay | Admitting: Physical Medicine and Rehabilitation

## 2021-08-29 ENCOUNTER — Telehealth: Payer: Self-pay

## 2021-08-29 DIAGNOSIS — R2681 Unsteadiness on feet: Secondary | ICD-10-CM | POA: Diagnosis not present

## 2021-08-29 DIAGNOSIS — R2689 Other abnormalities of gait and mobility: Secondary | ICD-10-CM | POA: Diagnosis not present

## 2021-08-29 DIAGNOSIS — M6281 Muscle weakness (generalized): Secondary | ICD-10-CM | POA: Diagnosis not present

## 2021-08-29 MED ORDER — ATORVASTATIN CALCIUM 40 MG PO TABS: 40.0000 mg | ORAL_TABLET | Freq: Every day | ORAL | 0 refills | Status: DC

## 2021-08-29 NOTE — Telephone Encounter (Signed)
FYI i'm sending in lipitor  for the pt ?

## 2021-08-30 DIAGNOSIS — M6281 Muscle weakness (generalized): Secondary | ICD-10-CM | POA: Diagnosis not present

## 2021-08-30 DIAGNOSIS — R2681 Unsteadiness on feet: Secondary | ICD-10-CM | POA: Diagnosis not present

## 2021-08-30 DIAGNOSIS — R2689 Other abnormalities of gait and mobility: Secondary | ICD-10-CM | POA: Diagnosis not present

## 2021-08-31 DIAGNOSIS — M6281 Muscle weakness (generalized): Secondary | ICD-10-CM | POA: Diagnosis not present

## 2021-08-31 DIAGNOSIS — R488 Other symbolic dysfunctions: Secondary | ICD-10-CM | POA: Diagnosis not present

## 2021-08-31 DIAGNOSIS — R2681 Unsteadiness on feet: Secondary | ICD-10-CM | POA: Diagnosis not present

## 2021-08-31 DIAGNOSIS — R2689 Other abnormalities of gait and mobility: Secondary | ICD-10-CM | POA: Diagnosis not present

## 2021-08-31 DIAGNOSIS — R1312 Dysphagia, oropharyngeal phase: Secondary | ICD-10-CM | POA: Diagnosis not present

## 2021-09-01 DIAGNOSIS — M6281 Muscle weakness (generalized): Secondary | ICD-10-CM | POA: Diagnosis not present

## 2021-09-01 DIAGNOSIS — R1312 Dysphagia, oropharyngeal phase: Secondary | ICD-10-CM | POA: Diagnosis not present

## 2021-09-01 DIAGNOSIS — R2681 Unsteadiness on feet: Secondary | ICD-10-CM | POA: Diagnosis not present

## 2021-09-01 DIAGNOSIS — R2689 Other abnormalities of gait and mobility: Secondary | ICD-10-CM | POA: Diagnosis not present

## 2021-09-01 DIAGNOSIS — R488 Other symbolic dysfunctions: Secondary | ICD-10-CM | POA: Diagnosis not present

## 2021-09-02 DIAGNOSIS — R2681 Unsteadiness on feet: Secondary | ICD-10-CM | POA: Diagnosis not present

## 2021-09-02 DIAGNOSIS — M6281 Muscle weakness (generalized): Secondary | ICD-10-CM | POA: Diagnosis not present

## 2021-09-02 DIAGNOSIS — R2689 Other abnormalities of gait and mobility: Secondary | ICD-10-CM | POA: Diagnosis not present

## 2021-09-05 ENCOUNTER — Other Ambulatory Visit: Payer: Self-pay | Admitting: Family Medicine

## 2021-09-05 ENCOUNTER — Encounter: Payer: Self-pay | Admitting: Family Medicine

## 2021-09-05 ENCOUNTER — Ambulatory Visit (INDEPENDENT_AMBULATORY_CARE_PROVIDER_SITE_OTHER): Payer: Medicare Other | Admitting: Family Medicine

## 2021-09-05 ENCOUNTER — Ambulatory Visit (HOSPITAL_BASED_OUTPATIENT_CLINIC_OR_DEPARTMENT_OTHER)
Admission: RE | Admit: 2021-09-05 | Discharge: 2021-09-05 | Disposition: A | Discharge status: Home / Self Care | Payer: Medicare Other | Source: Ambulatory Visit | Attending: Family Medicine | Admitting: Family Medicine

## 2021-09-05 VITALS — BP 140/74 | HR 68 | Temp 97.9°F | Resp 18 | Ht 59.0 in

## 2021-09-05 DIAGNOSIS — G5632 Lesion of radial nerve, left upper limb: Secondary | ICD-10-CM | POA: Diagnosis not present

## 2021-09-05 DIAGNOSIS — M6281 Muscle weakness (generalized): Secondary | ICD-10-CM | POA: Diagnosis not present

## 2021-09-05 DIAGNOSIS — E039 Hypothyroidism, unspecified: Secondary | ICD-10-CM

## 2021-09-05 DIAGNOSIS — S6292XA Unspecified fracture of left wrist and hand, initial encounter for closed fracture: Secondary | ICD-10-CM

## 2021-09-05 DIAGNOSIS — Z8673 Personal history of transient ischemic attack (TIA), and cerebral infarction without residual deficits: Secondary | ICD-10-CM

## 2021-09-05 DIAGNOSIS — R296 Repeated falls: Secondary | ICD-10-CM

## 2021-09-05 DIAGNOSIS — R2689 Other abnormalities of gait and mobility: Secondary | ICD-10-CM | POA: Diagnosis not present

## 2021-09-05 DIAGNOSIS — M25532 Pain in left wrist: Secondary | ICD-10-CM | POA: Diagnosis not present

## 2021-09-05 DIAGNOSIS — E785 Hyperlipidemia, unspecified: Secondary | ICD-10-CM

## 2021-09-05 DIAGNOSIS — I10 Essential (primary) hypertension: Secondary | ICD-10-CM

## 2021-09-05 DIAGNOSIS — R2681 Unsteadiness on feet: Secondary | ICD-10-CM | POA: Diagnosis not present

## 2021-09-05 DIAGNOSIS — M189 Osteoarthritis of first carpometacarpal joint, unspecified: Secondary | ICD-10-CM | POA: Diagnosis not present

## 2021-09-05 LAB — COMPREHENSIVE METABOLIC PANEL
ALT: 36 U/L (ref 0–53)
AST: 27 U/L (ref 0–37)
Albumin: 3.9 g/dL (ref 3.5–5.2)
Alkaline Phosphatase: 74 U/L (ref 39–117)
BUN: 28 mg/dL — ABNORMAL HIGH (ref 6–23)
CO2: 29 mEq/L (ref 19–32)
Calcium: 9.5 mg/dL (ref 8.4–10.5)
Chloride: 106 mEq/L (ref 96–112)
Creatinine, Ser: 1.45 mg/dL (ref 0.40–1.50)
GFR: 42.2 mL/min — ABNORMAL LOW (ref >60.00)
Glucose, Bld: 106 mg/dL — ABNORMAL HIGH (ref 70–99)
Potassium: 4.3 mEq/L (ref 3.5–5.1)
Sodium: 142 mEq/L (ref 135–145)
Total Bilirubin: 0.2 mg/dL (ref 0.2–1.2)
Total Protein: 7 g/dL (ref 6.0–8.3)

## 2021-09-05 LAB — LDL CHOLESTEROL, DIRECT: Direct LDL: 51 mg/dL

## 2021-09-05 LAB — LIPID PANEL
Cholesterol: 128 mg/dL (ref 0–200)
HDL: 36.5 mg/dL — ABNORMAL LOW (ref >39.00)
NonHDL: 91.81
Total CHOL/HDL Ratio: 4
Triglycerides: 375 mg/dL — ABNORMAL HIGH (ref 0.0–149.0)
VLDL: 75 mg/dL — ABNORMAL HIGH (ref 0.0–40.0)

## 2021-09-05 LAB — TSH: TSH: 1.3 u[IU]/mL (ref 0.35–5.50)

## 2021-09-05 MED ORDER — ATORVASTATIN CALCIUM 40 MG PO TABS: 40.0000 mg | ORAL_TABLET | Freq: Every day | ORAL | 1 refills | Status: DC

## 2021-09-05 NOTE — Assessment & Plan Note (Signed)
S/p fall ?Check xray  ? ?

## 2021-09-05 NOTE — Assessment & Plan Note (Signed)
Pt is getting pt at home  ?Daughter is requesting outpatient pt ?

## 2021-09-05 NOTE — Patient Instructions (Signed)
Stroke Prevention Some medical conditions and behaviors can lead to a higher chance of having a stroke. You can help prevent a stroke by eating healthy, exercising, not smoking, and managing any medical conditions you have. Stroke is a leading cause of functional impairment. Primary prevention is particularly important because a majority of strokes are first-time events. Stroke changes the lives of not only those who experience a stroke but also their family and other caregivers. How can this condition affect me? A stroke is a medical emergency and should be treated right away. A stroke can lead to brain damage and can sometimes be life-threatening. If a person gets medical treatment right away, there is a better chance of surviving and recovering from a stroke. What can increase my risk? The following medical conditions may increase your risk of a stroke: Cardiovascular disease. High blood pressure (hypertension). Diabetes. High cholesterol. Sickle cell disease. Blood clotting disorders (hypercoagulable state). Obesity. Sleep disorders (obstructive sleep apnea). Other risk factors include: Being older than age 60. Having a history of blood clots, stroke, or mini-stroke (transient ischemic attack, TIA). Genetic factors, such as race, ethnicity, or a family history of stroke. Smoking cigarettes or using other tobacco products. Taking birth control pills, especially if you also use tobacco. Heavy use of alcohol or drugs, especially cocaine and methamphetamine. Physical inactivity. What actions can I take to prevent this? Manage your health conditions High cholesterol levels. Eating a healthy diet is important for preventing high cholesterol. If cholesterol cannot be managed through diet alone, you may need to take medicines. Take any prescribed medicines to control your cholesterol as told by your health care provider. Hypertension. To reduce your risk of stroke, try to keep your blood  pressure below 130/80. Eating a healthy diet and exercising regularly are important for controlling blood pressure. If these steps are not enough to manage your blood pressure, you may need to take medicines. Take any prescribed medicines to control hypertension as told by your health care provider. Ask your health care provider if you should monitor your blood pressure at home. Have your blood pressure checked every year, even if your blood pressure is normal. Blood pressure increases with age and some medical conditions. Diabetes. Eating a healthy diet and exercising regularly are important parts of managing your blood sugar (glucose). If your blood sugar cannot be managed through diet and exercise, you may need to take medicines. Take any prescribed medicines to control your diabetes as told by your health care provider. Get evaluated for obstructive sleep apnea. Talk to your health care provider about getting a sleep evaluation if you snore a lot or have excessive sleepiness. Make sure that any other medical conditions you have, such as atrial fibrillation or atherosclerosis, are managed. Nutrition Follow instructions from your health care provider about what to eat or drink to help manage your health condition. These instructions may include: Reducing your daily calorie intake. Limiting how much salt (sodium) you use to 1,500 milligrams (mg) each day. Using only healthy fats for cooking, such as olive oil, canola oil, or sunflower oil. Eating healthy foods. You can do this by: Choosing foods that are high in fiber, such as whole grains, and fresh fruits and vegetables. Eating at least 5 servings of fruits and vegetables a day. Try to fill one-half of your plate with fruits and vegetables at each meal. Choosing lean protein foods, such as lean cuts of meat, poultry without skin, fish, tofu, beans, and nuts. Eating low-fat dairy products. Avoiding   foods that are high in sodium. This can help  lower blood pressure. Avoiding foods that have saturated fat, trans fat, and cholesterol. This can help prevent high cholesterol. Avoiding processed and prepared foods. Counting your daily carbohydrate intake.  Lifestyle If you drink alcohol: Limit how much you have to: 0-1 drink a day for women who are not pregnant. 0-2 drinks a day for men. Know how much alcohol is in your drink. In the [place] one drink equals one 12 oz bottle of beer (355mL), one 5 oz glass of wine (148mL), or one 1 oz glass of hard liquor (44mL). Do not use any products that contain nicotine or tobacco. These products include cigarettes, chewing tobacco, and vaping devices, such as e-cigarettes. If you need help quitting, ask your health care provider. Avoid secondhand smoke. Do not use drugs. Activity  Try to stay at a healthy weight. Get at least 30 minutes of exercise on most days, such as: Fast walking. Biking. Swimming. Medicines Take over-the-counter and prescription medicines only as told by your health care provider. Aspirin or blood thinners (antiplatelets or anticoagulants) may be recommended to reduce your risk of forming blood clots that can lead to stroke. Avoid taking birth control pills. Talk to your health care provider about the risks of taking birth control pills if: You are over 35 years old. You smoke. You get very bad headaches. You have had a blood clot. Where to find more information American Stroke Association: www.strokeassociation.org Get help right away if: You or a loved one has any symptoms of a stroke. "BE FAST" is an easy way to remember the main warning signs of a stroke: B - Balance. Signs are dizziness, sudden trouble walking, or loss of balance. E - Eyes. Signs are trouble seeing or a sudden change in vision. F - Face. Signs are sudden weakness or numbness of the face, or the face or eyelid drooping on one side. A - Arms. Signs are weakness or numbness in an arm. This happens  suddenly and usually on one side of the body. S - Speech. Signs are sudden trouble speaking, slurred speech, or trouble understanding what people say. T - Time. Time to call emergency services. Write down what time symptoms started. You or a loved one has other signs of a stroke, such as: A sudden, severe headache with no known cause. Nausea or vomiting. Seizure. These symptoms may represent a serious problem that is an emergency. Do not wait to see if the symptoms will go away. Get medical help right away. Call your local emergency services (911 in the [place] Do not drive yourself to the hospital. Summary You can help to prevent a stroke by eating healthy, exercising, not smoking, limiting alcohol intake, and managing any medical conditions you may have. Do not use any products that contain nicotine or tobacco. These include cigarettes, chewing tobacco, and vaping devices, such as e-cigarettes. If you need help quitting, ask your health care provider. Remember "BE FAST" for warning signs of a stroke. Get help right away if you or a loved one has any of these signs. This information is not intended to replace advice given to you by your health care provider. Make sure you discuss any questions you have with your health care provider. Document Revised: 12/08/2019 Document Reviewed: 12/08/2019 Elsevier Patient Education  2023 Elsevier Inc.  

## 2021-09-05 NOTE — Assessment & Plan Note (Signed)
Check labs 

## 2021-09-05 NOTE — Assessment & Plan Note (Signed)
Well controlled, no changes to meds. Encouraged heart healthy diet such as the DASH diet and exercise as tolerated.  °

## 2021-09-05 NOTE — Progress Notes (Addendum)
? ?Subjective:  ? ?By signing my name below, I, [name], attest that this documentation has been prepared under the direction and in the presence of [name] DO, 09/05/2021 ? ? Patient ID: [name], male    DOB: 04/15/1929, 88 y.o.   MRN: [id number] ? ?Chief Complaint  ?Patient presents with  ? Fall  ?  Pt seen in the ED on 08/27/21 for fall and injury to shoulder  ? Follow-up  ? ? ?HPI ?Patient is in today for a hospital follow-up. He is accompanied by a Optometrist and his daughter.  ? ?He is requesting a refill of 40 MG of Lipitor.  ? ?He was admitted to the ED on 06/28/2021 for a fall due to a stroke. He landed on his left side while getting out of bed. He was later discharged on 07/07/2021. He was then admitted to into rehab from 07/07/2021 - 07/29/2021. Since the stroke, his left side has been weak. 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 His daughter reports that he has not been using his split.  ? ?He was then admitted to the ED on 08/27/2021 for a rotator cuff arthropathy of the left shoulder that occurred when he fell in his bathroom. He was discharged on the same day. He is continuing to undergo physical, occupational and speech therapy. He states that his shoulder pain is improving. He is walking at home with a walker. His daughter states that his walking has not been improving since being discharged. She is requesting for him to be referred to a different specialist.  ? ? ?Past Medical History:  ?Diagnosis Date  ? Adenocarcinoma ([place]) 1985  ? colon s/p right hemicolectomy and chemotherapy. F/U with cancer center and Dr. Lucia Gaskins rotinely  ? Depression   ? Eczema   ? Epistaxis   ? f/u per ENT  ? Erectile dysfunction   ? Gastric ulcer 12/11  ? H-Pylori Tx, EGD 3-12: gastritis  ? Hay fever   ? Head injury 11/2017  ? with fall  ? Hyperlipemia   ? Hypertension   ? Hypothyroidism   ? Hypothyroid  ? Insomnia   ? Osteopenia   ?  per DEXA 07-2008 (Rx fosamax)  ? Rheumatoid  arthritis ([place])   ? Stroke [facility])   ?  " balance issue"  ? ? ?Past Surgical History:  ?Procedure Laterality Date  ? COLON SURGERY    ? Right / Adenocarcinoma / Chemo  ? COLONOSCOPY  2005, 2008  ? Dr. Lucia Gaskins  ? INGUINAL HERNIA REPAIR    ? Right  ? IR ANGIO INTRA EXTRACRAN SEL COM CAROTID INNOMINATE BILAT MOD SED  12/13/2017  ? IR ANGIO VERTEBRAL SEL VERTEBRAL BILAT MOD SED  12/13/2017  ? IR PTA INTRACRANIAL  12/18/2017  ? RADIOLOGY WITH ANESTHESIA N/A 12/18/2017  ? Procedure: Angioplasty with stenting;  Surgeon: Luanne Bras, MD;  Location: [facility];  Service: Radiology;  Laterality: N/A;  ? ? ?Family History  ?Problem Relation Age of Onset  ? Diabetes Neg Hx   ? Heart attack Neg Hx   ? Colon cancer Neg Hx   ? Prostate cancer Neg Hx   ? ? ?Social History  ? ?Socioeconomic History  ? Marital status: Married  ?  Spouse name: Not on file  ? Number of children: 2  ? Years of education: Not on file  ? Highest education level: Not on file  ?Occupational History  ?  Employer: RETIRED  ?Tobacco Use  ?  Smoking status: Never  ? Smokeless tobacco: Never  ?Vaping Use  ? Vaping Use: Never used  ?Substance and Sexual Activity  ? Alcohol use: No  ?  Alcohol/week: 0.0 standard drinks  ? Drug use: No  ? Sexual activity: Not on file  ?Other Topics Concern  ? Not on file  ?Social History Narrative  ? Original from [place]  ? [place]  ? Daily caffeine use one per day  ? ?Social Determinants of Health  ? ?Financial Resource Strain: Low Risk   ? Difficulty of Paying Living Expenses: Not hard at all  ?Food Insecurity: Not on file  ?Transportation Needs: Not on file  ?Physical Activity: Inactive  ? Days of Exercise per Week: 0 days  ? Minutes of Exercise per Session: 0 min  ?Stress: Not on file  ?Social Connections: Not on file  ?Intimate Partner Violence: Not on file  ? ? ?Outpatient Medications Prior to Visit  ?Medication Sig Dispense Refill  ? acetaminophen (TYLENOL) 325 MG tablet Take 1-2 tablets (325-650 mg total) by mouth every 4  (four) hours as needed for mild pain.    ? acetaminophen (TYLENOL) 500 MG tablet Take 1 tablet (500 mg total) by mouth at bedtime. 100 tablet 0  ? aspirin 325 MG EC tablet TAKE 1 TABLET BY MOUTH EVERY DAY 30 tablet 0  ? capsicum (ZOSTRIX) 0.075 % topical cream Apply 1 application. topically 4 (four) times daily -  with meals and at bedtime. Be sure to wear glove and do not touch eye, mouth or nose (any mucous membranes) Apply to left shoulder. 57 g 0  ? clopidogrel (PLAVIX) 75 MG tablet Take 1 tablet (75 mg total) by mouth daily. Do Not Fill Before 08/26/2021 30 tablet 0  ? docusate sodium (COLACE) 100 MG capsule Take 2 capsules (200 mg total) by mouth daily. 60 capsule 0  ? doxazosin (CARDURA) 1 MG tablet TAKE 1 TABLET BY MOUTH AT BEDTIME. 30 tablet 0  ? guaiFENesin (MUCINEX) 600 MG 12 hr tablet Take 600 mg by mouth 2 (two) times daily as needed for cough or to loosen phlegm.    ? levothyroxine (SYNTHROID) 100 MCG tablet Take 1 tablet (100 mcg total) by mouth daily before breakfast. 90 tablet 1  ? loratadine (CLARITIN) 10 MG tablet Take 1 tablet (10 mg total) by mouth daily as needed for allergies.    ? melatonin 3 MG TABS tablet Take 1 tablet (3 mg total) by mouth at bedtime as needed. 30 tablet 0  ? mirabegron ER (MYRBETRIQ) 25 MG TB24 tablet Take 25 mg by mouth at bedtime.    ? Multiple Vitamins-Minerals (MULTIVITAMIN ADULT) CHEW Chew 2 each by mouth daily.    ? NONFORMULARY OR COMPOUNDED ITEM 4 wheeled walker   #1  dx frequent falls, weakness 1 each 0  ? pantoprazole (PROTONIX) 40 MG tablet Take 1 tablet (40 mg total) by mouth daily.    ? polyvinyl alcohol (LIQUIFILM TEARS) 1.4 % ophthalmic solution Place 1 drop into both eyes daily as needed for dry eyes.    ? atorvastatin (LIPITOR) 40 MG tablet Take 1 tablet (40 mg total) by mouth daily. 30 tablet 0  ? ?No facility-administered medications prior to visit.  ? ? ?Allergies  ?Allergen Reactions  ? Methotrexate Derivatives Other (See Comments)  ?  Mouth sores /  pancytopenia  ? Asa [Aspirin] Other (See Comments)  ?  Gastric symptoms - can tolerate 81 mg aspirin  ? Penicillin G Rash  ?  Did  it involve swelling of the face/tongue/throat, SOB, or low BP? Unknown ?Did it involve sudden or severe rash/hives, skin peeling, or any reaction on the inside of your mouth or nose? Unknown ?Did you need to seek medical attention at a hospital or doctor's office? Unknown ?When did it last happen?   unknown    ?If all above answers are ?NO?, may proceed with cephalosporin use.  ? ? ?ROS ? ?   ?Objective:  ?  ?Physical Exam ?Constitutional:   ?   General: He is not in acute distress. ?   Appearance: Normal appearance. He is not ill-appearing.  ?HENT:  ?   Head: Normocephalic and atraumatic.  ?   Right Ear: External ear normal.  ?   Left Ear: External ear normal.  ?Eyes:  ?   Extraocular Movements: Extraocular movements intact.  ?   Pupils: Pupils are equal, round, and reactive to light.  ?Cardiovascular:  ?   Rate and Rhythm: Normal rate and regular rhythm.  ?   Heart sounds: Normal heart sounds. No murmur heard. ?  No gallop.  ?Pulmonary:  ?   Effort: Pulmonary effort is normal. No respiratory distress.  ?   Breath sounds: Normal breath sounds. No wheezing or rales.  ?Skin: ?   General: Skin is warm and dry.  ?Neurological:  ?   Mental Status: He is alert and oriented to person, place, and time.  ?Psychiatric:     ?   Judgment: Judgment normal.  ? ? ?BP 140/74 (BP Location: Right Arm, Patient Position: Sitting, Cuff Size: Normal)   Pulse 68   Temp 97.9 ?F (36.6 ?C) (Oral)   Resp 18   Ht '4\' 11"'$  (1.499 m)   SpO2 97%   BMI 25.25 kg/m?  ?Wt Readings from Last 3 Encounters:  ?08/27/21 125 lb (56.7 kg)  ?08/10/21 125 lb (56.7 kg)  ?07/29/21 120 lb 13 oz (54.8 kg)  ? ? ?Diabetic Foot Exam - Simple   ?No data filed ?  ? ?Lab Results  ?Component Value Date  ? WBC 11.6 (H) 08/27/2021  ? HGB 13.6 08/27/2021  ? HCT 42.4 08/27/2021  ? PLT 206 08/27/2021  ? GLUCOSE 103 (H) 08/27/2021  ? CHOL 173  06/29/2021  ? TRIG 170 (H) 06/29/2021  ? HDL 37 (L) 06/29/2021  ? LDLDIRECT 74.9 04/11/2011  ? LDLCALC 102 (H) 06/29/2021  ? ALT 15 07/08/2021  ? AST 19 07/08/2021  ? NA 141 08/27/2021  ? K 4.0 08/27/2021  ? CL 11

## 2021-09-05 NOTE — Assessment & Plan Note (Signed)
Encourage heart healthy diet such as MIND or DASH diet, increase exercise, avoid trans fats, simple carbohydrates and processed foods, consider a krill or fish or flaxseed oil cap daily.  °

## 2021-09-05 NOTE — Assessment & Plan Note (Signed)
Pt felt patient was presenting like radial nerve palsy--- unable to lift L wrist ?Check xray today ?

## 2021-09-06 DIAGNOSIS — R488 Other symbolic dysfunctions: Secondary | ICD-10-CM | POA: Diagnosis not present

## 2021-09-06 DIAGNOSIS — R1312 Dysphagia, oropharyngeal phase: Secondary | ICD-10-CM | POA: Diagnosis not present

## 2021-09-06 DIAGNOSIS — R2689 Other abnormalities of gait and mobility: Secondary | ICD-10-CM | POA: Diagnosis not present

## 2021-09-06 DIAGNOSIS — R2681 Unsteadiness on feet: Secondary | ICD-10-CM | POA: Diagnosis not present

## 2021-09-06 DIAGNOSIS — M6281 Muscle weakness (generalized): Secondary | ICD-10-CM | POA: Diagnosis not present

## 2021-09-07 DIAGNOSIS — R2681 Unsteadiness on feet: Secondary | ICD-10-CM | POA: Diagnosis not present

## 2021-09-07 DIAGNOSIS — R2689 Other abnormalities of gait and mobility: Secondary | ICD-10-CM | POA: Diagnosis not present

## 2021-09-07 DIAGNOSIS — M6281 Muscle weakness (generalized): Secondary | ICD-10-CM | POA: Diagnosis not present

## 2021-09-08 DIAGNOSIS — M6281 Muscle weakness (generalized): Secondary | ICD-10-CM | POA: Diagnosis not present

## 2021-09-08 DIAGNOSIS — R1312 Dysphagia, oropharyngeal phase: Secondary | ICD-10-CM | POA: Diagnosis not present

## 2021-09-08 DIAGNOSIS — R2689 Other abnormalities of gait and mobility: Secondary | ICD-10-CM | POA: Diagnosis not present

## 2021-09-08 DIAGNOSIS — R488 Other symbolic dysfunctions: Secondary | ICD-10-CM | POA: Diagnosis not present

## 2021-09-08 DIAGNOSIS — R2681 Unsteadiness on feet: Secondary | ICD-10-CM | POA: Diagnosis not present

## 2021-09-09 ENCOUNTER — Ambulatory Visit: Payer: Medicare Other | Admitting: Orthopedic Surgery

## 2021-09-09 ENCOUNTER — Encounter: Payer: Self-pay | Admitting: Orthopedic Surgery

## 2021-09-09 VITALS — Ht 59.0 in | Wt 125.0 lb

## 2021-09-09 DIAGNOSIS — M25512 Pain in left shoulder: Secondary | ICD-10-CM

## 2021-09-09 DIAGNOSIS — S62173A Displaced fracture of trapezium [larger multangular], unspecified wrist, initial encounter for closed fracture: Secondary | ICD-10-CM | POA: Insufficient documentation

## 2021-09-09 DIAGNOSIS — S62172A Displaced fracture of trapezium [larger multangular], left wrist, initial encounter for closed fracture: Secondary | ICD-10-CM | POA: Diagnosis not present

## 2021-09-09 DIAGNOSIS — R2689 Other abnormalities of gait and mobility: Secondary | ICD-10-CM | POA: Diagnosis not present

## 2021-09-09 DIAGNOSIS — M6281 Muscle weakness (generalized): Secondary | ICD-10-CM | POA: Diagnosis not present

## 2021-09-09 DIAGNOSIS — M189 Osteoarthritis of first carpometacarpal joint, unspecified: Secondary | ICD-10-CM | POA: Insufficient documentation

## 2021-09-09 DIAGNOSIS — R2681 Unsteadiness on feet: Secondary | ICD-10-CM | POA: Diagnosis not present

## 2021-09-09 DIAGNOSIS — M25532 Pain in left wrist: Secondary | ICD-10-CM | POA: Diagnosis not present

## 2021-09-09 DIAGNOSIS — M1812 Unilateral primary osteoarthritis of first carpometacarpal joint, left hand: Secondary | ICD-10-CM

## 2021-09-09 NOTE — Progress Notes (Signed)
? ?Office Visit Note ?  ?Patient: [name]           ?Date of Birth: [birth year]           ?MRN: [id number] ?Visit Date: 09/09/2021 ?             ?Requested by: Carollee Herter, Kendrick Fries R, DO ?[address] ?[address] ?[place],  [place] [address] ?PCP: [name], DO ? ? ?Assessment & Plan: ?Visit Diagnoses:  ?1. Closed displaced fracture of trapezium of left wrist, initial encounter   ?2. Primary osteoarthritis of first carpometacarpal joint of left hand   ? ? ?Plan: Discussed with patient and his daughter that he does have a small fracture fragment that looks like it is coming from the trapezium.  He also has evidence of for CMC osteoarthritis.  We discussed treatment options for this injury and underlying arthritic changes including oral anti-inflammatory medicines and immobilization.  Does not know to take anti-inflammatory medication.  We will put him in a thumb spica brace for some immobilization.  We will also refer him to my partner, Dr. Crosby Oyster, for further care of his left shoulder. ? ?Follow-Up Instructions: No follow-ups on file.  ? ?Orders:  ?No orders of the defined types were placed in this encounter. ? ?No orders of the defined types were placed in this encounter. ? ? ? ? Procedures: ?No procedures performed ? ? ?Clinical Data: ?No additional findings. ? ? ?Subjective: ?Chief Complaint  ?Patient presents with  ? Left Hand - Fracture  ? ? ?This is a 88 year old right-hand-dominant male who presents for left basilar thumb pain.  Patient is a complicated medical history including multiple CVAs with the first 1 occurring in November and the largest one seeming to occur in January.  He since developed some left upper extremity weakness since the stroke in January.  He has some limited wrist extension secondary to weakness.  His biggest complaint today is pain at the base of the thumb.  The pain is 6/10 at worst. He had a ground-level fall approximately 2 weeks ago in the bathtub at his  facility.  He since describes pain at the base of the thumb that is worse with thumb range of motion.  He denies pain in the wrist.  He denies pain at the ulnar side of the hand.  Denies pain in his fingers.  He has not had any treatment for this basilar thumb pain so far. ? ? ?Review of Systems ? ? ?Objective: ?Vital Signs: Ht '4\' 11"'  (1.499 m)   Wt 125 lb (56.7 kg)   BMI 25.25 kg/m?  ? ?Physical Exam ?Constitutional:   ?   Appearance: Normal appearance.  ?Cardiovascular:  ?   Rate and Rhythm: Normal rate.  ?   Pulses: Normal pulses.  ?Pulmonary:  ?   Effort: Pulmonary effort is normal.  ?Skin: ?   General: Skin is warm and dry.  ?   Capillary Refill: Capillary refill takes less than 2 seconds.  ?Neurological:  ?   Mental Status: He is alert.  ? ? ?Left Hand Exam  ? ?Tenderness  ?Left hand tenderness location: TTP at base of thumb around [facility] joint w/ mild swelling.  ? ?Other  ?Erythema: absent ?Sensation: normal ?Pulse: present ? ?Comments:  Pain and crepitus with CMC grind test.  TTP at volar aspect of trapezium.  Weak wrist extension but able to active extend to neutral from a flexed position.   ? ? ? ? ?  Specialty Comments:  ?No specialty comments available. ? ?Imaging: ?No results found. ? ? ?PMFS History: ?Patient Active Problem List  ? Diagnosis Date Noted  ? Trapezium fracture 09/09/2021  ? CMC arthritis, thumb, degenerative 09/09/2021  ? Left wrist pain 09/05/2021  ? Radial nerve palsy, left 09/05/2021  ? Acute shoulder pain due to trauma, left 07/29/2021  ? Slow transit constipation   ? Stroke (cerebrum) ([place]) 07/07/2021  ? CVA (cerebral vascular accident) [facility]) with intracranial vascular stenosis 06/29/2021  ? Intracranial vascular stenosis 06/29/2021  ? Prediabetes 06/29/2021  ? Normocytic anemia 06/29/2021  ? History of stroke 06/29/2021  ? Other drug-induced neutropenia ([place]) 06/09/2021  ? Malignant neoplasm of ascending colon ([place]) 06/09/2021  ? Stenosis of artery ([place]) 06/09/2021  ? Preventative  health care 03/01/2021  ? 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  ? Kidney disease, chronic, stage III (moderate, EGFR 30-59 ml/min) (HCC) 08/07/2020  ? Leukopenia 08/07/2020  ? Acute respiratory failure with hypoxia ([place]) 08/07/2020  ? SIRS (systemic inflammatory response syndrome) ([place]) 08/07/2020  ? Methotrexate adverse reaction, initial encounter   ? Acute pain of right shoulder 04/14/2020  ? Lower extremity edema 11/13/2019  ? Head trauma 11/13/2019  ? Dyslipidemia 11/13/2019  ? Memory loss 07/28/2019  ? Chronic right shoulder pain 07/28/2019  ? Frequent falls 07/28/2019  ? Balance problem 07/28/2019  ? Dysphagia 07/28/2019  ? Traumatic hemorrhage of cerebrum, unspecified, without loss of consciousness, initial encounter ([place])   ? Basilar artery stenosis with infarction ([place]) 12/18/2017  ? TIA (transient ischemic attack) 12/10/2017  ? Rheumatoid arthritis ([place])   ? Chest pain 06/02/2011  ? General medical examination 04/11/2011  ? PEPTIC ULCER DISEASE, HELICOBACTER PYLORI POSITIVE 07/12/2010  ? PERSONAL HISTORY MALIG NEOPLASM LARGE INTESTINE 05/18/2010  ? MITRAL VALVE DISORDERS 04/05/2009  ? HOARSENESS 12/04/2008  ? OSTEOPENIA 08/26/2008  ? HYPERTRIGLYCERIDEMIA 07/23/2008  ? EPISTAXIS 07/26/2007  ? ERECTILE DYSFUNCTION 02/22/2007  ? ECZEMA 12/07/2006  ? LEG CRAMPS 10/09/2006  ? Hypothyroidism 08/28/2006  ? Essential hypertension 08/28/2006  ? HAY FEVER 08/28/2006  ? INSOMNIA 08/28/2006  ? ?Past Medical History:  ?Diagnosis Date  ? Adenocarcinoma ([place]) 1985  ? colon s/p right hemicolectomy and chemotherapy. F/U with cancer center and Dr. Lucia Gaskins rotinely  ? Depression   ? Eczema   ? Epistaxis   ? f/u per ENT  ? Erectile dysfunction   ? Gastric ulcer 12/11  ? H-Pylori Tx, EGD 3-12: gastritis  ? Hay fever   ? Head injury 11/2017  ? with fall  ? Hyperlipemia   ? Hypertension   ? Hypothyroidism   ? Hypothyroid  ? Insomnia   ? Osteopenia   ?  per DEXA 07-2008 (Rx fosamax)  ? Rheumatoid arthritis ([place])   ? Stroke [facility])   ?  "  balance issue"  ?  ?Family History  ?Problem Relation Age of Onset  ? Diabetes Neg Hx   ? Heart attack Neg Hx   ? Colon cancer Neg Hx   ? Prostate cancer Neg Hx   ?  ?Past Surgical History:  ?Procedure Laterality Date  ? COLON SURGERY    ? Right / Adenocarcinoma / Chemo  ? COLONOSCOPY  2005, 2008  ? Dr. Lucia Gaskins  ? INGUINAL HERNIA REPAIR    ? Right  ? IR ANGIO INTRA EXTRACRAN SEL COM CAROTID INNOMINATE BILAT MOD SED  12/13/2017  ? IR ANGIO VERTEBRAL SEL VERTEBRAL BILAT MOD SED  12/13/2017  ? IR PTA INTRACRANIAL  12/18/2017  ? RADIOLOGY WITH ANESTHESIA N/A 12/18/2017  ?  Procedure: Angioplasty with stenting;  Surgeon: Luanne Bras, MD;  Location: [facility];  Service: Radiology;  Laterality: N/A;  ? ?Social History  ? ?Occupational History  ?  Employer: RETIRED  ?Tobacco Use  ? Smoking status: Never  ? Smokeless tobacco: Never  ?Vaping Use  ? Vaping Use: Never used  ?Substance and Sexual Activity  ? Alcohol use: No  ?  Alcohol/week: 0.0 standard drinks  ? Drug use: No  ? Sexual activity: Not on file  ? ? ? ? ? ? ?

## 2021-09-12 DIAGNOSIS — R1312 Dysphagia, oropharyngeal phase: Secondary | ICD-10-CM | POA: Diagnosis not present

## 2021-09-12 DIAGNOSIS — M6281 Muscle weakness (generalized): Secondary | ICD-10-CM | POA: Diagnosis not present

## 2021-09-12 DIAGNOSIS — R488 Other symbolic dysfunctions: Secondary | ICD-10-CM | POA: Diagnosis not present

## 2021-09-12 DIAGNOSIS — R2689 Other abnormalities of gait and mobility: Secondary | ICD-10-CM | POA: Diagnosis not present

## 2021-09-12 DIAGNOSIS — R2681 Unsteadiness on feet: Secondary | ICD-10-CM | POA: Diagnosis not present

## 2021-09-13 DIAGNOSIS — M6281 Muscle weakness (generalized): Secondary | ICD-10-CM | POA: Diagnosis not present

## 2021-09-13 DIAGNOSIS — R2689 Other abnormalities of gait and mobility: Secondary | ICD-10-CM | POA: Diagnosis not present

## 2021-09-13 DIAGNOSIS — R2681 Unsteadiness on feet: Secondary | ICD-10-CM | POA: Diagnosis not present

## 2021-09-14 DIAGNOSIS — R2681 Unsteadiness on feet: Secondary | ICD-10-CM | POA: Diagnosis not present

## 2021-09-14 DIAGNOSIS — M6281 Muscle weakness (generalized): Secondary | ICD-10-CM | POA: Diagnosis not present

## 2021-09-14 DIAGNOSIS — R2689 Other abnormalities of gait and mobility: Secondary | ICD-10-CM | POA: Diagnosis not present

## 2021-09-15 ENCOUNTER — Ambulatory Visit (HOSPITAL_BASED_OUTPATIENT_CLINIC_OR_DEPARTMENT_OTHER): Payer: Medicare Other | Admitting: Orthopaedic Surgery

## 2021-09-15 DIAGNOSIS — R2681 Unsteadiness on feet: Secondary | ICD-10-CM | POA: Diagnosis not present

## 2021-09-15 DIAGNOSIS — R2689 Other abnormalities of gait and mobility: Secondary | ICD-10-CM | POA: Diagnosis not present

## 2021-09-15 DIAGNOSIS — M25512 Pain in left shoulder: Secondary | ICD-10-CM | POA: Diagnosis not present

## 2021-09-15 DIAGNOSIS — M12812 Other specific arthropathies, not elsewhere classified, left shoulder: Secondary | ICD-10-CM

## 2021-09-15 DIAGNOSIS — M6281 Muscle weakness (generalized): Secondary | ICD-10-CM | POA: Diagnosis not present

## 2021-09-15 MED ORDER — TRIAMCINOLONE ACETONIDE 40 MG/ML IJ SUSP
80.0000 mg | INTRAMUSCULAR | Status: AC | PRN
  Administered 2021-09-15: 80 mg via INTRA_ARTICULAR

## 2021-09-15 MED ORDER — LIDOCAINE HCL 1 % IJ SOLN
4.0000 mL | INTRAMUSCULAR | Status: AC | PRN
  Administered 2021-09-15: 4 mL

## 2021-09-15 NOTE — Progress Notes (Signed)
? ?                            ? ? ?Chief Complaint: Left shoulder pain ?  ? ? ?History of Present Illness:  ? ? ?[name] is a 88 y.o. male right-hand-dominant male presents with left elbow pain which has been ongoing for approximately 15 years.  This has been worse over the last several months after he had a fall directly on the side.  He has had multiple issues with strokes recently for which she has had some left-sided weakness.  He is having limited range of motion and pain involving the left shoulder.  He is having a very difficult time sleeping on the side.  He is here today with his daughters.  They are translating for him in terms of the knees.  He is referral from my partner Dr. Tempie Donning ? ? ? ?Surgical History:   ?None ? ?PMH/PSH/Family History/Social History/Meds/Allergies:   ? ?Past Medical History:  ?Diagnosis Date  ?? Adenocarcinoma ([place]) 1985  ? colon s/p right hemicolectomy and chemotherapy. F/U with cancer center and Dr. Lucia Gaskins rotinely  ?? Depression   ?? Eczema   ?? Epistaxis   ? f/u per ENT  ?? Erectile dysfunction   ?? Gastric ulcer 12/11  ? H-Pylori Tx, EGD 3-12: gastritis  ?? Hay fever   ?? Head injury 11/2017  ? with fall  ?? Hyperlipemia   ?? Hypertension   ?? Hypothyroidism   ? Hypothyroid  ?? Insomnia   ?? Osteopenia   ?  per DEXA 07-2008 (Rx fosamax)  ?? Rheumatoid arthritis ([place])   ?? Stroke [facility])   ?  " balance issue"  ? ?Past Surgical History:  ?Procedure Laterality Date  ?? COLON SURGERY    ? Right / Adenocarcinoma / Chemo  ?? COLONOSCOPY  2005, 2008  ? Dr. Lucia Gaskins  ?? INGUINAL HERNIA REPAIR    ? Right  ?? IR ANGIO INTRA EXTRACRAN SEL COM CAROTID INNOMINATE BILAT MOD SED  12/13/2017  ?? IR ANGIO VERTEBRAL SEL VERTEBRAL BILAT MOD SED  12/13/2017  ?? IR PTA INTRACRANIAL  12/18/2017  ?? RADIOLOGY WITH ANESTHESIA N/A 12/18/2017  ? Procedure: Angioplasty with stenting;  Surgeon: Luanne Bras, MD;  Location: [facility];  Service: Radiology;  Laterality: N/A;  ? ?Social History   ? ?Socioeconomic History  ?? Marital status: Married  ?  Spouse name: Not on file  ?? Number of children: 2  ?? Years of education: Not on file  ?? Highest education level: Not on file  ?Occupational History  ?  Employer: RETIRED  ?Tobacco Use  ?? Smoking status: Never  ?? Smokeless tobacco: Never  ?Vaping Use  ?? Vaping Use: Never used  ?Substance and Sexual Activity  ?? Alcohol use: No  ?  Alcohol/week: 0.0 standard drinks  ?? Drug use: No  ?? Sexual activity: Not on file  ?Other Topics Concern  ?? Not on file  ?Social History Narrative  ? Original from [place]  ? [place]  ? Daily caffeine use one per day  ? ?Social Determinants of Health  ? ?Financial Resource Strain: Low Risk   ?? Difficulty of Paying Living Expenses: Not hard at all  ?Food Insecurity: Not on file  ?Transportation Needs: Not on file  ?Physical Activity: Inactive  ?? Days of Exercise per Week: 0 days  ?? Minutes of Exercise per Session: 0 min  ?Stress: Not on file  ?Social Connections:  Not on file  ? ?Family History  ?Problem Relation Age of Onset  ?? Diabetes Neg Hx   ?? Heart attack Neg Hx   ?? Colon cancer Neg Hx   ?? Prostate cancer Neg Hx   ? ?Allergies  ?Allergen Reactions  ?? Methotrexate Derivatives Other (See Comments)  ?  Mouth sores / pancytopenia  ?? Asa [Aspirin] Other (See Comments)  ?  Gastric symptoms - can tolerate 81 mg aspirin  ?? Penicillin G Rash  ?  Did it involve swelling of the face/tongue/throat, SOB, or low BP? Unknown ?Did it involve sudden or severe rash/hives, skin peeling, or any reaction on the inside of your mouth or nose? Unknown ?Did you need to seek medical attention at a hospital or doctor's office? Unknown ?When did it last happen?   unknown    ?If all above answers are ?NO?, may proceed with cephalosporin use.  ? ?Current Outpatient Medications  ?Medication Sig Dispense Refill  ?? acetaminophen (TYLENOL) 325 MG tablet Take 1-2 tablets (325-650 mg total) by mouth every 4 (four) hours as needed for mild  pain.    ?? acetaminophen (TYLENOL) 500 MG tablet Take 1 tablet (500 mg total) by mouth at bedtime. 100 tablet 0  ?? aspirin 325 MG EC tablet TAKE 1 TABLET BY MOUTH EVERY DAY 30 tablet 0  ?? atorvastatin (LIPITOR) 40 MG tablet Take 1 tablet (40 mg total) by mouth daily. 90 tablet 1  ?? capsicum (ZOSTRIX) 0.075 % topical cream Apply 1 application. topically 4 (four) times daily -  with meals and at bedtime. Be sure to wear glove and do not touch eye, mouth or nose (any mucous membranes) Apply to left shoulder. 57 g 0  ?? clopidogrel (PLAVIX) 75 MG tablet Take 1 tablet (75 mg total) by mouth daily. Do Not Fill Before 08/26/2021 30 tablet 0  ?? docusate sodium (COLACE) 100 MG capsule Take 2 capsules (200 mg total) by mouth daily. 60 capsule 0  ?? doxazosin (CARDURA) 1 MG tablet TAKE 1 TABLET BY MOUTH AT BEDTIME. 30 tablet 0  ?? guaiFENesin (MUCINEX) 600 MG 12 hr tablet Take 600 mg by mouth 2 (two) times daily as needed for cough or to loosen phlegm.    ?? levothyroxine (SYNTHROID) 100 MCG tablet Take 1 tablet (100 mcg total) by mouth daily before breakfast. 90 tablet 1  ?? loratadine (CLARITIN) 10 MG tablet Take 1 tablet (10 mg total) by mouth daily as needed for allergies.    ?? melatonin 3 MG TABS tablet Take 1 tablet (3 mg total) by mouth at bedtime as needed. 30 tablet 0  ?? mirabegron ER (MYRBETRIQ) 25 MG TB24 tablet Take 25 mg by mouth at bedtime.    ?? Multiple Vitamins-Minerals (MULTIVITAMIN ADULT) CHEW Chew 2 each by mouth daily.    ?? NONFORMULARY OR COMPOUNDED ITEM 4 wheeled walker   #1  dx frequent falls, weakness 1 each 0  ?? pantoprazole (PROTONIX) 40 MG tablet Take 1 tablet (40 mg total) by mouth daily.    ?? polyvinyl alcohol (LIQUIFILM TEARS) 1.4 % ophthalmic solution Place 1 drop into both eyes daily as needed for dry eyes.    ? ?No current facility-administered medications for this visit.  ? ?No results found. ? ?Review of Systems:   ?A ROS was performed including pertinent positives and negatives as  documented in the HPI. ? ?Physical Exam :   ?Constitutional: NAD and appears stated age ?Neurological: Alert and oriented ?Psych: Appropriate affect and cooperative ?There  were no vitals taken for this visit.  ? ?Comprehensive Musculoskeletal Exam:   ? ?Left shoulder with forward elevation to 90 degrees with pain.  He is pseudo paralytic on this side.  Pain about glenohumeral joint.  There is positive crepitus.  He is wearing a left wrist lacer brace.  2+ radial pulse ? ?Imaging:   ?Xray (3 views left shoulder): ?Significant rotator cuff arthropathy ? ? ?I personally reviewed and interpreted the radiographs. ? ? ?Assessment:   ?88 y.o. male right dominant male with left shoulder rotator cuff arthropathy which is now significantly more symptomatic over the last several months.  Given his work medical coronary disease at this time I recommended ultrasound-guided steroid injection to hopefully get him some relief.  I advised that I can do these multiple times yearly should this wear off.  He would like to proceed with this today. ? ?Plan :   ? ?-Left shoulder ultrasound-guided injection performed after verbal consent obtained ?-Return to clinic as needed ? ? ? ? ?Procedure Note ? ?Patient: [name]             ?Date of Birth: 03/14/1929           ?MRN: [id number]             ?Visit Date: 09/15/2021 ? ?Procedures: ?Visit Diagnoses: No diagnosis found. ? ?Large Joint Inj: L glenohumeral on 09/15/2021 2:17 PM ?Indications: pain ?Details: 22 G 1.5 in needle, ultrasound-guided anterior approach ? ?Arthrogram: No ? ?Medications: 4 mL lidocaine 1 %; 80 mg triamcinolone acetonide 40 MG/ML ?Outcome: tolerated well, no immediate complications ?Procedure, treatment alternatives, risks and benefits explained, specific risks discussed. Consent was given by the patient. Immediately prior to procedure a time out was called to verify the correct patient, procedure, equipment, support staff and site/side marked as required. Patient was  prepped and draped in the usual sterile fashion.  ? ? ? ? ? ?I personally saw and evaluated the patient, and participated in the management and treatment plan. ? ?Vanetta Mulders, MD ?Attending Physician, [facility]

## 2021-09-16 DIAGNOSIS — R2681 Unsteadiness on feet: Secondary | ICD-10-CM | POA: Diagnosis not present

## 2021-09-16 DIAGNOSIS — R2689 Other abnormalities of gait and mobility: Secondary | ICD-10-CM | POA: Diagnosis not present

## 2021-09-16 DIAGNOSIS — M6281 Muscle weakness (generalized): Secondary | ICD-10-CM | POA: Diagnosis not present

## 2021-09-19 ENCOUNTER — Telehealth: Payer: Self-pay | Admitting: Orthopaedic Surgery

## 2021-09-19 ENCOUNTER — Other Ambulatory Visit: Payer: Self-pay | Admitting: Registered Nurse

## 2021-09-19 DIAGNOSIS — M6281 Muscle weakness (generalized): Secondary | ICD-10-CM | POA: Diagnosis not present

## 2021-09-19 DIAGNOSIS — R2689 Other abnormalities of gait and mobility: Secondary | ICD-10-CM | POA: Diagnosis not present

## 2021-09-19 DIAGNOSIS — R2681 Unsteadiness on feet: Secondary | ICD-10-CM | POA: Diagnosis not present

## 2021-09-19 NOTE — Telephone Encounter (Signed)
Ann from [facility] called and needs to know pts WB status and restrictions.  ? ?CB [phone]  ?

## 2021-09-19 NOTE — Telephone Encounter (Signed)
Attempted to return call, LMOM giving CB number to answer questions as it was not a secure VM ? ?

## 2021-09-20 ENCOUNTER — Telehealth: Payer: Self-pay | Admitting: *Deleted

## 2021-09-20 ENCOUNTER — Inpatient Hospital Stay: Payer: Medicare Other | Admitting: Neurology

## 2021-09-20 ENCOUNTER — Telehealth: Payer: Self-pay | Admitting: Neurology

## 2021-09-20 DIAGNOSIS — R2689 Other abnormalities of gait and mobility: Secondary | ICD-10-CM | POA: Diagnosis not present

## 2021-09-20 DIAGNOSIS — R1312 Dysphagia, oropharyngeal phase: Secondary | ICD-10-CM | POA: Diagnosis not present

## 2021-09-20 DIAGNOSIS — R2681 Unsteadiness on feet: Secondary | ICD-10-CM | POA: Diagnosis not present

## 2021-09-20 DIAGNOSIS — R488 Other symbolic dysfunctions: Secondary | ICD-10-CM | POA: Diagnosis not present

## 2021-09-20 DIAGNOSIS — M6281 Muscle weakness (generalized): Secondary | ICD-10-CM | POA: Diagnosis not present

## 2021-09-20 MED ORDER — CLOPIDOGREL BISULFATE 75 MG PO TABS: 75.0000 mg | ORAL_TABLET | Freq: Every day | ORAL | 0 refills | Status: DC

## 2021-09-20 NOTE — Telephone Encounter (Signed)
Pt daughter (on [place]) requesting refill of  clopidogrel (PLAVIX) 75 MG tablet at [id number]?Pt daughter states clopidogrel (PLAVIX) 75 MG tablet was given to pt by Dr. [facility]ie [facility].  ? ?

## 2021-09-20 NOTE — Telephone Encounter (Signed)
Rx sent, pt must keep Hospital fu with Dr. Leonie Man  ?

## 2021-09-20 NOTE — Chronic Care Management (AMB) (Signed)
  Chronic Care Management Note  09/20/2021 Name: [name] MRN: [id number] DOB: 09/11/1928  [name] is a 88 y.o. year old male who is a primary care patient of [name], DO and is actively engaged with the care management team. I reached out to [facility] by phone today to assist with re-scheduling a follow up visit with the Pharmacist  Follow up plan: Unsuccessful telephone outreach attempt made. A HIPAA compliant phone message was left for the patient providing contact information and requesting a return call.   Julian Hy, [facility]  Direct Dial: [phone]

## 2021-09-21 DIAGNOSIS — R2689 Other abnormalities of gait and mobility: Secondary | ICD-10-CM | POA: Diagnosis not present

## 2021-09-21 DIAGNOSIS — M6281 Muscle weakness (generalized): Secondary | ICD-10-CM | POA: Diagnosis not present

## 2021-09-21 DIAGNOSIS — R2681 Unsteadiness on feet: Secondary | ICD-10-CM | POA: Diagnosis not present

## 2021-09-22 DIAGNOSIS — R2689 Other abnormalities of gait and mobility: Secondary | ICD-10-CM | POA: Diagnosis not present

## 2021-09-22 DIAGNOSIS — M6281 Muscle weakness (generalized): Secondary | ICD-10-CM | POA: Diagnosis not present

## 2021-09-22 DIAGNOSIS — R488 Other symbolic dysfunctions: Secondary | ICD-10-CM | POA: Diagnosis not present

## 2021-09-22 DIAGNOSIS — R1312 Dysphagia, oropharyngeal phase: Secondary | ICD-10-CM | POA: Diagnosis not present

## 2021-09-23 ENCOUNTER — Encounter: Payer: Medicare Other | Admitting: Physical Medicine & Rehabilitation

## 2021-09-23 DIAGNOSIS — M6281 Muscle weakness (generalized): Secondary | ICD-10-CM | POA: Diagnosis not present

## 2021-09-23 DIAGNOSIS — R2681 Unsteadiness on feet: Secondary | ICD-10-CM | POA: Diagnosis not present

## 2021-09-26 DIAGNOSIS — R2689 Other abnormalities of gait and mobility: Secondary | ICD-10-CM | POA: Diagnosis not present

## 2021-09-26 DIAGNOSIS — M6281 Muscle weakness (generalized): Secondary | ICD-10-CM | POA: Diagnosis not present

## 2021-09-27 DIAGNOSIS — R1312 Dysphagia, oropharyngeal phase: Secondary | ICD-10-CM | POA: Diagnosis not present

## 2021-09-27 DIAGNOSIS — R2681 Unsteadiness on feet: Secondary | ICD-10-CM | POA: Diagnosis not present

## 2021-09-27 DIAGNOSIS — M6281 Muscle weakness (generalized): Secondary | ICD-10-CM | POA: Diagnosis not present

## 2021-09-27 DIAGNOSIS — R2689 Other abnormalities of gait and mobility: Secondary | ICD-10-CM | POA: Diagnosis not present

## 2021-09-27 DIAGNOSIS — R488 Other symbolic dysfunctions: Secondary | ICD-10-CM | POA: Diagnosis not present

## 2021-09-28 DIAGNOSIS — R2689 Other abnormalities of gait and mobility: Secondary | ICD-10-CM | POA: Diagnosis not present

## 2021-09-28 DIAGNOSIS — M6281 Muscle weakness (generalized): Secondary | ICD-10-CM | POA: Diagnosis not present

## 2021-09-29 DIAGNOSIS — R488 Other symbolic dysfunctions: Secondary | ICD-10-CM | POA: Diagnosis not present

## 2021-09-29 DIAGNOSIS — R2689 Other abnormalities of gait and mobility: Secondary | ICD-10-CM | POA: Diagnosis not present

## 2021-09-29 DIAGNOSIS — M6281 Muscle weakness (generalized): Secondary | ICD-10-CM | POA: Diagnosis not present

## 2021-09-29 DIAGNOSIS — R1312 Dysphagia, oropharyngeal phase: Secondary | ICD-10-CM | POA: Diagnosis not present

## 2021-09-30 DIAGNOSIS — M6281 Muscle weakness (generalized): Secondary | ICD-10-CM | POA: Diagnosis not present

## 2021-09-30 DIAGNOSIS — R2681 Unsteadiness on feet: Secondary | ICD-10-CM | POA: Diagnosis not present

## 2021-09-30 DIAGNOSIS — R2689 Other abnormalities of gait and mobility: Secondary | ICD-10-CM | POA: Diagnosis not present

## 2021-10-03 ENCOUNTER — Other Ambulatory Visit (HOSPITAL_BASED_OUTPATIENT_CLINIC_OR_DEPARTMENT_OTHER): Payer: Self-pay | Admitting: Cardiovascular Disease

## 2021-10-03 ENCOUNTER — Ambulatory Visit (HOSPITAL_BASED_OUTPATIENT_CLINIC_OR_DEPARTMENT_OTHER): Payer: Medicare Other | Admitting: Cardiovascular Disease

## 2021-10-03 ENCOUNTER — Encounter (HOSPITAL_BASED_OUTPATIENT_CLINIC_OR_DEPARTMENT_OTHER): Payer: Self-pay | Admitting: Cardiovascular Disease

## 2021-10-03 DIAGNOSIS — R2689 Other abnormalities of gait and mobility: Secondary | ICD-10-CM | POA: Diagnosis not present

## 2021-10-03 DIAGNOSIS — I4891 Unspecified atrial fibrillation: Secondary | ICD-10-CM

## 2021-10-03 DIAGNOSIS — I63511 Cerebral infarction due to unspecified occlusion or stenosis of right middle cerebral artery: Secondary | ICD-10-CM

## 2021-10-03 DIAGNOSIS — I639 Cerebral infarction, unspecified: Secondary | ICD-10-CM

## 2021-10-03 DIAGNOSIS — I10 Essential (primary) hypertension: Secondary | ICD-10-CM | POA: Diagnosis not present

## 2021-10-03 DIAGNOSIS — M6281 Muscle weakness (generalized): Secondary | ICD-10-CM | POA: Diagnosis not present

## 2021-10-03 MED ORDER — ASPIRIN 325 MG PO TBEC
325.0000 mg | DELAYED_RELEASE_TABLET | Freq: Every day | ORAL | 0 refills | Status: DC
Start: 2021-10-03 — End: 2021-10-31

## 2021-10-03 MED ORDER — DOXAZOSIN MESYLATE 1 MG PO TABS: 1.0000 mg | ORAL_TABLET | Freq: Every day | ORAL | 3 refills | Status: AC

## 2021-10-03 NOTE — Assessment & Plan Note (Addendum)
Multiple infarcts with residual L sided weakness and expressive aphasia.  He also has vertebral and intracerebral artery stenoses.  Therefore we will not be more aggressive with his BP control.  Recommend clarification of BP goals with neurology.  Continue aspirin and clopidogrel.  He has 1 more month of aspirin and then will be on clopidogrel monotherapy indefinitely continue with PT.  Will get a 30 day monitor.   ?

## 2021-10-03 NOTE — Patient Instructions (Signed)
Medication Instructions:  ?Your physician recommends that you continue on your current medications as directed. Please refer to the Current Medication list given to you today.  ?*If you need a refill on your cardiac medications before your next appointment, please call your pharmacy* ? ?Lab Work: ?FASTING LP/CMET SOON  ? ?If you have labs (blood work) drawn today and your tests are completely normal, you will receive your results only by: ?MyChart Message (if you have MyChart) OR ?A paper copy in the mail ?If you have any lab test that is abnormal or we need to change your treatment, we will call you to review the results. ? ?Testing/Procedures: ?Your physician has recommended that you wear an event monitor. Event monitors are medical devices that record the heart?s electrical activity. Doctors most often [place] these monitors to diagnose arrhythmias. Arrhythmias are problems with the speed or rhythm of the heartbeat. The monitor is a small, portable device. You can wear one while you do your normal daily activities. This is usually used to diagnose what is causing palpitations/syncope (passing out). ?THIS WILL BE MAILED TO YOU  ? ?Follow-Up: ?At [facility], you and your health needs are our priority.  As part of our continuing mission to provide you with exceptional heart care, we have created designated Provider Care Teams.  These Care Teams include your primary Cardiologist (physician) and Advanced Practice Providers (APPs -  Physician Assistants and Nurse Practitioners) who all work together to provide you with the care you need, when you need it. ? ?We recommend signing up for the patient portal called "MyChart".  Sign up information is provided on this After Visit Summary.  MyChart is used to connect with patients for Virtual Visits (Telemedicine).  Patients are able to view lab/test results, encounter notes, upcoming appointments, etc.  Non-urgent messages can be sent to your provider as well.   ?To learn  more about what you can do with MyChart, go to [email or web address].   ? ?Your next appointment:   ?6 month(s) ? ?The format for your next appointment:   ?In Person ? ?Provider:   ?Skeet Latch, MD  ? ?Other Instructions ? ?[employer] Cardiac Event Monitor Instructions ?Your physician has requested you wear your cardiac event monitor for __30___ days, (1-30). ?[employer] may call or text to confirm a shipping address. The monitor will be sent to a land address via UPS. ?[employer] will not ship a monitor to a PO BOX. It typically takes 3-5 days to receive your monitor after it has ?been enrolled. [employer] will assist with USPS tracking if your package is delayed. The telephone number for ?[employer] is [phone]. ?Once you have received your monitor, please review the enclosed instructions. Instruction tutorials can also be ?viewed under help and settings on the enclosed cell phone. Your monitor has already been registered assigning ?a specific monitor serial # to you. ? ?Applying the monitor ?Remove cell phone from case and turn it on. The cell phone works as Dealer and needs to be within ?10 feet of you at all times. The cell phone will need to be charged on a daily basis. We recommend you plug the ?cell phone into the enclosed charger at your bedside table every night. ? ?Monitor batteries: You will receive two monitor batteries labelled #1 and #2. These are your recorders. Plug ?battery #2 onto the second connection on the enclosed charger. Keep one battery on the charger at all times. ?This will keep the monitor battery deactivated. It will also keep it  fully charged for when you need to switch ?your monitor batteries. A small light will be blinking on the battery emblem when it is charging. The light on the ?battery emblem will remain on when the battery is fully charged. ? ?Open package of a Monitor strip. Insert battery #1 into black hood on strip and gently squeeze monitor  battery ?onto connection as indicated in instruction booklet. Set aside while preparing skin. ? ?Choose location for your strip, vertical or horizontal, as indicated in the instruction booklet. Shave to remove all ?hair from location. There cannot be any lotions, oils, powders, or colognes on skin where monitor is to be ?applied. Wipe skin clean with enclosed Saline wipe. Dry skin completely. ? ?Peel paper labeled #1 off the back of the Monitor strip exposing the adhesive. Place the monitor on the chest in ?the vertical or horizontal position shown in the instruction booklet. One arrow on the monitor strip must be ?pointing upward. Carefully remove paper labeled #2, attaching remainder of strip to your skin. Try not to ?create any folds or wrinkles in the strip as you apply it. ? ?Firmly press and release the circle in the center of the monitor battery. You will hear a small beep. This is ?turning the monitor battery on. The heart emblem on the monitor battery will light up every 5 seconds if the ?monitor battery in turned on and connected to the patient securely. Do not push and hold the circle down as ?this turns the monitor battery off. The cell phone will locate the monitor battery. A screen will appear on the ?cell phone checking the connection of your monitor strip. This may read poor connection initially but change to ?good connection within the next minute. Once your monitor accepts the connection you will hear a series of 3 ?beeps followed by a climbing crescendo of beeps. A screen will appear on the cell phone showing the two ?monitor strip placement options. Touch the picture that demonstrates where you applied the monitor strip. ? ?Your monitor strip and battery are waterproof. You are able to shower, bathe, or swim with the monitor on. ?They just ask you do not submerge deeper than 3 feet underwater. We recommend removing the monitor if ?you are swimming in a lake, river, or ocean. ? ?Your monitor  battery will need to be switched to a fully charged monitor battery approximately once a week. ?The cell phone will alert you of an action which needs to be made. ? ?On the cell phone, tap for details to reveal connection status, monitor battery status, and cell phone battery ?status. The green dots indicates your monitor is in good status. A red dot indicates there is something that ?needs your attention. ? ?To record a symptom, click the circle on the monitor battery. In 30-60 seconds a list of symptoms will appear on ?the cell phone. Select your symptom and tap save. Your monitor will record a sustained or significant ?arrhythmia regardless of you clicking the button. Some patients do not feel the heart rhythm irregularities. ?[employer] will notify us of any serious or critical events. ? ?Refer to instruction booklet for instructions on switching batteries, changing strips, the Do not disturb or Pause ?features, or any additional questions. ? ?Call [employer] at [phone], to confirm your monitor is transmitting and record your baseline. They will ?answer any questions you may have regarding the monitor instructions at that time. ? ?Returning the monitor to [employer] ?Place all equipment back into blue box. Peel off strip  of paper to expose adhesive and close box securely. There ?is a prepaid UPS shipping label on this box. Drop in a UPS drop box, or at a UPS facility like [employer]. You may ?also contact [employer] to arrange UPS to pick up monitor package at your home. ? ? ? ?

## 2021-10-03 NOTE — Assessment & Plan Note (Signed)
Blood pressure is elevated today.  However given his intracranial stenosis and frequent falls, will not be more aggressive.  Continue doxazosin.  When he sees neurology recommend getting a formal recommendation for his blood pressure goal. ?

## 2021-10-03 NOTE — Progress Notes (Unsigned)
? ? ?Cardiology Office Note ? ? ?Date:  10/03/2021  ? ?ID:  [name], DOB Jul 29, 1928, MRN [id number] ? ?PCP:  [name], DO  ?Cardiologist:   Skeet Latch, MD  ? ?No chief complaint on file. ? ? ?  ?History of Present Illness: ?[name] is a 88 y.o. male with stroke, hypertension, hyperlipidemia, colon cancer, and hypothyroidism who is being seen today for the evaluation of stroke at the request of Love, Ivan Anchors, PA-C. ?He had previously had a stroke in 2019.  He was admitted 06/2021 with recurrent falls and left-sided weakness that have been ongoing for a few days.  MRI/MRI revealed small acute perforator infarct at the right corona radiata and extensive small vessel ischemic disease.  He had chronic small vessel infarcts of the cerebellum and subcortical brain.  He also had evidence of brain atrophy.  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  He had 50% stenosis of the left vertebral origin. ?He previously saw Dr. Harrington Challenger in 2013.  At the time he reported chest pain.  A 30-day monitor was recommended to evaluate for atrial fibrillation.  Aspirin was increased to 325 mg and clopidogrel was added.  They plan for 3 months of DAPT followed by clopidogrel alone.  He was noted to have left-sided weakness with ataxia and balance deficits as well as expressive deficits and delayed processing.  They recommended CIR at discharge.  Echo revealed LVEF 60 to 65% with grade 1 diastolic dysfunction.  They were unable to exclude a small PFO.  A 30-day monitor was recommended but never completed. ? ?He is struggled with frequent falls.  He recently slipped and fell on the floor and tore his rotator cuff.  He is at [employer]. He goes home next week.  He is feeling well and denies CP/SOB.  His BP has been 140s/60s each AM.  He has mild ankle edema but no orthopnea or PND.  In general he has felt well.  In the past his blood pressure got low on losartan and it was  discontinued.  His daughter notes that he has intermittent confusion.  Today seems to be worse than usual. ? ? ?Past Medical History:  ?Diagnosis Date  ? Adenocarcinoma ([place]) 1985  ? colon s/p right hemicolectomy and chemotherapy. F/U with cancer center and Dr. Lucia Gaskins rotinely  ? Depression   ? Eczema   ? Epistaxis   ? f/u per ENT  ? Erectile dysfunction   ? Gastric ulcer 12/11  ? H-Pylori Tx, EGD 3-12: gastritis  ? Hay fever   ? Head injury 11/2017  ? with fall  ? Hyperlipemia   ? Hypertension   ? Hypothyroidism   ? Hypothyroid  ? Insomnia   ? Osteopenia   ?  per DEXA 07-2008 (Rx fosamax)  ? Rheumatoid arthritis ([place])   ? Stroke [facility])   ?  " balance issue"  ? ? ?Past Surgical History:  ?Procedure Laterality Date  ? COLON SURGERY    ? Right / Adenocarcinoma / Chemo  ? COLONOSCOPY  2005, 2008  ? Dr. Lucia Gaskins  ? INGUINAL HERNIA REPAIR    ? Right  ? IR ANGIO INTRA EXTRACRAN SEL COM CAROTID INNOMINATE BILAT MOD SED  12/13/2017  ? IR ANGIO VERTEBRAL SEL VERTEBRAL BILAT MOD SED  12/13/2017  ? IR PTA INTRACRANIAL  12/18/2017  ? RADIOLOGY WITH ANESTHESIA N/A 12/18/2017  ? Procedure: Angioplasty with stenting;  Surgeon: Luanne Bras, MD;  Location:  [place] OR;  Service: Radiology;  Laterality: N/A;  ? ? ? ?Current Outpatient Medications  ?Medication Sig Dispense Refill  ? acetaminophen (TYLENOL) 325 MG tablet Take 1-2 tablets (325-650 mg total) by mouth every 4 (four) hours as needed for mild pain.    ? acetaminophen (TYLENOL) 500 MG tablet Take 1 tablet (500 mg total) by mouth at bedtime. 100 tablet 0  ? atorvastatin (LIPITOR) 40 MG tablet Take 1 tablet (40 mg total) by mouth daily. 90 tablet 1  ? capsicum (ZOSTRIX) 0.075 % topical cream Apply 1 application. topically 4 (four) times daily -  with meals and at bedtime. Be sure to wear glove and do not touch eye, mouth or nose (any mucous membranes) Apply to left shoulder. 57 g 0  ? clopidogrel (PLAVIX) 75 MG tablet Take 1 tablet (75 mg total) by mouth daily. Do Not Fill Before  08/26/2021 30 tablet 0  ? docusate sodium (COLACE) 100 MG capsule Take 2 capsules (200 mg total) by mouth daily. 60 capsule 0  ? levothyroxine (SYNTHROID) 100 MCG tablet Take 1 tablet (100 mcg total) by mouth daily before breakfast. 90 tablet 1  ? melatonin 3 MG TABS tablet Take 1 tablet (3 mg total) by mouth at bedtime as needed. 30 tablet 0  ? mirabegron ER (MYRBETRIQ) 25 MG TB24 tablet Take 25 mg by mouth at bedtime.    ? Multiple Vitamins-Minerals (MULTIVITAMIN ADULT) CHEW Chew 2 each by mouth daily.    ? NONFORMULARY OR COMPOUNDED ITEM 4 wheeled walker   #1  dx frequent falls, weakness 1 each 0  ? aspirin 325 MG EC tablet Take 1 tablet (325 mg total) by mouth daily. 30 tablet 0  ? doxazosin (CARDURA) 1 MG tablet Take 1 tablet (1 mg total) by mouth at bedtime. 90 tablet 3  ? ?No current facility-administered medications for this visit.  ? ? ?Allergies:   Methotrexate derivatives, Asa [aspirin], and Penicillin g  ? ? ?Social History:  The patient  reports that he has never smoked. He has never used smokeless tobacco. He reports that he does not drink alcohol and does not use drugs.  ? ?Family History:  The patient's family history is not on file.  ? ? ?ROS:  Please see the history of present illness.   Otherwise, review of systems are positive for none.   All other systems are reviewed and negative.  ? ? ?PHYSICAL EXAM: ?VS:  BP (!) 144/60   Pulse 71   Ht '4\' 11"'$  (1.499 m)   Wt 126 lb 9.6 oz (57.4 kg)   SpO2 97%   BMI 25.57 kg/m?  , BMI Body mass index is 25.57 kg/m?. ?GENERAL:  Well appearing ?HEENT:  Pupils equal round and reactive, fundi not visualized, oral mucosa unremarkable ?NECK:  No jugular venous distention, waveform within normal limits, carotid upstroke brisk and symmetric, no bruits, no thyromegaly ?LUNGS:  Clear to auscultation bilaterally ?HEART:  RRR.  PMI not displaced or sustained,S1 and S2 within normal limits, no S3, no S4, no clicks, no rubs, no murmurs ?ABD:  Flat, positive bowel sounds  normal in frequency in pitch, no bruits, no rebound, no guarding, no midline pulsatile mass, no hepatomegaly, no splenomegaly ?EXT:  2 plus pulses throughout, no edema, no cyanosis no clubbing ?SKIN:  No rashes no nodules ?NEURO:  Cranial nerves II through XII grossly intact, motor grossly intact throughout ?PSYCH:  Intermittently confused.  Answers questions appropriately but also talks bout remote events and confuses episodes ? ? ?  EKG:  EKG is not ordered today. ?The ekg ordered today demonstrates  ? ?Echo 06/29/21: ?IMPRESSIONS  ? ? ? 1. Left ventricular ejection fraction, by estimation, is 60 to 65%. The  ?left ventricle has normal function. The left ventricle has no regional  ?wall motion abnormalities. Left ventricular diastolic parameters are  ?consistent with Grade I diastolic  ?dysfunction (impaired relaxation).  ? 2. Right ventricular systolic function is normal. The right ventricular  ?size is normal. Tricuspid regurgitation signal is inadequate for assessing  ?PA pressure.  ? 3. The mitral valve is grossly normal. Trivial mitral valve  ?regurgitation.  ? 4. The aortic valve is grossly normal. Aortic valve regurgitation is  ?mild.  ? 5. The inferior vena cava is normal in size with greater than 50%  ?respiratory variability, suggesting right atrial pressure of 3 mmHg.  ? 6. Cannot exclude a small PFO.  ? ?Brain/Neck MRI/MRA 06/2021: ?Brain MRI: ?  ?1. Small acute perforator infarct at the right corona radiata. ?2. Extensive chronic small vessel ischemia. Chronic small vessel ?infarcts in the cerebellum and subcortical brain. ?3. Brain atrophy. ?Intracranial MRA: ?  ?1. Advanced and diffuse atherosclerosis. ?2. Advanced right M1 segment stenosis that is new from a 2020 CTA. ?3. 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 Chronic high-grade ?narrowing in the left V4 segment and basilar, non progressed from ?2020. ?  ?Neck MRA: ?  ?1. 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 ?2. 50% narrowing at  the left vertebral origin, chronic when compared ?to 2020 CTA. ?3. No stenosis of the cervical common and internal carotids. ?  ? ?Recent Labs: ?07/01/2021: Magnesium 2.2 ?08/27/2021: Hemoglobin 13.6; Platelets 206

## 2021-10-03 NOTE — Assessment & Plan Note (Signed)
He has previously been on gemfibrozil.  This was discontinued in the hospital and he is now on atorvastatin.  We will check fasting lipids and CMP.  LDL goal is less than 70. ?

## 2021-10-04 DIAGNOSIS — R2689 Other abnormalities of gait and mobility: Secondary | ICD-10-CM | POA: Diagnosis not present

## 2021-10-04 DIAGNOSIS — M6281 Muscle weakness (generalized): Secondary | ICD-10-CM | POA: Diagnosis not present

## 2021-10-05 DIAGNOSIS — R2689 Other abnormalities of gait and mobility: Secondary | ICD-10-CM | POA: Diagnosis not present

## 2021-10-05 DIAGNOSIS — M6281 Muscle weakness (generalized): Secondary | ICD-10-CM | POA: Diagnosis not present

## 2021-10-07 NOTE — Chronic Care Management (AMB) (Signed)
  Chronic Care Management Note  10/07/2021 Name: Brydon Spahr MRN: [id number] DOB: 1928/08/16  Yaser Harvill is a 88 y.o. year old male who is a primary care patient of [name], DO and is actively engaged with the care management team. I reached out to [facility] by phone today to assist with re-scheduling a follow up visit with the Pharmacist  Follow up plan: 2nd Unsuccessful telephone outreach attempt made. A HIPAA compliant phone message was left for the patient providing contact information and requesting a return call.   Julian Hy, [facility]  Direct Dial: [phone]

## 2021-10-10 ENCOUNTER — Telehealth: Payer: Self-pay | Admitting: Cardiovascular Disease

## 2021-10-10 NOTE — Telephone Encounter (Signed)
Pt. Appointment was as [employer] [facility] [address].

## 2021-10-10 NOTE — Telephone Encounter (Signed)
Patient's daughter called and cancelled event monitor appointment scheduled for today, 5/22 at 12:00 PM. She states the patient's caregiver took the patient to the wrong location and she is only with the patient 8:00 AM - 12:00 PM. Monitor coordinator unavailable for advisement. Please return call to reschedule when able.

## 2021-10-11 ENCOUNTER — Ambulatory Visit (INDEPENDENT_AMBULATORY_CARE_PROVIDER_SITE_OTHER): Payer: Medicare Other | Admitting: Family Medicine

## 2021-10-11 ENCOUNTER — Ambulatory Visit (HOSPITAL_BASED_OUTPATIENT_CLINIC_OR_DEPARTMENT_OTHER)
Admission: RE | Admit: 2021-10-11 | Discharge: 2021-10-11 | Disposition: A | Discharge status: Home / Self Care | Payer: Medicare Other | Source: Ambulatory Visit | Attending: Family Medicine | Admitting: Family Medicine

## 2021-10-11 ENCOUNTER — Encounter: Payer: Self-pay | Admitting: Family Medicine

## 2021-10-11 VITALS — BP 118/70 | HR 68 | Temp 97.9°F | Resp 16 | Ht 59.0 in

## 2021-10-11 DIAGNOSIS — R051 Acute cough: Secondary | ICD-10-CM | POA: Insufficient documentation

## 2021-10-11 DIAGNOSIS — R059 Cough, unspecified: Secondary | ICD-10-CM | POA: Diagnosis not present

## 2021-10-11 DIAGNOSIS — Z741 Need for assistance with personal care: Secondary | ICD-10-CM | POA: Insufficient documentation

## 2021-10-11 DIAGNOSIS — J40 Bronchitis, not specified as acute or chronic: Secondary | ICD-10-CM | POA: Insufficient documentation

## 2021-10-11 MED ORDER — AZITHROMYCIN 250 MG PO TABS: ORAL_TABLET | ORAL | 0 refills | Status: DC

## 2021-10-11 NOTE — Assessment & Plan Note (Signed)
Refer to Home healthy for OT

## 2021-10-11 NOTE — Progress Notes (Signed)
Established Patient Office Visit  Subjective   Patient ID: [name], male    DOB: 1929/05/13  Age: 88 y.o. MRN: [id number]  Chief Complaint  Patient presents with   Cough    X4 days, Pt having coughing fits that lead to choking episodes. Negative COVID test    HPI Pt is here with his wife and translator -- -c/o cough x 4 days.   Sounds like hes choking   no fever  He has been weaker too.   They are asking for occ therapy at home--- he gets pt right now   Patient Active Problem List   Diagnosis Date Noted   Requires assistance with activities of daily living (ADL) 10/11/2021   Bronchitis 10/11/2021   Trapezium fracture 09/09/2021   CMC arthritis, thumb, degenerative 09/09/2021   Left wrist pain 09/05/2021   Radial nerve palsy, left 09/05/2021   Acute shoulder pain due to trauma, left 07/29/2021   Slow transit constipation    Stroke (cerebrum) ([place]) 07/07/2021   CVA (cerebral vascular accident) [facility]) with intracranial vascular stenosis 06/29/2021   Intracranial vascular stenosis 06/29/2021   Prediabetes 06/29/2021   Normocytic anemia 06/29/2021   History of stroke 06/29/2021   Other drug-induced neutropenia ([place]) 06/09/2021   Malignant neoplasm of ascending colon ([place]) 06/09/2021   Stenosis of artery ([place]) 06/09/2021   Preventative health care 03/01/2021   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   Kidney disease, chronic, stage III (moderate, EGFR 30-59 ml/min) ([place]) 08/07/2020   Leukopenia 08/07/2020   Acute respiratory failure with hypoxia ([place]) 08/07/2020   SIRS (systemic inflammatory response syndrome) ([place]) 08/07/2020   Methotrexate adverse reaction, initial encounter    Acute pain of right shoulder 04/14/2020   Lower extremity edema 11/13/2019   Head trauma 11/13/2019   Dyslipidemia 11/13/2019   Memory loss 07/28/2019   Chronic right shoulder pain 07/28/2019   Frequent falls 07/28/2019   Balance problem 07/28/2019   Dysphagia 07/28/2019   Traumatic hemorrhage of  cerebrum, unspecified, without loss of consciousness, initial encounter ([place])    Basilar artery stenosis with infarction ([place]) 12/18/2017   TIA (transient ischemic attack) 12/10/2017   Rheumatoid arthritis ([place])    Chest pain 06/02/2011   General medical examination 04/11/2011   PEPTIC ULCER DISEASE, HELICOBACTER PYLORI POSITIVE 07/12/2010   PERSONAL HISTORY MALIG NEOPLASM LARGE INTESTINE 05/18/2010   MITRAL VALVE DISORDERS 04/05/2009   HOARSENESS 12/04/2008   OSTEOPENIA 08/26/2008   HYPERTRIGLYCERIDEMIA 07/23/2008   EPISTAXIS 07/26/2007   ERECTILE DYSFUNCTION 02/22/2007   ECZEMA 12/07/2006   LEG CRAMPS 10/09/2006   Hypothyroidism 08/28/2006   Essential hypertension 08/28/2006   HAY FEVER 08/28/2006   INSOMNIA 08/28/2006   Past Medical History:  Diagnosis Date   Adenocarcinoma ([place]) 1985   colon s/p right hemicolectomy and chemotherapy. F/U with cancer center and Dr. Lucia Gaskins rotinely   Depression    Eczema    Epistaxis    f/u per ENT   Erectile dysfunction    Gastric ulcer 12/11   H-Pylori Tx, EGD 3-12: gastritis   Hay fever    Head injury 11/2017   with fall   Hyperlipemia    Hypertension    Hypothyroidism    Hypothyroid   Insomnia    Osteopenia     per DEXA 07-2008 (Rx fosamax)   Rheumatoid arthritis ([place])    Stroke ([place])     " balance issue"   Past Surgical History:  Procedure Laterality Date   COLON SURGERY     Right / Adenocarcinoma /  Chemo   COLONOSCOPY  2005, 2008   Dr. Lucia Gaskins   INGUINAL HERNIA REPAIR     Right   IR ANGIO INTRA EXTRACRAN SEL COM CAROTID INNOMINATE BILAT MOD SED  12/13/2017   IR ANGIO VERTEBRAL SEL VERTEBRAL BILAT MOD SED  12/13/2017   IR PTA INTRACRANIAL  12/18/2017   RADIOLOGY WITH ANESTHESIA N/A 12/18/2017   Procedure: Angioplasty with stenting;  Surgeon: Luanne Bras, MD;  Location: [place];  Service: Radiology;  Laterality: N/A;   Social History   Tobacco Use   Smoking status: Never   Smokeless tobacco: Never  Vaping Use    Vaping Use: Never used  Substance Use Topics   Alcohol use: No    Alcohol/week: 0.0 standard drinks   Drug use: No   Social History   Socioeconomic History   Marital status: Married    Spouse name: Not on file   Number of children: 2   Years of education: Not on file   Highest education level: Not on file  Occupational History    Employer: RETIRED  Tobacco Use   Smoking status: Never   Smokeless tobacco: Never  Vaping Use   Vaping Use: Never used  Substance and Sexual Activity   Alcohol use: No    Alcohol/week: 0.0 standard drinks   Drug use: No   Sexual activity: Not on file  Other Topics Concern   Not on file  Social History Narrative   Original from [place]   Vegetarian   Daily caffeine use one per day   Social Determinants of Health   Financial Resource Strain: Low Risk    Difficulty of Paying Living Expenses: Not hard at all  Food Insecurity: Not on file  Transportation Needs: Not on file  Physical Activity: Inactive   Days of Exercise per Week: 0 days   Minutes of Exercise per Session: 0 min  Stress: Not on file  Social Connections: Not on file  Intimate Partner Violence: Not on file   Family Status  Relation Name Status   Mother  Deceased   Father  Deceased   Neg Hx  (Not Specified)   Family History  Problem Relation Age of Onset   Diabetes Neg Hx    Heart attack Neg Hx    Colon cancer Neg Hx    Prostate cancer Neg Hx    Allergies  Allergen Reactions   Methotrexate Derivatives Other (See Comments)    Mouth sores / pancytopenia   Asa [Aspirin] Other (See Comments)    Gastric symptoms - can tolerate 81 mg aspirin   Penicillin G Rash    Did it involve swelling of the face/tongue/throat, SOB, or low BP? Unknown Did it involve sudden or severe rash/hives, skin peeling, or any reaction on the inside of your mouth or nose? Unknown Did you need to seek medical attention at a hospital or doctor's office? Unknown When did it last happen?   unknown     If all above answers are "NO", may proceed with cephalosporin use.      Review of Systems  Constitutional:  Negative for fever and malaise/fatigue.  HENT:  Negative for congestion.   Eyes:  Negative for blurred vision.  Respiratory:  Positive for cough. Negative for sputum production and shortness of breath.   Cardiovascular:  Negative for chest pain, palpitations and leg swelling.  Gastrointestinal:  Negative for vomiting.  Musculoskeletal:  Negative for back pain.  Skin:  Negative for rash.  Neurological:  Positive for weakness. Negative  for loss of consciousness and headaches.     Objective:     BP 118/70 (BP Location: Left Arm, Patient Position: Sitting, Cuff Size: Normal)   Pulse 68   Temp 97.9 F (36.6 C) (Oral)   Resp 16   Ht '4\' 11"'  (1.499 m)   SpO2 100%   BMI 25.57 kg/m  BP Readings from Last 3 Encounters:  10/11/21 118/70  10/03/21 (!) 144/60  09/05/21 140/74   Wt Readings from Last 3 Encounters:  10/03/21 126 lb 9.6 oz (57.4 kg)  09/09/21 125 lb (56.7 kg)  08/27/21 125 lb (56.7 kg)   SpO2 Readings from Last 3 Encounters:  10/11/21 100%  10/03/21 97%  09/05/21 97%      Physical Exam Vitals and nursing note reviewed.  Constitutional:      Appearance: He is well-developed.  HENT:     Head: Normocephalic and atraumatic.  Eyes:     Pupils: Pupils are equal, round, and reactive to light.  Neck:     Thyroid: No thyromegaly.  Cardiovascular:     Rate and Rhythm: Normal rate and regular rhythm.     Heart sounds: No murmur heard. Pulmonary:     Effort: Pulmonary effort is normal. No respiratory distress.     Breath sounds: Normal breath sounds. No wheezing or rales.  Chest:     Chest wall: No tenderness.  Musculoskeletal:     Cervical back: Normal range of motion and neck supple.     Right hip: Tenderness present. Normal range of motion. Normal strength.     Left hip: Tenderness present. Normal range of motion. Normal strength.     Right foot: Bony  tenderness present. No swelling.     Left foot: Bony tenderness present. No swelling.  Skin:    General: Skin is warm and dry.  Neurological:     Mental Status: He is alert and oriented to person, place, and time.  Psychiatric:        Behavior: Behavior normal.        Thought Content: Thought content normal.        Judgment: Judgment normal.     No results found for any visits on 10/11/21.  Last CBC Lab Results  Component Value Date   WBC 11.6 (H) 08/27/2021   HGB 13.6 08/27/2021   HCT 42.4 08/27/2021   MCV 93.0 08/27/2021   MCH 29.8 08/27/2021   RDW 13.1 08/27/2021   PLT 206 73/42/8768   Last metabolic panel Lab Results  Component Value Date   GLUCOSE 106 (H) 09/05/2021   NA 142 09/05/2021   K 4.3 09/05/2021   CL 106 09/05/2021   CO2 29 09/05/2021   BUN 28 (H) 09/05/2021   CREATININE 1.45 09/05/2021   GFRNONAA 40 (L) 08/27/2021   CALCIUM 9.5 09/05/2021   PROT 7.0 09/05/2021   ALBUMIN 3.9 09/05/2021   LABGLOB 3.1 08/06/2020   AGRATIO 1.5 08/06/2020   BILITOT 0.2 09/05/2021   ALKPHOS 74 09/05/2021   AST 27 09/05/2021   ALT 36 09/05/2021   ANIONGAP 8 08/27/2021   Last lipids Lab Results  Component Value Date   CHOL 128 09/05/2021   HDL 36.50 (L) 09/05/2021   LDLCALC 102 (H) 06/29/2021   LDLDIRECT 51.0 09/05/2021   TRIG 375.0 (H) 09/05/2021   CHOLHDL 4 09/05/2021   Last hemoglobin A1c Lab Results  Component Value Date   HGBA1C 5.8 (H) 06/29/2021   Last thyroid functions Lab Results  Component Value Date  TSH 1.30 09/05/2021   T4TOTAL 7.3 04/13/2020   Last vitamin D Lab Results  Component Value Date   VD25OH 49.40 04/13/2020   Last vitamin B12 and Folate Lab Results  Component Value Date   VITAMINB12 341 07/01/2021   FOLATE 45.8 08/10/2020      The ASCVD Risk score (Arnett DK, et al., 2019) failed to calculate for the following reasons:   The 2019 ASCVD risk score is only valid for ages 101 to 18   The patient has a prior MI or stroke  diagnosis    Assessment & Plan:   Problem List Items Addressed This Visit       Unprioritized   Requires assistance with activities of daily living (ADL)    Refer to Home healthy for OT        Relevant Orders   Ambulatory referral to [facility]    abx per orders cxr  ? Aspiration        Other Visit Diagnoses     Acute cough       Relevant Medications   azithromycin (ZITHROMAX Z-PAK) 250 MG tablet   Other Relevant Orders   DG Chest 2 View       Return if symptoms worsen or fail to improve.    [name], DO

## 2021-10-11 NOTE — Patient Instructions (Signed)
Acute Bronchitis, Adult ? ?Acute bronchitis is sudden inflammation of the main airways (bronchi) that come off the windpipe (trachea) in the lungs. The swelling causes the airways to get smaller and make more mucus than normal. This can make it hard to breathe and can cause coughing or noisy breathing (wheezing). ?Acute bronchitis may last several weeks. The cough may last longer. Allergies, asthma, and exposure to smoke may make the condition worse. ?What are the causes? ?This condition can be caused by germs and by substances that irritate the lungs, including: ?Cold and flu viruses. The most common cause of this condition is the virus that causes the common cold. ?Bacteria. This is less common. ?Breathing in substances that irritate the lungs, including: ?Smoke from cigarettes and other forms of tobacco. ?Dust and pollen. ?Fumes from household cleaning products, gases, or burned fuel. ?Indoor or outdoor air pollution. ?What increases the risk? ?The following factors may make you more likely to develop this condition: ?A weak body's defense system, also called the immune system. ?A condition that affects your lungs and breathing, such as asthma. ?What are the signs or symptoms? ?Common symptoms of this condition include: ?Coughing. This may bring up clear, yellow, or green mucus from your lungs (sputum). ?Wheezing. ?Runny or stuffy nose. ?Having too much mucus in your lungs (chest congestion). ?Shortness of breath. ?Aches and pains, including sore throat or chest. ?How is this diagnosed? ?This condition is usually diagnosed based on: ?Your symptoms and medical history. ?A physical exam. ?You may also have other tests, including tests to rule out other conditions, such as pneumonia. These tests include: ?A test of lung function. ?Test of a mucus sample to look for the presence of bacteria. ?Tests to check the oxygen level in your blood. ?Blood tests. ?Chest X-ray. ?How is this treated? ?Most cases of acute  bronchitis clear up over time without treatment. Your health care provider may recommend: ?Drinking more fluids to help thin your mucus so it is easier to cough up. ?Taking inhaled medicine (inhaler) to improve air flow in and out of your lungs. ?Using a vaporizer or a humidifier. These are machines that add water to the air to help you breathe better. ?Taking a medicine that thins mucus and clears congestion (expectorant). ?Taking a medicine that prevents or stops coughing (cough suppressant). ?It is notcommon to take an antibiotic medicine for this condition. ?Follow these instructions at home: ? ?Take over-the-counter and prescription medicines only as told by your health care provider. ?Use an inhaler, vaporizer, or humidifier as told by your health care provider. ?Take two teaspoons (10 mL) of honey at bedtime to lessen coughing at night. ?Drink enough fluid to keep your urine pale yellow. ?Do not use any products that contain nicotine or tobacco. These products include cigarettes, chewing tobacco, and vaping devices, such as e-cigarettes. If you need help quitting, ask your health care provider. ?Get plenty of rest. ?Return to your normal activities as told by your health care provider. Ask your health care provider what activities are safe for you. ?Keep all follow-up visits. This is important. ?How is this prevented? ?To lower your risk of getting this condition again: ?Wash your hands often with soap and water for at least 20 seconds. If soap and water are not available, use hand sanitizer. ?Avoid contact with people who have cold symptoms. ?Try not to touch your mouth, nose, or eyes with your hands. ?Avoid breathing in smoke or chemical fumes. Breathing smoke or chemical fumes will make your   condition worse. ?Get the flu shot every year. ?Contact a health care provider if: ?Your symptoms do not improve after 2 weeks. ?You have trouble coughing up the mucus. ?Your cough keeps you awake at night. ?You have a  fever. ?Get help right away if you: ?Cough up blood. ?Feel pain in your chest. ?Have severe shortness of breath. ?Faint or keep feeling like you are going to faint. ?Have a severe headache. ?Have a fever or chills that get worse. ?These symptoms may represent a serious problem that is an emergency. Do not wait to see if the symptoms will go away. Get medical help right away. Call your local emergency services (911 in the [place] Do not drive yourself to the hospital. ?Summary ?Acute bronchitis is inflammation of the main airways (bronchi) that come off the windpipe (trachea) in the lungs. The swelling causes the airways to get smaller and make more mucus than normal. ?Drinking more fluids can help thin your mucus so it is easier to cough up. ?Take over-the-counter and prescription medicines only as told by your health care provider. ?Do not use any products that contain nicotine or tobacco. These products include cigarettes, chewing tobacco, and vaping devices, such as e-cigarettes. If you need help quitting, ask your health care provider. ?Contact a health care provider if your symptoms do not improve after 2 weeks. ?This information is not intended to replace advice given to you by your health care provider. Make sure you discuss any questions you have with your health care provider. ?Document Revised: 09/08/2020 Document Reviewed: 09/08/2020 ?Elsevier Patient Education ? 2023 [employer] ? ?

## 2021-10-11 NOTE — Assessment & Plan Note (Signed)
abx per orders cxr  ? Aspiration

## 2021-10-17 ENCOUNTER — Other Ambulatory Visit: Payer: Self-pay | Admitting: Neurology

## 2021-10-19 ENCOUNTER — Ambulatory Visit: Payer: Medicare Other | Attending: Family Medicine

## 2021-10-19 DIAGNOSIS — M6281 Muscle weakness (generalized): Secondary | ICD-10-CM | POA: Diagnosis not present

## 2021-10-19 DIAGNOSIS — I6330 Cerebral infarction due to thrombosis of unspecified cerebral artery: Secondary | ICD-10-CM | POA: Insufficient documentation

## 2021-10-19 DIAGNOSIS — Z8673 Personal history of transient ischemic attack (TIA), and cerebral infarction without residual deficits: Secondary | ICD-10-CM | POA: Insufficient documentation

## 2021-10-19 DIAGNOSIS — R279 Unspecified lack of coordination: Secondary | ICD-10-CM | POA: Diagnosis not present

## 2021-10-19 DIAGNOSIS — R2689 Other abnormalities of gait and mobility: Secondary | ICD-10-CM | POA: Diagnosis not present

## 2021-10-19 NOTE — Therapy (Signed)
OUTPATIENT PHYSICAL THERAPY NEURO EVALUATION   Patient Name: [name] MRN: [id number] DOB:Jun 09, 1928, 88 y.o., male Today's Date: 10/19/2021   PCP: [name] REFERRING PROVIDER: Garnet Koyanagi Chase   PT End of Session - 10/19/21 1304     Visit Number 1    Date for PT Re-Evaluation 01/11/22    PT Start Time 1311    PT Stop Time 1400    PT Time Calculation (min) 49 min    Equipment Utilized During Treatment Gait belt    Activity Tolerance Patient tolerated treatment well    Behavior During Therapy WFL for tasks assessed/performed   Confused            Past Medical History:  Diagnosis Date   Adenocarcinoma ([place]) 1985   colon s/p right hemicolectomy and chemotherapy. F/U with cancer center and Dr. Lucia Gaskins rotinely   Depression    Eczema    Epistaxis    f/u per ENT   Erectile dysfunction    Gastric ulcer 12/11   H-Pylori Tx, EGD 3-12: gastritis   Hay fever    Head injury 11/2017   with fall   Hyperlipemia    Hypertension    Hypothyroidism    Hypothyroid   Insomnia    Osteopenia     per DEXA 07-2008 (Rx fosamax)   Rheumatoid arthritis ([place])    Stroke ([place])     " balance issue"   Past Surgical History:  Procedure Laterality Date   COLON SURGERY     Right / Adenocarcinoma / Chemo   COLONOSCOPY  2005, 2008   Dr. Lucia Gaskins   INGUINAL HERNIA REPAIR     Right   IR ANGIO INTRA EXTRACRAN SEL COM CAROTID INNOMINATE BILAT MOD SED  12/13/2017   IR ANGIO VERTEBRAL SEL VERTEBRAL BILAT MOD SED  12/13/2017   IR PTA INTRACRANIAL  12/18/2017   RADIOLOGY WITH ANESTHESIA N/A 12/18/2017   Procedure: Angioplasty with stenting;  Surgeon: Luanne Bras, MD;  Location: [facility];  Service: Radiology;  Laterality: N/A;   Patient Active Problem List   Diagnosis Date Noted   Requires assistance with activities of daily living (ADL) 10/11/2021   Bronchitis 10/11/2021   Trapezium fracture 09/09/2021   CMC arthritis, thumb, degenerative 09/09/2021   Left wrist pain 09/05/2021    Radial nerve palsy, left 09/05/2021   Acute shoulder pain due to trauma, left 07/29/2021   Slow transit constipation    Stroke (cerebrum) ([place]) 07/07/2021   CVA (cerebral vascular accident) [facility]) with intracranial vascular stenosis 06/29/2021   Intracranial vascular stenosis 06/29/2021   Prediabetes 06/29/2021   Normocytic anemia 06/29/2021   History of stroke 06/29/2021   Other drug-induced neutropenia ([place]) 06/09/2021   Malignant neoplasm of ascending colon ([place]) 06/09/2021   Stenosis of artery ([place]) 06/09/2021   Preventative health care 03/01/2021   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   Kidney disease, chronic, stage III (moderate, EGFR 30-59 ml/min) (HCC) 08/07/2020   Leukopenia 08/07/2020   Acute respiratory failure with hypoxia ([place]) 08/07/2020   SIRS (systemic inflammatory response syndrome) ([place]) 08/07/2020   Methotrexate adverse reaction, initial encounter    Acute pain of right shoulder 04/14/2020   Lower extremity edema 11/13/2019   Head trauma 11/13/2019   Dyslipidemia 11/13/2019   Memory loss 07/28/2019   Chronic right shoulder pain 07/28/2019   Frequent falls 07/28/2019   Balance problem 07/28/2019   Dysphagia 07/28/2019   Traumatic hemorrhage of cerebrum, unspecified, without loss of consciousness, initial encounter ([place])    Basilar artery stenosis  with infarction ([place]) 12/18/2017   TIA (transient ischemic attack) 12/10/2017   Rheumatoid arthritis ([place])    Chest pain 06/02/2011   General medical examination 04/11/2011   PEPTIC ULCER DISEASE, HELICOBACTER PYLORI POSITIVE 07/12/2010   PERSONAL HISTORY MALIG NEOPLASM LARGE INTESTINE 05/18/2010   MITRAL VALVE DISORDERS 04/05/2009   HOARSENESS 12/04/2008   OSTEOPENIA 08/26/2008   HYPERTRIGLYCERIDEMIA 07/23/2008   EPISTAXIS 07/26/2007   ERECTILE DYSFUNCTION 02/22/2007   ECZEMA 12/07/2006   LEG CRAMPS 10/09/2006   Hypothyroidism 08/28/2006   Essential hypertension 08/28/2006   HAY FEVER 08/28/2006   INSOMNIA  08/28/2006    ONSET DATE: 07/07/2021  REFERRING DIAG: D74.12  THERAPY DIAG:  Cerebrovascular accident (CVA) due to thrombosis of cerebral artery (HCC)  Other abnormalities of gait and mobility  Muscle weakness (generalized)  Unspecified lack of coordination  Rationale for Evaluation and Treatment Rehabilitation  SUBJECTIVE:                                                                                                                                                                                              SUBJECTIVE STATEMENT: Patient coming to PT following a stroke, states he feels fine but is having trouble being able to walk and do normal activities since. His wife hired a caretaker to help him with ADLs. He states he walks on his own and has not had falls but history shows otherwise. Chart review shows that he is often confused and some days more often than others.   Pt accompanied by: interpreter: Eartha Inch and caretaker who sat in the car  PERTINENT HISTORY: colon surgery, head inury 11/2017, requires assistance w/ADLs, bronchitis 10/11/21, L radial nerve palsy 08/2021, hx of stroke and CVA, stage 3 kidney disease, memory loss, frequent falls (the most recent being 08/27/21 and he hurt his shoulder)  PAIN:  Are you having pain? No  PRECAUTIONS: Fall  WEIGHT BEARING RESTRICTIONS No  FALLS: Has patient fallen in last 6 months? Yes. Number of falls multiple and patient denied that he had falls but chart review says otherwise  SCREENING FOR RED FLAGS: Bowel or bladder incontinence: no Spinal tumors: no Cauda equina syndrome: no Compression fracture: no Abdominal aneurysm: no Fever: no Night Chills: no Unexplained weight loss: no  LIVING ENVIRONMENT: Lives with: lives with their family Lives in: House/apartment Stairs: Yes: Internal: 12 steps; bilateral but cannot reach both Has following equipment at home: Walker - 2 wheeled  PLOF: Needs assistance  with ADLs and Needs assistance with gait  PATIENT GOALS walk with walker no assistance  OBJECTIVE:   DIAGNOSTIC FINDINGS:  EXAM: LEFT WRIST - COMPLETE  3+ VIEW   COMPARISON:  None.   FINDINGS: Questionable nondisplaced fracture in the proximal trapezium. Degenerative changes at the first carpometacarpal joint.   No acute displaced fracture identified of the distal radius or ulna. No focal soft tissue swelling. No radiopaque foreign body.   IMPRESSION: Questionable nondisplaced fracture at the base of the trapezium. Correlation with point tenderness may be useful.     All other imaging negative.   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, historic buildings and History of cognitive impairments - at baseline Hx of intermittent confusion, unable to answer questions with full understanding  SENSATION: WFL  COORDINATION: WFL but requires additional time   POSTURE: rounded shoulders, forward head, increased thoracic kyphosis, and flexed trunk   LOWER EXTREMITY ROM:   no objective measurements taken, but noticeable hip tightness with gait, and IR/ER tightness when tested in wheelchair  Active  Right Eval Left Eval  Hip flexion    Hip extension    Hip abduction    Hip adduction    Hip internal rotation    Hip external rotation    Knee flexion    Knee extension    Ankle dorsiflexion    Ankle plantarflexion    Ankle inversion    Ankle eversion     (Blank rows = not tested)  LOWER EXTREMITY MMT:    MMT Right Eval Left Eval  Hip flexion 4- 4-  Hip extension    Hip abduction 3+ 3+  Hip adduction 4 4  Hip internal rotation    Hip external rotation    Knee flexion 4- 4-  Knee extension 4- 4-  Ankle dorsiflexion 4 4  Ankle plantarflexion 4 4  Ankle inversion    Ankle eversion    (Blank rows = not tested)  UPPER EXTREMITY ROM:     Active  Right Eval Left Eval  Shoulder flexion 125 110  Shoulder extension     Shoulder abduction 73 90  Shoulder adduction    Shoulder IR    Shoulder ER [facility] WFL  Elbow flexion WNL WNL  Elbow extension WNL WNL   (Blank rows = not tested)  UPPER EXTREMITY MMT:     MMT Right Eval Left Eval  Shoulder flexion 3 3  Shoulder extension    Shoulder abduction 2+ 2+  Shoulder adduction    Shoulder IR 3+ 3+  Shoulder ER 3+ 3+  Elbow flexion 4- 4-  Elbow extension 4- 4-  (Blank rows = not tested)  BED MOBILITY:  TBD  TRANSFERS: Assistive device utilized: Environmental consultant - 2 wheeled and Wheelchair (manual)  Sit to stand: Mod A Stand to sit: Mod A Patient has a tendency to push backwards with transfers  GAIT: Gait pattern: step to pattern, decreased step length- Right, decreased step length- Left, decreased stride length, shuffling, ataxic, trendelenburg, decreased trunk rotation, trunk flexed, and wide BOS Distance walked: 51f Assistive device utilized: Walker - 2 wheeled Level of assistance: Mod A Comments: modA to stand from w/c and walk 115f unsteadiness on feet especially with turns, slow gait, wide BOS  FUNCTIONAL TESTs:  5 times sit to stand: TBD (unable to do w/o modA) Timed up and go (TUG): 91m591m30sec w/modA-maxA   TODAY'S TREATMENT:  Gait and strength training, initiate POC Seated LAQ w/2# AW 1x10 Seated shoulder flexion w/1# rod 1x10   PATIENT EDUCATION: Education details: POC  Person educated: Patient and Caregiver Education method: Explanation, Demonstration, [employer] treasureres, and Verbal cues Education comprehension:  returned demonstration, verbal cues required, tactile cues required, and needs further education   HOME EXERCISE PROGRAM: Access Code: [employer]  Exercises - Seated Long Arc Quad  - 2 x daily - 7 x weekly - 3 sets - 10 reps - Seated March  - 2 x daily - 7 x weekly - 3 sets - 10 reps - Seated Shoulder Flexion AAROM with [place]el  - 2 x daily - 7 x weekly - 3 sets - 10 reps    GOALS: Goals reviewed with patient? Yes  SHORT  TERM GOALS: Target date: 11/30/21  Patient will complete HEP Baseline: Goal status: INITIAL    LONG TERM GOALS: Target date: 01/11/22  Patient will demonstrate 4/5 strength in UE to be able to complete ADLs with minA Baseline:  Goal status: INITIAL  2.  Patient will ambulate 48f with minA or better to be able to navigate inside home Baseline:  Goal status: INITIAL  3.  Patient will demonstrate >= 4/5 strength in LE to be able to complete transfers with less assistance and become household ambulator Baseline:  Goal status: INITIAL  4.  Patient will score <133m on TUG using rolling walker and minA Baseline:  Goal status: INITIAL   ASSESSMENT:  CLINICAL IMPRESSION: Patient is a [age].o. male who was seen today for physical therapy evaluation and treatment for recent CVA and frequent falls. He has an extensive history of falls and strokes. Patient requires assistance with ADLs and has a caregiver who helps out at the house. He is unable to complete transfers and locomotion without mod-maxA. Patient demonstrate fair strength in UE and LE and would benefit from skilled PT intervention to address coordination, balance, and strength deficits to be able to return to walking with a walker in his home and become less dependent.    OBJECTIVE IMPAIRMENTS Abnormal gait, decreased activity tolerance, decreased balance, decreased cognition, decreased coordination, decreased knowledge of condition, decreased mobility, difficulty walking, decreased ROM, decreased strength, and decreased safety awareness.   ACTIVITY LIMITATIONS sitting, standing, transfers, bed mobility, bathing, toileting, dressing, and locomotion level  PARTICIPATION LIMITATIONS: cleaning, interpersonal relationship, community activity, and home maintenance  PERSONAL FACTORS Age, Behavior pattern, and 3+ comorbidities: frequent falls, hx of strokes, memory loss  are also affecting patient's functional outcome.   REHAB  POTENTIAL: Fair age, low functional level, and cognitive factors may cause difficulty with treatment  CLINICAL DECISION MAKING: Evolving/moderate complexity  EVALUATION COMPLEXITY: Moderate  PLAN: PT FREQUENCY: 2x/week  PT DURATION: 12 weeks  PLANNED INTERVENTIONS: Therapeutic exercises, Therapeutic activity, Neuromuscular re-education, Balance training, Gait training, Patient/Family education, Joint mobilization, Stair training, Wheelchair mobility training, and Re-evaluation  PLAN FOR NEXT SESSION: Review HEP, initiate seated strengthening exercises, gait training    MoAndris BaumannPT 10/19/2021, 2:44 PM

## 2021-10-21 NOTE — Chronic Care Management (AMB) (Signed)
  Chronic Care Management Note  10/21/2021 Name: [name] MRN: [id number] DOB: 08/22/1928  [name] is a 88 y.o. year old male who is a primary care patient of [name], DO and is actively engaged with the care management team. I reached out to Baron Sane by phone today to assist with re-scheduling a follow up visit with the Pharmacist  Follow up plan: Telephone appointment with care management team member scheduled for: 11/02/2021  Julian Hy, [place], [place] Management  Direct Dial: [phone]

## 2021-10-25 ENCOUNTER — Ambulatory Visit: Payer: Medicare Other | Admitting: Physical Therapy

## 2021-10-26 ENCOUNTER — Ambulatory Visit: Payer: Medicare Other | Attending: Family Medicine

## 2021-10-26 DIAGNOSIS — M6281 Muscle weakness (generalized): Secondary | ICD-10-CM | POA: Diagnosis not present

## 2021-10-26 DIAGNOSIS — R2689 Other abnormalities of gait and mobility: Secondary | ICD-10-CM | POA: Diagnosis not present

## 2021-10-26 DIAGNOSIS — R279 Unspecified lack of coordination: Secondary | ICD-10-CM | POA: Insufficient documentation

## 2021-10-26 DIAGNOSIS — I6330 Cerebral infarction due to thrombosis of unspecified cerebral artery: Secondary | ICD-10-CM | POA: Diagnosis not present

## 2021-10-26 NOTE — Therapy (Signed)
OUTPATIENT PHYSICAL THERAPY TREATMENT   Patient Name: [name] MRN: [id number] DOB:1929/01/19, 88 y.o., male Today's Date: 10/26/2021   PCP: [name] REFERRING PROVIDER: Garnet Koyanagi Chase   PT End of Session - 10/26/21 1058     Visit Number 2    PT Start Time 1025    PT Stop Time 1107    PT Time Calculation (min) 42 min    Equipment Utilized During Treatment Gait belt    Activity Tolerance Patient tolerated treatment well    Behavior During Therapy WFL for tasks assessed/performed              Past Medical History:  Diagnosis Date   Adenocarcinoma ([place]) 1985   colon s/p right hemicolectomy and chemotherapy. F/U with cancer center and Dr. Lucia Gaskins rotinely   Depression    Eczema    Epistaxis    f/u per ENT   Erectile dysfunction    Gastric ulcer 12/11   H-Pylori Tx, EGD 3-12: gastritis   Hay fever    Head injury 11/2017   with fall   Hyperlipemia    Hypertension    Hypothyroidism    Hypothyroid   Insomnia    Osteopenia     per DEXA 07-2008 (Rx fosamax)   Rheumatoid arthritis ([place])    Stroke ([place])     " balance issue"   Past Surgical History:  Procedure Laterality Date   COLON SURGERY     Right / Adenocarcinoma / Chemo   COLONOSCOPY  2005, 2008   Dr. Lucia Gaskins   INGUINAL HERNIA REPAIR     Right   IR ANGIO INTRA EXTRACRAN SEL COM CAROTID INNOMINATE BILAT MOD SED  12/13/2017   IR ANGIO VERTEBRAL SEL VERTEBRAL BILAT MOD SED  12/13/2017   IR PTA INTRACRANIAL  12/18/2017   RADIOLOGY WITH ANESTHESIA N/A 12/18/2017   Procedure: Angioplasty with stenting;  Surgeon: Luanne Bras, MD;  Location: [facility];  Service: Radiology;  Laterality: N/A;   Patient Active Problem List   Diagnosis Date Noted   Requires assistance with activities of daily living (ADL) 10/11/2021   Bronchitis 10/11/2021   Trapezium fracture 09/09/2021   CMC arthritis, thumb, degenerative 09/09/2021   Left wrist pain 09/05/2021   Radial nerve palsy, left 09/05/2021   Acute shoulder  pain due to trauma, left 07/29/2021   Slow transit constipation    Stroke (cerebrum) ([place]) 07/07/2021   CVA (cerebral vascular accident) [facility]) with intracranial vascular stenosis 06/29/2021   Intracranial vascular stenosis 06/29/2021   Prediabetes 06/29/2021   Normocytic anemia 06/29/2021   History of stroke 06/29/2021   Other drug-induced neutropenia ([place]) 06/09/2021   Malignant neoplasm of ascending colon ([place]) 06/09/2021   Stenosis of artery ([place]) 06/09/2021   Preventative health care 03/01/2021   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   Kidney disease, chronic, stage III (moderate, EGFR 30-59 ml/min) (HCC) 08/07/2020   Leukopenia 08/07/2020   Acute respiratory failure with hypoxia ([place]) 08/07/2020   SIRS (systemic inflammatory response syndrome) ([place]) 08/07/2020   Methotrexate adverse reaction, initial encounter    Acute pain of right shoulder 04/14/2020   Lower extremity edema 11/13/2019   Head trauma 11/13/2019   Dyslipidemia 11/13/2019   Memory loss 07/28/2019   Chronic right shoulder pain 07/28/2019   Frequent falls 07/28/2019   Balance problem 07/28/2019   Dysphagia 07/28/2019   Traumatic hemorrhage of cerebrum, unspecified, without loss of consciousness, initial encounter ([place])    Basilar artery stenosis with infarction ([place]) 12/18/2017   TIA (transient ischemic attack)  12/10/2017   Rheumatoid arthritis ([place])    Chest pain 06/02/2011   General medical examination 04/11/2011   PEPTIC ULCER DISEASE, HELICOBACTER PYLORI POSITIVE 07/12/2010   PERSONAL HISTORY MALIG NEOPLASM LARGE INTESTINE 05/18/2010   MITRAL VALVE DISORDERS 04/05/2009   HOARSENESS 12/04/2008   OSTEOPENIA 08/26/2008   HYPERTRIGLYCERIDEMIA 07/23/2008   EPISTAXIS 07/26/2007   ERECTILE DYSFUNCTION 02/22/2007   ECZEMA 12/07/2006   LEG CRAMPS 10/09/2006   Hypothyroidism 08/28/2006   Essential hypertension 08/28/2006   HAY FEVER 08/28/2006   INSOMNIA 08/28/2006    ONSET DATE: 07/07/2021  REFERRING DIAG:  U63.33  THERAPY DIAG:  Cerebrovascular accident (CVA) due to thrombosis of cerebral artery (HCC)  Other abnormalities of gait and mobility  Muscle weakness (generalized)  Unspecified lack of coordination  Rationale for Evaluation and Treatment Rehabilitation  SUBJECTIVE:                                                                                                                                                                                              SUBJECTIVE STATEMENT: Patient arrived late to appt due to moving slow. Patient enters with rolling walker and able to ambulate from door to room. No falls since last visit, and no pain.  ]  Pt accompanied by: interpreter: Letta Pate and daughter  PERTINENT HISTORY: colon surgery, head inury 11/2017, requires assistance w/ADLs, bronchitis 10/11/21, L radial nerve palsy 08/2021, hx of stroke and CVA, stage 3 kidney disease, memory loss, frequent falls (the most recent being 08/27/21 and he hurt his shoulder)  PAIN:  Are you having pain? No  PRECAUTIONS: Fall  WEIGHT BEARING RESTRICTIONS No  FALLS: Has patient fallen in last 6 months? Yes. 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, historic buildings and History of cognitive impairments - at baseline Hx of intermittent confusion, unable to answer questions with full understanding   LOWER EXTREMITY ROM:   no objective measurements taken, but noticeable hip tightness with gait, and IR/ER tightness when tested in  wheelchair  Active  Right Eval Left Eval  Hip flexion    Hip extension    Hip abduction    Hip adduction    Hip internal rotation  Hip external rotation    Knee flexion    Knee extension    Ankle dorsiflexion    Ankle plantarflexion    Ankle inversion    Ankle eversion     (Blank rows = not tested)  LOWER EXTREMITY MMT:    MMT Right Eval Left Eval  Hip flexion 4- 4-  Hip extension    Hip abduction 3+ 3+  Hip adduction 4 4  Hip internal rotation    Hip external rotation    Knee flexion 4- 4-  Knee extension 4- 4-  Ankle dorsiflexion 4 4  Ankle plantarflexion 4 4  Ankle inversion    Ankle eversion    (Blank rows = not tested)  UPPER EXTREMITY ROM:     Active  Right Eval Left Eval  Shoulder flexion 125 110  Shoulder extension    Shoulder abduction 73 90  Shoulder adduction    Shoulder IR    Shoulder ER [facility] WFL  Elbow flexion WNL WNL  Elbow extension WNL WNL   (Blank rows = not tested)  UPPER EXTREMITY MMT:     MMT Right Eval Left Eval  Shoulder flexion 3 3  Shoulder extension    Shoulder abduction 2+ 2+  Shoulder adduction    Shoulder IR 3+ 3+  Shoulder ER 3+ 3+  Elbow flexion 4- 4-  Elbow extension 4- 4-  (Blank rows = not tested)  BED MOBILITY:  TBD  TRANSFERS: Assistive device utilized: Environmental consultant - 2 wheeled and Wheelchair (manual)  Sit to stand: Mod A Stand to sit: Mod A Patient has a tendency to push backwards with transfers  GAIT: Gait pattern: step to pattern, decreased step length- Right, decreased step length- Left, decreased stride length, shuffling, ataxic, trendelenburg, decreased trunk rotation, trunk flexed, and wide BOS Distance walked: 44f Assistive device utilized: Walker - 2 wheeled Level of assistance: Mod A Comments: modA to stand from w/c and walk 138f unsteadiness on feet especially with turns, slow gait, wide BOS  FUNCTIONAL TESTs:  5 times sit to stand: TBD (unable to do w/o modA) Timed up and go (TUG): 58m22m 30sec w/modA-maxA   TODAY'S TREATMENT:  10/26/21 STS 2x10 Seated LAQ 2x10 Standing hip marches 2x10 Standing hip abd 2x10 Seated rows rebTB x12, green x12 ER yellowTB x12 Walking from car to room back     PATIENT EDUCATION: Education details: POC  Person educated: Patient and CarArmed forces training and education officerthod: ExpConsulting civil engineeremMedia planneracCorporate treasureres, and Verbal cues Education comprehension: returned demonstration, verbal cues required, tactile cues required, and needs further education   HOME EXERCISE PROGRAM: Access Code: [email or web address]    GOALS: Goals reviewed with patient? Yes  SHORT TERM GOALS: Target date: 11/30/21  Patient will complete HEP Baseline: Goal status: INITIAL    LONG TERM GOALS: Target date: 01/11/22  Patient will demonstrate 4/5 strength in UE to be able to complete ADLs with minA Baseline:  Goal status: INITIAL  2.  Patient will ambulate 58f71fth minA or better to be able to navigate inside home Baseline:  Goal status: INITIAL  3.  Patient will demonstrate >= 4/5 strength in LE to be able to complete transfers with less assistance and become household ambulator Baseline:  Goal status: INITIAL  4.  Patient will score <58min48m TUG using rolling walker and minA Baseline:  Goal status: INITIAL   ASSESSMENT:  CLINICAL IMPRESSION: Patient returns to PT using rolling walker instead of wheelchair and able to walk with CGA to and from the main entrance  to treatment room. Left foot presents with increased ER with gait which happened after the stroke, pt was never given an AFO. Today's session continued to focus on LE and UE strengthening. He requires minA to initiate STS but able to complete with 1-2 hand push off, and needs cues to lean forward and get feet behind. Daughter stated that he has arthirits in L wrist and a torn rotator cuff on L shoulder and needs verbal cues to try not to bear a ton of weight on L wrist. Patient's daughter accompanied visit and  showed his PLOF while doing rehab at the hospital and would like him to get back to walking and completing transfers with less assistance.    OBJECTIVE IMPAIRMENTS Abnormal gait, decreased activity tolerance, decreased balance, decreased cognition, decreased coordination, decreased knowledge of condition, decreased mobility, difficulty walking, decreased ROM, decreased strength, and decreased safety awareness.   ACTIVITY LIMITATIONS sitting, standing, transfers, bed mobility, bathing, toileting, dressing, and locomotion level  PARTICIPATION LIMITATIONS: cleaning, interpersonal relationship, community activity, and home maintenance  PERSONAL FACTORS Age, Behavior pattern, and 3+ comorbidities: frequent falls, hx of strokes, memory loss  are also affecting patient's functional outcome.   REHAB POTENTIAL: Fair age, low functional level, and cognitive factors may cause difficulty with treatment  CLINICAL DECISION MAKING: Evolving/moderate complexity  EVALUATION COMPLEXITY: Moderate  PLAN: PT FREQUENCY: 2x/week  PT DURATION: 12 weeks  PLANNED INTERVENTIONS: Therapeutic exercises, Therapeutic activity, Neuromuscular re-education, Balance training, Gait training, Patient/Family education, Joint mobilization, Stair training, Wheelchair mobility training, and Re-evaluation  PLAN FOR NEXT SESSION: Review HEP, gait training, LE strengthening, ankle strengthening    Tandrea Kommer, PT 10/26/2021, 11:24 AM

## 2021-10-27 ENCOUNTER — Ambulatory Visit: Payer: Medicare Other | Admitting: Physical Therapy

## 2021-10-27 ENCOUNTER — Encounter: Payer: Self-pay | Admitting: Physical Therapy

## 2021-10-27 DIAGNOSIS — I6330 Cerebral infarction due to thrombosis of unspecified cerebral artery: Secondary | ICD-10-CM | POA: Diagnosis not present

## 2021-10-27 DIAGNOSIS — R279 Unspecified lack of coordination: Secondary | ICD-10-CM

## 2021-10-27 DIAGNOSIS — R2689 Other abnormalities of gait and mobility: Secondary | ICD-10-CM

## 2021-10-27 DIAGNOSIS — M6281 Muscle weakness (generalized): Secondary | ICD-10-CM

## 2021-10-27 NOTE — Therapy (Signed)
OUTPATIENT PHYSICAL THERAPY TREATMENT   Patient Name: [name] MRN: [id number] DOB:March 22, 1929, 88 y.o., male Today's Date: 10/27/2021   PCP: [name] REFERRING PROVIDER: [name]   PT End of Session - 10/27/21 1102     Visit Number 3    Date for PT Re-Evaluation 01/11/22    Authorization Type [employer]    PT Start Time 3785    PT Stop Time 8850    PT Time Calculation (min) 45 min    Equipment Utilized During Treatment Gait belt    Activity Tolerance Patient tolerated treatment well    Behavior During Therapy WFL for tasks assessed/performed              Past Medical History:  Diagnosis Date   Adenocarcinoma ([place]) 1985   colon s/p right hemicolectomy and chemotherapy. F/U with cancer center and Dr. Lucia Gaskins rotinely   Depression    Eczema    Epistaxis    f/u per ENT   Erectile dysfunction    Gastric ulcer 12/11   H-Pylori Tx, EGD 3-12: gastritis   Hay fever    Head injury 11/2017   with fall   Hyperlipemia    Hypertension    Hypothyroidism    Hypothyroid   Insomnia    Osteopenia     per DEXA 07-2008 (Rx fosamax)   Rheumatoid arthritis ([place])    Stroke ([place])     " balance issue"   Past Surgical History:  Procedure Laterality Date   COLON SURGERY     Right / Adenocarcinoma / Chemo   COLONOSCOPY  2005, 2008   Dr. Lucia Gaskins   INGUINAL HERNIA REPAIR     Right   IR ANGIO INTRA EXTRACRAN SEL COM CAROTID INNOMINATE BILAT MOD SED  12/13/2017   IR ANGIO VERTEBRAL SEL VERTEBRAL BILAT MOD SED  12/13/2017   IR PTA INTRACRANIAL  12/18/2017   RADIOLOGY WITH ANESTHESIA N/A 12/18/2017   Procedure: Angioplasty with stenting;  Surgeon: Luanne Bras, MD;  Location: [facility];  Service: Radiology;  Laterality: N/A;   Patient Active Problem List   Diagnosis Date Noted   Requires assistance with activities of daily living (ADL) 10/11/2021   Bronchitis 10/11/2021   Trapezium fracture 09/09/2021   CMC arthritis, thumb, degenerative 09/09/2021   Left wrist  pain 09/05/2021   Radial nerve palsy, left 09/05/2021   Acute shoulder pain due to trauma, left 07/29/2021   Slow transit constipation    Stroke (cerebrum) ([place]) 07/07/2021   CVA (cerebral vascular accident) [facility]) with intracranial vascular stenosis 06/29/2021   Intracranial vascular stenosis 06/29/2021   Prediabetes 06/29/2021   Normocytic anemia 06/29/2021   History of stroke 06/29/2021   Other drug-induced neutropenia ([place]) 06/09/2021   Malignant neoplasm of ascending colon ([place]) 06/09/2021   Stenosis of artery ([place]) 06/09/2021   Preventative health care 03/01/2021   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   Kidney disease, chronic, stage III (moderate, EGFR 30-59 ml/min) ([place]) 08/07/2020   Leukopenia 08/07/2020   Acute respiratory failure with hypoxia ([place]) 08/07/2020   SIRS (systemic inflammatory response syndrome) ([place]) 08/07/2020   Methotrexate adverse reaction, initial encounter    Acute pain of right shoulder 04/14/2020   Lower extremity edema 11/13/2019   Head trauma 11/13/2019   Dyslipidemia 11/13/2019   Memory loss 07/28/2019   Chronic right shoulder pain 07/28/2019   Frequent falls 07/28/2019   Balance problem 07/28/2019   Dysphagia 07/28/2019   Traumatic hemorrhage of cerebrum, unspecified, without loss of consciousness, initial encounter ([place])  Basilar artery stenosis with infarction ([place]) 12/18/2017   TIA (transient ischemic attack) 12/10/2017   Rheumatoid arthritis ([place])    Chest pain 06/02/2011   General medical examination 04/11/2011   PEPTIC ULCER DISEASE, HELICOBACTER PYLORI POSITIVE 07/12/2010   PERSONAL HISTORY MALIG NEOPLASM LARGE INTESTINE 05/18/2010   MITRAL VALVE DISORDERS 04/05/2009   HOARSENESS 12/04/2008   OSTEOPENIA 08/26/2008   HYPERTRIGLYCERIDEMIA 07/23/2008   EPISTAXIS 07/26/2007   ERECTILE DYSFUNCTION 02/22/2007   ECZEMA 12/07/2006   LEG CRAMPS 10/09/2006   Hypothyroidism 08/28/2006   Essential hypertension 08/28/2006   HAY FEVER 08/28/2006    INSOMNIA 08/28/2006    ONSET DATE: 07/07/2021  REFERRING DIAG: R42.70  THERAPY DIAG:  Cerebrovascular accident (CVA) due to thrombosis of cerebral artery ([place])  Other abnormalities of gait and mobility  Muscle weakness (generalized)  Unspecified lack of coordination  Rationale for Evaluation and Treatment Rehabilitation  SUBJECTIVE:                                                                                                                                                                                              SUBJECTIVE STATEMENT: Patient reports that he did okay after the session yesterday, the caregiver reports that he was tired and slept very good  ]  Pt accompanied by: caregiver  PERTINENT HISTORY: colon surgery, head inury 11/2017, requires assistance w/ADLs, bronchitis 10/11/21, L radial nerve palsy 08/2021, hx of stroke and CVA, stage 3 kidney disease, memory loss, frequent falls (the most recent being 08/27/21 and he hurt his shoulder)  PAIN:  Are you having pain? No  PRECAUTIONS: Fall  WEIGHT BEARING RESTRICTIONS No  FALLS: Has patient fallen in last 6 months? Yes. 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, historic buildings and History of cognitive impairments - at baseline Hx of intermittent confusion, unable to answer questions with full understanding   LOWER EXTREMITY ROM:   no objective measurements taken, but noticeable hip tightness with gait, and IR/ER tightness when tested in  wheelchair  Active  Right Eval Left Eval  Hip flexion    Hip extension    Hip abduction    Hip adduction    Hip internal rotation  Hip external rotation    Knee flexion    Knee extension    Ankle dorsiflexion    Ankle plantarflexion    Ankle inversion    Ankle eversion     (Blank rows = not tested)  LOWER EXTREMITY MMT:    MMT Right Eval Left Eval  Hip flexion 4- 4-  Hip extension    Hip abduction 3+ 3+  Hip adduction 4 4  Hip internal rotation    Hip external rotation    Knee flexion 4- 4-  Knee extension 4- 4-  Ankle dorsiflexion 4 4  Ankle plantarflexion 4 4  Ankle inversion    Ankle eversion    (Blank rows = not tested)  UPPER EXTREMITY ROM:     Active  Right Eval Left Eval  Shoulder flexion 125 110  Shoulder extension    Shoulder abduction 73 90  Shoulder adduction    Shoulder IR    Shoulder ER [facility] WFL  Elbow flexion WNL WNL  Elbow extension WNL WNL   (Blank rows = not tested)  UPPER EXTREMITY MMT:     MMT Right Eval Left Eval  Shoulder flexion 3 3  Shoulder extension    Shoulder abduction 2+ 2+  Shoulder adduction    Shoulder IR 3+ 3+  Shoulder ER 3+ 3+  Elbow flexion 4- 4-  Elbow extension 4- 4-  (Blank rows = not tested)  BED MOBILITY:  TBD  TRANSFERS: Assistive device utilized: Environmental consultant - 2 wheeled and Wheelchair (manual)  Sit to stand: Mod A Stand to sit: Mod A Patient has a tendency to push backwards with transfers  GAIT: Gait pattern: step to pattern, decreased step length- Right, decreased step length- Left, decreased stride length, shuffling, ataxic, trendelenburg, decreased trunk rotation, trunk flexed, and wide BOS Distance walked: 57f Assistive device utilized: Walker - 2 wheeled Level of assistance: Mod A Comments: modA to stand from w/c and walk 189f unsteadiness on feet especially with turns, slow gait, wide BOS  FUNCTIONAL TESTs:  5 times sit to stand: TBD (unable to do w/o modA) Timed up and go (TUG): 66m79m 30sec w/modA-maxA   TODAY'S TREATMENT:   10/27/21: Nustep Level 3 x 5 minutes Standing 2# marching and hip abduction 2x10 int he Pbars Standing ball toss with SBA Side stepping and backward walking HHA walking 100' x2 with w/c follow, working on speed and step length    10/26/21 STS 2x10 Seated LAQ 2x10 Standing hip marches 2x10 Standing hip abd 2x10 Seated rows rebTB x12, green x12 ER yellowTB x12 Walking from car to room back     PATIENT EDUCATION: Education details: POC  Person educated: Patient and CarArmed forces training and education officerthod: ExpConsulting civil engineeremMedia planneracCorporate treasureres, and Verbal cues Education comprehension: returned demonstration, verbal cues required, tactile cues required, and needs further education   HOME EXERCISE PROGRAM: Access Code: [email or web address]    GOALS: Goals reviewed with patient? Yes  SHORT TERM GOALS: Target date: 11/30/21  Patient will complete HEP Baseline: Goal status: INITIAL    LONG TERM GOALS: Target date: 01/11/22  Patient will demonstrate 4/5 strength in UE to be able to complete ADLs with minA Baseline:  Goal status: INITIAL  2.  Patient will ambulate 53f75fth minA or better to be able to navigate inside home Baseline:  Goal status: INITIAL  3.  Patient will demonstrate >= 4/5 strength in LE to be able to complete transfers with less assistance and become household ambulator Baseline:  Goal status: INITIAL  4.  Patient will score <85mn on TUG using rolling walker and minA Baseline:  Goal status: INITIAL   ASSESSMENT:  CLINICAL IMPRESSION: I added weights to the leg exercises, we started some balance activities with ball toss and side stepping and then added fast walking with HHA, patient did great, he does tend to push back when going from sitting to standing at times   OBJECTIVE IMPAIRMENTS Abnormal gait, decreased activity tolerance, decreased balance, decreased cognition, decreased coordination, decreased knowledge of  condition, decreased mobility, difficulty walking, decreased ROM, decreased strength, and decreased safety awareness.   ACTIVITY LIMITATIONS sitting, standing, transfers, bed mobility, bathing, toileting, dressing, and locomotion level  PARTICIPATION LIMITATIONS: cleaning, interpersonal relationship, community activity, and home maintenance  PERSONAL FACTORS Age, Behavior pattern, and 3+ comorbidities: frequent falls, hx of strokes, memory loss  are also affecting patient's functional outcome.   REHAB POTENTIAL: Fair age, low functional level, and cognitive factors may cause difficulty with treatment  CLINICAL DECISION MAKING: Evolving/moderate complexity  EVALUATION COMPLEXITY: Moderate  PLAN: PT FREQUENCY: 2x/week  PT DURATION: 12 weeks  PLANNED INTERVENTIONS: Therapeutic exercises, Therapeutic activity, Neuromuscular re-education, Balance training, Gait training, Patient/Family education, Joint mobilization, Stair training, Wheelchair mobility training, and Re-evaluation  PLAN FOR NEXT SESSION: See how he did after today's session and add as needed and tolerated  , W, PT 10/27/2021, 12:00 PM

## 2021-10-29 ENCOUNTER — Other Ambulatory Visit (HOSPITAL_BASED_OUTPATIENT_CLINIC_OR_DEPARTMENT_OTHER): Payer: Self-pay | Admitting: Cardiovascular Disease

## 2021-10-31 NOTE — Therapy (Signed)
OUTPATIENT PHYSICAL THERAPY TREATMENT   Patient Name: [name] MRN: [id number] DOB:02/27/1929, 88 y.o., male Today's Date: 11/01/2021   PCP: [name] REFERRING PROVIDER: Garnet Koyanagi Chase   PT End of Session - 11/01/21 1108     Visit Number 4    Date for PT Re-Evaluation 01/11/22    PT Start Time 1102    PT Stop Time 1140    PT Time Calculation (min) 38 min    Equipment Utilized During Treatment Gait belt    Activity Tolerance Patient tolerated treatment well    Behavior During Therapy WFL for tasks assessed/performed               Past Medical History:  Diagnosis Date   Adenocarcinoma ([place]) 1985   colon s/p right hemicolectomy and chemotherapy. F/U with cancer center and Dr. Lucia Gaskins rotinely   Depression    Eczema    Epistaxis    f/u per ENT   Erectile dysfunction    Gastric ulcer 12/11   H-Pylori Tx, EGD 3-12: gastritis   Hay fever    Head injury 11/2017   with fall   Hyperlipemia    Hypertension    Hypothyroidism    Hypothyroid   Insomnia    Osteopenia     per DEXA 07-2008 (Rx fosamax)   Rheumatoid arthritis ([place])    Stroke ([place])     " balance issue"   Past Surgical History:  Procedure Laterality Date   COLON SURGERY     Right / Adenocarcinoma / Chemo   COLONOSCOPY  2005, 2008   Dr. Lucia Gaskins   INGUINAL HERNIA REPAIR     Right   IR ANGIO INTRA EXTRACRAN SEL COM CAROTID INNOMINATE BILAT MOD SED  12/13/2017   IR ANGIO VERTEBRAL SEL VERTEBRAL BILAT MOD SED  12/13/2017   IR PTA INTRACRANIAL  12/18/2017   RADIOLOGY WITH ANESTHESIA N/A 12/18/2017   Procedure: Angioplasty with stenting;  Surgeon: Luanne Bras, MD;  Location: [place];  Service: Radiology;  Laterality: N/A;   Patient Active Problem List   Diagnosis Date Noted   Requires assistance with activities of daily living (ADL) 10/11/2021   Bronchitis 10/11/2021   Trapezium fracture 09/09/2021   CMC arthritis, thumb, degenerative 09/09/2021   Left wrist pain 09/05/2021   Radial  nerve palsy, left 09/05/2021   Acute shoulder pain due to trauma, left 07/29/2021   Slow transit constipation    Stroke (cerebrum) ([place]) 07/07/2021   CVA (cerebral vascular accident) [facility]) with intracranial vascular stenosis 06/29/2021   Intracranial vascular stenosis 06/29/2021   Prediabetes 06/29/2021   Normocytic anemia 06/29/2021   History of stroke 06/29/2021   Other drug-induced neutropenia ([place]) 06/09/2021   Malignant neoplasm of ascending colon ([place]) 06/09/2021   Stenosis of artery ([place]) 06/09/2021   Preventative health care 03/01/2021   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   Kidney disease, chronic, stage III (moderate, EGFR 30-59 ml/min) ([facility]) 08/07/2020   Leukopenia 08/07/2020   Acute respiratory failure with hypoxia ([place]) 08/07/2020   SIRS (systemic inflammatory response syndrome) ([employer]) 08/07/2020   Methotrexate adverse reaction, initial encounter    Acute pain of right shoulder 04/14/2020   Lower extremity edema 11/13/2019   Head trauma 11/13/2019   Dyslipidemia 11/13/2019   Memory loss 07/28/2019   Chronic right shoulder pain 07/28/2019   Frequent falls 07/28/2019   Balance problem 07/28/2019   Dysphagia 07/28/2019   Traumatic hemorrhage of cerebrum, unspecified, without loss of consciousness, initial encounter ([place])    Basilar artery stenosis with  infarction ([place]) 12/18/2017   TIA (transient ischemic attack) 12/10/2017   Rheumatoid arthritis ([place])    Chest pain 06/02/2011   General medical examination 04/11/2011   PEPTIC ULCER DISEASE, HELICOBACTER PYLORI POSITIVE 07/12/2010   PERSONAL HISTORY MALIG NEOPLASM LARGE INTESTINE 05/18/2010   MITRAL VALVE DISORDERS 04/05/2009   HOARSENESS 12/04/2008   OSTEOPENIA 08/26/2008   HYPERTRIGLYCERIDEMIA 07/23/2008   EPISTAXIS 07/26/2007   ERECTILE DYSFUNCTION 02/22/2007   ECZEMA 12/07/2006   LEG CRAMPS 10/09/2006   Hypothyroidism 08/28/2006   Essential hypertension 08/28/2006   HAY FEVER 08/28/2006   INSOMNIA 08/28/2006     ONSET DATE: 07/07/2021  REFERRING DIAG: K93.26  THERAPY DIAG:  Cerebrovascular accident (CVA) due to thrombosis of cerebral artery ([facility])  Other abnormalities of gait and mobility  Muscle weakness (generalized)  Unspecified lack of coordination  Rationale for Evaluation and Treatment Rehabilitation  SUBJECTIVE:                                                                                                                                                                                              SUBJECTIVE STATEMENT: Patient reports no falls since the last time and no pain. He is walking more around the house with rolling walker.  ]  Pt accompanied by: caregiver  PERTINENT HISTORY: colon surgery, head inury 11/2017, requires assistance w/ADLs, bronchitis 10/11/21, L radial nerve palsy 08/2021, hx of stroke and CVA, stage 3 kidney disease, memory loss, frequent falls (the most recent being 08/27/21 and he hurt his shoulder)  PAIN:  Are you having pain? No  PRECAUTIONS: Fall  WEIGHT BEARING RESTRICTIONS No  FALLS: Has patient fallen in last 6 months? Yes. 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, historic buildings and History of cognitive impairments - at baseline Hx of intermittent confusion, unable to answer questions with full understanding   LOWER EXTREMITY ROM:   no objective measurements taken, but noticeable hip tightness with gait, and IR/ER tightness when tested in wheelchair  Active  Right Eval  Left Eval  Hip flexion    Hip extension    Hip abduction    Hip adduction    Hip internal rotation    Hip external rotation  Knee flexion    Knee extension    Ankle dorsiflexion    Ankle plantarflexion    Ankle inversion    Ankle eversion     (Blank rows = not tested)  LOWER EXTREMITY MMT:    MMT Right Eval Left Eval  Hip flexion 4- 4-  Hip extension    Hip abduction 3+ 3+  Hip adduction 4 4  Hip internal rotation    Hip external rotation    Knee flexion 4- 4-  Knee extension 4- 4-  Ankle dorsiflexion 4 4  Ankle plantarflexion 4 4  Ankle inversion    Ankle eversion    (Blank rows = not tested)  UPPER EXTREMITY ROM:     Active  Right Eval Left Eval  Shoulder flexion 125 110  Shoulder extension    Shoulder abduction 73 90  Shoulder adduction    Shoulder IR    Shoulder ER [facility] WFL  Elbow flexion WNL WNL  Elbow extension WNL WNL   (Blank rows = not tested)  UPPER EXTREMITY MMT:     MMT Right Eval Left Eval  Shoulder flexion 3 3  Shoulder extension    Shoulder abduction 2+ 2+  Shoulder adduction    Shoulder IR 3+ 3+  Shoulder ER 3+ 3+  Elbow flexion 4- 4-  Elbow extension 4- 4-  (Blank rows = not tested)  BED MOBILITY:  TBD  TRANSFERS: Assistive device utilized: Environmental consultant - 2 wheeled and Wheelchair (manual)  Sit to stand: Mod A Stand to sit: Mod A Patient has a tendency to push backwards with transfers  GAIT: Gait pattern: step to pattern, decreased step length- Right, decreased step length- Left, decreased stride length, shuffling, ataxic, trendelenburg, decreased trunk rotation, trunk flexed, and wide BOS Distance walked: 1f Assistive device utilized: Walker - 2 wheeled Level of assistance: Mod A Comments: modA to stand from w/c and walk 144f unsteadiness on feet especially with turns, slow gait, wide BOS  FUNCTIONAL TESTs:  5 times sit to stand: TBD (unable to do w/o modA) Timed up and go (TUG): 12m512m30sec w/modA-maxA   TODAY'S  TREATMENT:  11/01/21 Standing marches 2#  Standing abduction 2# Side stepping in parallel bars Walking with one HHA in parallel bars Walking w/quad cane [id number] Standing shoulder flexion with red ball 2x10  10/27/21: Nustep Level 3 x 5 minutes Standing 2# marching and hip abduction 2x10 int he Pbars Standing ball toss with SBA Side stepping and backward walking HHA walking 100' x2 with w/c follow, working on speed and step length     PATIENT EDUCATION: Education details: POC  Person educated: Patient and CareArmed forces training and education officerhod: ExplConsulting civil engineermoMedia plannerctCorporate treasurers, and Verbal cues Education comprehension: returned demonstration, verbal cues required, tactile cues required, and needs further education   HOME EXERCISE PROGRAM: Access Code: [email or web address]    GOALS: Goals reviewed with patient? Yes  SHORT TERM GOALS: Target date: 11/30/21  Patient will complete HEP Baseline: Goal status: INITIAL    LONG TERM GOALS: Target date: 01/11/22  Patient will demonstrate 4/5 strength in UE to be able to complete ADLs with minA Baseline:  Goal status: INITIAL  2.  Patient will ambulate 59ft9fh minA or better to be able to navigate inside home Baseline:  Goal status: INITIAL  3.  Patient will demonstrate >= 4/5 strength in LE to be able to complete transfers with less assistance and become household ambulator Baseline:  Goal status: INITIAL  4.  Patient will score <12min 65mTUG  using rolling walker and minA Baseline:  Goal status: INITIAL   ASSESSMENT:  CLINICAL IMPRESSION: Patient is showing good improvements with LE strength and gait training. Continued with 2# weight standing exercises. Added side stepping and forwards walking in parallel bars holding on with 1HHA. Then tried walking with quad cane, patient did well and requires cues for big steps and trying to keep feet pointed forwards instead of turned out.    OBJECTIVE IMPAIRMENTS Abnormal gait,  decreased activity tolerance, decreased balance, decreased cognition, decreased coordination, decreased knowledge of condition, decreased mobility, difficulty walking, decreased ROM, decreased strength, and decreased safety awareness.   ACTIVITY LIMITATIONS sitting, standing, transfers, bed mobility, bathing, toileting, dressing, and locomotion level  PARTICIPATION LIMITATIONS: cleaning, interpersonal relationship, community activity, and home maintenance  PERSONAL FACTORS Age, Behavior pattern, and 3+ comorbidities: frequent falls, hx of strokes, memory loss  are also affecting patient's functional outcome.   REHAB POTENTIAL: Fair age, low functional level, and cognitive factors may cause difficulty with treatment  CLINICAL DECISION MAKING: Evolving/moderate complexity  EVALUATION COMPLEXITY: Moderate  PLAN: PT FREQUENCY: 2x/week  PT DURATION: 12 weeks  PLANNED INTERVENTIONS: Therapeutic exercises, Therapeutic activity, Neuromuscular re-education, Balance training, Gait training, Patient/Family education, Joint mobilization, Stair training, Wheelchair mobility training, and Re-evaluation  PLAN FOR NEXT SESSION: continue with gait training and LE strengthening   Andris Baumann, PT 11/01/2021, 11:43 AM

## 2021-11-01 ENCOUNTER — Ambulatory Visit: Payer: Medicare Other

## 2021-11-01 DIAGNOSIS — R279 Unspecified lack of coordination: Secondary | ICD-10-CM

## 2021-11-01 DIAGNOSIS — R2689 Other abnormalities of gait and mobility: Secondary | ICD-10-CM | POA: Diagnosis not present

## 2021-11-01 DIAGNOSIS — I6330 Cerebral infarction due to thrombosis of unspecified cerebral artery: Secondary | ICD-10-CM

## 2021-11-01 DIAGNOSIS — M6281 Muscle weakness (generalized): Secondary | ICD-10-CM | POA: Diagnosis not present

## 2021-11-02 ENCOUNTER — Ambulatory Visit (INDEPENDENT_AMBULATORY_CARE_PROVIDER_SITE_OTHER): Payer: Medicare Other | Admitting: Pharmacist

## 2021-11-02 DIAGNOSIS — E039 Hypothyroidism, unspecified: Secondary | ICD-10-CM

## 2021-11-02 DIAGNOSIS — Z8673 Personal history of transient ischemic attack (TIA), and cerebral infarction without residual deficits: Secondary | ICD-10-CM

## 2021-11-02 DIAGNOSIS — R2689 Other abnormalities of gait and mobility: Secondary | ICD-10-CM

## 2021-11-02 DIAGNOSIS — I10 Essential (primary) hypertension: Secondary | ICD-10-CM

## 2021-11-02 DIAGNOSIS — R413 Other amnesia: Secondary | ICD-10-CM

## 2021-11-02 DIAGNOSIS — R531 Weakness: Secondary | ICD-10-CM

## 2021-11-02 DIAGNOSIS — E785 Hyperlipidemia, unspecified: Secondary | ICD-10-CM

## 2021-11-02 NOTE — Chronic Care Management (AMB) (Signed)
Chronic Care Management Pharmacy Note  11/02/2021 Name:  [name] MRN:  [id number] DOB:  08-Feb-1929  Summary: Recent CVA - noted by cardiology that last dose of aspirin should be 11/03/2021. Continue clopidogrel 67m weekly. Patient has not started OT yet. Looks like in home OT was denied since he has been able to attend outpatient PT. Will see if Dr LEtter [facility]rencan [facility]  Provided phone numbers for Dr SLeonie Man neurologist and CSpauldingfor heart monitor placement. Patient missed appointment with both Dr SLeonie Manand heart monitor.  Daughter reports that her mother has been very negative about her father's health recently. States that mother keeps saying "he is no god to me anymore" and awaking him in early morning. Daughter is asking about respite care for her father that would actually give her father a break from her mother. He does have memory issues. Sent referral for our social worker, Daughter also states that in home caregivers are concerned about patient's swallowing and worry that he might choke. She is requesting swallowing study Made appointment 11/08/2021 at 2:20pm with Dr LEtter SjogrenCheri Rousto discuss difficulty swallowing and if needed order swallowing study.  Updated prescription for Myrbetriq sent to pharmacy.   Subjective: [name] an 88y.o. year old male who is a primary patient of [name] DO.  The CCM team was consulted for assistance with disease management and care coordination needs.    Engaged with patient's daughter by telephone for follow up visit in response to provider referral for pharmacy case management and/or care coordination services.   Consent to Services:  The patient was given information about Chronic Care Management services, agreed to services, and gave verbal consent prior to initiation of services.  Please see initial visit note for detailed documentation.   Patient Care Team: [name] YAlferd Apa DO as  PCP - General (Family Medicine) ECherre Robins RPH-CPP (Pharmacist) SGarvin Fila MD as Consulting Physician (Neurology)  Recent office visits: 10/11/2021 - Fam Med (Dr [name] Seen for cough / bronchitis. Prescribed azithromycin. Referred for home health OT / PT; Ordered chest x-ray. 03/01/2021 - PCP (Dr LEtter Sjogren Preventative Health Visit. Changed losartan HCTZ to just losartan 593mdaily due to fatigue, low BP and incresaed urination. Low adhernece to thyroid replacement therapy. TSH was 13.57. Restarted levothyroxine 10053mdaily   09/07/2020 - PCP (Dr LowEtter Sjogrencute visit for pansinusitis - prescribed doxycycline 100m33mice a day for 10 days and fluticsone nasal spray   Recent consult visits: 10/03/2021 - Cardio (Dr [facility]Lucretia Fielden [facility] due to stenosis of right middle cerebral artery. Recommended 1 more month of ASA 81mg77mrapy, then clopidogrel monotherpay indefinitely. Ordered 30 day heart monitor. ood pressure is elevated today.  However given his intracranial stenosis and frequent falls, will not be more aggressive.  Continue doxazosin.  When he sees neurology recommend getting a formal recommendation for his blood pressure goal.   Objective:  Lab Results  Component Value Date   CREATININE 1.45 09/05/2021   CREATININE 1.62 (H) 08/27/2021   CREATININE 1.78 (H) 07/25/2021    Lab Results  Component Value Date   HGBA1C 5.8 (H) 06/29/2021   Last diabetic Eye exam: No results found for: "HMDIABEYEEXA"  Last diabetic Foot exam: No results found for: "HMDIABFOOTEX"      Component Value Date/Time   CHOL 128 09/05/2021 1152   TRIG 375.0 (H) 09/05/2021 1152   HDL 36.50 (L) 09/05/2021 1152  CHOLHDL 4 09/05/2021 1152   VLDL 75.0 (H) 09/05/2021 1152   LDLCALC 102 (H) 06/29/2021 2154   LDLDIRECT 51.0 09/05/2021 1152       Latest Ref Rng & Units 09/05/2021   11:52 AM 07/08/2021    5:53 AM 06/29/2021   12:51 AM  Hepatic Function  Total Protein 6.0 - 8.3 g/dL 7.0  7.0  7.0    Albumin 3.5 - 5.2 g/dL 3.9  3.1  3.2   AST 0 - 37 U/L 27  19  20   ALT 0 - 53 U/L 36  15  11   Alk Phosphatase 39 - 117 U/L 74  62  64   Total Bilirubin 0.2 - 1.2 mg/dL 0.2  0.3  0.2     Lab Results  Component Value Date/Time   TSH 1.30 09/05/2021 11:52 AM   TSH 1.835 06/29/2021 12:51 AM   TSH 1.31 06/09/2021 11:38 AM       Latest Ref Rng & Units 08/27/2021    9:12 AM 07/25/2021    5:11 AM 07/18/2021    7:15 AM  CBC  WBC 4.0 - 10.5 K/uL 11.6  8.1  9.6   Hemoglobin 13.0 - 17.0 g/dL 13.6  11.6  12.0   Hematocrit 39.0 - 52.0 % 42.4  36.4  36.6   Platelets 150 - 400 K/uL 206  206  234     Lab Results  Component Value Date/Time   VD25OH 49.40 04/13/2020 02:11 PM   VD25OH 43 11/15/2009 09:25 PM    Clinical ASCVD: Yes  The ASCVD Risk score (Arnett DK, et al., 2019) failed to calculate for the following reasons:   The 2019 ASCVD risk score is only valid for ages 40 to 79   The patient has a prior MI or stroke diagnosis     Social History   Tobacco Use  Smoking Status Never  Smokeless Tobacco Never   BP Readings from Last 3 Encounters:  10/11/21 118/70  10/03/21 (!) 144/60  09/05/21 140/74   Pulse Readings from Last 3 Encounters:  10/11/21 68  10/03/21 71  09/05/21 68   Wt Readings from Last 3 Encounters:  10/03/21 126 lb 9.6 oz (57.4 kg)  09/09/21 125 lb (56.7 kg)  08/27/21 125 lb (56.7 kg)    Assessment: Review of patient past medical history, allergies, medications, health status, including review of consultants reports, laboratory and other test data, was performed as part of comprehensive evaluation and provision of chronic care management services.   SDOH:  (Social Determinants of Health) assessments and interventions performed:  SDOH Interventions    Flowsheet Row Most Recent Value  SDOH Interventions   Physical Activity Interventions Other (Comments)  [physical therapy]        CCM Care Plan  Allergies  Allergen Reactions   Methotrexate  Derivatives Other (See Comments)    Mouth sores / pancytopenia   Asa [Aspirin] Other (See Comments)    Gastric symptoms - can tolerate 81 mg aspirin   Penicillin G Rash    Did it involve swelling of the face/tongue/throat, SOB, or low BP? Unknown Did it involve sudden or severe rash/hives, skin peeling, or any reaction on the inside of your mouth or nose? Unknown Did you need to seek medical attention at a hospital or doctor's office? Unknown When did it last happen?   unknown    If all above answers are "NO", may proceed with cephalosporin use.    Medications Reviewed Today       Reviewed by Cherre Robins, RPH-CPP (Pharmacist) on 11/02/21 at 1544  Med List Status: <None>   Medication Order Taking? Sig Documenting Provider Last Dose Status Informant  acetaminophen (TYLENOL) 500 MG tablet [id number] Yes Take 1 tablet (500 mg total) by mouth at bedtime. Flora Lipps Taking Active   aspirin EC 325 MG tablet [id number] Yes TAKE 1 TABLET BY MOUTH EVERY DAY Skeet Latch, MD Taking Active   atorvastatin (LIPITOR) 40 MG tablet [id number] Yes Take 1 tablet (40 mg total) by mouth daily. [name] R, DO Taking Active   capsicum (ZOSTRIX) 0.075 % topical cream [id number] Yes Apply 1 application. topically 4 (four) times daily -  with meals and at bedtime. Be sure to wear glove and do not touch eye, mouth or nose (any mucous membranes) Apply to left shoulder. Bary Leriche, PA-C Taking Active   clopidogrel (PLAVIX) 75 MG tablet [id number] Yes Take 1 tablet (75 mg total) by mouth daily. Please call office at [phone] to schedule appt with Dr. Leonie Man for continued refills Garvin Fila, MD Taking Active   docusate sodium (COLACE) 100 MG capsule [id number] Yes Take 2 capsules (200 mg total) by mouth daily. Bary Leriche, PA-C Taking Active   doxazosin (CARDURA) 1 MG tablet [id number]  Take 1 tablet (1 mg total) by mouth at bedtime. Skeet Latch, MD  Active   levothyroxine  (SYNTHROID) 100 MCG tablet [id number] Yes Take 1 tablet (100 mcg total) by mouth daily before breakfast. Carollee Herter, Alferd Apa, DO Taking Active Child  mirabegron ER (MYRBETRIQ) 25 MG TB24 tablet [id number] Yes Take 25 mg by mouth at bedtime. [provider] Taking Active Child  Multiple Vitamins-Minerals (MULTIVITAMIN ADULT) CHEW [id number] Yes Chew 2 each by mouth daily. [provider] Taking Active Child  NONFORMULARY OR COMPOUNDED ITEM [id number] Yes 4 wheeled walker   #1  dx frequent falls, weakness [name], DO Taking Active Child  Med List Note Lannette Donath, CPhT 06/29/21 0206): Resident is independent [facility]            Patient Active Problem List   Diagnosis Date Noted   Requires assistance with activities of daily living (ADL) 10/11/2021   Bronchitis 10/11/2021   Trapezium fracture 09/09/2021   CMC arthritis, thumb, degenerative 09/09/2021   Left wrist pain 09/05/2021   Radial nerve palsy, left 09/05/2021   Acute shoulder pain due to trauma, left 07/29/2021   Slow transit constipation    Stroke (cerebrum) ([place]) 07/07/2021   CVA (cerebral vascular accident) [facility]) with intracranial vascular stenosis 06/29/2021   Intracranial vascular stenosis 06/29/2021   Prediabetes 06/29/2021   Normocytic anemia 06/29/2021   History of stroke 06/29/2021   Other drug-induced neutropenia ([place]) 06/09/2021   Malignant neoplasm of ascending colon ([place]) 06/09/2021   Stenosis of artery ([place]) 06/09/2021   Preventative health care 03/01/2021   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   Kidney disease, chronic, stage III (moderate, EGFR 30-59 ml/min) ([place]) 08/07/2020   Leukopenia 08/07/2020   Acute respiratory failure with hypoxia ([place]) 08/07/2020   SIRS (systemic inflammatory response syndrome) ([place]) 08/07/2020   Methotrexate adverse reaction, initial encounter    Acute pain of right shoulder 04/14/2020   Lower extremity edema 11/13/2019   Head trauma  11/13/2019   Dyslipidemia 11/13/2019   Memory loss 07/28/2019   Chronic right shoulder pain 07/28/2019   Frequent falls 07/28/2019   Balance problem 07/28/2019   Dysphagia 07/28/2019   Traumatic hemorrhage of cerebrum, unspecified, without  loss of consciousness, initial encounter ([place])    Basilar artery stenosis with infarction ([place]) 12/18/2017   TIA (transient ischemic attack) 12/10/2017   Rheumatoid arthritis ([place])    Chest pain 06/02/2011   General medical examination 04/11/2011   PEPTIC ULCER DISEASE, HELICOBACTER PYLORI POSITIVE 07/12/2010   PERSONAL HISTORY MALIG NEOPLASM LARGE INTESTINE 05/18/2010   MITRAL VALVE DISORDERS 04/05/2009   HOARSENESS 12/04/2008   OSTEOPENIA 08/26/2008   HYPERTRIGLYCERIDEMIA 07/23/2008   EPISTAXIS 07/26/2007   ERECTILE DYSFUNCTION 02/22/2007   ECZEMA 12/07/2006   LEG CRAMPS 10/09/2006   Hypothyroidism 08/28/2006   Essential hypertension 08/28/2006   HAY FEVER 08/28/2006   INSOMNIA 08/28/2006    Immunization History  Administered Date(s) Administered   Fluad Quad(high Dose 65+) 04/13/2020   Influenza Split 02/21/2011, 02/27/2012, 02/20/2014   Influenza Whole 04/03/2007, 02/26/2008, 02/15/2009, 02/01/2010   Influenza, High Dose Seasonal PF 01/28/2016, 02/13/2019   Influenza,inj,Quad PF,6+ Mos 02/20/2014   PFIZER Comirnaty(Gray Top)Covid-19 Tri-Sucrose Vaccine 08/24/2020   PFIZER(Purple Top)SARS-COV-2 Vaccination 05/31/2019, 06/21/2019, 02/20/2020   Pfizer Covid-19 Vaccine Bivalent Booster 57yr & up 05/05/2021   Pneumococcal Conjugate-13 07/23/2008, 02/11/2015   Pneumococcal Polysaccharide-23 07/23/2008   Td 11/15/2009   Td,absorbed, Preservative Free, Adult Use, Lf Unspecified 11/15/2009   Tdap 03/09/2019    Conditions to be addressed/monitored: CAD, HTN, HLD, Hypertriglyceridemia, CKD Stage 3, Hypothyroidism, and Osteopenia  Care Plan : General Pharmacy (Adult)  Updates made by ECherre Robins RPH-CPP since 11/02/2021 12:00 AM      Problem: HTN; mixed hyperlipidemia; history of TIA; edema; hypothyroidism; memory loss; balance issues; osteopenia; anemia; RA; dyspepsia; colon CA   Priority: High  Onset Date: 03/07/2021     Long-Range Goal: Pharmacy Care Plan for Chronic Care Management   Start Date: 03/07/2021  Expected End Date: 08/24/2021  This Visit's Progress: On track  Priority: High  Note:   Current Barriers:  Unable to achieve control of hyperlipidemia and hypothyroidism  Unable to self administer medications as prescribed Does not adhere to prescribed medication regimen  Pharmacist Clinical Goal(s):  Over the next 90 days, patient will achieve adherence to monitoring guidelines and medication adherence to achieve therapeutic efficacy achieve control of hypothyroidism and hyperlipidemia as evidenced by TSH in therapeutic range and LDL <70 achieve ability to self administer medications as prescribed through use of weekly medication container with reminders as evidenced by patient report through collaboration with PharmD and provider.   Interventions: 1:1 collaboration with [name] YAlferd Apa DO regarding development and update of comprehensive plan of care as evidenced by provider attestation and co-signature Inter-disciplinary care team collaboration (see longitudinal plan of care) Comprehensive medication review performed; medication list updated in electronic medical record  Hypertension / BPH: Controlled; blood pressure goal <130/80 Current treatment: Doxazosin 113mdaily at beditme Denies hypotensive/hypertensive symptoms Interventions:  Discussed blood pressure goals Discussed limiting intake of sodium for blood pressure and edema  Hyperlipidemia, mixed / Recent CVA: LDL not at goal but Tg at goal; LDL goal <70 and Triglyceride goal <150 CVA 07/07/2021 and TIA noted 11/2017 Current treatment:  Aspirin 32550maily through 11/03/2021 Clopidogrel 26m60mily (indefinitely) Atorvastatin 40mg82mily  Took tricagrelor in past after TIA Interventions: Discussed lipid goals Reminded due to stop aspirin therapy tomorrow per Dr RandoBlenda Mounts note. Continue to take clopidogrel 26mg 24my  Provided number for Dr Sethi Leonie Manrologist to reschedule follow up [facility] for [facility] heart monitor per Dr RandolOval [phone] statin in future  if LDL still >70 (taking into consideration patient's age and memory changes). Might be able to stop gemfibrozil.   Hypothyroidism:  Controlled; Goal: TSH in therapeutic range Current treatment:  Levothyroxine 100mcg take 1 tablet each morning Interventions: Discussed symptoms of low thyroid Discussed importance of taking levothyroxine daily   Osteopenia with low vitamin D:  Last DEXA 2012 showed T-Score of -1.8 at neck of right femur Noted to have frequent falls due to balance but no fractures Current therapy:  Multivitamin daily  Previous therapies: alendronate, vitamin D supplement, calcium supplement (stopped due to constipation) Has history of low vitamin D but last serum vitamin D was 49.4 (04/13/2020) Interventions: (addressed at previous visit)  Consider rechecking serum vitamin D with next labs Could consider rechecking DEXA if would change treatment plan  Fall prevention   SDOH:  Daughter is requesting physical therapy and occupational therapy for Mr. Guerrieri. It looks like patient has been getting physical therapy. However daughter requested in home occupational therapy. There was a home health referral which was denied because patient is able to go to outpatient physical therapy. Explained to patient that might be able to set up OT along with PT at [facility]. Daughter reports that her mother has been very negative about her father's health recently. States that mother keeps saying "he is no god to me anymore" and awaking him in early morning.  Daughter is asking about respite care for her father that would actually give her father a break from her mother. He does have memory issues. Daughter also states that in home caregivers are concerned about patient's swallowing and worry that he might choke. She is requesting swallowing study Interventions:  Referral placed for Chronic Care Management social worker to help with respite care.  Made appointment with PCP for eventuation of dysphagia and referral for swallowing study.   Medication management Pharmacist Clinical Goal(s): Over the next 90 days, patient will work with PharmD and providers to maintain optimal medication adherence [facility] Comprehensive medication review performed. Updated medication list  Reviewed refill history and assessed adherence Continue current medication management strategy Patient self care activities - Over the next 90 days, patient will: Focus on medication adherence by filling medications appropriately  Purchase weekly pill container with alarms / reminders Take medications as prescribed Report any questions or concerns to PharmD and/or provider(s)   Patient Goals/Self-Care Activities Over the next 90 days, patient will:  Take medications as prescribed, Focus on medication adherence by using weekly pill container with alarms Reschedule appointment with Dr Sethi and with [facility] for placement of heart monitor.  Don't forget - due to stop aspirin therapy on 11/03/2021 - continue to take clopidogrel for stroke prevention.  Contact provider or clinical pharmacist with medication questions.  You have an appointment 11/08/2021 at 2:20pm with Dr Lowne- Chase to discuss difficulty swallowing I have sent referral for our social worker, Joanna Saparito. She can provide more information on adult daycare options / respite care.   Follow Up Plan: Telephone follow up appointment with care management team member scheduled for:   1 to 2 months.          Medication Assistance: None required.  Patient affirms current coverage meets needs.  Patient's preferred pharmacy is:  [facility] - [place], [place] - [address] [place] [address] Phone: [phone] Fax: [phone]  [facility] [address]   [place] [address] Phone: [phone] Fax: [phone]  [id number] [address]: [phone]: [phone] [id number] [place]: [phone]: [phone] Uses pill box? Yes Daughter endorses 85 to 90% compliance but she cannot be sure  Follow Up:  Patient agrees to Care Plan and Follow-up.  Plan: Telephone follow up appointment with care management team member scheduled for:  2 months  TaCherre RobinsPharmD Clinical Pharmacist [facility]

## 2021-11-02 NOTE — Patient Instructions (Signed)
[name] It was a pleasure speaking with you  Below is a summary of your health goals and care plan  Patient Goals/Self-Care Activities Take medications as prescribed, Focus on medication adherence by using weekly pill container with alarms Reschedule appointment with Dr [name] and with [facility] for placement of heart monitor.  Don't forget - due to stop aspirin therapy on 11/03/2021 - continue to take clopidogrel for stroke prevention.  Contact provider or clinical pharmacist with medication questions.  You have an appointment 11/08/2021 at 2:20pm with Dr [name] to discuss difficulty swallowing I have sent referral for our social worker, [name]. She can provide more information on adult daycare options / respite care.   Follow Up Plan: Telephone follow up appointment with care management team member scheduled for:  1 to 2 months.      If you have any questions or concerns, please feel free to contact me either at the phone number below or with a MyChart message.   Keep up the good work!  [name], PharmD Clinical Pharmacist [facility] [phone] (direct line)  [phone] (main office number)   Patient verbalizes understanding of instructions and care plan provided today and agrees to view in [place]. Active MyChart status and patient understanding of how to access instructions and care plan via MyChart confirmed with patient.

## 2021-11-03 ENCOUNTER — Encounter: Payer: Medicare Other | Admitting: Physical Medicine & Rehabilitation

## 2021-11-03 NOTE — Telephone Encounter (Signed)
Pt's daughter called to reschedule pt appt to have pt monitor put on

## 2021-11-03 NOTE — Therapy (Signed)
OUTPATIENT PHYSICAL THERAPY TREATMENT   Patient Name: [name] MRN: [id number] DOB:07/11/1928, 88 y.o., male Today's Date: 11/04/2021   PCP: Yvonne Lowne Chase REFERRING PROVIDER: Yvonne Lowne Chase   PT End of Session - 11/04/21 1104     Visit Number 5    Date for PT Re-Evaluation 01/11/22    PT Start Time 1100    PT Stop Time 1139    PT Time Calculation (min) 39 min    Equipment Utilized During Treatment Gait belt    Activity Tolerance Patient tolerated treatment well    Behavior During Therapy WFL for tasks assessed/performed                Past Medical History:  Diagnosis Date   Adenocarcinoma (HCC) 1985   colon s/p right hemicolectomy and chemotherapy. F/U with cancer center and Dr. Newman rotinely   Depression    Eczema    Epistaxis    f/u per ENT   Erectile dysfunction    Gastric ulcer 12/11   H-Pylori Tx, EGD 3-12: gastritis   Hay fever    Head injury 11/2017   with fall   Hyperlipemia    Hypertension    Hypothyroidism    Hypothyroid   Insomnia    Osteopenia     per DEXA 07-2008 (Rx fosamax)   Rheumatoid arthritis (HCC)    Stroke (HCC)     " balance issue"   Past Surgical History:  Procedure Laterality Date   COLON SURGERY     Right / Adenocarcinoma / Chemo   COLONOSCOPY  2005, 2008   Dr. Newman   INGUINAL HERNIA REPAIR     Right   IR ANGIO INTRA EXTRACRAN SEL COM CAROTID INNOMINATE BILAT MOD SED  12/13/2017   IR ANGIO VERTEBRAL SEL VERTEBRAL BILAT MOD SED  12/13/2017   IR PTA INTRACRANIAL  12/18/2017   RADIOLOGY WITH ANESTHESIA N/A 12/18/2017   Procedure: Angioplasty with stenting;  Surgeon: Deveshwar, Sanjeev, MD;  Location: [facility] OR;  Service: Radiology;  Laterality: N/A;   Patient Active Problem List   Diagnosis Date Noted   Requires assistance with activities of daily living (ADL) 10/11/2021   Bronchitis 10/11/2021   Trapezium fracture 09/09/2021   CMC arthritis, thumb, degenerative 09/09/2021   Left wrist pain 09/05/2021   Radial  nerve palsy, left 09/05/2021   Acute shoulder pain due to trauma, left 07/29/2021   Slow transit constipation    Stroke (cerebrum) (HCC) 07/07/2021   CVA (cerebral vascular accident) (HCC) with intracranial vascular stenosis 06/29/2021   Intracranial vascular stenosis 06/29/2021   Prediabetes 06/29/2021   Normocytic anemia 06/29/2021   History of stroke 06/29/2021   Other drug-induced neutropenia (HCC) 06/09/2021   Malignant neoplasm of ascending colon (HCC) 06/09/2021   Stenosis of artery (HCC) 06/09/2021   Preventative health care 03/01/2021   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   Kidney disease, chronic, stage III (moderate, EGFR 30-59 ml/min) (HCC) 08/07/2020   Leukopenia 08/07/2020   Acute respiratory failure with hypoxia (HCC) 08/07/2020   SIRS (systemic inflammatory response syndrome) (HCC) 08/07/2020   Methotrexate adverse reaction, initial encounter    Acute pain of right shoulder 04/14/2020   Lower extremity edema 11/13/2019   Head trauma 11/13/2019   Dyslipidemia 11/13/2019   Memory loss 07/28/2019   Chronic right shoulder pain 07/28/2019   Frequent falls 07/28/2019   Balance problem 07/28/2019   Dysphagia 07/28/2019   Traumatic hemorrhage of cerebrum, unspecified, without loss of consciousness, initial encounter (HCC)    Basilar artery stenosis   with infarction (HCC) 12/18/2017   TIA (transient ischemic attack) 12/10/2017   Rheumatoid arthritis (HCC)    Chest pain 06/02/2011   General medical examination 04/11/2011   PEPTIC ULCER DISEASE, HELICOBACTER PYLORI POSITIVE 07/12/2010   PERSONAL HISTORY MALIG NEOPLASM LARGE INTESTINE 05/18/2010   MITRAL VALVE DISORDERS 04/05/2009   HOARSENESS 12/04/2008   OSTEOPENIA 08/26/2008   HYPERTRIGLYCERIDEMIA 07/23/2008   EPISTAXIS 07/26/2007   ERECTILE DYSFUNCTION 02/22/2007   ECZEMA 12/07/2006   LEG CRAMPS 10/09/2006   Hypothyroidism 08/28/2006   Essential hypertension 08/28/2006   HAY FEVER 08/28/2006   INSOMNIA 08/28/2006     ONSET DATE: 07/07/2021  REFERRING DIAG: Z86.73  THERAPY DIAG:  Cerebrovascular accident (CVA) due to thrombosis of cerebral artery (HCC)  Other abnormalities of gait and mobility  Muscle weakness (generalized)  Unspecified lack of coordination  Rationale for Evaluation and Treatment Rehabilitation  SUBJECTIVE:                                                                                                                                                                                              SUBJECTIVE STATEMENT: Patient reports no falls since the last time and no pain. Translator was not present until last 5mins ofb visit.   Pt accompanied by: caregiver  PERTINENT HISTORY: colon surgery, head inury 11/2017, requires assistance w/ADLs, bronchitis 10/11/21, L radial nerve palsy 08/2021, hx of stroke and CVA, stage 3 kidney disease, memory loss, frequent falls (the most recent being 08/27/21 and he hurt his shoulder)  PAIN:  Are you having pain? No  PRECAUTIONS: Fall  WEIGHT BEARING RESTRICTIONS No  FALLS: Has patient fallen in last 6 months? Yes. Number of falls multiple and patient denied that he had falls but chart review says otherwise  LIVING ENVIRONMENT: Lives with: lives with their family Lives in: House/apartment Stairs: Yes: Internal: 12 steps; bilateral but cannot reach both Has following equipment at home: Walker - 2 wheeled  PLOF: Needs assistance with ADLs and Needs assistance with gait  PATIENT GOALS walk with walker no assistance  OBJECTIVE:   COGNITION: Overall cognitive status: Impaired: Memory: Impaired: Working Short term Long term Procedural and History of cognitive impairments - at baseline Hx of intermittent confusion, unable to answer questions with full understanding   LOWER EXTREMITY ROM:   no objective measurements taken, but noticeable hip tightness with gait, and IR/ER tightness when tested in wheelchair  Active  Right Eval  Left Eval  Hip flexion    Hip extension    Hip abduction    Hip adduction    Hip internal rotation    Hip external rotation      Knee flexion    Knee extension    Ankle dorsiflexion    Ankle plantarflexion    Ankle inversion    Ankle eversion     (Blank rows = not tested)  LOWER EXTREMITY MMT:    MMT Right Eval Left Eval  Hip flexion 4- 4-  Hip extension    Hip abduction 3+ 3+  Hip adduction 4 4  Hip internal rotation    Hip external rotation    Knee flexion 4- 4-  Knee extension 4- 4-  Ankle dorsiflexion 4 4  Ankle plantarflexion 4 4  Ankle inversion    Ankle eversion    (Blank rows = not tested)  UPPER EXTREMITY ROM:     Active  Right Eval Left Eval  Shoulder flexion 125 110  Shoulder extension    Shoulder abduction 73 90  Shoulder adduction    Shoulder IR    Shoulder ER WFL WFL  Elbow flexion WNL WNL  Elbow extension WNL WNL   (Blank rows = not tested)  UPPER EXTREMITY MMT:     MMT Right Eval Left Eval  Shoulder flexion 3 3  Shoulder extension    Shoulder abduction 2+ 2+  Shoulder adduction    Shoulder IR 3+ 3+  Shoulder ER 3+ 3+  Elbow flexion 4- 4-  Elbow extension 4- 4-  (Blank rows = not tested)  BED MOBILITY:  TBD  TRANSFERS: Assistive device utilized: Walker - 2 wheeled and Wheelchair (manual)  Sit to stand: Mod A Stand to sit: Mod A Patient has a tendency to push backwards with transfers  GAIT: Gait pattern: step to pattern, decreased step length- Right, decreased step length- Left, decreased stride length, shuffling, ataxic, trendelenburg, decreased trunk rotation, trunk flexed, and wide BOS Distance walked: 10ft Assistive device utilized: Walker - 2 wheeled Level of assistance: Mod A Comments: modA to stand from w/c and walk 10ft, unsteadiness on feet especially with turns, slow gait, wide BOS  FUNCTIONAL TESTs:  5 times sit to stand: TBD (unable to do w/o modA) Timed up and go (TUG): 1min 30sec w/modA-maxA   TODAY'S  TREATMENT:  11/03/21 Nustep L2, 5mins STS pushing from arm rest 2x10 Walking with cane 1 lap inside HHA side steps  Seated rows w/green band 2x12   LAQ 3# 2x10   11/01/21 Standing marches 2#  Standing abduction 2# Side stepping in parallel bars Walking with one HHA in parallel bars Walking w/quad cane 50ft w/minA Standing shoulder flexion with red ball 2x10  10/27/21: Nustep Level 3 x 5 minutes Standing 2# marching and hip abduction 2x10 int he Pbars Standing ball toss with SBA Side stepping and backward walking HHA walking 100' x2 with w/c follow, working on speed and step length     PATIENT EDUCATION: Education details: POC  Person educated: Patient and Caregiver Education method: Explanation, Demonstration, Tactile cues, and Verbal cues Education comprehension: returned demonstration, verbal cues required, tactile cues required, and needs further education   HOME EXERCISE PROGRAM: Access Code: [email or web address]    GOALS: Goals reviewed with patient? Yes  SHORT TERM GOALS: Target date: 11/30/21  Patient will complete HEP Baseline: Goal status: INITIAL    LONG TERM GOALS: Target date: 01/11/22  Patient will demonstrate 4/5 strength in UE to be able to complete ADLs with minA Baseline:  Goal status: INITIAL  2.  Patient will ambulate 50ft with minA or better to be able to navigate inside home Baseline:  Goal status: INITIAL  3.  Patient will demonstrate >=   4/5 strength in LE to be able to complete transfers with less assistance and become household ambulator Baseline:  Goal status: INITIAL  4.  Patient will score <1min on TUG using rolling walker and minA Baseline:  Goal status: INITIAL   ASSESSMENT:  CLINICAL IMPRESSION: Patient is showing good improvements with LE strength and gait training. He is able to walk 1 lap with quad cane and minA he does not put much weight through the cane when walking thus showing good balance with gait. He requires cues to  take big steps. Tried side stepping with 2HHA, patient tends to drag L foot and requires cues to pick up feet.     OBJECTIVE IMPAIRMENTS Abnormal gait, decreased activity tolerance, decreased balance, decreased cognition, decreased coordination, decreased knowledge of condition, decreased mobility, difficulty walking, decreased ROM, decreased strength, and decreased safety awareness.   ACTIVITY LIMITATIONS sitting, standing, transfers, bed mobility, bathing, toileting, dressing, and locomotion level  PARTICIPATION LIMITATIONS: cleaning, interpersonal relationship, community activity, and home maintenance  PERSONAL FACTORS Age, Behavior pattern, and 3+ comorbidities: frequent falls, hx of strokes, memory loss  are also affecting patient's functional outcome.   REHAB POTENTIAL: Fair age, low functional level, and cognitive factors may cause difficulty with treatment  CLINICAL DECISION MAKING: Evolving/moderate complexity  EVALUATION COMPLEXITY: Moderate  PLAN: PT FREQUENCY: 2x/week  PT DURATION: 12 weeks  PLANNED INTERVENTIONS: Therapeutic exercises, Therapeutic activity, Neuromuscular re-education, Balance training, Gait training, Patient/Family education, Joint mobilization, Stair training, Wheelchair mobility training, and Re-evaluation  PLAN FOR NEXT SESSION: continue with gait training and LE strengthening     , PT 11/04/2021, 11:43 AM        

## 2021-11-04 ENCOUNTER — Telehealth: Payer: Self-pay | Admitting: *Deleted

## 2021-11-04 ENCOUNTER — Ambulatory Visit: Payer: Medicare Other

## 2021-11-04 DIAGNOSIS — M6281 Muscle weakness (generalized): Secondary | ICD-10-CM | POA: Diagnosis not present

## 2021-11-04 DIAGNOSIS — R279 Unspecified lack of coordination: Secondary | ICD-10-CM

## 2021-11-04 DIAGNOSIS — R2689 Other abnormalities of gait and mobility: Secondary | ICD-10-CM | POA: Diagnosis not present

## 2021-11-04 DIAGNOSIS — I6330 Cerebral infarction due to thrombosis of unspecified cerebral artery: Secondary | ICD-10-CM | POA: Diagnosis not present

## 2021-11-04 NOTE — Chronic Care Management (AMB) (Signed)
  Chronic Care Management   Note  11/04/2021 Name: [name] MRN: [id number] DOB: 1929-01-28  [name] is a 88 y.o. year old male who is a primary care patient of [name], DO. [name] is currently enrolled in care management services. An additional referral for Licensed Clinical SW was placed.   Follow up plan: Unsuccessful telephone outreach attempt made. A HIPAA compliant phone message was left for the patient providing contact information and requesting a return call.   Julian Hy, [place] Management  Direct Dial: [phone]

## 2021-11-07 ENCOUNTER — Ambulatory Visit: Payer: Medicare Other

## 2021-11-07 DIAGNOSIS — R2689 Other abnormalities of gait and mobility: Secondary | ICD-10-CM | POA: Diagnosis not present

## 2021-11-07 DIAGNOSIS — R279 Unspecified lack of coordination: Secondary | ICD-10-CM | POA: Diagnosis not present

## 2021-11-07 DIAGNOSIS — I6330 Cerebral infarction due to thrombosis of unspecified cerebral artery: Secondary | ICD-10-CM | POA: Diagnosis not present

## 2021-11-07 DIAGNOSIS — M6281 Muscle weakness (generalized): Secondary | ICD-10-CM

## 2021-11-07 NOTE — Therapy (Signed)
OUTPATIENT PHYSICAL THERAPY TREATMENT   Patient Name: [name] MRN: [id number] DOB:08-30-1928, 88 y.o., male Today's Date: 11/07/2021   PCP: [name] REFERRING PROVIDER: [name]   PT End of Session - 11/07/21 1103     Visit Number 6    Date for PT Re-Evaluation 01/11/22    PT Start Time 1103    PT Stop Time 1143    PT Time Calculation (min) 40 min    Equipment Utilized During Treatment Gait belt    Activity Tolerance Patient tolerated treatment well    Behavior During Therapy WFL for tasks assessed/performed                Past Medical History:  Diagnosis Date   Adenocarcinoma ([place]) 1985   colon s/p right hemicolectomy and chemotherapy. F/U with cancer center and Dr. Lucia Gaskins rotinely   Depression    Eczema    Epistaxis    f/u per ENT   Erectile dysfunction    Gastric ulcer 12/11   H-Pylori Tx, EGD 3-12: gastritis   Hay fever    Head injury 11/2017   with fall   Hyperlipemia    Hypertension    Hypothyroidism    Hypothyroid   Insomnia    Osteopenia     per DEXA 07-2008 (Rx fosamax)   Rheumatoid arthritis ([place])    Stroke ([place])     " balance issue"   Past Surgical History:  Procedure Laterality Date   COLON SURGERY     Right / Adenocarcinoma / Chemo   COLONOSCOPY  2005, 2008   Dr. Lucia Gaskins   INGUINAL HERNIA REPAIR     Right   IR ANGIO INTRA EXTRACRAN SEL COM CAROTID INNOMINATE BILAT MOD SED  12/13/2017   IR ANGIO VERTEBRAL SEL VERTEBRAL BILAT MOD SED  12/13/2017   IR PTA INTRACRANIAL  12/18/2017   RADIOLOGY WITH ANESTHESIA N/A 12/18/2017   Procedure: Angioplasty with stenting;  Surgeon: Luanne Bras, MD;  Location: [facility];  Service: Radiology;  Laterality: N/A;   Patient Active Problem List   Diagnosis Date Noted   Requires assistance with activities of daily living (ADL) 10/11/2021   Bronchitis 10/11/2021   Trapezium fracture 09/09/2021   CMC arthritis, thumb, degenerative 09/09/2021   Left wrist pain 09/05/2021   Radial  nerve palsy, left 09/05/2021   Acute shoulder pain due to trauma, left 07/29/2021   Slow transit constipation    Stroke (cerebrum) ([place]) 07/07/2021   CVA (cerebral vascular accident) [facility]) with intracranial vascular stenosis 06/29/2021   Intracranial vascular stenosis 06/29/2021   Prediabetes 06/29/2021   Normocytic anemia 06/29/2021   History of stroke 06/29/2021   Other drug-induced neutropenia ([place]) 06/09/2021   Malignant neoplasm of ascending colon ([place]) 06/09/2021   Stenosis of artery ([place]) 06/09/2021   Preventative health care 03/01/2021   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   Kidney disease, chronic, stage III (moderate, EGFR 30-59 ml/min) (HCC) 08/07/2020   Leukopenia 08/07/2020   Acute respiratory failure with hypoxia ([place]) 08/07/2020   SIRS (systemic inflammatory response syndrome) ([place]) 08/07/2020   Methotrexate adverse reaction, initial encounter    Acute pain of right shoulder 04/14/2020   Lower extremity edema 11/13/2019   Head trauma 11/13/2019   Dyslipidemia 11/13/2019   Memory loss 07/28/2019   Chronic right shoulder pain 07/28/2019   Frequent falls 07/28/2019   Balance problem 07/28/2019   Dysphagia 07/28/2019   Traumatic hemorrhage of cerebrum, unspecified, without loss of consciousness, initial encounter ([place])    Basilar artery stenosis  with infarction ([place]) 12/18/2017   TIA (transient ischemic attack) 12/10/2017   Rheumatoid arthritis ([place])    Chest pain 06/02/2011   General medical examination 04/11/2011   PEPTIC ULCER DISEASE, HELICOBACTER PYLORI POSITIVE 07/12/2010   PERSONAL HISTORY MALIG NEOPLASM LARGE INTESTINE 05/18/2010   MITRAL VALVE DISORDERS 04/05/2009   HOARSENESS 12/04/2008   OSTEOPENIA 08/26/2008   HYPERTRIGLYCERIDEMIA 07/23/2008   EPISTAXIS 07/26/2007   ERECTILE DYSFUNCTION 02/22/2007   ECZEMA 12/07/2006   LEG CRAMPS 10/09/2006   Hypothyroidism 08/28/2006   Essential hypertension 08/28/2006   HAY FEVER 08/28/2006   INSOMNIA 08/28/2006     ONSET DATE: 07/07/2021  REFERRING DIAG: A19.37  THERAPY DIAG:  Cerebrovascular accident (CVA) due to thrombosis of cerebral artery (HCC)  Other abnormalities of gait and mobility  Muscle weakness (generalized)  Unspecified lack of coordination  Rationale for Evaluation and Treatment Rehabilitation  SUBJECTIVE:                                                                                                                                                                                              SUBJECTIVE STATEMENT: Patient and caregiver reports no falls since the last time and no pain. He is doing well and making good progress but still having trouble with sidesteps when trying to get into kitchen table.   Pt accompanied by: caregiver  PERTINENT HISTORY: colon surgery, head inury 11/2017, requires assistance w/ADLs, bronchitis 10/11/21, L radial nerve palsy 08/2021, hx of stroke and CVA, stage 3 kidney disease, memory loss, frequent falls (the most recent being 08/27/21 and he hurt his shoulder)  PAIN:  Are you having pain? No  PRECAUTIONS: Fall  WEIGHT BEARING RESTRICTIONS No  FALLS: Has patient fallen in last 6 months? Yes. 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, historic buildings and History of cognitive impairments - at baseline Hx of intermittent confusion, unable to answer questions with full understanding   LOWER EXTREMITY ROM:   no objective measurements taken, but noticeable hip tightness with  gait, and IR/ER tightness when tested in wheelchair  Active  Right Eval Left Eval  Hip flexion    Hip extension    Hip abduction    Hip adduction  Hip internal rotation    Hip external rotation    Knee flexion    Knee extension    Ankle dorsiflexion    Ankle plantarflexion    Ankle inversion    Ankle eversion     (Blank rows = not tested)  LOWER EXTREMITY MMT:    MMT Right Eval Left Eval  Hip flexion 4- 4-  Hip extension    Hip abduction 3+ 3+  Hip adduction 4 4  Hip internal rotation    Hip external rotation    Knee flexion 4- 4-  Knee extension 4- 4-  Ankle dorsiflexion 4 4  Ankle plantarflexion 4 4  Ankle inversion    Ankle eversion    (Blank rows = not tested)  UPPER EXTREMITY ROM:     Active  Right Eval Left Eval  Shoulder flexion 125 110  Shoulder extension    Shoulder abduction 73 90  Shoulder adduction    Shoulder IR    Shoulder ER [facility] WFL  Elbow flexion WNL WNL  Elbow extension WNL WNL   (Blank rows = not tested)  UPPER EXTREMITY MMT:     MMT Right Eval Left Eval  Shoulder flexion 3 3  Shoulder extension    Shoulder abduction 2+ 2+  Shoulder adduction    Shoulder IR 3+ 3+  Shoulder ER 3+ 3+  Elbow flexion 4- 4-  Elbow extension 4- 4-  (Blank rows = not tested)  BED MOBILITY:  TBD  TRANSFERS: Assistive device utilized: Environmental consultant - 2 wheeled and Wheelchair (manual)  Sit to stand: Mod A Stand to sit: Mod A Patient has a tendency to push backwards with transfers  GAIT: Gait pattern: step to pattern, decreased step length- Right, decreased step length- Left, decreased stride length, shuffling, ataxic, trendelenburg, decreased trunk rotation, trunk flexed, and wide BOS Distance walked: 22f Assistive device utilized: Walker - 2 wheeled Level of assistance: Mod A Comments: modA to stand from w/c and walk 110f unsteadiness on feet especially with turns, slow gait, wide BOS  FUNCTIONAL TESTs:  5 times sit to stand: TBD (unable to  do w/o modA) Timed up and go (TUG): 43m57m30sec w/modA-maxA   TODAY'S TREATMENT:  11/07/21 Bike L2, 5 mins  LAQ 3# 2x10   TB hamstring curls seated 2x10   Banded ankle DF 2x10  Ladder drill for big steps forwards and sideways [facility]HH[facility]23 Nustep L2, 5mi68mSTS pushing from arm rest 2x10 Walking with cane 1 lap inside HHA side steps  Seated rows w/green band 2x12   LAQ 3# 2x10   11/01/21 Standing marches 2#  Standing abduction 2# Side stepping in parallel bars Walking with one HHA in parallel bars Walking w/quad cane [id number] Standing shoulder flexion with red ball 2x10  10/27/21: Nustep Level 3 x 5 minutes Standing 2# marching and hip abduction 2x10 int he Pbars Standing ball toss with SBA Side stepping and backward walking HHA walking 100' x2 with w/c follow, working on speed and step length     PATIENT EDUCATION: Education details: POC  Person educated: Patient and CaregArmed forces training and education officerod: ExplaConsulting civil engineeronMedia plannertiCorporate treasurer, and Verbal cues Education comprehension: returned demonstration, verbal cues required, tactile cues required, and needs further education   HOME EXERCISE PROGRAM: Access Code: [email or web address]    GOALS: Goals reviewed with patient? Yes  SHORT TERM GOALS: Target date: 11/30/21  Patient will complete HEP Baseline: Goal status: INITIAL    LONG TERM GOALS: Target date: 01/11/22  Patient will demonstrate  4/5 strength in UE to be able to complete ADLs with minA Baseline:  Goal status: INITIAL  2.  Patient will ambulate 81f with minA or better to be able to navigate inside home Baseline:  Goal status: INITIAL  3.  Patient will demonstrate >= 4/5 strength in LE to be able to complete transfers with less assistance and become household ambulator Baseline:  Goal status: INITIAL  4.  Patient will score <150m on TUG using rolling walker and minA Baseline:  Goal status: INITIAL   ASSESSMENT:  CLINICAL IMPRESSION: Patient  walked with more difficulty today compared to last visit. According to caretaker he has taken some steps without an assistive device but continues having trouble with lifting up L foot. Focused on LE stregthening and gait training for taking big steps. Worked on stepping in ladder so he has a cue to step over lines, able to do with 2HLahaye [facility] Incut needs a break due to fatigue. Continue to progress.     OBJECTIVE IMPAIRMENTS Abnormal gait, decreased activity tolerance, decreased balance, decreased cognition, decreased coordination, decreased knowledge of condition, decreased mobility, difficulty walking, decreased ROM, decreased strength, and decreased safety awareness.   ACTIVITY LIMITATIONS sitting, standing, transfers, bed mobility, bathing, toileting, dressing, and locomotion level  PARTICIPATION LIMITATIONS: cleaning, interpersonal relationship, community activity, and home maintenance  PERSONAL FACTORS Age, Behavior pattern, and 3+ comorbidities: frequent falls, hx of strokes, memory loss  are also affecting patient's functional outcome.   REHAB POTENTIAL: Fair age, low functional level, and cognitive factors may cause difficulty with treatment  CLINICAL DECISION MAKING: Evolving/moderate complexity  EVALUATION COMPLEXITY: Moderate  PLAN: PT FREQUENCY: 2x/week  PT DURATION: 12 weeks  PLANNED INTERVENTIONS: Therapeutic exercises, Therapeutic activity, Neuromuscular re-education, Balance training, Gait training, Patient/Family education, Joint mobilization, Stair training, Wheelchair mobility training, and Re-evaluation  PLAN FOR NEXT SESSION: continue with gait training and LE strengthening   MoAndris BaumannPT 11/07/2021, 11:46 AM

## 2021-11-08 ENCOUNTER — Telehealth: Payer: Self-pay | Admitting: Internal Medicine

## 2021-11-08 ENCOUNTER — Encounter: Payer: Self-pay | Admitting: Family Medicine

## 2021-11-08 ENCOUNTER — Ambulatory Visit (INDEPENDENT_AMBULATORY_CARE_PROVIDER_SITE_OTHER): Payer: Medicare Other | Admitting: Family Medicine

## 2021-11-08 VITALS — BP 130/70 | HR 74 | Temp 98.4°F | Resp 18 | Ht 59.0 in

## 2021-11-08 DIAGNOSIS — E039 Hypothyroidism, unspecified: Secondary | ICD-10-CM | POA: Diagnosis not present

## 2021-11-08 DIAGNOSIS — E785 Hyperlipidemia, unspecified: Secondary | ICD-10-CM

## 2021-11-08 DIAGNOSIS — I10 Essential (primary) hypertension: Secondary | ICD-10-CM | POA: Diagnosis not present

## 2021-11-08 DIAGNOSIS — R1310 Dysphagia, unspecified: Secondary | ICD-10-CM | POA: Diagnosis not present

## 2021-11-08 NOTE — Progress Notes (Signed)
Subjective:   By signing my name below, I, [name], attest that this documentation has been prepared under the direction and in the presence of Christian Held, DO  11/08/2021     Patient ID: [name], male    DOB: 1928-10-21, 88 y.o.   MRN: [id number]  Chief Complaint  Patient presents with   Dysphagia    HPI Patient is in today for a office visit. He is present with his wife and a [place] interpretor to help assist with communication.   He complains of difficulty swallowing. He denies choking nor stomach pain. Eating dry rice typically worsen his symptoms. He is interested in a swallowing study.   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 F/U with cancer center and Dr. Lucia Gaskins rotinely   Depression    Eczema    Epistaxis    f/u per ENT   Erectile dysfunction    Gastric ulcer 12/11   H-Pylori Tx, EGD 3-12: gastritis   Hay fever    Head injury 11/2017   with fall   Hyperlipemia    Hypertension    Hypothyroidism    Hypothyroid   Insomnia    Osteopenia     per DEXA 07-2008 (Rx fosamax)   Rheumatoid arthritis ([place])    Stroke ([place])     " balance issue"    Past Surgical History:  Procedure Laterality Date   COLON SURGERY     Right / Adenocarcinoma / Chemo   COLONOSCOPY  2005, 2008   Dr. Lucia Gaskins   INGUINAL HERNIA REPAIR     Right   IR ANGIO INTRA EXTRACRAN SEL COM CAROTID INNOMINATE BILAT MOD SED  12/13/2017   IR ANGIO VERTEBRAL SEL VERTEBRAL BILAT MOD SED  12/13/2017   IR PTA INTRACRANIAL  12/18/2017   RADIOLOGY WITH ANESTHESIA N/A 12/18/2017   Procedure: Angioplasty with stenting;  Surgeon: Luanne Bras, MD;  Location: [facility];  Service: Radiology;  Laterality: N/A;    Family History  Problem Relation Age of Onset   Diabetes Neg Hx    Heart attack Neg Hx    Colon cancer Neg Hx    Prostate cancer Neg Hx     Social History   Socioeconomic History   Marital status:  Married    Spouse name: Not on file   Number of children: 2   Years of education: Not on file   Highest education level: Not on file  Occupational History    Employer: RETIRED  Tobacco Use   Smoking status: Never   Smokeless tobacco: Never  Vaping Use   Vaping Use: Never used  Substance and Sexual Activity   Alcohol use: No    Alcohol/week: 0.0 standard drinks of alcohol   Drug use: No   Sexual activity: Not on file  Other Topics Concern   Not on file  Social History Narrative   Original from [place]   Vegetarian   Daily caffeine use one per day   Social Determinants of Health   Financial Resource Strain: Low Risk  (06/02/2021)   Overall Financial Resource Strain (CARDIA)    Difficulty of Paying Living Expenses: Not hard at all  Food Insecurity: Not on file  Transportation Needs: Not on file  Physical Activity: Insufficiently Active (11/02/2021)   Exercise Vital Sign    Days of Exercise per Week: 2 days    Minutes of Exercise per Session: 30 min  Stress:  Not on file  Social Connections: Not on file  Intimate Partner Violence: Not on file    Outpatient Medications Prior to Visit  Medication Sig Dispense Refill   acetaminophen (TYLENOL) 500 MG tablet Take 1 tablet (500 mg total) by mouth at bedtime. 100 tablet 0   aspirin EC 325 MG tablet TAKE 1 TABLET BY MOUTH EVERY DAY 30 tablet 0   atorvastatin (LIPITOR) 40 MG tablet Take 1 tablet (40 mg total) by mouth daily. 90 tablet 1   capsicum (ZOSTRIX) 0.075 % topical cream Apply 1 application. topically 4 (four) times daily -  with meals and at bedtime. Be sure to wear glove and do not touch eye, mouth or nose (any mucous membranes) Apply to left shoulder. 57 g 0   clopidogrel (PLAVIX) 75 MG tablet Take 1 tablet (75 mg total) by mouth daily. Please call office at [phone] to schedule appt with Dr. Leonie Man for continued refills 30 tablet 0   docusate sodium (COLACE) 100 MG capsule Take 2 capsules (200 mg total) by mouth daily.  60 capsule 0   doxazosin (CARDURA) 1 MG tablet Take 1 tablet (1 mg total) by mouth at bedtime. 90 tablet 3   levothyroxine (SYNTHROID) 100 MCG tablet Take 1 tablet (100 mcg total) by mouth daily before breakfast. 90 tablet 1   mirabegron ER (MYRBETRIQ) 25 MG TB24 tablet Take 25 mg by mouth at bedtime.     Multiple Vitamins-Minerals (MULTIVITAMIN ADULT) CHEW Chew 2 each by mouth daily.     NONFORMULARY OR COMPOUNDED ITEM 4 wheeled walker   #1  dx frequent falls, weakness 1 each 0   No facility-administered medications prior to visit.    Allergies  Allergen Reactions   Methotrexate Derivatives Other (See Comments)    Mouth sores / pancytopenia   Asa [Aspirin] Other (See Comments)    Gastric symptoms - can tolerate 81 mg aspirin   Penicillin G Rash    Did it involve swelling of the face/tongue/throat, SOB, or low BP? Unknown Did it involve sudden or severe rash/hives, skin peeling, or any reaction on the inside of your mouth or nose? Unknown Did you need to seek medical attention at a hospital or doctor's office? Unknown When did it last happen?   unknown    If all above answers are "NO", may proceed with cephalosporin use.    Review of Systems  Constitutional:  Negative for fever and malaise/fatigue.  HENT:  Negative for congestion.        (+)difficulty swallowing.   Eyes:  Negative for blurred vision.  Respiratory:  Negative for shortness of breath.   Cardiovascular:  Negative for chest pain, palpitations and leg swelling.  Gastrointestinal:  Negative for abdominal pain, blood in stool and nausea.  Genitourinary:  Negative for dysuria and frequency.  Musculoskeletal:  Negative for falls.  Skin:  Negative for rash.  Neurological:  Negative for dizziness, loss of consciousness and headaches.  Endo/Heme/Allergies:  Negative for environmental allergies.  Psychiatric/Behavioral:  Negative for depression. The patient is not nervous/anxious.        Objective:    Physical  Exam Vitals and nursing note reviewed.  Constitutional:      General: He is not in acute distress.    Appearance: Normal appearance. He is well-developed. He is not ill-appearing.  HENT:     Head: Normocephalic and atraumatic.     Right Ear: External ear normal.     Left Ear: External ear normal.  Eyes:  Extraocular Movements: Extraocular movements intact.     Pupils: Pupils are equal, round, and reactive to light.  Neck:     Thyroid: No thyromegaly.  Cardiovascular:     Rate and Rhythm: Normal rate and regular rhythm.     Heart sounds: Normal heart sounds. No murmur heard.    No gallop.  Pulmonary:     Effort: Pulmonary effort is normal. No respiratory distress.     Breath sounds: Normal breath sounds. No wheezing or rales.  Chest:     Chest wall: No tenderness.  Musculoskeletal:     Cervical back: Normal range of motion and neck supple.     Right hip: Tenderness present. Normal range of motion. Normal strength.     Left hip: Tenderness present. Normal range of motion. Normal strength.     Right foot: Bony tenderness present. No swelling.     Left foot: Bony tenderness present. No swelling.  Skin:    General: Skin is warm and dry.  Neurological:     Mental Status: He is alert and oriented to person, place, and time.  Psychiatric:        Behavior: Behavior normal.        Thought Content: Thought content normal.        Judgment: Judgment normal.     BP 130/70 (BP Location: Left Arm, Patient Position: Sitting, Cuff Size: Normal)   Pulse 74   Temp 98.4 F (36.9 C) (Oral)   Resp 18   Ht 4' 11"  (1.499 m)   SpO2 97%   BMI 25.57 kg/m  Wt Readings from Last 3 Encounters:  10/03/21 126 lb 9.6 oz (57.4 kg)  09/09/21 125 lb (56.7 kg)  08/27/21 125 lb (56.7 kg)    Diabetic Foot Exam - Simple   No data filed    Lab Results  Component Value Date   WBC 11.6 (H) 08/27/2021   HGB 13.6 08/27/2021   HCT 42.4 08/27/2021   PLT 206 08/27/2021   GLUCOSE 106 (H) 09/05/2021    CHOL 128 09/05/2021   TRIG 375.0 (H) 09/05/2021   HDL 36.50 (L) 09/05/2021   LDLDIRECT 51.0 09/05/2021   LDLCALC 102 (H) 06/29/2021   ALT 36 09/05/2021   AST 27 09/05/2021   NA 142 09/05/2021   K 4.3 09/05/2021   CL 106 09/05/2021   CREATININE 1.45 09/05/2021   BUN 28 (H) 09/05/2021   CO2 29 09/05/2021   TSH 1.30 09/05/2021   PSA 2.76 03/03/2021   INR 1.1 08/07/2020   HGBA1C 5.8 (H) 06/29/2021    Lab Results  Component Value Date   TSH 1.30 09/05/2021   Lab Results  Component Value Date   WBC 11.6 (H) 08/27/2021   HGB 13.6 08/27/2021   HCT 42.4 08/27/2021   MCV 93.0 08/27/2021   PLT 206 08/27/2021   Lab Results  Component Value Date   NA 142 09/05/2021   K 4.3 09/05/2021   CO2 29 09/05/2021   GLUCOSE 106 (H) 09/05/2021   BUN 28 (H) 09/05/2021   CREATININE 1.45 09/05/2021   BILITOT 0.2 09/05/2021   ALKPHOS 74 09/05/2021   AST 27 09/05/2021   ALT 36 09/05/2021   PROT 7.0 09/05/2021   ALBUMIN 3.9 09/05/2021   CALCIUM 9.5 09/05/2021   ANIONGAP 8 08/27/2021   EGFR 55 (L) 08/06/2020   GFR 42.20 (L) 09/05/2021   Lab Results  Component Value Date   CHOL 128 09/05/2021   Lab Results  Component Value Date  HDL 36.50 (L) 09/05/2021   Lab Results  Component Value Date   LDLCALC 102 (H) 06/29/2021   Lab Results  Component Value Date   TRIG 375.0 (H) 09/05/2021   Lab Results  Component Value Date   CHOLHDL 4 09/05/2021   Lab Results  Component Value Date   HGBA1C 5.8 (H) 06/29/2021       Assessment & Plan:   Problem List Items Addressed This Visit       Unprioritized   Hypothyroidism (Chronic)    Check labs       Relevant Orders   CBC with Differential/Platelet   Comprehensive metabolic panel   TSH   H. pylori antibody, IgG   Lipid panel   Essential hypertension (Chronic)    Well controlled, no changes to meds. Encouraged heart healthy diet such as the DASH diet and exercise as tolerated.       Relevant Orders   CBC with  Differential/Platelet   Comprehensive metabolic panel   TSH   H. pylori antibody, IgG   Lipid panel   Dysphagia - Primary    Choking on food Swallow study] Refer to GI        Relevant Orders   DG SWALLOW [place]   Ambulatory referral to Gastroenterology   CBC with Differential/Platelet   Comprehensive metabolic panel   TSH   H. pylori antibody, IgG   Lipid panel   Other Visit Diagnoses     Hyperlipidemia, unspecified hyperlipidemia type       Relevant Orders   CBC with Differential/Platelet   Comprehensive metabolic panel   TSH   H. pylori antibody, IgG   Lipid panel        No orders of the defined types were placed in this encounter.   IAnn Held, DO, personally preformed the services described in this documentation.  All medical record entries made by the scribe were at my direction and in my presence.  I have reviewed the chart and discharge instructions (if applicable) and agree that the record reflects my personal performance and is accurate and complete. 11/08/2021   I,[name],acting as a Education administrator for [employer], DO.,have documented all relevant documentation on the behalf of Christian Held, DO,as directed by  Christian Held, DO while in the presence of Christian Held, DO.   Christian Held, DO

## 2021-11-08 NOTE — Assessment & Plan Note (Signed)
Check labs 

## 2021-11-08 NOTE — Patient Instructions (Addendum)
Pt will need help cutting his finger nails      Dysphagia  Dysphagia is trouble swallowing. This condition occurs when solids and liquids stick in a person's throat on the way down to the stomach, or when food takes longer to get to the stomach than usual. You may have problems swallowing food, liquids, or both. You may also have pain while trying to swallow. It may take you more time and effort to swallow something. What are the causes? This condition may be caused by: Muscle problems. These may make it difficult for you to move food and liquids through the esophagus, which is the tube that connects your mouth to your stomach. Blockages. You may have ulcers, scar tissue, or inflammation that blocks the normal passage of food and liquids. Causes of these problems include: Acid reflux from your stomach into your esophagus (gastroesophageal reflux). Infections. Radiation treatment for cancer. Medicines taken without enough fluids to wash them down into your stomach. Stroke. This can affect the nerves and make it difficult to swallow. Nerve problems. These prevent signals from being sent to the muscles of your esophagus to squeeze (contract) and move what you swallow down to your stomach. Globus pharyngeus. This is a common problem that involves a feeling like something is stuck in your throat or a sense of trouble with swallowing, even though nothing is wrong with the swallowing passages. Certain conditions, such as cerebral palsy or Parkinson's disease. What are the signs or symptoms? Common symptoms of this condition include: A feeling that solids or liquids are stuck in your throat on the way down to the stomach. Pain while swallowing. Coughing or gagging while trying to swallow. Other symptoms include: Food moving back from your stomach to your mouth (regurgitation). Noises coming from your throat. Chest discomfort when swallowing. A feeling of fullness when swallowing. Drooling,  especially when the throat is blocked. Heartburn. How is this diagnosed? This condition may be diagnosed by: Barium swallow X-ray. In this test, you will swallow a white liquid that sticks to the inside of your esophagus. X-ray images are then taken. Endoscopy. In this test, a flexible telescope is inserted down your throat to look at your esophagus and your stomach. CT scans or an MRI. How is this treated? Treatment for dysphagia depends on the cause of this condition: If the dysphagia is caused by acid reflux or infection, medicines may be used. These may include antibiotics or heartburn medicines. If the dysphagia is caused by problems with the muscles, swallowing therapy may be used to help you strengthen your swallowing muscles. You may have to do specific exercises to strengthen the muscles or stretch them. If the dysphagia is caused by a blockage or mass, procedures to remove the blockage may be done. You may need surgery and a feeding tube. You may need to make diet changes. Ask your health care provider for specific instructions. Follow these instructions at home: Medicines Take over-the-counter and prescription medicines only as told by your health care provider. If you were prescribed an antibiotic medicine, take it as told by your health care provider. Do not stop taking the antibiotic even if you start to feel better. Eating and drinking  Make any diet changes as told by your health care provider. Work with a diet and nutrition specialist (dietitian) to create an eating plan that will help you get the nutrients you need in order to stay healthy. Eat soft foods that are easier to swallow. Cut your food into small  pieces and eat slowly. Take small bites. Eat and drink only when you are sitting upright. Do not drink alcohol or caffeine. If you need help quitting, ask your health care provider. General instructions Check your weight every day to make sure you are not losing  weight. Do not use any products that contain nicotine or tobacco. These products include cigarettes, chewing tobacco, and vaping devices, such as e-cigarettes. If you need help quitting, ask your health care provider. Keep all follow-up visits. This is important. Contact a health care provider if: You lose weight because you cannot swallow. You cough when you drink liquids. You cough up partially digested food. Get help right away if: You cannot swallow your saliva. You have shortness of breath, a fever, or both. Your voice is hoarse and you have trouble swallowing. These symptoms may represent a serious problem that is an emergency. Do not wait to see if the symptoms will go away. Get medical help right away. Call your local emergency services (911 in the [place] Do not drive yourself to the hospital. Summary Dysphagia is trouble swallowing. This condition occurs when solids and liquids stick in a person's throat on the way down to the stomach. You may cough or gag while trying to swallow. Dysphagia has many possible causes. Treatment for dysphagia depends on the cause of the condition. Keep all follow-up visits. This is important. This information is not intended to replace advice given to you by your health care provider. Make sure you discuss any questions you have with your health care provider. Document Revised: 12/27/2019 Document Reviewed: 12/27/2019 Elsevier Patient Education  [place].

## 2021-11-08 NOTE — Telephone Encounter (Signed)
Dr. Nonda Lou office called to schedule an appointment with Dr. Carlean Purl for patient.  He is an established patient; however, he has not seen him since 2017, which would make him a "new patient" as far as scheduling.  Dr. Carlean Purl does not have any new patient appointments available.  This patient is having dysphagia and needs to be seen.  Patient's wife will be seeing Dr. Carlean Purl in August.  They both will need an interpreter.  Please call patient's daughter and advise.  Thank you.

## 2021-11-08 NOTE — Assessment & Plan Note (Signed)
Choking on food Swallow study] Refer to GI

## 2021-11-08 NOTE — Assessment & Plan Note (Signed)
Well controlled, no changes to meds. Encouraged heart healthy diet such as the DASH diet and exercise as tolerated.  °

## 2021-11-09 LAB — COMPREHENSIVE METABOLIC PANEL
ALT: 20 U/L (ref 0–53)
AST: 20 U/L (ref 0–37)
Albumin: 4 g/dL (ref 3.5–5.2)
Alkaline Phosphatase: 72 U/L (ref 39–117)
BUN: 27 mg/dL — ABNORMAL HIGH (ref 6–23)
CO2: 27 mEq/L (ref 19–32)
Calcium: 9.5 mg/dL (ref 8.4–10.5)
Chloride: 103 mEq/L (ref 96–112)
Creatinine, Ser: 1.48 mg/dL (ref 0.40–1.50)
GFR: 41.13 mL/min — ABNORMAL LOW (ref >60.00)
Glucose, Bld: 111 mg/dL — ABNORMAL HIGH (ref 70–99)
Potassium: 4.2 mEq/L (ref 3.5–5.1)
Sodium: 140 mEq/L (ref 135–145)
Total Bilirubin: 0.3 mg/dL (ref 0.2–1.2)
Total Protein: 6.9 g/dL (ref 6.0–8.3)

## 2021-11-09 LAB — LIPID PANEL
Cholesterol: 133 mg/dL (ref 0–200)
HDL: 33.3 mg/dL — ABNORMAL LOW (ref >39.00)
NonHDL: 99.92
Total CHOL/HDL Ratio: 4
Triglycerides: 387 mg/dL — ABNORMAL HIGH (ref 0.0–149.0)
VLDL: 77.4 mg/dL — ABNORMAL HIGH (ref 0.0–40.0)

## 2021-11-09 LAB — CBC WITH DIFFERENTIAL/PLATELET
Basophils Absolute: 0.1 10*3/uL (ref 0.0–0.1)
Basophils Relative: 0.8 % (ref 0.0–3.0)
Eosinophils Absolute: 0.2 10*3/uL (ref 0.0–0.7)
Eosinophils Relative: 1.9 % (ref 0.0–5.0)
HCT: 39.5 % (ref 39.0–52.0)
Hemoglobin: 13.1 g/dL (ref 13.0–17.0)
Lymphocytes Relative: 21.1 % (ref 12.0–46.0)
Lymphs Abs: 2 10*3/uL (ref 0.7–4.0)
MCHC: 33.2 g/dL (ref 30.0–36.0)
MCV: 89.8 fl (ref 78.0–100.0)
Monocytes Absolute: 1 10*3/uL (ref 0.1–1.0)
Monocytes Relative: 10.4 % (ref 3.0–12.0)
Neutro Abs: 6.2 10*3/uL (ref 1.4–7.7)
Neutrophils Relative %: 65.8 % (ref 43.0–77.0)
Platelets: 209 10*3/uL (ref 150.0–400.0)
RBC: 4.4 Mil/uL (ref 4.22–5.81)
RDW: 14.5 % (ref 11.5–15.5)
WBC: 9.4 10*3/uL (ref 4.0–10.5)

## 2021-11-09 LAB — TSH: TSH: 3.63 u[IU]/mL (ref 0.35–5.50)

## 2021-11-09 LAB — H. PYLORI ANTIBODY, IGG: H Pylori IgG: NEGATIVE

## 2021-11-09 LAB — LDL CHOLESTEROL, DIRECT: Direct LDL: 51 mg/dL

## 2021-11-09 NOTE — Telephone Encounter (Signed)
Spoke with Pt [name]. [name] requesting that her Dad see Dr. Carlean Purl. [name] was made aware that Dr. Carlean Purl does not have any new appointment available soon and that his first available for a pt that is already established in in August: [name] stated that he needs to be seen prior to that. Pt was scheduled to see Vicie Mutters PA on 11/24/2021 at 1:30 PM: [name] made aware: [name] verbalized understanding with all questions answered.

## 2021-11-09 NOTE — Telephone Encounter (Signed)
Left message for pt or pt daughter to call us back

## 2021-11-10 ENCOUNTER — Ambulatory Visit: Payer: Medicare Other

## 2021-11-10 NOTE — Chronic Care Management (AMB) (Signed)
  Chronic Care Management   Note  11/10/2021 Name: [name] MRN: [id number] DOB: 06-23-1928  [name] is a 88 y.o. year old male who is a primary care patient of [name], DO. [name] is currently enrolled in care management services. An additional referral for Licensed Clinical SW was placed.   Follow up plan: Telephone appointment with care management team member scheduled for: 11/15/2021  Julian Hy, [facility]  Direct Dial: [phone]

## 2021-11-11 ENCOUNTER — Other Ambulatory Visit: Payer: Self-pay | Admitting: Neurology

## 2021-11-11 ENCOUNTER — Ambulatory Visit: Payer: Medicare Other | Admitting: Physical Therapy

## 2021-11-14 ENCOUNTER — Encounter (HOSPITAL_BASED_OUTPATIENT_CLINIC_OR_DEPARTMENT_OTHER): Payer: Self-pay | Admitting: Cardiovascular Disease

## 2021-11-15 ENCOUNTER — Ambulatory Visit: Payer: Medicare Other

## 2021-11-15 ENCOUNTER — Ambulatory Visit: Payer: Medicare Other | Admitting: *Deleted

## 2021-11-15 ENCOUNTER — Inpatient Hospital Stay: Payer: Medicare Other | Admitting: Neurology

## 2021-11-15 DIAGNOSIS — I6330 Cerebral infarction due to thrombosis of unspecified cerebral artery: Secondary | ICD-10-CM | POA: Diagnosis not present

## 2021-11-15 DIAGNOSIS — R1310 Dysphagia, unspecified: Secondary | ICD-10-CM

## 2021-11-15 DIAGNOSIS — R2689 Other abnormalities of gait and mobility: Secondary | ICD-10-CM

## 2021-11-15 DIAGNOSIS — I10 Essential (primary) hypertension: Secondary | ICD-10-CM

## 2021-11-15 DIAGNOSIS — E785 Hyperlipidemia, unspecified: Secondary | ICD-10-CM

## 2021-11-15 DIAGNOSIS — Z8673 Personal history of transient ischemic attack (TIA), and cerebral infarction without residual deficits: Secondary | ICD-10-CM

## 2021-11-15 DIAGNOSIS — R531 Weakness: Secondary | ICD-10-CM

## 2021-11-15 DIAGNOSIS — M6281 Muscle weakness (generalized): Secondary | ICD-10-CM | POA: Diagnosis not present

## 2021-11-15 DIAGNOSIS — R279 Unspecified lack of coordination: Secondary | ICD-10-CM

## 2021-11-15 DIAGNOSIS — R413 Other amnesia: Secondary | ICD-10-CM

## 2021-11-15 NOTE — Therapy (Signed)
OUTPATIENT PHYSICAL THERAPY TREATMENT   Patient Name: [name] MRN: [id number] DOB:May 05, 1929, 88 y.o., male Today's Date: 11/15/2021   PCP: Seabron Spates REFERRING PROVIDER: Seabron Spates   PT End of Session - 11/15/21 1107     Visit Number 7    Date for PT Re-Evaluation 01/11/22    PT Start Time 1103    PT Stop Time 1145    PT Time Calculation (min) 42 min    Equipment Utilized During Treatment Gait belt    Activity Tolerance Patient tolerated treatment well    Behavior During Therapy WFL for tasks assessed/performed                 Past Medical History:  Diagnosis Date   Adenocarcinoma (HCC) 1985   colon s/p right hemicolectomy and chemotherapy. F/U with cancer center and Dr. Ezzard Standing rotinely   Depression    Eczema    Epistaxis    f/u per ENT   Erectile dysfunction    Gastric ulcer 12/11   H-Pylori Tx, EGD 3-12: gastritis   Hay fever    Head injury 11/2017   with fall   Hyperlipemia    Hypertension    Hypothyroidism    Hypothyroid   Insomnia    Osteopenia     per DEXA 07-2008 (Rx fosamax)   Rheumatoid arthritis (HCC)    Stroke (HCC)     " balance issue"   Past Surgical History:  Procedure Laterality Date   COLON SURGERY     Right / Adenocarcinoma / Chemo   COLONOSCOPY  2005, 2008   Dr. Ezzard Standing   INGUINAL HERNIA REPAIR     Right   IR ANGIO INTRA EXTRACRAN SEL COM CAROTID INNOMINATE BILAT MOD SED  12/13/2017   IR ANGIO VERTEBRAL SEL VERTEBRAL BILAT MOD SED  12/13/2017   IR PTA INTRACRANIAL  12/18/2017   RADIOLOGY WITH ANESTHESIA N/A 12/18/2017   Procedure: Angioplasty with stenting;  Surgeon: Julieanne Cotton, MD;  Location: [facility] OR;  Service: Radiology;  Laterality: N/A;   Patient Active Problem List   Diagnosis Date Noted   Requires assistance with activities of daily living (ADL) 10/11/2021   Bronchitis 10/11/2021   Trapezium fracture 09/09/2021   CMC arthritis, thumb, degenerative 09/09/2021   Left wrist pain 09/05/2021   Radial  nerve palsy, left 09/05/2021   Acute shoulder pain due to trauma, left 07/29/2021   Slow transit constipation    Stroke (cerebrum) (HCC) 07/07/2021   CVA (cerebral vascular accident) [facility]) with intracranial vascular stenosis 06/29/2021   Intracranial vascular stenosis 06/29/2021   Prediabetes 06/29/2021   Normocytic anemia 06/29/2021   History of stroke 06/29/2021   Other drug-induced neutropenia (HCC) 06/09/2021   Malignant neoplasm of ascending colon (HCC) 06/09/2021   Stenosis of artery (HCC) 06/09/2021   Preventative health care 03/01/2021   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   Kidney disease, chronic, stage III (moderate, EGFR 30-59 ml/min) (HCC) 08/07/2020   Leukopenia 08/07/2020   Acute respiratory failure with hypoxia (HCC) 08/07/2020   SIRS (systemic inflammatory response syndrome) (HCC) 08/07/2020   Methotrexate adverse reaction, initial encounter    Acute pain of right shoulder 04/14/2020   Lower extremity edema 11/13/2019   Head trauma 11/13/2019   Dyslipidemia 11/13/2019   Memory loss 07/28/2019   Chronic right shoulder pain 07/28/2019   Frequent falls 07/28/2019   Balance problem 07/28/2019   Dysphagia 07/28/2019   Traumatic hemorrhage of cerebrum, unspecified, without loss of consciousness, initial encounter (HCC)    Basilar artery  stenosis with infarction (HCC) 12/18/2017   TIA (transient ischemic attack) 12/10/2017   Rheumatoid arthritis (HCC)    Chest pain 06/02/2011   General medical examination 04/11/2011   PEPTIC ULCER DISEASE, HELICOBACTER PYLORI POSITIVE 07/12/2010   PERSONAL HISTORY MALIG NEOPLASM LARGE INTESTINE 05/18/2010   MITRAL VALVE DISORDERS 04/05/2009   HOARSENESS 12/04/2008   OSTEOPENIA 08/26/2008   HYPERTRIGLYCERIDEMIA 07/23/2008   EPISTAXIS 07/26/2007   ERECTILE DYSFUNCTION 02/22/2007   ECZEMA 12/07/2006   LEG CRAMPS 10/09/2006   Hypothyroidism 08/28/2006   Essential hypertension 08/28/2006   HAY FEVER 08/28/2006   INSOMNIA 08/28/2006     ONSET DATE: 07/07/2021  REFERRING DIAG: Z61.09  THERAPY DIAG:  Cerebrovascular accident (CVA) due to thrombosis of cerebral artery (HCC)  Other abnormalities of gait and mobility  Muscle weakness (generalized)  Unspecified lack of coordination  Rationale for Evaluation and Treatment Rehabilitation  SUBJECTIVE:                                                                                                                                                                                              SUBJECTIVE STATEMENT:  Patient is moving better today, caretaker expresses that she has been doing exercises with him every day at home. He has not had any falls and is not having pain upon entering.   Pt accompanied by: caregiver  PERTINENT HISTORY: colon surgery, head inury 11/2017, requires assistance w/ADLs, bronchitis 10/11/21, L radial nerve palsy 08/2021, hx of stroke and CVA, stage 3 kidney disease, memory loss, frequent falls (the most recent being 08/27/21 and he hurt his shoulder)  PAIN:  Are you having pain? No  PRECAUTIONS: Fall  WEIGHT BEARING RESTRICTIONS No  FALLS: Has patient fallen in last 6 months? Yes. Number of falls multiple and patient denied that he had falls but chart review says otherwise  LIVING ENVIRONMENT: Lives with: lives with their family Lives in: House/apartment Stairs: Yes: Internal: 12 steps; bilateral but cannot reach both Has following equipment at home: Walker - 2 wheeled  PLOF: Needs assistance with ADLs and Needs assistance with gait  PATIENT GOALS walk with walker no assistance  OBJECTIVE:   COGNITION: Overall cognitive status: Impaired: Memory: Impaired: Working Teacher, music term Long term Adult nurse and History of cognitive impairments - at baseline Hx of intermittent confusion, unable to answer questions with full understanding   LOWER EXTREMITY ROM:   no objective measurements taken, but noticeable hip tightness with gait, and IR/ER  tightness when tested in wheelchair  Active  Right Eval Left Eval  Hip flexion    Hip extension    Hip abduction    Hip adduction  Hip internal rotation    Hip external rotation    Knee flexion    Knee extension    Ankle dorsiflexion    Ankle plantarflexion    Ankle inversion    Ankle eversion     (Blank rows = not tested)  LOWER EXTREMITY MMT:    MMT Right Eval Left Eval  Hip flexion 4- 4-  Hip extension    Hip abduction 3+ 3+  Hip adduction 4 4  Hip internal rotation    Hip external rotation    Knee flexion 4- 4-  Knee extension 4- 4-  Ankle dorsiflexion 4 4  Ankle plantarflexion 4 4  Ankle inversion    Ankle eversion    (Blank rows = not tested)  UPPER EXTREMITY ROM:     Active  Right Eval Left Eval  Shoulder flexion 125 110  Shoulder extension    Shoulder abduction 73 90  Shoulder adduction    Shoulder IR    Shoulder ER [facility] WFL  Elbow flexion WNL WNL  Elbow extension WNL WNL   (Blank rows = not tested)  UPPER EXTREMITY MMT:     MMT Right Eval Left Eval  Shoulder flexion 3 3  Shoulder extension    Shoulder abduction 2+ 2+  Shoulder adduction    Shoulder IR 3+ 3+  Shoulder ER 3+ 3+  Elbow flexion 4- 4-  Elbow extension 4- 4-  (Blank rows = not tested)  BED MOBILITY:  TBD  TRANSFERS: Assistive device utilized: Environmental consultant - 2 wheeled and Wheelchair (manual)  Sit to stand: Mod A Stand to sit: Mod A Patient has a tendency to push backwards with transfers  GAIT: Gait pattern: step to pattern, decreased step length- Right, decreased step length- Left, decreased stride length, shuffling, ataxic, trendelenburg, decreased trunk rotation, trunk flexed, and wide BOS Distance walked: 97ft Assistive device utilized: Walker - 2 wheeled Level of assistance: Mod A Comments: modA to stand from w/c and walk 58ft, unsteadiness on feet especially with turns, slow gait, wide BOS  FUNCTIONAL TESTs:  5 times sit to stand: TBD (unable to do w/o  modA) Timed up and go (TUG): 30sec w/modA-maxA   TODAY'S TREATMENT:  11/15/21 Nustep L3 x41mins TB HS curls 2x12 Standing heel taps on 6" 20reps Forward and side stepping over obstacles in //  Mini squats 2x10 Walking with quad cane   Standing ball toss 20 reps  OHP with red ball 2x10   11/07/21 Bike L2, 5 mins  LAQ 3# 2x10   TB hamstring curls seated 2x10   Banded ankle DF 2x10  Ladder drill for big steps forwards and sideways [facility]   11/03/21 Nustep L2, STS pushing from arm rest 2x10 Walking with cane 1 lap inside HHA side steps  Seated rows w/green band 2x12   LAQ 3# 2x10   11/01/21 Standing marches 2#  Standing abduction 2# Side stepping in parallel bars Walking with one HHA in parallel bars Walking w/quad cane 48ft w/minA Standing shoulder flexion with red ball 2x10  10/27/21: Nustep Level 3 x 5 minutes Standing 2# marching and hip abduction 2x10 int he Pbars Standing ball toss with SBA Side stepping and backward walking HHA walking 100' x2 with w/c follow, working on speed and step length     PATIENT EDUCATION: Education details: POC  Person educated: Patient and Journalist, newspaper method: Programmer, multimedia, Facilities manager, Actor cues, and Verbal cues Education comprehension: returned demonstration, verbal cues required, tactile cues required, and needs further education  HOME EXERCISE PROGRAM: Access Code: [email or web address]    GOALS: Goals reviewed with patient? Yes  SHORT TERM GOALS: Target date: 11/30/21  Patient will complete HEP Baseline: Goal status: INITIAL    LONG TERM GOALS: Target date: 01/11/22  Patient will demonstrate 4/5 strength in UE to be able to complete ADLs with minA Baseline:  Goal status: INITIAL  2.  Patient will ambulate 70ft with minA or better to be able to navigate inside home Baseline:  Goal status: INITIAL  3.  Patient will demonstrate >= 4/5 strength in LE to be able to complete transfers with less  assistance and become household ambulator Baseline:  Goal status: INITIAL  4.  Patient will score <75min on TUG using rolling walker and minA Baseline:  Goal status: INITIAL   ASSESSMENT:  CLINICAL IMPRESSION:  Patient is showing good progress, moving faster and taking bigger steps today. I was pleasantly surprised with his ability to do mini squats with the depth and control in // bars. He completed all exercises today with more ease and control. He states he is walking around his house more and takes at least 4 laps around his table. He is taking good steps with cues, still working on side stepping and presents with some difficulty going to the left due to weakness and L foot ER.   OBJECTIVE IMPAIRMENTS Abnormal gait, decreased activity tolerance, decreased balance, decreased cognition, decreased coordination, decreased knowledge of condition, decreased mobility, difficulty walking, decreased ROM, decreased strength, and decreased safety awareness.   ACTIVITY LIMITATIONS sitting, standing, transfers, bed mobility, bathing, toileting, dressing, and locomotion level  PARTICIPATION LIMITATIONS: cleaning, interpersonal relationship, community activity, and home maintenance  PERSONAL FACTORS Age, Behavior pattern, and 3+ comorbidities: frequent falls, hx of strokes, memory loss  are also affecting patient's functional outcome.   REHAB POTENTIAL: Fair age, low functional level, and cognitive factors may cause difficulty with treatment  CLINICAL DECISION MAKING: Evolving/moderate complexity  EVALUATION COMPLEXITY: Moderate  PLAN: PT FREQUENCY: 2x/week  PT DURATION: 12 weeks  PLANNED INTERVENTIONS: Therapeutic exercises, Therapeutic activity, Neuromuscular re-education, Balance training, Gait training, Patient/Family education, Joint mobilization, Stair training, Wheelchair mobility training, and Re-evaluation  PLAN FOR NEXT SESSION: continue with gait training and LE strengthening    Cassie Freer, PT 11/15/2021, 11:49 AM

## 2021-11-16 NOTE — Chronic Care Management (AMB) (Signed)
Chronic Care Management    Clinical Social Work Note  11/16/2021 Name: [name] MRN: [id number] DOB: 08/30/1928  [name] is a 88 y.o. year old male who is a primary care patient of Lowne Chase, Yvonne R, DO. The CCM team was consulted to assist the patient with chronic disease management and/or care coordination needs related to: Community Resources, Level of Care Concerns, and Caregiver Stress.   Engaged with patient's daughter by telephone for initial visit in response to provider referral for social work chronic care management and care coordination services.   Consent to Services:  The patient was given the following information about Chronic Care Management services today, agreed to services, and gave verbal consent: 1. CCM service includes personalized support from designated clinical staff supervised by the primary care provider, including individualized plan of care and coordination with other care providers 2. 24/7 contact phone numbers for assistance for urgent and routine care needs. 3. Service will only be billed when office clinical staff spend 20 minutes or more in a month to coordinate care. 4. Only one practitioner may furnish and bill the service in a calendar month. 5.The patient may stop CCM services at any time (effective at the end of the month) by phone call to the office staff. 6. The patient will be responsible for cost sharing (co-pay) of up to 20% of the service fee (after annual deductible is met). Patient agreed to services and consent obtained.  Patient agreed to services and consent obtained.   Assessment: Review of patient past medical history, allergies, medications, and health status, including review of relevant consultants reports was performed today as part of a comprehensive evaluation and provision of chronic care management and care coordination services.     SDOH (Social Determinants of Health) assessments and interventions performed:  SDOH Interventions     Flowsheet Row Most Recent Value  SDOH Interventions   Food Insecurity Interventions Intervention Not Indicated, Other (Comment)  [Verified by Daughter, Betty Hutchinson]  Financial Strain Interventions Intervention Not Indicated, Other (Comment)  [Verified by Daughter, Betty Hutchinson]  Housing Interventions Intervention Not Indicated, Other (Comment)  [Verified by Daughter, Betty Hutchinson]  Physical Activity Interventions Intervention Not Indicated, Other (Comments)  [Currently receiving home health physical therapy twice a week]  Stress Interventions Intervention Not Indicated, Offered Community Wellness Resources, Other (Comment)  [Verified by Daughter, Betty Hutchinson]  Social Connections Interventions Intervention Not Indicated, Other (Comment)  [Verified by Daughter, Betty Hutchinson]  Transportation Interventions Intervention Not Indicated, Other (Comment)  [Verified by Daughter, Betty Hutchinson]        Advanced Directives Status: See Care Plan for related entries.  CCM Care Plan  Allergies  Allergen Reactions   Methotrexate Derivatives Other (See Comments)    Mouth sores / pancytopenia   Asa [Aspirin] Other (See Comments)    Gastric symptoms - can tolerate 81 mg aspirin   Penicillin G Rash    Did it involve swelling of the face/tongue/throat, SOB, or low BP? Unknown Did it involve sudden or severe rash/hives, skin peeling, or any reaction on the inside of your mouth or nose? Unknown Did you need to seek medical attention at a hospital or doctor's office? Unknown When did it last happen?   unknown    If all above answers are "NO", may proceed with cephalosporin use.    Outpatient Encounter Medications as of 11/15/2021  Medication Sig   acetaminophen (TYLENOL) 500 MG tablet Take 1 tablet (500 mg total) by mouth at bedtime.     aspirin EC 325 MG tablet TAKE 1 TABLET BY MOUTH EVERY DAY   atorvastatin (LIPITOR) 40 MG tablet Take 1 tablet (40 mg total) by mouth daily.    capsicum (ZOSTRIX) 0.075 % topical cream Apply 1 application. topically 4 (four) times daily -  with meals and at bedtime. Be sure to wear glove and do not touch eye, mouth or nose (any mucous membranes) Apply to left shoulder.   clopidogrel (PLAVIX) 75 MG tablet Take 1 tablet (75 mg total) by mouth daily. Please call office at [phone] to schedule appt with Dr. Leonie Man for continued refills   docusate sodium (COLACE) 100 MG capsule Take 2 capsules (200 mg total) by mouth daily.   doxazosin (CARDURA) 1 MG tablet Take 1 tablet (1 mg total) by mouth at bedtime.   levothyroxine (SYNTHROID) 100 MCG tablet Take 1 tablet (100 mcg total) by mouth daily before breakfast.   mirabegron ER (MYRBETRIQ) 25 MG TB24 tablet Take 25 mg by mouth at bedtime.   Multiple Vitamins-Minerals (MULTIVITAMIN ADULT) CHEW Chew 2 each by mouth daily.   NONFORMULARY OR COMPOUNDED ITEM 4 wheeled walker   #1  dx frequent falls, weakness   No facility-administered encounter medications on file as of 11/15/2021.    Patient Active Problem List   Diagnosis Date Noted   Requires assistance with activities of daily living (ADL) 10/11/2021   Bronchitis 10/11/2021   Trapezium fracture 09/09/2021   CMC arthritis, thumb, degenerative 09/09/2021   Left wrist pain 09/05/2021   Radial nerve palsy, left 09/05/2021   Acute shoulder pain due to trauma, left 07/29/2021   Slow transit constipation    Stroke (cerebrum) ([place]) 07/07/2021   CVA (cerebral vascular accident) [facility]) with intracranial vascular stenosis 06/29/2021   Intracranial vascular stenosis 06/29/2021   Prediabetes 06/29/2021   Normocytic anemia 06/29/2021   History of stroke 06/29/2021   Other drug-induced neutropenia ([place]) 06/09/2021   Malignant neoplasm of ascending colon ([place]) 06/09/2021   Stenosis of artery ([place]) 06/09/2021   Preventative health care 03/01/2021   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   Kidney disease, chronic, stage III (moderate, EGFR 30-59 ml/min)  ([place]) 08/07/2020   Leukopenia 08/07/2020   Acute respiratory failure with hypoxia ([place]) 08/07/2020   SIRS (systemic inflammatory response syndrome) ([place]) 08/07/2020   Methotrexate adverse reaction, initial encounter    Acute pain of right shoulder 04/14/2020   Lower extremity edema 11/13/2019   Head trauma 11/13/2019   Dyslipidemia 11/13/2019   Memory loss 07/28/2019   Chronic right shoulder pain 07/28/2019   Frequent falls 07/28/2019   Balance problem 07/28/2019   Dysphagia 07/28/2019   Traumatic hemorrhage of cerebrum, unspecified, without loss of consciousness, initial encounter ([place])    Basilar artery stenosis with infarction ([place]) 12/18/2017   TIA (transient ischemic attack) 12/10/2017   Rheumatoid arthritis ([place])    Chest pain 06/02/2011   General medical examination 04/11/2011   PEPTIC ULCER DISEASE, HELICOBACTER PYLORI POSITIVE 07/12/2010   PERSONAL HISTORY MALIG NEOPLASM LARGE INTESTINE 05/18/2010   MITRAL VALVE DISORDERS 04/05/2009   HOARSENESS 12/04/2008   OSTEOPENIA 08/26/2008   HYPERTRIGLYCERIDEMIA 07/23/2008   EPISTAXIS 07/26/2007   ERECTILE DYSFUNCTION 02/22/2007   ECZEMA 12/07/2006   LEG CRAMPS 10/09/2006   Hypothyroidism 08/28/2006   Essential hypertension 08/28/2006   HAY FEVER 08/28/2006   INSOMNIA 08/28/2006    Conditions to be addressed/monitored: Hypertension, Hypothyroidism, History of TIA, Mixed Hyperlipidemia, CKD, Osteopenia, Anemia, and Memory Loss.  Limited Social Support, Level of Care Concerns, ADL/IADL Limitations, Social Isolation, Limited Access to  Caregiver, Cognitive Deficits, Memory Deficits, and Lacks Knowledge of Community Resources.  Care Plan : LCSW Plan of Care  Updates made by ,  D, LCSW since 11/16/2021 12:00 AM     Problem: Enroll in an Adult Day Care Program of Choice.   Priority: High     Goal: Enroll in an Adult Day Care Program of Choice.   Start Date: 11/15/2021  Expected End Date: 02/15/2022  This Visit's  Progress: On track  Priority: High  Note:   Current Barriers:   Patient with Hypertension, Hypothyroidism, History of TIA, Mixed Hyperlipidemia, CKD, Osteopenia, Anemia, and Memory Loss, needs Support, Education, Resources, Referrals, Advocacy, and Care Coordination, to resolve unmet personal care needs in the home. Patient is unable to self-administer medications as prescribed, or consistently perform ADL's/IADL's independently. Patient lacks knowledge of available community agencies and resources. Clinical Goals:  Patient will enroll in an Adult Day Care Program, as well as receive caregiver services through a Respite Care Agency and/or facility.    Patient will demonstrate improved health management independence, as evidenced by enrollment in an Adult Day Care Program, and by receiving caregiver services through a Respite Care Agency and/or facility. Interventions: Patient's daughter interviewed and appropriate assessments performed. Collaboration with Primary Care Provider, Dr. Yvonne Lowne Chase regarding development and update of comprehensive plan of care, as evidenced by provider attestation and co-signature. Inter-disciplinary care team collaboration (see longitudinal plan of care). Identified resources and durable medical equipment needed in the home to improve safety and promote independence.  Clinical Interventions: Problem Solving/Task Centered Solutions Developed. Quality of Sleep Assessed and Sleep Hygiene Techniques Promoted. Caregiver Stress Acknowledged. Consideration of In-Home Care Services Encouraged. Patient's daughter will receive and review Discussed plans with patient's daughter for ongoing care management follow-up, and provided direct contact information for care management team. Assisted patient's daughter with obtaining information about health plan benefits through [employer], and confirmed no Veterans Administration benefits or long-term care  insurance policy.   Provided education to patient's daughter regarding level of care options (I.e Assisted Living, Memory Care, Respite Care), but daughter would like for patient to remain in the home for as long as possible. Assessed needs, level of care concerns, basic eligibility, and provided education on Adult Day Care Programs and Respite Care Agencies and Facilities.   Patient Goals/Self-Care Activities:  Daughter will work with LCSW on a bi-weekly basis, in an effort to enroll you in an Adult Day Care Program, as well as receive caregiver services through a Respite Care Agency and/or facility.     Daughter will receive the following list of resources, emailed to her on 11/15/2021 by LCSW, and begin contacting agencies and facilities of interest:  ~ Adult Day Care Programs  ~ Respite Care Agencies and Facilities Daughter encouraged to contact LCSW (# [phone]) directly, if she has questions, needs assistance, or if additional social work needs are identified between now and our next scheduled telephone outreach call. Follow-Up Plan: LCSW will follow-up with patient's daughter by telephone on 11/23/2021 at 9:15 am     LCSW Licensed Clinical Social Worker [facility] [phone]   

## 2021-11-16 NOTE — Patient Instructions (Signed)
Visit Information   Thank you for taking time to visit with me today. Please don't hesitate to contact me if I can be of assistance to you before our next scheduled telephone appointment.  Following are the goals we discussed today:  Patient Goals/Self-Care Activities:  Daughter will work with LCSW on a bi-weekly basis, in an effort to enroll you in an Adult Day Care Program, as well as receive caregiver services through a [place] and/or facility.     Daughter will receive the following list of resources, emailed to her on 11/15/2021 by LCSW, and begin contacting agencies and facilities of interest:  ~ Adult Day Care Programs  ~ [place] and Facilities Daughter encouraged to contact LCSW [phone]) directly, if she has questions, needs assistance, or if additional social work needs are identified between now and our next scheduled telephone outreach call. Follow-Up Plan: LCSW will follow-up with patient's daughter by telephone on 11/23/2021 at 9:15 am  Please call the care guide team at [phone] if you need to cancel or reschedule your appointment.   If you are experiencing a Mental Health or [place] or need someone to talk to, please call the Suicide and Crisis Lifeline: 988 call the [place] National Suicide Prevention Lifeline: 909-825-8507 or TTY: 269-813-6748 TTY 343-138-4632) to talk to a trained counselor call 1-800-273-TALK (toll free, 24 hour hotline) go to [facility] [place] [phone]) call the [facility]: [phone] call 911   Following is a copy of your full care plan:  Care Plan : LCSW Plan of Care  Updates made by Francis Gaines, LCSW since 11/16/2021 12:00 AM     Problem: Enroll in an Adult Day Care Program of Choice.   Priority: High     Goal: Enroll in an Adult Day Care Program of Choice.   Start Date: 11/15/2021  Expected End Date:  02/15/2022  This Visit's Progress: On track  Priority: High  Note:   Current Barriers:   Patient with Hypertension, Hypothyroidism, History of TIA, Mixed Hyperlipidemia, CKD, Osteopenia, Anemia, and Memory Loss, needs Support, Education, Resources, Referrals, Advocacy, and Care Coordination, to resolve unmet personal care needs in the home. Patient is unable to self-administer medications as prescribed, or consistently perform ADL's/IADL's independently. Patient lacks knowledge of available community agencies and resources. Clinical Goals:  Patient will enroll in an Adult Day Care Program, as well as receive caregiver services through a [place] and/or facility.    Patient will demonstrate improved health management independence, as evidenced by enrollment in an Adult Day Care Program, and by receiving caregiver services through a [place] and/or facility. Interventions: Patient's daughter interviewed and appropriate assessments performed. Collaboration with Primary Care Provider, Dr. [name] regarding development and update of comprehensive plan of care, as evidenced by provider attestation and co-signature. Inter-disciplinary care team collaboration (see longitudinal plan of care). Identified resources and durable medical equipment needed in the home to improve safety and promote independence.  Clinical Interventions: Problem Solving/Task Centered Solutions Developed. Quality of Sleep Assessed and Sleep Hygiene Techniques Promoted. Caregiver Stress Acknowledged. Consideration of In-Home Care Services Encouraged. Patient's daughter will receive and review Discussed plans with patient's daughter for ongoing care management follow-up, and provided direct contact information for care management team. Assisted patient's daughter with obtaining information about health plan benefits through [employer], and confirmed no [employer] or long-term care Set designer.  Provided education to patient's daughter regarding level of care options (I.e Assisted Living, Memory Care, Respite Care), but daughter would like for patient to remain in the home for as long as possible. Assessed needs, level of care concerns, basic eligibility, and provided education on Adult Day Care Programs and [place] and Facilities.   Patient Goals/Self-Care Activities:  Daughter will work with LCSW on a bi-weekly basis, in an effort to enroll you in an Adult Day Care Program, as well as receive caregiver services through a [place] and/or facility.     Daughter will receive the following list of resources, emailed to her on 11/15/2021 by LCSW, and begin contacting agencies and facilities of interest:  ~ Adult Day Care Programs  ~ [place] and Facilities Daughter encouraged to contact LCSW [phone]) directly, if she has questions, needs assistance, or if additional social work needs are identified between now and our next scheduled telephone outreach call. Follow-Up Plan: LCSW will follow-up with patient's daughter by telephone on 11/23/2021 at 9:15 am     Consent to CCM Services: Mr. Strader was given information about Chronic Care Management services including:  CCM service includes personalized support from designated clinical staff supervised by his physician, including individualized plan of care and coordination with other care providers 24/7 contact phone numbers for assistance for urgent and routine care needs. Service will only be billed when office clinical staff spend 20 minutes or more in a month to coordinate care. Only one practitioner may furnish and bill the service in a calendar month. The patient may stop CCM services at any time (effective at the end of the month) by phone call to the office staff. The patient will be responsible for cost sharing (co-pay) of up to 20% of the service  fee (after annual deductible is met).  Patient agreed to services and verbal consent obtained.   Patient verbalizes understanding of instructions and care plan provided today and agrees to view in [place]. Active MyChart status and patient understanding of how to access instructions and care plan via MyChart confirmed with patient.     Telephone follow up appointment with care management team member scheduled for:  11/23/2021 at 9:15 am.  Nat Christen LCSW Licensed Clinical Social Worker [place] [phone]

## 2021-11-17 ENCOUNTER — Encounter: Payer: Self-pay | Admitting: Physical Therapy

## 2021-11-17 ENCOUNTER — Ambulatory Visit: Payer: Medicare Other | Admitting: Physical Therapy

## 2021-11-17 DIAGNOSIS — R2689 Other abnormalities of gait and mobility: Secondary | ICD-10-CM | POA: Diagnosis not present

## 2021-11-17 DIAGNOSIS — M6281 Muscle weakness (generalized): Secondary | ICD-10-CM | POA: Diagnosis not present

## 2021-11-17 DIAGNOSIS — R279 Unspecified lack of coordination: Secondary | ICD-10-CM | POA: Diagnosis not present

## 2021-11-17 DIAGNOSIS — I6330 Cerebral infarction due to thrombosis of unspecified cerebral artery: Secondary | ICD-10-CM | POA: Diagnosis not present

## 2021-11-17 NOTE — Therapy (Signed)
OUTPATIENT PHYSICAL THERAPY TREATMENT   Patient Name: [name] MRN: [id number] DOB:1928/09/23, 88 y.o., male Today's Date: 11/17/2021   PCP: [name] REFERRING PROVIDER: [name]   PT End of Session - 11/17/21 1145     Visit Number 8    Date for PT Re-Evaluation 01/11/22    PT Start Time 1103    PT Stop Time 1141    PT Time Calculation (min) 38 min    Equipment Utilized During Treatment Gait belt    Activity Tolerance Patient tolerated treatment well    Behavior During Therapy WFL for tasks assessed/performed                  Past Medical History:  Diagnosis Date   Adenocarcinoma ([place]) 1985   colon s/p right hemicolectomy and chemotherapy. F/U with cancer center and Dr. Lucia Gaskins rotinely   Depression    Eczema    Epistaxis    f/u per ENT   Erectile dysfunction    Gastric ulcer 12/11   H-Pylori Tx, EGD 3-12: gastritis   Hay fever    Head injury 11/2017   with fall   Hyperlipemia    Hypertension    Hypothyroidism    Hypothyroid   Insomnia    Osteopenia     per DEXA 07-2008 (Rx fosamax)   Rheumatoid arthritis ([place])    Stroke ([place])     " balance issue"   Past Surgical History:  Procedure Laterality Date   COLON SURGERY     Right / Adenocarcinoma / Chemo   COLONOSCOPY  2005, 2008   Dr. Lucia Gaskins   INGUINAL HERNIA REPAIR     Right   IR ANGIO INTRA EXTRACRAN SEL COM CAROTID INNOMINATE BILAT MOD SED  12/13/2017   IR ANGIO VERTEBRAL SEL VERTEBRAL BILAT MOD SED  12/13/2017   IR PTA INTRACRANIAL  12/18/2017   RADIOLOGY WITH ANESTHESIA N/A 12/18/2017   Procedure: Angioplasty with stenting;  Surgeon: Luanne Bras, MD;  Location: [facility];  Service: Radiology;  Laterality: N/A;   Patient Active Problem List   Diagnosis Date Noted   Requires assistance with activities of daily living (ADL) 10/11/2021   Bronchitis 10/11/2021   Trapezium fracture 09/09/2021   CMC arthritis, thumb, degenerative 09/09/2021   Left wrist pain 09/05/2021    Radial nerve palsy, left 09/05/2021   Acute shoulder pain due to trauma, left 07/29/2021   Slow transit constipation    Stroke (cerebrum) ([place]) 07/07/2021   CVA (cerebral vascular accident) [facility]) with intracranial vascular stenosis 06/29/2021   Intracranial vascular stenosis 06/29/2021   Prediabetes 06/29/2021   Normocytic anemia 06/29/2021   History of stroke 06/29/2021   Other drug-induced neutropenia ([place]) 06/09/2021   Malignant neoplasm of ascending colon ([employer]) 06/09/2021   Stenosis of artery ([place]) 06/09/2021   Preventative health care 03/01/2021   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   Kidney disease, chronic, stage III (moderate, EGFR 30-59 ml/min) ([place]) 08/07/2020   Leukopenia 08/07/2020   Acute respiratory failure with hypoxia ([place]) 08/07/2020   SIRS (systemic inflammatory response syndrome) ([place]) 08/07/2020   Methotrexate adverse reaction, initial encounter    Acute pain of right shoulder 04/14/2020   Lower extremity edema 11/13/2019   Head trauma 11/13/2019   Dyslipidemia 11/13/2019   Memory loss 07/28/2019   Chronic right shoulder pain 07/28/2019   Frequent falls 07/28/2019   Balance problem 07/28/2019   Dysphagia 07/28/2019   Traumatic hemorrhage of cerebrum, unspecified, without loss of consciousness, initial encounter ([place])    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   TIA (transient ischemic attack) 12/10/2017   Rheumatoid arthritis ([place])    Chest pain 06/02/2011   General medical examination 04/11/2011   PEPTIC ULCER DISEASE, HELICOBACTER PYLORI POSITIVE 07/12/2010   PERSONAL HISTORY MALIG NEOPLASM LARGE INTESTINE 05/18/2010   MITRAL VALVE DISORDERS 04/05/2009   HOARSENESS 12/04/2008   OSTEOPENIA 08/26/2008   HYPERTRIGLYCERIDEMIA 07/23/2008   EPISTAXIS 07/26/2007   ERECTILE DYSFUNCTION 02/22/2007   ECZEMA 12/07/2006   LEG CRAMPS 10/09/2006   Hypothyroidism 08/28/2006   Essential hypertension 08/28/2006   HAY FEVER 08/28/2006   INSOMNIA  08/28/2006    ONSET DATE: 07/07/2021  REFERRING DIAG: D53.29  THERAPY DIAG:  Cerebrovascular accident (CVA) due to thrombosis of cerebral artery ([place])  Other abnormalities of gait and mobility  Muscle weakness (generalized)  Unspecified lack of coordination  Rationale for Evaluation and Treatment Rehabilitation  SUBJECTIVE:                                                                                                                                                                                              SUBJECTIVE STATEMENT:  Patient reports no issues today  Pt accompanied by: interpreter  PERTINENT HISTORY: colon surgery, head inury 11/2017, requires assistance w/ADLs, bronchitis 10/11/21, L radial nerve palsy 08/2021, hx of stroke and CVA, stage 3 kidney disease, memory loss, frequent falls (the most recent being 08/27/21 and he hurt his shoulder)  PAIN:  Are you having pain? No  PRECAUTIONS: Fall  WEIGHT BEARING RESTRICTIONS No  FALLS: Has patient fallen in last 6 months? Yes. 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, historic buildings and History of cognitive impairments - at baseline Hx of intermittent confusion, unable to answer questions with full understanding   LOWER EXTREMITY ROM:   no objective measurements taken, but noticeable hip tightness with gait, and IR/ER tightness when tested in wheelchair  Active  Right Eval Left Eval  Hip flexion    Hip extension    Hip abduction    Hip  adduction    Hip internal rotation    Hip external rotation    Knee flexion    Knee extension    Ankle dorsiflexion  Ankle plantarflexion    Ankle inversion    Ankle eversion     (Blank rows = not tested)  LOWER EXTREMITY MMT:    MMT Right Eval Left Eval  Hip flexion 4- 4-  Hip extension    Hip abduction 3+ 3+  Hip adduction 4 4  Hip internal rotation    Hip external rotation    Knee flexion 4- 4-  Knee extension 4- 4-  Ankle dorsiflexion 4 4  Ankle plantarflexion 4 4  Ankle inversion    Ankle eversion    (Blank rows = not tested)  UPPER EXTREMITY ROM:     Active  Right Eval Left Eval  Shoulder flexion 125 110  Shoulder extension    Shoulder abduction 73 90  Shoulder adduction    Shoulder IR    Shoulder ER [facility] WFL  Elbow flexion WNL WNL  Elbow extension WNL WNL   (Blank rows = not tested)  UPPER EXTREMITY MMT:     MMT Right Eval Left Eval  Shoulder flexion 3 3  Shoulder extension    Shoulder abduction 2+ 2+  Shoulder adduction    Shoulder IR 3+ 3+  Shoulder ER 3+ 3+  Elbow flexion 4- 4-  Elbow extension 4- 4-  (Blank rows = not tested)  BED MOBILITY:  TBD  TRANSFERS: Assistive device utilized: Environmental consultant - 2 wheeled and Wheelchair (manual)  Sit to stand: Mod A Stand to sit: Mod A Patient has a tendency to push backwards with transfers  GAIT: Gait pattern: step to pattern, decreased step length- Right, decreased step length- Left, decreased stride length, shuffling, ataxic, trendelenburg, decreased trunk rotation, trunk flexed, and wide BOS Distance walked: 3f Assistive device utilized: Walker - 2 wheeled Level of assistance: Mod A Comments: modA to stand from w/c and walk 151f unsteadiness on feet especially with turns, slow gait, wide BOS  FUNCTIONAL TESTs:  5 times sit to stand: TBD (unable to do w/o modA) Timed up and go (TUG): 88m55m30sec w/modA-maxA   TODAY'S TREATMENT:   11/17/21 Ambulation with RW in assessment- patietn tends  to flex forward, uses excessive hip flexion to clear feet compensating due to previously dragging feet. 80', CGA, stable. Practiced forward weiht hsifts progressing ot sit to stand from mat with hands clasped out in front to prevent posterior lean, S Standing step back into outward rotation, then weight shift back to place object on mat, 6 reps to each side, CGA. Ambulation while holding 1# ball in BUE to encourage upright posture, 1 x 70', VC to take longer steps and maintain upright posture. 4 square stepping to encourage improved balance and ability to change/wt shift in all directions. CGA 11/15/21 Nustep L3 [id number] HS curls 2x12 Standing heel taps on 6" 20reps Forward and side stepping over obstacles in //  Mini squats 2x10 Walking with quad cane   Standing ball toss 20 reps  OHP with red ball 2x10   11/07/21 Bike L2, 5 mins  LAQ 3# 2x10   TB hamstring curls seated 2x10   Banded ankle DF 2x10  Ladder drill for big steps forwards and [facility]23 Nustep L2, 5min63mTS pushing from arm rest 2x10 Walking with cane 1 lap inside HHA side steps  Seated rows w/green band 2x12   LAQ 3# 2x10   11/01/21 Standing marches 2#  Standing abduction 2# Side stepping in parallel bars Walking with one HHA in parallel bars Walking w/quad cane 50ft 41fnA Standing shoulder flexion with  red ball 2x10  10/27/21: Nustep Level 3 x 5 minutes Standing 2# marching and hip abduction 2x10 int he Pbars Standing ball toss with SBA Side stepping and backward walking HHA walking 100' x2 with w/c follow, working on speed and step length     PATIENT EDUCATION: Education details: POC  Person educated: Patient and Armed forces training and education officer method: Consulting civil engineer, Media planner, Corporate treasurer cues, and Verbal cues Education comprehension: returned demonstration, verbal cues required, tactile cues required, and needs further education   HOME EXERCISE PROGRAM: Access Code:  [email or web address]    GOALS: Goals reviewed with patient? Yes  SHORT TERM GOALS: Target date: 11/30/21  Patient will complete HEP Baseline: Goal status: ongoing    LONG TERM GOALS: Target date: 01/11/22  Patient will demonstrate 4/5 strength in UE to be able to complete ADLs with minA Baseline:  Goal status: INITIAL  2.  Patient will ambulate 23f with minA or better to be able to navigate inside home Baseline:  Goal status: INITIAL  3.  Patient will demonstrate >= 4/5 strength in LE to be able to complete transfers with less assistance and become household ambulator Baseline:  Goal status: INITIAL  4.  Patient will score <155m on TUG using rolling walker and minA Baseline:  Goal status: INITIAL   ASSESSMENT:  CLINICAL IMPRESSION:  Patient demonstrates increased stiffness throughout trunk, age appropriate. Treatment addressed improved forward weight shift into sit to stand, as well as standing balance and weight shifts over BOS in stand to improve balance, and gait training.  OBJECTIVE IMPAIRMENTS Abnormal gait, decreased activity tolerance, decreased balance, decreased cognition, decreased coordination, decreased knowledge of condition, decreased mobility, difficulty walking, decreased ROM, decreased strength, and decreased safety awareness.   ACTIVITY LIMITATIONS sitting, standing, transfers, bed mobility, bathing, toileting, dressing, and locomotion level  PARTICIPATION LIMITATIONS: cleaning, interpersonal relationship, community activity, and home maintenance  PERSONAL FACTORS Age, Behavior pattern, and 3+ comorbidities: frequent falls, hx of strokes, memory loss  are also affecting patient's functional outcome.   REHAB POTENTIAL: Fair age, low functional level, and cognitive factors may cause difficulty with treatment  CLINICAL DECISION MAKING: Evolving/moderate complexity  EVALUATION COMPLEXITY: Moderate  PLAN: PT FREQUENCY: 2x/week  PT DURATION: 12  weeks  PLANNED INTERVENTIONS: Therapeutic exercises, Therapeutic activity, Neuromuscular re-education, Balance training, Gait training, Patient/Family education, Joint mobilization, Stair training, Wheelchair mobility training, and Re-evaluation  PLAN FOR NEXT SESSION: continue with gait training and LE strengthening   SuMarcelina MorelDPT 11/17/2021, 11:46 AM

## 2021-11-18 ENCOUNTER — Other Ambulatory Visit: Payer: Self-pay | Admitting: Family Medicine

## 2021-11-18 ENCOUNTER — Telehealth (HOSPITAL_COMMUNITY): Payer: Self-pay

## 2021-11-18 DIAGNOSIS — R1310 Dysphagia, unspecified: Secondary | ICD-10-CM

## 2021-11-18 DIAGNOSIS — E039 Hypothyroidism, unspecified: Secondary | ICD-10-CM | POA: Diagnosis not present

## 2021-11-18 DIAGNOSIS — M8580 Other specified disorders of bone density and structure, unspecified site: Secondary | ICD-10-CM

## 2021-11-18 DIAGNOSIS — I10 Essential (primary) hypertension: Secondary | ICD-10-CM | POA: Diagnosis not present

## 2021-11-18 DIAGNOSIS — E782 Mixed hyperlipidemia: Secondary | ICD-10-CM | POA: Diagnosis not present

## 2021-11-18 NOTE — Telephone Encounter (Signed)
Attempted to contact patient to schedule OP MBS - left voicemail. ?

## 2021-11-23 ENCOUNTER — Ambulatory Visit (INDEPENDENT_AMBULATORY_CARE_PROVIDER_SITE_OTHER): Payer: Medicare Other | Admitting: *Deleted

## 2021-11-23 DIAGNOSIS — I10 Essential (primary) hypertension: Secondary | ICD-10-CM

## 2021-11-23 DIAGNOSIS — G459 Transient cerebral ischemic attack, unspecified: Secondary | ICD-10-CM

## 2021-11-23 DIAGNOSIS — R413 Other amnesia: Secondary | ICD-10-CM

## 2021-11-23 DIAGNOSIS — R531 Weakness: Secondary | ICD-10-CM

## 2021-11-23 DIAGNOSIS — R296 Repeated falls: Secondary | ICD-10-CM

## 2021-11-23 DIAGNOSIS — R2689 Other abnormalities of gait and mobility: Secondary | ICD-10-CM

## 2021-11-23 DIAGNOSIS — E785 Hyperlipidemia, unspecified: Secondary | ICD-10-CM

## 2021-11-23 DIAGNOSIS — Z8673 Personal history of transient ischemic attack (TIA), and cerebral infarction without residual deficits: Secondary | ICD-10-CM

## 2021-11-23 NOTE — Chronic Care Management (AMB) (Signed)
Chronic Care Management    Clinical Social Work Note  11/23/2021 Name: [name] MRN: [id number] DOB: 1929/05/09  [name] is a 88 y.o. year old male who is a primary care patient of [name], DO. The CCM team was consulted to assist the patient with chronic disease management and/or care coordination needs related to: [employer], Level of Care Concerns, and Caregiver Stress.   Engaged with patient's daughter by telephone for follow-up visit in response to provider referral for social work chronic care management and care coordination services.   Consent to Services:  The patient was given information about Chronic Care Management services, agreed to services, and gave verbal consent prior to initiation of services.  Please see initial visit note for detailed documentation.   Patient agreed to services and consent obtained.   Assessment: Review of patient past medical history, allergies, medications, and health status, including review of relevant consultants reports was performed today as part of a comprehensive evaluation and provision of chronic care management and care coordination services.     SDOH (Social Determinants of Health) assessments and interventions performed:    Advanced Directives Status: Not addressed in this encounter.  CCM Care Plan  Allergies  Allergen Reactions   Methotrexate Derivatives Other (See Comments)    Mouth sores / pancytopenia   Asa [Aspirin] Other (See Comments)    Gastric symptoms - can tolerate 81 mg aspirin   Penicillin G Rash    Did it involve swelling of the face/tongue/throat, SOB, or low BP? Unknown Did it involve sudden or severe rash/hives, skin peeling, or any reaction on the inside of your mouth or nose? Unknown Did you need to seek medical attention at a hospital or doctor's office? Unknown When did it last happen?   unknown    If all above answers are "NO", may proceed with cephalosporin use.    Outpatient  Encounter Medications as of 11/23/2021  Medication Sig   acetaminophen (TYLENOL) 500 MG tablet Take 1 tablet (500 mg total) by mouth at bedtime.   aspirin EC 325 MG tablet TAKE 1 TABLET BY MOUTH EVERY DAY   atorvastatin (LIPITOR) 40 MG tablet Take 1 tablet (40 mg total) by mouth daily.   capsicum (ZOSTRIX) 0.075 % topical cream Apply 1 application. topically 4 (four) times daily -  with meals and at bedtime. Be sure to wear glove and do not touch eye, mouth or nose (any mucous membranes) Apply to left shoulder.   clopidogrel (PLAVIX) 75 MG tablet Take 1 tablet (75 mg total) by mouth daily. Please call office at [phone] to schedule appt with Dr. Leonie Man for continued refills   docusate sodium (COLACE) 100 MG capsule Take 2 capsules (200 mg total) by mouth daily.   doxazosin (CARDURA) 1 MG tablet Take 1 tablet (1 mg total) by mouth at bedtime.   levothyroxine (SYNTHROID) 100 MCG tablet Take 1 tablet (100 mcg total) by mouth daily before breakfast.   mirabegron ER (MYRBETRIQ) 25 MG TB24 tablet Take 25 mg by mouth at bedtime.   Multiple Vitamins-Minerals (MULTIVITAMIN ADULT) CHEW Chew 2 each by mouth daily.   NONFORMULARY OR COMPOUNDED ITEM 4 wheeled walker   #1  dx frequent falls, weakness   No facility-administered encounter medications on file as of 11/23/2021.    Patient Active Problem List   Diagnosis Date Noted   Requires assistance with activities of daily living (ADL) 10/11/2021   Bronchitis 10/11/2021   Trapezium fracture 09/09/2021   CMC arthritis,  thumb, degenerative 09/09/2021   Left wrist pain 09/05/2021   Radial nerve palsy, left 09/05/2021   Acute shoulder pain due to trauma, left 07/29/2021   Slow transit constipation    Stroke (cerebrum) ([place]) 07/07/2021   CVA (cerebral vascular accident) [facility]) with intracranial vascular stenosis 06/29/2021   Intracranial vascular stenosis 06/29/2021   Prediabetes 06/29/2021   Normocytic anemia 06/29/2021   History of stroke 06/29/2021    Other drug-induced neutropenia ([place]) 06/09/2021   Malignant neoplasm of ascending colon ([place]) 06/09/2021   Stenosis of artery ([place]) 06/09/2021   Preventative health care 03/01/2021   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   Kidney disease, chronic, stage III (moderate, EGFR 30-59 ml/min) ([place]) 08/07/2020   Leukopenia 08/07/2020   Acute respiratory failure with hypoxia ([place]) 08/07/2020   SIRS (systemic inflammatory response syndrome) ([place]) 08/07/2020   Methotrexate adverse reaction, initial encounter    Acute pain of right shoulder 04/14/2020   Lower extremity edema 11/13/2019   Head trauma 11/13/2019   Dyslipidemia 11/13/2019   Memory loss 07/28/2019   Chronic right shoulder pain 07/28/2019   Frequent falls 07/28/2019   Balance problem 07/28/2019   Dysphagia 07/28/2019   Traumatic hemorrhage of cerebrum, unspecified, without loss of consciousness, initial encounter ([place])    Basilar artery stenosis with infarction ([place]) 12/18/2017   TIA (transient ischemic attack) 12/10/2017   Rheumatoid arthritis ([place])    Chest pain 06/02/2011   General medical examination 04/11/2011   PEPTIC ULCER DISEASE, HELICOBACTER PYLORI POSITIVE 07/12/2010   PERSONAL HISTORY MALIG NEOPLASM LARGE INTESTINE 05/18/2010   MITRAL VALVE DISORDERS 04/05/2009   HOARSENESS 12/04/2008   OSTEOPENIA 08/26/2008   HYPERTRIGLYCERIDEMIA 07/23/2008   EPISTAXIS 07/26/2007   ERECTILE DYSFUNCTION 02/22/2007   ECZEMA 12/07/2006   LEG CRAMPS 10/09/2006   Hypothyroidism 08/28/2006   Essential hypertension 08/28/2006   HAY FEVER 08/28/2006   INSOMNIA 08/28/2006    Conditions to be addressed/monitored: HTN, HLD, Dementia, Caregiver Stress and Fatigue.  Limited Social Support, Level of Care Concerns, ADL/IADL Limitations, Mental Health Concerns, Social Isolation, Limited Access to Caregiver, Cognitive Deficits, Memory Deficits, and Lacks Knowledge of [employer].  Care Plan : LCSW Plan of Care  Updates made by Francis Gaines, LCSW since 11/23/2021 12:00 AM     Problem: Enroll in an Adult Day Care Program of Choice. Resolved 11/23/2021  Priority: High     Goal: Enroll in an Adult Day Care Program of Choice. Completed 11/23/2021  Start Date: 11/15/2021  Expected End Date: 11/23/2021  This Visit's Progress: On track  Recent Progress: On track  Priority: High  Note:   Current Barriers:   Patient with Hypertension, Hypothyroidism, History of TIA, Mixed Hyperlipidemia, CKD, Osteopenia, Anemia, and Memory Loss, needs Support, Education, Resources, Referrals, Advocacy, and Care Coordination, to resolve unmet personal care needs in the home. Patient is unable to self-administer medications as prescribed, or consistently perform ADL's/IADL's independently. Patient lacks knowledge of available community agencies and resources. Clinical Goals:  Patient will enroll in an Adult Day Care Program, as well as receive caregiver services through a [place] and/or facility.    Patient will demonstrate improved health management independence, as evidenced by enrollment in an Adult Day Care Program, and by receiving caregiver services through a [place] and/or facility. Interventions: Collaboration with Primary Care Provider, Dr. [name] regarding development and update of comprehensive plan of care, as evidenced by provider attestation and co-signature. Inter-disciplinary care team collaboration (see longitudinal plan of care). Clinical Interventions: Caregiver Stress Acknowledged.  Consideration of In-Home Care Services Encouraged. Verbalization of Feelings Emphasized. Emotional Support Provided. Patient Goals/Self-Care Activities:  Daughter is in the process of enrolling you in an Adult Day Care Program of choice.   Daughter will consider enrolling you in a Respite Care Agency/Facility.   Daughter encouraged to contact LCSW (# [phone]) directly, if she has questions, needs assistance, or  if additional social work needs are identified in the near future.   No Follow-Up Required.   [place] Licensed Clinical Social Worker [facility] [phone]

## 2021-11-23 NOTE — Progress Notes (Unsigned)
11/24/2021 [name] [id number] 09/11/1928  Referring provider: Carollee Herter, Alferd Apa, * Primary GI doctor: Dr. Carlean Purl  ASSESSMENT AND PLAN:   Dysphagia, pharyngoesophageal phase Worse after stroke sounds more paraesophageal, denies true dysphagia. Has coughing and choking with food and liquid. We will plan for modified barium swallow Given information in the mean time, will do trial of Pepcid once daily with recent CVA and on plavix No symptoms of esophagitis, gastritis or peptic ulcer disease. We will consider upper GI series if symptoms continue with patient's history of GERD with gastric ulcers. -     SLP modified barium swallow; Future  Gastroesophageal reflux disease without esophagitis -     SLP modified barium swallow; Future We will prescribe Pepcid 40 mg once daily we will consider adding on PPI however this does interact with clopidogrel to avoid at this time Lifestyle changes discussed, avoid NSAIDS, ETOH  Cerebrovascular accident (CVA) due to stenosis of right middle cerebral artery (HCC) On Plavix and aspirin   Patient Care Team: Carollee Herter, Alferd Apa, DO as PCP - General (Family Medicine) Cherre Robins, RPH-CPP (Pharmacist) Garvin Fila, MD as Consulting Physician (Neurology)  HISTORY OF PRESENT ILLNESS: 88 y.o. male with a past medical history of hypertension, hypothyroidism, eczema, hyperlipidemia, history of peptic ulcer disease with associated H. pylori, personal history of adenocarcinoma of the colon status post right hemicolectomy and chemotherapy and others listed below presents for evaluation of dysphagia.   *Due to language barrier, an interpreter was present during the history-taking and subsequent discussion (and for part of the physical exam) with this patient.   Patient also has CVA 06/29/2021, on plavix and 325 mg ASA.  Having trouble swallowing foods for 2 years but worse after the stroke.  Has intermittent coughing when he eats fast,  denies food getting stuck.  Daughter is on the phone, states he has coughing and choking while eating. Has to wet dry rice or soups does well.  No issues with meats or breads, no feeling of food stuck in esophagus.  No reflux/GERD, no odynophagia. Denies weight loss.  Has BM every day, No melena/BRB in stool.   07/01/2015 office visit with Dr. Carlean Purl for left upper quadrant pain 05/04/2015 CT abdomen pelvis with left upper quadrant abdominal pain unremarkable 08/19/2010 endoscopy gastritis and healed ulcer and eradicated H. Pylori, 2 cm hiatal hernia, mild gastritis 2015 colonoscopy Dr. Truman Hayward at [facility] anastomosis was noted to be normal. A diminutive polyp was removed with cold forceps from the proximal colon. Grade 1 internal hemorrhoids were noted. Patient had an excellent prep. Pathology revealed colonic mucosa with focal adenomatous type changes. Benign but precancerous polyp repeat colonoscopy 1 year  Current Medications:   Current Outpatient Medications (Endocrine & Metabolic):    levothyroxine (SYNTHROID) 100 MCG tablet, Take 1 tablet (100 mcg total) by mouth daily before breakfast.  Current Outpatient Medications (Cardiovascular):    atorvastatin (LIPITOR) 40 MG tablet, Take 1 tablet (40 mg total) by mouth daily.   doxazosin (CARDURA) 1 MG tablet, Take 1 tablet (1 mg total) by mouth at bedtime.   Current Outpatient Medications (Analgesics):    acetaminophen (TYLENOL) 500 MG tablet, Take 1 tablet (500 mg total) by mouth at bedtime.   aspirin EC 81 MG tablet, Take 81 mg by mouth daily. Swallow whole.  Current Outpatient Medications (Hematological):    clopidogrel (PLAVIX) 75 MG tablet, Take 1 tablet (75 mg total) by mouth daily. Please call office at [phone] to schedule appt  with Dr. Leonie Man for continued refills  Current Outpatient Medications (Other):    capsicum (ZOSTRIX) 0.075 % topical cream, Apply 1 application. topically 4 (four) times daily -  with  meals and at bedtime. Be sure to wear glove and do not touch eye, mouth or nose (any mucous membranes) Apply to left shoulder.   docusate sodium (COLACE) 100 MG capsule, Take 2 capsules (200 mg total) by mouth daily.   famotidine (PEPCID) 40 MG tablet, Take 1 tablet (40 mg total) by mouth daily.   mirabegron ER (MYRBETRIQ) 25 MG TB24 tablet, Take 25 mg by mouth at bedtime.   Multiple Vitamins-Minerals (MULTIVITAMIN ADULT) CHEW, Chew 2 each by mouth daily.   NONFORMULARY OR COMPOUNDED ITEM, 4 wheeled walker   #1  dx frequent falls, weakness  Medical History:  Past Medical History:  Diagnosis Date   Adenocarcinoma ([place]) 1985   colon s/p right hemicolectomy and chemotherapy. F/U with cancer center and Dr. Lucia Gaskins rotinely   Depression    Eczema    Epistaxis    f/u per ENT   Erectile dysfunction    Gastric ulcer 12/11   H-Pylori Tx, EGD 3-12: gastritis   Hay fever    Head injury 11/2017   with fall   Hyperlipemia    Hypertension    Hypothyroidism    Hypothyroid   Insomnia    Osteopenia     per DEXA 07-2008 (Rx fosamax)   Rheumatoid arthritis ([place])    Stroke ([place])     " balance issue"   Allergies:  Allergies  Allergen Reactions   Methotrexate Derivatives Other (See Comments)    Mouth sores / pancytopenia   Asa [Aspirin] Other (See Comments)    Gastric symptoms - can tolerate 81 mg aspirin   Penicillin G Rash    Did it involve swelling of the face/tongue/throat, SOB, or low BP? Unknown Did it involve sudden or severe rash/hives, skin peeling, or any reaction on the inside of your mouth or nose? Unknown Did you need to seek medical attention at a hospital or doctor's office? Unknown When did it last happen?   unknown    If all above answers are "NO", may proceed with cephalosporin use.     Surgical History:  He  has a past surgical history that includes Colonoscopy (2005, 2008); Inguinal hernia repair; Colon surgery; IR ANGIO VERTEBRAL SEL VERTEBRAL BILAT MOD SED (12/13/2017);  IR ANGIO INTRA EXTRACRAN SEL COM CAROTID INNOMINATE BILAT MOD SED (12/13/2017); Radiology with anesthesia (N/A, 12/18/2017); and IR PTA Intracranial (12/18/2017). Family History:  His family history is not on file. Social History:   reports that he has never smoked. He has never been exposed to tobacco smoke. He has never used smokeless tobacco. He reports that he does not drink alcohol and does not use drugs.  REVIEW OF SYSTEMS  : All other systems reviewed and negative except where noted in the History of Present Illness.   PHYSICAL EXAM: BP 130/70 (BP Location: Left Arm, Patient Position: Sitting, Cuff Size: Normal)   Pulse 72   Ht '4\' 11"'$  (1.499 m) Comment: height measured without shoes  Wt 123 lb 8 oz (56 kg)   BMI 24.94 kg/m  General:  elderly appearing  male in no acute distress in wheel chair Head:   Normocephalic and atraumatic. Eyes:  sclerae anicteric,conjunctive pink  Heart:   regular rate and rhythm Pulm:  Clear anteriorly; no wheezing Abdomen:   Soft, Obese AB, Active bowel sounds. No tenderness . ,  No organomegaly appreciated. Rectal: Not evaluated Extremities:  Without edema. Msk: left sided weakness on exam, in wheel chair, without gross deformities. Peripheral pulses intact.  Neurologic:  Alert and  oriented x4;  left sided weakness, in wheel chair Skin:   Dry and intact without significant lesions or rashes. Psychiatric:  Cooperative. Normal mood and affect.    Vladimir Crofts, PA-C 1:55 PM

## 2021-11-23 NOTE — Patient Instructions (Addendum)
Visit Information  Thank you for taking time to visit with me today. Please don't hesitate to contact me if I can be of assistance to you before our next scheduled telephone appointment.  Following are the goals we discussed today:  Patient Goals/Self-Care Activities:  Daughter is in the process of enrolling you in an Adult Day Care Program of choice.   Daughter will consider enrolling you in a Respite Care Agency/Facility.   Daughter encouraged to contact LCSW (# [phone]) directly, if she has questions, needs assistance, or if additional social work needs are identified in the near future.   No Follow-Up Required.  Please call the care guide team at [phone] if you need to cancel or reschedule your appointment.   If you are experiencing a Mental Health or [place] or need someone to talk to, please call the Suicide and Crisis Lifeline: 988 call the [place] National Suicide Prevention Lifeline: 226-026-4671 or TTY: (984) 489-2516 TTY 339-500-4682) to talk to a trained counselor call 1-800-273-TALK (toll free, 24 hour hotline) go to [facility] [address] [place] [id number]) call the [place]: [phone] call 911   Patient verbalizes understanding of instructions and care plan provided today and agrees to view in [place]. Active MyChart status and patient understanding of how to access instructions and care plan via MyChart confirmed with patient.     [place] Licensed Clinical Social Worker [facility] [phone]

## 2021-11-24 ENCOUNTER — Telehealth (HOSPITAL_COMMUNITY): Payer: Self-pay

## 2021-11-24 ENCOUNTER — Encounter: Payer: Self-pay | Admitting: Physician Assistant

## 2021-11-24 ENCOUNTER — Ambulatory Visit: Payer: Medicare Other | Admitting: Physician Assistant

## 2021-11-24 VITALS — BP 130/70 | HR 72 | Ht 59.0 in | Wt 123.5 lb

## 2021-11-24 DIAGNOSIS — I63511 Cerebral infarction due to unspecified occlusion or stenosis of right middle cerebral artery: Secondary | ICD-10-CM | POA: Diagnosis not present

## 2021-11-24 DIAGNOSIS — R1314 Dysphagia, pharyngoesophageal phase: Secondary | ICD-10-CM

## 2021-11-24 DIAGNOSIS — K219 Gastro-esophageal reflux disease without esophagitis: Secondary | ICD-10-CM

## 2021-11-24 MED ORDER — FAMOTIDINE 40 MG PO TABS: 40.0000 mg | ORAL_TABLET | Freq: Every day | ORAL | 0 refills | Status: DC

## 2021-11-24 NOTE — Patient Instructions (Signed)
Please take your proton pump inhibitor medication,pepcid once a day for at least 8 weeks.   Please take this medication 30 minutes to 1 hour before meals- this makes it more effective.   Behavioral and Dietary Strategies for Management of Esophageal Dysmotility/dysphagia 1. Take reflux medications 30+ minutes before other po intake, in the morning 2. Begin meals with warm beverage 3. Eat smaller more frequent meals 4. Eat slowly, taking small bites and sips 5. Alternate solids and liquids 6. Avoid foods/liquids that increase acid production 7. Sit upright during and for 30+ minutes after meals to facilitate esophageal clearing  Avoid spicy and acidic foods Avoid fatty foods Limit your intake of coffee, tea, alcohol, and carbonated drinks Work to maintain a healthy weight Keep the head of the bed elevated at least 3 inches with blocks or a wedge pillow if you are having any nighttime symptoms Stay upright for 2 hours after eating Avoid meals and snacks three to four hours before bedtime  You have been scheduled for a modified barium swallow on ___________ at _____________. Please arrive 15 minutes prior to your test for registration. You will go to _________________ Radiology (1st Floor) for your appointment. Should you need to cancel or reschedule your appointment, please contact [phone] [place] [place]) or [phone] [place]). _____________________________________________________________________ A Modified Barium Swallow Study, or MBS, is a special x-ray that is taken to check swallowing skills. It is carried out by a Stage manager and a Psychologist, clinical (SLP). During this test, yourmouth, throat, and esophagus, a muscular tube which connects your mouth to your stomach, is checked. The test will help you, your doctor, and the SLP plan what types of foods and liquids are easier for you to swallow. The SLP will also identify positions and ways to help you swallow more easily  and safely. What will happen during an MBS? You will be taken to an x-ray room and seated comfortably. You will be asked to swallow small amounts of food and liquid mixed with barium. Barium is a liquid or paste that allows images of your mouth, throat and esophagus to be seen on x-ray. The x-ray captures moving images of the food you are swallowing as it travels from your mouth through your throat and into your esophagus. This test helps identify whether food or liquid is entering your lungs (aspiration). The test also shows which part of your mouth or throat lacks strength or coordination to move the food or liquid in the right direction. This test typically takes 30 minutes to 1 hour to complete. _______________________________________________________________________    Due to recent changes in healthcare laws, you may see the results of your imaging and laboratory studies on MyChart before your provider has had a chance to review them.  We understand that in some cases there may be results that are confusing or concerning to you. Not all laboratory results come back in the same time frame and the provider may be waiting for multiple results in order to interpret others.  Please give [place] 48 hours in order for your provider to thoroughly review all the results before contacting the office for clarification of your results.    I appreciate the  opportunity to care for you  Thank You   [facility]

## 2021-11-24 NOTE — Telephone Encounter (Signed)
2nd attempt to contact daughter of patient to schedule OP MBS - left voicemail.

## 2021-11-25 ENCOUNTER — Other Ambulatory Visit (HOSPITAL_COMMUNITY): Payer: Self-pay

## 2021-11-25 DIAGNOSIS — R1310 Dysphagia, unspecified: Secondary | ICD-10-CM

## 2021-11-29 ENCOUNTER — Telehealth (HOSPITAL_BASED_OUTPATIENT_CLINIC_OR_DEPARTMENT_OTHER): Payer: Self-pay | Admitting: *Deleted

## 2021-11-29 NOTE — Telephone Encounter (Signed)
Advised daughter and she stated she already stopped ASA

## 2021-11-29 NOTE — Telephone Encounter (Signed)
-----   Message from Skeet Latch, MD sent at 11/27/2021  2:05 PM EDT ----- No more aspirin ----- Message ----- From: Francella Solian, RMA Sent: 11/14/2021   1:46 PM EDT To: Skeet Latch, MD; Earvin Hansen, LPN  Please calrify. Last OV note says when pt out of ASA, he should be on Plavix monotherapy. Pt should no longer be taking ASA?

## 2021-11-30 ENCOUNTER — Ambulatory Visit: Payer: Medicare Other | Admitting: Physical Therapy

## 2021-12-01 ENCOUNTER — Ambulatory Visit: Payer: Medicare Other | Admitting: Physical Therapy

## 2021-12-03 ENCOUNTER — Other Ambulatory Visit: Payer: Self-pay

## 2021-12-03 ENCOUNTER — Encounter (HOSPITAL_COMMUNITY): Payer: Self-pay

## 2021-12-03 ENCOUNTER — Emergency Department (HOSPITAL_COMMUNITY): Payer: Medicare Other

## 2021-12-03 ENCOUNTER — Emergency Department (HOSPITAL_COMMUNITY)
Admission: EM | Admit: 2021-12-03 | Discharge: 2021-12-03 | Disposition: A | Discharge status: Home / Self Care | Payer: Medicare Other | Attending: Emergency Medicine | Admitting: Emergency Medicine

## 2021-12-03 DIAGNOSIS — R0789 Other chest pain: Secondary | ICD-10-CM | POA: Diagnosis not present

## 2021-12-03 DIAGNOSIS — R079 Chest pain, unspecified: Secondary | ICD-10-CM

## 2021-12-03 DIAGNOSIS — R0602 Shortness of breath: Secondary | ICD-10-CM | POA: Diagnosis not present

## 2021-12-03 DIAGNOSIS — Z7902 Long term (current) use of antithrombotics/antiplatelets: Secondary | ICD-10-CM | POA: Diagnosis not present

## 2021-12-03 DIAGNOSIS — Z7982 Long term (current) use of aspirin: Secondary | ICD-10-CM | POA: Diagnosis not present

## 2021-12-03 LAB — CBC
HCT: 39.8 % (ref 39.0–52.0)
Hemoglobin: 12.7 g/dL — ABNORMAL LOW (ref 13.0–17.0)
MCH: 29.2 pg (ref 26.0–34.0)
MCHC: 31.9 g/dL (ref 30.0–36.0)
MCV: 91.5 fL (ref 80.0–100.0)
Platelets: 197 10*3/uL (ref 150–400)
RBC: 4.35 MIL/uL (ref 4.22–5.81)
RDW: 13.3 % (ref 11.5–15.5)
WBC: 8.9 10*3/uL (ref 4.0–10.5)
nRBC: 0 % (ref 0.0–0.2)

## 2021-12-03 LAB — BASIC METABOLIC PANEL
Anion gap: 8 (ref 5–15)
BUN: 23 mg/dL (ref 8–23)
CO2: 25 mmol/L (ref 22–32)
Calcium: 9.3 mg/dL (ref 8.9–10.3)
Chloride: 106 mmol/L (ref 98–111)
Creatinine, Ser: 1.62 mg/dL — ABNORMAL HIGH (ref 0.61–1.24)
GFR, Estimated: 40 mL/min — ABNORMAL LOW (ref >60)
Glucose, Bld: 117 mg/dL — ABNORMAL HIGH (ref 70–99)
Potassium: 4.2 mmol/L (ref 3.5–5.1)
Sodium: 139 mmol/L (ref 135–145)

## 2021-12-03 LAB — TROPONIN I (HIGH SENSITIVITY)
Troponin I (High Sensitivity): 5 ng/L (ref <18)
Troponin I (High Sensitivity): 5 ng/L (ref <18)

## 2021-12-03 NOTE — ED Triage Notes (Signed)
Patient was visiting his wife in the hospital and began grabbing his chest and c/o SOB approx 10 minutes ago.

## 2021-12-03 NOTE — ED Provider Notes (Signed)
[place] DEPT Provider Note   CSN: [id number] Arrival date & time: 12/03/21  1725     History  Chief Complaint  Patient presents with   Chest Pain   Shortness of Breath    [name] is a 88 y.o. male.  Patient presents to ER chief complaint of chest pain shortness of breath.  Per nursing report he was upstairs visiting his wife is very sick and hospitalized upstairs.  After hearing some bad news he became very emotional clutched his chest and complained of shortness of breath.  Associated with chest pain.  Brought to the ER for further evaluation.  No reports of fevers or cough or vomiting or diarrhea no headache no abdominal pain reported.       Home Medications Prior to Admission medications   Medication Sig Start Date End Date Taking? Authorizing Provider  acetaminophen (TYLENOL) 500 MG tablet Take 1 tablet (500 mg total) by mouth at bedtime. 07/29/21   Love, Ivan Anchors, PA-C  aspirin EC 81 MG tablet Take 81 mg by mouth daily. Swallow whole.    [provider]  atorvastatin (LIPITOR) 40 MG tablet Take 1 tablet (40 mg total) by mouth daily. 09/05/21   [name] R, DO  capsicum (ZOSTRIX) 0.075 % topical cream Apply 1 application. topically 4 (four) times daily -  with meals and at bedtime. Be sure to wear glove and do not touch eye, mouth or nose (any mucous membranes) Apply to left shoulder. 07/29/21   Love, Ivan Anchors, PA-C  clopidogrel (PLAVIX) 75 MG tablet Take 1 tablet (75 mg total) by mouth daily. Please call office at [phone] to schedule appt with Dr. Leonie Man for continued refills 10/19/21   Garvin Fila, MD  docusate sodium (COLACE) 100 MG capsule Take 2 capsules (200 mg total) by mouth daily. 07/29/21   Love, Ivan Anchors, PA-C  doxazosin (CARDURA) 1 MG tablet Take 1 tablet (1 mg total) by mouth at bedtime. 10/03/21   Skeet Latch, MD  famotidine (PEPCID) 40 MG tablet Take 1 tablet (40 mg total) by mouth daily. 11/24/21    Vladimir Crofts, PA-C  levothyroxine (SYNTHROID) 100 MCG tablet Take 1 tablet (100 mcg total) by mouth daily before breakfast. 03/01/21   Carollee Herter, Alferd Apa, DO  mirabegron ER (MYRBETRIQ) 25 MG TB24 tablet Take 25 mg by mouth at bedtime.    [provider]  Multiple Vitamins-Minerals (MULTIVITAMIN ADULT) CHEW Chew 2 each by mouth daily.    [provider]  NONFORMULARY OR COMPOUNDED ITEM 4 wheeled walker   #1  dx frequent falls, weakness 06/09/21   Carollee Herter, Alferd Apa, DO      Allergies    Methotrexate derivatives, Asa [aspirin], and Penicillin g    Review of Systems   Review of Systems  Constitutional:  Negative for fever.  HENT:  Negative for ear pain and sore throat.   Eyes:  Negative for pain.  Respiratory:  Negative for cough.   Cardiovascular:  Positive for chest pain.  Gastrointestinal:  Negative for abdominal pain.  Genitourinary:  Negative for flank pain.  Musculoskeletal:  Negative for back pain.  Skin:  Negative for color change and rash.  Neurological:  Negative for syncope.  All other systems reviewed and are negative.   Physical Exam Updated Vital Signs BP (!) 143/56   Pulse 65   Temp 98.7 F (37.1 C) (Oral)   Resp 20   Ht '4\' 11"'$  (1.499 m)  Wt 56 kg   SpO2 99%   BMI 24.94 kg/m  Physical Exam Constitutional:      Appearance: He is well-developed.  HENT:     Head: Normocephalic.     Nose: Nose normal.  Eyes:     Extraocular Movements: Extraocular movements intact.  Cardiovascular:     Rate and Rhythm: Normal rate.  Pulmonary:     Effort: Pulmonary effort is normal.  Abdominal:     Tenderness: There is no abdominal tenderness. There is no guarding.  Skin:    Coloration: Skin is not jaundiced.  Neurological:     Mental Status: He is alert. Mental status is at baseline.     ED Results / Procedures / Treatments   Labs (all labs ordered are listed, but only abnormal results are displayed) Labs Reviewed  BASIC METABOLIC  PANEL - Abnormal; Notable for the following components:      Result Value   Glucose, Bld 117 (*)    Creatinine, Ser 1.62 (*)    GFR, Estimated 40 (*)    All other components within normal limits  CBC - Abnormal; Notable for the following components:   Hemoglobin 12.7 (*)    All other components within normal limits  TROPONIN I (HIGH SENSITIVITY)  TROPONIN I (HIGH SENSITIVITY)    EKG EKG Interpretation  Date/Time:  Saturday December 03 2021 17:53:40 EDT Ventricular Rate:  59 PR Interval:  195 QRS Duration: 93 QT Interval:  419 QTC Calculation: 415 R Axis:   -19 Text Interpretation: Sinus rhythm Borderline left axis deviation Abnormal R-wave progression, early transition Confirmed by Thamas Jaegers ([id number]) on 12/03/2021 6:06:49 PM  Radiology DG Chest Port 1 View  Result Date: 12/03/2021 CLINICAL DATA:  Chest pain, shortness of breath EXAM: PORTABLE CHEST 1 VIEW COMPARISON:  10/11/2021 FINDINGS: Single frontal view of the chest demonstrates an unremarkable cardiac silhouette. Stable atherosclerosis of the aortic arch. No airspace disease, effusion, or pneumothorax. No acute bony abnormalities. IMPRESSION: 1. Stable chest, no acute process. Electronically Signed   By: Randa Ngo M.D.   On: 12/03/2021 18:02    Procedures Procedures    Medications Ordered in ED Medications - No data to display  ED Course/ Medical Decision Making/ A&P                           Medical Decision Making  Review of records show office visit November 24, 2021 for dysphagia.  History from family members and nursing staff.  Patient is attended by his caregiver who also provided history.  Work-up included labs troponins are flat x2 creatinine 1.6 within normal limits.  Chest x-ray is unremarkable.  Patient remains chest pain-free.  Symptoms lasted only about 10 minutes and then have completely resolved without recurrence.  EKG shows sinus rhythm no ST elevations depressions no T wave inversions are noted rate  is regular.  Patient will be discharged home.  Plans to go back up to see his wife and will meet with his family again upstairs.        Final Clinical Impression(s) / ED Diagnoses Final diagnoses:  Chest pain, unspecified type    Rx / DC Orders ED Discharge Orders     None         Luna Fuse, MD 12/03/21 2057

## 2021-12-03 NOTE — Discharge Instructions (Signed)
Call your primary care doctor or specialist as discussed in the next 2-3 days.   Return immediately back to the ER if:  Your symptoms worsen within the next 12-24 hours. You develop new symptoms such as new fevers, persistent vomiting, new pain, shortness of breath, or new weakness or numbness, or if you have any other concerns.  

## 2021-12-03 NOTE — ED Provider Triage Note (Signed)
Emergency Medicine Provider Triage Evaluation Note  [name] , a 88 y.o. male  was evaluated in triage.  Pt complains of chest pain.  Patient presents with caretaker, apparently the patient's wife is being cared for upstairs and has recently received a diagnosis of terminal illness.  The patient caretaker states that prior to receiving this news of terminal illness, the patient suddenly grabbed his chest.  The patient caretaker states that the patient had what appeared to be an episode of confusion following this, he was trying to cover his wife up, walking around the room disoriented.  The patient caretaker states that they then brought the patient down to the ED.  Presently, the patient denies chest pain.  The patient denies any pain at all.  The patient denies any shortness of breath, nausea, vomiting, abdominal pain.  Review of Systems  Positive:  Negative:   Physical Exam  Ht '4\' 11"'$  (1.499 m)   Wt 56 kg   BMI 24.94 kg/m  Gen:   Awake, no distress   Resp:  Normal effort  MSK:   Moves extremities without difficulty  Other:  Lung sounds clear.  No edema bilaterally to lower extremities.  Medical Decision Making  Medically screening exam initiated at 5:37 PM.  Appropriate orders placed.  [name] was informed that the remainder of the evaluation will be completed by another provider, this initial triage assessment does not replace that evaluation, and the importance of remaining in the ED until their evaluation is complete.     Azucena Cecil, PA-C 12/03/21 1740

## 2021-12-06 ENCOUNTER — Other Ambulatory Visit: Payer: Self-pay | Admitting: Family Medicine

## 2021-12-06 ENCOUNTER — Ambulatory Visit: Payer: Medicare Other

## 2021-12-06 DIAGNOSIS — E039 Hypothyroidism, unspecified: Secondary | ICD-10-CM

## 2021-12-08 ENCOUNTER — Ambulatory Visit (HOSPITAL_COMMUNITY): Payer: Medicare Other

## 2021-12-08 ENCOUNTER — Encounter (HOSPITAL_COMMUNITY): Payer: Medicare Other

## 2021-12-09 ENCOUNTER — Ambulatory Visit: Payer: Medicare Other | Admitting: Physical Therapy

## 2021-12-13 ENCOUNTER — Ambulatory Visit: Payer: Medicare Other | Admitting: Physical Therapy

## 2021-12-13 ENCOUNTER — Telehealth (INDEPENDENT_AMBULATORY_CARE_PROVIDER_SITE_OTHER): Payer: Medicare Other | Admitting: Family Medicine

## 2021-12-13 ENCOUNTER — Encounter: Payer: Self-pay | Admitting: Family Medicine

## 2021-12-13 VITALS — BP 121/73

## 2021-12-13 DIAGNOSIS — G4700 Insomnia, unspecified: Secondary | ICD-10-CM

## 2021-12-13 MED ORDER — QUETIAPINE FUMARATE 25 MG PO TABS: 25.0000 mg | ORAL_TABLET | Freq: Every day | ORAL | 1 refills | Status: DC

## 2021-12-13 NOTE — Progress Notes (Signed)
MyChart Video Visit    Virtual Visit via Video Note   This visit type was conducted due to national recommendations for restrictions regarding the COVID-19 Pandemic (e.g. social distancing) in an effort to limit this patient's exposure and mitigate transmission in our community. This patient is at least at moderate risk for complications without adequate follow up. This format is felt to be most appropriate for this patient at this time. Physical exam was limited by quality of the video and audio technology used for the visit. [name] was able to get the patient set up on a video visit.  Patient location: Home Patient and provider in visit Provider location: Office  I discussed the limitations of evaluation and management by telemedicine and the availability of in person appointments. The patient expressed understanding and agreed to proceed.  Visit Date: 12/13/2021  Today's healthcare provider: [name], DO     Subjective:    Patient ID: [name], male    DOB: [birth year], 88 y.o.   MRN: [id number]  No chief complaint on file.   HPI Patient is in today for virtual follow up.  He is accompanied with his daughter who helps translate for him.  He says that he is doing well, but his daughter states that he has not been sleeping well at night. Sometimes he can fall asleep, but other times he will not fall asleep until 4 am.  He was prescribed melatonin at the hospital which improved his sleeping habits. He has also been eating well. His blood pressure yesterday 121/73 mmHg.  His lab work have been rescheduled.    Past Medical History:  Diagnosis Date   Adenocarcinoma ([place]) 1985   colon s/p right hemicolectomy and chemotherapy. F/U with cancer center and Dr. Lucia Gaskins rotinely   Depression    Eczema    Epistaxis    f/u per ENT   Erectile dysfunction    Gastric ulcer 12/11   H-Pylori Tx, EGD 3-12: gastritis   Hay fever    Head injury 11/2017   with  fall   Hyperlipemia    Hypertension    Hypothyroidism    Hypothyroid   Insomnia    Osteopenia     per DEXA 07-2008 (Rx fosamax)   Rheumatoid arthritis ([place])    Stroke ([place])     " balance issue"    Past Surgical History:  Procedure Laterality Date   COLON SURGERY     Right / Adenocarcinoma / Chemo   COLONOSCOPY  2005, 2008   Dr. Lucia Gaskins   INGUINAL HERNIA REPAIR     Right   IR ANGIO INTRA EXTRACRAN SEL COM CAROTID INNOMINATE BILAT MOD SED  12/13/2017   IR ANGIO VERTEBRAL SEL VERTEBRAL BILAT MOD SED  12/13/2017   IR PTA INTRACRANIAL  12/18/2017   RADIOLOGY WITH ANESTHESIA N/A 12/18/2017   Procedure: Angioplasty with stenting;  Surgeon: Luanne Bras, MD;  Location: [facility];  Service: Radiology;  Laterality: N/A;    Family History  Problem Relation Age of Onset   Diabetes Neg Hx    Heart attack Neg Hx    Colon cancer Neg Hx    Prostate cancer Neg Hx     Social History   Socioeconomic History   Marital status: Widowed    Spouse name: Not on file   Number of children: 2   Years of education: 12th   Highest education level: 12th grade  Occupational History    Employer: RETIRED  Tobacco Use  Smoking status: Never    Passive exposure: Never   Smokeless tobacco: Never   Tobacco comments:    Verified by Daughter, [name]  Vaping Use   Vaping Use: Never used  Substance and Sexual Activity   Alcohol use: No    Alcohol/week: 0.0 standard drinks of alcohol   Drug use: No   Sexual activity: Not Currently  Other Topics Concern   Not on file  Social History Narrative   Original from [place]   Vegetarian   Daily caffeine use one per day   Social Determinants of Health   Financial Resource Strain: Low Risk  (11/16/2021)   Overall Financial Resource Strain (CARDIA)    Difficulty of Paying Living Expenses: Not hard at all  Food Insecurity: No Food Insecurity (11/16/2021)   Hunger Vital Sign    Worried About Running Out of Food in the Last Year: Never true     Ran Out of Food in the Last Year: Never true  Transportation Needs: No Transportation Needs (11/16/2021)   PRAPARE - Hydrologist (Medical): No    Lack of Transportation (Non-Medical): No  Physical Activity: Insufficiently Active (11/16/2021)   Exercise Vital Sign    Days of Exercise per Week: 2 days    Minutes of Exercise per Session: 50 min  Stress: No Stress Concern Present (11/16/2021)   [place]    Feeling of Stress : Not at all  Social Connections: Moderately Isolated (11/16/2021)   Social Connection and Isolation Panel [NHANES]    Frequency of Communication with Friends and Family: More than three times a week    Frequency of Social Gatherings with Friends and Family: More than three times a week    Attends Religious Services: More than 4 times per year    Active Member of [employer] or Organizations: No    Attends Archivist Meetings: Never    Marital Status: Widowed  Intimate Partner Violence: Not At Risk (11/16/2021)   Humiliation, Afraid, Rape, and Kick questionnaire    Fear of Current or Ex-Partner: No    Emotionally Abused: No    Physically Abused: No    Sexually Abused: No    Outpatient Medications Prior to Visit  Medication Sig Dispense Refill   acetaminophen (TYLENOL) 500 MG tablet Take 1 tablet (500 mg total) by mouth at bedtime. 100 tablet 0   aspirin EC 81 MG tablet Take 81 mg by mouth daily. Swallow whole.     atorvastatin (LIPITOR) 40 MG tablet Take 1 tablet (40 mg total) by mouth daily. 90 tablet 1   capsicum (ZOSTRIX) 0.075 % topical cream Apply 1 application. topically 4 (four) times daily -  with meals and at bedtime. Be sure to wear glove and do not touch eye, mouth or nose (any mucous membranes) Apply to left shoulder. 57 g 0   docusate sodium (COLACE) 100 MG capsule Take 2 capsules (200 mg total) by mouth daily. 60 capsule 0   doxazosin (CARDURA) 1 MG tablet  Take 1 tablet (1 mg total) by mouth at bedtime. 90 tablet 3   famotidine (PEPCID) 40 MG tablet Take 1 tablet (40 mg total) by mouth daily. 90 tablet 0   levothyroxine (SYNTHROID) 100 MCG tablet TAKE 1 TABLET BY MOUTH DAILY BEFORE BREAKFAST. 90 tablet 1   mirabegron ER (MYRBETRIQ) 25 MG TB24 tablet Take 25 mg by mouth at bedtime.     Multiple Vitamins-Minerals (MULTIVITAMIN ADULT)  CHEW Chew 2 each by mouth daily.     NONFORMULARY OR COMPOUNDED ITEM 4 wheeled walker   #1  dx frequent falls, weakness 1 each 0   clopidogrel (PLAVIX) 75 MG tablet Take 1 tablet (75 mg total) by mouth daily. Please call office at [phone] to schedule appt with Dr. Leonie Man for continued refills (Patient not taking: Reported on 12/14/2021) 30 tablet 0   No facility-administered medications prior to visit.    Allergies  Allergen Reactions   Methotrexate Derivatives Other (See Comments)    Mouth sores / pancytopenia   Asa [Aspirin] Other (See Comments)    Gastric symptoms - can tolerate 81 mg aspirin   Penicillin G Rash    Did it involve swelling of the face/tongue/throat, SOB, or low BP? Unknown Did it involve sudden or severe rash/hives, skin peeling, or any reaction on the inside of your mouth or nose? Unknown Did you need to seek medical attention at a hospital or doctor's office? Unknown When did it last happen?   unknown    If all above answers are "NO", may proceed with cephalosporin use.    Review of Systems  Constitutional:  Negative for fever and malaise/fatigue.  HENT:  Negative for congestion.   Eyes:  Negative for blurred vision.  Respiratory:  Negative for shortness of breath.   Cardiovascular:  Negative for chest pain, palpitations and leg swelling.  Gastrointestinal:  Negative for abdominal pain, blood in stool and nausea.  Genitourinary:  Negative for dysuria and frequency.  Musculoskeletal:  Negative for falls.  Skin:  Negative for rash.  Neurological:  Negative for dizziness, loss of  consciousness and headaches.  Endo/Heme/Allergies:  Negative for environmental allergies.  Psychiatric/Behavioral:  Negative for depression. The patient is nervous/anxious.        Objective:    Physical Exam Vitals and nursing note reviewed.  Constitutional:      Appearance: He is well-developed.  HENT:     Head: Normocephalic and atraumatic.  Neck:     Thyroid: No thyromegaly.  Musculoskeletal:     Right hip: Tenderness present. Normal range of motion. Normal strength.     Left hip: Tenderness present. Normal range of motion. Normal strength.     Right foot: Bony tenderness present. No swelling.     Left foot: Bony tenderness present. No swelling.  Skin:    General: Skin is warm and dry.  Neurological:     Mental Status: He is alert.  Psychiatric:        Mood and Affect: Mood is not anxious.        Behavior: Behavior normal.        Thought Content: Thought content normal.        Judgment: Judgment normal.    BP 121/73  Wt Readings from Last 3 Encounters:  12/15/21 123 lb (55.8 kg)  12/15/21 123 lb (55.8 kg)  12/03/21 123 lb 8 oz (56 kg)    Diabetic Foot Exam - Simple   No data filed    Lab Results  Component Value Date   WBC 10.0 12/15/2021   HGB 13.4 12/15/2021   HCT 42.0 12/15/2021   PLT 197 12/03/2021   GLUCOSE 113 (H) 12/15/2021   CHOL 133 11/08/2021   TRIG 387.0 (H) 11/08/2021   HDL 33.30 (L) 11/08/2021   LDLDIRECT 51.0 11/08/2021   LDLCALC 102 (H) 06/29/2021   ALT 28 12/15/2021   AST 24 12/15/2021   NA 143 12/15/2021   K 4.0 12/15/2021  CL 102 12/15/2021   CREATININE 1.51 (H) 12/15/2021   BUN 22 12/15/2021   CO2 26 12/15/2021   TSH 1.500 12/15/2021   PSA 2.76 03/03/2021   INR 1.1 08/07/2020   HGBA1C 5.8 (H) 06/29/2021    Lab Results  Component Value Date   TSH 1.500 12/15/2021   Lab Results  Component Value Date   WBC 10.0 12/15/2021   HGB 13.4 12/15/2021   HCT 42.0 12/15/2021   MCV 91 12/15/2021   PLT 197 12/03/2021   Lab  Results  Component Value Date   NA 143 12/15/2021   K 4.0 12/15/2021   CO2 26 12/15/2021   GLUCOSE 113 (H) 12/15/2021   BUN 22 12/15/2021   CREATININE 1.51 (H) 12/15/2021   BILITOT 0.2 12/15/2021   ALKPHOS 86 12/15/2021   AST 24 12/15/2021   ALT 28 12/15/2021   PROT 7.0 12/15/2021   ALBUMIN 4.3 12/15/2021   CALCIUM 9.7 12/15/2021   ANIONGAP 8 12/03/2021   EGFR 43 (L) 12/15/2021   GFR 41.13 (L) 11/08/2021   Lab Results  Component Value Date   CHOL 133 11/08/2021   Lab Results  Component Value Date   HDL 33.30 (L) 11/08/2021   Lab Results  Component Value Date   LDLCALC 102 (H) 06/29/2021   Lab Results  Component Value Date   TRIG 387.0 (H) 11/08/2021   Lab Results  Component Value Date   CHOLHDL 4 11/08/2021   Lab Results  Component Value Date   HGBA1C 5.8 (H) 06/29/2021       Assessment & Plan:   Problem List Items Addressed This Visit       Unprioritized   INSOMNIA - Primary   Relevant Medications   QUEtiapine (SEROQUEL) 25 MG tablet     Meds ordered this encounter  Medications   QUEtiapine (SEROQUEL) 25 MG tablet    Sig: Take 1 tablet (25 mg total) by mouth at bedtime.    Dispense:  30 tablet    Refill:  1    I discussed the assessment and treatment plan with the patient. The patient was provided an opportunity to ask questions and all were answered. The patient agreed with the plan and demonstrated an understanding of the instructions.   The patient was advised to call back or seek an in-person evaluation if the symptoms worsen or if the condition fails to improve as anticipated.  I provided 20 minutes of face-to-face time during this encounter.    I,Tinashe Williams,acting as a Education administrator for [employer], DO.,have documented all relevant documentation on the behalf of [name], DO,as directed by  [name], DO while in the presence of [name], DO.    [name], DO [place] at [employer] [phone] (phone) [phone] (fax)  [place]

## 2021-12-14 ENCOUNTER — Ambulatory Visit: Payer: Medicare Other | Admitting: Pharmacist

## 2021-12-14 DIAGNOSIS — I10 Essential (primary) hypertension: Secondary | ICD-10-CM

## 2021-12-14 DIAGNOSIS — G4700 Insomnia, unspecified: Secondary | ICD-10-CM

## 2021-12-14 DIAGNOSIS — Z8673 Personal history of transient ischemic attack (TIA), and cerebral infarction without residual deficits: Secondary | ICD-10-CM

## 2021-12-14 NOTE — Patient Instructions (Signed)
Mr. Aston (and Mrs. Hutchinson) It was a pleasure speaking with you again Below is a summary of your health goals and care plan  Patient Goals/Self-Care Activities Over the next 90 days, patient will:  Take medications as prescribed, Focus on medication adherence by using weekly pill container with alarms Discuss with neurologist tomorrow recommendation for aspirin and clopidogrel.  Contact provider or clinical pharmacist with medication questions.  Start melatonin '5mg'$  20 to 30 minutes prior to bedtime. May increase to up to '10mg'$  at bedtime if needed. If after 1 week no improvement in sleep, then start quetiapine '25mg'$  at bedtime.   If you have any questions or concerns, please feel free to contact me either at the phone number below or with a MyChart message.   Keep up the good work!  Cherre Robins, PharmD Clinical Pharmacist [facility] [phone] (direct line)  [phone] (main office number)  Chronic Care Management Care Plan  Hypertension / prostate health: Controlled; blood pressure goal <130/80  BP Readings from Last 3 Encounters:  12/13/21 121/73  12/03/21 (!) 143/56  11/24/21 130/70   Current treatment: Doxazosin '1mg'$  daily Interventions:  Reviewed blood pressure goals Reviewed limiting intake of sodium for blood pressure and edema Check blood pressure 3 to 4 time per week - report systolic blood pressure > 580 or diastolic > 90  Hyperlipidemia, mixed / recent CVA: LDL and Triglycerides not at goal; LDL goal <70 and Triglyceride goal <150  Lipid Panel     Component Value Date/Time   CHOL 133 11/08/2021 1513   TRIG 387.0 (H) 11/08/2021 1513   HDL 33.30 (L) 11/08/2021 1513   CHOLHDL 4 11/08/2021 1513   VLDL 77.4 (H) 11/08/2021 1513   LDLCALC 102 (H) 06/29/2021 2154   LDLDIRECT 51.0 11/08/2021 1513   Current treatment:  Aspirin 81 mg daily - (was due to stop 11/03/2021) Clopidogrel '75mg'$  daily (not taking) Atorvastatin '40mg'$   daily  Interventions: Discussed lipid goals Discuss with neurologist tomorrow 12/16/2021 - recommendation for aspirin and clopidogrel.  Continue to take atorvastatin to lower cholesterol  Hypothyroidism:  Controlled; Goal: TSH in therapeutic range Current treatment:  Levothyroxine 172mg take 1 tablet each morning Interventions: Discussed symptoms of low thyroid Discussed importance of taking levothyroxine daily   Osteopenia with low vitamin D:  Last DEXA 2012 showed T-Score of -1.8 at neck of right femur Current therapy:  Multivitamin daily  Interventions:  Consider rechecking serum vitamin D with next labs Fall prevention. Continue physical therapy  Medication management Pharmacist Clinical Goal(s): Over the next 90 days, patient will work with PharmD and providers to maintain optimal medication adherence Current pharmacy: [facility] Interventions Comprehensive medication review performed. Reviewed refill history and assessed adherence Continue current medication management strategy Patient self care activities - Over the next 90 days, patient will: Focus on medication adherence by filling medications appropriately  Purchase weekly pill container with alarms / reminders Take medications as prescribed Report any questions or concerns to PharmD and/or provider(s)   Follow Up Plan: Telephone follow up appointment with care management team member scheduled for:  1 to 2 months.     Patient verbalizes understanding of instructions and care plan provided today and agrees to view in [facility] Active MyChart status and patient understanding of how to access instructions and care plan via MyChart confirmed with patient.

## 2021-12-14 NOTE — Chronic Care Management (AMB) (Signed)
Chronic Care Management Pharmacy Note  12/14/2021 Name:  [name] MRN:  [id number] DOB:  November 14, 1928  Summary: Recent CVA - noted by cardiology that last dose of aspirin should be 11/03/2021. But patient's daughter reports he is taking aspirin 11m daily. Per cardio notes he was to continue clopidogrel 771mweekly but daughter reports they stopped clopidogrel. Patient has follow up with neurologist tomorrow and daughter to ask about aspirin and clopidogrel.  Insomnia: patient's wife died last week and he is having difficulty sleeping. Very exhausted and daughter reports some disorientation.  He has 24/7 in home care. Dr LoCarollee Herterrescribed quetiapine 2560mt bedtime yesterday but patient has not started yet. Daughter wants to try melatonin first. Recommended start melatonin 5mg48m to 30 minutes prior to bedtime. May increase to up to 10mg105mbedtime if needed. If after 1 week no improvement in sleep, then start quetiapine 25mg 69medtime.  Subjective: [name] 89 y.o86year old male who is a primary patient of [name] The CCM team was consulted for assistance with disease management and care coordination needs.    Engaged with patient's daughter by telephone for follow up visit in response to provider referral for pharmacy case management and/or care coordination services.   Consent to Services:  The patient was given information about Chronic Care Management services, agreed to services, and gave verbal consent prior to initiation of services.  Please see initial visit note for detailed documentation.   Patient Care Team: Christian Christian HerterneAlferd Apas PCP - General (Family Medicine) Christian RobinsCPP (Pharmacist) [name],[name] Consulting Physician (Neurology)  Recent office visits: 12/13/2021 - Fam Med (Dr Christian-[name] Visit for insomnia. Wife recently decreased. Prescribed quetiapine 25mg a83mdtime.  11/08/2021 - Fam Med (Dr  Christian Herterfor dysphagia. Referred to GI. Labs checked.  10/11/2021 - Fam Med (Dr Christian Herterfor cough / bronchitis. Prescribed azithromycin. Referred for home health OT / PT; Ordered chest x-ray. 03/01/2021 - PCP (Dr Christian) [facility]. Changed losartan HCTZ to just losartan 50mg da8mdue to fatigue, low BP and incresaed urination. Low adhernece to thyroid replacement therapy. TSH was 13.57. Restarted levothyroxine 100mcg da63m  09/07/2020 - PCP (Dr Christian) acEtter Sjogrenisit for pansinusitis - prescribed doxycycline 100mg twic80mday for 10 days and fluticsone nasal spray   Recent consult visits: 11/24/2021 - GI ([place], [facility] dysphagia. Dysphagia worse after stroke. Ordered barium swallow test. Started famotidine 40mg daily48mnce on clopidogrel)  10/03/2021 - Cardio (Dr [name]) [facility] due to stenosis of right middle cerebral artery. Recommended 1 more month of ASA 81mg therap48mhen clopidogrel monotherpay indefinitely. Ordered 30 day heart monitor. ood pressure is elevated today.  However given his intracranial stenosis and frequent falls, will not be more aggressive.  Continue doxazosin.  When he sees neurology recommend getting a formal recommendation for his blood pressure goal.  Recent Hospital Visits:  12/03/2021 - ED Visit for Chest pain and SOB. Was visiting wife in hospital when he became emotional and clutched chest / had SOB. work up for cardiac  cause of CP was completed and negative. No med changes noted.   Objective:  Lab Results  Component Value Date   CREATININE 1.62 (H) 12/03/2021   CREATININE 1.48 11/08/2021   CREATININE 1.45 09/05/2021    Lab Results  Component Value Date   HGBA1C 5.8 (H) 06/29/2021   Last diabetic Eye  exam: No results found for: "HMDIABEYEEXA"  Last diabetic Foot exam: No results found for: "HMDIABFOOTEX"      Component Value Date/Time   CHOL 133 11/08/2021 1513   TRIG 387.0 (H) 11/08/2021 1513   HDL 33.30  (L) 11/08/2021 1513   CHOLHDL 4 11/08/2021 1513   VLDL 77.4 (H) 11/08/2021 1513   LDLCALC 102 (H) 06/29/2021 2154   LDLDIRECT 51.0 11/08/2021 1513       Latest Ref Rng & Units 11/08/2021    3:13 PM 09/05/2021   11:52 AM 07/08/2021    5:53 AM  Hepatic Function  Total Protein 6.0 - 8.3 g/dL 6.9  7.0  7.0   Albumin 3.5 - 5.2 g/dL 4.0  3.9  3.1   AST 0 - 37 U/L '20  27  19   ' ALT 0 - 53 U/L 20  36  15   Alk Phosphatase 39 - 117 U/L 72  74  62   Total Bilirubin 0.2 - 1.2 mg/dL 0.3  0.2  0.3     Lab Results  Component Value Date/Time   TSH 3.63 11/08/2021 03:13 PM   TSH 1.30 09/05/2021 11:52 AM       Latest Ref Rng & Units 12/03/2021    6:00 PM 11/08/2021    3:13 PM 08/27/2021    9:12 AM  CBC  WBC 4.0 - 10.5 K/uL 8.9  9.4  11.6   Hemoglobin 13.0 - 17.0 g/dL 12.7  13.1  13.6   Hematocrit 39.0 - 52.0 % 39.8  39.5  42.4   Platelets 150 - 400 K/uL 197  209.0  206     Lab Results  Component Value Date/Time   VD25OH 49.40 04/13/2020 02:11 PM   VD25OH 43 11/15/2009 09:25 PM    Clinical ASCVD: Yes  The ASCVD Risk score (Arnett DK, et al., 2019) failed to calculate for the following reasons:   The 2019 ASCVD risk score is only valid for ages 84 to 52   The patient has a prior MI or stroke diagnosis     Social History   Tobacco Use  Smoking Status Never   Passive exposure: Never  Smokeless Tobacco Never  Tobacco Comments   Verified by Daughter, [name]   BP Readings from Last 3 Encounters:  12/13/21 121/73  12/03/21 (!) 143/56  11/24/21 130/70   Pulse Readings from Last 3 Encounters:  12/03/21 65  11/24/21 72  11/08/21 74   Wt Readings from Last 3 Encounters:  12/03/21 123 lb 8 oz (56 kg)  11/24/21 123 lb 8 oz (56 kg)  10/03/21 126 lb 9.6 oz (57.4 kg)    Assessment: Review of patient past medical history, allergies, medications, health status, including review of consultants reports, laboratory and other test data, was performed as part of comprehensive  evaluation and provision of chronic care management services.   SDOH:  (Social Determinants of Health) assessments and interventions performed:      CCM Care Plan  Allergies  Allergen Reactions   Methotrexate Derivatives Other (See Comments)    Mouth sores / pancytopenia   Asa [Aspirin] Other (See Comments)    Gastric symptoms - can tolerate 81 mg aspirin   Penicillin G Rash    Did it involve swelling of the face/tongue/throat, SOB, or low BP? Unknown Did it involve sudden or severe rash/hives, skin peeling, or any reaction on the inside of your mouth or nose? Unknown Did you need to seek medical attention at a hospital or doctor's  office? Unknown When did it last happen?   unknown    If all above answers are "NO", may proceed with cephalosporin use.    Medications Reviewed Today     Reviewed by [name], RPH-CPP (Pharmacist) on 12/14/21 at 48  Med List Status: <None>   Medication Order Taking? Sig Documenting Provider Last Dose Status Informant  acetaminophen (TYLENOL) 500 MG tablet [id number] Yes Take 1 tablet (500 mg total) by mouth at bedtime. Flora Lipps Taking Active   aspirin EC 81 MG tablet [id number] Yes Take 81 mg by mouth daily. Swallow whole. [provider] Taking Active   atorvastatin (LIPITOR) 40 MG tablet [id number] Yes Take 1 tablet (40 mg total) by mouth daily. [name] R, DO Taking Active   capsicum (ZOSTRIX) 0.075 % topical cream [id number] Yes Apply 1 application. topically 4 (four) times daily -  with meals and at bedtime. Be sure to wear glove and do not touch eye, mouth or nose (any mucous membranes) Apply to left shoulder. Bary Leriche, PA-C Taking Active   clopidogrel (PLAVIX) 75 MG tablet [id number] No Take 1 tablet (75 mg total) by mouth daily. Please call office at [phone] to schedule appt with Dr. Leonie Man for continued refills  Patient not taking: Reported on 12/14/2021   Christian Fila, MD Not Taking Active    docusate sodium (COLACE) 100 MG capsule [id number] Yes Take 2 capsules (200 mg total) by mouth daily. Bary Leriche, PA-C Taking Active   doxazosin (CARDURA) 1 MG tablet [id number] Yes Take 1 tablet (1 mg total) by mouth at bedtime. Skeet Latch, MD Taking Active   famotidine (PEPCID) 40 MG tablet [id number] Yes Take 1 tablet (40 mg total) by mouth daily. Vladimir Crofts, PA-C Taking Active   levothyroxine (SYNTHROID) 100 MCG tablet [id number] Yes TAKE 1 TABLET BY MOUTH DAILY BEFORE BREAKFAST. [name] R, DO Taking Active   melatonin 5 MG TABS [id number] Yes Take 5 mg by mouth at bedtime. [provider] Taking Active   mirabegron ER (MYRBETRIQ) 25 MG TB24 tablet [id number] Yes Take 25 mg by mouth at bedtime. [provider] Taking Active Child  Multiple Vitamins-Minerals (MULTIVITAMIN ADULT) CHEW [id number] Yes Chew 2 each by mouth daily. [provider] Taking Active Child  NONFORMULARY OR COMPOUNDED ITEM [id number] Yes 4 wheeled walker   #1  dx frequent falls, weakness [name], Christian Apa, DO Taking Active Child  QUEtiapine (SEROQUEL) 25 MG tablet [id number] No Take 1 tablet (25 mg total) by mouth at bedtime.  Patient not taking: Reported on 12/14/2021   Christian Held, DO Not Taking Active   Med List Note [name], CPhT 06/29/21 0206): Resident is independent living at [facility]            Patient Active Problem List   Diagnosis Date Noted   Requires assistance with activities of daily living (ADL) 10/11/2021   Bronchitis 10/11/2021   Trapezium fracture 09/09/2021   CMC arthritis, thumb, degenerative 09/09/2021   Left wrist pain 09/05/2021   Radial nerve palsy, left 09/05/2021   Acute shoulder pain due to trauma, left 07/29/2021   Slow transit constipation    Stroke (cerebrum) ([place]) 07/07/2021   CVA (cerebral vascular accident) [facility]) with intracranial vascular stenosis 06/29/2021   Intracranial vascular stenosis 06/29/2021    Prediabetes 06/29/2021   Normocytic anemia 06/29/2021   History of stroke 06/29/2021   Other drug-induced neutropenia ([place]) 06/09/2021   Malignant neoplasm of  ascending colon ([place]) 06/09/2021   Stenosis of artery (HCC) 06/09/2021   Preventative health care 03/01/2021   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   Kidney disease, chronic, stage III (moderate, EGFR 30-59 ml/min) (HCC) 08/07/2020   Leukopenia 08/07/2020   Acute respiratory failure with hypoxia ([place]) 08/07/2020   SIRS (systemic inflammatory response syndrome) ([place]) 08/07/2020   Methotrexate adverse reaction, initial encounter    Acute pain of right shoulder 04/14/2020   Lower extremity edema 11/13/2019   Head trauma 11/13/2019   Dyslipidemia 11/13/2019   Memory loss 07/28/2019   Chronic right shoulder pain 07/28/2019   Frequent falls 07/28/2019   Balance problem 07/28/2019   Dysphagia 07/28/2019   Traumatic hemorrhage of cerebrum, unspecified, without loss of consciousness, initial encounter ([place])    Basilar artery stenosis with infarction ([place]) 12/18/2017   TIA (transient ischemic attack) 12/10/2017   Rheumatoid arthritis ([place])    Chest pain 06/02/2011   General medical examination 04/11/2011   PEPTIC ULCER DISEASE, HELICOBACTER PYLORI POSITIVE 07/12/2010   PERSONAL HISTORY MALIG NEOPLASM LARGE INTESTINE 05/18/2010   MITRAL VALVE DISORDERS 04/05/2009   HOARSENESS 12/04/2008   OSTEOPENIA 08/26/2008   HYPERTRIGLYCERIDEMIA 07/23/2008   EPISTAXIS 07/26/2007   ERECTILE DYSFUNCTION 02/22/2007   ECZEMA 12/07/2006   LEG CRAMPS 10/09/2006   Hypothyroidism 08/28/2006   Essential hypertension 08/28/2006   HAY FEVER 08/28/2006   INSOMNIA 08/28/2006    Immunization History  Administered Date(s) Administered   Fluad Quad(high Dose 65+) 04/13/2020   Influenza Split 02/21/2011, 02/27/2012, 02/20/2014   Influenza Whole 04/03/2007, 02/26/2008, 02/15/2009, 02/01/2010   Influenza, High Dose Seasonal PF 01/28/2016, 02/13/2019    Influenza,inj,Quad PF,6+ Mos 02/20/2014   Influenza-Unspecified 02/21/2011, 02/27/2012, 02/20/2014   PFIZER Comirnaty(Gray Top)Covid-19 Tri-Sucrose Vaccine 08/24/2020   PFIZER(Purple Top)SARS-COV-2 Vaccination 05/31/2019, 06/21/2019, 02/20/2020   Pfizer Covid-19 Vaccine Bivalent Booster 35yr & up 05/05/2021   Pneumococcal Conjugate-13 07/23/2008, 02/11/2015   Pneumococcal Polysaccharide-23 07/23/2008   Td 11/15/2009   Td,absorbed, Preservative Free, Adult Use, Lf Unspecified 11/15/2009   Tdap 03/09/2019    Conditions to be addressed/monitored: CAD, HTN, HLD, Hypertriglyceridemia, CKD Stage 3, Hypothyroidism, and Osteopenia  Care Plan : General Pharmacy (Adult)  Updates made by ECherre Lindsey RPH-CPP since 12/14/2021 12:00 AM     Problem: HTN; mixed hyperlipidemia; history of TIA; edema; hypothyroidism; memory loss; balance issues; osteopenia; anemia; RA; dyspepsia; colon CA   Priority: High  Onset Date: 03/07/2021     Long-Range Goal: Pharmacy Care Plan for Chronic Care Management   Start Date: 03/07/2021  Expected End Date: 08/24/2021  Recent Progress: On track  Priority: High  Note:   Current Barriers:  Unable to achieve control of hyperlipidemia and hypothyroidism  Unable to self administer medications as prescribed Does not adhere to prescribed medication regimen  Pharmacist Clinical Goal(s):  Over the next 90 days, patient will achieve adherence to monitoring guidelines and medication adherence to achieve therapeutic efficacy achieve control of hypothyroidism and hyperlipidemia as evidenced by TSH in therapeutic range and LDL <70 achieve ability to self administer medications as prescribed through use of weekly medication container with reminders as evidenced by patient report through collaboration with PharmD and provider.   Interventions: 1:1 collaboration with LCarollee Lindsey YAlferd Apa DO regarding development and update of comprehensive plan of care as evidenced by  provider attestation and co-signature Inter-disciplinary care team collaboration (see longitudinal plan of care) Comprehensive medication review performed; medication list updated in electronic medical record  Hypertension / BPH: Controlled; blood pressure goal <130/80 Current treatment: Doxazosin 162mdaily at  beditme Denies hypotensive/hypertensive symptoms Interventions:  Discussed blood pressure goals Discussed limiting intake of sodium for blood pressure and edema  Hyperlipidemia, mixed / Recent CVA: LDL not at goal but Tg at goal; LDL goal <70 and Triglyceride goal <150 CVA 07/07/2021 and TIA noted 11/2017 Current treatment:  Aspirin 50m daily Clopidogrel 714mdaily (indefinitely) Atorvastatin 4015maily  Took tricagrelor in past after TIA Per daughter 12/14/2021 - stopped clopidogrel but still taking aspirin 26m69mily. Discussed recommendations after CVA was to continue clopidogrel and stopped aspirin around 11/03/2021 Interventions: Discussed lipid goals Discussed antiplatelet recommendations per Dr RandBlenda Mountst note- should have continued clopidogrel and stopped aspirin. Patient has appointment with neurologist and will verify clopidogrel and aspirin doses. Continue to take clopidogrel 75mg68mly.   Hypothyroidism:  Controlled; Goal: TSH in therapeutic range Current treatment:  Levothyroxine 100mcg40me 1 tablet each morning Interventions: Discussed symptoms of low thyroid Discussed importance of taking levothyroxine daily   Osteopenia with low vitamin D:  Last DEXA 2012 showed T-Score of -1.8 at neck of right femur Noted to have frequent falls due to balance but no fractures Current therapy:  Multivitamin daily  Previous therapies: alendronate, vitamin D supplement, calcium supplement (stopped due to constipation) Has history of low vitamin D but last serum vitamin D was 49.4 (04/13/2020) Interventions: (addressed at previous visit)  Consider rechecking serum  vitamin D with next labs Could consider rechecking DEXA if would change treatment plan  Fall prevention   SDOH:  Patient's wife died last week 12/08/06/03/2023ent is having difficulty sleeping. Dr Christian-Christian Herterribed quetiapine 25mg a39mdtime yesterday. Patient has not started yet. Daughter want to try melatonin first. Taking 5mg at 57mtime Has 24/7 in home care givers currently.  Interventions:  . Discussed expectation of melatonin on sleep. Start with 5mg at b58mime. Can increase up to 10mg at b36mme if needed. If after 1 week sleep is not improved, then would recommended starting quetiapine.   Medication management Pharmacist Clinical Goal(s): Over the next 90 days, patient will work with PharmD and providers to maintain optimal medication adherence Current pharmacy[facility] Interventions Comprehensive medication review performed. Updated medication list  Reviewed refill history and assessed adherence Continue current medication management strategy Patient self care activities - Over the next 90 days, patient will: Focus on medication adherence by filling medications appropriately  Purchase weekly pill container with alarms / reminders Take medications as prescribed Report any questions or concerns to PharmD and/or provider(s)   Patient Goals/Self-Care Activities Over the next 90 days, patient will:  Take medications as prescribed, Focus on medication adherence by using weekly pill container with alarms Discuss with neurologist tomorrow recommendation for aspirin and clopidogrel.  Contact provider or clinical pharmacist with medication questions.   Follow Up Plan: Telephone follow up appointment with care management team member scheduled for:  1 to 2 months.          Medication Assistance: None required.  Patient affirms current coverage meets needs.  Patient's preferred pharmacy is:  [facility], [address] [facility] [phone] #[id number] - [id number],[phone] #[id number] - [id number], [place] - [address]:[phone]? Yes Daughter endorses 85 to 90% compliance but she cannot  be sure  Follow Up:  Patient agrees to Care Plan and Follow-up.  Plan: Telephone follow up appointment with care management team member scheduled for:  1 to 2 months  [name], PharmD Clinical Pharmacist [place] [facility]

## 2021-12-15 ENCOUNTER — Encounter: Payer: Self-pay | Admitting: Physical Medicine & Rehabilitation

## 2021-12-15 ENCOUNTER — Encounter: Payer: Medicare Other | Attending: Physical Medicine & Rehabilitation | Admitting: Physical Medicine & Rehabilitation

## 2021-12-15 ENCOUNTER — Encounter: Payer: Self-pay | Admitting: Neurology

## 2021-12-15 ENCOUNTER — Ambulatory Visit (INDEPENDENT_AMBULATORY_CARE_PROVIDER_SITE_OTHER): Payer: Medicare Other | Admitting: Neurology

## 2021-12-15 VITALS — BP 132/77 | HR 58 | Ht 59.0 in | Wt 123.0 lb

## 2021-12-15 VITALS — BP 145/77 | HR 62 | Ht 59.0 in | Wt 123.0 lb

## 2021-12-15 DIAGNOSIS — I6381 Other cerebral infarction due to occlusion or stenosis of small artery: Secondary | ICD-10-CM

## 2021-12-15 DIAGNOSIS — I639 Cerebral infarction, unspecified: Secondary | ICD-10-CM | POA: Insufficient documentation

## 2021-12-15 DIAGNOSIS — I672 Cerebral atherosclerosis: Secondary | ICD-10-CM | POA: Diagnosis not present

## 2021-12-15 DIAGNOSIS — R413 Other amnesia: Secondary | ICD-10-CM | POA: Diagnosis not present

## 2021-12-15 DIAGNOSIS — F01A0 Vascular dementia, mild, without behavioral disturbance, psychotic disturbance, mood disturbance, and anxiety: Secondary | ICD-10-CM | POA: Diagnosis not present

## 2021-12-15 MED ORDER — MEMANTINE HCL 28 X 5 MG & 21 X 10 MG PO TABS: ORAL_TABLET | ORAL | 12 refills | Status: DC

## 2021-12-15 MED ORDER — MEMANTINE HCL 10 MG PO TABS: 10.0000 mg | ORAL_TABLET | Freq: Two times a day (BID) | ORAL | 3 refills | Status: AC

## 2021-12-15 NOTE — Progress Notes (Signed)
[facility] [address] [place] [address] [phone]       OFFICE FOLLOW UP NOTE  [name] Date of Birth:  Nov 01, 1928 Medical Record Number:  [id number]   Reason for visit: f/u stroke 11/2017  CHIEF COMPLAINT:  Chief Complaint  Patient presents with   New Patient (Initial Visit)    Rm 95, with friend and caretaker     HPI: Stroke admission 12/10/2017: [name] is a 88 y.o. [name] with history of HTN and colon cancer who presented with intermittent L hemiparesis that worsens with dizziness for the past several weeks.  CT head reviewed and was negative for acute infarct.  CTA head and neck was negative for LVO but did show severe VB disease (LVA near occlusion, hypoplastic right V4 occludes prior to the VBJ, BA narrow, PCAs perfused, robust right PCA, V1 arthrosclerosis) along with additional mild arthrosclerosis.  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  2D echo showed an EF of 60 to 65%.  LDL 49 and A1c 6.3.  Aspirin 81 PTA and recommended DAPT for 3 months and Plavix alone.  Discussion between Dr. Willaim Rayas and daughter regarding elective angioplasty/stenting with Dr. Estanislado Pandy the following week if stable or sooner if recurrent symptoms.  Therapies recommended home health PT and was discharged home with family in stable condition. Patient presented to [facility] on 12/18/2017 for cerebral angiogram with distal vertebrobasilar junction/proximal basilar artery stenosis angioplasty with Dr. Estanislado Pandy.  Procedure completed without major complications and recommended Brilinta 90 mg twice daily along with aspirin 81 mg daily and was discharged home with recommendations to follow-up with Dr. Estanislado Pandy in 2 weeks time.  Patient did follow-up in Dr. Arlean Hopping office on 01/07/2018 and per notes, continued to do well post procedure.  Recommended follow-up  with CTA head/neck 3 to 4 months post procedure along with continuation of Brilinta 90 mg twice daily and aspirin 81 mg daily.  Initial visit 03/03/2018: Patient states overall he has been doing well without residual deficits or recurring of symptoms.  He does endorse slight short-term memory concerns since his stroke but has been greatly improving.  He has continued to take both the aspirin and Brilinta without side effects of bleeding or bruising.  Continues to take Lopid for HLD management.  Blood pressure today 107/55.  He is questioning whether he can begin driving again at this time.  He continues to live at home with his wife where they received aid services twice weekly to assist with cleaning or any other needed assistance.  No further concerns at this time.  Denies new or worsening stroke/TIA symptoms.  Update 02/27/2019: [name] is being seen today for stroke follow-up accompanied by his daughter and interpreter.  Recommended sooner follow-up after prior visit but no-show to scheduled visit.  He returns today and has been stable from a stroke standpoint.  Daughter endorses slow decline of memory such as being more forgetful and more issues with short-term memory.  He does not have issues with maintaining ADLs from a cognitive standpoint.  He continues to live with his wife.  Previously had aide services but due to COVID-19, they put the services on hold therefore daughter has been visiting more from [place], [place].  Daughter states that she reviewed his medication box which did not have Lopid or Brilinta.  Daughter believes these were last taken in July.  He has  continued on aspirin 81 mg without bleeding or bruising.  He has not had follow-up with vascular surgery despite multiple attempts from vascular surgery office to schedule visit per epic review regarding follow-up of angioplasty with stent placement in 11/2017.  PCP recently restarted Lopid for HLD management.  Lab work obtained which showed  elevated TSH therefore medications adjusted.  Blood pressure today 126/58.  No further concerns at this time.  Update 12/15/2021 : He returns for follow-up after last visit in office nearly 3 years ago.  He is accompanied by his caregiver and [place] language interpreter.  I also spoke to his daughter over the phone.  Patient was seen in the ER multiple times in early February 2023 for multiple falls at home at his assisted living facility.  Following 1 such fall the daughter noticed that he was dragging his left foot and was unable to use his walker with his left hand.  MRI scan was obtained on 06/28/2021 which showed acute small right corona radiata infarct and MRI showed diffuse intracranial atherosclerotic changes with right M1 segment high-grade stenosis.  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  Echocardiogram showed ejection fraction of 60 to 65%.  LDL cholesterol was elevated 102 mg percent and hemoglobin A1c was 5.8.  Patient was placed on aspirin and Plavix for 3 months followed by Plavix alone and recommended 30-day outpatient heart monitor.  Patient is currently living at home with 24-hour caregivers around-the-clock.  He still had 2 recent falls 3 weeks ago as well as 2 days ago.  EMS was called with did not have any serious injuries so did not go to the hospital.  He is able to walk with a walker but requires 1 person assist and somebody needs to be close by.  His balance is still quite poor.  He is currently getting home physical and Occupational Therapy.  The daughter has also noticed that he is confused at times and is having mood swings and he is had noticeable loss of using [place] language and he often slips into [place] and gets frustrated when the caregivers cannot understand him.  His wife also died recently and patient at times forgets this fact and calls out to her and gets worried if she is not there.  Patient has not had any evaluation for reversible  causes of memory loss or tried medications like Aricept or Namenda yet.  ROS:   14 system review of systems performed and negative with exception of memory loss  PMH:  Past Medical History:  Diagnosis Date   Adenocarcinoma ([place]) 1985   colon s/p right hemicolectomy and chemotherapy. F/U with cancer center and Dr. Lucia Gaskins rotinely   Depression    Eczema    Epistaxis    f/u per ENT   Erectile dysfunction    Gastric ulcer 12/11   H-Pylori Tx, EGD 3-12: gastritis   Hay fever    Head injury 11/2017   with fall   Hyperlipemia    Hypertension    Hypothyroidism    Hypothyroid   Insomnia    Osteopenia     per DEXA 07-2008 (Rx fosamax)   Rheumatoid arthritis ([place])    Stroke ([place])     " balance issue"    PSH:  Past Surgical History:  Procedure Laterality Date   COLON SURGERY     Right / Adenocarcinoma / Chemo   COLONOSCOPY  2005, 2008   Dr. Lucia Gaskins   INGUINAL HERNIA REPAIR  Right   IR ANGIO INTRA EXTRACRAN SEL COM CAROTID INNOMINATE BILAT MOD SED  12/13/2017   IR ANGIO VERTEBRAL SEL VERTEBRAL BILAT MOD SED  12/13/2017   IR PTA INTRACRANIAL  12/18/2017   RADIOLOGY WITH ANESTHESIA N/A 12/18/2017   Procedure: Angioplasty with stenting;  Surgeon: Luanne Bras, MD;  Location: [facility];  Service: Radiology;  Laterality: N/A;    Social History:  Social History   Socioeconomic History   Marital status: Widowed    Spouse name: Not on file   Number of children: 2   Years of education: 12th   Highest education level: 12th grade  Occupational History    Employer: RETIRED  Tobacco Use   Smoking status: Never    Passive exposure: Never   Smokeless tobacco: Never   Tobacco comments:    Verified by Daughter, [name]  Vaping Use   Vaping Use: Never used  Substance and Sexual Activity   Alcohol use: No    Alcohol/week: 0.0 standard drinks of alcohol   Drug use: No   Sexual activity: Not Currently  Other Topics Concern   Not on file  Social History Narrative    Original from [place]   Vegetarian   Daily caffeine use one per day   Social Determinants of Health   Financial Resource Strain: Low Risk  (11/16/2021)   Overall Financial Resource Strain (CARDIA)    Difficulty of Paying Living Expenses: Not hard at all  Food Insecurity: No Food Insecurity (11/16/2021)   Hunger Vital Sign    Worried About Running Out of Food in the Last Year: Never true    Ran Out of Food in the Last Year: Never true  Transportation Needs: No Transportation Needs (11/16/2021)   PRAPARE - Hydrologist (Medical): No    Lack of Transportation (Non-Medical): No  Physical Activity: Insufficiently Active (11/16/2021)   Exercise Vital Sign    Days of Exercise per Week: 2 days    Minutes of Exercise per Session: 50 min  Stress: No Stress Concern Present (11/16/2021)   [place]    Feeling of Stress : Not at all  Social Connections: Moderately Isolated (11/16/2021)   Social Connection and Isolation Panel [NHANES]    Frequency of Communication with Friends and Family: More than three times a week    Frequency of Social Gatherings with Friends and Family: More than three times a week    Attends Religious Services: More than 4 times per year    Active Member of [employer] or Organizations: No    Attends Archivist Meetings: Never    Marital Status: Widowed  Intimate Partner Violence: Not At Risk (11/16/2021)   Humiliation, Afraid, Rape, and Kick questionnaire    Fear of Current or Ex-Partner: No    Emotionally Abused: No    Physically Abused: No    Sexually Abused: No    Family History:  Family History  Problem Relation Age of Onset   Diabetes Neg Hx    Heart attack Neg Hx    Colon cancer Neg Hx    Prostate cancer Neg Hx     Medications:   Current Outpatient Medications on File Prior to Visit  Medication Sig Dispense Refill   acetaminophen (TYLENOL) 500 MG tablet Take  1 tablet (500 mg total) by mouth at bedtime. 100 tablet 0   aspirin EC 81 MG tablet Take 81 mg by mouth daily. Swallow whole.  atorvastatin (LIPITOR) 40 MG tablet Take 1 tablet (40 mg total) by mouth daily. 90 tablet 1   capsicum (ZOSTRIX) 0.075 % topical cream Apply 1 application. topically 4 (four) times daily -  with meals and at bedtime. Be sure to wear glove and do not touch eye, mouth or nose (any mucous membranes) Apply to left shoulder. 57 g 0   docusate sodium (COLACE) 100 MG capsule Take 2 capsules (200 mg total) by mouth daily. 60 capsule 0   doxazosin (CARDURA) 1 MG tablet Take 1 tablet (1 mg total) by mouth at bedtime. 90 tablet 3   famotidine (PEPCID) 40 MG tablet Take 1 tablet (40 mg total) by mouth daily. 90 tablet 0   levothyroxine (SYNTHROID) 100 MCG tablet TAKE 1 TABLET BY MOUTH DAILY BEFORE BREAKFAST. 90 tablet 1   melatonin 5 MG TABS Take 5 mg by mouth at bedtime.     mirabegron ER (MYRBETRIQ) 25 MG TB24 tablet Take 25 mg by mouth at bedtime.     Multiple Vitamins-Minerals (MULTIVITAMIN ADULT) CHEW Chew 2 each by mouth daily.     NONFORMULARY OR COMPOUNDED ITEM 4 wheeled walker   #1  dx frequent falls, weakness 1 each 0   QUEtiapine (SEROQUEL) 25 MG tablet Take 1 tablet (25 mg total) by mouth at bedtime. 30 tablet 1   No current facility-administered medications on file prior to visit.    Allergies:   Allergies  Allergen Reactions   Methotrexate Derivatives Other (See Comments)    Mouth sores / pancytopenia   Asa [Aspirin] Other (See Comments)    Gastric symptoms - can tolerate 81 mg aspirin   Penicillin G Rash    Did it involve swelling of the face/tongue/throat, SOB, or low BP? Unknown Did it involve sudden or severe rash/hives, skin peeling, or any reaction on the inside of your mouth or nose? Unknown Did you need to seek medical attention at a hospital or doctor's office? Unknown When did it last happen?   unknown    If all above answers are "NO", may proceed  with cephalosporin use.     Physical Exam  Vitals:   12/15/21 0822  BP: (!) 145/77  Pulse: 62  Weight: 123 lb (55.8 kg)  Height: '4\' 11"'$  (1.499 m)   Body mass index is 24.84 kg/m. No results found.  General: well developed, well nourished, pleasant [place] elderly male, seated, in no evident distress Head: head normocephalic and atraumatic.   Neck: supple with no carotid or supraclavicular bruits Cardiovascular: regular rate and rhythm, no murmurs Musculoskeletal: no deformity Skin:  no rash/petichiae Vascular:  Normal pulses all extremities  Neurologic Exam Mental Status: Awake and fully alert. Oriented to place and person but not to time.  Recent memory diminished and remote memory intact. Attention span, concentration and fund of knowledge appears slightly diminished but limited assessment due to language barrier and majority of history provided by daughter. Mood and affect appropriate.  Mini-Mental status exam not done due to language barrier Cranial Nerves: Pupils equal, briskly reactive to light. Extraocular movements full without nystagmus. Visual fields full to confrontation. Hearing intact. Facial sensation intact. Face, tongue, palate moves normally and symmetrically.  Motor: Normal bulk and tone. Normal strength in all tested extremity muscles.  Except mild weakness of left grip and intrinsic hand muscles.  Orbits right over left upper extremity.  Trace weakness of left hip flexors and ankle dorsiflexors only. Sensory.: intact to touch , pinprick , position and vibratory sensation.  Coordination:  Rapid alternating movements normal in all extremities. Finger-to-nose and heel-to-shin performed accurately bilaterally. Gait and Station: Deferred as patient is in a wheelchair and did not bring his walker.   Reflexes: 1+ and symmetric. Toes downgoing.       Diagnostic Data (Labs, Imaging, Testing)  CT HEAD WO CONTRAST 12/10/2017 IMPRESSION: No acute intracranial  abnormality. Atrophy, chronic microvascular disease.  MR BRAIN WO CONTRAST 12/11/2017 IMPRESSION: 1. 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 No associated hemorrhage or mass effect. 2. Chronic supratentorial signal changes suggestive of small vessel disease have mildly progressed since 2014.  CT ANGIO HEAD W OR WO CONTRAST CT ANGIO NECK W OR WO CONTRAST 12/11/2017 IMPRESSION: 1. Negative CTA for large vessel occlusion. 2. 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 Hypoplastic right V4 segment occludes prior to the vertebrobasilar junction. Basilar artery diffusely narrowed with multifocal atheromatous irregularity. PCAs are well perfused, with predominant fetal type origin of the left PCA, with a fairly robust right posterior communicating artery as well. 3. Additional moderate to advanced stenoses involving the proximal V1 segments as above. 4. Additional mild for age atherosclerotic change about the carotid bifurcations and carotid siphons without hemodynamically significant stenosis.  ECHOCARDIOGRAM 12/11/2017 Study Conclusions - Left ventricle: The cavity size was normal. There was mild focal   basal hypertrophy of the septum. Systolic function was normal.   The estimated ejection fraction was in the range of 60% to 65%.   Wall motion was normal; there were no regional wall motion   abnormalities. Doppler parameters are consistent with abnormal   left ventricular relaxation (grade 1 diastolic dysfunction).   Doppler parameters are consistent with high ventricular filling   pressure. - Aortic valve: Transvalvular velocity was within the normal range.   There was no stenosis. There was mild regurgitation. Valve area   (VTI): 2.22 cm^2. Valve area (Vmean): 1.94 cm^2. - Mitral valve: Transvalvular velocity was within the normal range.   There was no  evidence for stenosis. There was trivial   regurgitation. - Right ventricle: The cavity size was normal. Wall thickness was   normal. Systolic function was normal. - Atrial septum: No defect or patent foramen ovale was identified   by color flow Doppler. - Tricuspid valve: There was mild regurgitation. - Pulmonary arteries: Systolic pressure was within the normal   range. PA peak pressure: 23 mm Hg (S).  CEREBRAL ANGIOGRAM 12/13/2017 IMPRESSION: Severe pre occlusive 95% plus stenosis of the dominant left vertebrobasilar junction, and of 70% of the proximal basilar artery. Approximately 30% stenosis of the dominant left vertebral artery proximally. Approximately 50% stenosis of the non dominant right vertebral artery proximally. Right non dominant vertebral artery terminates in the right posterior-inferior cerebellar artery.  LEFT VERTEBROBASILAR ANGIOPLASTY PROXIMAL BASILAR ARTERY ANGIOPLASTY 12/18/2017 IMPRESSION: Status post endovascular revascularization of near complete occlusion of the dominant left vertebrobasilar junction, and of the proximal basilar artery with balloon angioplasty as described above achieving a 50-60% patency at both angioplasty sites.      ASSESSMENT: [name] is a 88 y.o. 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 Vascular risk factors include HTN and HLD.  Patient underwent left vertebrobasilar angioplasty and proximal basilar artery angioplasty with stent placement with Dr. Estanislado Pandy on 12/18/2017.  Recent right subcortical lacunar infarct in February 2023 with mild left hemiparesis  and neurovascular imaging showing progressive intracranial atherosclerosis.  Residual deficits of short-term memory loss with slow decline now due to vascular dementia per daughter but otherwise stable from a stroke  standpoint.    PLAN: I had a long discussion with the patient, his [place] language interpreter, caregiver and also spoke to the daughter over the phone about his recent lacunar stroke and progressive intracranial atherosclerosis and prior strokes and recent cognitive worsening likely representing mild vascular dementia and answered questions.  I recommend further evaluation by looking for reversible causes of memory loss by checking CMP, CBC, UA, memory panel labs and EEG.  Trial of Namenda starter pack and if tolerated then 10 mg twice daily.  I discussed possible side effects with the patient's daughter and answered questions.  He will stay on aspirin 81 mg daily for stroke prevention and maintain aggressive risk factor modification strict control of hypertension with blood pressure goal below 130/90, cholesterol with LDL goal below 70 mg percent and diabetes with hemoglobin A1c goal below 6.5%.  He was encouraged to use his walker at all times and we discussed fall prevention precautions as well.  Return for follow-up in the future in 3 months with my nurse practitioner call earlier if necessary. Greater than 50% of time during this 45 minute prolonged visit was spent on counseling,explanation of diagnosis of bilateral cerebellar and right pontine infarcts, vascular dementiay, discussion regarding short-term memory loss, reviewing risk factor management of HLD and HTN, planning of further management, discussion with patient and family and coordination of care    Antony Contras, MD  [facility] [address] [place], [place] [address]  Phone [phone] Fax [phone] Note: This document was prepared with digital dictation and possible smart phrase technology. Any transcriptional errors that result from this process are unintentional.

## 2021-12-15 NOTE — Patient Instructions (Signed)
I had a long discussion with the patient, his [place] language interpreter, caregiver and also spoke to the daughter over the phone about his recent lacunar stroke and progressive intracranial atherosclerosis and prior strokes and recent cognitive worsening likely representing mild vascular dementia and answered questions.  I recommend further evaluation by looking for reversible causes of memory loss by checking CMP, CBC, UA, memory panel labs and EEG.  Trial of Namenda starter pack and if tolerated then 10 mg twice daily.  I discussed possible side effects with the patient's daughter and answered questions.  He will stay on aspirin 81 mg daily for stroke prevention and maintain aggressive risk factor modification strict control of hypertension with blood pressure goal below 130/90, cholesterol with LDL goal below 70 mg percent and diabetes with hemoglobin A1c goal below 6.5%.  He was encouraged to use his walker at all times and we discussed fall prevention precautions as well.  Return for follow-up in the future in 3 months with my nurse practitioner call earlier if necessary. Fall Prevention in the Home, Adult Falls can cause injuries and can happen to people of all ages. There are many things you can do to make your home safe and to help prevent falls. Ask for help when making these changes. What actions can I take to prevent falls? General Instructions Use good lighting in all rooms. Replace any light bulbs that burn out. Turn on the lights in dark areas. Use night-lights. Keep items that you use often in easy-to-reach places. Lower the shelves around your home if needed. Set up your furniture so you have a clear path. Avoid moving your furniture around. Do not have throw rugs or other things on the floor that can make you trip. Avoid walking on wet floors. If any of your floors are uneven, fix them. Add color or contrast paint or tape to clearly mark and help you see: Grab bars or  handrails. First and last steps of staircases. Where the edge of each step is. If you use a stepladder: Make sure that it is fully opened. Do not climb a closed stepladder. Make sure the sides of the stepladder are locked in place. Ask someone to hold the stepladder while you use it. Know where your pets are when moving through your home. What can I do in the bathroom?     Keep the floor dry. Clean up any water on the floor right away. Remove soap buildup in the tub or shower. Use nonskid mats or decals on the floor of the tub or shower. Attach bath mats securely with double-sided, nonslip rug tape. If you need to sit down in the shower, use a plastic, nonslip stool. Install grab bars by the toilet and in the tub and shower. Do not use towel bars as grab bars. What can I do in the bedroom? Make sure that you have a light by your bed that is easy to reach. Do not use any sheets or blankets for your bed that hang to the floor. Have a firm chair with side arms that you can use for support when you get dressed. What can I do in the kitchen? Clean up any spills right away. If you need to reach something above you, use a step stool with a grab bar. Keep electrical cords out of the way. Do not use floor polish or wax that makes floors slippery. What can I do with my stairs? Do not leave any items on the stairs. Make sure  that you have a light switch at the top and the bottom of the stairs. Make sure that there are handrails on both sides of the stairs. Fix handrails that are broken or loose. Install nonslip stair treads on all your stairs. Avoid having throw rugs at the top or bottom of the stairs. Choose a carpet that does not hide the edge of the steps on the stairs. Check carpeting to make sure that it is firmly attached to the stairs. Fix carpet that is loose or worn. What can I do on the outside of my home? Use bright outdoor lighting. Fix the edges of walkways and driveways and  fix any cracks. Remove anything that might make you trip as you walk through a door, such as a raised step or threshold. Trim any bushes or trees on paths to your home. Check to see if handrails are loose or broken and that both sides of all steps have handrails. Install guardrails along the edges of any raised decks and porches. Clear paths of anything that can make you trip, such as tools or rocks. Have leaves, snow, or ice cleared regularly. Use sand or salt on paths during winter. Clean up any spills in your garage right away. This includes grease or oil spills. What other actions can I take? Wear shoes that: Have a low heel. Do not wear high heels. Have rubber bottoms. Feel good on your feet and fit well. Are closed at the toe. Do not wear open-toe sandals. Use tools that help you move around if needed. These include: Canes. Walkers. Scooters. Crutches. Review your medicines with your doctor. Some medicines can make you feel dizzy. This can increase your chance of falling. Ask your doctor what else you can do to help prevent falls. Where to find more information Centers for Disease Control and Prevention, [employer]: http://www.wolf.info/ National Institute on Aging: http://kim-miller.com/ Contact a doctor if: You are afraid of falling at home. You feel weak, drowsy, or dizzy at home. You fall at home. Summary There are many simple things that you can do to make your home safe and to help prevent falls. Ways to make your home safe include removing things that can make you trip and installing grab bars in the bathroom. Ask for help when making these changes in your home. This information is not intended to replace advice given to you by your health care provider. Make sure you discuss any questions you have with your health care provider. Document Revised: 02/07/2021 Document Reviewed: 12/10/2019 Elsevier Patient Education  [place].

## 2021-12-15 NOTE — Progress Notes (Signed)
Subjective:    Patient ID: [name], male    DOB: 17-Oct-1928, 88 y.o.   MRN: [id number] 88 y.o. male with history of HTN, colon cancer, gait disorder with frequent falls, prior CVA 07/19, CKD III who was admitted on 06/30/2021 with reports of recurrent fall as well as left-sided weakness of few days duration.  MRI/MRI brain done revealing small acute perforated infarct in the right corona radiata and right M1 high-grade stenosis.  Dr. Erlinda Hong felt the stroke was due to small vessel disease but question embolic phenomena causing new high-grade M1 stenosis.  He recommended 30-day event monitor after discharge to rule out A-fib.  Aspirin was increased to 325 mg with Plavix added and recommendations are to continue DAPT x3 months followed by Plavix alone.  Therapy was initiated and patient was noted to be limited by left-sided weakness with ataxia, balance deficits as well as expressive deficits with delay in processing.   Admit date: 07/07/2021 Discharge date: 07/29/2021  HPI Patient lives in his own home, wife recently passed away Patient continues in physical therapy.  Review of therapy notes, functional status not appreciably changed compared to discharge status from inpatient rehab Daughter arranges 24/7 care  2 falls recently no sig injuries ,  gets up suddenly  Has bilateral neck and shoulder pain but not severe.  Uses laxatives and has 4 BMs per week Has frequent urination, does not require catheter Walking tolerance 2 minutes with a walker and assistance.  Uses a wheelchair outside the home.  He does not climb steps he does not drive he needs help with meal prep household duties shopping bathing toileting dressing and feeding.  Lives in apartment Pain Inventory Average Pain 3 Pain Right Now 0 My pain is aching  LOCATION OF PAIN  bilateral shoulder, neck  BOWEL Number of stools per week: 4 Oral laxative use Yes  Type of laxative pills Enema or suppository use No  History of colostomy No   Incontinent No   BLADDER Normal In and out cath, frequency . Able to self cath  . Bladder incontinence No  Frequent urination Yes  Leakage with coughing No  Difficulty starting stream No  Incomplete bladder emptying No    Mobility use a walker how many minutes can you walk? 3 ability to climb steps?  no do you drive?  no use a wheelchair needs help with transfers  Function retired I need assistance with the following:  feeding, dressing, bathing, toileting, meal prep, household duties, and shopping  Neuro/Psych weakness trouble walking confusion  Prior Studies Any changes since last visit?  no  Physicians involved in your care Neurologist .   Family History  Problem Relation Age of Onset   Diabetes Neg Hx    Heart attack Neg Hx    Colon cancer Neg Hx    Prostate cancer Neg Hx    Social History   Socioeconomic History   Marital status: Widowed    Spouse name: Not on file   Number of children: 2   Years of education: 12th   Highest education level: 12th grade  Occupational History    Employer: RETIRED  Tobacco Use   Smoking status: Never    Passive exposure: Never   Smokeless tobacco: Never   Tobacco comments:    Verified by Daughter, [name]  Vaping Use   Vaping Use: Never used  Substance and Sexual Activity   Alcohol use: No    Alcohol/week: 0.0 standard drinks of alcohol  Drug use: No   Sexual activity: Not Currently  Other Topics Concern   Not on file  Social History Narrative   Original from [place]   Vegetarian   Daily caffeine use one per day   Social Determinants of Health   Financial Resource Strain: Low Risk  (11/16/2021)   Overall Financial Resource Strain (CARDIA)    Difficulty of Paying Living Expenses: Not hard at all  Food Insecurity: No Food Insecurity (11/16/2021)   Hunger Vital Sign    Worried About Running Out of Food in the Last Year: Never true    Ran Out of Food in the Last Year: Never true   Transportation Needs: No Transportation Needs (11/16/2021)   PRAPARE - Hydrologist (Medical): No    Lack of Transportation (Non-Medical): No  Physical Activity: Insufficiently Active (11/16/2021)   Exercise Vital Sign    Days of Exercise per Week: 2 days    Minutes of Exercise per Session: 50 min  Stress: No Stress Concern Present (11/16/2021)   [place]    Feeling of Stress : Not at all  Social Connections: Moderately Isolated (11/16/2021)   Social Connection and Isolation Panel [NHANES]    Frequency of Communication with Friends and Family: More than three times a week    Frequency of Social Gatherings with Friends and Family: More than three times a week    Attends Religious Services: More than 4 times per year    Active Member of [employer] or Organizations: No    Attends Archivist Meetings: Never    Marital Status: Widowed   Past Surgical History:  Procedure Laterality Date   COLON SURGERY     Right / Adenocarcinoma / Chemo   COLONOSCOPY  2005, 2008   Dr. Lucia Gaskins   INGUINAL HERNIA REPAIR     Right   IR ANGIO INTRA EXTRACRAN SEL COM CAROTID INNOMINATE BILAT MOD SED  12/13/2017   IR ANGIO VERTEBRAL SEL VERTEBRAL BILAT MOD SED  12/13/2017   IR PTA INTRACRANIAL  12/18/2017   RADIOLOGY WITH ANESTHESIA N/A 12/18/2017   Procedure: Angioplasty with stenting;  Surgeon: Luanne Bras, MD;  Location: [facility];  Service: Radiology;  Laterality: N/A;   Past Medical History:  Diagnosis Date   Adenocarcinoma ([place]) 1985   colon s/p right hemicolectomy and chemotherapy. F/U with cancer center and Dr. Lucia Gaskins rotinely   Depression    Eczema    Epistaxis    f/u per ENT   Erectile dysfunction    Gastric ulcer 12/11   H-Pylori Tx, EGD 3-12: gastritis   Hay fever    Head injury 11/2017   with fall   Hyperlipemia    Hypertension    Hypothyroidism    Hypothyroid   Insomnia    Osteopenia      per DEXA 07-2008 (Rx fosamax)   Rheumatoid arthritis ([place])    Stroke ([place])     " balance issue"   BP 132/77   Pulse (!) 58   Ht '4\' 11"'$  (1.499 m)   Wt 123 lb (55.8 kg)   SpO2 98%   BMI 24.84 kg/m   Opioid Risk Score:   Fall Risk Score:  `1  Depression screen PHQ 2/9     11/16/2021    9:22 AM 08/10/2021    9:50 AM 06/09/2021   10:54 AM 04/13/2020    1:37 PM  Depression screen PHQ 2/9  Decreased Interest 0 0  0 0  Down, Depressed, Hopeless 0 0 0 0  PHQ - 2 Score 0 0 0 0  Altered sleeping  1    Tired, decreased energy  0    Change in appetite  0    Feeling bad or failure about yourself   0    Trouble concentrating  0    Moving slowly or fidgety/restless  1    Suicidal thoughts  0    PHQ-9 Score  2    Difficult doing work/chores  Not difficult at all        Review of Systems     Objective:   Physical Exam Vitals and nursing note reviewed.  Constitutional:      Appearance: He is normal weight.  HENT:     Head: Normocephalic and atraumatic.  Eyes:     Extraocular Movements: Extraocular movements intact.     Conjunctiva/sclera: Conjunctivae normal.     Pupils: Pupils are equal, round, and reactive to light.  Neurological:     Mental Status: He is alert.     Cranial Nerves: No dysarthria or facial asymmetry.     Motor: No weakness or abnormal muscle tone.     Gait: Gait abnormal.     Comments: Motor strength is 5 -/5 in bilateral deltoid bicep tricep grip hip flexor knee extensor ankle dorsiflexion plantar flexor No pain with upper extremity range of motion No pain with shoulder abduction. Sit to stand with minimal to moderate assistance, pending patient tends to lean backwards  Psychiatric:        Mood and Affect: Mood normal.        Behavior: Behavior normal.           Assessment & Plan:   Small right corona radiata infarct, no significant weakness at this time.  Has severe balance issues tends to lean backwards and according to the caregiver at  times also to the left.  Were about 6 months post CVA do not expect any significant improvements at this point. He will complete his physical therapy continue home exercise program Physical medicine rehab follow-up on as-needed basis Follow-up with neurology as well as with primary care

## 2021-12-16 ENCOUNTER — Ambulatory Visit: Payer: Medicare Other

## 2021-12-16 LAB — COMPREHENSIVE METABOLIC PANEL
ALT: 28 IU/L (ref 0–44)
AST: 24 IU/L (ref 0–40)
Albumin/Globulin Ratio: 1.6 (ref 1.2–2.2)
Albumin: 4.3 g/dL (ref 3.6–4.6)
Alkaline Phosphatase: 86 IU/L (ref 44–121)
BUN/Creatinine Ratio: 15 (ref 10–24)
BUN: 22 mg/dL (ref 10–36)
Bilirubin Total: 0.2 mg/dL (ref 0.0–1.2)
CO2: 26 mmol/L (ref 20–29)
Calcium: 9.7 mg/dL (ref 8.6–10.2)
Chloride: 102 mmol/L (ref 96–106)
Creatinine, Ser: 1.51 mg/dL — ABNORMAL HIGH (ref 0.76–1.27)
Globulin, Total: 2.7 g/dL (ref 1.5–4.5)
Glucose: 113 mg/dL — ABNORMAL HIGH (ref 70–99)
Potassium: 4 mmol/L (ref 3.5–5.2)
Sodium: 143 mmol/L (ref 134–144)
Total Protein: 7 g/dL (ref 6.0–8.5)
eGFR: 43 mL/min/{1.73_m2} — ABNORMAL LOW (ref >59)

## 2021-12-16 LAB — DEMENTIA PANEL
Homocysteine: 12.4 umol/L (ref 0.0–21.3)
RPR Ser Ql: NONREACTIVE
TSH: 1.5 u[IU]/mL (ref 0.450–4.500)
Vitamin B-12: 1163 pg/mL (ref 232–1245)

## 2021-12-16 LAB — CBC WITH DIFFERENTIAL
Basophils Absolute: 0.1 10*3/uL (ref 0.0–0.2)
Basos: 1 %
EOS (ABSOLUTE): 0.2 10*3/uL (ref 0.0–0.4)
Eos: 2 %
Hematocrit: 42 % (ref 37.5–51.0)
Hemoglobin: 13.4 g/dL (ref 13.0–17.7)
Immature Grans (Abs): 0.1 10*3/uL (ref 0.0–0.1)
Immature Granulocytes: 1 %
Lymphocytes Absolute: 2 10*3/uL (ref 0.7–3.1)
Lymphs: 20 %
MCH: 29.1 pg (ref 26.6–33.0)
MCHC: 31.9 g/dL (ref 31.5–35.7)
MCV: 91 fL (ref 79–97)
Monocytes Absolute: 0.8 10*3/uL (ref 0.1–0.9)
Monocytes: 8 %
Neutrophils Absolute: 6.9 10*3/uL (ref 1.4–7.0)
Neutrophils: 68 %
RBC: 4.61 x10E6/uL (ref 4.14–5.80)
RDW: 13.3 % (ref 11.6–15.4)
WBC: 10 10*3/uL (ref 3.4–10.8)

## 2021-12-16 LAB — THYROID PANEL WITH TSH
Free Thyroxine Index: 2.9 (ref 1.2–4.9)
T3 Uptake Ratio: 33 % (ref 24–39)
T4, Total: 8.8 ug/dL (ref 4.5–12.0)

## 2021-12-19 DIAGNOSIS — I10 Essential (primary) hypertension: Secondary | ICD-10-CM

## 2021-12-19 DIAGNOSIS — G459 Transient cerebral ischemic attack, unspecified: Secondary | ICD-10-CM | POA: Diagnosis not present

## 2021-12-19 DIAGNOSIS — E785 Hyperlipidemia, unspecified: Secondary | ICD-10-CM | POA: Diagnosis not present

## 2021-12-19 NOTE — Progress Notes (Signed)
Kindly inform the patient that lab work for reversible causes of memory loss, thyroid panel, metabolic panel and CBC were all normal.  Nothing to worry about

## 2021-12-20 ENCOUNTER — Encounter: Payer: Self-pay | Admitting: *Deleted

## 2021-12-21 ENCOUNTER — Ambulatory Visit: Payer: Medicare Other | Admitting: Physical Therapy

## 2021-12-21 ENCOUNTER — Ambulatory Visit (INDEPENDENT_AMBULATORY_CARE_PROVIDER_SITE_OTHER): Payer: Medicare Other

## 2021-12-21 DIAGNOSIS — I639 Cerebral infarction, unspecified: Secondary | ICD-10-CM | POA: Diagnosis not present

## 2021-12-21 DIAGNOSIS — I63511 Cerebral infarction due to unspecified occlusion or stenosis of right middle cerebral artery: Secondary | ICD-10-CM | POA: Diagnosis not present

## 2021-12-21 DIAGNOSIS — I10 Essential (primary) hypertension: Secondary | ICD-10-CM | POA: Diagnosis not present

## 2021-12-21 DIAGNOSIS — I4891 Unspecified atrial fibrillation: Secondary | ICD-10-CM

## 2021-12-21 NOTE — Progress Notes (Unsigned)
[employer] event monitor serial # [id number] from office inventory applied to patient.  [place] interpreter present for instructions.  Dr. Oval Linsey to read.

## 2021-12-22 ENCOUNTER — Ambulatory Visit: Payer: Medicare Other | Admitting: Physical Therapy

## 2021-12-26 ENCOUNTER — Ambulatory Visit: Payer: Medicare Other | Admitting: Neurology

## 2021-12-26 ENCOUNTER — Ambulatory Visit: Payer: Medicare Other | Admitting: Physical Therapy

## 2021-12-26 DIAGNOSIS — R4182 Altered mental status, unspecified: Secondary | ICD-10-CM | POA: Diagnosis not present

## 2021-12-26 DIAGNOSIS — R413 Other amnesia: Secondary | ICD-10-CM

## 2021-12-27 ENCOUNTER — Ambulatory Visit: Payer: Medicare Other | Attending: Family Medicine | Admitting: Physical Therapy

## 2021-12-27 ENCOUNTER — Encounter: Payer: Self-pay | Admitting: Physical Therapy

## 2021-12-27 DIAGNOSIS — R279 Unspecified lack of coordination: Secondary | ICD-10-CM | POA: Diagnosis not present

## 2021-12-27 DIAGNOSIS — R2689 Other abnormalities of gait and mobility: Secondary | ICD-10-CM | POA: Diagnosis not present

## 2021-12-27 DIAGNOSIS — M6281 Muscle weakness (generalized): Secondary | ICD-10-CM | POA: Diagnosis not present

## 2021-12-27 DIAGNOSIS — I6330 Cerebral infarction due to thrombosis of unspecified cerebral artery: Secondary | ICD-10-CM | POA: Insufficient documentation

## 2021-12-27 NOTE — Therapy (Signed)
OUTPATIENT PHYSICAL THERAPY TREATMENT   Patient Name: [name] MRN: [id number] DOB:[birth year], 88 y.o., male Today's Date: 12/27/2021   PCP: [name] REFERRING PROVIDER: [name]   PT End of Session - 12/27/21 0757     Visit Number 9    Date for PT Re-Evaluation 01/11/22    Authorization Type [employer]    PT Start Time 8338    PT Stop Time 0845    PT Time Calculation (min) 48 min    Activity Tolerance Patient tolerated treatment well    Behavior During Therapy [facility] for tasks assessed/performed                  Past Medical History:  Diagnosis Date   Adenocarcinoma ([place]) 1985   colon s/p right hemicolectomy and chemotherapy. F/U with cancer center and Dr. Lucia Gaskins rotinely   Depression    Eczema    Epistaxis    f/u per ENT   Erectile dysfunction    Gastric ulcer 12/11   H-Pylori Tx, EGD 3-12: gastritis   Hay fever    Head injury 11/2017   with fall   Hyperlipemia    Hypertension    Hypothyroidism    Hypothyroid   Insomnia    Osteopenia     per DEXA 07-2008 (Rx fosamax)   Rheumatoid arthritis ([place])    Stroke ([place])     " balance issue"   Past Surgical History:  Procedure Laterality Date   COLON SURGERY     Right / Adenocarcinoma / Chemo   COLONOSCOPY  2005, 2008   Dr. Lucia Gaskins   INGUINAL HERNIA REPAIR     Right   IR ANGIO INTRA EXTRACRAN SEL COM CAROTID INNOMINATE BILAT MOD SED  12/13/2017   IR ANGIO VERTEBRAL SEL VERTEBRAL BILAT MOD SED  12/13/2017   IR PTA INTRACRANIAL  12/18/2017   RADIOLOGY WITH ANESTHESIA N/A 12/18/2017   Procedure: Angioplasty with stenting;  Surgeon: Luanne Bras, MD;  Location: [facility];  Service: Radiology;  Laterality: N/A;   Patient Active Problem List   Diagnosis Date Noted   Requires assistance with activities of daily living (ADL) 10/11/2021   Bronchitis 10/11/2021   Trapezium fracture 09/09/2021   CMC arthritis, thumb, degenerative 09/09/2021   Left wrist pain 09/05/2021   Radial nerve palsy, left  09/05/2021   Acute shoulder pain due to trauma, left 07/29/2021   Slow transit constipation    Stroke (cerebrum) ([place]) 07/07/2021   CVA (cerebral vascular accident) [facility]) with intracranial vascular stenosis 06/29/2021   Intracranial vascular stenosis 06/29/2021   Prediabetes 06/29/2021   Normocytic anemia 06/29/2021   History of stroke 06/29/2021   Other drug-induced neutropenia ([place]) 06/09/2021   Malignant neoplasm of ascending colon ([place]) 06/09/2021   Stenosis of artery ([place]) 06/09/2021   Preventative health care 03/01/2021   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   Kidney disease, chronic, stage III (moderate, EGFR 30-59 ml/min) (HCC) 08/07/2020   Leukopenia 08/07/2020   Acute respiratory failure with hypoxia ([place]) 08/07/2020   SIRS (systemic inflammatory response syndrome) ([place]) 08/07/2020   Methotrexate adverse reaction, initial encounter    Acute pain of right shoulder 04/14/2020   Lower extremity edema 11/13/2019   Head trauma 11/13/2019   Dyslipidemia 11/13/2019   Memory loss 07/28/2019   Chronic right shoulder pain 07/28/2019   Frequent falls 07/28/2019   Balance problem 07/28/2019   Dysphagia 07/28/2019   Traumatic hemorrhage of cerebrum, unspecified, without loss of consciousness, initial encounter ([place])    Basilar artery stenosis  with infarction ([place]) 12/18/2017   TIA (transient ischemic attack) 12/10/2017   Rheumatoid arthritis ([place])    Chest pain 06/02/2011   General medical examination 04/11/2011   PEPTIC ULCER DISEASE, HELICOBACTER PYLORI POSITIVE 07/12/2010   PERSONAL HISTORY MALIG NEOPLASM LARGE INTESTINE 05/18/2010   MITRAL VALVE DISORDERS 04/05/2009   HOARSENESS 12/04/2008   OSTEOPENIA 08/26/2008   HYPERTRIGLYCERIDEMIA 07/23/2008   EPISTAXIS 07/26/2007   ERECTILE DYSFUNCTION 02/22/2007   ECZEMA 12/07/2006   LEG CRAMPS 10/09/2006   Hypothyroidism 08/28/2006   Essential hypertension 08/28/2006   HAY FEVER 08/28/2006   INSOMNIA 08/28/2006    ONSET DATE:  07/07/2021  REFERRING DIAG: A16.55  THERAPY DIAG:  Cerebrovascular accident (CVA) due to thrombosis of cerebral artery (HCC)  Other abnormalities of gait and mobility  Muscle weakness (generalized)  Unspecified lack of coordination  Rationale for Evaluation and Treatment Rehabilitation  SUBJECTIVE:                                                                                                                                                                                              SUBJECTIVE STATEMENT:  Patient has not been in to see [place] in over 5-6 weeks his spouse passed away, caregiver reports one fall over the past few weeks.  Reports that he is not doing as much and seems weaker.  Pt accompanied by: interpreter  PERTINENT HISTORY: colon surgery, head inury 11/2017, requires assistance w/ADLs, bronchitis 10/11/21, L radial nerve palsy 08/2021, hx of stroke and CVA, stage 3 kidney disease, memory loss, frequent falls (the most recent being 08/27/21 and he hurt his shoulder)  PAIN:  Are you having pain? No  PRECAUTIONS: Fall  WEIGHT BEARING RESTRICTIONS No  FALLS: Has patient fallen in last 6 months? Yes. 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, historic buildings and History of cognitive impairments - at baseline Hx of intermittent confusion, unable to answer questions with full understanding   LOWER EXTREMITY ROM:   no objective measurements taken, but noticeable hip tightness with gait, and IR/ER tightness  when tested in wheelchair  Active  Right Eval Left Eval  Hip flexion    Hip extension    Hip abduction  Hip adduction    Hip internal rotation    Hip external rotation    Knee flexion    Knee extension    Ankle dorsiflexion    Ankle plantarflexion    Ankle inversion    Ankle eversion     (Blank rows = not tested)  LOWER EXTREMITY MMT:    MMT Right Eval Left Eval  Hip flexion 4- 4-  Hip extension    Hip abduction 3+ 3+  Hip adduction 4 4  Hip internal rotation    Hip external rotation    Knee flexion 4- 4-  Knee extension 4- 4-  Ankle dorsiflexion 4 4  Ankle plantarflexion 4 4  Ankle inversion    Ankle eversion    (Blank rows = not tested)  UPPER EXTREMITY ROM:     Active  Right Eval Left Eval  Shoulder flexion 125 110  Shoulder extension    Shoulder abduction 73 90  Shoulder adduction    Shoulder IR    Shoulder ER [facility] WFL  Elbow flexion WNL WNL  Elbow extension WNL WNL   (Blank rows = not tested)  UPPER EXTREMITY MMT:     MMT Right Eval Left Eval  Shoulder flexion 3 3  Shoulder extension    Shoulder abduction 2+ 2+  Shoulder adduction    Shoulder IR 3+ 3+  Shoulder ER 3+ 3+  Elbow flexion 4- 4-  Elbow extension 4- 4-  (Blank rows = not tested)  BED MOBILITY:  TBD  TRANSFERS: Assistive device utilized: Environmental consultant - 2 wheeled and Wheelchair (manual)  Sit to stand: Mod A Stand to sit: Mod A Patient has a tendency to push backwards with transfers  GAIT: Gait pattern: step to pattern, decreased step length- Right, decreased step length- Left, decreased stride length, shuffling, ataxic, trendelenburg, decreased trunk rotation, trunk flexed, and wide BOS Distance walked: 46f Assistive device utilized: Walker - 2 wheeled Level of assistance: Mod A Comments: modA to stand from w/c and walk 137f unsteadiness on feet especially with turns, slow gait, wide BOS  FUNCTIONAL TESTs:  5 times sit to stand: TBD (unable to do w/o modA) Timed up and  go (TUG): 31m22m30sec w/modA-maxA   TODAY'S TREATMENT:  12/27/21   Walking HHA 4x100'   Side stepping HHA    NuStep Level 4 x 5 minutes   Marches 2.5#   Hip abduction 2.5#   LAQ 2.5#   Red tband HS curls   Sit to stand HHA x6  11/17/21 Ambulation with RW in assessment- patietn tends to flex forward, uses excessive hip flexion to clear feet compensating due to previously dragging feet. 80', CGA, stable. Practiced forward weiht hsifts progressing ot sit to stand from mat with hands clasped out in front to prevent posterior lean, S Standing step back into outward rotation, then weight shift back to place object on mat, 6 reps to each side, CGA. Ambulation while holding 1# ball in BUE to encourage upright posture, 1 x 70', VC to take longer steps and maintain upright posture. 4 square stepping to encourage improved balance and ability to change/wt shift in all directions. CGA 11/15/21 Nustep L3 [id number] HS curls 2x12 Standing heel taps on 6" 20reps Forward and side stepping over obstacles in //  Mini squats 2x10 Walking with quad cane   Standing ball toss 20 reps  OHP with red ball 2x10   11/07/21 Bike L2, 5 mins  LAQ 3# 2x10   TB hamstring curls seated 2x10  Banded ankle DF 2x10  Ladder drill for big steps forwards and sideways [facility]   11/03/21 Nustep L2, 60mns STS pushing from arm rest 2x10 Walking with cane 1 lap inside HHA side steps  Seated rows w/green band 2x12   LAQ 3# 2x10   11/01/21 Standing marches 2#  Standing abduction 2# Side stepping in parallel bars Walking with one HHA in parallel bars Walking w/quad cane 536fw/minA Standing shoulder flexion with red ball 2x10  10/27/21: Nustep Level 3 x 5 minutes Standing 2# marching and hip abduction 2x10 int he Pbars Standing ball toss with SBA Side stepping and backward walking HHA walking 100' x2 with w/c follow, working on speed and step length     PATIENT EDUCATION: Education details: POC  Person  educated: Patient and CaArmed forces training and education officerethod: ExConsulting civil engineerDeMedia plannerTaCorporate treasurerues, and Verbal cues Education comprehension: returned demonstration, verbal cues required, tactile cues required, and needs further education   HOME EXERCISE PROGRAM: Access Code: [email or web address]    GOALS: Goals reviewed with patient? Yes  SHORT TERM GOALS: Target date: 11/30/21  Patient will complete HEP Baseline: Goal status: partially met    LONG TERM GOALS: Target date: 01/11/22  Patient will demonstrate 4/5 strength in UE to be able to complete ADLs with minA Baseline:  Goal status: on going  2.  Patient will ambulate 505fith minA or better to be able to navigate inside home Baseline:  Goal status: on going  3.  Patient will demonstrate >= 4/5 strength in LE to be able to complete transfers with less assistance and become household ambulator Baseline:  Goal status: ongoing  4.  Patient will score <1mi33mn TUG using rolling walker and minA Baseline:  Goal status: INITIAL   ASSESSMENT:  CLINICAL IMPRESSION:  Patient has not been in to PT in about 6 weeks, his spouse passed away, caregiver reports a fall onto carpet, got feet tangled up.  They report that he has not done much as he wanted to be with his spouse.  He does seem to be weaker with walking and standing, he does tend to push backward with standing up, needing cues to lean forward.  OBJECTIVE IMPAIRMENTS Abnormal gait, decreased activity tolerance, decreased balance, decreased cognition, decreased coordination, decreased knowledge of condition, decreased mobility, difficulty walking, decreased ROM, decreased strength, and decreased safety awareness.   ACTIVITY LIMITATIONS sitting, standing, transfers, bed mobility, bathing, toileting, dressing, and locomotion level  PARTICIPATION LIMITATIONS: cleaning, interpersonal relationship, community activity, and home maintenance  PERSONAL FACTORS Age, Behavior pattern, and 3+  comorbidities: frequent falls, hx of strokes, memory loss  are also affecting patient's functional outcome.   REHAB POTENTIAL: Fair age, low functional level, and cognitive factors may cause difficulty with treatment  CLINICAL DECISION MAKING: Evolving/moderate complexity  EVALUATION COMPLEXITY: Moderate  PLAN: PT FREQUENCY: 2x/week  PT DURATION: 12 weeks  PLANNED INTERVENTIONS: Therapeutic exercises, Therapeutic activity, Neuromuscular re-education, Balance training, Gait training, Patient/Family education, Joint mobilization, Stair training, Wheelchair mobility training, and Re-evaluation  PLAN FOR NEXT SESSION: will resume strength, gait and balance activities and ask that the family do this at home as well  MichLum Babe 12/27/2021, 7:58 AM

## 2021-12-27 NOTE — Progress Notes (Signed)
Kindly call the patient and she has not yet reviewed the results in MyChart

## 2021-12-28 ENCOUNTER — Ambulatory Visit: Payer: Medicare Other | Admitting: Physical Therapy

## 2021-12-28 NOTE — Progress Notes (Signed)
Kindly inform patient that EEG shows mild slowing of brain wave activity which can occur with old age. No seizure activity noted.

## 2021-12-29 ENCOUNTER — Ambulatory Visit (HOSPITAL_COMMUNITY)
Admission: RE | Admit: 2021-12-29 | Discharge: 2021-12-29 | Disposition: A | Discharge status: Home / Self Care | Payer: Medicare Other | Source: Ambulatory Visit | Attending: Physician Assistant | Admitting: Physician Assistant

## 2021-12-29 ENCOUNTER — Ambulatory Visit (HOSPITAL_COMMUNITY): Payer: Medicare Other

## 2021-12-29 ENCOUNTER — Telehealth: Payer: Self-pay | Admitting: *Deleted

## 2021-12-29 ENCOUNTER — Ambulatory Visit (HOSPITAL_COMMUNITY): Admission: RE | Admit: 2021-12-29 | Payer: Medicare Other | Source: Ambulatory Visit

## 2021-12-29 DIAGNOSIS — R1314 Dysphagia, pharyngoesophageal phase: Secondary | ICD-10-CM

## 2021-12-29 DIAGNOSIS — K219 Gastro-esophageal reflux disease without esophagitis: Secondary | ICD-10-CM

## 2021-12-29 NOTE — Telephone Encounter (Addendum)
I spoke to the patient's daughter, [name] (on [place] - speaks [place]). Provided her with the EEG results.   She would like to clarify if her father should be on both aspirin '81mg'$  and Plavix '75mg'$  or just one agent.

## 2021-12-29 NOTE — Telephone Encounter (Signed)
-----   Message from Garvin Fila, MD sent at 12/28/2021  9:28 PM EDT ----- Mitchell Heir inform patient that EEG shows mild slowing of brain wave activity which can occur with old age. No seizure activity noted.

## 2022-01-02 ENCOUNTER — Ambulatory Visit: Payer: Medicare Other | Admitting: Physical Therapy

## 2022-01-02 NOTE — Telephone Encounter (Signed)
Dr. Leonie Man sent the following message:  Aspirin 81 mg daily alone.  No need for Plavix now.    _____________________________________ I called the patient's dgt and provided this update. She verbalized understanding.

## 2022-01-04 ENCOUNTER — Other Ambulatory Visit: Payer: Self-pay | Admitting: Family Medicine

## 2022-01-04 ENCOUNTER — Ambulatory Visit: Payer: Medicare Other | Admitting: Physical Therapy

## 2022-01-04 DIAGNOSIS — G4700 Insomnia, unspecified: Secondary | ICD-10-CM

## 2022-01-10 ENCOUNTER — Ambulatory Visit (HOSPITAL_COMMUNITY)
Admission: RE | Admit: 2022-01-10 | Discharge: 2022-01-10 | Disposition: A | Discharge status: Home / Self Care | Payer: Medicare Other | Source: Ambulatory Visit | Attending: Physician Assistant | Admitting: Physician Assistant

## 2022-01-10 ENCOUNTER — Ambulatory Visit: Payer: Medicare Other | Admitting: Physical Therapy

## 2022-01-10 DIAGNOSIS — R059 Cough, unspecified: Secondary | ICD-10-CM | POA: Insufficient documentation

## 2022-01-10 DIAGNOSIS — R1310 Dysphagia, unspecified: Secondary | ICD-10-CM | POA: Insufficient documentation

## 2022-01-12 ENCOUNTER — Ambulatory Visit: Payer: Medicare Other

## 2022-01-16 ENCOUNTER — Encounter: Payer: Self-pay | Admitting: Physical Therapy

## 2022-01-16 ENCOUNTER — Encounter: Payer: Medicare Other | Admitting: Physical Therapy

## 2022-01-16 ENCOUNTER — Telehealth: Payer: Medicare Other

## 2022-01-16 ENCOUNTER — Ambulatory Visit: Payer: Medicare Other | Admitting: Physical Therapy

## 2022-01-16 DIAGNOSIS — R2689 Other abnormalities of gait and mobility: Secondary | ICD-10-CM

## 2022-01-16 DIAGNOSIS — I6330 Cerebral infarction due to thrombosis of unspecified cerebral artery: Secondary | ICD-10-CM

## 2022-01-16 DIAGNOSIS — R279 Unspecified lack of coordination: Secondary | ICD-10-CM

## 2022-01-16 DIAGNOSIS — M6281 Muscle weakness (generalized): Secondary | ICD-10-CM

## 2022-01-16 NOTE — Therapy (Signed)
OUTPATIENT PHYSICAL THERAPY TREATMENT Progress Note Reporting Period 10/19/21 to 01/16/22  See note below for Objective Data and Assessment of Progress/Goals.      Patient Name: [name] MRN: [id number] DOB:[birth year], 88 y.o., male Today's Date: 01/16/2022   PCP: [name] REFERRING PROVIDER: [name]   PT End of Session - 01/16/22 1101     Visit Number 10    Date for PT Re-Evaluation 02/16/22    Authorization Type [employer]    PT Start Time 1055    PT Stop Time 1140    PT Time Calculation (min) 45 min    Equipment Utilized During Treatment Gait belt    Activity Tolerance Patient tolerated treatment well    Behavior During Therapy WFL for tasks assessed/performed                  Past Medical History:  Diagnosis Date   Adenocarcinoma ([address]) 1985   colon s/p right hemicolectomy and chemotherapy. F/U with cancer center and Dr. Lucia Gaskins rotinely   Depression    Eczema    Epistaxis    f/u per ENT   Erectile dysfunction    Gastric ulcer 12/11   H-Pylori Tx, EGD 3-12: gastritis   Hay fever    Head injury 11/2017   with fall   Hyperlipemia    Hypertension    Hypothyroidism    Hypothyroid   Insomnia    Osteopenia     per DEXA 07-2008 (Rx fosamax)   Rheumatoid arthritis ([place])    Stroke ([place])     " balance issue"   Past Surgical History:  Procedure Laterality Date   COLON SURGERY     Right / Adenocarcinoma / Chemo   COLONOSCOPY  2005, 2008   Dr. Lucia Gaskins   INGUINAL HERNIA REPAIR     Right   IR ANGIO INTRA EXTRACRAN SEL COM CAROTID INNOMINATE BILAT MOD SED  12/13/2017   IR ANGIO VERTEBRAL SEL VERTEBRAL BILAT MOD SED  12/13/2017   IR PTA INTRACRANIAL  12/18/2017   RADIOLOGY WITH ANESTHESIA N/A 12/18/2017   Procedure: Angioplasty with stenting;  Surgeon: Luanne Bras, MD;  Location: [place];  Service: Radiology;  Laterality: N/A;   Patient Active Problem List   Diagnosis Date Noted   Requires assistance with activities of daily living  (ADL) 10/11/2021   Bronchitis 10/11/2021   Trapezium fracture 09/09/2021   CMC arthritis, thumb, degenerative 09/09/2021   Left wrist pain 09/05/2021   Radial nerve palsy, left 09/05/2021   Acute shoulder pain due to trauma, left 07/29/2021   Slow transit constipation    Stroke (cerebrum) ([place]) 07/07/2021   CVA (cerebral vascular accident) [facility]) with intracranial vascular stenosis 06/29/2021   Intracranial vascular stenosis 06/29/2021   Prediabetes 06/29/2021   Normocytic anemia 06/29/2021   History of stroke 06/29/2021   Other drug-induced neutropenia ([place]) 06/09/2021   Malignant neoplasm of ascending colon ([place]) 06/09/2021   Stenosis of artery ([place]) 06/09/2021   Preventative health care 03/01/2021   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   Kidney disease, chronic, stage III (moderate, EGFR 30-59 ml/min) ([place]) 08/07/2020   Leukopenia 08/07/2020   Acute respiratory failure with hypoxia ([place]) 08/07/2020   SIRS (systemic inflammatory response syndrome) ([place]) 08/07/2020   Methotrexate adverse reaction, initial encounter    Acute pain of right shoulder 04/14/2020   Lower extremity edema 11/13/2019   Head trauma 11/13/2019   Dyslipidemia 11/13/2019   Memory loss 07/28/2019   Chronic right shoulder pain 07/28/2019   Frequent falls  07/28/2019   Balance problem 07/28/2019   Dysphagia 07/28/2019   Traumatic hemorrhage of cerebrum, unspecified, without loss of consciousness, initial encounter ([place])    Basilar artery stenosis with infarction ([place]) 12/18/2017   TIA (transient ischemic attack) 12/10/2017   Rheumatoid arthritis ([place])    Chest pain 06/02/2011   General medical examination 04/11/2011   PEPTIC ULCER DISEASE, HELICOBACTER PYLORI POSITIVE 07/12/2010   PERSONAL HISTORY MALIG NEOPLASM LARGE INTESTINE 05/18/2010   MITRAL VALVE DISORDERS 04/05/2009   HOARSENESS 12/04/2008   OSTEOPENIA 08/26/2008   HYPERTRIGLYCERIDEMIA 07/23/2008   EPISTAXIS 07/26/2007   ERECTILE DYSFUNCTION  02/22/2007   ECZEMA 12/07/2006   LEG CRAMPS 10/09/2006   Hypothyroidism 08/28/2006   Essential hypertension 08/28/2006   HAY FEVER 08/28/2006   INSOMNIA 08/28/2006    ONSET DATE: 07/07/2021  REFERRING DIAG: P50.93  THERAPY DIAG:  Cerebrovascular accident (CVA) due to thrombosis of cerebral artery ([place])  Other abnormalities of gait and mobility  Muscle weakness (generalized)  Unspecified lack of coordination  Rationale for Evaluation and Treatment Rehabilitation  SUBJECTIVE:                                                                                                                                                                                              SUBJECTIVE STATEMENT:  Patient again has had issues per caregiver and interpreter of him feeling well, and having some memory issues and times that he has not been able to get up and come due to other appointments a fatigue, no recent falls  Pt accompanied by: interpreter  PERTINENT HISTORY: colon surgery, head inury 11/2017, requires assistance w/ADLs, bronchitis 10/11/21, L radial nerve palsy 08/2021, hx of stroke and CVA, stage 3 kidney disease, memory loss, frequent falls (the most recent being 08/27/21 and he hurt his shoulder)  PAIN:  Are you having pain? No  PRECAUTIONS: Fall  WEIGHT BEARING RESTRICTIONS No  FALLS: Has patient fallen in last 6 months? Yes. 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, historic buildings and History of cognitive  impairments - at baseline Hx of intermittent confusion, unable to answer questions with full understanding   LOWER EXTREMITY ROM:   no objective measurements taken, but noticeable  hip tightness with gait, and IR/ER tightness when tested in wheelchair  Active  Right Eval Left Eval  Hip flexion    Hip extension    Hip abduction    Hip adduction    Hip internal rotation    Hip external rotation    Knee flexion    Knee extension    Ankle dorsiflexion    Ankle plantarflexion    Ankle inversion    Ankle eversion     (Blank rows = not tested)  LOWER EXTREMITY MMT:    MMT Right Eval Left Eval  Hip flexion 4- 4-  Hip extension    Hip abduction 3+ 3+  Hip adduction 4 4  Hip internal rotation    Hip external rotation    Knee flexion 4- 4-  Knee extension 4- 4-  Ankle dorsiflexion 4 4  Ankle plantarflexion 4 4  Ankle inversion    Ankle eversion    (Blank rows = not tested)  UPPER EXTREMITY ROM:     Active  Right Eval Left Eval  Shoulder flexion 125 110  Shoulder extension    Shoulder abduction 73 90  Shoulder adduction    Shoulder IR    Shoulder ER [facility] WFL  Elbow flexion WNL WNL  Elbow extension WNL WNL   (Blank rows = not tested)  UPPER EXTREMITY MMT:     MMT Right Eval Left Eval  Shoulder flexion 3 3  Shoulder extension    Shoulder abduction 2+ 2+  Shoulder adduction    Shoulder IR 3+ 3+  Shoulder ER 3+ 3+  Elbow flexion 4- 4-  Elbow extension 4- 4-  (Blank rows = not tested)  BED MOBILITY:  TBD  TRANSFERS: Assistive device utilized: Environmental consultant - 2 wheeled and Wheelchair (manual)  Sit to stand: Mod A Stand to sit: Mod A Patient has a tendency to push backwards with transfers  GAIT: Gait pattern: step to pattern, decreased step length- Right, decreased step length- Left, decreased stride length, shuffling, ataxic, trendelenburg, decreased trunk rotation, trunk flexed, and wide BOS Distance walked: 92f Assistive device utilized: Walker - 2  wheeled Level of assistance: Mod A Comments: modA to stand from w/c and walk 160f unsteadiness on feet especially with turns, slow gait, wide BOS  FUNCTIONAL TESTs:  5 times sit to stand: TBD (unable to do w/o modA) Timed up and go (TUG): 73m72m30sec w/modA-maxA   TODAY'S TREATMENT:  01/16/22 Nustep level 4 x 6 minutes Leg extension 5# 3x10 Leg curls 15# 3x10 Seated red tban rows and extensions 3x10 [place] HHA x 120 feet, with FWW 120' x 2, then with the FWW x 180 feet to the car In FWWBeecherone toe touches   12/27/21   Walking HHA 4x100'   Side stepping HHA    NuStep Level 4 x 5 minutes   Marches 2.5#   Hip abduction 2.5#   LAQ 2.5#   Red tband HS curls   Sit to stand HHA x6  11/17/21 Ambulation with RW in assessment- patietn tends to flex forward, uses excessive hip flexion to clear feet compensating due to previously dragging feet. 80', CGA, stable. Practiced forward weiht hsifts progressing ot sit to stand from mat with hands clasped out in front to prevent posterior lean, S Standing step back into outward rotation, then weight shift back to place object on mat, 6 reps to each side, CGA. Ambulation while holding 1# ball in BUE to encourage upright posture, 1 x 70', VC to take longer  steps and maintain upright posture. 4 square stepping to encourage improved balance and ability to change/wt shift in all directions. CGA 11/15/21 Nustep L3 x781mns TB HS curls 2x12 Standing heel taps on 6" 20reps Forward and side stepping over obstacles in //  Mini squats 2x10 Walking with quad cane   Standing ball toss 20 reps  OHP with red ball 2x10   11/07/21 Bike L2, 5 mins  LAQ 3# 2x10   TB hamstring curls seated 2x10   Banded ankle DF 2x10  Ladder drill for big steps forwards and sideways 2HHA    PATIENT EDUCATION: Education details: POC  Person educated: Patient and CArmed forces training and education officermethod: Explanation, Demonstration, TCorporate treasurercues, and Verbal cues Education comprehension:  returned demonstration, verbal cues required, tactile cues required, and needs further education   HOME EXERCISE PROGRAM: Access Code: [email or web address]    GOALS: Goals reviewed with patient? Yes  SHORT TERM GOALS: Target date: 11/30/21  Patient will complete HEP Baseline: Goal status: partially met    LONG TERM GOALS: Target date: 01/11/22  Patient will demonstrate 4/5 strength in UE to be able to complete ADLs with minA Baseline:  Goal status: on going  2.  Patient will ambulate 536fwith minA or better to be able to navigate inside home Baseline:  Goal status: on going  3.  Patient will demonstrate >= 4/5 strength in LE to be able to complete transfers with less assistance and become household ambulator Baseline:  Goal status: ongoing  4.  Patient will score <81m581mon TUG using rolling walker and minA Baseline:  Goal status:on going   ASSESSMENT:  CLINICAL IMPRESSION:  Patient has had issues over the past 3 months, we have seen in 10 visits in the 3 months, due to his spouse going down hill and eventually passing, they also report some other issues with health and fatigue.  He did well today with walking with CGA and light push of the walker by PT to speed up gait and increase step length.  If we could get more consistent attendance I feel that he could move better, however he is having some memory issues.  OBJECTIVE IMPAIRMENTS Abnormal gait, decreased activity tolerance, decreased balance, decreased cognition, decreased coordination, decreased knowledge of condition, decreased mobility, difficulty walking, decreased ROM, decreased strength, and decreased safety awareness.   ACTIVITY LIMITATIONS sitting, standing, transfers, bed mobility, bathing, toileting, dressing, and locomotion level  PARTICIPATION LIMITATIONS: cleaning, interpersonal relationship, community activity, and home maintenance  PERSONAL FACTORS Age, Behavior pattern, and 3+ comorbidities: frequent falls, hx  of strokes, memory loss  are also affecting patient's functional outcome.   REHAB POTENTIAL: Fair age, low functional level, and cognitive factors may cause difficulty with treatment  CLINICAL DECISION MAKING: Evolving/moderate complexity  EVALUATION COMPLEXITY: Moderate  PLAN: PT FREQUENCY: 2x/week  PT DURATION: 12 weeks  PLANNED INTERVENTIONS: Therapeutic exercises, Therapeutic activity, Neuromuscular re-education, Balance training, Gait training, Patient/Family education, Joint mobilization, Stair training, Wheelchair mobility training, and Re-evaluation  PLAN FOR NEXT SESSION: will resume strength, gait and balance activities and ask that the family do this at home as well  MicLum BabeT 01/16/2022, 11:02 AM

## 2022-01-18 ENCOUNTER — Ambulatory Visit: Payer: Medicare Other | Admitting: Physical Therapy

## 2022-01-25 ENCOUNTER — Encounter: Payer: Self-pay | Admitting: Physical Therapy

## 2022-01-25 ENCOUNTER — Ambulatory Visit: Payer: Medicare Other | Attending: Family Medicine | Admitting: Physical Therapy

## 2022-01-25 ENCOUNTER — Ambulatory Visit (INDEPENDENT_AMBULATORY_CARE_PROVIDER_SITE_OTHER): Payer: Medicare Other | Admitting: Pharmacist

## 2022-01-25 DIAGNOSIS — E785 Hyperlipidemia, unspecified: Secondary | ICD-10-CM

## 2022-01-25 DIAGNOSIS — R2689 Other abnormalities of gait and mobility: Secondary | ICD-10-CM | POA: Insufficient documentation

## 2022-01-25 DIAGNOSIS — I6330 Cerebral infarction due to thrombosis of unspecified cerebral artery: Secondary | ICD-10-CM | POA: Diagnosis not present

## 2022-01-25 DIAGNOSIS — R279 Unspecified lack of coordination: Secondary | ICD-10-CM | POA: Diagnosis not present

## 2022-01-25 DIAGNOSIS — R413 Other amnesia: Secondary | ICD-10-CM

## 2022-01-25 DIAGNOSIS — Z8673 Personal history of transient ischemic attack (TIA), and cerebral infarction without residual deficits: Secondary | ICD-10-CM

## 2022-01-25 DIAGNOSIS — G4700 Insomnia, unspecified: Secondary | ICD-10-CM

## 2022-01-25 DIAGNOSIS — I10 Essential (primary) hypertension: Secondary | ICD-10-CM

## 2022-01-25 DIAGNOSIS — M6281 Muscle weakness (generalized): Secondary | ICD-10-CM | POA: Insufficient documentation

## 2022-01-25 NOTE — Therapy (Signed)
OUTPATIENT PHYSICAL THERAPY TREATMENT Progress Note Reporting Period 10/19/21 to 01/16/22  See note below for Objective Data and Assessment of Progress/Goals.      Patient Name: [name] MRN: [id number] DOB:09/29/1928, 88 y.o., male Today's Date: 01/25/2022   PCP: Yvonne Lowne Chase REFERRING PROVIDER: Yvonne Lowne Chase   PT End of Session - 01/25/22 1545     Visit Number 11    Date for PT Re-Evaluation 02/16/22    Authorization Type [facility] [facility]    PT Start Time 1412    PT Stop Time 1456    PT Time Calculation (min) 44 min    Equipment Utilized During Treatment Gait belt    Activity Tolerance Patient tolerated treatment well    Behavior During Therapy WFL for tasks assessed/performed                   Past Medical History:  Diagnosis Date   Adenocarcinoma (HCC) 1985   colon s/p right hemicolectomy and chemotherapy. F/U with cancer center and Dr. Newman rotinely   Depression    Eczema    Epistaxis    f/u per ENT   Erectile dysfunction    Gastric ulcer 12/11   H-Pylori Tx, EGD 3-12: gastritis   Hay fever    Head injury 11/2017   with fall   Hyperlipemia    Hypertension    Hypothyroidism    Hypothyroid   Insomnia    Osteopenia     per DEXA 07-2008 (Rx fosamax)   Rheumatoid arthritis (HCC)    Stroke (HCC)     " balance issue"   Past Surgical History:  Procedure Laterality Date   COLON SURGERY     Right / Adenocarcinoma / Chemo   COLONOSCOPY  2005, 2008   Dr. Newman   INGUINAL HERNIA REPAIR     Right   IR ANGIO INTRA EXTRACRAN SEL COM CAROTID INNOMINATE BILAT MOD SED  12/13/2017   IR ANGIO VERTEBRAL SEL VERTEBRAL BILAT MOD SED  12/13/2017   IR PTA INTRACRANIAL  12/18/2017   RADIOLOGY WITH ANESTHESIA N/A 12/18/2017   Procedure: Angioplasty with stenting;  Surgeon: Deveshwar, Sanjeev, MD;  Location: [facility];  Service: Radiology;  Laterality: N/A;   Patient Active Problem List   Diagnosis Date Noted   Requires assistance with activities of daily living  (ADL) 10/11/2021   Bronchitis 10/11/2021   Trapezium fracture 09/09/2021   CMC arthritis, thumb, degenerative 09/09/2021   Left wrist pain 09/05/2021   Radial nerve palsy, left 09/05/2021   Acute shoulder pain due to trauma, left 07/29/2021   Slow transit constipation    Stroke (cerebrum) (HCC) 07/07/2021   CVA (cerebral vascular accident) (HCC) with intracranial vascular stenosis 06/29/2021   Intracranial vascular stenosis 06/29/2021   Prediabetes 06/29/2021   Normocytic anemia 06/29/2021   History of stroke 06/29/2021   Other drug-induced neutropenia (HCC) 06/09/2021   Malignant neoplasm of ascending colon (HCC) 06/09/2021   Stenosis of artery (HCC) 06/09/2021   Preventative health care 03/01/2021   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   Kidney disease, chronic, stage III (moderate, EGFR 30-59 ml/min) (HCC) 08/07/2020   Leukopenia 08/07/2020   Acute respiratory failure with hypoxia (HCC) 08/07/2020   SIRS (systemic inflammatory response syndrome) (HCC) 08/07/2020   Methotrexate adverse reaction, initial encounter    Acute pain of right shoulder 04/14/2020   Lower extremity edema 11/13/2019   Head trauma 11/13/2019   Dyslipidemia 11/13/2019   Memory loss 07/28/2019   Chronic right shoulder pain 07/28/2019   Frequent   falls 07/28/2019   Balance problem 07/28/2019   Dysphagia 07/28/2019   Traumatic hemorrhage of cerebrum, unspecified, without loss of consciousness, initial encounter (HCC)    Basilar artery stenosis with infarction (HCC) 12/18/2017   TIA (transient ischemic attack) 12/10/2017   Rheumatoid arthritis (HCC)    Chest pain 06/02/2011   General medical examination 04/11/2011   PEPTIC ULCER DISEASE, HELICOBACTER PYLORI POSITIVE 07/12/2010   PERSONAL HISTORY MALIG NEOPLASM LARGE INTESTINE 05/18/2010   MITRAL VALVE DISORDERS 04/05/2009   HOARSENESS 12/04/2008   OSTEOPENIA 08/26/2008   HYPERTRIGLYCERIDEMIA 07/23/2008   EPISTAXIS 07/26/2007   ERECTILE DYSFUNCTION  02/22/2007   ECZEMA 12/07/2006   LEG CRAMPS 10/09/2006   Hypothyroidism 08/28/2006   Essential hypertension 08/28/2006   HAY FEVER 08/28/2006   INSOMNIA 08/28/2006    ONSET DATE: 07/07/2021  REFERRING DIAG: Z86.73  THERAPY DIAG:  Cerebrovascular accident (CVA) due to thrombosis of cerebral artery (HCC)  Other abnormalities of gait and mobility  Muscle weakness (generalized)  Unspecified lack of coordination  Rationale for Evaluation and Treatment Rehabilitation  SUBJECTIVE:                                                                                                                                                                                              SUBJECTIVE STATEMENT:  Patient reports no falls or changes. His family reports his balance has diminished and he is fearful of falling.  Pt accompanied by: interpreter  PERTINENT HISTORY: colon surgery, head inury 11/2017, requires assistance w/ADLs, bronchitis 10/11/21, L radial nerve palsy 08/2021, hx of stroke and CVA, stage 3 kidney disease, memory loss, frequent falls (the most recent being 08/27/21 and he hurt his shoulder)  PAIN:  Are you having pain? No  PRECAUTIONS: Fall  WEIGHT BEARING RESTRICTIONS No  FALLS: Has patient fallen in last 6 months? Yes. Number of falls multiple and patient denied that he had falls but chart review says otherwise  LIVING ENVIRONMENT: Lives with: lives with their family Lives in: House/apartment Stairs: Yes: Internal: 12 steps; bilateral but cannot reach both Has following equipment at home: Walker - 2 wheeled  PLOF: Needs assistance with ADLs and Needs assistance with gait  PATIENT GOALS walk with walker no assistance  OBJECTIVE:   COGNITION: Overall cognitive status: Impaired: Memory: Impaired: Working Short term Long term Procedural and History of cognitive impairments - at baseline Hx of intermittent confusion, unable to answer questions with full  understanding   LOWER EXTREMITY ROM:   no objective measurements taken, but noticeable hip tightness with gait, and IR/ER tightness when tested in wheelchair  Active  Right Eval Left Eval  Hip   flexion    Hip extension    Hip abduction    Hip adduction    Hip internal rotation    Hip external rotation    Knee flexion    Knee extension    Ankle dorsiflexion    Ankle plantarflexion    Ankle inversion    Ankle eversion     (Blank rows = not tested)  LOWER EXTREMITY MMT:    MMT Right Eval Left Eval  Hip flexion 4- 4-  Hip extension    Hip abduction 3+ 3+  Hip adduction 4 4  Hip internal rotation    Hip external rotation    Knee flexion 4- 4-  Knee extension 4- 4-  Ankle dorsiflexion 4 4  Ankle plantarflexion 4 4  Ankle inversion    Ankle eversion    (Blank rows = not tested)  UPPER EXTREMITY ROM:     Active  Right Eval Left Eval  Shoulder flexion 125 110  Shoulder extension    Shoulder abduction 73 90  Shoulder adduction    Shoulder IR    Shoulder ER [facility] WFL  Elbow flexion WNL WNL  Elbow extension WNL WNL   (Blank rows = not tested)  UPPER EXTREMITY MMT:     MMT Right Eval Left Eval  Shoulder flexion 3 3  Shoulder extension    Shoulder abduction 2+ 2+  Shoulder adduction    Shoulder IR 3+ 3+  Shoulder ER 3+ 3+  Elbow flexion 4- 4-  Elbow extension 4- 4-  (Blank rows = not tested)  BED MOBILITY:  TBD  TRANSFERS: Assistive device utilized: Environmental consultant - 2 wheeled and Wheelchair (manual)  Sit to stand: Mod A Stand to sit: Mod A Patient has a tendency to push backwards with transfers  GAIT: Gait pattern: step to pattern, decreased step length- Right, decreased step length- Left, decreased stride length, shuffling, ataxic, trendelenburg, decreased trunk rotation, trunk flexed, and wide BOS Distance walked: 50f Assistive device utilized: Walker - 2 wheeled Level of assistance: Mod A Comments: modA to stand from w/c and walk 149f unsteadiness on  feet especially with turns, slow gait, wide BOS  FUNCTIONAL TESTs:  5 times sit to stand: TBD (unable to do w/o modA) Timed up and go (TUG): 49m10m30sec w/modA-maxA   TODAY'S TREATMENT:  01/25/22 NuStep U and LE, L3 x 5 minutes Seated trunk stability, had difficulty understanding opposite arm and leg raise Seated reach up and reach into diagonals with 1# ball in BUE Seated forward weight shifts with hands on RW, push it forward x 3. Last one, moved sit to stand Sit to stand x 10 reps. Standing reach up and diagonals with 1# ball, 5 each. Standing reach up and back with trunk rotation, 5 to each side. *Therapist facilitated upright posture and active trunk with each exercise. Ambulation with shopping cart x 70', CGA.   01/16/22 Nustep level 4 x 6 minutes Leg extension 5# 3x10 Leg curls 15# 3x10 Seated red tban rows and extensions 3x10 [place] HHA x 120 feet, with FWW 120' x 2, then with the FWW x 180 feet to the car In FWW, cone toe touches   12/27/21   Walking HHA 4x100'   Side stepping HHA    NuStep Level 4 x 5 minutes   Marches 2.5#   Hip abduction 2.5#   LAQ 2.5#   Red tband HS curls   Sit to stand HHA x6  11/17/21 Ambulation with RW in assessment- patietn tends to flex  forward, uses excessive hip flexion to clear feet compensating due to previously dragging feet. 80', CGA, stable. Practiced forward weiht hsifts progressing ot sit to stand from mat with hands clasped out in front to prevent posterior lean, S Standing step back into outward rotation, then weight shift back to place object on mat, 6 reps to each side, CGA. Ambulation while holding 1# ball in BUE to encourage upright posture, 1 x 70', VC to take longer steps and maintain upright posture. 4 square stepping to encourage improved balance and ability to change/wt shift in all directions. CGA 11/15/21 Nustep L3 x48mns TB HS curls 2x12 Standing heel taps on 6" 20reps Forward and side stepping over obstacles in //   Mini squats 2x10 Walking with quad cane   Standing ball toss 20 reps  OHP with red ball 2x10   11/07/21 Bike L2, 5 mins  LAQ 3# 2x10   TB hamstring curls seated 2x10   Banded ankle DF 2x10  Ladder drill for big steps forwards and sideways 2HHA    PATIENT EDUCATION: Education details: POC  Person educated: Patient and CArmed forces training and education officermethod: Explanation, Demonstration, TCorporate treasurercues, and Verbal cues Education comprehension: returned demonstration, verbal cues required, tactile cues required, and needs further education   HOME EXERCISE PROGRAM: Access Code: [email or web address]    GOALS: Goals reviewed with patient? Yes  SHORT TERM GOALS: Target date: 11/30/21  Patient will complete HEP Baseline: Goal status: met    LONG TERM GOALS: Target date: 02/16/22  Patient will demonstrate 4/5 strength in UE to be able to complete ADLs with minA Baseline:  Goal status: on going  2.  Patient will ambulate 540fwith minA or better to be able to navigate inside home Baseline:  Goal status: on going  3.  Patient will demonstrate >= 4/5 strength in LE to be able to complete transfers with less assistance and become household ambulator Baseline:  Goal status: ongoing  4.  Patient will score <63m40mon TUG using rolling walker and minA Baseline:  Goal status:on going   ASSESSMENT:  CLINICAL IMPRESSION:  Patient arrives very stiff and with difficulty initiating transfer. Treatment focused on trunk and postural strength and control in sit and stand, including weight shifts and rotation for balance. Ambulate with shopping cart to encourage upright psoture with improved step length. HEP updated, but forgot to print and give to family.  OBJECTIVE IMPAIRMENTS Abnormal gait, decreased activity tolerance, decreased balance, decreased cognition, decreased coordination, decreased knowledge of condition, decreased mobility, difficulty walking, decreased ROM, decreased strength, and decreased  safety awareness.   ACTIVITY LIMITATIONS sitting, standing, transfers, bed mobility, bathing, toileting, dressing, and locomotion level  PARTICIPATION LIMITATIONS: cleaning, interpersonal relationship, community activity, and home maintenance  PERSONAL FACTORS Age, Behavior pattern, and 3+ comorbidities: frequent falls, hx of strokes, memory loss  are also affecting patient's functional outcome.   REHAB POTENTIAL: Fair age, low functional level, and cognitive factors may cause difficulty with treatment  CLINICAL DECISION MAKING: Evolving/moderate complexity  EVALUATION COMPLEXITY: Moderate  PLAN: PT FREQUENCY: 2x/week  PT DURATION: 12 weeks  PLANNED INTERVENTIONS: Therapeutic exercises, Therapeutic activity, Neuromuscular re-education, Balance training, Gait training, Patient/Family education, Joint mobilization, Stair training, Wheelchair mobility training, and Re-evaluation  PLAN FOR NEXT SESSION: Print updated Hep and provide to family.  SusEthel RanaT 01/25/22 3:46 PM  01/25/2022, 3:46 PM

## 2022-01-25 NOTE — Patient Instructions (Signed)
[name] and family It was a pleasure speaking with you today.  Below is a summary of your health goals and summary of our recent visit. You can also view your updated Chronic Care Management Care plan through your MyChart account.    Patient Goals/Self-Care Activities Over the next 90 days, patient will:  Take medications as prescribed, Focus on medication adherence by using weekly pill container with alarms Discuss with neurologist tomorrow recommendation for aspirin and clopidogrel.  Contact provider or clinical pharmacist with medication questions.  See tips below for helping patients experiencing sundowning - like play soft music or comforting noise. Use sunlight during daytime to help with natural daily cycle.   Follow Up Plan: Telephone follow up appointment with care management team member scheduled for:  1 to 2 months.     As always if you have any questions or concerns especially regarding medications, please feel free to contact me either at the phone number below or with a MyChart message.   Keep up the good work!  Cherre Robins, PharmD Clinical Pharmacist [place] [phone] (direct line)  [phone] (main office number)   Patient verbalizes understanding of instructions and care plan provided today and agrees to view in [place]. Active MyChart status and patient understanding of how to access instructions and care plan via MyChart confirmed with patient.         ALZHEIMER'S CAREGIVING Tips for Coping with Sundowning  Late afternoon and early evening can be difficult for some people with Alzheimer's disease. They may experience sundowning--restlessness, agitation, irritability, or confusion that can begin or worsen as daylight begins to fade--often just when tired caregivers need a break.Woman with Alzheimer's experiencing sundowning  Sundowning can continue into the night, making it hard for people with Alzheimer's to fall asleep  and stay in bed. As a result, they and their caregivers may have trouble getting enough sleep and functioning well during the day.  Possible Causes The causes of sundowning are not well understood. One possibility is that Alzheimer's-related brain changes can affect a person's "biological clock," leading to confused sleep-wake cycles. This may result in agitation and other sundowning behaviors.  Other possible causes of sundowning include: Being overly tired Unmet needs such as hunger or thirst Depression Pain Boredom Coping with Sundowning Look for signs of sundowning in the late afternoon and early evening. These signs may include increased confusion or anxiety and behaviors such as pacing, wandering, or yelling. If you can, try to find the cause of the person's behavior.  If the person with Alzheimer's becomes agitated, listen calmly to his or her concerns and frustrations. Try to reassure the person that everything is OK and distract him or her from stressful or upsetting events.  You can also try these tips:  Reduce noise, clutter, or the number of people in the room. Try to distract the person with a favorite snack, object, or activity. For example, offer a drink, suggest a simple task like folding towels, or turn on a familiar TV show (but not the news or other shows that might be upsetting). Make early evening a quiet time of day. You might play soothing music, read, or go for a walk. You could also have a family member or friend call during this time. Adjust lighting, letting in natural light during the day, if possible, and try softer room lighting in the evening. Preventing Sundowning Being too tired can increase late-afternoon and early-evening restlessness. Try to avoid this situation  by helping the person:  Go outside or at least sit by the window--exposure to bright light can help reset the person's body clock Get physical activity or exercise each day Get daytime rest if  needed, but keep naps short and not too late in the day Get enough rest at night Avoid things that seem to make sundowning worse:  Do not serve coffee, cola, or other drinks with caffeine late in the day. Do not serve alcoholic drinks. They may add to confusion and anxiety. Do not plan too many activities during the day. A full schedule can be tiring.  If Problems Persist If sundowning continues to be a problem, seek medical advice. A medical exam may identify the cause of sundowning, such as pain, a sleep disorder or other illness, or a medication side effect.  If medication is prescribed to help the person relax and sleep better at night, be sure to find out about possible side effects. Some medications can increase the chances of dizziness, falls, and confusion. Doctors recommend using them only for short periods of time.  Sign up for email updates Receive weekly tips and resources on Alzheimer's disease and related dementias from NIA's Alzheimers.gov  Email Address For More Information About Sundowning NIA Alzheimer's and related Dementias Education and Referral [place] [phone] [email or web address] .SouthExposed.es DVDEnthusiasts.nl The [place] offers information and free print publications about Alzheimer's and related dementias for families, caregivers, and health professionals. [facility] staff answer telephone, email, and written requests and make referrals to local and national resources.  Alzheimers.gov www.alzheimers.gov Explore the Alzheimers.gov website for information and resources on Alzheimer's and related dementias from across the federal government.  [facility] http://www.harris-moore.net/  [facility] [phone] info'@[email or web address] .org www.caregiver.org  Engineer, technical sales.TipCertified.com.pt  This content is provided by the [employer] (NIA). NIA scientists and  other experts review this content to ensure it is accurate and up to date.  Content reviewed: Oct 06, 2015

## 2022-01-25 NOTE — Chronic Care Management (AMB) (Signed)
Chronic Care Management Pharmacy Note  01/25/2022 Name:  [name] MRN:  [id number] DOB:  [birth year]  Summary: Recent CVA - reviewed notes from neurologist notes and patient is to continue aspirin 59m daily  Insomnia: sleep has improved since quetiapine 210mdaily started. Daughter reports patient is sleeping 6 to 8 hours per night. He appears rested and most alert in the morning. She reports thought that lately he has had more confusion and agitation in the late afternoon / early evening. He is disoriented and tells care givers he want to go to work or home. Daughter stopped memantine because she was concered abotu side effects. He has gotten a little physical with caregiver.  Possible options:  1) non pharmacological therapy like aromatherapy, acupuncture, soft music. Also discussed exposing patient to sunlight during day and soft light during night. Provided handout. 2) melatonin - has already tries and per daughter was not helpful 3) could increase quetiapine dose but daughter is reluctant to increase dose.   4) SSRI - escitalopram 1090maily or citalopram 67m30mily 5) buspirone 5mg 71mce a day  Will discuss potential treatment options with PCP.   Subjective: [name] 89 y.88 year old male who is a primary patient of LowneAnn Held  The CCM team was consulted for assistance with disease management and care coordination needs.    Engaged with patient's daughter by telephone for follow up visit in response to provider referral for pharmacy case management and/or care coordination services.   Consent to Services:  The patient was given information about Chronic Care Management services, agreed to services, and gave verbal consent prior to initiation of services.  Please see initial visit note for detailed documentation.   Patient Care Team: LowneCarollee HerternnAlferd Apaas PCP - General (Family Medicine) EckarCherre Robins-CPP (Pharmacist) SethiGarvin Filaas  Consulting Physician (Neurology)  Recent office visits: 12/13/2021 - Fam Med (Dr LowneCarollee Herteroe Visit for insomnia. Wife recently decreased. Prescribed quetiapine 25mg 52medtime.  11/08/2021 - Fam Med (Dr Lowne-Carollee Herter for dysphagia. Referred to GI. Labs checked.  10/11/2021 - Fam Med (Dr Lowne-Carollee Herter for cough / bronchitis. Prescribed azithromycin. Referred for home health OT / PT; Ordered chest x-ray. 03/01/2021 - PCP (Dr Lowne)[facility] Visit. Changed losartan HCTZ to just losartan 50mg d35m due to fatigue, low BP and incresaed urination. Low adhernece to thyroid replacement therapy. TSH was 13.57. Restarted levothyroxine 100mcg d74m   09/07/2020 - PCP (Dr Lowne) aEtter Sjogrenvisit for pansinusitis - prescribed doxycycline 100mg twi31m day for 10 days and fluticsone nasal spray   Recent consult visits: 12/15/2021 - Physical Med (Dr KirsteinsLetta Pater subcortical infarction and frequent falls. Continue physical thearpy. F/U as needed.  12/15/2021 - Neuro (Dr Sethi) F/Leonie Manunar stroke. Checked labs. Started Tamenda started pack. Recommended stay on ASA 81mg dail75mr stroke prevention. Stopped clopidogrel 75mg. Aggr2mve risk modification - BP < 130/80 and LDL < 70 and A1c < 6.5%. F/U 3 months.  11/24/2021 - GI ([place], [place]ilverio Lay f[place]phagia. Dysphagia worse after stroke. Ordered barium swallow test. Started famotidine 40mg daily 75mce on clopidogrel)  10/03/2021 - Cardio (Dr Randoph) SeeLucretia FieldVS due to stenosis of right middle cerebral artery. Recommended 1 more month of ASA 81mg therapy100men clopidogrel monotherpay indefinitely. Ordered 30 day heart monitor. ood pressure is elevated today.  However given his intracranial stenosis and frequent falls, will not be more aggressive.  Continue doxazosin.  When he sees neurology recommend getting a formal recommendation for his blood pressure goal.  Recent Hospital Visits:  12/03/2021 - ED Visit for Chest pain and SOB. Was  visiting wife in hospital when he became emotional and clutched chest / had SOB. work up for cardiac  cause of CP was completed and negative. No med changes noted.   Objective:  Lab Results  Component Value Date   CREATININE 1.51 (H) 12/15/2021   CREATININE 1.62 (H) 12/03/2021   CREATININE 1.48 11/08/2021    Lab Results  Component Value Date   HGBA1C 5.8 (H) 06/29/2021   Last diabetic Eye exam: No results found for: "HMDIABEYEEXA"  Last diabetic Foot exam: No results found for: "HMDIABFOOTEX"      Component Value Date/Time   CHOL 133 11/08/2021 1513   TRIG 387.0 (H) 11/08/2021 1513   HDL 33.30 (L) 11/08/2021 1513   CHOLHDL 4 11/08/2021 1513   VLDL 77.4 (H) 11/08/2021 1513   LDLCALC 102 (H) 06/29/2021 2154   LDLDIRECT 51.0 11/08/2021 1513       Latest Ref Rng & Units 12/15/2021    9:47 AM 11/08/2021    3:13 PM 09/05/2021   11:52 AM  Hepatic Function  Total Protein 6.0 - 8.5 g/dL 7.0  6.9  7.0   Albumin 3.6 - 4.6 g/dL 4.3  4.0  3.9   AST 0 - 40 IU/L '24  20  27   ' ALT 0 - 44 IU/L 28  20  36   Alk Phosphatase 44 - 121 IU/L 86  72  74   Total Bilirubin 0.0 - 1.2 mg/dL 0.2  0.3  0.2     Lab Results  Component Value Date/Time   TSH 1.500 12/15/2021 09:47 AM   TSH 3.63 11/08/2021 03:13 PM       Latest Ref Rng & Units 12/15/2021    9:47 AM 12/03/2021    6:00 PM 11/08/2021    3:13 PM  CBC  WBC 3.4 - 10.8 x10E3/uL 10.0  8.9  9.4   Hemoglobin 13.0 - 17.7 g/dL 13.4  12.7  13.1   Hematocrit 37.5 - 51.0 % 42.0  39.8  39.5   Platelets 150 - 400 K/uL  197  209.0     Lab Results  Component Value Date/Time   VD25OH 49.40 04/13/2020 02:11 PM   VD25OH 43 11/15/2009 09:25 PM    Clinical ASCVD: Yes  The ASCVD Risk score (Arnett DK, et al., 2019) failed to calculate for the following reasons:   The 2019 ASCVD risk score is only valid for ages 44 to 71   The patient has a prior MI or stroke diagnosis     Social History   Tobacco Use  Smoking Status Never   Passive  exposure: Never  Smokeless Tobacco Never  Tobacco Comments   Verified by Daughter, [name] Hutchinson   BP Readings from Last 3 Encounters:  12/15/21 132/77  12/15/21 (!) 145/77  12/13/21 121/73   Pulse Readings from Last 3 Encounters:  12/15/21 (!) 58  12/15/21 62  12/03/21 65   Wt Readings from Last 3 Encounters:  12/15/21 123 lb (55.8 kg)  12/15/21 123 lb (55.8 kg)  12/03/21 123 lb 8 oz (56 kg)    Assessment: Review of patient past medical history, allergies, medications, health status, including review of consultants reports, laboratory and other test data, was performed as part of comprehensive evaluation and provision of chronic care management services.   SDOH:  (Social Determinants of Health)  assessments and interventions performed:  SDOH Interventions    Flowsheet Row Chronic Care Management from 11/15/2021 in [facility] [place] Management from 11/02/2021 in [facility] [place] Management from 06/02/2021 in [place] [facility] [place] Management from 03/07/2021 in [place] at [place] Interventions      Food Insecurity Interventions Intervention Not Indicated, Other (Comment)  [Verified by Daughter, [name] Hutchinson] -- -- --  Housing Interventions Intervention Not Indicated, Other (Comment)  [Verified by Daughter, [name] Hutchinson] -- -- --  Transportation Interventions Intervention Not Indicated, Other (Comment)  [Verified by Daughter, [name] Hutchinson] -- -- --  Financial Strain Interventions Intervention Not Indicated, Other (Comment)  [Verified by Daughter, [name] Hutchinson] -- Intervention Not Indicated Intervention Not Indicated  Physical Activity Interventions Intervention Not Indicated, Other (Comments)  [Currently receiving home health physical therapy twice a week] Other (Comments)  [physical  therapy] Other (Comments)  [requesting order for physical therapy at independent living facility - will need face to face appointment with PCP 1st.] --  Stress Interventions Intervention Not Indicated, Offered [employer], Other (Comment)  [Verified by Daughter, Environmental education officer Hutchinson] -- -- --  Social Connections Interventions Intervention Not Indicated, Other (Comment)  [Verified by Daughter, Environmental education officer Hutchinson] -- -- --         CCM Care Plan  Allergies  Allergen Reactions   Methotrexate Derivatives Other (See Comments)    Mouth sores / pancytopenia   Asa [Aspirin] Other (See Comments)    Gastric symptoms - can tolerate 81 mg aspirin   Penicillin G Rash    Did it involve swelling of the face/tongue/throat, SOB, or low BP? Unknown Did it involve sudden or severe rash/hives, skin peeling, or any reaction on the inside of your mouth or nose? Unknown Did you need to seek medical attention at a hospital or doctor's office? Unknown When did it last happen?   unknown    If all above answers are "NO", may proceed with cephalosporin use.    Medications Reviewed Today     Reviewed by Marcelina Morel, PT (Physical Therapist) on 01/25/22 at 1458  Med List Status: <None>   Medication Order Taking? Sig Documenting Provider Last Dose Status Informant  acetaminophen (TYLENOL) 500 MG tablet [id number] No Take 1 tablet (500 mg total) by mouth at bedtime.  Patient not taking: Reported on 01/25/2022   Bary Leriche, PA-C Not Taking Active   aspirin EC 81 MG tablet [id number] No Take 81 mg by mouth daily. Swallow whole. [provider] Taking Active   atorvastatin (LIPITOR) 40 MG tablet [id number] No Take 1 tablet (40 mg total) by mouth daily. [name] R, DO Taking Active   capsicum (ZOSTRIX) 0.075 % topical cream [id number] No Apply 1 application. topically 4 (four) times daily -  with meals and at bedtime. Be sure to wear glove and do not touch eye, mouth or nose (any mucous  membranes) Apply to left shoulder. Bary Leriche, PA-C Taking Active   docusate sodium (COLACE) 100 MG capsule [id number] No Take 2 capsules (200 mg total) by mouth daily. Bary Leriche, PA-C Taking Active   doxazosin (CARDURA) 1 MG tablet [id number] No Take 1 tablet (1 mg total) by mouth at bedtime. Skeet Latch, MD Taking Active   famotidine (PEPCID) 40 MG tablet [id number] No Take 1 tablet (40 mg total) by mouth daily.  Vladimir Crofts, PA-C Taking Active   levothyroxine (SYNTHROID) 100 MCG tablet [id number] No TAKE 1 TABLET BY MOUTH DAILY BEFORE BREAKFAST. [name], DO Taking Active   memantine (NAMENDA) 10 MG tablet [id number] No Take 1 tablet (10 mg total) by mouth 2 (two) times daily.  Patient not taking: Reported on 01/25/2022   Garvin Fila, MD Not Taking Active   mirabegron ER (MYRBETRIQ) 25 MG TB24 tablet [id number] No Take 25 mg by mouth at bedtime. [provider] Taking Active Child  Multiple Vitamins-Minerals (MULTIVITAMIN ADULT) CHEW [id number] No Chew 2 each by mouth daily. [provider] Taking Active Child  NONFORMULARY OR COMPOUNDED ITEM [id number] No 4 wheeled walker   #1  dx frequent falls, weakness Carollee Herter, Kendrick Fries R, DO Taking Active Child  QUEtiapine (SEROQUEL) 25 MG tablet [id number] No TAKE 1 TABLET BY MOUTH EVERYDAY AT BEDTIME [name], DO Taking Active   Med List Note [place] Crane, [place] D, CPhT 06/29/21 0206): Resident is [facility]            Patient Active Problem List   Diagnosis Date Noted   Requires assistance with activities of daily living (ADL) 10/11/2021   Bronchitis 10/11/2021   Trapezium fracture 09/09/2021   CMC arthritis, thumb, degenerative 09/09/2021   Left wrist pain 09/05/2021   Radial nerve palsy, left 09/05/2021   Acute shoulder pain due to trauma, left 07/29/2021   Slow transit constipation    Stroke (cerebrum) ([place]) 07/07/2021   CVA (cerebral vascular accident) [facility])  with intracranial vascular stenosis 06/29/2021   Intracranial vascular stenosis 06/29/2021   Prediabetes 06/29/2021   Normocytic anemia 06/29/2021   History of stroke 06/29/2021   Other drug-induced neutropenia ([place]) 06/09/2021   Malignant neoplasm of ascending colon ([place]) 06/09/2021   Stenosis of artery ([place]) 06/09/2021   Preventative health care 03/01/2021   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   Kidney disease, chronic, stage III (moderate, EGFR 30-59 ml/min) (HCC) 08/07/2020   Leukopenia 08/07/2020   Acute respiratory failure with hypoxia ([place]) 08/07/2020   SIRS (systemic inflammatory response syndrome) ([place]) 08/07/2020   Methotrexate adverse reaction, initial encounter    Acute pain of right shoulder 04/14/2020   Lower extremity edema 11/13/2019   Head trauma 11/13/2019   Dyslipidemia 11/13/2019   Memory loss 07/28/2019   Chronic right shoulder pain 07/28/2019   Frequent falls 07/28/2019   Balance problem 07/28/2019   Dysphagia 07/28/2019   Traumatic hemorrhage of cerebrum, unspecified, without loss of consciousness, initial encounter ([place])    Basilar artery stenosis with infarction ([place]) 12/18/2017   TIA (transient ischemic attack) 12/10/2017   Rheumatoid arthritis ([place])    Chest pain 06/02/2011   General medical examination 04/11/2011   PEPTIC ULCER DISEASE, HELICOBACTER PYLORI POSITIVE 07/12/2010   PERSONAL HISTORY MALIG NEOPLASM LARGE INTESTINE 05/18/2010   MITRAL VALVE DISORDERS 04/05/2009   HOARSENESS 12/04/2008   OSTEOPENIA 08/26/2008   HYPERTRIGLYCERIDEMIA 07/23/2008   EPISTAXIS 07/26/2007   ERECTILE DYSFUNCTION 02/22/2007   ECZEMA 12/07/2006   LEG CRAMPS 10/09/2006   Hypothyroidism 08/28/2006   Essential hypertension 08/28/2006   HAY FEVER 08/28/2006   INSOMNIA 08/28/2006    Immunization History  Administered Date(s) Administered   Fluad Quad(high Dose 65+) 04/13/2020   Influenza Split 02/21/2011, 02/27/2012, 02/20/2014   Influenza Whole 04/03/2007,  02/26/2008, 02/15/2009, 02/01/2010   Influenza, High Dose Seasonal PF 01/28/2016, 02/13/2019   Influenza,inj,Quad PF,6+ Mos 02/20/2014   Influenza-Unspecified 02/21/2011, 02/27/2012, 02/20/2014   PFIZER Comirnaty(Gray Top)Covid-19 Tri-Sucrose Vaccine  08/24/2020   PFIZER(Purple Top)SARS-COV-2 Vaccination 05/31/2019, 06/21/2019, 02/20/2020   Pfizer Covid-19 Vaccine Bivalent Booster 81yr & up 05/05/2021   Pneumococcal Conjugate-13 07/23/2008, 02/11/2015   Pneumococcal Polysaccharide-23 07/23/2008   Td 11/15/2009   Td,absorbed, Preservative Free, Adult Use, Lf Unspecified 11/15/2009   Tdap 03/09/2019    Conditions to be addressed/monitored: CAD, HTN, HLD, Hypertriglyceridemia, CKD Stage 3, Hypothyroidism, and Osteopenia  Care Plan : General Pharmacy (Adult)  Updates made by ECherre Robins RPH-CPP since 01/25/2022 12:00 AM     Problem: HTN; mixed hyperlipidemia; history of TIA; edema; hypothyroidism; memory loss; balance issues; osteopenia; anemia; RA; dyspepsia; colon CA   Priority: High  Onset Date: 03/07/2021     Long-Range Goal: Pharmacy Care Plan for Chronic Care Management   Start Date: 03/07/2021  Expected End Date: 08/24/2021  Recent Progress: On track  Priority: High  Note:   Current Barriers:  Unable to achieve control of hyperlipidemia and hypothyroidism  Unable to self administer medications as prescribed Does not adhere to prescribed medication regimen Multiple comorbidities Complex medication regimen Care giver support needed for patient with dementia  Pharmacist Clinical Goal(s):  Over the next 90 days, patient will achieve adherence to monitoring guidelines and medication adherence to achieve therapeutic efficacy achieve control of hypothyroidism and hyperlipidemia as evidenced by TSH in therapeutic range and LDL <70 achieve ability to self administer medications as prescribed through use of weekly medication container with reminders as evidenced by patient report  through collaboration with PharmD and provider.   Interventions: 1:1 collaboration with [name] YAlferd Apa DO regarding development and update of comprehensive plan of care as evidenced by provider attestation and co-signature Inter-disciplinary care team collaboration (see longitudinal plan of care) Comprehensive medication review performed; medication list updated in electronic medical record  Hypertension / BPH: Controlled; blood pressure goal <130/80 Current treatment: Doxazosin 11mdaily at beditme Denies hypotensive/hypertensive symptoms Interventions:  Discussed blood pressure goals Discussed limiting intake of sodium for blood pressure and edema  Hyperlipidemia, mixed / Recent CVA: LDL not at goal but Tg at goal; LDL goal <70 and Triglyceride goal <150 CVA 07/07/2021 and TIA noted 11/2017 Current treatment:  Aspirin 8180maily Atorvastatin 34m81mily  Took tricagrelor in past after TIA Neurologist recommended stop clopidogrel and continue only aspirin at appointment 12/15/2021 Interventions: Discussed lipid goals. Recommend recheck lipids with next labs.  Continue current therapy.    Hypothyroidism:  Controlled; Goal: TSH in therapeutic range Current treatment:  Levothyroxine 100mc53mke 1 tablet each morning Interventions: Discussed symptoms of low thyroid Discussed importance of taking levothyroxine daily   Osteopenia with low vitamin D:  Last DEXA 2012 showed T-Score of -1.8 at neck of right femur Noted to have frequent falls due to balance but no fractures Current therapy:  Multivitamin daily  Previous therapies: alendronate, vitamin D supplement, calcium supplement (stopped due to constipation) Has history of low vitamin D but last serum vitamin D was 49.4 (04/13/2020) Interventions: (addressed at previous visit)  Consider rechecking serum vitamin D with next labs Could consider rechecking DEXA if would change treatment plan  Fall prevention    Dementia / Difficulty Sleeping / Agitation:  Patient's wife died 11/1905-31-2023ient was having difficulty sleeping but is not taking quetiapine 25mg 28medtime. Daughter reports he is sleeping 6 to 8 hour per night.  Daughter stopped memantine due to concerns with side effects.  Increase in agitation reported in late afternoon / early evening. Also reports increased confusion with wanting to "go to work" or "go  home" yesterday. Patient has not started yet. Daughter want to try melatonin first. Taking 59m at bedtime Has 24/7 in home care givers currently but daughter is afraid they will not stay due to patient's agitation.  Interventions:  .Continue quetiapine 253mWill discuss with PCP potential options to help with agitation / possible sundowning.   Medication management Pharmacist Clinical Goal(s): Over the next 90 days, patient will work with PharmD and providers to maintain optimal medication adherence Current pharmacy[facility] Comprehensive medication review performed. Updated medication list  Reviewed refill history and assessed adherence Continue current medication management strategy Patient self care activities - Over the next 90 days, patient will: Focus on medication adherence by filling medications appropriately  Purchase weekly pill container with alarms / reminders Take medications as prescribed Report any questions or concerns to PharmD and/or provider(s)   Patient Goals/Self-Care Activities Over the next 90 days, patient will:  Take medications as prescribed, Focus on medication adherence by using weekly pill container with alarms Discuss with neurologist tomorrow recommendation for aspirin and clopidogrel.  Contact provider or clinical pharmacist with medication questions.  See tips for helping patients experiencing sundowning - like play soft music or comforting noise. Use sunlight during daytime to help with natural daily cycle.   Follow Up Plan:  Telephone follow up appointment with care management team member scheduled for:  1 to 2 months.          Medication Assistance: None required.  Patient affirms current coverage meets needs.  Patient's preferred pharmacy is:  [facility]: [phone]: [id number] [address]: [phone]: [phone]/pharmacy #[id number], [place] -[place]: [phone]: [phone] pill box? Yes Daughter endorses 85 to 90% compliance but she cannot be sure  Follow Up:  Patient agrees to Care Plan and Follow-up.  Plan: Telephone follow up appointment with care management team member scheduled for:  1 to 2 months  TammCherre RobinsarmD Clinical Pharmacist [facility]

## 2022-01-26 ENCOUNTER — Encounter (HOSPITAL_BASED_OUTPATIENT_CLINIC_OR_DEPARTMENT_OTHER): Payer: Self-pay

## 2022-02-03 DIAGNOSIS — H04123 Dry eye syndrome of bilateral lacrimal glands: Secondary | ICD-10-CM | POA: Diagnosis not present

## 2022-02-08 ENCOUNTER — Encounter: Payer: Self-pay | Admitting: Physical Therapy

## 2022-02-08 ENCOUNTER — Ambulatory Visit: Payer: Medicare Other | Admitting: Physical Therapy

## 2022-02-08 DIAGNOSIS — R2689 Other abnormalities of gait and mobility: Secondary | ICD-10-CM | POA: Diagnosis not present

## 2022-02-08 DIAGNOSIS — I6330 Cerebral infarction due to thrombosis of unspecified cerebral artery: Secondary | ICD-10-CM | POA: Diagnosis not present

## 2022-02-08 DIAGNOSIS — R279 Unspecified lack of coordination: Secondary | ICD-10-CM

## 2022-02-08 DIAGNOSIS — M6281 Muscle weakness (generalized): Secondary | ICD-10-CM

## 2022-02-08 NOTE — Therapy (Signed)
OUTPATIENT PHYSICAL THERAPY TREATMENT Progress Note Reporting Period 10/19/21 to 01/16/22  See note below for Objective Data and Assessment of Progress/Goals.      Patient Name: [name] MRN: [id number] DOB:[birth year], 88 y.o., male Today's Date: 02/08/2022   PCP: [name] REFERRING PROVIDER: Garnet Koyanagi Chase   PT End of Session - 02/08/22 1455     Visit Number 12    PT Start Time 6568    PT Stop Time 1540    PT Time Calculation (min) 44 min    Equipment Utilized During Treatment Gait belt    Activity Tolerance Patient tolerated treatment well    Behavior During Therapy WFL for tasks assessed/performed                    Past Medical History:  Diagnosis Date   Adenocarcinoma ([place]) 1985   colon s/p right hemicolectomy and chemotherapy. F/U with cancer center and Dr. Lucia Gaskins rotinely   Depression    Eczema    Epistaxis    f/u per ENT   Erectile dysfunction    Gastric ulcer 12/11   H-Pylori Tx, EGD 3-12: gastritis   Hay fever    Head injury 11/2017   with fall   Hyperlipemia    Hypertension    Hypothyroidism    Hypothyroid   Insomnia    Osteopenia     per DEXA 07-2008 (Rx fosamax)   Rheumatoid arthritis ([place])    Stroke ([place])     " balance issue"   Past Surgical History:  Procedure Laterality Date   COLON SURGERY     Right / Adenocarcinoma / Chemo   COLONOSCOPY  2005, 2008   Dr. Lucia Gaskins   INGUINAL HERNIA REPAIR     Right   IR ANGIO INTRA EXTRACRAN SEL COM CAROTID INNOMINATE BILAT MOD SED  12/13/2017   IR ANGIO VERTEBRAL SEL VERTEBRAL BILAT MOD SED  12/13/2017   IR PTA INTRACRANIAL  12/18/2017   RADIOLOGY WITH ANESTHESIA N/A 12/18/2017   Procedure: Angioplasty with stenting;  Surgeon: Luanne Bras, MD;  Location: [facility];  Service: Radiology;  Laterality: N/A;   Patient Active Problem List   Diagnosis Date Noted   Requires assistance with activities of daily living (ADL) 10/11/2021   Bronchitis 10/11/2021   Trapezium fracture  09/09/2021   CMC arthritis, thumb, degenerative 09/09/2021   Left wrist pain 09/05/2021   Radial nerve palsy, left 09/05/2021   Acute shoulder pain due to trauma, left 07/29/2021   Slow transit constipation    Stroke (cerebrum) ([place]) 07/07/2021   CVA (cerebral vascular accident) [facility]) with intracranial vascular stenosis 06/29/2021   Intracranial vascular stenosis 06/29/2021   Prediabetes 06/29/2021   Normocytic anemia 06/29/2021   History of stroke 06/29/2021   Other drug-induced neutropenia ([place]) 06/09/2021   Malignant neoplasm of ascending colon ([place]) 06/09/2021   Stenosis of artery ([place]) 06/09/2021   Preventative health care 03/01/2021   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   Kidney disease, chronic, stage III (moderate, EGFR 30-59 ml/min) ([place]) 08/07/2020   Leukopenia 08/07/2020   Acute respiratory failure with hypoxia ([place]) 08/07/2020   SIRS (systemic inflammatory response syndrome) ([place]) 08/07/2020   Methotrexate adverse reaction, initial encounter    Acute pain of right shoulder 04/14/2020   Lower extremity edema 11/13/2019   Head trauma 11/13/2019   Dyslipidemia 11/13/2019   Memory loss 07/28/2019   Chronic right shoulder pain 07/28/2019   Frequent falls 07/28/2019   Balance problem 07/28/2019   Dysphagia 07/28/2019   Traumatic  hemorrhage of cerebrum, unspecified, without loss of consciousness, initial encounter ([place])    Basilar artery stenosis with infarction ([place]) 12/18/2017   TIA (transient ischemic attack) 12/10/2017   Rheumatoid arthritis ([place])    Chest pain 06/02/2011   General medical examination 04/11/2011   PEPTIC ULCER DISEASE, HELICOBACTER PYLORI POSITIVE 07/12/2010   PERSONAL HISTORY MALIG NEOPLASM LARGE INTESTINE 05/18/2010   MITRAL VALVE DISORDERS 04/05/2009   HOARSENESS 12/04/2008   OSTEOPENIA 08/26/2008   HYPERTRIGLYCERIDEMIA 07/23/2008   EPISTAXIS 07/26/2007   ERECTILE DYSFUNCTION 02/22/2007   ECZEMA 12/07/2006   LEG CRAMPS 10/09/2006    Hypothyroidism 08/28/2006   Essential hypertension 08/28/2006   HAY FEVER 08/28/2006   INSOMNIA 08/28/2006    ONSET DATE: 07/07/2021  REFERRING DIAG: O71.21  THERAPY DIAG:  Cerebrovascular accident (CVA) due to thrombosis of cerebral artery ([facility])  Other abnormalities of gait and mobility  Muscle weakness (generalized)  Unspecified lack of coordination  Rationale for Evaluation and Treatment Rehabilitation  SUBJECTIVE:                                                                                                                                                                                              SUBJECTIVE STATEMENT:  Patient's daughter reports that he walked in to therapy today, very pleased.  Pt accompanied by: interpreter  PERTINENT HISTORY: colon surgery, head inury 11/2017, requires assistance w/ADLs, bronchitis 10/11/21, L radial nerve palsy 08/2021, hx of stroke and CVA, stage 3 kidney disease, memory loss, frequent falls (the most recent being 08/27/21 and he hurt his shoulder)  PAIN:  Are you having pain? No  PRECAUTIONS: Fall  WEIGHT BEARING RESTRICTIONS No  FALLS: Has patient fallen in last 6 months? Yes. 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, historic buildings and History of cognitive impairments - at baseline Hx of intermittent confusion, unable to answer questions with full understanding   LOWER EXTREMITY ROM:   no objective measurements taken, but noticeable hip tightness with gait,  and IR/ER tightness when tested in wheelchair  Active  Right Eval Left Eval  Hip flexion    Hip extension    Hip abduction    Hip adduction    Hip internal  rotation    Hip external rotation    Knee flexion    Knee extension    Ankle dorsiflexion    Ankle plantarflexion    Ankle inversion    Ankle eversion     (Blank rows = not tested)  LOWER EXTREMITY MMT:    MMT Right Eval Left Eval  Hip flexion 4- 4-  Hip extension    Hip abduction 3+ 3+  Hip adduction 4 4  Hip internal rotation    Hip external rotation    Knee flexion 4- 4-  Knee extension 4- 4-  Ankle dorsiflexion 4 4  Ankle plantarflexion 4 4  Ankle inversion    Ankle eversion    (Blank rows = not tested)  UPPER EXTREMITY ROM:     Active  Right Eval Left Eval  Shoulder flexion 125 110  Shoulder extension    Shoulder abduction 73 90  Shoulder adduction    Shoulder IR    Shoulder ER [facility] WFL  Elbow flexion WNL WNL  Elbow extension WNL WNL   (Blank rows = not tested)  UPPER EXTREMITY MMT:     MMT Right Eval Left Eval  Shoulder flexion 3 3  Shoulder extension    Shoulder abduction 2+ 2+  Shoulder adduction    Shoulder IR 3+ 3+  Shoulder ER 3+ 3+  Elbow flexion 4- 4-  Elbow extension 4- 4-  (Blank rows = not tested)  BED MOBILITY:  TBD  TRANSFERS: Assistive device utilized: Environmental consultant - 2 wheeled and Wheelchair (manual)  Sit to stand: Mod A Stand to sit: Mod A Patient has a tendency to push backwards with transfers  GAIT: Gait pattern: step to pattern, decreased step length- Right, decreased step length- Left, decreased stride length, shuffling, ataxic, trendelenburg, decreased trunk rotation, trunk flexed, and wide BOS Distance walked: 36f Assistive device utilized: Walker - 2 wheeled Level of assistance: Mod A Comments: modA to stand from w/c and walk 17f unsteadiness on feet especially with turns, slow gait, wide BOS  FUNCTIONAL TESTs:  5 times sit to stand: TBD (unable to do w/o  modA) Timed up and go (TUG): 75m84m30sec w/modA-maxA   TODAY'S TREATMENT:  02/08/22 NuStep x 6 minutes, L5, including 30 sec fast and 30 sec against increased resistance, U and LE. Practiced forward weight shifts, then sit to stand x 5 minutes with education for position of head, chest, and hands and feet. Initially min a, progressed to CG-S. Postural training and balance training, including step back with rotation and B side lunges, trying to speed up movements. Standing shoulder horizontal abd with yellow Tband and diagonals for balance and posture Ambulated x 60' while holding 1# dumbell in BUE, no AD, CGA, VC for posture and step length.  01/25/22 NuStep U and LE, L3 x 5 minutes Seated trunk stability, had difficulty understanding opposite arm and leg raise Seated reach up and reach into diagonals with 1# ball in BUE Seated forward weight shifts with hands on RW, push it forward x 3. Last one, moved sit to stand Sit to stand x 10 reps. Standing reach up and diagonals with 1# ball, 5 each. Standing reach up and back with trunk rotation, 5 to each side. *Therapist facilitated upright posture and active trunk with each exercise. Ambulation with shopping cart x 70', CGA.   01/16/22 Nustep level 4 x 6 minutes Leg extension 5# 3x10 Leg curls 15# 3x10 Seated red tban rows and extensions 3x10 [place] HHA x 120 feet, with FWW  120' x 2, then with the FWW x 180 feet to the car In [place], cone toe touches   12/27/21   Walking HHA 4x100'   Side stepping HHA    NuStep Level 4 x 5 minutes   Marches 2.5#   Hip abduction 2.5#   LAQ 2.5#   Red tband HS curls   Sit to stand HHA x6  11/17/21 Ambulation with RW in assessment- patietn tends to flex forward, uses excessive hip flexion to clear feet compensating due to previously dragging feet. 80', CGA, stable. Practiced forward weiht hsifts progressing ot sit to stand from mat with hands clasped out in front to prevent posterior lean, S Standing step  back into outward rotation, then weight shift back to place object on mat, 6 reps to each side, CGA. Ambulation while holding 1# ball in BUE to encourage upright posture, 1 x 70', VC to take longer steps and maintain upright posture. 4 square stepping to encourage improved balance and ability to change/wt shift in all directions. CGA 11/15/21 Nustep L3 x30mns TB HS curls 2x12 Standing heel taps on 6" 20reps Forward and side stepping over obstacles in //  Mini squats 2x10 Walking with quad cane   Standing ball toss 20 reps  OHP with red ball 2x10   11/07/21 Bike L2, 5 mins  LAQ 3# 2x10   TB hamstring curls seated 2x10   Banded ankle DF 2x10  Ladder drill for big steps forwards and sideways 2HHA    PATIENT EDUCATION: Education details: POC  Person educated: Patient and CArmed forces training and education officermethod: Explanation, Demonstration, TCorporate treasurercues, and Verbal cues Education comprehension: returned demonstration, verbal cues required, tactile cues required, and needs further education   HOME EXERCISE PROGRAM: Access Code: [email or web address]    GOALS: Goals reviewed with patient? Yes  SHORT TERM GOALS: Target date: 11/30/21  Patient will complete HEP Baseline: Goal status: met    LONG TERM GOALS: Target date: 02/16/22  Patient will demonstrate 4/5 strength in UE to be able to complete ADLs with minA Baseline:  Goal status: on going  2.  Patient will ambulate 546fwith minA or better to be able to navigate inside home Baseline:  Goal status: on going  3.  Patient will demonstrate >= 4/5 strength in LE to be able to complete transfers with less assistance and become household ambulator Baseline:  Goal status: ongoing  4.  Patient will score <29m72mon TUG using rolling walker and minA Baseline:  Goal status:on going   ASSESSMENT:  CLINICAL IMPRESSION:  Patient reports no problems, walked in to therapy with RW, which is an improvement. Treatment focused on sit to stand transfers  as he still tends to lean back. He was able to improve and stand with light CGA. Treatment focused on postural control and balance, trying to get him to move in bigger steps and different directions. He requires a bit of repetition, but cna then perform each activity with CGA.  OBJECTIVE IMPAIRMENTS Abnormal gait, decreased activity tolerance, decreased balance, decreased cognition, decreased coordination, decreased knowledge of condition, decreased mobility, difficulty walking, decreased ROM, decreased strength, and decreased safety awareness.   ACTIVITY LIMITATIONS sitting, standing, transfers, bed mobility, bathing, toileting, dressing, and locomotion level  PARTICIPATION LIMITATIONS: cleaning, interpersonal relationship, community activity, and home maintenance  PERSONAL FACTORS Age, Behavior pattern, and 3+ comorbidities: frequent falls, hx of strokes, memory loss  are also affecting patient's functional outcome.   REHAB POTENTIAL: Fair age, low functional level, and cognitive factors may cause difficulty  with treatment  CLINICAL DECISION MAKING: Evolving/moderate complexity  EVALUATION COMPLEXITY: Moderate  PLAN: PT FREQUENCY: 2x/week  PT DURATION: 12 weeks  PLANNED INTERVENTIONS: Therapeutic exercises, Therapeutic activity, Neuromuscular re-education, Balance training, Gait training, Patient/Family education, Joint mobilization, Stair training, Wheelchair mobility training, and Re-evaluation  PLAN FOR NEXT SESSION: Print updated Hep and provide to family.  Ethel Rana DPT 02/08/22 4:35 PM  02/08/2022, 4:35 PM

## 2022-02-13 NOTE — Progress Notes (Signed)
Kindly inform the patient that 30-day heart monitor study did not reveal any evidence of atrial fibrillation or any worrisome cardiac arrhythmia

## 2022-02-14 ENCOUNTER — Encounter: Payer: Self-pay | Admitting: Physical Therapy

## 2022-02-14 ENCOUNTER — Ambulatory Visit (INDEPENDENT_AMBULATORY_CARE_PROVIDER_SITE_OTHER): Payer: Medicare Other | Admitting: Family Medicine

## 2022-02-14 ENCOUNTER — Encounter: Payer: Self-pay | Admitting: Family Medicine

## 2022-02-14 ENCOUNTER — Ambulatory Visit: Payer: Medicare Other | Admitting: Physical Therapy

## 2022-02-14 VITALS — BP 110/70 | HR 77 | Temp 98.4°F | Resp 16 | Ht 59.0 in | Wt 119.6 lb

## 2022-02-14 DIAGNOSIS — Z741 Need for assistance with personal care: Secondary | ICD-10-CM

## 2022-02-14 DIAGNOSIS — I6330 Cerebral infarction due to thrombosis of unspecified cerebral artery: Secondary | ICD-10-CM | POA: Diagnosis not present

## 2022-02-14 DIAGNOSIS — R296 Repeated falls: Secondary | ICD-10-CM | POA: Diagnosis not present

## 2022-02-14 DIAGNOSIS — M6281 Muscle weakness (generalized): Secondary | ICD-10-CM

## 2022-02-14 DIAGNOSIS — S06360A Traumatic hemorrhage of cerebrum, unspecified, without loss of consciousness, initial encounter: Secondary | ICD-10-CM | POA: Diagnosis not present

## 2022-02-14 DIAGNOSIS — I10 Essential (primary) hypertension: Secondary | ICD-10-CM

## 2022-02-14 DIAGNOSIS — R279 Unspecified lack of coordination: Secondary | ICD-10-CM | POA: Diagnosis not present

## 2022-02-14 DIAGNOSIS — R2689 Other abnormalities of gait and mobility: Secondary | ICD-10-CM | POA: Diagnosis not present

## 2022-02-14 DIAGNOSIS — E785 Hyperlipidemia, unspecified: Secondary | ICD-10-CM | POA: Diagnosis not present

## 2022-02-14 DIAGNOSIS — Z111 Encounter for screening for respiratory tuberculosis: Secondary | ICD-10-CM

## 2022-02-14 DIAGNOSIS — F0280 Dementia in other diseases classified elsewhere without behavioral disturbance: Secondary | ICD-10-CM

## 2022-02-14 MED ORDER — LORAZEPAM 0.5 MG PO TABS: 0.5000 mg | ORAL_TABLET | Freq: Two times a day (BID) | ORAL | 1 refills | Status: AC | PRN

## 2022-02-14 MED ORDER — MIRABEGRON ER 25 MG PO TB24: 25.0000 mg | ORAL_TABLET | Freq: Every day | ORAL | 3 refills | Status: AC

## 2022-02-14 NOTE — Assessment & Plan Note (Signed)
fl2 to fill out when we receive it

## 2022-02-14 NOTE — Patient Instructions (Signed)
Dementia Caregiver Guide Dementia is a term used to describe a number of symptoms that affect memory and thinking. The most common symptoms include: Memory loss. Trouble with language and communication. Trouble concentrating. Poor judgment and problems with reasoning. Wandering from home or public places. Extreme anxiety or depression. Being suspicious or having angry outbursts and accusations. Child-like behavior and language. Dementia can be frightening and confusing. And taking care of someone with dementia can be challenging. This guide provides tips to help you when providing care for a person with dementia. How to help manage lifestyle changes Dementia usually gets worse slowly over time. In the early stages, people with dementia can stay independent and safe with some help. In later stages, they need help with daily tasks such as dressing, grooming, and using the bathroom. There are actions you can take to help a person manage his or her life while living with this condition. Communicating When the person is talking or seems frustrated, make eye contact and hold the person's hand. Ask specific questions that need yes or no answers. Use simple words, short sentences, and a calm voice. Only give one direction at a time. When offering choices, limit the person to just one or two. Avoid correcting the person in a negative way. If the person is struggling to find the right words, gently try to help him or her. Preventing injury  Keep floors clear of clutter. Remove rugs, magazine racks, and floor lamps. Keep hallways well lit, especially at night. Put a handrail and nonslip mat in the bathtub or shower. Put childproof locks on cabinets that contain dangerous items, such as medicines, alcohol, guns, toxic cleaning items, sharp tools or utensils, matches, and lighters. For doors to the outside of the house, put the locks in places where the person cannot see or reach them easily. This will  help ensure that the person does not wander out of the house and get lost. Be prepared for emergencies. Keep a list of emergency phone numbers and addresses in a convenient area. Remove car keys and lock garage doors so that the person does not try to get in the car and drive. Have the person wear a bracelet that tracks locations and identifies the person as having memory problems. This should be worn at all times for safety. Helping with daily life  Keep the person on track with his or her routine. Try to identify areas where the person may need help. Be supportive, patient, calm, and encouraging. Gently remind the person that adjusting to changes takes time. Help with the tasks that the person has asked for help with. Keep the person involved in daily tasks and decisions as much as possible. Encourage conversation, but try not to get frustrated if the person struggles to find words or does not seem to appreciate your help. How to recognize stress Look for signs of stress in yourself and in the person you are caring for. If you notice signs of stress, take steps to manage it. Symptoms of stress include: Feeling anxious, irritable, frustrated, or angry. Denying that the person has dementia or that his or her symptoms will not improve. Feeling depressed, hopeless, or unappreciated. Difficulty sleeping. Difficulty concentrating. Developing stress-related health problems. Feeling like you have too little time for your own life. Follow these instructions at home: Take care of your health Make sure that you and the person you are caring for: Get regular sleep. Exercise regularly. Eat regular, nutritious meals. Take over-the-counter and prescription medicines only   as told by your health care providers. Drink enough fluid to keep your urine pale yellow. Attend all scheduled health care appointments.  General instructions Join a support group with others who are caregivers. Ask about  respite care resources. Respite care can provide short-term care for the person so that you can have a regular break from the stress of caregiving. Consider any safety risks and take steps to avoid them. Organize medicines in a pill box for each day of the week. Create a plan to handle any legal or financial matters. Get legal or financial advice if needed. Keep a calendar in a central location to remind the person of appointments or other activities. Where to find support: Many individuals and organizations offer support. These include: Support groups for people with dementia. Support groups for caregivers. Counselors or therapists. Home health care services. Adult day care centers. Where to find more information Centers for Disease Control and Prevention: www.cdc.gov Alzheimer's Association: www.alz.org Family Caregiver Alliance: www.caregiver.org Alzheimer's Foundation of [place]: www.alzfdn.org Contact a health care provider if: The person's health is rapidly getting worse. You are no longer able to care for the person. Caring for the person is affecting your physical and emotional health. You are feeling depressed or anxious about caring for the person. Get help right away if: The person threatens himself or herself, you, or anyone else. You feel depressed or sad, or feel that you want to harm yourself. If you ever feel like your loved one may hurt himself or herself or others, or if he or she shares thoughts about taking his or her own life, get help right away. You can go to your nearest emergency department or: Call your local emergency services (911 in the [place] Call a suicide crisis helpline, such as the National Suicide Prevention Lifeline at 1-800-273-8255 or 988 in the [place] This is open 24 hours a day in the [place] Text the Crisis Text Line at 741741 (in the [place] Summary Dementia is a term used to describe a number of symptoms that affect memory and thinking. Dementia  usually gets worse slowly over time. Take steps to reduce the person's risk of injury and to plan for future care. Caregivers need support, relief from caregiving, and time for their own lives. This information is not intended to replace advice given to you by your health care provider. Make sure you discuss any questions you have with your health care provider. Document Revised: 12/01/2020 Document Reviewed: 09/22/2019 Elsevier Patient Education  2023 [employer].  

## 2022-02-14 NOTE — Assessment & Plan Note (Signed)
And due to dementia he forgets to use walker and falls fl2 to be filled out for respite care

## 2022-02-14 NOTE — Assessment & Plan Note (Signed)
Encourage heart healthy diet such as MIND or DASH diet, increase exercise, avoid trans fats, simple carbohydrates and processed foods, consider a krill or fish or flaxseed oil cap daily.  °

## 2022-02-14 NOTE — Assessment & Plan Note (Signed)
Well controlled, no changes to meds. Encouraged heart healthy diet such as the DASH diet and exercise as tolerated.  °

## 2022-02-14 NOTE — Assessment & Plan Note (Signed)
Stable

## 2022-02-14 NOTE — Progress Notes (Signed)
Established Patient Office Visit  Subjective   Patient ID: [name], male    DOB: 1928/06/19  Age: 88 y.o. MRN: [id number]  Chief Complaint  Patient presents with   Memory Loss    HPI Pt here with his daughter and interpreter to have paperwork filled out for memory care but we have not received the paperwork.  They need a med for night time to calm him down --- he is going into respite care while pt daughter is out of town for work.  No new complaints Patient Active Problem List   Diagnosis Date Noted   Requires assistance with activities of daily living (ADL) 10/11/2021   Bronchitis 10/11/2021   Trapezium fracture 09/09/2021   CMC arthritis, thumb, degenerative 09/09/2021   Left wrist pain 09/05/2021   Radial nerve palsy, left 09/05/2021   Acute shoulder pain due to trauma, left 07/29/2021   Slow transit constipation    Stroke (cerebrum) ([place]) 07/07/2021   CVA (cerebral vascular accident) [facility]) with intracranial vascular stenosis 06/29/2021   Intracranial vascular stenosis 06/29/2021   Prediabetes 06/29/2021   Normocytic anemia 06/29/2021   History of stroke 06/29/2021   Other drug-induced neutropenia ([place]) 06/09/2021   Malignant neoplasm of ascending colon ([place]) 06/09/2021   Stenosis of artery ([place]) 06/09/2021   Preventative health care 03/01/2021   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   Kidney disease, chronic, stage III (moderate, EGFR 30-59 ml/min) ([place]) 08/07/2020   Leukopenia 08/07/2020   Acute respiratory failure with hypoxia ([place]) 08/07/2020   SIRS (systemic inflammatory response syndrome) ([place]) 08/07/2020   Methotrexate adverse reaction, initial encounter    Acute pain of right shoulder 04/14/2020   Lower extremity edema 11/13/2019   Head trauma 11/13/2019   Dyslipidemia 11/13/2019   Memory loss 07/28/2019   Chronic right shoulder pain 07/28/2019   Frequent falls 07/28/2019   Balance problem 07/28/2019   Dysphagia 07/28/2019   Traumatic hemorrhage of cerebrum,  unspecified, without loss of consciousness, initial encounter [facility])    Basilar artery stenosis with infarction ([place]) 12/18/2017   TIA (transient ischemic attack) 12/10/2017   Rheumatoid arthritis ([place])    Chest pain 06/02/2011   General medical examination 04/11/2011   PEPTIC ULCER DISEASE, HELICOBACTER PYLORI POSITIVE 07/12/2010   PERSONAL HISTORY MALIG NEOPLASM LARGE INTESTINE 05/18/2010   MITRAL VALVE DISORDERS 04/05/2009   HOARSENESS 12/04/2008   OSTEOPENIA 08/26/2008   HYPERTRIGLYCERIDEMIA 07/23/2008   EPISTAXIS 07/26/2007   ERECTILE DYSFUNCTION 02/22/2007   ECZEMA 12/07/2006   LEG CRAMPS 10/09/2006   Hypothyroidism 08/28/2006   Essential hypertension 08/28/2006   HAY FEVER 08/28/2006   INSOMNIA 08/28/2006   Past Medical History:  Diagnosis Date   Adenocarcinoma ([place]) 1985   colon s/p right hemicolectomy and chemotherapy. F/U with cancer center and Dr. Lucia Gaskins rotinely   Depression    Eczema    Epistaxis    f/u per ENT   Erectile dysfunction    Gastric ulcer 12/11   H-Pylori Tx, EGD 3-12: gastritis   Hay fever    Head injury 11/2017   with fall   Hyperlipemia    Hypertension    Hypothyroidism    Hypothyroid   Insomnia    Osteopenia     per DEXA 07-2008 (Rx fosamax)   Rheumatoid arthritis ([place])    Stroke ([place])     " balance issue"   Past Surgical History:  Procedure Laterality Date   COLON SURGERY     Right / Adenocarcinoma / Chemo   COLONOSCOPY  2005, 2008  Dr. Harlene Ramus HERNIA REPAIR     Right   IR ANGIO INTRA EXTRACRAN SEL COM CAROTID INNOMINATE BILAT MOD SED  12/13/2017   IR ANGIO VERTEBRAL SEL VERTEBRAL BILAT MOD SED  12/13/2017   IR PTA INTRACRANIAL  12/18/2017   RADIOLOGY WITH ANESTHESIA N/A 12/18/2017   Procedure: Angioplasty with stenting;  Surgeon: Luanne Bras, MD;  Location: [place];  Service: Radiology;  Laterality: N/A;   Social History   Tobacco Use   Smoking status: Never    Passive exposure: Never   Smokeless tobacco: Never    Tobacco comments:    Verified by Daughter, [name]  Vaping Use   Vaping Use: Never used  Substance Use Topics   Alcohol use: No    Alcohol/week: 0.0 standard drinks of alcohol   Drug use: No   Social History   Socioeconomic History   Marital status: Widowed    Spouse name: Not on file   Number of children: 2   Years of education: 12th   Highest education level: 12th grade  Occupational History    Employer: RETIRED  Tobacco Use   Smoking status: Never    Passive exposure: Never   Smokeless tobacco: Never   Tobacco comments:    Verified by Daughter, [name]  Vaping Use   Vaping Use: Never used  Substance and Sexual Activity   Alcohol use: No    Alcohol/week: 0.0 standard drinks of alcohol   Drug use: No   Sexual activity: Not Currently  Other Topics Concern   Not on file  Social History Narrative   Original from [place]   Vegetarian   Daily caffeine use one per day   Social Determinants of Health   Financial Resource Strain: Low Risk  (11/16/2021)   Overall Financial Resource Strain (CARDIA)    Difficulty of Paying Living Expenses: Not hard at all  Food Insecurity: No Food Insecurity (11/16/2021)   Hunger Vital Sign    Worried About Running Out of Food in the Last Year: Never true    Ran Out of Food in the Last Year: Never true  Transportation Needs: No Transportation Needs (11/16/2021)   PRAPARE - Hydrologist (Medical): No    Lack of Transportation (Non-Medical): No  Physical Activity: Insufficiently Active (11/16/2021)   Exercise Vital Sign    Days of Exercise per Week: 2 days    Minutes of Exercise per Session: 50 min  Stress: No Stress Concern Present (11/16/2021)   [place]    Feeling of Stress : Not at all  Social Connections: Moderately Isolated (11/16/2021)   Social Connection and Isolation Panel [NHANES]    Frequency of Communication with  Friends and Family: More than three times a week    Frequency of Social Gatherings with Friends and Family: More than three times a week    Attends Religious Services: More than 4 times per year    Active Member of [employer] or Organizations: No    Attends Archivist Meetings: Never    Marital Status: Widowed  Intimate Partner Violence: Not At Risk (11/16/2021)   Humiliation, Afraid, Rape, and Kick questionnaire    Fear of Current or Ex-Partner: No    Emotionally Abused: No    Physically Abused: No    Sexually Abused: No   Family Status  Relation Name Status   Mother  Deceased   Father  Deceased   Neg  Hx  (Not Specified)   Family History  Problem Relation Age of Onset   Diabetes Neg Hx    Heart attack Neg Hx    Colon cancer Neg Hx    Prostate cancer Neg Hx    Allergies  Allergen Reactions   Methotrexate Derivatives Other (See Comments)    Mouth sores / pancytopenia   Asa [Aspirin] Other (See Comments)    Gastric symptoms - can tolerate 81 mg aspirin   Penicillin G Rash    Did it involve swelling of the face/tongue/throat, SOB, or low BP? Unknown Did it involve sudden or severe rash/hives, skin peeling, or any reaction on the inside of your mouth or nose? Unknown Did you need to seek medical attention at a hospital or doctor's office? Unknown When did it last happen?   unknown    If all above answers are "NO", may proceed with cephalosporin use.      ROS    Objective:     BP 110/70 (BP Location: Left Arm, Patient Position: Sitting, Cuff Size: Normal)   Pulse 77   Temp 98.4 F (36.9 C) (Oral)   Resp 16   Ht 4' 11"  (1.499 m)   Wt 119 lb 9.6 oz (54.3 kg)   SpO2 97%   BMI 24.16 kg/m  BP Readings from Last 3 Encounters:  02/14/22 110/70  12/15/21 132/77  12/15/21 (!) 145/77   Wt Readings from Last 3 Encounters:  02/14/22 119 lb 9.6 oz (54.3 kg)  12/15/21 123 lb (55.8 kg)  12/15/21 123 lb (55.8 kg)   SpO2 Readings from Last 3 Encounters:  02/14/22 97%   12/15/21 98%  12/03/21 99%      Physical Exam Vitals and nursing note reviewed.  Constitutional:      Appearance: He is well-developed.  HENT:     Head: Normocephalic and atraumatic.  Eyes:     Pupils: Pupils are equal, round, and reactive to light.  Neck:     Thyroid: No thyromegaly.  Cardiovascular:     Rate and Rhythm: Normal rate and regular rhythm.     Heart sounds: No murmur heard. Pulmonary:     Effort: Pulmonary effort is normal. No respiratory distress.     Breath sounds: Normal breath sounds. No wheezing or rales.  Chest:     Chest wall: No tenderness.  Musculoskeletal:     Cervical back: Normal range of motion and neck supple.     Right hip: Tenderness present. Normal range of motion. Normal strength.     Left hip: Tenderness present. Normal range of motion. Normal strength.     Right foot: Bony tenderness present. No swelling.     Left foot: Bony tenderness present. No swelling.  Skin:    General: Skin is warm and dry.  Neurological:     Mental Status: He is alert. He is disoriented.     Comments: Pt understands english but speaks back in chinese  Pt is in wheelchair --- uses walker at home    Psychiatric:        Behavior: Behavior normal.        Thought Content: Thought content normal.        Judgment: Judgment normal.      No results found for any visits on 02/14/22.  Last CBC Lab Results  Component Value Date   WBC 10.0 12/15/2021   HGB 13.4 12/15/2021   HCT 42.0 12/15/2021   MCV 91 12/15/2021   MCH 29.1 12/15/2021  RDW 13.3 12/15/2021   PLT 197 35/00/9381   Last metabolic panel Lab Results  Component Value Date   GLUCOSE 113 (H) 12/15/2021   NA 143 12/15/2021   K 4.0 12/15/2021   CL 102 12/15/2021   CO2 26 12/15/2021   BUN 22 12/15/2021   CREATININE 1.51 (H) 12/15/2021   EGFR 43 (L) 12/15/2021   CALCIUM 9.7 12/15/2021   PROT 7.0 12/15/2021   ALBUMIN 4.3 12/15/2021   LABGLOB 2.7 12/15/2021   AGRATIO 1.6 12/15/2021   BILITOT  0.2 12/15/2021   ALKPHOS 86 12/15/2021   AST 24 12/15/2021   ALT 28 12/15/2021   ANIONGAP 8 12/03/2021   Last lipids Lab Results  Component Value Date   CHOL 133 11/08/2021   HDL 33.30 (L) 11/08/2021   LDLCALC 102 (H) 06/29/2021   LDLDIRECT 51.0 11/08/2021   TRIG 387.0 (H) 11/08/2021   CHOLHDL 4 11/08/2021   Last hemoglobin A1c Lab Results  Component Value Date   HGBA1C 5.8 (H) 06/29/2021   Last thyroid functions Lab Results  Component Value Date   TSH 1.500 12/15/2021   T4TOTAL 8.8 12/15/2021   Last vitamin D Lab Results  Component Value Date   VD25OH 49.40 04/13/2020   Last vitamin B12 and Folate Lab Results  Component Value Date   VITAMINB12 1,163 12/15/2021   FOLATE 45.8 08/10/2020      The ASCVD Risk score (Arnett DK, et al., 2019) failed to calculate for the following reasons:   The 2019 ASCVD risk score is only valid for ages 53 to 63   The patient has a prior MI or stroke diagnosis    Assessment & Plan:   Problem List Items Addressed This Visit       Unprioritized   Essential hypertension (Chronic)    Well controlled, no changes to meds. Encouraged heart healthy diet such as the DASH diet and exercise as tolerated.       Traumatic hemorrhage of cerebrum, unspecified, without loss of consciousness, initial encounter ([place])    Stable        Requires assistance with activities of daily living (ADL)    fl2 to fill out when we receive it       Frequent falls    And due to dementia he forgets to use walker and falls fl2 to be filled out for respite care       Dyslipidemia    Encourage heart healthy diet such as MIND or DASH diet, increase exercise, avoid trans fats, simple carbohydrates and processed foods, consider a krill or fish or flaxseed oil cap daily.       Other Visit Diagnoses     Dementia associated with other underlying disease, without behavioral disturbance, psychotic disturbance, mood disturbance, or anxiety, unspecified  dementia severity ([place])    -  Primary   Relevant Medications   LORazepam (ATIVAN) 0.5 MG tablet   Other Relevant Orders   QuantiFERON-TB Gold Plus   Encounter for TB tine test       Relevant Orders   QuantiFERON-TB Gold Plus       No follow-ups on file.    [name], DO

## 2022-02-14 NOTE — Assessment & Plan Note (Signed)
Lab Results  Component Value Date   TSH 1.500 12/15/2021   con't the synthroid

## 2022-02-14 NOTE — Therapy (Signed)
OUTPATIENT PHYSICAL THERAPY TREATMENT Progress Note Reporting Period 10/19/21 to 01/16/22  See note below for Objective Data and Assessment of Progress/Goals.      Patient Name: [name] MRN: [id number] DOB:Nov 28, 1928, 88 y.o., male Today's Date: 02/14/2022   PCP: [name] REFERRING PROVIDER: [name]   PT End of Session - 02/14/22 1306     Visit Number 13    Date for PT Re-Evaluation 02/16/22    Authorization Type [employer]    PT Start Time 5176    PT Stop Time 1607    PT Time Calculation (min) 49 min    Activity Tolerance Patient tolerated treatment well    Behavior During Therapy WFL for tasks assessed/performed                    Past Medical History:  Diagnosis Date   Adenocarcinoma ([place]) 1985   colon s/p right hemicolectomy and chemotherapy. F/U with cancer center and Dr. Lucia Gaskins rotinely   Depression    Eczema    Epistaxis    f/u per ENT   Erectile dysfunction    Gastric ulcer 12/11   H-Pylori Tx, EGD 3-12: gastritis   Hay fever    Head injury 11/2017   with fall   Hyperlipemia    Hypertension    Hypothyroidism    Hypothyroid   Insomnia    Osteopenia     per DEXA 07-2008 (Rx fosamax)   Rheumatoid arthritis ([place])    Stroke ([place])     " balance issue"   Past Surgical History:  Procedure Laterality Date   COLON SURGERY     Right / Adenocarcinoma / Chemo   COLONOSCOPY  2005, 2008   Dr. Lucia Gaskins   INGUINAL HERNIA REPAIR     Right   IR ANGIO INTRA EXTRACRAN SEL COM CAROTID INNOMINATE BILAT MOD SED  12/13/2017   IR ANGIO VERTEBRAL SEL VERTEBRAL BILAT MOD SED  12/13/2017   IR PTA INTRACRANIAL  12/18/2017   RADIOLOGY WITH ANESTHESIA N/A 12/18/2017   Procedure: Angioplasty with stenting;  Surgeon: Luanne Bras, MD;  Location: [facility];  Service: Radiology;  Laterality: N/A;   Patient Active Problem List   Diagnosis Date Noted   Requires assistance with activities of daily living (ADL) 10/11/2021   Bronchitis 10/11/2021    Trapezium fracture 09/09/2021   CMC arthritis, thumb, degenerative 09/09/2021   Left wrist pain 09/05/2021   Radial nerve palsy, left 09/05/2021   Acute shoulder pain due to trauma, left 07/29/2021   Slow transit constipation    Stroke (cerebrum) ([place]) 07/07/2021   CVA (cerebral vascular accident) [facility]) with intracranial vascular stenosis 06/29/2021   Intracranial vascular stenosis 06/29/2021   Prediabetes 06/29/2021   Normocytic anemia 06/29/2021   History of stroke 06/29/2021   Other drug-induced neutropenia ([place]) 06/09/2021   Malignant neoplasm of ascending colon ([place]) 06/09/2021   Stenosis of artery ([place]) 06/09/2021   Preventative health care 03/01/2021   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   Kidney disease, chronic, stage III (moderate, EGFR 30-59 ml/min) ([place]) 08/07/2020   Leukopenia 08/07/2020   Acute respiratory failure with hypoxia ([place]) 08/07/2020   SIRS (systemic inflammatory response syndrome) ([place]) 08/07/2020   Methotrexate adverse reaction, initial encounter    Acute pain of right shoulder 04/14/2020   Lower extremity edema 11/13/2019   Head trauma 11/13/2019   Dyslipidemia 11/13/2019   Memory loss 07/28/2019   Chronic right shoulder pain 07/28/2019   Frequent falls 07/28/2019   Balance problem 07/28/2019  Dysphagia 07/28/2019   Traumatic hemorrhage of cerebrum, unspecified, without loss of consciousness, initial encounter [facility])    Basilar artery stenosis with infarction ([place]) 12/18/2017   TIA (transient ischemic attack) 12/10/2017   Rheumatoid arthritis ([place])    Chest pain 06/02/2011   General medical examination 04/11/2011   PEPTIC ULCER DISEASE, HELICOBACTER PYLORI POSITIVE 07/12/2010   PERSONAL HISTORY MALIG NEOPLASM LARGE INTESTINE 05/18/2010   MITRAL VALVE DISORDERS 04/05/2009   HOARSENESS 12/04/2008   OSTEOPENIA 08/26/2008   HYPERTRIGLYCERIDEMIA 07/23/2008   EPISTAXIS 07/26/2007   ERECTILE DYSFUNCTION 02/22/2007   ECZEMA 12/07/2006   LEG CRAMPS  10/09/2006   Hypothyroidism 08/28/2006   Essential hypertension 08/28/2006   HAY FEVER 08/28/2006   INSOMNIA 08/28/2006    ONSET DATE: 07/07/2021  REFERRING DIAG: D82.64  THERAPY DIAG:  Cerebrovascular accident (CVA) due to thrombosis of cerebral artery ([place])  Other abnormalities of gait and mobility  Muscle weakness (generalized)  Unspecified lack of coordination  Rationale for Evaluation and Treatment Rehabilitation  SUBJECTIVE:                                                                                                                                                                                              SUBJECTIVE STATEMENT:  No falls, reports that he had an appointment today and he is tired  Pt accompanied by: interpreter  PERTINENT HISTORY: colon surgery, head inury 11/2017, requires assistance w/ADLs, bronchitis 10/11/21, L radial nerve palsy 08/2021, hx of stroke and CVA, stage 3 kidney disease, memory loss, frequent falls (the most recent being 08/27/21 and he hurt his shoulder)  PAIN:  Are you having pain? No  PRECAUTIONS: Fall  WEIGHT BEARING RESTRICTIONS No  FALLS: Has patient fallen in last 6 months? Yes. 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, historic buildings and History of cognitive impairments - at baseline Hx of intermittent confusion, unable to answer questions with full understanding   LOWER EXTREMITY ROM:   no objective measurements taken, but noticeable hip tightness with  gait, and IR/ER tightness when tested in wheelchair  Active  Right Eval Left Eval  Hip flexion    Hip extension    Hip abduction    Hip  adduction    Hip internal rotation    Hip external rotation    Knee flexion    Knee extension    Ankle dorsiflexion    Ankle plantarflexion    Ankle inversion    Ankle eversion     (Blank rows = not tested)  LOWER EXTREMITY MMT:    MMT Right Eval Left Eval  Hip flexion 4- 4-  Hip extension    Hip abduction 3+ 3+  Hip adduction 4 4  Hip internal rotation    Hip external rotation    Knee flexion 4- 4-  Knee extension 4- 4-  Ankle dorsiflexion 4 4  Ankle plantarflexion 4 4  Ankle inversion    Ankle eversion    (Blank rows = not tested)  UPPER EXTREMITY ROM:     Active  Right Eval Left Eval  Shoulder flexion 125 110  Shoulder extension    Shoulder abduction 73 90  Shoulder adduction    Shoulder IR    Shoulder ER [facility] WFL  Elbow flexion WNL WNL  Elbow extension WNL WNL   (Blank rows = not tested)  UPPER EXTREMITY MMT:     MMT Right Eval Left Eval  Shoulder flexion 3 3  Shoulder extension    Shoulder abduction 2+ 2+  Shoulder adduction    Shoulder IR 3+ 3+  Shoulder ER 3+ 3+  Elbow flexion 4- 4-  Elbow extension 4- 4-  (Blank rows = not tested)  BED MOBILITY:  TBD  TRANSFERS: Assistive device utilized: Environmental consultant - 2 wheeled and Wheelchair (manual)  Sit to stand: Mod A Stand to sit: Mod A Patient has a tendency to push backwards with transfers  GAIT: Gait pattern: step to pattern, decreased step length- Right, decreased step length- Left, decreased stride length, shuffling, ataxic, trendelenburg, decreased trunk rotation, trunk flexed, and wide BOS Distance walked: 30f Assistive device utilized: Walker - 2 wheeled Level of assistance: Mod A Comments: modA to stand from w/c and walk 135f unsteadiness on feet especially with turns, slow gait, wide BOS  FUNCTIONAL TESTs:  5 times sit to stand: TBD (unable to  do w/o modA) Timed up and go (TUG): 39m20m30sec w/modA-maxA   TODAY'S TREATMENT:  02/14/22 Nustep Level 5 x 6 minutes Leg ext 5# 3x10 Leg curls 15# 3x10 Side stepping, high knees and hip abduction in the Pbars Standing ball toss Sit to stand Standing reaching Seated volley ball Gait with HHA and then with FWW x150 ' each  02/08/22 NuStep x 6 minutes, L5, including 30 sec fast and 30 sec against increased resistance, U and LE. Practiced forward weight shifts, then sit to stand x 5 minutes with education for position of head, chest, and hands and feet. Initially min a, progressed to CG-S. Postural training and balance training, including step back with rotation and B side lunges, trying to speed up movements. Standing shoulder horizontal abd with yellow Tband and diagonals for balance and posture Ambulated x 60' while holding 1# dumbell in BUE, no AD, CGA, VC for posture and step length.  01/25/22 NuStep U and LE, L3 x 5 minutes Seated trunk stability, had difficulty understanding opposite arm and leg raise Seated reach up and reach into diagonals with 1# ball in BUE Seated forward weight shifts with hands on RW, push it forward x 3. Last one, moved sit to stand Sit to stand x 10 reps. Standing reach up and diagonals with 1# ball, 5 each. Standing reach up and back with trunk  rotation, 5 to each side. *Therapist facilitated upright posture and active trunk with each exercise. Ambulation with shopping cart x 70', CGA.   01/16/22 Nustep level 4 x 6 minutes Leg extension 5# 3x10 Leg curls 15# 3x10 Seated red tban rows and extensions 3x10 [place] HHA x 120 feet, with FWW 120' x 2, then with the FWW x 180 feet to the car In FWW, cone toe touches   12/27/21   Walking HHA 4x100'   Side stepping HHA    NuStep Level 4 x 5 minutes   Marches 2.5#   Hip abduction 2.5#   LAQ 2.5#   Red tband HS curls   Sit to stand HHA x6   PATIENT EDUCATION: Education details: POC  Person educated:  Patient and Armed forces training and education officer method: Explanation, Media planner, Corporate treasurer cues, and Verbal cues Education comprehension: returned demonstration, verbal cues required, tactile cues required, and needs further education   HOME EXERCISE PROGRAM: Access Code: [email or web address]    GOALS: Goals reviewed with patient? Yes  SHORT TERM GOALS: Target date: 11/30/21  Patient will complete HEP Baseline: Goal status: met    LONG TERM GOALS: Target date: 02/16/22  Patient will demonstrate 4/5 strength in UE to be able to complete ADLs with minA Baseline:  Goal status: on going  2.  Patient will ambulate 19f with minA or better to be able to navigate inside home Baseline:  Goal status: on going  3.  Patient will demonstrate >= 4/5 strength in LE to be able to complete transfers with less assistance and become household ambulator Baseline:  Goal status: ongoing  4.  Patient will score <186m on TUG using rolling walker and minA Baseline:  Goal status:on going   ASSESSMENT:  CLINICAL IMPRESSION:  Patient with more problems leaning back today needed a lot of cues to come forward, a lot of nose over toes cues to stand and then at times still really pushing back.  Daughter and interpreter think that it could be the appointments that he had this AM and he is tired.  He did c/o leg and knee fatigue  OBJECTIVE IMPAIRMENTS Abnormal gait, decreased activity tolerance, decreased balance, decreased cognition, decreased coordination, decreased knowledge of condition, decreased mobility, difficulty walking, decreased ROM, decreased strength, and decreased safety awareness.   ACTIVITY LIMITATIONS sitting, standing, transfers, bed mobility, bathing, toileting, dressing, and locomotion level  PARTICIPATION LIMITATIONS: cleaning, interpersonal relationship, community activity, and home maintenance  PERSONAL FACTORS Age, Behavior pattern, and 3+ comorbidities: frequent falls, hx of strokes, memory loss   are also affecting patient's functional outcome.   REHAB POTENTIAL: Fair age, low functional level, and cognitive factors may cause difficulty with treatment  CLINICAL DECISION MAKING: Evolving/moderate complexity  EVALUATION COMPLEXITY: Moderate  PLAN: PT FREQUENCY: 2x/week  PT DURATION: 12 weeks  PLANNED INTERVENTIONS: Therapeutic exercises, Therapeutic activity, Neuromuscular re-education, Balance training, Gait training, Patient/Family education, Joint mobilization, Stair training, Wheelchair mobility training, and Re-evaluation  PLAN FOR NEXT SESSION: Print updated Hep and provide to family.  MiLum BabePT 02/14/22 1:09 PM

## 2022-02-18 DIAGNOSIS — I10 Essential (primary) hypertension: Secondary | ICD-10-CM | POA: Diagnosis not present

## 2022-02-18 DIAGNOSIS — M8580 Other specified disorders of bone density and structure, unspecified site: Secondary | ICD-10-CM | POA: Diagnosis not present

## 2022-02-18 DIAGNOSIS — E039 Hypothyroidism, unspecified: Secondary | ICD-10-CM

## 2022-02-18 DIAGNOSIS — E782 Mixed hyperlipidemia: Secondary | ICD-10-CM

## 2022-02-18 DIAGNOSIS — F0390 Unspecified dementia without behavioral disturbance: Secondary | ICD-10-CM

## 2022-02-18 LAB — QUANTIFERON-TB GOLD PLUS
Mitogen-NIL: >10 IU/mL
NIL: 0.05 IU/mL
QuantiFERON-TB Gold Plus: NEGATIVE
TB1-NIL: 0.07 IU/mL
TB2-NIL: 0.05 IU/mL

## 2022-02-20 ENCOUNTER — Other Ambulatory Visit: Payer: Self-pay | Admitting: Family Medicine

## 2022-02-20 ENCOUNTER — Encounter: Payer: Self-pay | Admitting: Physical Therapy

## 2022-02-20 ENCOUNTER — Ambulatory Visit: Payer: Medicare Other | Attending: Family Medicine | Admitting: Physical Therapy

## 2022-02-20 DIAGNOSIS — R296 Repeated falls: Secondary | ICD-10-CM | POA: Insufficient documentation

## 2022-02-20 DIAGNOSIS — M6281 Muscle weakness (generalized): Secondary | ICD-10-CM | POA: Insufficient documentation

## 2022-02-20 DIAGNOSIS — Z8673 Personal history of transient ischemic attack (TIA), and cerebral infarction without residual deficits: Secondary | ICD-10-CM | POA: Diagnosis not present

## 2022-02-20 DIAGNOSIS — I6330 Cerebral infarction due to thrombosis of unspecified cerebral artery: Secondary | ICD-10-CM | POA: Diagnosis not present

## 2022-02-20 DIAGNOSIS — R2689 Other abnormalities of gait and mobility: Secondary | ICD-10-CM | POA: Insufficient documentation

## 2022-02-20 NOTE — Therapy (Signed)
OUTPATIENT PHYSICAL THERAPY TREATMENT    Patient Name: [name] MRN: [id number] DOB:[birth year], 88 y.o., male Today's Date: 02/20/2022   PCP: [name] REFERRING PROVIDER: [name]   PT End of Session - 02/20/22 1348     Visit Number 14    Date for PT Re-Evaluation 03/23/22    Authorization Type [employer]    PT Start Time 0100    PT Stop Time 1430    PT Time Calculation (min) 45 min    Equipment Utilized During Treatment Gait belt    Activity Tolerance Patient tolerated treatment well    Behavior During Therapy WFL for tasks assessed/performed                    Past Medical History:  Diagnosis Date   Adenocarcinoma ([place]) 1985   colon s/p right hemicolectomy and chemotherapy. F/U with cancer center and Dr. Lucia Gaskins rotinely   Depression    Eczema    Epistaxis    f/u per ENT   Erectile dysfunction    Gastric ulcer 12/11   H-Pylori Tx, EGD 3-12: gastritis   Hay fever    Head injury 11/2017   with fall   Hyperlipemia    Hypertension    Hypothyroidism    Hypothyroid   Insomnia    Osteopenia     per DEXA 07-2008 (Rx fosamax)   Rheumatoid arthritis ([place])    Stroke ([place])     " balance issue"   Past Surgical History:  Procedure Laterality Date   COLON SURGERY     Right / Adenocarcinoma / Chemo   COLONOSCOPY  2005, 2008   Dr. Lucia Gaskins   INGUINAL HERNIA REPAIR     Right   IR ANGIO INTRA EXTRACRAN SEL COM CAROTID INNOMINATE BILAT MOD SED  12/13/2017   IR ANGIO VERTEBRAL SEL VERTEBRAL BILAT MOD SED  12/13/2017   IR PTA INTRACRANIAL  12/18/2017   RADIOLOGY WITH ANESTHESIA N/A 12/18/2017   Procedure: Angioplasty with stenting;  Surgeon: Luanne Bras, MD;  Location: [place];  Service: Radiology;  Laterality: N/A;   Patient Active Problem List   Diagnosis Date Noted   Requires assistance with activities of daily living (ADL) 10/11/2021   Bronchitis 10/11/2021   Trapezium fracture 09/09/2021   CMC arthritis, thumb, degenerative 09/09/2021    Left wrist pain 09/05/2021   Radial nerve palsy, left 09/05/2021   Acute shoulder pain due to trauma, left 07/29/2021   Slow transit constipation    Stroke (cerebrum) ([place]) 07/07/2021   CVA (cerebral vascular accident) [facility]) with intracranial vascular stenosis 06/29/2021   Intracranial vascular stenosis 06/29/2021   Prediabetes 06/29/2021   Normocytic anemia 06/29/2021   History of stroke 06/29/2021   Other drug-induced neutropenia ([place]) 06/09/2021   Malignant neoplasm of ascending colon ([place]) 06/09/2021   Stenosis of artery ([place]) 06/09/2021   Preventative health care 03/01/2021   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   Kidney disease, chronic, stage III (moderate, EGFR 30-59 ml/min) ([place]) 08/07/2020   Leukopenia 08/07/2020   Acute respiratory failure with hypoxia ([place]) 08/07/2020   SIRS (systemic inflammatory response syndrome) ([place]) 08/07/2020   Methotrexate adverse reaction, initial encounter    Acute pain of right shoulder 04/14/2020   Lower extremity edema 11/13/2019   Head trauma 11/13/2019   Dyslipidemia 11/13/2019   Memory loss 07/28/2019   Chronic right shoulder pain 07/28/2019   Frequent falls 07/28/2019   Balance problem 07/28/2019   Dysphagia 07/28/2019   Traumatic hemorrhage of cerebrum, unspecified, without  loss of consciousness, initial encounter [facility])    Basilar artery stenosis with infarction ([place]) 12/18/2017   TIA (transient ischemic attack) 12/10/2017   Rheumatoid arthritis ([place])    Chest pain 06/02/2011   General medical examination 04/11/2011   PEPTIC ULCER DISEASE, HELICOBACTER PYLORI POSITIVE 07/12/2010   PERSONAL HISTORY MALIG NEOPLASM LARGE INTESTINE 05/18/2010   MITRAL VALVE DISORDERS 04/05/2009   HOARSENESS 12/04/2008   OSTEOPENIA 08/26/2008   HYPERTRIGLYCERIDEMIA 07/23/2008   EPISTAXIS 07/26/2007   ERECTILE DYSFUNCTION 02/22/2007   ECZEMA 12/07/2006   LEG CRAMPS 10/09/2006   Hypothyroidism 08/28/2006   Essential hypertension 08/28/2006   HAY FEVER  08/28/2006   INSOMNIA 08/28/2006    ONSET DATE: 07/07/2021  REFERRING DIAG: I95.18  THERAPY DIAG:  No diagnosis found.  Rationale for Evaluation and Treatment Rehabilitation  SUBJECTIVE:                                                                                                                                                                                              SUBJECTIVE STATEMENT:  No falls, had a good weekend  Pt accompanied by: interpreter  PERTINENT HISTORY: colon surgery, head inury 11/2017, requires assistance w/ADLs, bronchitis 10/11/21, L radial nerve palsy 08/2021, hx of stroke and CVA, stage 3 kidney disease, memory loss, frequent falls (the most recent being 08/27/21 and he hurt his shoulder)  PAIN:  Are you having pain? No  PRECAUTIONS: Fall  WEIGHT BEARING RESTRICTIONS No  FALLS: Has patient fallen in last 6 months? Yes. 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, historic buildings and History of cognitive impairments - at baseline Hx of intermittent confusion, unable to answer questions with full understanding   LOWER EXTREMITY ROM:   no objective measurements taken, but noticeable hip tightness with gait, and IR/ER tightness when tested in wheelchair  Active  Right Eval Left Eval  Hip flexion    Hip extension    Hip abduction    Hip adduction    Hip internal rotation    Hip external rotation    Knee flexion    Knee extension    Ankle dorsiflexion    Ankle plantarflexion     Ankle inversion  Ankle eversion     (Blank rows = not tested)  LOWER EXTREMITY MMT:    MMT Right Eval Left Eval  Hip flexion 4- 4-  Hip extension    Hip abduction 3+ 3+  Hip adduction 4 4  Hip internal rotation    Hip external rotation    Knee flexion 4- 4-  Knee extension 4- 4-  Ankle dorsiflexion 4 4  Ankle plantarflexion 4 4  Ankle inversion    Ankle eversion    (Blank rows = not tested)  UPPER EXTREMITY ROM:     Active  Right Eval Left Eval  Shoulder flexion 125 110  Shoulder extension    Shoulder abduction 73 90  Shoulder adduction    Shoulder IR    Shoulder ER [facility] WFL  Elbow flexion WNL WNL  Elbow extension WNL WNL   (Blank rows = not tested)  UPPER EXTREMITY MMT:     MMT Right Eval Left Eval  Shoulder flexion 3 3  Shoulder extension    Shoulder abduction 2+ 2+  Shoulder adduction    Shoulder IR 3+ 3+  Shoulder ER 3+ 3+  Elbow flexion 4- 4-  Elbow extension 4- 4-  (Blank rows = not tested)  BED MOBILITY:  TBD  TRANSFERS: Assistive device utilized: Environmental consultant - 2 wheeled and Wheelchair (manual)  Sit to stand: Mod A Stand to sit: Mod A Patient has a tendency to push backwards with transfers  GAIT: Gait pattern: step to pattern, decreased step length- Right, decreased step length- Left, decreased stride length, shuffling, ataxic, trendelenburg, decreased trunk rotation, trunk flexed, and wide BOS Distance walked: 3f Assistive device utilized: Walker - 2 wheeled Level of assistance: Mod A Comments: modA to stand from w/c and walk 170f unsteadiness on feet especially with turns, slow gait, wide BOS  FUNCTIONAL TESTs:  5 times sit to stand: TBD (unable to do w/o modA) Timed up and go (TUG): 85m32m30sec w/modA-maxA   TODAY'S TREATMENT:  02/20/22 Standing in Pbars 2.5# marches, hip abduction and extension 2x10 Nu step L5 x 6 minutes Leg ext 5# 3x10 Leg curls 15# 3x10 Gait with FWW 220 feet with CGA, then 120 feet Standing ball toss with  CGA, then him throwing ball into a box  02/14/22 Nustep Level 5 x 6 minutes Leg ext 5# 3x10 Leg curls 15# 3x10 Side stepping, high knees and hip abduction in the Pbars Standing ball toss Sit to stand Standing reaching Seated volley ball Gait with HHA and then with FWW x150 ' each  02/08/22 NuStep x 6 minutes, L5, including 30 sec fast and 30 sec against increased resistance, U and LE. Practiced forward weight shifts, then sit to stand x 5 minutes with education for position of head, chest, and hands and feet. Initially min a, progressed to CG-S. Postural training and balance training, including step back with rotation and B side lunges, trying to speed up movements. Standing shoulder horizontal abd with yellow Tband and diagonals for balance and posture Ambulated x 60' while holding 1# dumbell in BUE, no AD, CGA, VC for posture and step length.  01/25/22 NuStep U and LE, L3 x 5 minutes Seated trunk stability, had difficulty understanding opposite arm and leg raise Seated reach up and reach into diagonals with 1# ball in BUE Seated forward weight shifts with hands on RW, push it forward x 3. Last one, moved sit to stand Sit to stand x 10 reps. Standing reach up and diagonals with 1# ball, 5 each.  Standing reach up and back with trunk rotation, 5 to each side. *Therapist facilitated upright posture and active trunk with each exercise. Ambulation with shopping cart x 70', CGA.   01/16/22 Nustep level 4 x 6 minutes Leg extension 5# 3x10 Leg curls 15# 3x10 Seated red tban rows and extensions 3x10 [place] HHA x 120 feet, with FWW 120' x 2, then with the FWW x 180 feet to the car In FWW, cone toe touches   12/27/21   Walking HHA 4x100'   Side stepping HHA    NuStep Level 4 x 5 minutes   Marches 2.5#   Hip abduction 2.5#   LAQ 2.5#   Red tband HS curls   Sit to stand HHA x6   PATIENT EDUCATION: Education details: POC  Person educated: Patient and Armed forces training and education officer method:  Explanation, Media planner, Corporate treasurer cues, and Verbal cues Education comprehension: returned demonstration, verbal cues required, tactile cues required, and needs further education   HOME EXERCISE PROGRAM: Access Code: [email or web address]    GOALS: Goals reviewed with patient? Yes  SHORT TERM GOALS: Target date: 11/30/21  Patient will complete HEP Baseline: Goal status: met    LONG TERM GOALS: Target date: 02/16/22  Patient will demonstrate 4/5 strength in UE to be able to complete ADLs with minA Baseline:  Goal status: on going  2.  Patient will ambulate 75f with minA or better to be able to navigate inside home Baseline:  Goal status: on going  3.  Patient will demonstrate >= 4/5 strength in LE to be able to complete transfers with less assistance and become household ambulator Baseline:  Goal status: ongoing  4.  Patient will score <123m on TUG using rolling walker and minA Baseline:  Goal status:on going   ASSESSMENT:  CLINICAL IMPRESSION:  Patient did well today he does have some distraction at times when the gym is busy, he seems to like the ball toss and catch and can stand on his own truly with CGA/S  He seems to be able to be more consistent and he does work hard.  He has times when he does push back more often and needs cues to lean forward and go up and not back  OBJECTIVE IMPAIRMENTS Abnormal gait, decreased activity tolerance, decreased balance, decreased cognition, decreased coordination, decreased knowledge of condition, decreased mobility, difficulty walking, decreased ROM, decreased strength, and decreased safety awareness.   ACTIVITY LIMITATIONS sitting, standing, transfers, bed mobility, bathing, toileting, dressing, and locomotion level  PARTICIPATION LIMITATIONS: cleaning, interpersonal relationship, community activity, and home maintenance  PERSONAL FACTORS Age, Behavior pattern, and 3+ comorbidities: frequent falls, hx of strokes, memory loss  are also  affecting patient's functional outcome.   REHAB POTENTIAL: Fair age, low functional level, and cognitive factors may cause difficulty with treatment  CLINICAL DECISION MAKING: Evolving/moderate complexity  EVALUATION COMPLEXITY: Moderate  PLAN: PT FREQUENCY: 2x/week  PT DURATION: 12 weeks  PLANNED INTERVENTIONS: Therapeutic exercises, Therapeutic activity, Neuromuscular re-education, Balance training, Gait training, Patient/Family education, Joint mobilization, Stair training, Wheelchair mobility training, and Re-evaluation  PLAN FOR NEXT SESSION: continue to progress function and balance MiLum BabePT 02/20/22 1:49 PM

## 2022-02-22 ENCOUNTER — Telehealth: Payer: Self-pay | Admitting: Family Medicine

## 2022-02-22 NOTE — Telephone Encounter (Signed)
Forms completed and given to Jarrett Soho to email after nurse dropped off duplicate paperwork

## 2022-02-22 NOTE — Telephone Encounter (Signed)
Patient's daughter called stating the [employer] will be sending FL2 forms to [place].

## 2022-03-01 ENCOUNTER — Ambulatory Visit: Payer: Medicare Other | Admitting: Physical Therapy

## 2022-03-01 ENCOUNTER — Encounter: Payer: Self-pay | Admitting: Physical Therapy

## 2022-03-01 DIAGNOSIS — R2689 Other abnormalities of gait and mobility: Secondary | ICD-10-CM

## 2022-03-01 DIAGNOSIS — Z8673 Personal history of transient ischemic attack (TIA), and cerebral infarction without residual deficits: Secondary | ICD-10-CM | POA: Diagnosis not present

## 2022-03-01 DIAGNOSIS — R296 Repeated falls: Secondary | ICD-10-CM | POA: Diagnosis not present

## 2022-03-01 DIAGNOSIS — M6281 Muscle weakness (generalized): Secondary | ICD-10-CM

## 2022-03-01 DIAGNOSIS — I6330 Cerebral infarction due to thrombosis of unspecified cerebral artery: Secondary | ICD-10-CM

## 2022-03-01 NOTE — Therapy (Signed)
OUTPATIENT PHYSICAL THERAPY TREATMENT    Patient Name: [name] MRN: [id number] DOB:27-Oct-1928, 88 y.o., male Today's Date: 03/01/2022   PCP: [name] REFERRING PROVIDER: [name]   PT End of Session - 03/01/22 1102     Visit Number 15    Date for PT Re-Evaluation 03/23/22    Authorization Type [employer]    PT Start Time 2244    PT Stop Time 1142    PT Time Calculation (min) 44 min    Equipment Utilized During Treatment Gait belt    Activity Tolerance Patient tolerated treatment well    Behavior During Therapy WFL for tasks assessed/performed                    Past Medical History:  Diagnosis Date   Adenocarcinoma ([place]) 1985   colon s/p right hemicolectomy and chemotherapy. F/U with cancer center and Dr. Lucia Gaskins rotinely   Depression    Eczema    Epistaxis    f/u per ENT   Erectile dysfunction    Gastric ulcer 12/11   H-Pylori Tx, EGD 3-12: gastritis   Hay fever    Head injury 11/2017   with fall   Hyperlipemia    Hypertension    Hypothyroidism    Hypothyroid   Insomnia    Osteopenia     per DEXA 07-2008 (Rx fosamax)   Rheumatoid arthritis ([place])    Stroke ([place])     " balance issue"   Past Surgical History:  Procedure Laterality Date   COLON SURGERY     Right / Adenocarcinoma / Chemo   COLONOSCOPY  2005, 2008   Dr. Lucia Gaskins   INGUINAL HERNIA REPAIR     Right   IR ANGIO INTRA EXTRACRAN SEL COM CAROTID INNOMINATE BILAT MOD SED  12/13/2017   IR ANGIO VERTEBRAL SEL VERTEBRAL BILAT MOD SED  12/13/2017   IR PTA INTRACRANIAL  12/18/2017   RADIOLOGY WITH ANESTHESIA N/A 12/18/2017   Procedure: Angioplasty with stenting;  Surgeon: Luanne Bras, MD;  Location: [facility];  Service: Radiology;  Laterality: N/A;   Patient Active Problem List   Diagnosis Date Noted   Requires assistance with activities of daily living (ADL) 10/11/2021   Bronchitis 10/11/2021   Trapezium fracture 09/09/2021   CMC arthritis, thumb, degenerative 09/09/2021    Left wrist pain 09/05/2021   Radial nerve palsy, left 09/05/2021   Acute shoulder pain due to trauma, left 07/29/2021   Slow transit constipation    Stroke (cerebrum) ([place]) 07/07/2021   CVA (cerebral vascular accident) [facility]) with intracranial vascular stenosis 06/29/2021   Intracranial vascular stenosis 06/29/2021   Prediabetes 06/29/2021   Normocytic anemia 06/29/2021   History of stroke 06/29/2021   Other drug-induced neutropenia ([place]) 06/09/2021   Malignant neoplasm of ascending colon ([place]) 06/09/2021   Stenosis of artery ([place]) 06/09/2021   Preventative health care 03/01/2021   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   Kidney disease, chronic, stage III (moderate, EGFR 30-59 ml/min) ([place]) 08/07/2020   Leukopenia 08/07/2020   Acute respiratory failure with hypoxia ([place]) 08/07/2020   SIRS (systemic inflammatory response syndrome) ([place]) 08/07/2020   Methotrexate adverse reaction, initial encounter    Acute pain of right shoulder 04/14/2020   Lower extremity edema 11/13/2019   Head trauma 11/13/2019   Dyslipidemia 11/13/2019   Memory loss 07/28/2019   Chronic right shoulder pain 07/28/2019   Frequent falls 07/28/2019   Balance problem 07/28/2019   Dysphagia 07/28/2019   Traumatic hemorrhage of cerebrum, unspecified, without  loss of consciousness, initial encounter [facility])    Basilar artery stenosis with infarction ([place]) 12/18/2017   TIA (transient ischemic attack) 12/10/2017   Rheumatoid arthritis ([place])    Chest pain 06/02/2011   General medical examination 04/11/2011   PEPTIC ULCER DISEASE, HELICOBACTER PYLORI POSITIVE 07/12/2010   PERSONAL HISTORY MALIG NEOPLASM LARGE INTESTINE 05/18/2010   MITRAL VALVE DISORDERS 04/05/2009   HOARSENESS 12/04/2008   OSTEOPENIA 08/26/2008   HYPERTRIGLYCERIDEMIA 07/23/2008   EPISTAXIS 07/26/2007   ERECTILE DYSFUNCTION 02/22/2007   ECZEMA 12/07/2006   LEG CRAMPS 10/09/2006   Hypothyroidism 08/28/2006   Essential hypertension 08/28/2006   HAY  FEVER 08/28/2006   INSOMNIA 08/28/2006    ONSET DATE: 07/07/2021  REFERRING DIAG: Z61.09  THERAPY DIAG:  Balance problem  Frequent falls  History of stroke  Cerebrovascular accident (CVA) due to thrombosis of cerebral artery ([place])  Other abnormalities of gait and mobility  Muscle weakness (generalized)  Rationale for Evaluation and Treatment Rehabilitation  SUBJECTIVE:                                                                                                                                                                                              SUBJECTIVE STATEMENT:  New caregiver, no information given  Pt accompanied by: interpreter  PERTINENT HISTORY: colon surgery, head inury 11/2017, requires assistance w/ADLs, bronchitis 10/11/21, L radial nerve palsy 08/2021, hx of stroke and CVA, stage 3 kidney disease, memory loss, frequent falls (the most recent being 08/27/21 and he hurt his shoulder)  PAIN:  Are you having pain? No  PRECAUTIONS: Fall  WEIGHT BEARING RESTRICTIONS No  FALLS: Has patient fallen in last 6 months? Yes. 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, historic buildings and History of cognitive impairments - at baseline Hx of intermittent confusion, unable to answer questions with full understanding   LOWER EXTREMITY ROM:   no objective measurements taken, but noticeable hip tightness with gait, and IR/ER tightness when tested in wheelchair  Active  Right Eval Left Eval  Hip  flexion    Hip extension    Hip abduction    Hip adduction    Hip internal rotation    Hip external rotation  Knee flexion    Knee extension    Ankle dorsiflexion    Ankle plantarflexion    Ankle inversion    Ankle eversion     (Blank rows = not tested)  LOWER EXTREMITY MMT:    MMT Right Eval Left Eval  Hip flexion 4- 4-  Hip extension    Hip abduction 3+ 3+  Hip adduction 4 4  Hip internal rotation    Hip external rotation    Knee flexion 4- 4-  Knee extension 4- 4-  Ankle dorsiflexion 4 4  Ankle plantarflexion 4 4  Ankle inversion    Ankle eversion    (Blank rows = not tested)  UPPER EXTREMITY ROM:     Active  Right Eval Left Eval  Shoulder flexion 125 110  Shoulder extension    Shoulder abduction 73 90  Shoulder adduction    Shoulder IR    Shoulder ER [facility] WFL  Elbow flexion WNL WNL  Elbow extension WNL WNL   (Blank rows = not tested)  UPPER EXTREMITY MMT:     MMT Right Eval Left Eval  Shoulder flexion 3 3  Shoulder extension    Shoulder abduction 2+ 2+  Shoulder adduction    Shoulder IR 3+ 3+  Shoulder ER 3+ 3+  Elbow flexion 4- 4-  Elbow extension 4- 4-  (Blank rows = not tested)  BED MOBILITY:  TBD  TRANSFERS: Assistive device utilized: Environmental consultant - 2 wheeled and Wheelchair (manual)  Sit to stand: Mod A Stand to sit: Mod A Patient has a tendency to push backwards with transfers  GAIT: Gait pattern: step to pattern, decreased step length- Right, decreased step length- Left, decreased stride length, shuffling, ataxic, trendelenburg, decreased trunk rotation, trunk flexed, and wide BOS Distance walked: 27f Assistive device utilized: Walker - 2 wheeled Level of assistance: Mod A Comments: modA to stand from w/c and walk 165f unsteadiness on feet especially with turns, slow gait, wide BOS  FUNCTIONAL TESTs:  5 times sit to stand: TBD (unable to do w/o modA) Timed up and go (TUG): 52m47m30sec w/modA-maxA   TODAY'S TREATMENT:   03/01/22 Nustep level 5 x 6 minutes Leg curls 15# 2x10 Leg extension 5# Standing red tband rows and extension 2.5# marching and hip abduction in Pbars Standing ball toss Standing toss of 1# and 1.5# cuff weights into box Walking with FWW, CGA and some assist with the navigation 200' x 2   02/20/22 Standing in Pbars 2.5# marches, hip abduction and extension 2x10 Nu step L5 x 6 minutes Leg ext 5# 3x10 Leg curls 15# 3x10 Gait with FWW 220 feet with CGA, then 120 feet Standing ball toss with CGA, then him throwing ball into a box  02/14/22 Nustep Level 5 x 6 minutes Leg ext 5# 3x10 Leg curls 15# 3x10 Side stepping, high knees and hip abduction in the Pbars Standing ball toss Sit to stand Standing reaching Seated volley ball Gait with HHA and then with FWW x150 ' each  02/08/22 NuStep x 6 minutes, L5, including 30 sec fast and 30 sec against increased resistance, U and LE. Practiced forward weight shifts, then sit to stand x 5 minutes with education for position of head, chest, and hands and feet. Initially min a, progressed to CG-S. Postural training and balance training, including step back with rotation and B side lunges, trying to speed up movements. Standing shoulder horizontal abd with yellow Tband and diagonals for balance and posture Ambulated x 60' while holding 1#  dumbell in BUE, no AD, CGA, VC for posture and step length.  01/25/22 NuStep U and LE, L3 x 5 minutes Seated trunk stability, had difficulty understanding opposite arm and leg raise Seated reach up and reach into diagonals with 1# ball in BUE Seated forward weight shifts with hands on RW, push it forward x 3. Last one, moved sit to stand Sit to stand x 10 reps. Standing reach up and diagonals with 1# ball, 5 each. Standing reach up and back with trunk rotation, 5 to each side. *Therapist facilitated upright posture and active trunk with each exercise. Ambulation with shopping cart x 70',  CGA.   01/16/22 Nustep level 4 x 6 minutes Leg extension 5# 3x10 Leg curls 15# 3x10 Seated red tban rows and extensions 3x10 [place] HHA x 120 feet, with FWW 120' x 2, then with the FWW x 180 feet to the car In FWW, cone toe touches   12/27/21   Walking HHA 4x100'   Side stepping HHA    NuStep Level 4 x 5 minutes   Marches 2.5#   Hip abduction 2.5#   LAQ 2.5#   Red tband HS curls   Sit to stand HHA x6   PATIENT EDUCATION: Education details: POC  Person educated: Patient and Armed forces training and education officer method: Explanation, Media planner, Corporate treasurer cues, and Verbal cues Education comprehension: returned demonstration, verbal cues required, tactile cues required, and needs further education   HOME EXERCISE PROGRAM: Access Code: [email or web address]    GOALS: Goals reviewed with patient? Yes  SHORT TERM GOALS: Target date: 11/30/21  Patient will complete HEP Baseline: Goal status: met    LONG TERM GOALS: Target date: 02/16/22  Patient will demonstrate 4/5 strength in UE to be able to complete ADLs with minA Baseline:  Goal status: on going  2.  Patient will ambulate 13f with minA or better to be able to navigate inside home Baseline:  Goal status:partially met  3.  Patient will demonstrate >= 4/5 strength in LE to be able to complete transfers with less assistance and become household ambulator Baseline:  Goal status: ongoing  4.  Patient will score <180m on TUG using rolling walker and minA Baseline:  Goal status:partially met   ASSESSMENT:  CLINICAL IMPRESSION:  Patient performed well today, less distracted, does well with the ball toss and he is standing with supervision only once he gets up, he is still having some difficulty standing from sitting as he tends to push back.  He does fatigue toward the end of the treatment and has a harder time picking up his feet, tends to shuffle some.  OBJECTIVE IMPAIRMENTS Abnormal gait, decreased activity tolerance, decreased balance,  decreased cognition, decreased coordination, decreased knowledge of condition, decreased mobility, difficulty walking, decreased ROM, decreased strength, and decreased safety awareness.   ACTIVITY LIMITATIONS sitting, standing, transfers, bed mobility, bathing, toileting, dressing, and locomotion level  PARTICIPATION LIMITATIONS: cleaning, interpersonal relationship, community activity, and home maintenance  PERSONAL FACTORS Age, Behavior pattern, and 3+ comorbidities: frequent falls, hx of strokes, memory loss  are also affecting patient's functional outcome.   REHAB POTENTIAL: Fair age, low functional level, and cognitive factors may cause difficulty with treatment  CLINICAL DECISION MAKING: Evolving/moderate complexity  EVALUATION COMPLEXITY: Moderate  PLAN: PT FREQUENCY: 2x/week  PT DURATION: 12 weeks  PLANNED INTERVENTIONS: Therapeutic exercises, Therapeutic activity, Neuromuscular re-education, Balance training, Gait training, Patient/Family education, Joint mobilization, Stair training, Wheelchair mobility training, and Re-evaluation  PLAN FOR NEXT SESSION: continue to progress function and balance MiLum Babe  PT 03/01/22 11:02 AM

## 2022-03-08 ENCOUNTER — Ambulatory Visit: Payer: Medicare Other | Admitting: Physical Therapy

## 2022-03-08 ENCOUNTER — Encounter: Payer: Self-pay | Admitting: Physical Therapy

## 2022-03-08 DIAGNOSIS — Z8673 Personal history of transient ischemic attack (TIA), and cerebral infarction without residual deficits: Secondary | ICD-10-CM

## 2022-03-08 DIAGNOSIS — R2689 Other abnormalities of gait and mobility: Secondary | ICD-10-CM | POA: Diagnosis not present

## 2022-03-08 DIAGNOSIS — M6281 Muscle weakness (generalized): Secondary | ICD-10-CM

## 2022-03-08 DIAGNOSIS — R296 Repeated falls: Secondary | ICD-10-CM | POA: Diagnosis not present

## 2022-03-08 DIAGNOSIS — I6330 Cerebral infarction due to thrombosis of unspecified cerebral artery: Secondary | ICD-10-CM

## 2022-03-08 NOTE — Therapy (Signed)
OUTPATIENT PHYSICAL THERAPY TREATMENT    Patient Name: [name] MRN: [id number] DOB:May 25, 1928, 88 y.o., male Today's Date: 03/08/2022   PCP: [name] REFERRING PROVIDER: [name]   PT End of Session - 03/08/22 1351     Visit Number 16    Date for PT Re-Evaluation 03/23/22    Authorization Type [employer]    PT Start Time 3329    PT Stop Time 5188    PT Time Calculation (min) 50 min    Equipment Utilized During Treatment Gait belt    Activity Tolerance Patient tolerated treatment well    Behavior During Therapy WFL for tasks assessed/performed                    Past Medical History:  Diagnosis Date   Adenocarcinoma ([place]) 1985   colon s/p right hemicolectomy and chemotherapy. F/U with cancer center and Dr. Lucia Gaskins rotinely   Depression    Eczema    Epistaxis    f/u per ENT   Erectile dysfunction    Gastric ulcer 12/11   H-Pylori Tx, EGD 3-12: gastritis   Hay fever    Head injury 11/2017   with fall   Hyperlipemia    Hypertension    Hypothyroidism    Hypothyroid   Insomnia    Osteopenia     per DEXA 07-2008 (Rx fosamax)   Rheumatoid arthritis ([place])    Stroke ([place])     " balance issue"   Past Surgical History:  Procedure Laterality Date   COLON SURGERY     Right / Adenocarcinoma / Chemo   COLONOSCOPY  2005, 2008   Dr. Lucia Gaskins   INGUINAL HERNIA REPAIR     Right   IR ANGIO INTRA EXTRACRAN SEL COM CAROTID INNOMINATE BILAT MOD SED  12/13/2017   IR ANGIO VERTEBRAL SEL VERTEBRAL BILAT MOD SED  12/13/2017   IR PTA INTRACRANIAL  12/18/2017   RADIOLOGY WITH ANESTHESIA N/A 12/18/2017   Procedure: Angioplasty with stenting;  Surgeon: Luanne Bras, MD;  Location: [facility];  Service: Radiology;  Laterality: N/A;   Patient Active Problem List   Diagnosis Date Noted   Requires assistance with activities of daily living (ADL) 10/11/2021   Bronchitis 10/11/2021   Trapezium fracture 09/09/2021   CMC arthritis, thumb, degenerative 09/09/2021    Left wrist pain 09/05/2021   Radial nerve palsy, left 09/05/2021   Acute shoulder pain due to trauma, left 07/29/2021   Slow transit constipation    Stroke (cerebrum) ([place]) 07/07/2021   CVA (cerebral vascular accident) [facility]) with intracranial vascular stenosis 06/29/2021   Intracranial vascular stenosis 06/29/2021   Prediabetes 06/29/2021   Normocytic anemia 06/29/2021   History of stroke 06/29/2021   Other drug-induced neutropenia ([place]) 06/09/2021   Malignant neoplasm of ascending colon ([place]) 06/09/2021   Stenosis of artery ([place]) 06/09/2021   Preventative health care 03/01/2021   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   Kidney disease, chronic, stage III (moderate, EGFR 30-59 ml/min) ([place]) 08/07/2020   Leukopenia 08/07/2020   Acute respiratory failure with hypoxia ([place]) 08/07/2020   SIRS (systemic inflammatory response syndrome) ([place]) 08/07/2020   Methotrexate adverse reaction, initial encounter    Acute pain of right shoulder 04/14/2020   Lower extremity edema 11/13/2019   Head trauma 11/13/2019   Dyslipidemia 11/13/2019   Memory loss 07/28/2019   Chronic right shoulder pain 07/28/2019   Frequent falls 07/28/2019   Balance problem 07/28/2019   Dysphagia 07/28/2019   Traumatic hemorrhage of cerebrum, unspecified, without  loss of consciousness, initial encounter [facility])    Basilar artery stenosis with infarction ([place]) 12/18/2017   TIA (transient ischemic attack) 12/10/2017   Rheumatoid arthritis ([place])    Chest pain 06/02/2011   General medical examination 04/11/2011   PEPTIC ULCER DISEASE, HELICOBACTER PYLORI POSITIVE 07/12/2010   PERSONAL HISTORY MALIG NEOPLASM LARGE INTESTINE 05/18/2010   MITRAL VALVE DISORDERS 04/05/2009   HOARSENESS 12/04/2008   OSTEOPENIA 08/26/2008   HYPERTRIGLYCERIDEMIA 07/23/2008   EPISTAXIS 07/26/2007   ERECTILE DYSFUNCTION 02/22/2007   ECZEMA 12/07/2006   LEG CRAMPS 10/09/2006   Hypothyroidism 08/28/2006   Essential hypertension 08/28/2006   HAY  FEVER 08/28/2006   INSOMNIA 08/28/2006    ONSET DATE: 07/07/2021  REFERRING DIAG: O27.03  THERAPY DIAG:  Balance problem  Frequent falls  History of stroke  Cerebrovascular accident (CVA) due to thrombosis of cerebral artery ([place])  Other abnormalities of gait and mobility  Muscle weakness (generalized)  Rationale for Evaluation and Treatment Rehabilitation  SUBJECTIVE:                                                                                                                                                                                              SUBJECTIVE STATEMENT:  No falls, no pain, caregiver had him walk in  Pt accompanied by: interpreter  PERTINENT HISTORY: colon surgery, head inury 11/2017, requires assistance w/ADLs, bronchitis 10/11/21, L radial nerve palsy 08/2021, hx of stroke and CVA, stage 3 kidney disease, memory loss, frequent falls (the most recent being 08/27/21 and he hurt his shoulder)  PAIN:  Are you having pain? No  PRECAUTIONS: Fall  WEIGHT BEARING RESTRICTIONS No  FALLS: Has patient fallen in last 6 months? Yes. 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, historic buildings and History of cognitive impairments - at baseline Hx of intermittent confusion, unable to answer questions with full understanding   LOWER EXTREMITY ROM:   no objective measurements taken, but noticeable hip tightness with gait, and IR/ER tightness when tested in wheelchair  Active  Right Eval Left Eval   Hip flexion    Hip extension    Hip abduction    Hip adduction    Hip internal rotation  Hip external rotation    Knee flexion    Knee extension    Ankle dorsiflexion    Ankle plantarflexion    Ankle inversion    Ankle eversion     (Blank rows = not tested)  LOWER EXTREMITY MMT:    MMT Right Eval Left Eval  Hip flexion 4- 4-  Hip extension    Hip abduction 3+ 3+  Hip adduction 4 4  Hip internal rotation    Hip external rotation    Knee flexion 4- 4-  Knee extension 4- 4-  Ankle dorsiflexion 4 4  Ankle plantarflexion 4 4  Ankle inversion    Ankle eversion    (Blank rows = not tested)  UPPER EXTREMITY ROM:     Active  Right Eval Left Eval  Shoulder flexion 125 110  Shoulder extension    Shoulder abduction 73 90  Shoulder adduction    Shoulder IR    Shoulder ER [facility] WFL  Elbow flexion WNL WNL  Elbow extension WNL WNL   (Blank rows = not tested)  UPPER EXTREMITY MMT:     MMT Right Eval Left Eval  Shoulder flexion 3 3  Shoulder extension    Shoulder abduction 2+ 2+  Shoulder adduction    Shoulder IR 3+ 3+  Shoulder ER 3+ 3+  Elbow flexion 4- 4-  Elbow extension 4- 4-  (Blank rows = not tested)  BED MOBILITY:  TBD  TRANSFERS: Assistive device utilized: Environmental consultant - 2 wheeled and Wheelchair (manual)  Sit to stand: Mod A Stand to sit: Mod A Patient has a tendency to push backwards with transfers  GAIT: Gait pattern: step to pattern, decreased step length- Right, decreased step length- Left, decreased stride length, shuffling, ataxic, trendelenburg, decreased trunk rotation, trunk flexed, and wide BOS Distance walked: 47f Assistive device utilized: Walker - 2 wheeled Level of assistance: Mod A Comments: modA to stand from w/c and walk 161f unsteadiness on feet especially with turns, slow gait, wide BOS  FUNCTIONAL TESTs:  5 times sit to stand: TBD (unable to do w/o modA) Timed up and go (TUG): 41m64m30sec w/modA-maxA   TODAY'S TREATMENT:   03/08/22 Nustep level 5 x 6 minutes LEg curls 15# 3x10 Leg extension 5# 3x10 5# straight arm pulls, rows x 10 each Leg press 20# 3x10 Seated volley ball Standing CGA/min A ball toss and catch 4" toe touches 2x10  with SPC and CGA HHA gait x 100 feet Gait with FWW x 200 feet CGA   03/01/22 Nustep level 5 x 6 minutes Leg curls 15# 2x10 Leg extension 5# Standing red tband rows and extension 2.5# marching and hip abduction in Pbars Standing ball toss Standing toss of 1# and 1.5# cuff weights into box Walking with FWW, CGA and some assist with the navigation 200' x 2   02/20/22 Standing in Pbars 2.5# marches, hip abduction and extension 2x10 Nu step L5 x 6 minutes Leg ext 5# 3x10 Leg curls 15# 3x10 Gait with FWW 220 feet with CGA, then 120 feet Standing ball toss with CGA, then him throwing ball into a box  02/14/22 Nustep Level 5 x 6 minutes Leg ext 5# 3x10 Leg curls 15# 3x10 Side stepping, high knees and hip abduction in the Pbars Standing ball toss Sit to stand Standing reaching Seated volley ball Gait with HHA and then with FWW x150 ' each  02/08/22 NuStep x 6 minutes, L5, including 30 sec fast and 30 sec against increased resistance, U and LE.  Practiced forward weight shifts, then sit to stand x 5 minutes with education for position of head, chest, and hands and feet. Initially min a, progressed to CG-S. Postural training and balance training, including step back with rotation and B side lunges, trying to speed up movements. Standing shoulder horizontal abd with yellow Tband and diagonals for balance and posture Ambulated x 60' while holding 1# dumbell in BUE, no AD, CGA, VC for posture and step length.  01/25/22 NuStep U and LE, L3 x 5 minutes Seated trunk stability, had difficulty understanding opposite arm and leg raise Seated reach up and reach into diagonals with 1# ball in BUE Seated forward weight shifts with hands on RW, push it forward x 3. Last one, moved  sit to stand Sit to stand x 10 reps. Standing reach up and diagonals with 1# ball, 5 each. Standing reach up and back with trunk rotation, 5 to each side. *Therapist facilitated upright posture and active trunk with each exercise. Ambulation with shopping cart x 70', CGA.   PATIENT EDUCATION: Education details: POC  Person educated: Patient and Caregiver Education method: Explanation, Demonstration, Corporate treasurer cues, and Verbal cues Education comprehension: returned demonstration, verbal cues required, tactile cues required, and needs further education   HOME EXERCISE PROGRAM: Access Code: [email or web address]    GOALS: Goals reviewed with patient? Yes  SHORT TERM GOALS: Target date: 11/30/21  Patient will complete HEP Baseline: Goal status: met    LONG TERM GOALS: Target date: 02/16/22  Patient will demonstrate 4/5 strength in UE to be able to complete ADLs with minA Baseline:  Goal status: on going  2.  Patient will ambulate 46f with minA or better to be able to navigate inside home Baseline:  Goal status:partially met  3.  Patient will demonstrate >= 4/5 strength in LE to be able to complete transfers with less assistance and become household ambulator Baseline:  Goal status: ongoing  4.  Patient will score <177m on TUG using rolling walker and minA Baseline:  Goal status:partially met   ASSESSMENT:  CLINICAL IMPRESSION:  Patient performed well today, I added the leg press, most difficult thing is getting on and off, some cues to go slow and controlled.  He did well on this.  I continue to work on balance, strength and function  OBJECTIVE IMPAIRMENTS Abnormal gait, decreased activity tolerance, decreased balance, decreased cognition, decreased coordination, decreased knowledge of condition, decreased mobility, difficulty walking, decreased ROM, decreased strength, and decreased safety awareness.   ACTIVITY LIMITATIONS sitting, standing, transfers, bed mobility, bathing,  toileting, dressing, and locomotion level  PARTICIPATION LIMITATIONS: cleaning, interpersonal relationship, community activity, and home maintenance  PERSONAL FACTORS Age, Behavior pattern, and 3+ comorbidities: frequent falls, hx of strokes, memory loss  are also affecting patient's functional outcome.   REHAB POTENTIAL: Fair age, low functional level, and cognitive factors may cause difficulty with treatment  CLINICAL DECISION MAKING: Evolving/moderate complexity  EVALUATION COMPLEXITY: Moderate  PLAN: PT FREQUENCY: 2x/week  PT DURATION: 12 weeks  PLANNED INTERVENTIONS: Therapeutic exercises, Therapeutic activity, Neuromuscular re-education, Balance training, Gait training, Patient/Family education, Joint mobilization, Stair training, Wheelchair mobility training, and Re-evaluation  PLAN FOR NEXT SESSION: continue to progress function and balance MiLum BabePT 03/08/22 1:51 PM

## 2022-03-14 ENCOUNTER — Ambulatory Visit: Payer: Medicare Other | Admitting: Physical Therapy

## 2022-03-14 ENCOUNTER — Encounter: Payer: Self-pay | Admitting: Physical Therapy

## 2022-03-14 DIAGNOSIS — M6281 Muscle weakness (generalized): Secondary | ICD-10-CM | POA: Diagnosis not present

## 2022-03-14 DIAGNOSIS — Z8673 Personal history of transient ischemic attack (TIA), and cerebral infarction without residual deficits: Secondary | ICD-10-CM | POA: Diagnosis not present

## 2022-03-14 DIAGNOSIS — R296 Repeated falls: Secondary | ICD-10-CM | POA: Diagnosis not present

## 2022-03-14 DIAGNOSIS — R2689 Other abnormalities of gait and mobility: Secondary | ICD-10-CM | POA: Diagnosis not present

## 2022-03-14 DIAGNOSIS — I6330 Cerebral infarction due to thrombosis of unspecified cerebral artery: Secondary | ICD-10-CM | POA: Diagnosis not present

## 2022-03-14 NOTE — Therapy (Signed)
OUTPATIENT PHYSICAL THERAPY TREATMENT    Patient Name: [name] MRN: [id number] DOB:03-01-1929, 88 y.o., male Today's Date: 03/14/2022   PCP: [name] REFERRING PROVIDER: [name]   PT End of Session - 03/14/22 1359     Visit Number 17    Date for PT Re-Evaluation 03/23/22    Authorization Type [employer] MC    PT Start Time 7017    PT Stop Time 7939    PT Time Calculation (min) 47 min    Equipment Utilized During Treatment Gait belt    Activity Tolerance Patient tolerated treatment well    Behavior During Therapy WFL for tasks assessed/performed                    Past Medical History:  Diagnosis Date   Adenocarcinoma ([place]) 1985   colon s/p right hemicolectomy and chemotherapy. F/U with cancer center and Dr. Lucia Gaskins rotinely   Depression    Eczema    Epistaxis    f/u per ENT   Erectile dysfunction    Gastric ulcer 12/11   H-Pylori Tx, EGD 3-12: gastritis   Hay fever    Head injury 11/2017   with fall   Hyperlipemia    Hypertension    Hypothyroidism    Hypothyroid   Insomnia    Osteopenia     per DEXA 07-2008 (Rx fosamax)   Rheumatoid arthritis ([place])    Stroke ([place])     " balance issue"   Past Surgical History:  Procedure Laterality Date   COLON SURGERY     Right / Adenocarcinoma / Chemo   COLONOSCOPY  2005, 2008   Dr. Lucia Gaskins   INGUINAL HERNIA REPAIR     Right   IR ANGIO INTRA EXTRACRAN SEL COM CAROTID INNOMINATE BILAT MOD SED  12/13/2017   IR ANGIO VERTEBRAL SEL VERTEBRAL BILAT MOD SED  12/13/2017   IR PTA INTRACRANIAL  12/18/2017   RADIOLOGY WITH ANESTHESIA N/A 12/18/2017   Procedure: Angioplasty with stenting;  Surgeon: Luanne Bras, MD;  Location: [place];  Service: Radiology;  Laterality: N/A;   Patient Active Problem List   Diagnosis Date Noted   Requires assistance with activities of daily living (ADL) 10/11/2021   Bronchitis 10/11/2021   Trapezium fracture 09/09/2021   CMC arthritis, thumb, degenerative 09/09/2021    Left wrist pain 09/05/2021   Radial nerve palsy, left 09/05/2021   Acute shoulder pain due to trauma, left 07/29/2021   Slow transit constipation    Stroke (cerebrum) ([place]) 07/07/2021   CVA (cerebral vascular accident) [facility]) with intracranial vascular stenosis 06/29/2021   Intracranial vascular stenosis 06/29/2021   Prediabetes 06/29/2021   Normocytic anemia 06/29/2021   History of stroke 06/29/2021   Other drug-induced neutropenia ([place]) 06/09/2021   Malignant neoplasm of ascending colon ([place]) 06/09/2021   Stenosis of artery ([place]) 06/09/2021   Preventative health care 03/01/2021   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   Kidney disease, chronic, stage III (moderate, EGFR 30-59 ml/min) ([place]) 08/07/2020   Leukopenia 08/07/2020   Acute respiratory failure with hypoxia ([place]) 08/07/2020   SIRS (systemic inflammatory response syndrome) ([place]) 08/07/2020   Methotrexate adverse reaction, initial encounter    Acute pain of right shoulder 04/14/2020   Lower extremity edema 11/13/2019   Head trauma 11/13/2019   Dyslipidemia 11/13/2019   Memory loss 07/28/2019   Chronic right shoulder pain 07/28/2019   Frequent falls 07/28/2019   Balance problem 07/28/2019   Dysphagia 07/28/2019   Traumatic hemorrhage of cerebrum, unspecified, without  loss of consciousness, initial encounter [facility])    Basilar artery stenosis with infarction ([place]) 12/18/2017   TIA (transient ischemic attack) 12/10/2017   Rheumatoid arthritis ([place])    Chest pain 06/02/2011   General medical examination 04/11/2011   PEPTIC ULCER DISEASE, HELICOBACTER PYLORI POSITIVE 07/12/2010   PERSONAL HISTORY MALIG NEOPLASM LARGE INTESTINE 05/18/2010   MITRAL VALVE DISORDERS 04/05/2009   HOARSENESS 12/04/2008   OSTEOPENIA 08/26/2008   HYPERTRIGLYCERIDEMIA 07/23/2008   EPISTAXIS 07/26/2007   ERECTILE DYSFUNCTION 02/22/2007   ECZEMA 12/07/2006   LEG CRAMPS 10/09/2006   Hypothyroidism 08/28/2006   Essential hypertension 08/28/2006   HAY  FEVER 08/28/2006   INSOMNIA 08/28/2006    ONSET DATE: 07/07/2021  REFERRING DIAG: G62.69  THERAPY DIAG:  Balance problem  Frequent falls  History of stroke  Cerebrovascular accident (CVA) due to thrombosis of cerebral artery (HCC)  Other abnormalities of gait and mobility  Muscle weakness (generalized)  Rationale for Evaluation and Treatment Rehabilitation  SUBJECTIVE:                                                                                                                                                                                              SUBJECTIVE STATEMENT:  Care giver reports that he has been doing well today, "stronger and moving better"  Pt accompanied by: interpreter  PERTINENT HISTORY: colon surgery, head inury 11/2017, requires assistance w/ADLs, bronchitis 10/11/21, L radial nerve palsy 08/2021, hx of stroke and CVA, stage 3 kidney disease, memory loss, frequent falls (the most recent being 08/27/21 and he hurt his shoulder)  PAIN:  Are you having pain? No  PRECAUTIONS: Fall  WEIGHT BEARING RESTRICTIONS No  FALLS: Has patient fallen in last 6 months? Yes. 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, historic buildings and History of cognitive impairments - at baseline Hx of intermittent confusion, unable to answer questions with full understanding   LOWER EXTREMITY ROM:   no objective measurements taken, but noticeable hip tightness with gait, and IR/ER tightness when tested in  wheelchair  Active  Right Eval Left Eval  Hip flexion    Hip extension    Hip abduction    Hip adduction  Hip internal rotation    Hip external rotation    Knee flexion    Knee extension    Ankle dorsiflexion    Ankle plantarflexion    Ankle inversion    Ankle eversion     (Blank rows = not tested)  LOWER EXTREMITY MMT:    MMT Right Eval Left Eval  Hip flexion 4- 4-  Hip extension    Hip abduction 3+ 3+  Hip adduction 4 4  Hip internal rotation    Hip external rotation    Knee flexion 4- 4-  Knee extension 4- 4-  Ankle dorsiflexion 4 4  Ankle plantarflexion 4 4  Ankle inversion    Ankle eversion    (Blank rows = not tested)  UPPER EXTREMITY ROM:     Active  Right Eval Left Eval  Shoulder flexion 125 110  Shoulder extension    Shoulder abduction 73 90  Shoulder adduction    Shoulder IR    Shoulder ER [facility] WFL  Elbow flexion WNL WNL  Elbow extension WNL WNL   (Blank rows = not tested)  UPPER EXTREMITY MMT:     MMT Right Eval Left Eval  Shoulder flexion 3 3  Shoulder extension    Shoulder abduction 2+ 2+  Shoulder adduction    Shoulder IR 3+ 3+  Shoulder ER 3+ 3+  Elbow flexion 4- 4-  Elbow extension 4- 4-  (Blank rows = not tested)  BED MOBILITY:  TBD  TRANSFERS: Assistive device utilized: Environmental consultant - 2 wheeled and Wheelchair (manual)  Sit to stand: Mod A Stand to sit: Mod A Patient has a tendency to push backwards with transfers  GAIT: Gait pattern: step to pattern, decreased step length- Right, decreased step length- Left, decreased stride length, shuffling, ataxic, trendelenburg, decreased trunk rotation, trunk flexed, and wide BOS Distance walked: 65f Assistive device utilized: Walker - 2 wheeled Level of assistance: Mod A Comments: modA to stand from w/c and walk 134f unsteadiness on feet especially with turns, slow gait, wide BOS  FUNCTIONAL TESTs:  5 times sit to stand: TBD (unable to do w/o modA) Timed up and go (TUG): 31m79m 30sec w/modA-maxA   TODAY'S TREATMENT:  03/14/22 Nustep level 5 x 7 minutes Ball toss Red tband row and extension standing Leg press 20# 2x10 Leg ext 5# 2x10 Leg curls 20# 2x10 Gait with FWW workoing on speed x 120'x2 Feet on ball K2C, trunk rotation, bridges and isometric abs Green tband clamshell    03/08/22 Nustep level 5 x 6 minutes LEg curls 15# 3x10 Leg extension 5# 3x10 5# straight arm pulls, rows x 10 each Leg press 20# 3x10 Seated volley ball Standing CGA/min A ball toss and catch 4" toe touches 2x10  with SPC and CGA HHA gait x 100 feet Gait with FWW x 200 feet CGA   03/01/22 Nustep level 5 x 6 minutes Leg curls 15# 2x10 Leg extension 5# Standing red tband rows and extension 2.5# marching and hip abduction in Pbars Standing ball toss Standing toss of 1# and 1.5# cuff weights into box Walking with FWW, CGA and some assist with the navigation 200' x 2   02/20/22 Standing in Pbars 2.5# marches, hip abduction and extension 2x10 Nu step L5 x 6 minutes Leg ext 5# 3x10 Leg curls 15# 3x10 Gait with FWW 220 feet with CGA, then 120 feet Standing ball toss with CGA, then him throwing ball into a box  02/14/22 Nustep Level 5 x 6 minutes Leg ext  5# 3x10 Leg curls 15# 3x10 Side stepping, high knees and hip abduction in the Pbars Standing ball toss Sit to stand Standing reaching Seated volley ball Gait with HHA and then with FWW x150 ' each  02/08/22 NuStep x 6 minutes, L5, including 30 sec fast and 30 sec against increased resistance, U and LE. Practiced forward weight shifts, then sit to stand x 5 minutes with education for position of head, chest, and hands and feet. Initially min a, progressed to CG-S. Postural training and balance training, including step back with rotation and B side lunges, trying to speed up movements. Standing shoulder horizontal abd with yellow Tband and diagonals for balance and posture Ambulated x 60' while holding 1# dumbell in  BUE, no AD, CGA, VC for posture and step length.  01/25/22 NuStep U and LE, L3 x 5 minutes Seated trunk stability, had difficulty understanding opposite arm and leg raise Seated reach up and reach into diagonals with 1# ball in BUE Seated forward weight shifts with hands on RW, push it forward x 3. Last one, moved sit to stand Sit to stand x 10 reps. Standing reach up and diagonals with 1# ball, 5 each. Standing reach up and back with trunk rotation, 5 to each side. *Therapist facilitated upright posture and active trunk with each exercise. Ambulation with shopping cart x 70', CGA.   PATIENT EDUCATION: Education details: POC  Person educated: Patient and Caregiver Education method: Explanation, Demonstration, Corporate treasurer cues, and Verbal cues Education comprehension: returned demonstration, verbal cues required, tactile cues required, and needs further education   HOME EXERCISE PROGRAM: Access Code: [email or web address]    GOALS: Goals reviewed with patient? Yes  SHORT TERM GOALS: Target date: 11/30/21  Patient will complete HEP Baseline: Goal status: met    LONG TERM GOALS: Target date: 02/16/22  Patient will demonstrate 4/5 strength in UE to be able to complete ADLs with minA Baseline:  Goal status: on going  2.  Patient will ambulate 34f with minA or better to be able to navigate inside home Baseline:  Goal status:partially met  3.  Patient will demonstrate >= 4/5 strength in LE to be able to complete transfers with less assistance and become household ambulator Baseline:  Goal status: ongoing  4.  Patient will score <121m on TUG using rolling walker and minA Baseline:  Goal status:partially met   ASSESSMENT:  CLINICAL IMPRESSION:  Patient continue to do the leg press and the LE exercises, he did well just some fatigue.  With the walker and some light push on it he can go at a very good pace.  HE does some times have difficulty with transfers and backing up  OBJECTIVE  IMPAIRMENTS Abnormal gait, decreased activity tolerance, decreased balance, decreased cognition, decreased coordination, decreased knowledge of condition, decreased mobility, difficulty walking, decreased ROM, decreased strength, and decreased safety awareness.   ACTIVITY LIMITATIONS sitting, standing, transfers, bed mobility, bathing, toileting, dressing, and locomotion level  PARTICIPATION LIMITATIONS: cleaning, interpersonal relationship, community activity, and home maintenance  PERSONAL FACTORS Age, Behavior pattern, and 3+ comorbidities: frequent falls, hx of strokes, memory loss  are also affecting patient's functional outcome.   REHAB POTENTIAL: Fair age, low functional level, and cognitive factors may cause difficulty with treatment  CLINICAL DECISION MAKING: Evolving/moderate complexity  EVALUATION COMPLEXITY: Moderate  PLAN: PT FREQUENCY: 2x/week  PT DURATION: 12 weeks  PLANNED INTERVENTIONS: Therapeutic exercises, Therapeutic activity, Neuromuscular re-education, Balance training, Gait training, Patient/Family education, Joint mobilization, Stair training, Wheelchair mobility training, and Re-evaluation  PLAN FOR NEXT SESSION: continue to progress function and balance Lum Babe, PT 03/14/22 1:59 PM

## 2022-04-02 ENCOUNTER — Other Ambulatory Visit: Payer: Self-pay | Admitting: Family Medicine

## 2022-04-02 DIAGNOSIS — E785 Hyperlipidemia, unspecified: Secondary | ICD-10-CM

## 2022-04-18 ENCOUNTER — Other Ambulatory Visit: Payer: Self-pay | Admitting: Physician Assistant

## 2022-04-19 ENCOUNTER — Ambulatory Visit (HOSPITAL_BASED_OUTPATIENT_CLINIC_OR_DEPARTMENT_OTHER): Payer: Medicare Other | Admitting: Cardiovascular Disease

## 2022-04-19 NOTE — Progress Notes (Deleted)
Cardiology Office Note   Date:  04/19/2022   ID:  [name], DOB [birth year], MRN [id number]  PCP:  Carollee Herter, Alferd Apa, DO  Cardiologist:   Skeet Latch, MD   No chief complaint on file.  History of Present Illness: [name] is a 88 y.o. male with stroke, hypertension, hyperlipidemia, colon cancer, and hypothyroidism here for follow-up.  He was first seen 09/2021 after he had a stroke.  He had previously had a stroke in 2019.  He was admitted 06/2021 with recurrent falls and left-sided weakness that have been ongoing for a few days.  MRI/MRI revealed small acute perforator infarct at the right corona radiata and extensive small vessel ischemic disease.  He had chronic small vessel infarcts of the cerebellum and subcortical brain.  He also had evidence of brain atrophy.  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  He had 50% stenosis of the left vertebral origin.  A 30-day monitor was recommended to evaluate for atrial fibrillation.  Aspirin was increased to 325 mg and clopidogrel was added.  They planned for 3 months of DAPT followed by clopidogrel alone.  He was noted to have left-sided weakness with ataxia and balance deficits as well as expressive deficits and delayed processing.  They recommended CIR at discharge.  Echo revealed LVEF 60 to 65% with grade 1 diastolic dysfunction.  They were unable to exclude a small PFO.  He wore a 30-day monitor 01/2022 that revealed occasional PACs and PVCs but no significant arrhythmias.  At the last visit he was struggling with frequent falls.  His blood pressure was above goal but he had permissive hypertension given his intracranial stenoses and frequent fall.  However, when he followed up with neurology they recommended a blood pressure goal less than 130/90.  Past Medical History:  Diagnosis Date   Adenocarcinoma ([place]) 1985   colon s/p right hemicolectomy and chemotherapy. F/U with cancer center and Dr.  Lucia Gaskins rotinely   Depression    Eczema    Epistaxis    f/u per ENT   Erectile dysfunction    Gastric ulcer 12/11   H-Pylori Tx, EGD 3-12: gastritis   Hay fever    Head injury 11/2017   with fall   Hyperlipemia    Hypertension    Hypothyroidism    Hypothyroid   Insomnia    Osteopenia     per DEXA 07-2008 (Rx fosamax)   Rheumatoid arthritis ([place])    Stroke ([place])     " balance issue"    Past Surgical History:  Procedure Laterality Date   COLON SURGERY     Right / Adenocarcinoma / Chemo   COLONOSCOPY  2005, 2008   Dr. Lucia Gaskins   INGUINAL HERNIA REPAIR     Right   IR ANGIO INTRA EXTRACRAN SEL COM CAROTID INNOMINATE BILAT MOD SED  12/13/2017   IR ANGIO VERTEBRAL SEL VERTEBRAL BILAT MOD SED  12/13/2017   IR PTA INTRACRANIAL  12/18/2017   RADIOLOGY WITH ANESTHESIA N/A 12/18/2017   Procedure: Angioplasty with stenting;  Surgeon: Luanne Bras, MD;  Location: [place];  Service: Radiology;  Laterality: N/A;     Current Outpatient Medications  Medication Sig Dispense Refill   acetaminophen (TYLENOL) 500 MG tablet Take 1 tablet (500 mg total) by mouth at bedtime. (Patient not taking: Reported on 01/25/2022) 100 tablet 0   aspirin EC 81 MG tablet Take 81 mg by mouth daily. Swallow whole.     atorvastatin (  LIPITOR) 40 MG tablet TAKE 1 TABLET BY MOUTH EVERY DAY 90 tablet 1   capsicum (ZOSTRIX) 0.075 % topical cream Apply 1 application. topically 4 (four) times daily -  with meals and at bedtime. Be sure to wear glove and do not touch eye, mouth or nose (any mucous membranes) Apply to left shoulder. 57 g 0   docusate sodium (COLACE) 100 MG capsule Take 2 capsules (200 mg total) by mouth daily. 60 capsule 0   doxazosin (CARDURA) 1 MG tablet Take 1 tablet (1 mg total) by mouth at bedtime. 90 tablet 3   famotidine (PEPCID) 40 MG tablet TAKE 1 TABLET BY MOUTH EVERY DAY 90 tablet 0   levothyroxine (SYNTHROID) 100 MCG tablet TAKE 1 TABLET BY MOUTH DAILY BEFORE BREAKFAST. 90 tablet 1   LORazepam  (ATIVAN) 0.5 MG tablet Take 1 tablet (0.5 mg total) by mouth 2 (two) times daily as needed for anxiety. 30 tablet 1   memantine (NAMENDA) 10 MG tablet Take 1 tablet (10 mg total) by mouth 2 (two) times daily. (Patient not taking: Reported on 01/25/2022) 60 tablet 3   mirabegron ER (MYRBETRIQ) 25 MG TB24 tablet Take 1 tablet (25 mg total) by mouth at bedtime. 90 tablet 3   Multiple Vitamins-Minerals (MULTIVITAMIN ADULT) CHEW Chew 2 each by mouth daily.     NONFORMULARY OR COMPOUNDED ITEM 4 wheeled walker   #1  dx frequent falls, weakness 1 each 0   QUEtiapine (SEROQUEL) 25 MG tablet TAKE 1 TABLET BY MOUTH EVERYDAY AT BEDTIME 90 tablet 1   No current facility-administered medications for this visit.    Allergies:   Methotrexate derivatives, Asa [aspirin], and Penicillin g    Social History:  The patient  reports that he has never smoked. He has never been exposed to tobacco smoke. He has never used smokeless tobacco. He reports that he does not drink alcohol and does not use drugs.   Family History:  The patient's family history is not on file.    ROS:  Please see the history of present illness.   Otherwise, review of systems are positive for none.   All other systems are reviewed and negative.    PHYSICAL EXAM: VS:  There were no vitals taken for this visit. , BMI There is no height or weight on file to calculate BMI. GENERAL:  Well appearing HEENT:  Pupils equal round and reactive, fundi not visualized, oral mucosa unremarkable NECK:  No jugular venous distention, waveform within normal limits, carotid upstroke brisk and symmetric, no bruits, no thyromegaly LUNGS:  Clear to auscultation bilaterally HEART:  RRR.  PMI not displaced or sustained,S1 and S2 within normal limits, no S3, no S4, no clicks, no rubs, no murmurs ABD:  Flat, positive bowel sounds normal in frequency in pitch, no bruits, no rebound, no guarding, no midline pulsatile mass, no hepatomegaly, no splenomegaly EXT:  2 plus  pulses throughout, no edema, no cyanosis no clubbing SKIN:  No rashes no nodules NEURO:  Cranial nerves II through XII grossly intact, motor grossly intact throughout [facility]:  Intermittently confused.  Answers questions appropriately but also talks bout remote events and confuses episodes   EKG:  EKG is not ordered today. The ekg ordered today demonstrates   Echo 06/29/21: IMPRESSIONS     1. Left ventricular ejection fraction, by estimation, is 60 to 65%. The  left ventricle has normal function. The left ventricle has no regional  wall motion abnormalities. Left ventricular diastolic parameters are  consistent with  Grade I diastolic  dysfunction (impaired relaxation).   2. Right ventricular systolic function is normal. The right ventricular  size is normal. Tricuspid regurgitation signal is inadequate for assessing  PA pressure.   3. The mitral valve is grossly normal. Trivial mitral valve  regurgitation.   4. The aortic valve is grossly normal. Aortic valve regurgitation is  mild.   5. The inferior vena cava is normal in size with greater than 50%  respiratory variability, suggesting right atrial pressure of 3 mmHg.   6. Cannot exclude a small PFO.   Brain/Neck MRI/MRA 06/2021: Brain MRI:   1. Small acute perforator infarct at the right corona radiata. 2. Extensive chronic small vessel ischemia. Chronic small vessel infarcts in the cerebellum and subcortical brain. 3. Brain atrophy. Intracranial MRA:   1. Advanced and diffuse atherosclerosis. 2. Advanced right M1 segment stenosis that is new from a 2020 CTA. 3. 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 Chronic high-grade narrowing in the left V4 segment and basilar, non progressed from 2020.   Neck MRA:   1. 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 2. 50% narrowing at the left vertebral origin, chronic when compared to 2020 CTA. 3. No stenosis of the cervical common and internal carotids.    Recent  Labs: 07/01/2021: Magnesium 2.2 12/03/2021: Platelets 197 12/15/2021: ALT 28; BUN 22; Creatinine, Ser 1.51; Hemoglobin 13.4; Potassium 4.0; Sodium 143; TSH 1.500    Lipid Panel    Component Value Date/Time   CHOL 133 11/08/2021 1513   TRIG 387.0 (H) 11/08/2021 1513   HDL 33.30 (L) 11/08/2021 1513   CHOLHDL 4 11/08/2021 1513   VLDL 77.4 (H) 11/08/2021 1513   LDLCALC 102 (H) 06/29/2021 2154   LDLDIRECT 51.0 11/08/2021 1513      Wt Readings from Last 3 Encounters:  02/14/22 119 lb 9.6 oz (54.3 kg)  12/15/21 123 lb (55.8 kg)  12/15/21 123 lb (55.8 kg)      ASSESSMENT AND PLAN:  No problem-specific Assessment & Plan notes found for this encounter.   Current medicines are reviewed at length with the patient today.  The patient does not have concerns regarding medicines.  The following changes have been made:  no change  Labs/ tests ordered today include:   No orders of the defined types were placed in this encounter.    Disposition:   FU with Mourad Cwikla C. Oval Linsey, MD, [facility] in 6 months.      Signed, Monnica Saltsman C. Oval Linsey, MD, [facility]  04/19/2022 8:28 AM    [facility]

## 2022-05-10 ENCOUNTER — Telehealth: Payer: Self-pay | Admitting: Family Medicine

## 2022-05-10 NOTE — Telephone Encounter (Signed)
Daughter, [name], calling with question regarding his medication. Please call [phone] to advise.

## 2022-05-10 NOTE — Telephone Encounter (Signed)
Daughter called to report that Mr. Minnich is doing well but sometimes he when his schedule is changed a little he will have more agitation and insomnia. She was wondering if it would be OK to give 2 tablets of quetiapine '25mg'$  = '50mg'$  at bedtime?  She also wanted to let Dr Carollee Herter know that she has moved Mr. Cornick to [place] to be closer to her. They would like to continue to see Dr Carollee Herter a few times per year but also asked if she had a recommendation for a provider in the [place] area they would connect with for acute needs.  Will froward message to PCP.

## 2022-05-11 NOTE — Telephone Encounter (Signed)
Carollee Herter, Alferd Apa, DO  Cherre Robins, RPH-CPP Caller: Unspecified Wilburn Mylar,  9:27 AM) [facility]---- Vernard Gambles, prior authorization  Provided information to patient's caregiver.

## 2022-07-21 ENCOUNTER — Other Ambulatory Visit: Payer: Self-pay | Admitting: Physician Assistant

## 2022-08-05 ENCOUNTER — Other Ambulatory Visit: Payer: Self-pay | Admitting: Family Medicine

## 2022-08-05 DIAGNOSIS — E039 Hypothyroidism, unspecified: Secondary | ICD-10-CM

## 2022-09-27 ENCOUNTER — Other Ambulatory Visit: Payer: Self-pay | Admitting: Family Medicine

## 2022-09-27 DIAGNOSIS — E785 Hyperlipidemia, unspecified: Secondary | ICD-10-CM

## 2023-03-03 ENCOUNTER — Other Ambulatory Visit: Payer: Self-pay | Admitting: Physician Assistant

## 2023-03-03 ENCOUNTER — Other Ambulatory Visit: Payer: Self-pay | Admitting: Family Medicine

## 2023-03-03 DIAGNOSIS — E039 Hypothyroidism, unspecified: Secondary | ICD-10-CM

## 2023-03-23 ENCOUNTER — Other Ambulatory Visit: Payer: Self-pay | Admitting: Family Medicine

## 2023-03-23 DIAGNOSIS — E785 Hyperlipidemia, unspecified: Secondary | ICD-10-CM

## 2023-06-06 ENCOUNTER — Other Ambulatory Visit: Payer: Self-pay | Admitting: Family Medicine

## 2023-06-06 ENCOUNTER — Other Ambulatory Visit: Payer: Self-pay | Admitting: Physician Assistant

## 2023-06-06 DIAGNOSIS — E039 Hypothyroidism, unspecified: Secondary | ICD-10-CM

## 2023-08-31 ENCOUNTER — Other Ambulatory Visit: Payer: Self-pay | Admitting: Family Medicine

## 2023-08-31 ENCOUNTER — Other Ambulatory Visit: Payer: Self-pay | Admitting: Physician Assistant

## 2023-08-31 DIAGNOSIS — E039 Hypothyroidism, unspecified: Secondary | ICD-10-CM

## 2023-09-26 ENCOUNTER — Other Ambulatory Visit: Payer: Self-pay | Admitting: Family Medicine

## 2023-09-26 DIAGNOSIS — E785 Hyperlipidemia, unspecified: Secondary | ICD-10-CM
